# Patient Record
Sex: Female | Born: 1935
Health system: Southern US, Community
[De-identification: ages and names within clinical notes are randomized; demographics above are authoritative.]

## PROBLEM LIST (undated history)

## (undated) DIAGNOSIS — I10 Essential (primary) hypertension: Secondary | ICD-10-CM

## (undated) DIAGNOSIS — Z86010 Personal history of colonic polyps: Secondary | ICD-10-CM

## (undated) DIAGNOSIS — K559 Vascular disorder of intestine, unspecified: Secondary | ICD-10-CM

## (undated) DIAGNOSIS — T783XXA Angioneurotic edema, initial encounter: Secondary | ICD-10-CM

## (undated) DIAGNOSIS — K648 Other hemorrhoids: Secondary | ICD-10-CM

## (undated) DIAGNOSIS — K589 Irritable bowel syndrome without diarrhea: Secondary | ICD-10-CM

## (undated) DIAGNOSIS — J449 Chronic obstructive pulmonary disease, unspecified: Secondary | ICD-10-CM

## (undated) DIAGNOSIS — K219 Gastro-esophageal reflux disease without esophagitis: Secondary | ICD-10-CM

## (undated) DIAGNOSIS — Z86718 Personal history of other venous thrombosis and embolism: Secondary | ICD-10-CM

## (undated) DIAGNOSIS — J45909 Unspecified asthma, uncomplicated: Secondary | ICD-10-CM

## (undated) DIAGNOSIS — L309 Dermatitis, unspecified: Secondary | ICD-10-CM

## (undated) DIAGNOSIS — K5730 Diverticulosis of large intestine without perforation or abscess without bleeding: Secondary | ICD-10-CM

## (undated) DIAGNOSIS — M4800 Spinal stenosis, site unspecified: Secondary | ICD-10-CM

## (undated) DIAGNOSIS — L509 Urticaria, unspecified: Secondary | ICD-10-CM

## (undated) DIAGNOSIS — E119 Type 2 diabetes mellitus without complications: Secondary | ICD-10-CM

## (undated) DIAGNOSIS — E785 Hyperlipidemia, unspecified: Secondary | ICD-10-CM

## (undated) DIAGNOSIS — K5790 Diverticulosis of intestine, part unspecified, without perforation or abscess without bleeding: Secondary | ICD-10-CM

## (undated) HISTORY — PX: TONSILLECTOMY: SUR1361

## (undated) HISTORY — DX: Irritable bowel syndrome, unspecified: K58.9

## (undated) HISTORY — PX: SEPTOPLASTY: SUR1290

## (undated) HISTORY — DX: Chronic obstructive pulmonary disease, unspecified: J44.9

## (undated) HISTORY — DX: Essential (primary) hypertension: I10

## (undated) HISTORY — DX: Angioneurotic edema, initial encounter: T78.3XXA

## (undated) HISTORY — DX: Personal history of colonic polyps: Z86.010

## (undated) HISTORY — DX: Other hemorrhoids: K64.8

## (undated) HISTORY — DX: Hyperlipidemia, unspecified: E78.5

## (undated) HISTORY — DX: Vascular disorder of intestine, unspecified: K55.9

## (undated) HISTORY — DX: Diverticulosis of large intestine without perforation or abscess without bleeding: K57.30

## (undated) HISTORY — PX: ADENOIDECTOMY: SUR15

## (undated) HISTORY — PX: BREAST BIOPSY: SHX20

## (undated) HISTORY — DX: Spinal stenosis, site unspecified: M48.00

## (undated) HISTORY — DX: Type 2 diabetes mellitus without complications: E11.9

## (undated) HISTORY — DX: Dermatitis, unspecified: L30.9

## (undated) HISTORY — DX: Urticaria, unspecified: L50.9

## (undated) HISTORY — DX: Personal history of other venous thrombosis and embolism: Z86.718

## (undated) HISTORY — DX: Gastro-esophageal reflux disease without esophagitis: K21.9

## (undated) HISTORY — DX: Diverticulosis of intestine, part unspecified, without perforation or abscess without bleeding: K57.90

---

## 1997-12-14 ENCOUNTER — Other Ambulatory Visit: Admission: RE | Admit: 1997-12-14 | Discharge: 1997-12-14 | Payer: Self-pay | Admitting: Obstetrics and Gynecology

## 1998-07-29 HISTORY — PX: COLONOSCOPY W/ POLYPECTOMY: SHX1380

## 1998-11-27 ENCOUNTER — Other Ambulatory Visit: Admission: RE | Admit: 1998-11-27 | Discharge: 1998-11-27 | Payer: Self-pay | Admitting: Obstetrics and Gynecology

## 1999-06-25 ENCOUNTER — Emergency Department (HOSPITAL_COMMUNITY): Admission: EM | Admit: 1999-06-25 | Discharge: 1999-06-25 | Payer: Self-pay | Admitting: Emergency Medicine

## 2000-02-18 ENCOUNTER — Emergency Department (HOSPITAL_COMMUNITY): Admission: EM | Admit: 2000-02-18 | Discharge: 2000-02-18 | Payer: Self-pay | Admitting: Emergency Medicine

## 2000-02-20 ENCOUNTER — Other Ambulatory Visit: Admission: RE | Admit: 2000-02-20 | Discharge: 2000-02-20 | Payer: Self-pay | Admitting: Obstetrics and Gynecology

## 2000-04-16 ENCOUNTER — Emergency Department (HOSPITAL_COMMUNITY): Admission: EM | Admit: 2000-04-16 | Discharge: 2000-04-16 | Payer: Self-pay | Admitting: Emergency Medicine

## 2000-04-16 ENCOUNTER — Encounter: Payer: Self-pay | Admitting: Emergency Medicine

## 2000-12-09 ENCOUNTER — Other Ambulatory Visit: Admission: RE | Admit: 2000-12-09 | Discharge: 2000-12-09 | Payer: Self-pay | Admitting: Obstetrics and Gynecology

## 2001-08-24 ENCOUNTER — Encounter: Payer: Self-pay | Admitting: Cardiovascular Disease

## 2001-08-24 ENCOUNTER — Encounter: Payer: Self-pay | Admitting: Emergency Medicine

## 2001-08-24 ENCOUNTER — Inpatient Hospital Stay (HOSPITAL_COMMUNITY): Admission: EM | Admit: 2001-08-24 | Discharge: 2001-08-25 | Payer: Self-pay | Admitting: Emergency Medicine

## 2002-01-15 ENCOUNTER — Encounter: Payer: Self-pay | Admitting: Emergency Medicine

## 2002-01-15 ENCOUNTER — Emergency Department (HOSPITAL_COMMUNITY): Admission: EM | Admit: 2002-01-15 | Discharge: 2002-01-15 | Payer: Self-pay | Admitting: Emergency Medicine

## 2002-02-16 ENCOUNTER — Other Ambulatory Visit: Admission: RE | Admit: 2002-02-16 | Discharge: 2002-02-16 | Payer: Self-pay | Admitting: *Deleted

## 2002-04-01 ENCOUNTER — Encounter: Admission: RE | Admit: 2002-04-01 | Discharge: 2002-04-01 | Payer: Self-pay | Admitting: Internal Medicine

## 2002-04-01 ENCOUNTER — Encounter: Payer: Self-pay | Admitting: Internal Medicine

## 2003-01-08 ENCOUNTER — Emergency Department (HOSPITAL_COMMUNITY): Admission: AD | Admit: 2003-01-08 | Discharge: 2003-01-08 | Payer: Self-pay

## 2003-03-08 ENCOUNTER — Other Ambulatory Visit: Admission: RE | Admit: 2003-03-08 | Discharge: 2003-03-08 | Payer: Self-pay | Admitting: Gynecology

## 2004-03-08 ENCOUNTER — Other Ambulatory Visit: Admission: RE | Admit: 2004-03-08 | Discharge: 2004-03-08 | Payer: Self-pay | Admitting: Gynecology

## 2004-07-29 HISTORY — PX: COLONOSCOPY W/ POLYPECTOMY: SHX1380

## 2005-01-21 ENCOUNTER — Emergency Department (HOSPITAL_COMMUNITY): Admission: EM | Admit: 2005-01-21 | Discharge: 2005-01-21 | Payer: Self-pay | Admitting: Emergency Medicine

## 2005-03-11 ENCOUNTER — Other Ambulatory Visit: Admission: RE | Admit: 2005-03-11 | Discharge: 2005-03-11 | Payer: Self-pay | Admitting: Gynecology

## 2005-03-29 HISTORY — PX: DILATION AND CURETTAGE OF UTERUS: SHX78

## 2005-04-15 ENCOUNTER — Ambulatory Visit: Payer: Self-pay | Admitting: Internal Medicine

## 2005-04-15 ENCOUNTER — Encounter: Admission: RE | Admit: 2005-04-15 | Discharge: 2005-04-15 | Payer: Self-pay | Admitting: Gynecology

## 2005-04-16 ENCOUNTER — Ambulatory Visit (HOSPITAL_COMMUNITY): Admission: RE | Admit: 2005-04-16 | Discharge: 2005-04-16 | Payer: Self-pay | Admitting: Gynecology

## 2005-04-16 ENCOUNTER — Encounter (INDEPENDENT_AMBULATORY_CARE_PROVIDER_SITE_OTHER): Payer: Self-pay | Admitting: *Deleted

## 2005-04-16 ENCOUNTER — Ambulatory Visit (HOSPITAL_BASED_OUTPATIENT_CLINIC_OR_DEPARTMENT_OTHER): Admission: RE | Admit: 2005-04-16 | Discharge: 2005-04-16 | Payer: Self-pay | Admitting: Gynecology

## 2005-05-02 ENCOUNTER — Ambulatory Visit: Payer: Self-pay | Admitting: Gastroenterology

## 2005-06-05 ENCOUNTER — Encounter (INDEPENDENT_AMBULATORY_CARE_PROVIDER_SITE_OTHER): Payer: Self-pay | Admitting: *Deleted

## 2005-06-05 ENCOUNTER — Emergency Department (HOSPITAL_COMMUNITY): Admission: EM | Admit: 2005-06-05 | Discharge: 2005-06-06 | Payer: Self-pay | Admitting: Emergency Medicine

## 2005-06-05 ENCOUNTER — Ambulatory Visit: Payer: Self-pay | Admitting: Gastroenterology

## 2005-06-05 DIAGNOSIS — Z860100 Personal history of colon polyps, unspecified: Secondary | ICD-10-CM

## 2005-06-05 HISTORY — DX: Personal history of colon polyps, unspecified: Z86.0100

## 2005-06-05 HISTORY — DX: Personal history of colonic polyps: Z86.010

## 2005-06-07 ENCOUNTER — Ambulatory Visit: Payer: Self-pay | Admitting: Gastroenterology

## 2005-06-07 ENCOUNTER — Emergency Department (HOSPITAL_COMMUNITY): Admission: EM | Admit: 2005-06-07 | Discharge: 2005-06-07 | Payer: Self-pay | Admitting: Emergency Medicine

## 2005-07-12 ENCOUNTER — Ambulatory Visit: Payer: Self-pay | Admitting: Internal Medicine

## 2005-07-24 ENCOUNTER — Encounter: Admission: RE | Admit: 2005-07-24 | Discharge: 2005-10-22 | Payer: Self-pay | Admitting: Internal Medicine

## 2005-07-29 HISTORY — PX: NEPHRECTOMY: SHX65

## 2005-11-18 ENCOUNTER — Ambulatory Visit: Payer: Self-pay | Admitting: Internal Medicine

## 2005-11-26 ENCOUNTER — Ambulatory Visit: Payer: Self-pay | Admitting: Internal Medicine

## 2006-03-31 ENCOUNTER — Emergency Department (HOSPITAL_COMMUNITY): Admission: EM | Admit: 2006-03-31 | Discharge: 2006-03-31 | Payer: Self-pay | Admitting: Emergency Medicine

## 2006-04-04 ENCOUNTER — Ambulatory Visit: Payer: Self-pay | Admitting: Internal Medicine

## 2006-04-07 ENCOUNTER — Ambulatory Visit: Payer: Self-pay | Admitting: Internal Medicine

## 2006-04-10 ENCOUNTER — Encounter: Admission: RE | Admit: 2006-04-10 | Discharge: 2006-04-10 | Payer: Self-pay | Admitting: Internal Medicine

## 2006-04-15 ENCOUNTER — Other Ambulatory Visit: Admission: RE | Admit: 2006-04-15 | Discharge: 2006-04-15 | Payer: Self-pay | Admitting: Gynecology

## 2006-04-17 ENCOUNTER — Ambulatory Visit: Payer: Self-pay | Admitting: Internal Medicine

## 2006-04-18 ENCOUNTER — Encounter: Admission: RE | Admit: 2006-04-18 | Discharge: 2006-04-18 | Payer: Self-pay | Admitting: Internal Medicine

## 2006-04-27 ENCOUNTER — Emergency Department (HOSPITAL_COMMUNITY): Admission: EM | Admit: 2006-04-27 | Discharge: 2006-04-27 | Payer: Self-pay | Admitting: *Deleted

## 2006-05-02 ENCOUNTER — Ambulatory Visit (HOSPITAL_COMMUNITY): Admission: RE | Admit: 2006-05-02 | Discharge: 2006-05-02 | Payer: Self-pay | Admitting: Urology

## 2006-06-09 ENCOUNTER — Ambulatory Visit: Payer: Self-pay | Admitting: Pulmonary Disease

## 2006-06-09 ENCOUNTER — Inpatient Hospital Stay (HOSPITAL_COMMUNITY): Admission: RE | Admit: 2006-06-09 | Discharge: 2006-06-14 | Payer: Self-pay | Admitting: Urology

## 2006-06-09 ENCOUNTER — Encounter (INDEPENDENT_AMBULATORY_CARE_PROVIDER_SITE_OTHER): Payer: Self-pay | Admitting: Specialist

## 2006-06-15 ENCOUNTER — Inpatient Hospital Stay (HOSPITAL_COMMUNITY): Admission: EM | Admit: 2006-06-15 | Discharge: 2006-06-21 | Payer: Self-pay | Admitting: Emergency Medicine

## 2006-06-16 ENCOUNTER — Ambulatory Visit: Payer: Self-pay | Admitting: Internal Medicine

## 2006-06-23 ENCOUNTER — Inpatient Hospital Stay (HOSPITAL_COMMUNITY): Admission: EM | Admit: 2006-06-23 | Discharge: 2006-06-28 | Payer: Self-pay | Admitting: Emergency Medicine

## 2006-06-24 ENCOUNTER — Encounter (INDEPENDENT_AMBULATORY_CARE_PROVIDER_SITE_OTHER): Payer: Self-pay | Admitting: Specialist

## 2006-07-01 ENCOUNTER — Ambulatory Visit: Payer: Self-pay | Admitting: Internal Medicine

## 2006-07-11 ENCOUNTER — Ambulatory Visit: Payer: Self-pay | Admitting: Internal Medicine

## 2006-07-15 ENCOUNTER — Ambulatory Visit: Payer: Self-pay | Admitting: Internal Medicine

## 2006-08-05 ENCOUNTER — Ambulatory Visit: Payer: Self-pay | Admitting: Internal Medicine

## 2006-08-05 LAB — CONVERTED CEMR LAB
Basophils Relative: 1 % (ref 0.0–1.0)
Eosinophil percent: 7.1 % — ABNORMAL HIGH (ref 0.0–5.0)
HCT: 44.1 % (ref 36.0–46.0)
Neutrophils Relative %: 54.9 % (ref 43.0–77.0)
Platelets: 392 10*3/uL (ref 150–400)
Potassium: 4.4 meq/L (ref 3.5–5.1)
RBC: 5.05 M/uL (ref 3.87–5.11)
RDW: 14.9 % — ABNORMAL HIGH (ref 11.5–14.6)
WBC: 6.6 10*3/uL (ref 4.5–10.5)

## 2006-08-26 ENCOUNTER — Ambulatory Visit: Payer: Self-pay | Admitting: Internal Medicine

## 2006-09-02 ENCOUNTER — Emergency Department (HOSPITAL_COMMUNITY): Admission: EM | Admit: 2006-09-02 | Discharge: 2006-09-02 | Payer: Self-pay | Admitting: Emergency Medicine

## 2006-09-03 DIAGNOSIS — K589 Irritable bowel syndrome without diarrhea: Secondary | ICD-10-CM

## 2006-09-03 DIAGNOSIS — I10 Essential (primary) hypertension: Secondary | ICD-10-CM | POA: Insufficient documentation

## 2006-09-03 DIAGNOSIS — E785 Hyperlipidemia, unspecified: Secondary | ICD-10-CM | POA: Insufficient documentation

## 2006-09-03 DIAGNOSIS — K5730 Diverticulosis of large intestine without perforation or abscess without bleeding: Secondary | ICD-10-CM | POA: Insufficient documentation

## 2006-09-16 ENCOUNTER — Ambulatory Visit: Payer: Self-pay | Admitting: Internal Medicine

## 2006-10-01 ENCOUNTER — Ambulatory Visit: Payer: Self-pay | Admitting: Internal Medicine

## 2006-10-15 ENCOUNTER — Ambulatory Visit: Payer: Self-pay | Admitting: Internal Medicine

## 2006-11-06 IMAGING — CT CT HEAD W/O CM
1 series · 16 of 30 positions shown, 20 images · IV contrast (agent unspecified)
Comparison: 01/21/2005

CLINICAL DATA: Left-sided numbness

HEAD CT WITHOUT CONTRAST:
TECHNIQUE: 5mm collimated images were obtained from the base of the skull
through the vertex according to standard protocol without contrast.

[Series 2: head_seq 4.5 h45s st · axial · 0.43mm/px · z∈[+1224,+1350]mm · 16 of 32 slices shown, 20 images]
[im 2/32  brain]
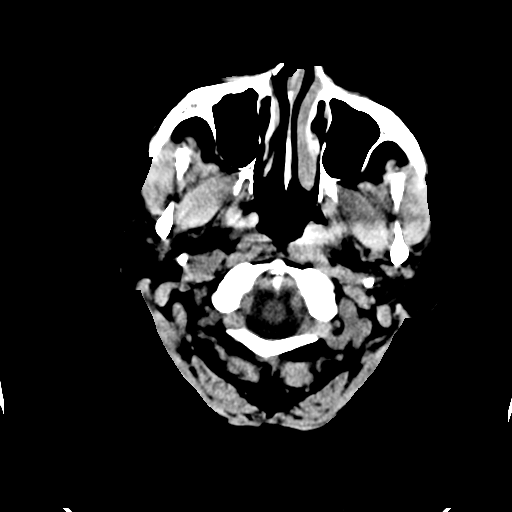
[im 2/32  bone]
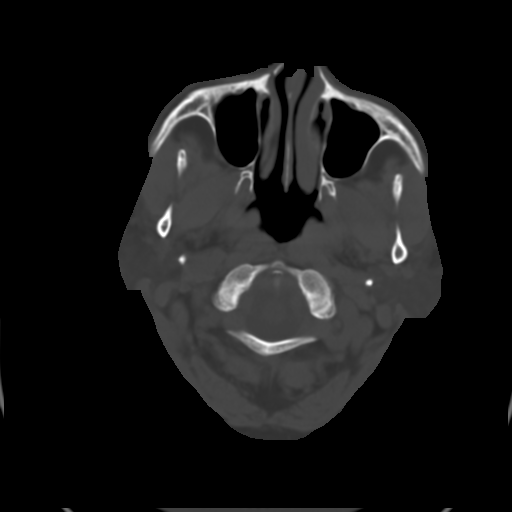
[im 4/32  brain]
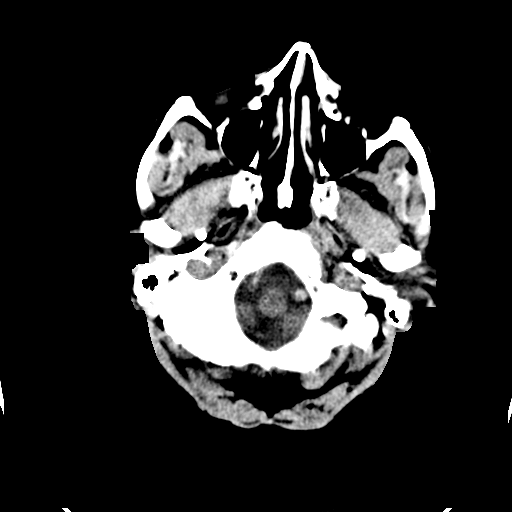
[im 6/32  brain]
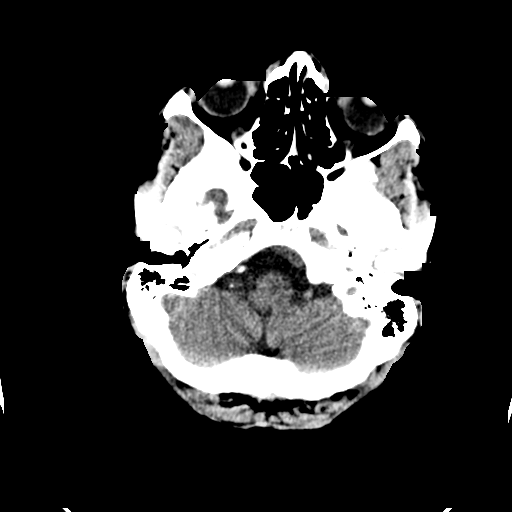
[im 8/32  brain]
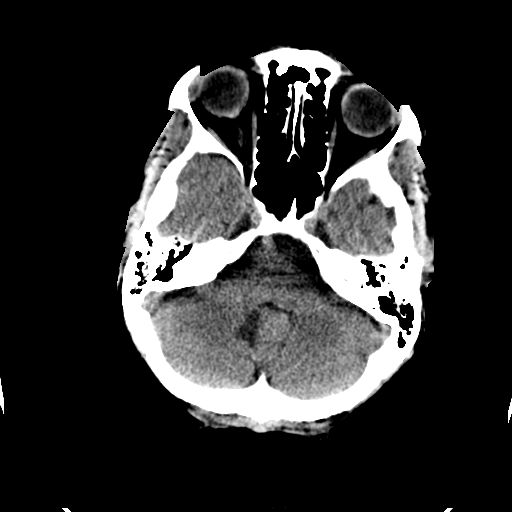
[im 9/32  brain]
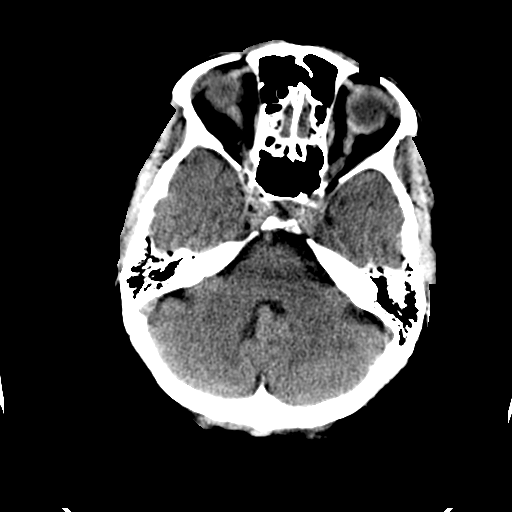
[im 9/32  bone]
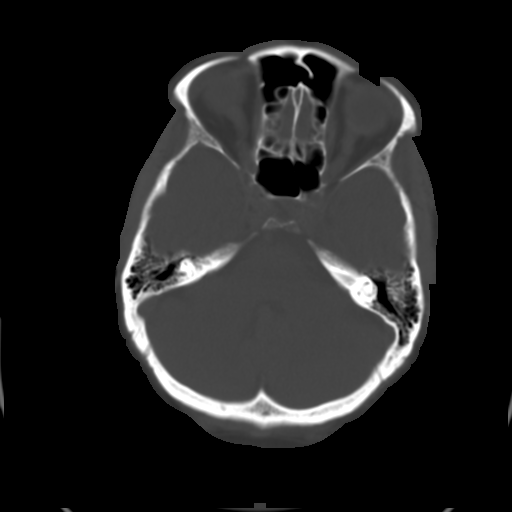
[im 11/32  brain]
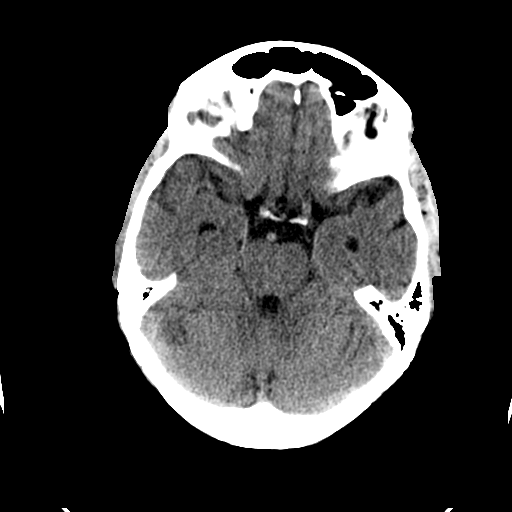
[im 13/32  brain]
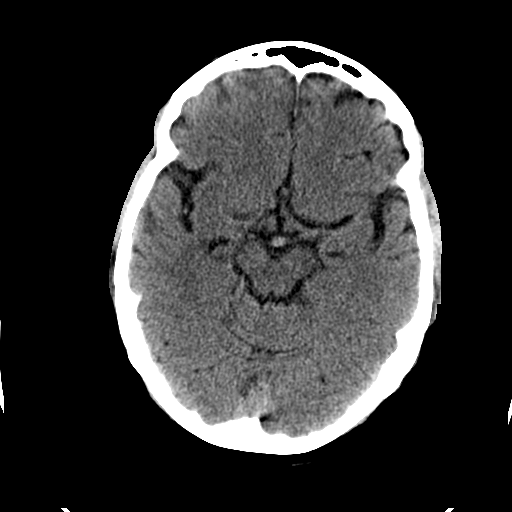
[im 15/32  brain]
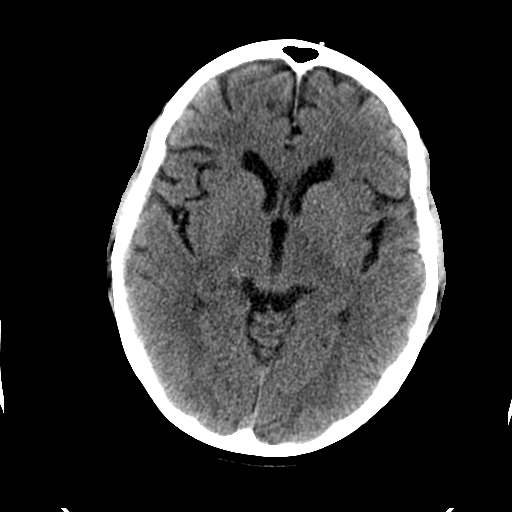
[im 17/32  brain]
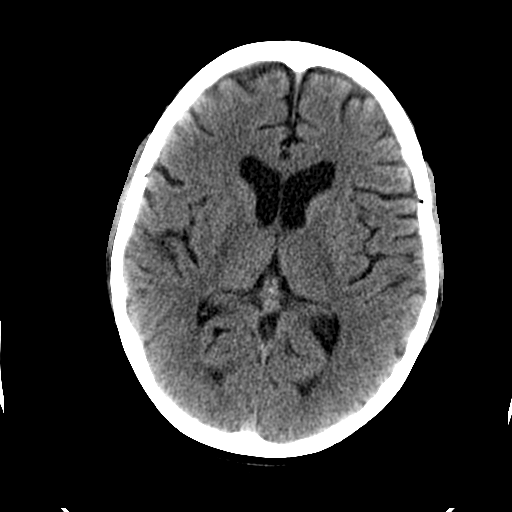
[im 17/32  bone]
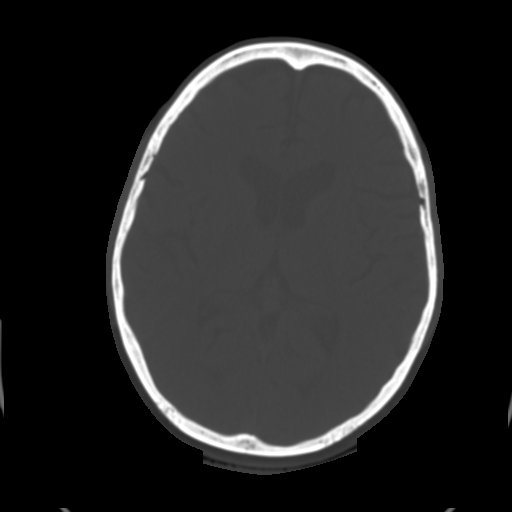
[im 19/32  brain]
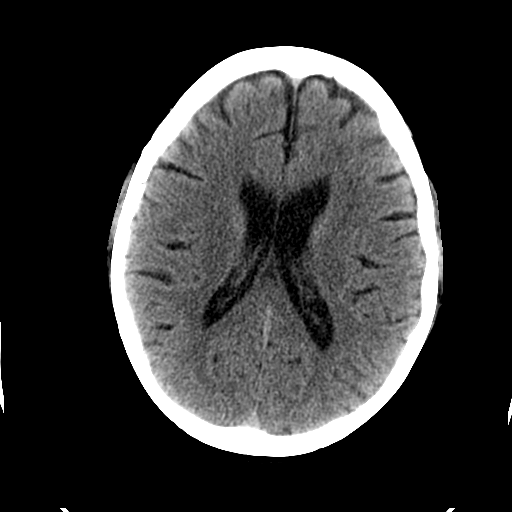
[im 21/32  brain]
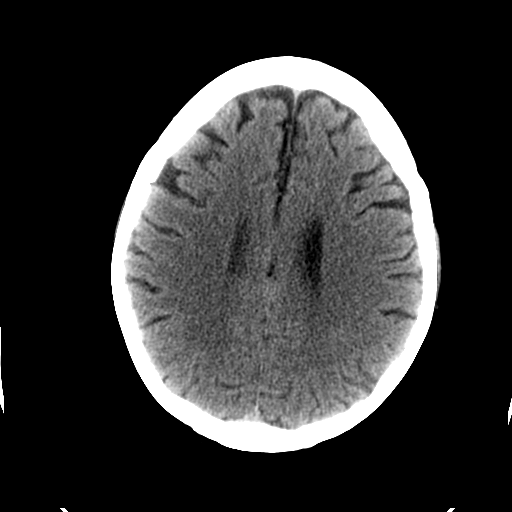
[im 23/32  brain]
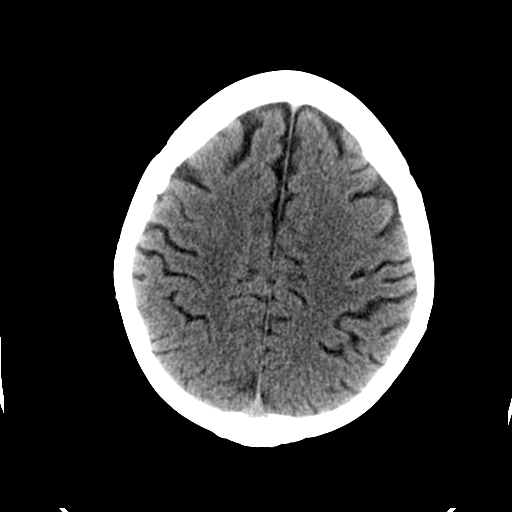
[im 24/32  brain]
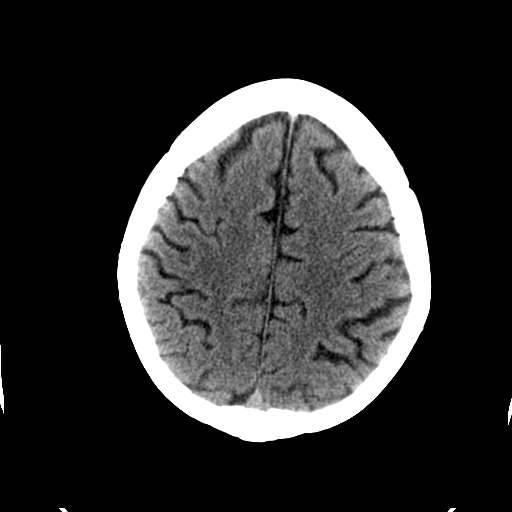
[im 24/32  bone]
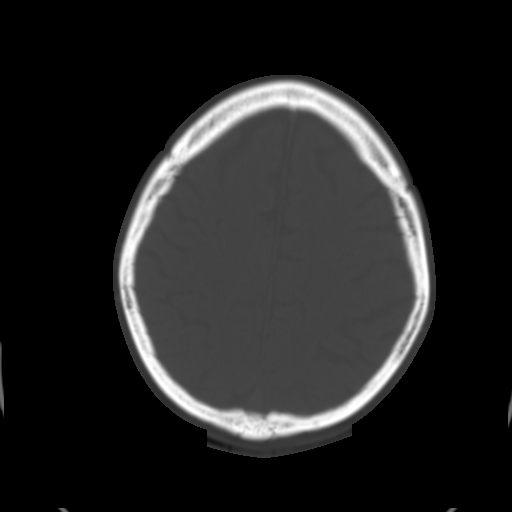
[im 26/32  brain]
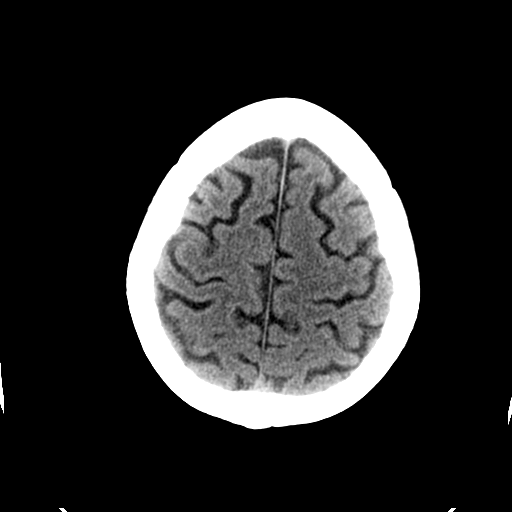
[im 28/32  brain]
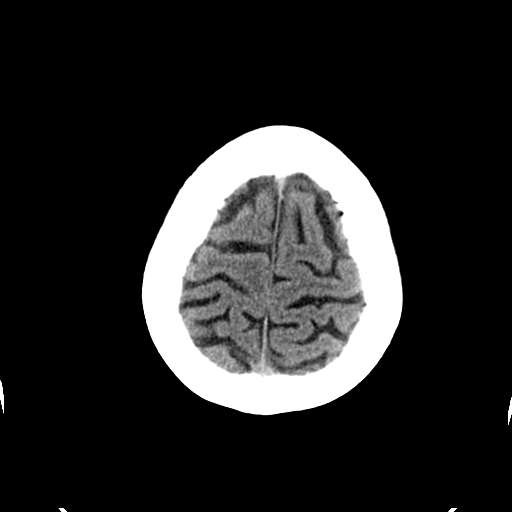
[im 30/32  brain]
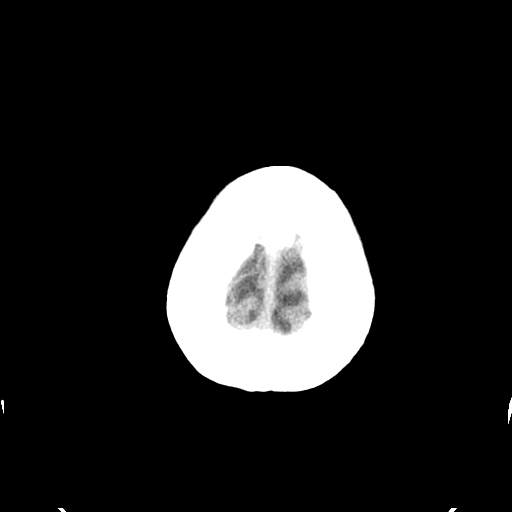

[16 of 30 positions shown; findings below may reference images not displayed]

FINDINGS: There is no evidence of intracranial hemorrhage, hydrocephalus, mass
lesion, or acute infarction.  No abnormal extra-axial fluid collections
identified.  No skull abnormalities are noted.
IMPRESSION: No acute intracranial abnormality.

## 2006-11-09 ENCOUNTER — Ambulatory Visit: Payer: Self-pay | Admitting: Internal Medicine

## 2006-11-09 ENCOUNTER — Inpatient Hospital Stay (HOSPITAL_COMMUNITY): Admission: EM | Admit: 2006-11-09 | Discharge: 2006-11-11 | Payer: Self-pay | Admitting: Emergency Medicine

## 2006-11-18 ENCOUNTER — Ambulatory Visit: Payer: Self-pay | Admitting: Gastroenterology

## 2006-11-18 ENCOUNTER — Ambulatory Visit: Payer: Self-pay | Admitting: Internal Medicine

## 2006-12-23 ENCOUNTER — Ambulatory Visit: Payer: Self-pay | Admitting: Internal Medicine

## 2006-12-25 ENCOUNTER — Encounter: Payer: Self-pay | Admitting: Internal Medicine

## 2007-01-23 ENCOUNTER — Encounter: Payer: Self-pay | Admitting: Internal Medicine

## 2007-03-02 ENCOUNTER — Ambulatory Visit: Payer: Self-pay | Admitting: Internal Medicine

## 2007-03-02 DIAGNOSIS — M2550 Pain in unspecified joint: Secondary | ICD-10-CM | POA: Insufficient documentation

## 2007-03-17 ENCOUNTER — Ambulatory Visit (HOSPITAL_COMMUNITY): Admission: RE | Admit: 2007-03-17 | Discharge: 2007-03-17 | Payer: Self-pay | Admitting: Urology

## 2007-03-24 ENCOUNTER — Encounter: Payer: Self-pay | Admitting: Internal Medicine

## 2007-04-02 ENCOUNTER — Ambulatory Visit: Payer: Self-pay | Admitting: Internal Medicine

## 2007-04-02 ENCOUNTER — Observation Stay (HOSPITAL_COMMUNITY): Admission: EM | Admit: 2007-04-02 | Discharge: 2007-04-04 | Payer: Self-pay | Admitting: Emergency Medicine

## 2007-04-07 ENCOUNTER — Ambulatory Visit: Payer: Self-pay | Admitting: Internal Medicine

## 2007-04-08 LAB — CONVERTED CEMR LAB
Sed Rate: 11 mm/hr (ref 0–25)
Uric Acid, Serum: 6.7 mg/dL (ref 2.4–7.0)

## 2007-04-09 ENCOUNTER — Encounter (INDEPENDENT_AMBULATORY_CARE_PROVIDER_SITE_OTHER): Payer: Self-pay | Admitting: *Deleted

## 2007-04-17 ENCOUNTER — Telehealth (INDEPENDENT_AMBULATORY_CARE_PROVIDER_SITE_OTHER): Payer: Self-pay | Admitting: *Deleted

## 2007-04-22 ENCOUNTER — Other Ambulatory Visit: Admission: RE | Admit: 2007-04-22 | Discharge: 2007-04-22 | Payer: Self-pay | Admitting: Gynecology

## 2007-04-22 ENCOUNTER — Encounter: Payer: Self-pay | Admitting: Internal Medicine

## 2007-06-02 ENCOUNTER — Encounter: Payer: Self-pay | Admitting: Internal Medicine

## 2007-06-19 ENCOUNTER — Telehealth (INDEPENDENT_AMBULATORY_CARE_PROVIDER_SITE_OTHER): Payer: Self-pay | Admitting: *Deleted

## 2007-06-29 ENCOUNTER — Telehealth (INDEPENDENT_AMBULATORY_CARE_PROVIDER_SITE_OTHER): Payer: Self-pay | Admitting: *Deleted

## 2007-07-02 ENCOUNTER — Ambulatory Visit: Payer: Self-pay | Admitting: Internal Medicine

## 2007-07-10 ENCOUNTER — Telehealth: Payer: Self-pay | Admitting: *Deleted

## 2007-07-14 ENCOUNTER — Telehealth: Payer: Self-pay | Admitting: Internal Medicine

## 2007-07-14 ENCOUNTER — Encounter: Payer: Self-pay | Admitting: Internal Medicine

## 2007-07-15 ENCOUNTER — Encounter: Payer: Self-pay | Admitting: Internal Medicine

## 2007-07-16 ENCOUNTER — Telehealth: Payer: Self-pay | Admitting: Internal Medicine

## 2007-07-17 ENCOUNTER — Telehealth: Payer: Self-pay | Admitting: Internal Medicine

## 2007-07-17 ENCOUNTER — Telehealth (INDEPENDENT_AMBULATORY_CARE_PROVIDER_SITE_OTHER): Payer: Self-pay | Admitting: *Deleted

## 2007-07-21 ENCOUNTER — Telehealth (INDEPENDENT_AMBULATORY_CARE_PROVIDER_SITE_OTHER): Payer: Self-pay | Admitting: *Deleted

## 2007-07-24 ENCOUNTER — Ambulatory Visit: Payer: Self-pay | Admitting: Internal Medicine

## 2007-07-27 ENCOUNTER — Encounter (INDEPENDENT_AMBULATORY_CARE_PROVIDER_SITE_OTHER): Payer: Self-pay | Admitting: *Deleted

## 2007-07-27 LAB — CONVERTED CEMR LAB: Hgb A1c MFr Bld: 6.1 % — ABNORMAL HIGH (ref 4.6–6.0)

## 2007-07-28 ENCOUNTER — Telehealth (INDEPENDENT_AMBULATORY_CARE_PROVIDER_SITE_OTHER): Payer: Self-pay | Admitting: *Deleted

## 2007-07-28 ENCOUNTER — Encounter: Payer: Self-pay | Admitting: Internal Medicine

## 2007-07-29 ENCOUNTER — Telehealth (INDEPENDENT_AMBULATORY_CARE_PROVIDER_SITE_OTHER): Payer: Self-pay | Admitting: *Deleted

## 2007-08-06 ENCOUNTER — Ambulatory Visit: Payer: Self-pay | Admitting: Internal Medicine

## 2007-08-06 DIAGNOSIS — C649 Malignant neoplasm of unspecified kidney, except renal pelvis: Secondary | ICD-10-CM

## 2007-08-06 DIAGNOSIS — Z86718 Personal history of other venous thrombosis and embolism: Secondary | ICD-10-CM

## 2007-08-06 DIAGNOSIS — E119 Type 2 diabetes mellitus without complications: Secondary | ICD-10-CM | POA: Insufficient documentation

## 2007-08-06 DIAGNOSIS — K219 Gastro-esophageal reflux disease without esophagitis: Secondary | ICD-10-CM | POA: Insufficient documentation

## 2007-09-29 ENCOUNTER — Ambulatory Visit (HOSPITAL_COMMUNITY): Admission: RE | Admit: 2007-09-29 | Discharge: 2007-09-29 | Payer: Self-pay | Admitting: Urology

## 2007-09-29 ENCOUNTER — Encounter: Payer: Self-pay | Admitting: Internal Medicine

## 2007-10-06 ENCOUNTER — Encounter: Payer: Self-pay | Admitting: Internal Medicine

## 2007-10-14 ENCOUNTER — Telehealth (INDEPENDENT_AMBULATORY_CARE_PROVIDER_SITE_OTHER): Payer: Self-pay | Admitting: *Deleted

## 2007-10-29 ENCOUNTER — Telehealth (INDEPENDENT_AMBULATORY_CARE_PROVIDER_SITE_OTHER): Payer: Self-pay | Admitting: *Deleted

## 2007-11-08 IMAGING — CT CT CHEST W/O CM
1 series · 16 of 33 positions shown, 20 images · IV contrast (agent unspecified)
Comparison: Plain film 04/02/07.

CLINICAL DATA: Chest pain.  Back pain.  Question dissection?
 CHEST CT WITHOUT CONTRAST:
TECHNIQUE: Multidetector CT imaging of the chest was performed following the standard protocol without IV contrast.

[Series 2: chest_routine 5.0 b40f st · axial · 0.61mm/px · z∈[+300,+594]mm · 16 of 65 slices shown, 20 images]
[im 3/65  mediastinal]
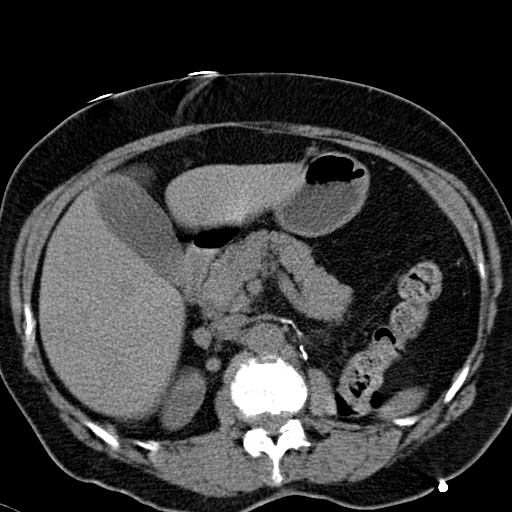
[im 3/65  lung]
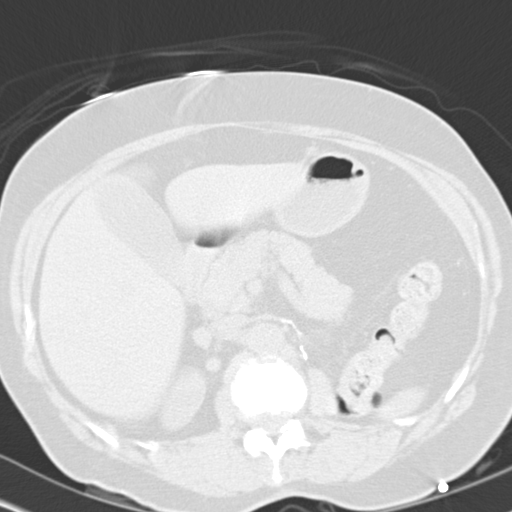
[im 8/65  lung]
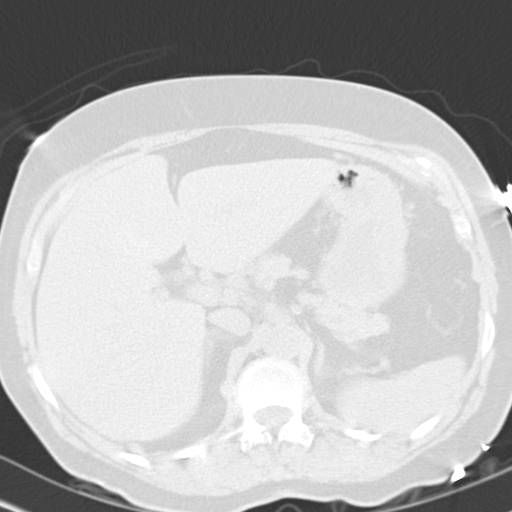
[im 12/65  lung]
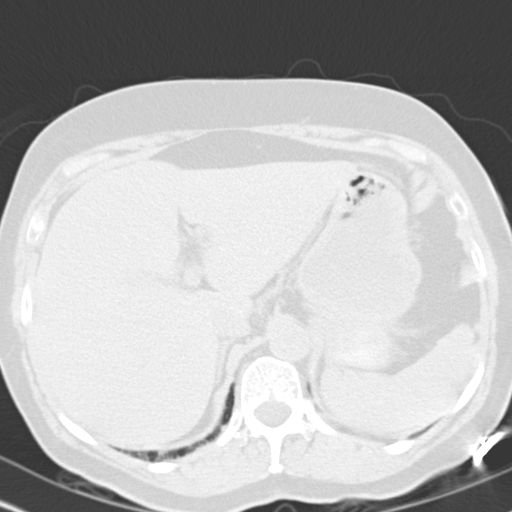
[im 15/65  lung]
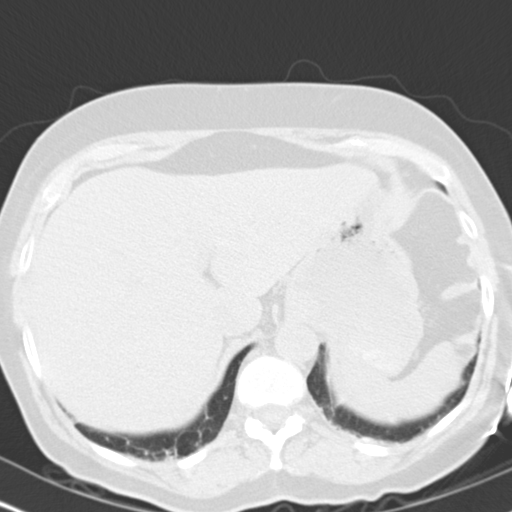
[im 19/65  mediastinal]
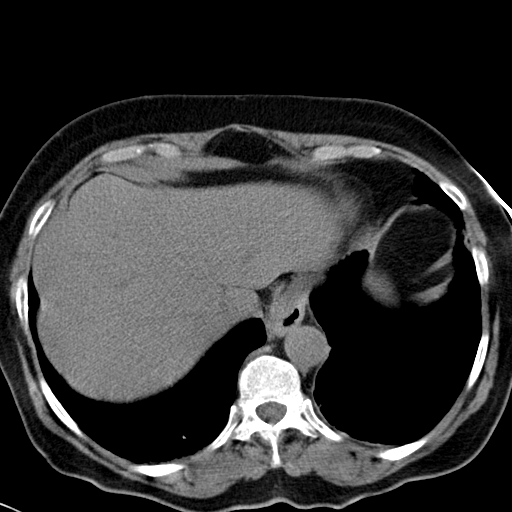
[im 19/65  lung]
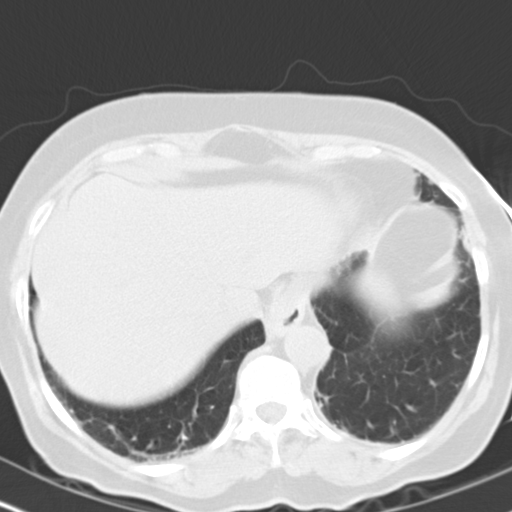
[im 24/65  lung]
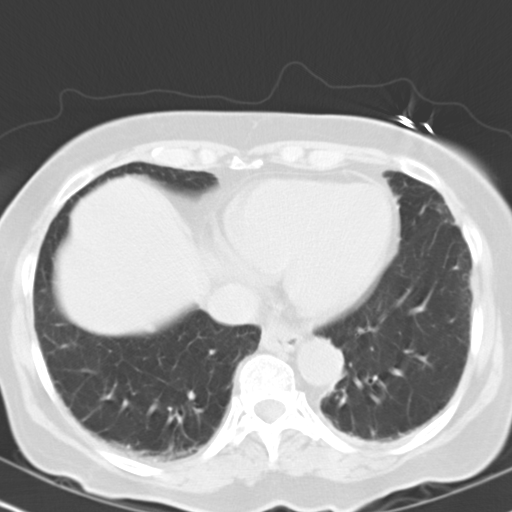
[im 27/65  lung]
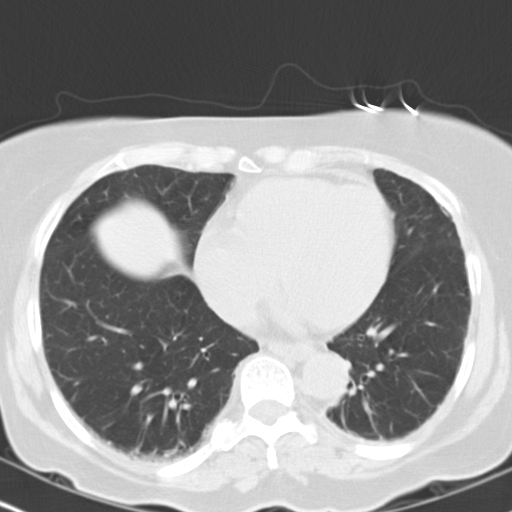
[im 31/65  lung]
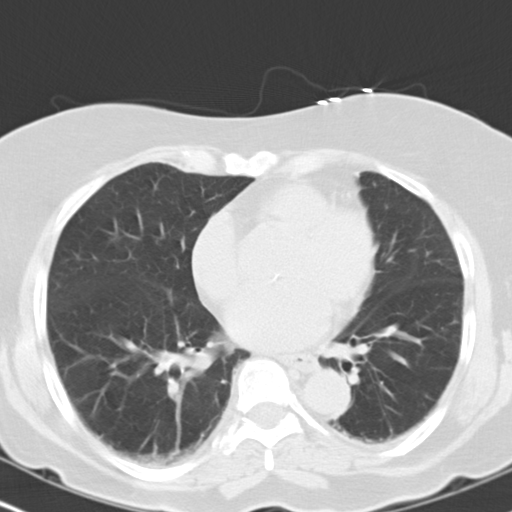
[im 35/65  mediastinal]
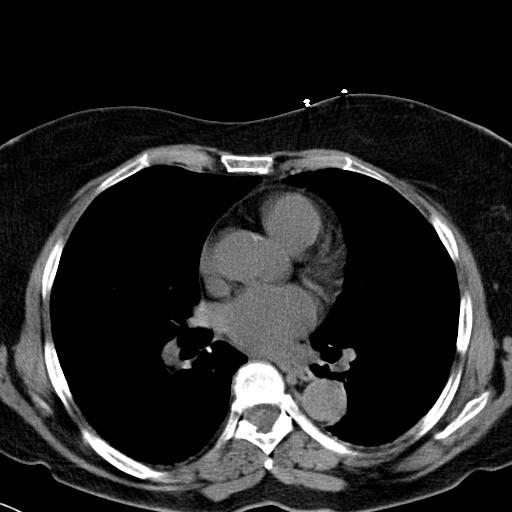
[im 35/65  lung]
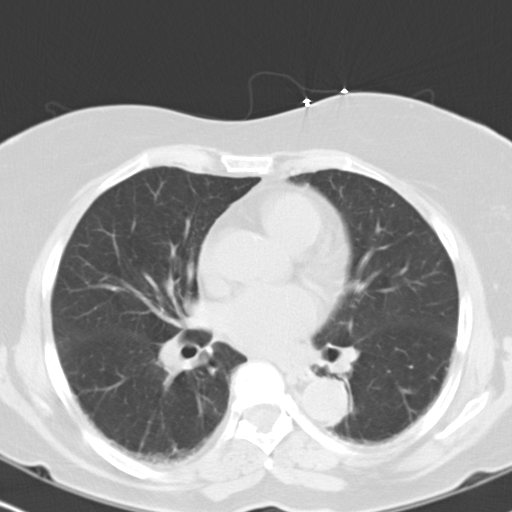
[im 38/65  lung]
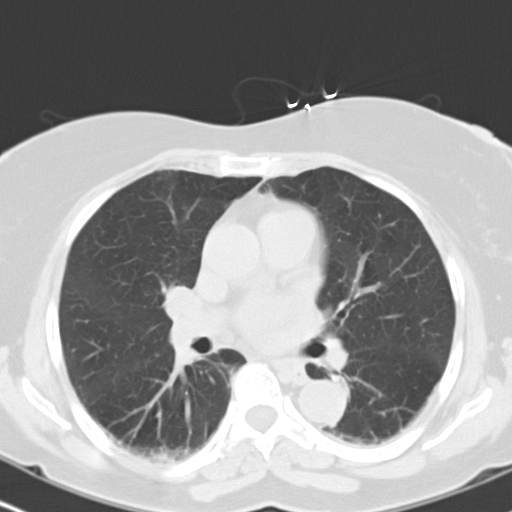
[im 41/65  lung]
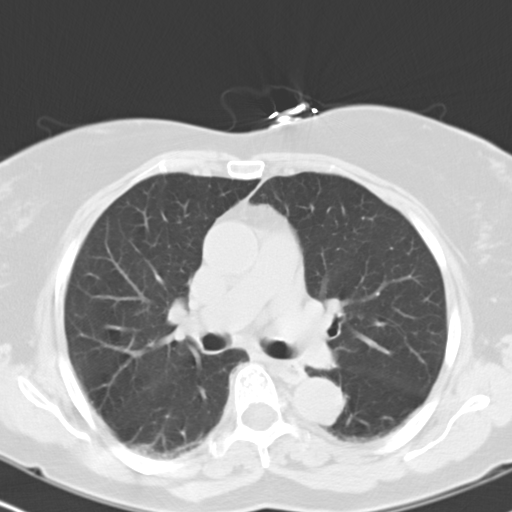
[im 46/65  lung]
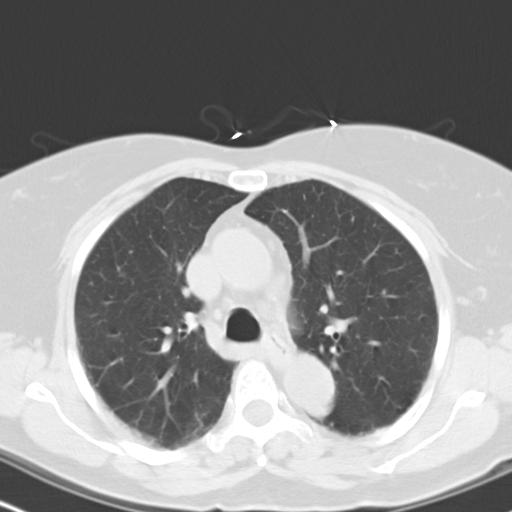
[im 50/65  mediastinal]
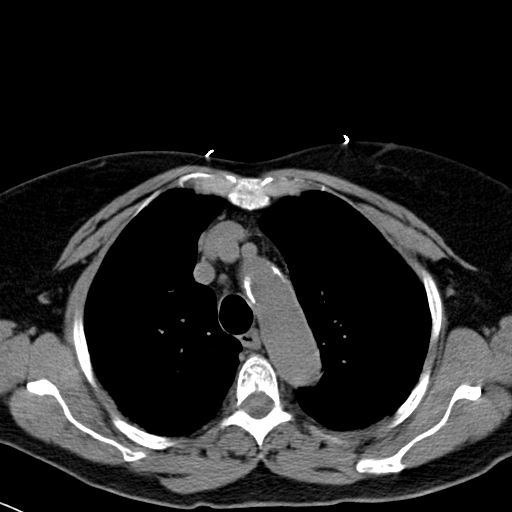
[im 50/65  lung]
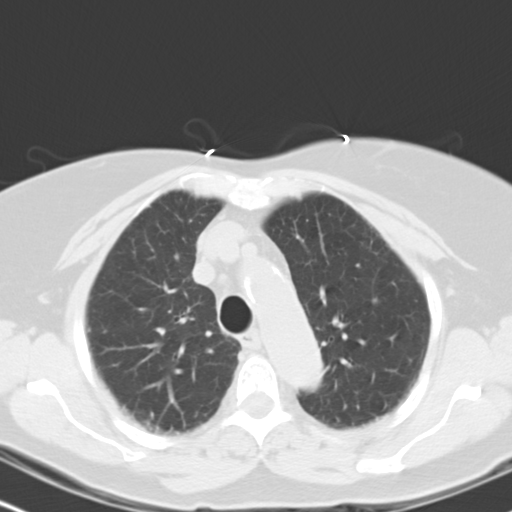
[im 53/65  lung]
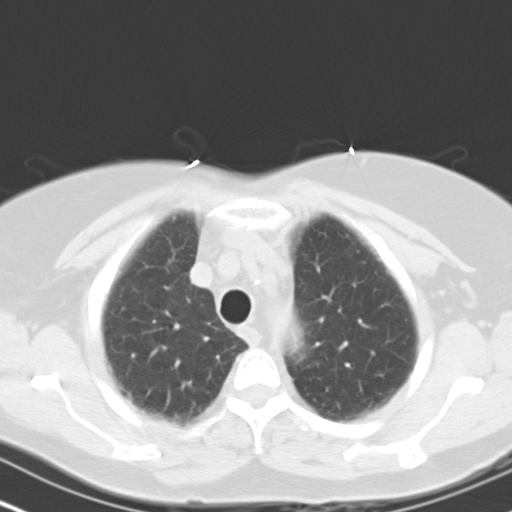
[im 57/65  lung]
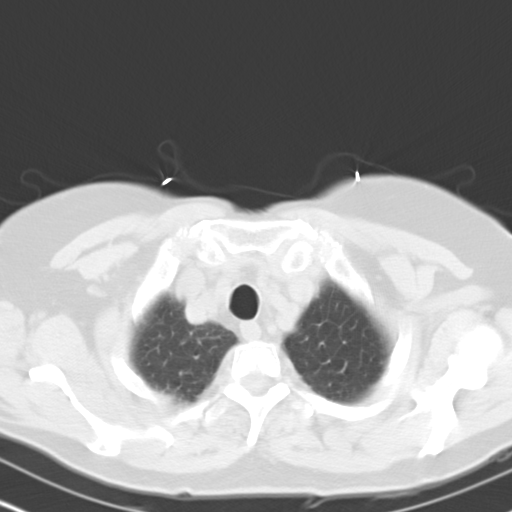
[im 62/65  lung]
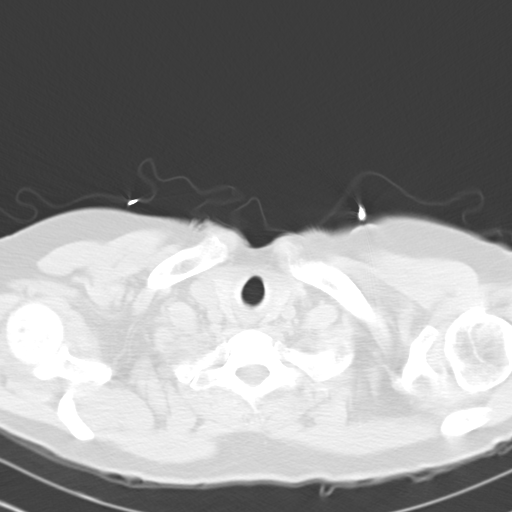

[16 of 33 positions shown; findings below may reference images not displayed]

FINDINGS: Lack of contrast material somewhat limits the study.  Contrast cannot be given due to the patient?s elevated creatinine.  The aorta demonstrates normal caliber throughout with some mild scattered atherosclerotic calcification.  No evidence of dissection is identified.  No pleural or pericardial effusion is present.  The patient has a small hiatal hernia.  A few small mediastinal lymph nodes are noted but no pathologic lymphadenopathy by CT size criteria is seen in the mediastinum, hila or axilla.  Lungs demonstrate dependent atelectatic but are otherwise unremarkable.  Airway is unremarkable.  Incidentally imaged upper abdomen is unremarkable.  No focal bony abnormality.
IMPRESSION: Negative for evidence of aortic dissection on uninfused scan.  No acute finding is identified.

## 2007-12-07 ENCOUNTER — Ambulatory Visit: Payer: Self-pay | Admitting: Internal Medicine

## 2007-12-07 DIAGNOSIS — E1142 Type 2 diabetes mellitus with diabetic polyneuropathy: Secondary | ICD-10-CM | POA: Insufficient documentation

## 2007-12-07 DIAGNOSIS — R930 Abnormal findings on diagnostic imaging of skull and head, not elsewhere classified: Secondary | ICD-10-CM

## 2007-12-07 LAB — CONVERTED CEMR LAB

## 2007-12-14 LAB — CONVERTED CEMR LAB
TSH: 1.2 microintl units/mL (ref 0.35–5.50)
Vitamin B-12: 791 pg/mL (ref 211–911)

## 2007-12-17 ENCOUNTER — Encounter: Payer: Self-pay | Admitting: Internal Medicine

## 2007-12-18 ENCOUNTER — Encounter (INDEPENDENT_AMBULATORY_CARE_PROVIDER_SITE_OTHER): Payer: Self-pay | Admitting: *Deleted

## 2007-12-18 ENCOUNTER — Encounter: Payer: Self-pay | Admitting: Internal Medicine

## 2008-01-18 ENCOUNTER — Telehealth (INDEPENDENT_AMBULATORY_CARE_PROVIDER_SITE_OTHER): Payer: Self-pay | Admitting: *Deleted

## 2008-01-18 ENCOUNTER — Ambulatory Visit: Payer: Self-pay | Admitting: Internal Medicine

## 2008-01-20 ENCOUNTER — Encounter: Payer: Self-pay | Admitting: Internal Medicine

## 2008-01-20 ENCOUNTER — Encounter (INDEPENDENT_AMBULATORY_CARE_PROVIDER_SITE_OTHER): Payer: Self-pay | Admitting: *Deleted

## 2008-02-15 ENCOUNTER — Ambulatory Visit: Payer: Self-pay | Admitting: Internal Medicine

## 2008-02-22 ENCOUNTER — Telehealth (INDEPENDENT_AMBULATORY_CARE_PROVIDER_SITE_OTHER): Payer: Self-pay | Admitting: *Deleted

## 2008-02-22 LAB — CONVERTED CEMR LAB: Hgb A1c MFr Bld: 6.4 % — ABNORMAL HIGH (ref 4.6–6.0)

## 2008-02-23 ENCOUNTER — Encounter: Payer: Self-pay | Admitting: Internal Medicine

## 2008-03-01 ENCOUNTER — Encounter: Payer: Self-pay | Admitting: Internal Medicine

## 2008-04-13 ENCOUNTER — Encounter: Payer: Self-pay | Admitting: Internal Medicine

## 2008-04-15 ENCOUNTER — Telehealth (INDEPENDENT_AMBULATORY_CARE_PROVIDER_SITE_OTHER): Payer: Self-pay | Admitting: *Deleted

## 2008-04-26 ENCOUNTER — Encounter: Payer: Self-pay | Admitting: Internal Medicine

## 2008-05-03 ENCOUNTER — Ambulatory Visit: Payer: Self-pay | Admitting: Internal Medicine

## 2008-05-03 DIAGNOSIS — F329 Major depressive disorder, single episode, unspecified: Secondary | ICD-10-CM

## 2008-05-03 DIAGNOSIS — F32A Depression, unspecified: Secondary | ICD-10-CM | POA: Insufficient documentation

## 2008-05-03 DIAGNOSIS — S8410XA Injury of peroneal nerve at lower leg level, unspecified leg, initial encounter: Secondary | ICD-10-CM | POA: Insufficient documentation

## 2008-05-10 ENCOUNTER — Telehealth (INDEPENDENT_AMBULATORY_CARE_PROVIDER_SITE_OTHER): Payer: Self-pay | Admitting: *Deleted

## 2008-06-03 ENCOUNTER — Telehealth (INDEPENDENT_AMBULATORY_CARE_PROVIDER_SITE_OTHER): Payer: Self-pay | Admitting: *Deleted

## 2008-06-03 ENCOUNTER — Ambulatory Visit: Payer: Self-pay | Admitting: Internal Medicine

## 2008-06-03 DIAGNOSIS — T887XXA Unspecified adverse effect of drug or medicament, initial encounter: Secondary | ICD-10-CM

## 2008-06-08 ENCOUNTER — Encounter (INDEPENDENT_AMBULATORY_CARE_PROVIDER_SITE_OTHER): Payer: Self-pay | Admitting: *Deleted

## 2008-06-08 ENCOUNTER — Telehealth (INDEPENDENT_AMBULATORY_CARE_PROVIDER_SITE_OTHER): Payer: Self-pay | Admitting: *Deleted

## 2008-06-08 LAB — CONVERTED CEMR LAB: Creatinine, Ser: 1.3 mg/dL — ABNORMAL HIGH (ref 0.4–1.2)

## 2008-06-17 ENCOUNTER — Encounter (INDEPENDENT_AMBULATORY_CARE_PROVIDER_SITE_OTHER): Payer: Self-pay | Admitting: *Deleted

## 2008-06-21 ENCOUNTER — Ambulatory Visit: Payer: Self-pay | Admitting: Internal Medicine

## 2008-06-24 ENCOUNTER — Encounter (INDEPENDENT_AMBULATORY_CARE_PROVIDER_SITE_OTHER): Payer: Self-pay | Admitting: *Deleted

## 2008-06-27 ENCOUNTER — Encounter (INDEPENDENT_AMBULATORY_CARE_PROVIDER_SITE_OTHER): Payer: Self-pay | Admitting: *Deleted

## 2008-06-27 LAB — CONVERTED CEMR LAB: Hgb A1c MFr Bld: 6 % (ref 4.6–6.0)

## 2008-06-28 ENCOUNTER — Encounter (INDEPENDENT_AMBULATORY_CARE_PROVIDER_SITE_OTHER): Payer: Self-pay | Admitting: *Deleted

## 2008-06-28 ENCOUNTER — Ambulatory Visit: Payer: Self-pay | Admitting: Internal Medicine

## 2008-07-01 ENCOUNTER — Encounter (INDEPENDENT_AMBULATORY_CARE_PROVIDER_SITE_OTHER): Payer: Self-pay | Admitting: *Deleted

## 2008-08-17 ENCOUNTER — Telehealth (INDEPENDENT_AMBULATORY_CARE_PROVIDER_SITE_OTHER): Payer: Self-pay | Admitting: *Deleted

## 2008-11-16 ENCOUNTER — Telehealth: Payer: Self-pay | Admitting: Internal Medicine

## 2008-12-07 ENCOUNTER — Telehealth (INDEPENDENT_AMBULATORY_CARE_PROVIDER_SITE_OTHER): Payer: Self-pay | Admitting: *Deleted

## 2009-03-08 ENCOUNTER — Ambulatory Visit: Payer: Self-pay | Admitting: Family Medicine

## 2009-03-08 ENCOUNTER — Telehealth (INDEPENDENT_AMBULATORY_CARE_PROVIDER_SITE_OTHER): Payer: Self-pay | Admitting: *Deleted

## 2009-03-08 DIAGNOSIS — M79609 Pain in unspecified limb: Secondary | ICD-10-CM

## 2009-03-23 ENCOUNTER — Ambulatory Visit: Payer: Self-pay | Admitting: Internal Medicine

## 2009-03-23 DIAGNOSIS — R5381 Other malaise: Secondary | ICD-10-CM

## 2009-03-23 DIAGNOSIS — F458 Other somatoform disorders: Secondary | ICD-10-CM

## 2009-03-23 DIAGNOSIS — I499 Cardiac arrhythmia, unspecified: Secondary | ICD-10-CM | POA: Insufficient documentation

## 2009-03-23 DIAGNOSIS — R5383 Other fatigue: Secondary | ICD-10-CM | POA: Insufficient documentation

## 2009-03-23 DIAGNOSIS — I498 Other specified cardiac arrhythmias: Secondary | ICD-10-CM | POA: Insufficient documentation

## 2009-03-24 ENCOUNTER — Ambulatory Visit: Payer: Self-pay | Admitting: Internal Medicine

## 2009-04-04 ENCOUNTER — Encounter (INDEPENDENT_AMBULATORY_CARE_PROVIDER_SITE_OTHER): Payer: Self-pay | Admitting: *Deleted

## 2009-04-04 LAB — CONVERTED CEMR LAB
ALT: 22 units/L (ref 0–35)
AST: 24 units/L (ref 0–37)
Albumin: 4 g/dL (ref 3.5–5.2)
BUN: 23 mg/dL (ref 6–23)
Basophils Relative: 0.1 % (ref 0.0–3.0)
CO2: 30 meq/L (ref 19–32)
Calcium: 8.7 mg/dL (ref 8.4–10.5)
Chloride: 104 meq/L (ref 96–112)
Creatinine, Ser: 1.4 mg/dL — ABNORMAL HIGH (ref 0.4–1.2)
Eosinophils Relative: 6.9 % — ABNORMAL HIGH (ref 0.0–5.0)
Folate: 15.8 ng/mL
Free T4: 0.8 ng/dL (ref 0.6–1.6)
HCT: 42.9 % (ref 36.0–46.0)
HDL: 34.8 mg/dL — ABNORMAL LOW (ref >39.00)
Hemoglobin: 14.6 g/dL (ref 12.0–15.0)
Lymphocytes Relative: 27.4 % (ref 12.0–46.0)
Lymphs Abs: 2 10*3/uL (ref 0.7–4.0)
Monocytes Relative: 10 % (ref 3.0–12.0)
Neutro Abs: 4 10*3/uL (ref 1.4–7.7)
RBC: 4.79 M/uL (ref 3.87–5.11)
TSH: 3.24 microintl units/mL (ref 0.35–5.50)
Total Bilirubin: 1 mg/dL (ref 0.3–1.2)
Total CHOL/HDL Ratio: 5
Vitamin B-12: 1259 pg/mL — ABNORMAL HIGH (ref 211–911)

## 2009-04-05 ENCOUNTER — Ambulatory Visit: Payer: Self-pay

## 2009-04-05 ENCOUNTER — Encounter: Payer: Self-pay | Admitting: Internal Medicine

## 2009-05-08 ENCOUNTER — Telehealth (INDEPENDENT_AMBULATORY_CARE_PROVIDER_SITE_OTHER): Payer: Self-pay | Admitting: *Deleted

## 2009-07-14 ENCOUNTER — Encounter: Payer: Self-pay | Admitting: Internal Medicine

## 2009-08-02 ENCOUNTER — Ambulatory Visit: Payer: Self-pay | Admitting: Internal Medicine

## 2009-08-02 LAB — CONVERTED CEMR LAB
Blood in Urine, dipstick: NEGATIVE
Ketones, urine, test strip: NEGATIVE
Nitrite: NEGATIVE
Protein, U semiquant: NEGATIVE
Specific Gravity, Urine: 1.015
Urobilinogen, UA: 0.2
WBC Urine, dipstick: NEGATIVE

## 2009-08-04 ENCOUNTER — Ambulatory Visit: Payer: Self-pay | Admitting: Internal Medicine

## 2009-08-07 ENCOUNTER — Encounter (INDEPENDENT_AMBULATORY_CARE_PROVIDER_SITE_OTHER): Payer: Self-pay | Admitting: *Deleted

## 2009-08-07 LAB — CONVERTED CEMR LAB
BUN: 27 mg/dL — ABNORMAL HIGH (ref 6–23)
Potassium: 4.5 meq/L (ref 3.5–5.1)
Uric Acid, Serum: 6.7 mg/dL (ref 2.4–7.0)

## 2009-08-11 ENCOUNTER — Telehealth: Payer: Self-pay | Admitting: Internal Medicine

## 2009-08-14 ENCOUNTER — Telehealth (INDEPENDENT_AMBULATORY_CARE_PROVIDER_SITE_OTHER): Payer: Self-pay | Admitting: *Deleted

## 2009-08-18 ENCOUNTER — Encounter: Payer: Self-pay | Admitting: Internal Medicine

## 2009-09-04 ENCOUNTER — Ambulatory Visit (HOSPITAL_COMMUNITY): Admission: RE | Admit: 2009-09-04 | Discharge: 2009-09-04 | Payer: Self-pay | Admitting: Urology

## 2009-09-04 ENCOUNTER — Encounter: Payer: Self-pay | Admitting: Internal Medicine

## 2009-10-09 ENCOUNTER — Encounter: Payer: Self-pay | Admitting: Internal Medicine

## 2009-12-11 ENCOUNTER — Telehealth (INDEPENDENT_AMBULATORY_CARE_PROVIDER_SITE_OTHER): Payer: Self-pay | Admitting: *Deleted

## 2010-02-21 ENCOUNTER — Inpatient Hospital Stay (HOSPITAL_COMMUNITY)
Admission: EM | Admit: 2010-02-21 | Discharge: 2010-02-22 | Payer: Self-pay | Source: Home / Self Care | Admitting: Emergency Medicine

## 2010-02-21 ENCOUNTER — Ambulatory Visit: Payer: Self-pay | Admitting: Cardiology

## 2010-02-21 ENCOUNTER — Ambulatory Visit: Payer: Self-pay | Admitting: Family Medicine

## 2010-02-22 ENCOUNTER — Encounter (INDEPENDENT_AMBULATORY_CARE_PROVIDER_SITE_OTHER): Payer: Self-pay | Admitting: Internal Medicine

## 2010-03-07 ENCOUNTER — Ambulatory Visit: Payer: Self-pay | Admitting: Internal Medicine

## 2010-03-16 ENCOUNTER — Telehealth: Payer: Self-pay | Admitting: Internal Medicine

## 2010-04-09 ENCOUNTER — Encounter: Payer: Self-pay | Admitting: Internal Medicine

## 2010-04-10 ENCOUNTER — Encounter (INDEPENDENT_AMBULATORY_CARE_PROVIDER_SITE_OTHER): Payer: Self-pay | Admitting: *Deleted

## 2010-06-04 ENCOUNTER — Encounter: Payer: Self-pay | Admitting: Internal Medicine

## 2010-07-02 ENCOUNTER — Encounter: Payer: Self-pay | Admitting: Internal Medicine

## 2010-07-11 ENCOUNTER — Telehealth: Payer: Self-pay | Admitting: Internal Medicine

## 2010-07-16 ENCOUNTER — Encounter: Payer: Self-pay | Admitting: Internal Medicine

## 2010-07-18 ENCOUNTER — Encounter: Payer: Self-pay | Admitting: Internal Medicine

## 2010-07-27 ENCOUNTER — Encounter: Payer: Self-pay | Admitting: Internal Medicine

## 2010-07-27 ENCOUNTER — Telehealth (INDEPENDENT_AMBULATORY_CARE_PROVIDER_SITE_OTHER): Payer: Self-pay | Admitting: *Deleted

## 2010-07-27 ENCOUNTER — Ambulatory Visit: Payer: Self-pay | Admitting: Internal Medicine

## 2010-07-27 LAB — CONVERTED CEMR LAB: Cholesterol, target level: 200 mg/dL

## 2010-08-06 LAB — CONVERTED CEMR LAB
AST: 11 units/L (ref 0–37)
Albumin: 3.7 g/dL (ref 3.5–5.2)
Basophils Relative: 0.6 % (ref 0.0–3.0)
Calcium: 9.1 mg/dL (ref 8.4–10.5)
Chloride: 105 meq/L (ref 96–112)
Creatinine, Ser: 1.1 mg/dL (ref 0.4–1.2)
Eosinophils Relative: 7.6 % — ABNORMAL HIGH (ref 0.0–5.0)
Hemoglobin: 14.7 g/dL (ref 12.0–15.0)
Hgb A1c MFr Bld: 6.3 % (ref 4.6–6.5)
LDL Cholesterol: 131 mg/dL — ABNORMAL HIGH (ref 0–99)
Lymphocytes Relative: 23.5 % (ref 12.0–46.0)
MCV: 88.9 fL (ref 78.0–100.0)
Neutrophils Relative %: 60.5 % (ref 43.0–77.0)
RBC: 4.87 M/uL (ref 3.87–5.11)
Sodium: 140 meq/L (ref 135–145)
TSH: 1.96 microintl units/mL (ref 0.35–5.50)
Total Bilirubin: 0.7 mg/dL (ref 0.3–1.2)
Total CHOL/HDL Ratio: 5
Triglycerides: 117 mg/dL (ref 0.0–149.0)
WBC: 7.3 10*3/uL (ref 4.5–10.5)

## 2010-08-08 ENCOUNTER — Telehealth: Payer: Self-pay | Admitting: Internal Medicine

## 2010-08-19 ENCOUNTER — Encounter: Payer: Self-pay | Admitting: Internal Medicine

## 2010-08-19 ENCOUNTER — Encounter: Payer: Self-pay | Admitting: Podiatry

## 2010-08-28 NOTE — Letter (Signed)
Summary: Health Risk Assessment/[facility]  Health Risk Assessment/[employer]   Imported By: Lanelle Bal 12/01/2009 11:49:39  _____________________________________________________________________  External Attachment:    Type:   Image     Comment:   External Document

## 2010-08-28 NOTE — Letter (Signed)
Summary: [facility]  [facility]   Imported By: Lanelle Bal 08/29/2009 08:38:47  _____________________________________________________________________  External Attachment:    Type:   Image     Comment:   External Document
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
## 2010-08-28 NOTE — Assessment & Plan Note (Signed)
Summary: FOR VOMITING AND SWEATING--PH   Vital Signs:  Patient profile:   77 year old female Height:      61.75 inches Weight:      145 pounds BMI:     26.83 O2 Sat:      93 % on Room air Temp:     98.0 degrees F oral Pulse rate:   83 / minute BP sitting:   124 / 72  (left arm)  Vitals Entered By: Doristine Devoid CMA (February 21, 2010 2:14 PM)  O2 Flow:  Room air CC: vomiting and sweating started this morning- concerned about heart   History of Present Illness: 77 yo woman here today for vomiting and diaphoresis.  pt was at [facility] earlier today b/c 'we thought it was my heart' but left b/c 'there were too many people'.  sxs started this AM at 9am.  no fever.  'i'm so weak i can't stand up'.  pt entered office in a wheel chair.  denies SOB, CP- 'but my whole family has heart problems, it must be my heart'.  when pt laid down for EKG complained of R shoulder pain.  Problems Prior to Update: 1)  Anhedonia  (ICD-300.89) 2)  Fatigue  (ICD-780.79) 3)  Unspecified Cardiac Dysrhythmia  (ICD-427.9) 4)  Foot Pain, Left  (ICD-729.5) 5)  Uns Advrs Eff Uns Rx Medicinal&biological Sbstnc  (ICD-995.20) 6)  Depressive Disorder Not Elsewhere Classified  (ICD-311) 7)  Peroneal Neuropathy  (ICD-956.3) 8)  Pain in Joint, Site Unspecified  (ICD-719.40) 9)  Chest Xray, Abnormal  (ICD-793.1) 10)  Polyneuropathy in Diabetes  (ICD-357.2) 11)  Unspecified Essential Hypertension  (ICD-401.9) 12)  Diabetes Mellitus, Type II, Controlled  (ICD-250.00) 13)  Chronic Obstructive Pulmonary Disease  (ICD-496) 14)  Carcinoma, Renal Cell  (ICD-189.0) 15)  Pulmonary Embolism, Hx of  (ICD-V12.51) 16)  Diabetes Mellitus, Type II  (ICD-250.00) 17)  Gerd  (ICD-530.81) 18)  Pain in Joint, Unspecified Site  (ICD-719.40) 19)  Ibs  (ICD-564.1) 20)  Hypertension  (ICD-401.9) 21)  Hyperlipidemia  (ICD-272.4) 22)  Diverticulosis, Colon  (ICD-562.10)  Current Medications (verified): 1)  Lisinopril-Hydrochlorothiazide  20-12.5 Mg Tabs (Lisinopril-Hydrochlorothiazide) .Marland Kitchen.. 1 By Mouth Qd 2)  Verapamil Hcl Cr 180 Mg Tbcr (Verapamil Hcl) .Marland Kitchen.. 1 By Mouth Qd 3)  Serevent Diskus 50 Mcg/dose Aepb (Salmeterol Xinafoate) .... Bid 4)  Ranitidine Hcl 150 Mg  Tabs (Ranitidine Hcl) .Marland Kitchen.. 1 Q 12 Hrs As Needed Reflux 5)  Tramadol Hcl 50 Mg  Tabs (Tramadol Hcl) .... 1/2 To 1 Tablet Q 6 Hours As Needed Pain To Replace Darvocet 6)  Freestyle Test   Strp (Glucose Blood) .... Use Twice Daily As Directed 7)  Flovent Hfa 44 Mcg/act  Aero (Fluticasone Propionate  Hfa) .... Inhale 1 To 2 Puffs Two Times A Day As Needed 8)  Proair Hfa 108 (90 Base) Mcg/act  Aers (Albuterol Sulfate) .... Use As Needed 9)  Cymbalta 60 Mg Cpep (Duloxetine Hcl) .Marland Kitchen.. 1 By Mouth Once Daily 10)  Abilify 5 Mg Tabs (Aripiprazole) .... 1/2 Once Daily 11)  Trimethoprim 100 Mg Tabs (Trimethoprim) .Marland Kitchen.. 1 By Mouth At Bedtime  Allergies (verified): 1)  ! Wellbutrin 2)  ! Codeine  Past History:  Past Medical History: Last updated: 03/23/2009 UNSPECIFIED ESSENTIAL HYPERTENSION (ICD-401.9) DIABETES MELLITUS, TYPE II, CONTROLLED (ICD-250.00) CHRONIC OBSTRUCTIVE PULMONARY DISEASE (ICD-496) CARCINOMA, RENAL CELL (ICD-189.0) PULMONARY EMBOLISM, HX OF (ICD-V12.51) DIABETES MELLITUS, TYPE II (ICD-250.00) GERD (ICD-530.81) PAIN IN JOINT, UNSPECIFIED SITE (ICD-719.40) IBS (ICD-564.1) HYPERTENSION (ICD-401.9) HYPERLIPIDEMIA (ICD-272.4)  DIVERTICULOSIS, COLON (ICD-562.10) Spinal Stenosis  Review of Systems      See HPI  Physical Exam  General:  obviously uncomfortable, carrying vomit bag Lungs:  Normal respiratory effort, chest expands symmetrically. Lungs are clear to auscultation, no crackles or wheezes. Heart:  reg S1/S2 w/out M/R/G Abdomen:  soft, NT/ND, hypoactive bowel sounds   Impression & Recommendations:  Problem # 1:  VOMITING (ICD-787.03) Assessment New  may be cardiac related given pt's EKG changes  Orders: EKG w/ Interpretation  (93000)  Problem # 2:  ABNORMAL ELECTROCARDIOGRAM (ICD-794.31) Assessment: New pt w/ T wave inversions in I, V1, V2- concerned for posterior process.  EMS called and pt to be transported emergently to hospital.  Complete Medication List: 1)  Lisinopril-hydrochlorothiazide 20-12.5 Mg Tabs (Lisinopril-hydrochlorothiazide) .Marland Kitchen.. 1 by mouth qd 2)  Verapamil Hcl Cr 180 Mg Tbcr (Verapamil hcl) .Marland Kitchen.. 1 by mouth qd 3)  Serevent Diskus 50 Mcg/dose Aepb (Salmeterol xinafoate) .... Bid 4)  Ranitidine Hcl 150 Mg Tabs (Ranitidine hcl) .Marland Kitchen.. 1 q 12 hrs as needed reflux 5)  Tramadol Hcl 50 Mg Tabs (Tramadol hcl) .... 1/2 to 1 tablet q 6 hours as needed pain to replace darvocet 6)  Freestyle Test Strp (Glucose blood) .... Use twice daily as directed 7)  Flovent Hfa 44 Mcg/act Aero (Fluticasone propionate  hfa) .... Inhale 1 to 2 puffs two times a day as needed 8)  Proair Hfa 108 (90 Base) Mcg/act Aers (Albuterol sulfate) .... Use as needed 9)  Cymbalta 60 Mg Cpep (Duloxetine hcl) .Marland Kitchen.. 1 by mouth once daily 10)  Abilify 5 Mg Tabs (Aripiprazole) .... 1/2 once daily 11)  Trimethoprim 100 Mg Tabs (Trimethoprim) .Marland Kitchen.. 1 by mouth at bedtime  Appended Document: FOR VOMITING AND SWEATING--PH EMS arrived and pt's HR now irregular and bounding.  pt weak w/ standing and being transported emergently via EMS

## 2010-08-28 NOTE — Progress Notes (Signed)
Summary: CALL A NURSE  Phone Note From Other Clinic   Caller: CALL A NURSE Summary of Call: [facility] Triage Call Report Triage Record Num: [id number] Operator: Dayton Martes Patient Name: Alexa [name] Call Date & Time: 03/15/2010 8:00:06PM Patient Phone: [phone] PCP: Patient Gender: Female PCP Fax : Patient DOB: 1929-12-31 Practice Name: [facility] Reason for Call: Cheri/Daughter sts that pt is very nauseated and "about to vomit." Onset  ~ 1 hr ago. Daughter sts, "She is not going to the hospital. She just needs that sick medicine called in. If you can't do that she will just sit here and die." Call disconnected. Caller called back and call was sent to another RN. Protocol(s) Used: Office Note Recommended Outcome per Protocol: Information Noted and Sent to Office Reason for Outcome: Caller information to office Care Advice:  ~  Follow-up for Phone Call        please update status, especially as to fever, abd pain, continued N&V Follow-up by: Marga Melnick MD,  March 16, 2010 12:55 PM  Additional Follow-up for Phone Call Additional follow up Details #1::        Spoke with patient and she denies still having any of the symptoms above. Patient states that she feels better today and denies needing anything. Additional Follow-up by: Lucious Groves CMA,  March 16, 2010 2:12 PM    Additional Follow-up for Phone Call Additional follow up Details #2::    noted Follow-up by: Marga Melnick MD,  March 16, 2010 2:28 PM

## 2010-08-28 NOTE — Letter (Signed)
Summary: Consuela Mimes MD  Consuela Mimes MD   Imported By: Lanelle Bal 06/12/2010 09:02:42  _____________________________________________________________________  External Attachment:    Type:   Image     Comment:   External Document

## 2010-08-28 NOTE — Letter (Signed)
Summary: Colonoscopy Letter  [facility]  [address] [place], [place] [address]   Phone: [phone]  Fax: [phone]      April 10, 2010 MRN: [id number]   EVALISE ABRUZZESE [address] [place], [place]  [address]   Dear Ms. CHRISTINE,   According to your medical record, it is time for you to schedule a Colonoscopy. The American Cancer Society recommends this procedure as a method to detect early colon cancer. Patients with a family history of colon cancer, or a personal history of colon polyps or inflammatory bowel disease are at increased risk.  This letter has beeen generated based on the recommendations made at the time of your procedure. If you feel that in your particular situation this may no longer apply, please contact our office.  Please call our office at [phone] to schedule this appointment or to update your records at your earliest convenience.  Thank you for cooperating with [place] to provide you with the very best care possible.   Sincerely,   Judie Petit T. Russella Dar, M.D.  [facility] [phone]

## 2010-08-28 NOTE — Letter (Signed)
Summary: [facility]  [facility]   Imported By: Lanelle Bal 09/11/2009 13:23:32  _____________________________________________________________________  External Attachment:    Type:   Image     Comment:   External Document

## 2010-08-28 NOTE — Letter (Signed)
Summary: Results Follow up Letter  [facility] [place]  [address] [place], [place] [address]   Phone: [phone]  Fax: [phone]    08/07/2009 MRN: [id number]  [facility] [address] [place], [place]  [address]  Dear Ms. Maryagnes Amos,  The following are the results of your recent test(s):  Test         Result    Pap Smear:        Normal _____  Not Normal _____ Comments: ______________________________________________________ Cholesterol: LDL(Bad cholesterol):         Your goal is less than:         HDL (Good cholesterol):       Your goal is more than: Comments:  ______________________________________________________ Mammogram:        Normal _____  Not Normal _____ Comments:  ___________________________________________________________________ Hemoccult:        Normal _____  Not normal _______ Comments:    _____________________________________________________________________ Other Tests: PLEASE SEE COPY OF LABS FROM 08/02/09 AND COMMENTS     We routinely do not discuss normal results over the telephone.  If you desire a copy of the results, or you have any questions about this information we can discuss them at your next office visit.   Sincerely,

## 2010-08-28 NOTE — Assessment & Plan Note (Signed)
Summary: bs elevated//fd   Vital Signs:  Patient profile:   77 year old female Weight:      152.4 pounds Temp:     98.2 degrees F oral Pulse rate:   94 / minute Resp:     17 per minute BP sitting:   114 / 72  (left arm) Cuff size:   large  Vitals Entered By: Shonna Chock (August 02, 2009 3:16 PM) CC: 1.) Elevated BS  2.)? Kidney Infection: Burning  3.) Foot Pain (x 5 years) Comments REVIEWED MED LIST, PATIENT AGREED DOSE AND INSTRUCTION CORRECT    CC:  1.) Elevated BS  2.)? Kidney Infection: Burning  3.) Foot Pain (x 5 years).  History of Present Illness: FBS 133-157 in am; 2 hr post meal < 228. No hypoglycemia; weight stable. Dr Nicholas Lose has Rxed Trimethoprim 100 mg chronically for UTI.  Allergies: 1)  ! Wellbutrin 2)  ! Codeine  Review of Systems General:  Complains of sweats; denies chills and fever. GU:  Complains of dysuria; denies discharge and hematuria; dysuria as of today. MS:  Complains of joint redness and low back pain; denies joint swelling; Diffuse L foot pain last night, narcotic pain helped .  Physical Exam  General:  Appears younger than age,in no acute distress; alert,appropriate and cooperative throughout examination Abdomen:  Bowel sounds positive,abdomen soft but slightly tender  supra pubic areawithout masses, organomegaly or hernias noted. Msk:  No flank tenderness; sat up w/o help Extremities:  Neg SLR; OA of toes Neurologic:  alert & oriented X3, strength normal in lower extremities, gait normal, and DTRs symmetrical and normal.   Skin:  Intact without suspicious lesions or rashes Psych:  memory intact for recent and remote, normally interactive, and good eye contact.     Impression & Recommendations:  Problem # 1:  DYSURIA (ICD-788.1)  Her updated medication list for this problem includes:    Trimethoprim 100 Mg Tabs (Trimethoprim) .Marland Kitchen... 1 by mouth at bedtime  Orders: UA Dipstick w/o Micro (manual) (91478) T-Culture, Urine  (29562-13086)  Problem # 2:  DIABETES MELLITUS, TYPE II, CONTROLLED (ICD-250.00)  Her updated medication list for this problem includes:    Lisinopril-hydrochlorothiazide 20-12.5 Mg Tabs (Lisinopril-hydrochlorothiazide) .Marland Kitchen... 1 by mouth qd  Orders: Venipuncture (57846) TLB-Creatinine, Blood (82565-CREA) TLB-Potassium (K+) (84132-K) TLB-BUN (Urea Nitrogen) (84520-BUN) TLB-A1C / Hgb A1C (Glycohemoglobin) (83036-A1C)  Problem # 3:  FOOT PAIN, LEFT (ICD-729.5)  Orders: Venipuncture (96295) TLB-Uric Acid, Blood (84550-URIC)  Complete Medication List: 1)  Lisinopril-hydrochlorothiazide 20-12.5 Mg Tabs (Lisinopril-hydrochlorothiazide) .Marland Kitchen.. 1 by mouth qd 2)  Verapamil Hcl Cr 180 Mg Tbcr (Verapamil hcl) .Marland Kitchen.. 1 by mouth qd 3)  Serevent Diskus 50 Mcg/dose Aepb (Salmeterol xinafoate) .... Bid 4)  Ranitidine Hcl 150 Mg Tabs (Ranitidine hcl) .Marland Kitchen.. 1 q 12 hrs as needed reflux 5)  Tramadol Hcl 50 Mg Tabs (Tramadol hcl) .... 1/2 to 1 tablet q 6 hours as needed pain to replace darvocet 6)  Freestyle Test Strp (Glucose blood) .... Use twice daily as directed 7)  Flovent Hfa 44 Mcg/act Aero (Fluticasone propionate  hfa) .... Inhale 1 to 2 puffs two times a day as needed 8)  Proair Hfa 108 (90 Base) Mcg/act Aers (Albuterol sulfate) .... Use as needed 9)  Cymbalta 60 Mg Cpep (Duloxetine hcl) .Marland Kitchen.. 1 by mouth once daily 10)  Abilify 5 Mg Tabs (Aripiprazole) .... 1/2 once daily 11)  Trimethoprim 100 Mg Tabs (Trimethoprim) .Marland Kitchen.. 1 by mouth at bedtime 12)  Hydrocodone-acetaminophen 5-325 Mg Tabs (Hydrocodone-acetaminophen) .Marland KitchenMarland KitchenMarland Kitchen  1 by mouth every 4-6 as needed  Patient Instructions: 1)  Drink as much fluid as you can tolerate for the next few days.  Laboratory Results   Urine Tests    Routine Urinalysis   Color: yellow Appearance: Clear Glucose: negative   (Normal Range: Negative) Bilirubin: negative   (Normal Range: Negative) Ketone: negative   (Normal Range: Negative) Spec. Gravity: 1.015    (Normal Range: 1.003-1.035) Blood: negative   (Normal Range: Negative) pH: 6.0   (Normal Range: 5.0-8.0) Protein: negative   (Normal Range: Negative) Urobilinogen: 0.2   (Normal Range: 0-1) Nitrite: negative   (Normal Range: Negative) Leukocyte Esterace: negative   (Normal Range: Negative)

## 2010-08-28 NOTE — Progress Notes (Signed)
Summary: Referral request  Phone Note Call from Patient Call back at Home Phone [phone]   Caller: Patient Summary of Call: Referral to Dr.Dahlstedt, please. No futher information was left.    Marland KitchenShonna Chock  August 14, 2009 1:57 PM   Follow-up for Phone Call        Spoke with patient said her insurance requires a referral. Patient said she may have a UTI and would like to see here urologist that she seen in the past for this BUT insurance said we need to be the referring  Follow-up by: Shonna Chock,  August 14, 2009 1:58 PM

## 2010-08-28 NOTE — Progress Notes (Signed)
Summary: med concern  Phone Note Outgoing Call Call back at Home Phone [phone]   Call placed by: [facility] CMA,  August 11, 2009 4:36 PM Summary of Call: refill request for BENZONATATE 100 MG CAPSULE. called pt inform pt that this is not a maintenance med that it is only use for cough.. pt states that she just developed a cough with yellowish mucous, SOB and a fever x4day.  Patient instructed that she needs an appointment but preferred that I have Dr.Kou Gucciardo review this message first to see if he will  refill this med. pt last OV 08-02-09, last filled 05-10-09 #15..............Marland KitchenFelecia Deloach CMA  August 11, 2009 4:35 PM   Follow-up for Phone Call        per dr Kaly Mcquary ok #15 three times a day as needed, pt aware rx sent to pharmacy rx resent to pharmacy fax to pharmacy not working rx sent electronically......................Marland KitchenFelecia Deloach CMA  August 11, 2009 5:03 PM      Prescriptions: BENZONATATE 100 MG CAPS (BENZONATATE) take 1 capsule by mouth every 8 hours if needed for cough  #15 x 0   Entered by:   Jeremy Johann CMA   Authorized by:   Marga Melnick MD   Signed by:   Jeremy Johann CMA on 08/11/2009   Method used:   Electronically to        [id number]* (retail)       [address]       [place], [place]  [address]       Ph: [phone] or [phone]       Fax: [phone]   RxID:   [id number]

## 2010-08-28 NOTE — Progress Notes (Signed)
Summary: pt wants samples of cymbalta  Phone Note Call from Patient Call back at Home Phone [phone]   Caller: Patient Summary of Call: patient wants samples of cymbalta-  Initial call taken by: Okey Regal Spring,  Dec 11, 2009 12:03 PM  Follow-up for Phone Call        Spoke with patient and informed her samples at the front for pick-up.  **Note placed in sample bag informing patient afdditional samples require an appointment** Follow-up by: Shonna Chock,  Dec 12, 2009 8:40 AM

## 2010-08-28 NOTE — Miscellaneous (Signed)
Summary: [facility]  [facility]   Imported By: Lanelle Bal 06/14/2010 13:40:52  _____________________________________________________________________  External Attachment:    Type:   Image     Comment:   External Document

## 2010-08-30 NOTE — Progress Notes (Signed)
Summary: Refill Request (not recieved yesterday)  Phone Note Refill Request Call back at [phone] Message from:  Pharmacy on July 27, 2010 8:14 AM  Refills Requested: Medication #1:  TRAMADOL HCL 50 MG  TABS 1/2 to 1 tablet q 6 hours as needed pain to replace darvocet   Dosage confirmed as above?Dosage Confirmed   Supply Requested: 3 months   Notes: Note: Please resend authorization we did not get it earlier. [facility] [address]   Initial call taken by: Harold Barban,  July 27, 2010 8:16 AM    Prescriptions: TRAMADOL HCL 50 MG  TABS (TRAMADOL HCL) 1/2 to 1 tablet q 6 hours as needed pain to replace darvocet  #90 Tablet x 0   Entered by:   Shonna Chock CMA   Authorized by:   Marga Melnick MD   Signed by:   Shonna Chock CMA on 07/27/2010   Method used:   Electronically to        [id number]* (retail)       [address]       [place], [place]  [address]       Ph: [phone] or [phone]       Fax: [phone]   RxID:   [id number]

## 2010-08-30 NOTE — Progress Notes (Signed)
Summary: Cymbalta samples  Phone Note Call from Patient Call back at [facility] Phone [phone]   Summary of Call: Patient called for Cymbalta samples. Initial call taken by: Lucious Groves CMA,  August 08, 2010 11:54 AM    Prescriptions: CYMBALTA 60 MG CPEP (DULOXETINE HCL) 1 by mouth once daily  #35 x 0   Entered and Authorized by:   Lucious Groves CMA   Signed by:   Lucious Groves CMA on 08/08/2010   Method used:   Samples Given   RxID:   [id number]

## 2010-08-30 NOTE — Letter (Signed)
Summary: [facility]  [facility]   Imported By: Lanelle Bal 08/01/2010 11:49:58  _____________________________________________________________________  External Attachment:    Type:   Image     Comment:   External Document

## 2010-08-30 NOTE — Letter (Signed)
Summary: [facility]  [facility]   Imported By: Lanelle Bal 07/25/2010 10:26:54  _____________________________________________________________________  External Attachment:    Type:   Image     Comment:   External Document

## 2010-08-30 NOTE — Assessment & Plan Note (Signed)
Summary: cpx/fasting/kn   Vital Signs:  Patient profile:   77 year old female Height:      61.75 inches Weight:      147.2 pounds BMI:     27.24 Temp:     97.7 degrees F oral Pulse rate:   72 / minute Resp:     16 per minute BP sitting:   138 / 80  (left arm) Cuff size:   large  Vitals Entered By: Shonna Chock CMA (July 27, 2010 11:24 AM) CC: CPX with fasting labs and refill meds , COPD follow-up, Lipid Management  Vision Screening:Left eye with correction: 20 / 50 Right eye with correction: 20 / 50 Both eyes with correction: 20 / 70       Vision Comments: Patient stated she is overdue for an eye exam to update her glasses. Patient will schedule  Vision Entered By: Shonna Chock CMA (July 27, 2010 11:25 AM)   CC:  CPX with fasting labs and refill meds , COPD follow-up, and Lipid Management.  History of Present Illness: Here for Medicare AWV: 1.Risk factors based on Past M, S, F history:see Diagnoses; chart updated 2.Physical Activities: no exercise 3.Depression/mood:on meds with ? benefit  4.Hearing: whisper heard @ 6 ft 5.ADL's: no limitations 6.Fall Risk: some imbalance ; options discussed (Tai Chi, water aerobics) 7.Home Safety:  throw rugs discussed 8.Height, weight, & visual acuity:see VS 9.Counseling: POA & Living Will not in place, discussed 10.Labs ordered based on risk factors: see Orders 11. Referral Coordination: established  12.Care Plan: see Instructions 13.Cognitive Assessment: Oriented X 3; memory & recall  intact   ; "WORLD " spelled backwards; affect & mood subdued, flat. Hypertension Follow-Up:  The patient reports urinary frequency( she saw Urologist in early Dec) and persistent fatigue, but denies lightheadedness, headaches, and edema.  Associated symptoms include dyspnea  on exertion and occasional  palpitations.  The patient denies the following associated symptoms: chest pain, chest pressure, and syncope.  Compliance with medications (by  patient report) has been near 100%.  The patient reports that dietary compliance has been good.  Adjunctive measures currently used by the patient include salt restriction.  BP well controlled @ home. COPD Follow-Up      The patient also presents for COPD follow-up.  The patient reports cough, increased sputum, and exercise induced symptoms, but denies nocturnal awakening.  Symptoms occur only with exertion.  The patient reports limitation of moderate activities.  Medication use includes quick relief med rarely  ( ProAir) and controller meds (Flovent & Serevent) two times a day with control.    Lipid Management History:      Positive NCEP/ATP III risk factors include female age 76 years old or older, diabetes, HDL cholesterol less than 40, and hypertension.  Negative NCEP/ATP III risk factors include no family history for ischemic heart disease, non-tobacco-user status, no ASHD (atherosclerotic heart disease), no prior stroke/TIA, no peripheral vascular disease, and no history of aortic aneurysm.     Preventive Screening-Counseling & Management  Alcohol-Tobacco     Alcohol drinks/day: 0     Smoking Status: never  Caffeine-Diet-Exercise     Caffeine use/day: occasional     Does Patient Exercise: no  Hep-HIV-STD-Contraception     Dental Visit-last 6 months no     Dental Care Counseling: due to finances     Sun Exposure-Excessive: no  Safety-Violence-Falls     Seat Belt Use: yes      Blood Transfusions:  no.  Travel History:  [place] 2003.    Current Medications (verified): 1)  Lisinopril-Hydrochlorothiazide 20-12.5 Mg Tabs (Lisinopril-Hydrochlorothiazide) .Marland Kitchen.. 1 By Mouth Qd 2)  Verapamil Hcl Cr 180 Mg Tbcr (Verapamil Hcl) .Marland Kitchen.. 1 By Mouth Qd 3)  Serevent Diskus 50 Mcg/dose Aepb (Salmeterol Xinafoate) .... Bid 4)  Ranitidine Hcl 150 Mg  Tabs (Ranitidine Hcl) .Marland Kitchen.. 1 Q 12 Hrs As Needed Reflux 5)  Tramadol Hcl 50 Mg  Tabs (Tramadol Hcl) .... 1/2 To 1 Tablet Q 6 Hours As Needed  Pain To Replace Darvocet 6)  Freestyle Test   Strp (Glucose Blood) .... Use Twice Daily As Directed 7)  Flovent Hfa 44 Mcg/act  Aero (Fluticasone Propionate  Hfa) .... Inhale 1 To 2 Puffs Two Times A Day As Needed 8)  Proair Hfa 108 (90 Base) Mcg/act  Aers (Albuterol Sulfate) .... Use As Needed 9)  Cymbalta 60 Mg Cpep (Duloxetine Hcl) .Marland Kitchen.. 1 By Mouth Once Daily  Allergies: 1)  ! Wellbutrin 2)  ! Codeine  Past History:  Past Medical History: DIABETES MELLITUS, TYPE II, CONTROLLED (ICD-250.00) CHRONIC OBSTRUCTIVE PULMONARY DISEASE / Asthmatic Bronchitis CARCINOMA, RENAL CELL (ICD-189.0) PULMONARY EMBOLISM, HX OF (ICD-V12.51) GERD (ICD-530.81) PAIN IN JOINT, UNSPECIFIED SITE (ICD-719.40) IBS (ICD-564.1) HYPERTENSION (ICD-401.9) HYPERLIPIDEMIA (ICD-272.4) DIVERTICULOSIS, COLON (ICD-562.10) Spinal Stenosis  Past Surgical History: Septoplasty Tonsillectomy gravid 2 abor 2 inner ear times 2 chest pain with esssentially neg cath: single non critical vessel breast biopy times one colonoscopy 2 polyps 2000 colonoscopy 2 polyps  07/2004:hyperplastic polyps D and C, uterine polyps 03/2005 nephro ureterctomy 05/2006 for cancer  PTE, PNA(infarction LLL) post op 05/2006   Family History: mother :cva, uterine cancer, diabetes ,Thoracic  AA brother: died  @ age 32 pacer, CVA, CHF maternal family history MI ; MGF : CVA father: lung cancer sister: CHF,Alsheimer's; sister:  DM(resolved with diet), Alsheimer's, CHF;sister: COPD  Social History: Never Smoked Alcohol use-no Married Retired Caffeine use/day:  occasional Does Patient Exercise:  no Dental Care w/in 6 mos.:  no Sun Exposure-Excessive:  no Risk analyst Use:  yes Blood Transfusions:  no  Review of Systems  The patient denies anorexia, fever, weight loss, weight gain, hoarseness, abdominal pain, melena, hematochezia, severe indigestion/heartburn, suspicious skin lesions, unusual weight change, abnormal bleeding, enlarged  lymph nodes, and angioedema.    Physical Exam  General:  well-nourished,in no acute distress;  flat affect Head:  Normocephalic and atraumatic without obvious abnormalities.  Hair coarse Eyes:  No corneal or conjunctival inflammation noted.Perrla. Funduscopic exam benign, without hemorrhages, exudates or papilledema.  Ears:  External ear exam shows no significant lesions or deformities.  Otoscopic examination reveals clear canals, tympanic membranes are intact bilaterally without bulging, retraction, inflammation or discharge. Hearing is grossly normal bilaterally. Nose:  External nasal examination shows no deformity or inflammation. Nasal mucosa are pink and moist without lesions or exudates. Mouth:  Oral mucosa and oropharynx without lesions or exudates.  Teeth in good repair; upper partial. Neck:  No deformities, masses, or tenderness noted. Thyroid small Lungs:  Normal respiratory effort, chest expands symmetrically. Lungs are clear to auscultation, no crackles or wheezes. Heart:  normal rate, regular rhythm, no gallop, no rub, no JVD, no HJR, and grade 1 /6 systolic murmur.   Abdomen:  Bowel sounds positive,abdomen soft and non-tender without masses, organomegaly or hernias noted. Genitalia:  Dr Nicholas Lose Msk:  No deformity or scoliosis noted of thoracic or lumbar spine.   Pulses:  R and L carotid,radial,dorsalis pedis and posterior tibial pulses are full and equal  bilaterally Extremities:  No clubbing, cyanosis, edema. Severe mixed arthritic hand  deformities noted . No significant knee changes Neurologic:  alert & oriented X3 and DTRs symmetrical and normal.   Skin:  Epidermoid inclusion cyst upper back Cervical Nodes:  No lymphadenopathy noted Axillary Nodes:  No palpable lymphadenopathy Psych:  memory intact for recent and remote, normally interactive, flat affect, and subdued.     Impression & Recommendations:  Problem # 1:  PREVENTIVE HEALTH CARE (ICD-V70.0)  Orders: Medicare  -1st Annual Wellness Visit [phone])  Problem # 2:  CHRONIC OBSTRUCTIVE PULMONARY DISEASE (ICD-496)  Her updated medication list for this problem includes:    Serevent Diskus 50 Mcg/dose Aepb (Salmeterol xinafoate) ..... Bid    Flovent Hfa 44 Mcg/act Aero (Fluticasone propionate  hfa) ..... Inhale 1 to 2 puffs two times a day as needed    Proair Hfa 108 (90 Base) Mcg/act Aers (Albuterol sulfate) ..... Use as needed  Problem # 3:  FATIGUE, CHRONIC (ICD-780.79)  Orders: Venipuncture (60454) TLB-CBC Platelet - w/Differential (85025-CBCD)  Problem # 4:  HYPERTENSION (ICD-401.9)  Her updated medication list for this problem includes:    Lisinopril-hydrochlorothiazide 20-12.5 Mg Tabs (Lisinopril-hydrochlorothiazide) .Marland Kitchen... 1 by mouth qd    Verapamil Hcl Cr 180 Mg Tbcr (Verapamil hcl) .Marland Kitchen... 1 by mouth qd  Complete Medication List: 1)  Lisinopril-hydrochlorothiazide 20-12.5 Mg Tabs (Lisinopril-hydrochlorothiazide) .Marland Kitchen.. 1 by mouth qd 2)  Verapamil Hcl Cr 180 Mg Tbcr (Verapamil hcl) .Marland Kitchen.. 1 by mouth qd 3)  Serevent Diskus 50 Mcg/dose Aepb (Salmeterol xinafoate) .... Bid 4)  Ranitidine Hcl 150 Mg Tabs (Ranitidine hcl) .Marland Kitchen.. 1 q 12 hrs as needed reflux 5)  Tramadol Hcl 50 Mg Tabs (Tramadol hcl) .... 1/2 to 1 tablet q 6 hours as needed pain to replace darvocet 6)  Freestyle Test Strp (Glucose blood) .... Use twice daily as directed 7)  Flovent Hfa 44 Mcg/act Aero (Fluticasone propionate  hfa) .... Inhale 1 to 2 puffs two times a day as needed 8)  Proair Hfa 108 (90 Base) Mcg/act Aers (Albuterol sulfate) .... Use as needed 9)  Cymbalta 60 Mg Cpep (Duloxetine hcl) .Marland Kitchen.. 1 by mouth once daily  Other Orders: EKG w/ Interpretation (93000) TLB-Lipid Panel (80061-LIPID) TLB-BMP (Basic Metabolic Panel-BMET) (80048-METABOL) TLB-Hepatic/Liver Function Pnl (80076-HEPATIC) TLB-TSH (Thyroid Stimulating Hormone) (84443-TSH) Pneumococcal Vaccine (09811) Admin 1st Vaccine (91478)  Lipid Assessment/Plan:       Based on NCEP/ATP III, the patient's risk factor category is "history of diabetes".  The patient's lipid goals are as follows: Total cholesterol goal is 200; LDL cholesterol goal is 100; HDL cholesterol goal is 40; Triglyceride goal is 150.    Patient Instructions: 1)  Consider  completing POA & Living Will as discussed. Continue preventive  Gyn care through Dr Johnn Hai office. Consider Tai Chi or [facility] aerob[facility] Physical Therapy referral if balance issues persist. 2)  Check your Blood Pressure regularly. If it is above:135/85 ON AVERAGE  you should make an appointment. Meds will be sent to [facility] [address] after review of of labs.   Orders Added: 1)  Est. Patient Level III [29562] 2)  Medicare -1st Annual Wellness Visit [G0438] 3)  EKG w/ Interpretation [93000] 4)  Venipuncture [36415] 5)  TLB-Lipid Panel [80061-LIPID] 6)  TLB-BMP (Basic Metabolic Panel-BMET) [80048-METABOL] 7)  TLB-CBC Platelet - w/Differential [85025-CBCD] 8)  TLB-Hepatic/Liver Function Pnl [80076-HEPATIC] 9)  TLB-TSH (Thyroid Stimulating Hormone) [84443-TSH] 10)  Pneumococcal Vaccine [90732] 11)  Admin 1st Vaccine [13086]  Immunizations Administered:  Pneumonia Vaccine:    Vaccine Type: Pneumovax (Medicare)    Site: right deltoid    Mfr: Merck    Dose: 0.5 ml    Route: IM    Given by: Shonna Chock CMA    Exp. Date: 11/28/2011    Lot #: [id number]   Immunizations Administered:  Pneumonia Vaccine:    Vaccine Type: Pneumovax (Medicare)    Site: right deltoid    Mfr: Merck    Dose: 0.5 ml    Route: IM    Given by: Shonna Chock CMA    Exp. Date: 11/28/2011    Lot #: [id number]

## 2010-08-30 NOTE — Letter (Signed)
Summary: Beather Arbour MD  Beather Arbour MD   Imported By: Lanelle Bal 08/03/2010 11:01:29  _____________________________________________________________________  External Attachment:    Type:   Image     Comment:   External Document

## 2010-08-30 NOTE — Progress Notes (Signed)
Summary: Refill, Not on med list  Phone Note Refill Request Message from:  [employer] on July 11, 2010 8:42 AM  Refills Requested: Medication #1:  ONDANSETRON HCL 8 MG TAB TAKE 1/2 TO 1 TABLET EVERY 8 HOURS AS NEEDED [facility]  [address] [place] [id number] --- prescriber on fax is spencer copland   Initial call taken by: Okey Regal Spring,  July 11, 2010 9:05 AM  Follow-up for Phone Call        I spoke with patient and she indicated that she is sick on her stomach, unable to eat, food just makes her sick. This was originally rx'ed by an on call doctor. Patient indicated if this continues she will call for an appointment ( refused appointment today). Patient states she has a pending appointment with Dr.Hopper for the 30th for a CPX.  Dr.Hopper please advise, med not on current med list Follow-up by: Shonna Chock CMA,  July 11, 2010 10:09 AM  Additional Follow-up for Phone Call Additional follow up Details #1::        OK X 1 Additional Follow-up by: Marga Melnick MD,  July 11, 2010 1:56 PM    Additional Follow-up for Phone Call Additional follow up Details #2::    Quantity? Lucious Groves CMA  July 11, 2010 4:10 PM   New/Updated Medications: ONDANSETRON HCL 8 MG TABS (ONDANSETRON HCL) TAKE 1/2 TO 1 TABLET EVERY 8 HOURS AS NEEDED Prescriptions: ONDANSETRON HCL 8 MG TABS (ONDANSETRON HCL) TAKE 1/2 TO 1 TABLET EVERY 8 HOURS AS NEEDED  #5 x 0   Entered by:   Shonna Chock CMA   Authorized by:   Marga Melnick MD   Signed by:   Shonna Chock CMA on 07/12/2010   Method used:   Electronically to        [id number] (retail)       [address]       [place], [place]  [address]       Ph: [phone] or [phone]       Fax: [phone]   RxID:   [id number]

## 2010-09-17 ENCOUNTER — Ambulatory Visit (HOSPITAL_COMMUNITY)
Admission: RE | Admit: 2010-09-17 | Discharge: 2010-09-17 | Disposition: A | Discharge status: Home / Self Care | Payer: Medicare Other | Source: Ambulatory Visit | Attending: Urology | Admitting: Urology

## 2010-09-17 ENCOUNTER — Other Ambulatory Visit: Payer: Self-pay | Admitting: Urology

## 2010-09-17 ENCOUNTER — Encounter: Payer: Self-pay | Admitting: Internal Medicine

## 2010-09-17 DIAGNOSIS — C649 Malignant neoplasm of unspecified kidney, except renal pelvis: Secondary | ICD-10-CM | POA: Insufficient documentation

## 2010-09-17 DIAGNOSIS — I771 Stricture of artery: Secondary | ICD-10-CM | POA: Insufficient documentation

## 2010-09-25 NOTE — Letter (Signed)
Summary: [facility]  [facility]   Imported By: Maryln Gottron 09/21/2010 10:32:05  _____________________________________________________________________  External Attachment:    Type:   Image     Comment:   External Document

## 2010-10-13 LAB — COMPREHENSIVE METABOLIC PANEL
AST: 23 U/L (ref 0–37)
Albumin: 3.9 g/dL (ref 3.5–5.2)
BUN: 29 mg/dL — ABNORMAL HIGH (ref 6–23)
Calcium: 9.1 mg/dL (ref 8.4–10.5)
Chloride: 107 mEq/L (ref 96–112)
Creatinine, Ser: 1.24 mg/dL — ABNORMAL HIGH (ref 0.4–1.2)
GFR calc Af Amer: 51 mL/min — ABNORMAL LOW (ref >60)
Total Bilirubin: 1 mg/dL (ref 0.3–1.2)
Total Protein: 7.3 g/dL (ref 6.0–8.3)

## 2010-10-13 LAB — URINE MICROSCOPIC-ADD ON

## 2010-10-13 LAB — URINALYSIS, ROUTINE W REFLEX MICROSCOPIC
Bilirubin Urine: NEGATIVE
Glucose, UA: NEGATIVE mg/dL
Hgb urine dipstick: NEGATIVE
Hgb urine dipstick: NEGATIVE
Ketones, ur: NEGATIVE mg/dL
Specific Gravity, Urine: 1.022 (ref 1.005–1.030)
Specific Gravity, Urine: 1.024 (ref 1.005–1.030)
Urobilinogen, UA: 0.2 mg/dL (ref 0.0–1.0)

## 2010-10-13 LAB — CK TOTAL AND CKMB (NOT AT ARMC)
CK, MB: 2.6 ng/mL (ref 0.3–4.0)
Relative Index: 2.3 (ref 0.0–2.5)
Total CK: 114 U/L (ref 7–177)
Total CK: 138 U/L (ref 7–177)

## 2010-10-13 LAB — CBC
MCV: 89.9 fL (ref 78.0–100.0)
Platelets: 336 10*3/uL (ref 150–400)
RBC: 5.09 MIL/uL (ref 3.87–5.11)
RDW: 13.9 % (ref 11.5–15.5)
WBC: 11.7 10*3/uL — ABNORMAL HIGH (ref 4.0–10.5)

## 2010-10-13 LAB — CARDIAC PANEL(CRET KIN+CKTOT+MB+TROPI)
CK, MB: 3.8 ng/mL (ref 0.3–4.0)
CK, MB: 4.1 ng/mL — ABNORMAL HIGH (ref 0.3–4.0)
Relative Index: 1.8 (ref 0.0–2.5)
Relative Index: 2.1 (ref 0.0–2.5)
Relative Index: 2.1 (ref 0.0–2.5)
Total CK: 184 U/L — ABNORMAL HIGH (ref 7–177)
Total CK: 200 U/L — ABNORMAL HIGH (ref 7–177)
Troponin I: 0.01 ng/mL (ref 0.00–0.06)

## 2010-10-13 LAB — GLUCOSE, CAPILLARY
Glucose-Capillary: 101 mg/dL — ABNORMAL HIGH (ref 70–99)
Glucose-Capillary: 124 mg/dL — ABNORMAL HIGH (ref 70–99)
Glucose-Capillary: 81 mg/dL (ref 70–99)

## 2010-10-13 LAB — DIFFERENTIAL
Basophils Absolute: 0 10*3/uL (ref 0.0–0.1)
Eosinophils Relative: 2 % (ref 0–5)
Lymphocytes Relative: 11 % — ABNORMAL LOW (ref 12–46)
Lymphs Abs: 1.2 10*3/uL (ref 0.7–4.0)
Monocytes Absolute: 0.4 10*3/uL (ref 0.1–1.0)
Neutro Abs: 9.8 10*3/uL — ABNORMAL HIGH (ref 1.7–7.7)

## 2010-10-13 LAB — URINE CULTURE: Culture  Setup Time: 201107280048

## 2010-10-13 LAB — PROTIME-INR: Prothrombin Time: 13 seconds (ref 11.6–15.2)

## 2010-10-13 LAB — APTT: aPTT: 28 seconds (ref 24–37)

## 2010-10-13 LAB — LIPASE, BLOOD: Lipase: 51 U/L (ref 11–59)

## 2010-12-05 ENCOUNTER — Other Ambulatory Visit: Payer: Self-pay | Admitting: Internal Medicine

## 2010-12-11 NOTE — H&P (Signed)
Alexa [name], Alexa [name]                 ACCOUNT NO.:  [id number]   MEDICAL RECORD NO.:  [id number]          PATIENT TYPE:  EMS   LOCATION:  ED                           FACILITY:  [facility]   PHYSICIAN:  Hollice Espy, M.D.DATE OF BIRTH:  Jan 11, 1930   DATE OF ADMISSION:  04/02/2007  DATE OF DISCHARGE:                              HISTORY & PHYSICAL   PRIORITY HISTORY AND PHYSICAL   PRIMARY CARE PHYSICIAN:  Titus Dubin. Alwyn Ren, MD.   CHIEF COMPLAINT:  Chest pain and shortness of breath.   HISTORY OF PRESENT ILLNESS:  The patient is a 77 year old, white female  with a past medical history of segmental ischemic colitis, hypertension,  diabetes mellitus, chronic peripheral neuropathy, and asthma, who had  been in relatively good health other than worsening arthritis until  today.  First, she had some initial what she thought was reflux but then  worsened into chest pain radiating to her back and left side and breast.  She described it as both an aching and sharp, and she also felt quite  short of breath.  She denied any cough.  She became quite concerned and  came into the emergency room for further evaluation.  In the emergency  room, the patient was initially noted to have a blood pressure of 159/88  with a heart rate of 96, although she was noted to have a saturation of  96% on room air.  A chest x-ray was done, which showed no evidence of  any acute infiltrates or otherwise.  Labs were done, and she was found  to have a slightly elevated BUN of 27 and a creatinine of 1.2.  The rest  of her labs were noted for a normal white count, no shift, normal  cardiac markers, an EKG showing normal sinus rhythm, and a D-dimer at  0.3.  The ER attending was concerned about the possibility of a  dissection and ordered a CT of the chest.  However, this was cancelled  because of her elevated creatinine and replaced with a V-Q due to  confusion by whether or not the patient needed to rule out a  pulmonary  embolus.  She was also started on a nitroglycerin drip for her chest  pain.  Currently, she says her breathing and chest pain are completely  back to normal and she is feeling better.  She denies any headaches,  vision changes, dysphagia, chest pain, palpitations, shortness of  breath, wheeze, cough, abdominal pain, hematuria, dysuria, constipation,  diarrhea, focal extremity numbness, weakness, or pain other than  described above as well as complaints of worsening pain in her hands and  knees secondary to what she thinks to be worsening arthritis.   PAST MEDICAL HISTORY:  1. Segmental ischemic colitis.  2. Hypertension.  3. Anemia.  4. Urinary incontinence.  5. Chronic peripheral neuropathy.  6. Diabetes.  7. Anxiety.  8. Asthma.  9. History of left lower lobe PE in November of 2007.  10.Renal cell carcinoma status post left nephrectomy.   MEDICATIONS:  1. Detrol 4.  2. Lisinopril/HCTZ 20/12.5  3. Omeprazole 20.  4. Verapamil 180.  5. Xanax 0.5 p.r.n.  6. Serevent inhaler p.r.n.  7. Metformin 500 p.o. daily.   She has ALLERGIES TO:  1. WELLBUTRIN.  2. CODEINE.   SOCIAL HISTORY:  She denies any tobacco, alcohol, or drug use, although  she says she was exposed to a lot of secondhand smoke when she was  younger.   FAMILY HISTORY:  Noncontributory.   PHYSICAL EXAMINATION:  VITAL SIGNS ON ADMISSION:  Temp 97, heart rate  96, blood pressure 159/88, respirations 20, O2 SAT 97% on 2 liters.  GENERAL:  She is alert and oriented x3 in no apparent distress.  HEENT:  Normocephalic and atraumatic.  Her mucous membranes are moist.  NECK:  She has no carotid bruits.  HEART:  Soft, but a regular rate and rhythm, S1 and S2.  LUNGS:  Clear to auscultation bilaterally.  She is moving air well.  ABDOMEN:  Soft, nontender, and nondistended, positive bowel sounds.  EXTREMITIES:  No clubbing, cyanosis, or edema.  She is noted on her  hands, however, to have signs in the PIP and  DIP joints, more so on the  second through fourth fingers on the right hand, show signs of  enlargement and deformation.   LABORATORY DATA:  X-rays and EKG are as per HPI.  Sodium 138, potassium  3.7, chloride 106, bicarb 22, BUN 27, creatinine 1.2, glucose 97.  LFTs  are unremarkable.  White count 9.2, H&H 13.8 and 40, MCV of 86, platelet  count 395.  UA unremarkable.  D-dimer 0.3, CPK 173, MB 2.7, troponin-I  less than 0.05.   ASSESSMENT AND PLAN:  1. Chest pain with associated shortness of breath:  Given the      resolution of her symptoms and her normal D-dimer, I am less likely      to think pulmonary embolus but will certainly need to rule out      cardiac given her history of diabetes and hypertension.      Electrocardiogram was normal.  I will check two more sets of      cardiac enzymes.  In addition, we will need to complete scan to      rule out dissection to be thorough, and we will try to reorder a      computerized tomography scan.  In the meantime, we will give gentle      oxygen.  2. Diabetes mellitus:  Continue Metformin plus sliding scale.  3. Acute renal failure, mild, secondary to mild dehydration possibly      from medication:  We will hold her      Lisinopril/HCTZ, gently hydrate, and continue to follow.  4. Arthritis with possible rheumatologic changes:  I am giving the      patient names for rheumatologists so that when she follows with her      primary care physician she may get a referral.      Hollice Espy, M.D.  Electronically Signed     SKK/MEDQ  D:  04/02/2007  T:  04/02/2007  Job:  [id number]   cc:   Titus Dubin. Alwyn Ren, MD,FACP,FCCP  [phone] [place]  [place] [address]

## 2010-12-11 NOTE — Discharge Summary (Signed)
[name], Alexa                 ACCOUNT NO.:  [id number]   MEDICAL RECORD NO.:  [id number]          PATIENT TYPE:  OBV   LOCATION:  1445                         FACILITY:  [facility]   PHYSICIAN:  Barbette Hair. Artist Pais, DO      DATE OF BIRTH:  [birth year]   DATE OF ADMISSION:  04/02/2007  DATE OF DISCHARGE:  04/04/2007                               DISCHARGE SUMMARY   DISCHARGE DIAGNOSES:  1. Atypical chest pain.  2. Gastroesophageal reflux disease.  3. Chronic osteoarthritis with nonsteroidal anti-inflammatory drugs      use.  4. Type 2 diabetes.  5. Chronic chronic obstructive pulmonary disease.  6. History of pulmonary embolism.  7. History of renal cell carcinoma, status post left nephrectomy.  8. History of segmental ischemic colitis.   DISCHARGE MEDICATIONS:  1. Verapamil SR 180 mg once daily.  2. Omeprazole 20 mg twice daily before meals.  3. Metformin 500 mg once daily.  4. Alprazolam 0.5 mg 1/2 tablet twice daily as needed.  5. Detrol LA 4 mg once daily.  6. Lisinopril/HCTZ 25/12.51 daily.  7. The patient is to resume previous inhalers.  8. The patient is to stop Meloxicam suspension and advised not to use      any NSAIDs including ibuprofen, Advil, Aleve, etc.  9. Tylenol arthritis as needed for pain every follow 12 hours.   The patient was instructed to follow-up with Dr. Alwyn Ren in the office.  She is to call for an appointment within 1-2 weeks.   HOSPITAL COURSE:  The patient is a 77 year old white female admitted for  atypical chest pain.  On the day of admission, she experienced reflux-  type symptoms but became concerned when the pain radiated to her back  and upper neck.  She was noted to have started Meloxicam exam suspension  x10 days prior to presentation.  Serial cardiac enzymes were obtained  which were all negative.  A D-dimer was also within normal limits,  excluding possibility of pulmonary embolism.  Due to radiation of her  symptoms toward her back, admitting  physician ordered a CT of the chest.  However, with her history of leftm nephrectomy and mild renal  insufficiency, CT of the chest was performed without contrast.  Although  limited study, radiologist felt that there was no evidence of aortic  dissection.   The patient was placed on Protonix 40 mg b.i.d. and on day of discharge  the patient was chest pain free.  She still had musculoskeletal pain in  her left scapular area and left side of her neck.   We will defer any further cardiac testing to Dr. Alwyn Ren.   CONDITION ON DISCHARGE:  Improved.  The patient was felt stable for  discharge.      Barbette Hair. Artist Pais, DO  Electronically Signed     RDY/MEDQ  D:  04/04/2007  T:  04/04/2007  Job:  [id number]

## 2010-12-11 NOTE — Assessment & Plan Note (Signed)
[facility]                                 ON-CALL NOTE   Alexa [name], Alexa [name]                          MRN:          [id number]  DATE:07/31/2007                            DOB:          [birth year]    PHONE NUMBER:  [phone]   PRIMARY CARE PHYSICIAN:  Dr. Alwyn Ren.   SUBJECTIVE:  Alexa [name] had a post prandial blood sugar of 188.  She is  unfamiliar with what the ranges of blood sugar should be and wanted to  review this to make sure that she was not doing herself harm with this  elevated blood sugar.   ASSESSMENT/PLAN:  Discussed diabetes and normal ranges of blood sugars,  fasting and post prandial.  She was reassured that 188 was somewhat  elevated but not dangerously high.     Kerby Nora, MD  Electronically Signed    AB/MedQ  DD: 07/31/2007  DT: 07/31/2007  Job #: [id number]

## 2010-12-14 NOTE — Discharge Summary (Signed)
[facility]  Patient:    Alexa [name], Alexa [name] Visit Number: [id number] MRN: [id number]          Service Type: EMS Location: [facility] Attending Physician:  Hanley Seamen Dictated by:   Justine Null, M.D. [facility] Admit Date:  01/15/2002 Discharge Date: 01/15/2002                             Discharge Summary  REASON FOR ADMISSION:  Chest pain.  HISTORY OF PRESENT ILLNESS:  The patient is a 77 year old woman admitted by me on August 24, 2001 with chest pain.  Please refer to my dictated history and physical for details.  HOSPITAL COURSE:  The patient was admitted, and CPKs were negative.  Chest CT scan was negative.  She was seen in consultation by cardiology, and a heart catheterization showed several non-flow-limiting stenoses.  She did not have chest pain during her hospitalization.  She was thus discharged to home in good condition.  IMPRESSION: 1. Chest pain of uncertain etiology. 2. Noncritical coronary stenoses on cardiac catheterization. 3. Other chronic medical problems as noted in the history and physical.  DISCHARGE MEDICATIONS: 1. Serevent two puffs b.i.d. 2. Albuterol 1-2 puffs q.4h. as needed for breathing. 3. Lopressor 25 mg b.i.d. 4. Hydrochlorothiazide 12.5 mg daily.  DISCHARGE ACTIVITY:  No limitation.  DISCHARGE DIET:  Low cholesterol.  FOLLOWUP APPOINTMENT:  As needed. Dictated by:   Justine Null, M.D. [facility] Attending Physician:  Hanley Seamen DD:  02/15/02 TD:  02/21/02 Job: [id number]

## 2010-12-14 NOTE — Assessment & Plan Note (Signed)
[facility]                                   ON-CALL NOTE   KEMYA, SHED                          MRN:          [id number]  DATE:04/27/2006                            DOB:          1929-12-30    TIME:  10:27 a.m.   PHONE NUMBER:  [phone]   REGULAR DOCTOR:  Dr. Alwyn Ren.  Dr. Milinda Antis on-call.   CHIEF COMPLAINT:  Went to the hospital last night with bleeding, wanted to  update me.  She states that she had bleeding from the rectum and abdominal  pain.  She was diagnosed with probable diverticulitis but was not put on any  antibiotics.  She was told she needed a referral to Dr. Randa Evens on Monday  morning.  She wanted to let me know that she will need a referral on Monday  morning.  I told her she would need to call Dr. Frederik Pear office when it  opened at 8:30 a.m. to work on getting that referral, and I would dictate  this and send it to Dr. Alwyn Ren so he would get an update.  She said she did,  in fact, feel much better and the bleeding had stopped as of right now.  I  told her to call back or go back to the emergency room if her pain or  bleeding return and she agreed to do this.  Otherwise, she will call Dr.  Frederik Pear office first thing Monday for a referral to Dr. Randa Evens for a  possible colonoscopy.            ______________________________  Audrie Gallus. Tower, MD      MAT/MedQ  DD:  04/27/2006  DT:  04/28/2006  Job #:  [id number]   cc:   Titus Dubin. Alwyn Ren, MD,FACP,FCCP

## 2010-12-14 NOTE — Discharge Summary (Signed)
Alexa [name], Alexa [name]                 ACCOUNT NO.:  [id number]   MEDICAL RECORD NO.:  [id number]          PATIENT TYPE:  INP   LOCATION:  1304                         FACILITY:  [facility]   PHYSICIAN:  Valerie A. Felicity Coyer, MDDATE OF BIRTH:  [birth year]   DATE OF ADMISSION:  11/09/2006  DATE OF DISCHARGE:  11/11/2006                               DISCHARGE SUMMARY   DISCHARGE DIAGNOSES:  1. Segmental ischemic colitis with rectal bleeding, resolved status      post GI evaluation and stable for discharge home.  2. Hypertension, presumably from over treatment.  Hold      antihypertensives until further outpatient follow-up.  Discharge      blood pressure 123/76  3. Mild anemia, question acute blood loss.  Discharge hemoglobin 11.3      and stable.  4. History of urinary urgency with incontinence.  Continue home      Detrol.  5. Chronic peripheral neuropathy.  Continue Neurontin as prior to      admission.  6. Type 2 diabetes.  Continue metformin as prior to admission.  7. Anxiety.  Continue p.r.n. Xanax.  8. History of asthma/COPD.  Continue home inhalers.  9. History of left lower lobe PE 05/2006, following surgery for renal      cell cancer, now off Coumadin times one week.  Outpatient follow-up      with primary MD.  10.History of renal cell carcinoma status post left nephrourectomy      05/30/2006.  Outpatient follow-up Dr. Retta Diones as indicated.   DISCHARGE MEDICATIONS:  1. Discontinuation of lisinopril HCTZ.  2. Discontinuation of Verapamil.  3. Other medications are as prior to admission without change and      include:      a.     Neurontin 100 mg p.o. q.h.s. plus q.8 h p.r.n.      b.     Metformin 500 mg once daily.      c.     Detrol LA 4 mg once daily.      d.     That result 20 mg once daily.      e.     Xanax 0.51 p.o. q.6 h p.r.n. anxiety.      f.     Darvocet one tablet p.o. q.4 h p.r.n. leg pain, related to       neuropathy.      g.     Serevent MDI b.i.d.      h.      Flovent 144 mcg b.i.d.      i.     Albuterol MDI q.2 h p.r.n. (no use in the last few weeks).      j.     Eye drops, left eye as prior to admission, including       prednisolone, Vigamox and. __________.   HOSPITAL FOLLOW-UP:  Hospital follow up will be with primary care  physician Dr. Marga Melnick.  Patient to call for appointment in the  next 2-3 weeks for recheck of blood pressure and possible resumption of  antihypertensives as indicated.  Also, follow up  with Dr. Russella Dar, [facility] as needed, consults this hospitalization include [facility], Dr.  Christella Hartigan and Dr. Leone Payor.   CONDITION ON DISCHARGE:  Medically stable and improved.   HOSPITAL COURSE:  #1 - SEGMENTAL ISCHEMIC COLITIS WITH RECTAL BLEEDING.  The patient is a  pleasant 77 year old woman with multiple histories of other past medical  events.  Please see details below.  She came to the emergency room day  of admission due to abdominal pain associated with maroon and bloody  stools on three events.  She generally treats herself at home for  constipation with over-the-counter Glycolax, Citrucel and suppositories  for constipation and says that on this occasion she felt cramping and  loose diarrhea stool which appeared to be bloody.  After the third  episode, she came to the emergency room as it had not resolved on its  own.  She had previously been taking Coumadin for the last 4 months for  history of PE in November 2007, but recently discontinued this last  week.  She was admitted for further evaluation of what appeared to be  lower GI bleed, question diverticular versus other, and a GI consult was  obtained.  She had mild leukocytosis with a white count of 14.3,  initially normal hemoglobin, and a CT abdomen and pelvis showed sigmoid  inflammation, clinically consistent with mild ischemic colitis.  She was  treated with IV fluids and monitored with serial H&H which overall  remained stable after a small precipitous drop  occurred into the low 11  range.  Hemodynamically, she appeared stable.  Although, after her usual  antihypertensive medications on the 14th, she did become hypotensive  with a systolic pressure in the 80s.  However, was asymptomatic at that  time.  She had no further bleeding or bowel movements during this  hospitalization and on this day of discharge is anxious for discharge  home.  We have held her antihypertensives for the last 24 hours with a  normal blood pressure on the day of admission at 123/76.  The patient  has been instructed to continue holding her usual antihypertensives due  to hypotension in the setting of symptomatic ischemic colitis until  further evaluation by her primary MD.  She has been instructed to  continue her usual bowel regimen for constipation at home and to contact  her primary MD or GI physician if there are recurrent problems.  It is  not felt that there is any infectious issue and antibiotics which were  initially begun have been discontinued prior to discharge.  These  instructions have been reviewed with the patient as well as her husband  who agreed to the plan and understand need for continued observation and  follow-up.  #2 - OTHER MEDICAL ISSUES.  The patient's other medical issues are as  listed above.  Her medications are as prior to admission without change  except for the holding of her antihypertensives      Valerie A. Felicity Coyer, MD  Electronically Signed     VAL/MEDQ  D:  11/11/2006  T:  11/11/2006  Job:  [id number]

## 2010-12-14 NOTE — Letter (Signed)
July 03, 2006    Bertram Millard. Dahlstedt, M.D.  [address]  [place]  [place] [address]   RE:  Alexa, [name]  MRN:  [id number]  /  DOB:  10-Feb-1930   Dear Dr. Retta Diones:   Alexa [name] was seen July 01, 2006 following hospitalization for  pulmonary emboli.  On November 16, she was hospitalized with pulmonary  thrombo-emboli with infarctionin the context of  nephrectomy on May 30, 2006. Those extensive records were reviewed.   She was readmitted to the emergency room on November 26th for persistent  left lower lobe airspace disease with effusion.  Thoracentesis revealed  750 cc of bloody fluid felt to be related to the infarct.   Her PT/INR was 2.5 on November 23rd.   Since discharge she had not been using her incentive spirometer.  She  indicated she had several incentive spirometers but could not find them.  She stated that she able to breathe deeply and was using a nebulizer.   She described intermittent hematuria for 5 years.  She questioned  whether this represented the first signs of renal cancer, rather than  urinary tract infections which were treated.  I reviewed the most recent  urine culture results, which revealed over 100,000 E. coli.  I reminded  her that a renal tumor was found incidentally during the evaluation of  rectal bleeding and low back pain with paresthesias in her feet.  I  expressed the opinion that she was extremely lucky that the tumor had  been found incidentally.  She did not have flank pain  and had not had  significant constitutional symptoms.  Please see the dictation  concerning her April 07, 2006 visit, for further clarification.   She also questioned whether her pulmonary thrombo-emboli could be  related to her prior estrogen therapy.  I stated that this was more of  an issue in the 1950s and 60s when high-dose estrogens were taken,  particularly in smoking females.   The paresthesias which were so incapacitating are  no longer an issue.  She is not taking Gabapentin.  She made the comment that prayer cured  this.  She has had significant weight loss.  On September 20, her  weight was 153, presently is 142.  Pulse was 56 and regular, respiratory  rate 16 and unlabored.  Blood pressure was 100/60.  Cheeks were somewhat  sunken.  Affect was markedly flat.  Breath sounds were decreased, but  there are no rubs and rales, and there was no increased work of  breathing.  Flow murmur was noted.  Homan's sign was negative.  She had  no pedal edema.  No lymphadenopathy was noted.  She seemed somewhat  vague about how she is taking the Coumadin.   Her labs were reviewed with her, and Coumadin changed to 5 mg daily  except 1-1/2 (7.5 mg) each Wednesday, as her INR was 2.3.  She was also  counseled by my nurse about food and change in medicines which may  possibly affect her  prothrombin times.  She was advised to get a medic  alert bracelet.   A multivitamin with iron was recommended for her anemia.  Her hematocrit  on November 30 was 31.3.   Paresthesias are no longer a problem.  These are attributed to her known  spinal stenosis.   She was evaluated by Dr. Boyd Kerbs, M.D. on November 11 for rectal  bleedingwhich he thought  was diverticular.  She  has had a colonoscopy  in 2006 by Dr. Russella Dar which revealed colon polyps.  Stools were guaiac  negative in his office.  A repeat colonoscopy  was recommended 2011.  She is to be seen, should she have recurrent rectal bleeding.  Copies of  this note will be sent to Dr. Nicholas Lose and Dr. Tasia Catchings to facilitate  continuity of care.    Sincerely,      Titus Dubin. Alwyn Ren, MD,FACP,FCCP  Electronically Signed    WFH/MedQ  DD: 07/03/2006  DT: 07/03/2006  Job #: [id number]   CC:   Gretta Cool, M.D.  Tasia Catchings, M.D.

## 2010-12-14 NOTE — Letter (Signed)
August 08, 2006    Alexa [name], M.D.  [address]  [place], [place] [address]   RE:  Alexa [name], Alexa [name]  MRN:  [id number]  /  DOB:  [birth year]   Dear Alexa [name]:   I saw [name] in followup August 05, 2006.  She was describing pain in  the lower back and LLQ which was persistent.  She also was describing  malaise and weakness.  The low back pain was worse standing; it  was  responding to her pain medications. She describes left lower quadrant  abdominal pain ever since her recent surgery.   Her prothrombin time INR was  subtherapeutic @1 .3;she has been taking  multivitamins and eating salads intermittently.  The Coumadin dose was  adjusted with followup PT in 1 week.   Her weight was essentially stable at 141.  Blood pressure was 150/92,  respiratory rate 17, and pulse 68.   Her chest was clear with no rubs and no splinting.  She had a grade 0.5  to 1 systolic heart murmur.  Homans's sign was negative.  She had no  edema.  Abdomen was soft and nontender.  Bowel sounds were active.   I have asked her to see you if the pain persists in the abdomen.   I have asked her to monitor her blood pressure and increase her  lisinopril/hydrochlorothiazide if her blood pressure averages greater  than 130/85.   Her CBC and differential was normal.  Specifically, hematocrit was 44.1.  The eosinophil count was mildly elevated at 7.1.  Potassium was 4.4, BUN  16, and creatinine 1.0.   She does have spinal stenosis and if the  LS pain persists, I would  recommend referral to a chronic pain center as she is not a  neurosurgical candidate. Pending your evaluation of the abdominal pain,  I did renew her narcotic pain medication.  I explained that it would  have an increased risk of causing constipation.  I did inform her that I  would recommend a pain center if ongoing pain medications are necessary  for the spinal stenosis related symptoms.    Sincerely,      Titus Dubin.  Alwyn Ren, MD,FACP,FCCP  Electronically Signed    WFH/MedQ  DD: 08/08/2006  DT: 08/08/2006  Job #: [id number]

## 2010-12-14 NOTE — Discharge Summary (Signed)
Alexa [name], Alexa [name]                 ACCOUNT NO.:  [id number]   MEDICAL RECORD NO.:  [id number]          PATIENT TYPE:  INP   LOCATION:  1419                         FACILITY:  [facility]   PHYSICIAN:  Bertram Millard. 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   OTHER DIAGNOSES:  1. Diabetes mellitus type 2.  2. Hypertension.  3. Chronic obstructive pulmonary disease.   PRINCIPAL PROCEDURE 06/09/2006:  1. Left nephroureterectomy, laparoscopically done.  2. Cystoscopy.  3. Left ureteroscopy.   BRIEF HISTORY:  A 77 year old female who presented to [name] in  September 2007.  Back pain was being evaluated; and MRI showed a mass  within the left renal pelvis.  Imaging by a CT scan confirmed the left  renal pelvic mass.  Cystoscopy and cytology revealed atypical cytology  with a filling defect.  She presents at this time for removal of the  left kidney and ureter, as well as confirmatory ureteroscopy.  Risks and  complications of the procedure have been discussed with the patient at  length.   PAST MEDICAL HISTORY:  Significant for diabetes, emphysema,  hypertension.  She has undergone uterine polypectomy and colonoscopy.   MEDICATIONS INCLUDE:  1. HCTZ 25 mg a day.  2. Lisinopril/hydrochlorothiazide.  3. Estradiol transdermal.  4. Verapamil.  5. Norethindrone acetate.  6. Serevent Diskus.  7. Metformin HCl.   ALLERGIES:  She is allergic CODEINE.   PHYSICAL EXAMINATION:  For the rest of the physical exam, please see the  dictated H&P.   LABS:  Preoperative evaluation revealed a CBC revealing a hematocrit of  42%, white count of 6900, platelet count of 394,000.  BMET was normal  except for a glucose of 108.  Chest x-ray revealed no active infiltrates  or metastases.   HOSPITAL COURSE:  The patient was admitted directly to the operating  room.  She underwent cystoscopy, left ureteroscopy, and then left-hand  assisted left nephroureterectomy.  She tolerated the procedure well.  Postoperative check on the night of surgery revealed the patient be  comfortable and hemodynamically stable.  Her hemoglobin remained stable  postoperatively.   She was seen by Dr. Shelle Iron on postoperative day #1 for hypotension  despite vigorous hydration.  He recommended increasing volume expansion.  She was not felt to be in significant shock.  She did have adequate  urinary output.  Renal function remained adequate with postoperative day  #2 creatinine being 1.5.  By postoperative day #3 she was felt stable  enough to be transferred to the floor.  Aggressive pulmonary toilet was  administered, the patient was ambulated; and eventually advanced to a  regular diet.  By postoperative day #5 she was doing quite well.  She  was on a regular diet, staying comfortable on p.o. pain medicines; ans  was felt adequate for discharge.  Her incisions were all healing well  without evidence of infection.   Pathology revealed  stage TIS with the patient having a 4.5 cm tumor in  the left renal pelvis.  Margins were negative.   She will follow up in 1 week for abdominal check, as well as to check  her wound.  She is discharged on her regular medications, plus  hydrocodone/ APAP 1 or 2 q.4 h. p.r.n. pain, Cephalexin 250 mg p.o. q.12  h., and a stool softener.      Bertram Millard. Dahlstedt, M.D.  Electronically Signed     SMD/MEDQ  D:  08/13/2006  T:  08/14/2006  Job:  [id number]   cc:   Titus Dubin. Alwyn Ren, MD,FACP,FCCP  [phone] [place]  [place] [address]

## 2010-12-14 NOTE — H&P (Signed)
Alexa [name], Alexa [name]                 ACCOUNT NO.:  [id number]   MEDICAL RECORD NO.:  [id number]          PATIENT TYPE:  INP   LOCATION:  0104                         FACILITY:  [facility]   PHYSICIAN:  Malcolm T. Russella Dar, M.D. [facility] OF BIRTH:  1930-05-27   DATE OF ADMISSION:  06/07/2005  DATE OF DISCHARGE:  06/07/2005                                HISTORY & PHYSICAL   CHIEF COMPLAINTS:  Lower abdominal pain since colonoscopy June 05, 2005  and pneumonia.   HISTORY:  Alexa [name] is a pleasant 77 year old white female, primary patient of  Dr. Alwyn Ren, who had undergone colonoscopy with Dr. Russella Dar on June 05, 2005  at [facility]. This was done for follow-up of adenomatous polyps. She was found to  have six polyps, all of which were removed via hot biopsy, also noted to  have sigmoid colon diverticulosis. The patient apparently developed fever  and right-sided chest discomfort on June 06, 2005 and was seen in the  emergency room, found to have a right basilar pneumonia. She was discharged  home on Avelox 400 mg daily. She called our office on the afternoon of  admission complaining of bilateral lower abdominal discomfort, rating it a  4/5 and stating that this was present over the past two days since her  colonoscopy. She says she is now uncomfortable with walking or bending to  sit. She says she has not had a bowel movement since the procedure and has  not been passing much gas. She has not had any further documented fever or  chills but says that she has had some sweats. She has been able to eat,  though her appetite is off, and she has not had any nausea or vomiting. She  complains of feeling bloated and distended. As far as her respiratory status  is concerned, again she says she has been coughing up some sputum which has  been clear, has had some sweats off and on, but no further high fever, no  rigors, has mild shortness of breath with ambulation. She seen in the  emergency room at this time,  noted to be hemodynamically stable; temperature  of 98.6, blood pressure 122/75, O2 sat is 97 on room air, pulse in the 80s.  Laboratory studies are pending at the time of this dictation. On exam, she  is mildly diffusely tender in her abdomen but has more focal right lower  quadrant tenderness which is significant and rebound in the right lower  quadrant as well.  There is concern at this time for a post polypectomy syndrome with post  cautery burn. She is admitted to the hospital for medical management.   CURRENT MEDICATIONS:  Lisinopril, verapamil, meclizine and Wellbutrin. The  patient is unable to give doses of any other medications. She is on Avelox  400 mg daily for the pneumonia.   ALLERGIES:  CODEINE.   PAST MEDICAL HISTORY:  Pertinent for hypertension, asthma, recent D&C and  history of colon polyps.   FAMILY HISTORY:  Negative for GI disease.   SOCIAL HISTORY:  The patient is married, lives with her husband and  her  granddaughter. She is a nonsmoker, nondrinker.   REVIEW OF SYSTEMS:  As outlined above. She denies any chest pain or anginal  symptoms currently. GENITOURINARY:  Negative. MUSCULOSKELETAL:  Negative.   LABORATORY DATA:  Pending.   IMPRESSION:  59.  A 77 year old female 48 hours status post colonoscopy and polypectomy x6      with lower abdominal pain, right greater than left, rule out post      polypectomy burn. Suspect associated ileus, rule out micro perforation.  2.  Right lower lobe pneumonia, community-acquired.  3.  Hypertension.  4.  History of asthma.  5.  Depression.   PLAN:  The patient is admitted to the service of Dr. Claudette Head for IV  fluid hydration, bowel rest, pain control. Will check abdominal films and  chest x-ray. Depending on results, will decide upon the indications for CT  of the abdomen and pelvis. She will be covered with IV Zosyn for the time-  being to cover both her pneumonia and possible intra-abdominal infection.  For  details, please see the orders.      Amy Esterwood, P.A.-C. [facility]      Malcolm T. Russella Dar, M.D. [facility]  Electronically Signed    AE/MEDQ  D:  06/08/2005  T:  06/08/2005  Job:  [id number]   cc:   Titus Dubin. Alwyn Ren, M.D. [facility]  [phone] [place]  [place] [address]

## 2010-12-14 NOTE — Assessment & Plan Note (Signed)
[facility]                          [facility] OFFICE NOTE   PRICILLA, [name]                        MRN:          [id number]  DATE:04/07/2006                            DOB:          1930-06-18    Alexa [name] has been seen emergently on April 04, 2006 and April 07, 2006 with multiple symptoms. On April 04, 2006, she was describing pain  in the upper extremity from the shoulder to the fingers for which she took  aspirin with some benefit. This involved the right upper extremity but  subsequently she had numbness in the left foot prompting her to go to the  emergency room. She states that the evaluation was negative and TIA was  questioned.   She states she has had such symptoms for 5-7 years intermittently. She was  evaluated with an MRI for similar symptoms two years ago.   On exam, weight was stable at 154, pulse was 16 regular, respiratory rate  was 18, blood pressure was 114/76. There were no definite neurologic or  cardiovascular findings. Her affect was somewhat flat; she described her  symptoms in almost a monotone with no significant emotion.   It was recommended she continue the enteric-coated aspirin each day.  Hemoglobin A1c was performed to assess diabetic control. This revealed a  value of 6%, which is an average sugar of 135 and a 20% increase risk. Her  TSH was therapeutic, RPR was non-reactive and B12 levels were over 2000.   Carbohydrate restriction and avoiding high fructose corn syrup in her diet  will be recommended with monitoring of A1c in 3 months. The book Sugar  Busters was recommended as a basic diet.   She returned April 07, 2006, as an emergency work-in. She was describing  left side chest pain and pain in her leg. She described the sensation of  pins and needles and pressure in her leg. The symptoms were manifested as  tingling and pain from the knees down, greater on the left than the  right.  She states that she needs support to ambulate.  She was having greater  symptoms on the left and having no significant symptoms on the left upper  extremity as previously noted.  Her last episode of chest pain had been  March 31, 2006, one week prior to her office visit. Additionally she was  describing swelling of her feet and low back pain.   Blood pressure was 130/90, pulse 84, respiratory rate 18, and weight of 155.  She exhibits S4 with slurring.  I could not appreciate edema.  She was able  to lie down and sit up without help on the examining table.  Straight leg  raising was negative.  When she attempted heel-and-toe walking, she required  help from her husband, as her gait was jerky.  Degenerative joint changes  were found on her hands.   EKG revealed no ischemia.   Gabapentin 100 mg every 8 hours was recommended for the paresthesias.   Although she has had a negative cardiac evaluation in 2001 and 2003, if  there  is a recurrence of chest pain, a reevaluation would be indicated.   Because of the gait issues and the paresthesias, an MRI was performed.  This  revealed a multilevel lumbar spondylosis with scoliosis.  Central stenosis  was present at L4-L5, where it was mild to moderate in degree.  An  incidental finding was an abnormality of the left renal pelvis, suggesting  transitional cell carcinoma.  A dedicated renal CT with contrast was  recommended.  This was attempted to be arranged, but the patient initially  declined.  I have left a message on her home phone asking her to see me to  discuss the MRI findings.  A BUN and creatinine will be done at that time.  The triage note indicates that the patient wanted to talk to her  gynecologist, Dr. Nicholas Lose, prior to pursuing the CT.                                   Titus Dubin. Alwyn Ren, MD,FACP,FCCP   WFH/MedQ  DD:  04/16/2006  DT:  04/17/2006  Job #:  [id number]   cc:   Gretta Cool, M.D.

## 2010-12-14 NOTE — Discharge Summary (Signed)
Alexa [name], Alexa [name]                 ACCOUNT NO.:  [id number]   MEDICAL RECORD NO.:  [id number]          PATIENT TYPE:  INP   LOCATION:  1410                         FACILITY:  [facility]   PHYSICIAN:  Valerie A. Felicity Coyer, MDDATE OF BIRTH:  1929-12-01   DATE OF ADMISSION:  06/23/2006  DATE OF DISCHARGE:  06/27/2006                               DISCHARGE SUMMARY   DISCHARGE DIAGNOSES:  1. Left lower lobe pulmonary embolus diagnosed November 16 with left      lower lobe infarction and effusion status post thoracentesis      November 27, continued pain control and monitoring. No signs of      empyema or infection.  2. Status post left nephrourectomy November 2 secondary to cancer;      outpatient followup urology, Dr. Retta Diones.  3. Urinary urgency, continue Detrol.  4. Peripheral neuropathy; continue Neurontin as prior to admission,      outpatient follow up.  5. Anxiety. Continue p.r.n. Xanax. Outpatient followup.  6. History of asthma. Continue home medications plus nebulized Xopenex      p.r.n.  7. Type 2 diabetes. Continue metformin as prior to admission.      Outpatient followup.  8. Hypertension, well controlled. Continue outpatient followup.   DISCHARGE MEDICATIONS:  1. OxyContin 20 mg 1 p.o. b.i.d. until lung pain resolves.  2. Dilaudid 2 mg 1 p.o. q.4h. p.r.n. breakthrough pain.  3. Detrol LA 4 mg 1 p.o. daily for bladder spasm.  4. Xanax 0.5 mg 1/2 to 1 tablet p.o. q.4-6h. p.r.n. anxiety.  5. Magic mouthwash 1 teaspoon q.4h. p.r.n. mouth irritation.   Other medications are as prior to admission and include:  1. Coumadin 5 mg p.o. daily or as directed by primary care physician,      see below.  2. Metformin 500 mg 1/2 tablet p.o. daily.  3. Xopenex 0.62 mg nebulizer q.4h. p.r.n.  4. AeroBid 2 puffs daily.  5. Serevent 2 puffs b.i.d.  6. Verapamil 180 mg p.o. daily.  7. Lisinopril 20 mg p.o. daily.   DISPOSITION:  The patient is discharged home in medically stable  condition. Her saturations are 92% on room air at rest and post  exertion. Her pain is reasonably well controlled, though she still has  reluctance to breath deeply. She is encouraged to increase activities  slowly and return to the emergency room or call M.D. office if worsening  breathing or pain or other problems prior to visit.   HOSPITAL FOLLOW UP:  To schedule with primary care physician, Dr.  Marga Melnick, for next week December 4 at 9:30 a.m.   She is felt medically stable for discharge at this time.   HOSPITAL COURSE BY PROBLEM:  1. Left lower lobe infarction with effusion. The patient is a 76-year-      old woman who has had a complicated month of November beginning      with the left nephrourectomy November 2 by urology with readmission      to [facility] upon discharge home due to shortness of breath.      Hospitalization there November  19th through the 24th for possible      hospital-acquired pneumonia status post seven days of antibiotics      as well as an indeterminate probability VQ for which heparin and      Coumadin were initiated. The patient was continued on Coumadin      after discharge and found stable for discharge home but again      returned to the emergency room, this time to [facility], on      November 26 where she had persistent left lower lobe air-space      disease with effusion. She had slight leukocytosis at 14.7 and was      admitted for evaluation of the effusion to rule out empyema. She      underwent a thoracentesis by interventional radiology on November      27 revealing 750 mL of bloody fluid. She was seen in pulmonary      consult who felt that the effusion was likely exudative related to      the infarction incurred by the left lower lobe PE as previously      diagnosed. The patient has been maintained on therapeutic levels of      Coumadin, and although was treated empirically with IV vancomycin      and Zosyn, this was discontinued after  48 hours after pulmonary      evaluation of her pleural fluid showing no evidence of empyema or      other infection. Her white count continued to trend down with a      normalized level at 5.1 on the day of discharge. Her platelets were      elevated the day of discharge at 742, likely related to acute phase      reactant of the infarction. Chest x-ray done on the day of      discharge November 30 had no significant change from that of post      thoracentesis on the 27th with mild persisting effusion and      infarction changes, which according to Dr. Delton Coombes of pulmonology are      likely to persist for some time. Should the fluid recur which may      happen, may require a recurrent thoracentesis if patient were to      become symptomatic, but at this time, her pain has been controlled      with the addition of long-acting OxyContin in low dose and p.r.n.      Dilaudid for breakthrough pain. Her O2 saturations have been low      but stable at 92% on room air at rest and post exertion and does      not qualify for home O2. At this time, the patient appears stable      for discharge home. It has been explained to patient and daughter      this remains a tenuous status, and should she have problems prior      to her appointment scheduled for early next week with the primary      care physician on Tuesday, family should return her to the      emergency room for further evaluation. The patient also has      expressed a great deal of anxiety related to recurrent      hospitalizations and illness following surgery and cancer and has      been given a short-term prescription for low-dose Xanax to help  with these symptoms of anxiety. She was seen in consultation      briefly by Dr. Retta Diones who removed the staples from her surgery      and initiated Detrol for symptoms of urgency and spasm likely      related to operative changes. She has been given a prescription for     Detrol. Urine  culture was negative for UTI.  2. Other medical issues. Other chronic medical issues are as listed      above. The patient is to resume her home medications including      Coumadin as prior to this admission. Hospital followup is scheduled      for early next week on Tuesday for review of hospitalizations with      primary care physician as well as monitoring of Coumadin for      continued anticoagulation in the next 12 months and review of other      chronic medical illness such as diabetes, peripheral neuropathy,      hypertension, and other concerns.      Valerie A. Felicity Coyer, MD  Electronically Signed     VAL/MEDQ  D:  06/27/2006  T:  06/28/2006  Job:  [id number]

## 2010-12-14 NOTE — H&P (Signed)
Alexa [name], Alexa [name]                 ACCOUNT NO.:  [id number]   MEDICAL RECORD NO.:  [id number]          PATIENT TYPE:  INP   LOCATION:  0005                         FACILITY:  [facility]   PHYSICIAN:  Bertram Millard. Dahlstedt, M.D.DATE OF BIRTH:  May 23, 1930   DATE OF ADMISSION:  06/09/2006  DATE OF DISCHARGE:                                HISTORY & PHYSICAL   CHIEF COMPLAINT:  Renal mass.   HISTORY OF PRESENT ILLNESS:  Ms. Komar is a 77 year old female who was  originally referred to Dr. Aldean Ast by Dr. Alwyn Ren for a left renal mass  found in September.  She had an MRI performed for back pain for several  years.  The MRI revealed a mass within the left renal pelvis.  She has  subsequently been imaged by a CT scan which confirmed the left renal pelvis  mass.  She has undergone cystoscopy and cytology by Dr. Aldean Ast.  This was  atypical.  Cysto was negative.  Her metastatic evaluation has otherwise been  negative.  She otherwise has been doing well since her last clinic visit.  She denies any recent fevers, chills or other changes in her medical  history.   MEDICATIONS:  See medicine reconciliation sheet.   ALLERGIES:  CODEINE.   SURGICAL HISTORY:  1. Uterine polypectomy.  2. Colonoscopy.   PAST MEDICAL HISTORY:  1. Diabetes.  2. Emphysema.  3. Hypertension.   FAMILY MEDICAL HISTORY:  significant for bladder cancer, diabetes,  hypertension, lung cancer and aneurysm.   SOCIAL HISTORY:  The patient denies any smoking history.  She otherwise  denies any alcohol or drug use.   REVIEW OF SYSTEMS:  Positive for weight loss, headaches and occasional  weakness and dizziness and fatigue.  Otherwise as above are reviewed and  negative.   PHYSICAL EXAMINATION:  VITAL SIGNS:  The patient is afebrile with stable  vital signs.  GENERAL:  She is a well-developed, well-nourished female in no acute  distress.  HEENT:  Normocephalic, atraumatic.  CARDIOVASCULAR:  Regular rate and rhythm.  PULMONARY:  Clear to auscultation bilaterally.  ABDOMEN:  Soft, obese, nontender.  EXTREMITIES:  There is no cyanosis, clubbing or edema.  PSYCHIATRIC:  Alert and oriented x3.   ASSESSMENT/PLAN:  A 77 year old female with left renal mass which appears to  be a transitional cell carcinoma.  The patient will undergo cystoscopy and  ureteroscopy and then likely left hand assisted laparoscopic  nephroureterectomy for surgical treatment of her renal mass.  She will be  admitted postoperatively for routine postoperative care.     ______________________________  Terie Purser, MD      Bertram Millard. Dahlstedt, M.D.  Electronically Signed    JH/MEDQ  D:  06/09/2006  T:  06/09/2006  Job:  [id number]

## 2010-12-14 NOTE — Assessment & Plan Note (Signed)
[facility]                                   ON-CALL NOTE   CANDIACE, WEST                          MRN:          [id number]  DATE:03/31/2006                            DOB:          05-Dec-1929    Time:  2:46 p.m.  Phone Number:  [phone]   OBJECTIVE:  The patient has numbness in her left foot with increased heart  rate.  Yesterday she had right arm numbness.  She does not think she slept  on it wrong.  She drank coffee and took 2 aspirin and her arm got better  sometime during the day.  She is aware of last night started having numbness  of the left leg and foot and felt poorly all day yesterday and again today.  The numbness of the leg and foot is still there today.  It is different from  her arm from yesterday and yesterday her arm was enough that it really was  close to being painful.  Her foot and leg are merely a distraction.  Her  pulse yesterday was 124, blood pressure was OK.  Today her pulse is in the  104 range.  She has also had a funny feeling in her chest.  She has taken 2  aspirin again today.   ASSESSMENT:  Numbness in the left side today after numbness on the right  side yesterday.   PLAN:  I really think she ought to be seen in the emergency room to rule out  transient ischemic attack, stroke.  She was no real enthused about going but  said she would go after I talked with her for a while.  The patient's home  office primary care Stewart Sasaki is Dr. Alwyn Ren.                                   Arta Silence, MD   RNS/MedQ  DD:  03/31/2006  DT:  03/31/2006  Job #:  [id number]   cc:   Titus Dubin. Alwyn Ren, MD,FACP,FCCP

## 2010-12-14 NOTE — Assessment & Plan Note (Signed)
[facility]ALTH[facility]ARE                                   ON-CALL NOTE   Alexa [name], Alexa [name]                          MRN:          [id number]  DATE:04/26/2006                            DOB:          1930-07-09    April 26, 2006 at 10:16 p.m.  Phone number [phone].  Patient of Dr.  Frederik Pear.   CHIEF COMPLAINT:  Passing blood.   Patient states that she has been passing moderate amounts of dark blood from  the rectum for several hours now.  It has been in the form of clots.  She  has been using something called miracle wax, I think she might have meant  MiraLax, over the day.  She has had no bowel movements since Thursday night  when she ate at [facility].  She has had abdominal pain for about 48  hours, pretty severely last night and she is now just passing blood clots  without a bowel movement.  Her abdomen is hurting pretty badly.  She wants  to know what she can do at home to delay care until Monday.  She wants to  know if she can use a suppository.  I told her that her symptoms were  somewhat worrisome, that I thought she needed to go to the emergency room  for evaluation. She wanted to know what they could do for her.  I said she  needed to be evaluated by the emergency room doctor to see if she needed  imaging or labs, I did not know what could be going on but that I could not  diagnose her over the phone and that I thought she needed to be evaluated  now.  She did ask that I give her Dr. Frederik Pear home phone number.  I told  her I could not do that.  She did not want to go to the emergency room but I  told her this was what I thought she should do.            ______________________________  Audrie Gallus. Tower, MD      MAT/MedQ  DD:  04/26/2006  DT:  04/28/2006  Job #:  [id number]   cc:   Titus Dubin. Alwyn Ren, MD,FACP,FCCP

## 2010-12-14 NOTE — Op Note (Signed)
[name], Alexa [name]                 ACCOUNT NO.:  [id number]   MEDICAL RECORD NO.:  [id number]          PATIENT TYPE:  AMB   LOCATION:  NESC                         FACILITY:  [facility]   PHYSICIAN:  Gretta Cool, M.D. DATE OF BIRTH:  09/04/1929   DATE OF PROCEDURE:  04/16/2005  DATE OF DISCHARGE:                                 OPERATIVE REPORT   PREOPERATIVE DIAGNOSIS:  Abnormal uterine bleeding with endometrial polyps  by ultrasound and by endometrial sampling.   POSTOPERATIVE DIAGNOSIS:  Endometrial polyps and fibroids.   PROCEDURE:  Hysteroscopy resection of multiple fibroids and polyps, total  endometrial resection for ablation.   SURGEON:  Gretta Cool, M.D.   ANESTHESIA:  MAC and paracervical block.   DESCRIPTION OF PROCEDURE:  Under excellent anesthesia as above with the  patient prepped and draped in the Allen stirrups in lithotomy position, the  cervix was progressively dilated with a series of Pratt dilators to  accommodate the 7-mm resectoscope. The cavity was then photographed and the  multiple polyps documented; fibroids were also documented. At this point the  cavity was totally resected. First, the polyps were removed, and then the  endometrium down extending 5 mm or so into the myometrium. Once the entire  endometrial cavity been resected, the cavity was treated again with  VaporTrode at 200 watts pure cut so as to eliminate any viable islands of  endometrial tissue out in the endometrial cavity. At this point, the  procedure was terminated without complication and the patient returned to  the recovery room in excellent condition. Estimated fluid deficit  approximately 450 mL.   COMPLICATIONS:  None.           ______________________________  Gretta Cool, M.D.     CWL/MEDQ  D:  04/16/2005  T:  04/16/2005  Job:  [id number]   cc:   Titus Dubin. Alwyn Ren, M.D. [facility]  [phone] [place]  [place] [address]

## 2010-12-14 NOTE — Assessment & Plan Note (Signed)
[facility]                                   ON-CALL NOTE   BEDIE, DOMINEY                          MRN:          [id number]  DATE:04/06/2006                            DOB:          1930-02-13    TELEPHONE TRIAGE NOTE:  April 06, 2006.  Time received is 11:59 a.m.  Patient Alexa [name], the caller is the same.  She sees Dr. Alwyn Ren.  Telephone [phone].   The patient is calling about a blood sugar of 192 and she is concerned about  what to do.  She has a history of diet controlled diabetes mellitus.  She  has never been on medications before.  She just saw Dr. Alwyn Ren a couple of  days ago.  Her blood sugar in the office then was 172.  She had some labs  taken but the results are still pending.  She has been complaining to him of  numbness and hurting in her legs and feet over the past few weeks.  She  denies any headache, light headedness, shortness of breath, nausea or any  other symptoms.  My response is to reassure her that this was not an  emergent problem tonight.  She should avoid carbohydrates throughout the day  and drink plenty of water.  I did urge her to contact Dr. Alwyn Ren tomorrow  for further instructions.                                   Tera Mater. Clent Ridges, MD   SAF/MedQ  DD:  04/06/2006  DT:  04/07/2006  Job #:  [id number]   cc:   Titus Dubin. Alwyn Ren, MD,FACP,FCCP

## 2010-12-14 NOTE — Op Note (Signed)
Alexa [name], Alexa [name]                 ACCOUNT NO.:  [id number]   MEDICAL RECORD NO.:  [id number]          PATIENT TYPE:  INP   LOCATION:  0156                         [facility]ACILITY:  [facility]   PHYSICIAN:  Bertram Millard. Dahlstedt, M.D.DATE OF BIRTH:  [birth year]   DATE OF PROCEDURE:  06/09/2006  DATE OF DISCHARGE:                               OPERATIVE REPORT   PREOPERATIVE DIAGNOSIS:  Left renal pelvis mass.   POSTOPERATIVE DIAGNOSIS:  Left renal pelvis mass.   PROCEDURE:  1. Cystoscopy.  2. Left ureteroscopy.  3. Left retrograde pyelogram with interpretation.  4. Left hand-assisted laparoscopic nephroureterectomy.   SURGEON:  Bertram Millard. Retta Diones, M.D.   ASSISTANT:  Terie Purser, M.D.   ANESTHESIA:  General endotracheal.   COMPLICATIONS:  None.   DRAINS:  1. A JP drain to bulb suction.  2. A Foley catheter to straight drain.   ESTIMATED BLOOD LOSS:  The estimated blood loss was approximately 500 mL   COMPLICATIONS:  None.   DISPOSITION:  Stable, to the post-anesthesia care unit.   INDICATIONS FOR PROCEDURE:  [name] is a 77 year old female who  recently underwent a CT examination which revealed the presence of a  tumor involving the left renal pelvis.  This was concerning for a  transitional cell carcinoma.  She was counseled regarding the risks and  benefits of the procedure.  She has agreed to proceed with the surgical  removal.  The full benefits and risks were explained and her consent was  obtained.   DESCRIPTION OF PROCEDURE:  The patient was brought to the operating room  and properly identified.  Time out was performed to confirm correct  patient, procedure and side.  She was administered general anesthesia  and given preoperative antibiotics and placed in the dorsal lithotomy  position and prepped and draped in a sterile fashion.  Cystoscopy was  initially performed.  There were no evidence of tumor in the bladder.  Both ureteral orifices were in their normal  anatomic position.   A left retrograde pyelogram was then performed.  This revealed evidence  of a filling defect in the left renal pelvis.  We then placed a wire  under fluoroscopic guidance to the level of the pelvis.  The flexible  ureteroscope was then placed over the wire to examine the filling  defect.  Visualization was initially somewhat limited,  secondary to the  presence of blood in the renal pelvis; however, we were able to identify  the papillary-appearing tumor in the renal pelvis, confirming the  presence of tumor.   We then elected to proceed with the nephroureterectomy portion of the  procedure.  The patient was taken out of the dorsal lithotomy position  and placed in the supine position on the operating table, with a bump on  her left side.  She was then re-prepped and re-draped in a sterile  manner.  We then began by making an approximately 7 cm incision in the  midline below the umbilicus.  Dissection was carried down through the  subcutaneous tissues.  The peritoneum was entered sharply.  There were  no adhesions noted.  A Gel Port was placed and the abdomen was  insufflated.  We then proceeded with port placement.  Two 12 mm ports  were placed.  One was placed in the midline approximately 3 cm above the  umbilicus.  The other was placed lateral to the umbilicus, approximately  one hand breadth.  We then proceeded to take down the kidney.  The white  line of Toldt was incised using the harmonic scalpel to mobilize the  colon medially.  The lateral and superior attachments to the kidney were  taken down using the harmonic scalpel.  We then continued medial  mobilization of the colon by incising a plane between the colon and  Gerota's fascia.  The colon was lifted off nicely.  We then worked  inferiorly and identified the lower pole of the kidney.  The ureter was  identified just medial to this.  This was dissected free and clipped  with a Hemolock clip.  We then  proceeded superiorly towards the renal  hilum.  The artery was first encountered.  This was in the location  posterior to the vein. The colon was well-reflected.  The artery was  initially controlled with two Hemolock clips.  We then freed up the  renal vein and isolated this nicely.  The vein was flat.  We then  proceeded to ligate and divide the vein with a vascular stapling device.  The artery was then completed by another Hemolock clip and then divided  sharply.  The remaining superior attachments were taken down using the  Harmonic device.  The adrenal gland was spared.  The kidney was freed of  its attachments and then placed in the inferior portion of the abdomen.  We then inspected the surgical bed.  In the renal hilum hemostasis was  excellent.  The was a small amount of ooze coming from the superior  resection bed.  A sheet of Surgicel was placed.  Hemostasis was then  excellent.  We then proceeded to continue to free the ureter inferiorly  to the extent that we could laparoscopically.  This was well into the  pelvis.  We then made a decision to proceed with the open portion of  procedure.   The laparoscopic instruments were removed.  The Gel Port was opened and  removed.  The infra-umbilical incision was then extended towards the  pubis.  A Bookwalter retractor was placed.  The bowel was packed  superiorly to identify the left pelvis.  We continued  dissection of the  ureter inferiorly until we encountered the bladder.  We then placed stay  sutures in the bladder, after filling with approximately 200 mL.  The  bladder was opened in the midline.  We identified the left ureteral  orifice.  A 5-French end-hole catheter was placed easily into the  ureter.  This was secured to the ureteral orifice with a Vicryl suture.  We then scored around the circumference of the ureteral orifice and this  was then dissected using a combination of blunt dissection and Bovie cautery.  At one  point during the dissection, the catheter came out of  the ureter.  A second stay stitch of #0 Vicryl was placed in the distal  aspect of the ureter.   We then returned attention to the extra-vesical portion of the ureter.  Dissection was carried out and the ureter was able to be freed up and  delivered from the bladder with the stay suture intact, confirming full  and complete removal of the ureter.  The specimen was then removed and  sent to pathology for analysis.  We proceeded to close the opening in  the bladder at the ureteral orifice with a #3-0 Vicryl suture.  At the  time of this we identified the right ureteral orifice and noted that it  was properly effluxing urine.  This was also true at the completion of  our closure.  We then proceeded to close the bladder with a #2-0 Vicryl  suture.  A Blake drain was placed through the lateral laparoscopic port.  We then proceeded to close the midline incision.  This was closed using  a #1 PDS in a running fashion.  The wound was irrigated and the skin was  closed with staples.  The remaining port sites were also closed with  staples.  There were no complications throughout the case.  All sponge  and needle counts were correct at the  end of the case.  Please note that Dr. Retta Diones was present and  participated in all aspects of this procedure, as he is the primary  surgeon.   The patient was then awoken from anesthesia and transported to the  recovery room in stable condition.     ______________________________  Terie Purser, MD      Bertram Millard. Dahlstedt, M.D.  Electronically Signed    JH/MEDQ  D:  06/10/2006  T:  06/10/2006  Job:  [id number]

## 2010-12-14 NOTE — H&P (Signed)
[place]  Patient:    Alexa [name], Alexa [name] Visit Number: [id number] MRN: [id number]          Service Type: MED Location: [phone] Attending Physician:  Justine Null Dictated by:   Justine Null, M.D. [facility] Admit Date:  08/24/2001 Discharge Date: 08/25/2001                           History and Physical  REASON FOR ADMISSION:  Chest pain.  HISTORY OF PRESENT ILLNESS:  The patient is a 77 year old woman with one week of moderate severity tightness-type pain in the chest in the substernal area. There is a associated shortness of breath and nausea.  It spontaneously resolved before arrival in the emergency department.  She saw her primary care doctor, Dr. Alwyn Ren, three days ago and he prescribed her a diuretic.  PAST MEDICAL HISTORY: 1. Asthma. 2. Hypertension. 3. Allergic to CODEINE.  MEDICATIONS:  Albuterol, Serevent, blood pressure pill, diuretic, and hormone replacement - and the patient is uncertain of the last type of these last three medications.  SOCIAL HISTORY:  The patient is married, she is a housewife, nonsmoker, her husband is here.  She does not drink alcohol.  FAMILY HISTORY:  Patients brother had sudden death at age 44.  Several other family members had heart disease at a young age.  REVIEW OF SYSTEMS:  Denies the following:  Excessive diaphoresis, vomiting, headache, abdominal pain, loss of consciousness, dysuria, bright red blood per rectum, hematuria, skin rash, and dysuria.  PHYSICAL EXAMINATION:  VITAL SIGNS:  Blood pressure 146/70, heart 82, respiratory rate 16, patient is afebrile.  GENERAL:  No distress.  SKIN:  Not diaphoretic.  HEENT:  Head is atraumatic.  Sclerae not icteric.  Pharynx clear.  NECK:  Supple.  CHEST:  Clear to auscultation.  CARDIOVASCULAR:  No JVD, no edema.  Regular rate and rhythm, no murmur.  ABDOMEN:  Soft, nontender.  No hepatosplenomegaly, no mass.  BREAST,  GYNECOLOGIC, RECTAL:  Not done at this time due to patient condition.  EXTREMITIES:  No obvious deformity of the joints.  Pedal pulses are intact.  NEUROLOGIC:  Alert, well-oriented.  Moves all fours.  Cranial nerves are grossly intact.  The patient is examined in the supine position only.  LABORATORY DATA:  Electrocardiogram shows nonspecific T wave abnormality.  Chest x-ray shows no acute disease.  IMPRESSION:  Chest pain of uncertain etiology.  PLAN: 1. Check CPKs. 2. Check troponin. 3. Cardiology consultation at the patients request. 4. Symptomatic therapy. Dictated by:   Justine Null, M.D. [facility] Attending Physician:  Justine Null DD:  08/24/01 TD:  08/24/01 Job: [id number]

## 2010-12-14 NOTE — Assessment & Plan Note (Signed)
[facility]                                 ON-CALL NOTE   [name], [name]                          MRN:          [id number]  DATE:09/02/2006                            DOB:          [birth year]    PRIMARY CARE PHYSICIAN:  Titus Dubin. Alwyn Ren, MD,FACP,FCCP.   [name] is a patient of Dr. Alwyn Ren who calls in today stating that she  is concerned that her blood pressure, i.e., 107/64, is too low.  She  reports that this morning it was actually 94/70.  After further  discussion, she revealed that she had chest pain yesterday and that she  has been having fatigue over the last couple days.   PLAN:  I advised the patient that although her blood pressure is not  significantly low, her symptoms are more concerning to me.  I advised  her that she needs to be seen immediately in the emergency department,  giving into account the chest pain and the worsening fatigue.  The  patient did express understanding and will seek medical care  immediately.     Leanne Chang, M.D.  Electronically Signed    LA/MedQ  DD: 09/02/2006  DT: 09/02/2006  Job #: [id number]

## 2010-12-14 NOTE — Consult Note (Signed)
Alexa [name], Alexa [name]                 ACCOUNT NO.:  [id number]   MEDICAL RECORD NO.:  [id number]          PATIENT TYPE:  INP   LOCATION:  1422                         FACILITY:  [facility]   PHYSICIAN:  Leslye Peer, MD    DATE OF BIRTH:  10-Jan-1930   DATE OF CONSULTATION:  06/24/2006  DATE OF DISCHARGE:                                 CONSULTATION   REASON FOR CONSULTATION:  We were asked by Dr. Rene Paci to  evaluate Alexa [name] for left-sided chest pain and an effusion.   HISTORY:  Alexa [name] is a 77 year old woman with a history of asthma  versus COPD, diabetes mellitus, hypertension, and is now status post a  left nephrectomy/ureterectomy, earlier this month for urothelial cell  cancer.  That hospitalization was complicated by a left lower lobe  pulmonary embolism identified on VQ scan and an associated questionable  pneumonia.  Alexa [name] was discharged to home on June 21, 2006 on  anticoagulation after completing 7 days of antibiotics.  Alexa [name] returned on  June 23, 2006 with left-sided pleuritic chest pain and dyspnea.  Alexa [name]  denied any cough or sputum production.  Alexa [name] had not seen any hemoptysis.  A chest x-ray was performed that showed an increasing left-sided  effusion and some left-sided atelectasis versus infiltrate.  Alexa [name] was  admitted for further care   PAST MEDICAL HISTORY:  1. Asthma.  2. Diabetes mellitus.  3. Hypertension.  4. Left renal mass diagnosed as urothelial cell cancer status post      left nephrectomy in November of 2007.  5. Uterine polypectomy.   ALLERGIES:  CODEINE.   CURRENT MEDICATIONS:  1. Flovent 1 inhalation b.i.d.  2. Sliding scale insulin.  3. Xopenex 1.25 mg t.i.d.  4. Lisinopril 20 mg daily.  5. Zosyn 3.375 grains q.6h.  6. Serevent 2 puffs b.i.d.  7. Vancomycin 1 gram q.24h.  8. Coumadin.  9. Calan 180 mg daily  10.Dilaudid p.r.n.  11.Tylenol p.r.n.   SOCIAL HISTORY:  The patient lives with Alexa [name] daughter and husband.  Alexa [name]  is a  never smoker.   FAMILY HISTORY:  Significant for bladder cancer, diabetes, hypertension,  lung cancer and an aneurysm.   PHYSICAL EXAMINATION:  GENERAL:  This is a comfortable elderly woman who  is lying in the bed.  VITAL SIGNS:  Temperature 97.8, blood pressure 137/60, heart rate 74,  SPO2 of 96% on 4 liters per minute by nasal cannula.  Respiratory rate  is 20.  HEENT:  There are no oral lesions noted.  Alexa [name] pupils are equally round  and reactive to light and accommodation.  LUNGS:  Decreased left-sided breath sounds.  Alexa [name] right side is clear.  There is no wheezing.  HEART:  Alexa [name] heart has a regular rate and rhythm without murmur.  ABDOMEN:  Soft, nontender, nondistended.  Positive bowel sounds.  EXTREMITIES:  No cyanosis, clubbing or edema.  NEUROLOGIC:  Alexa [name] is alert and oriented x3 and has a grossly nonfocal  exam.   LABORATORY DATA:  White blood cell count 6.2, hematocrit 29.6, platelets  163.  Sodium 144, potassium  3.9, chloride 106, CO2 of 29, BUN 34,  creatinine 1.0, glucose 138.  Chest x-ray on June 23, 2006 showed a  left-sided effusion with left lower lobe atelectasis versus infiltrate.  Alexa [name] chest x-ray on June 24, 2006 showed a decrease in size in the  left-sided effusion.  VQ scan from June 16, 2006 showed a left lower  lobe mismatched perfusion defect.  The scan was read as intermediate  probability for pulmonary embolism.  Pleural fluid has been obtained by  thoracentesis, and the LDH is 363 with a glucose of 80.  Blood culture  done June 23, 2006 is  pending.   IMPRESSION AND PLANS:  1. Left PE in the setting of surgery and a diagnosis of cancer, now      associated with a left-sided infarct and an exudative effusion.  We      will follow Alexa [name] labs and culture data on Alexa [name] pleural fluid.  I      significantly doubt this is an empyema or recurrent pneumonia,      given Alexa [name] normal white count, absence of cough, fever, etc.  I will      keep Alexa [name]  anticoagulated on heparin until Coumadin is deemed to be      therapeutic.  Alexa [name] will need pain control for Alexa [name] left-sided      infarction.  I would discontinue Alexa [name] antibiotics and follow Alexa [name]      chest x-ray, as Alexa [name] pleural fluid may reaccumulated  2. History of asthma.  Alexa [name] home bronchodilators include Serevent and      AeroBid, and Alexa [name] is on a good regimen at this time.  I will change      Alexa [name] standing Xopenex to p.r.n.  3. Urothelial cell versus transitional cell cancer status post left      nephrectomy.      Leslye Peer, MD  Electronically Signed     RSB/MEDQ  D:  06/24/2006  T:  06/24/2006  Job:  [id number]   cc:   Titus Dubin. Alwyn Ren, MD,FACP,FCCP  [phone] [place]  [place] [address]   Vikki Ports A. Felicity Coyer, MD  [address] [place], [place] [address]

## 2010-12-14 NOTE — Consult Note (Signed)
[place]  Patient:    Alexa [name], Alexa [name] Visit Number: [id number] MRN: [id number]          Service Type: MED Location: [phone] Attending Physician:  Justine Null Dictated by:   Veneda Melter, M.D. Proc. Date: 08/24/01 Admit Date:  08/24/2001   CC:         Titus Dubin. Alwyn Ren, M.D. [facility] Darrelyn Hillock, M.D. [facility]   Consultation Report  CHIEF COMPLAINT:  Chest pain.  HISTORY:  The patient is a 77 year old white female without prior cardiac history who presents to the emergency room with severe substernal chest discomfort. The patient reports a 58-month history of intermittent upper epigastric and lower chest pain. This occasionally radiates to her right arm or to her back. She does note some sensation of being short of breath, not being able to catch her breath, of unclear duration. She did see Dr. Alwyn Ren in the office this past Friday, and arrangements were made for her to undergo stress imaging study in the near future. Unfortunately today she noted recurrence of severe pain and increase in blood pressure prompting her to present to the emergency room. Currently she denies any discomfort. The patient started approximately 1:00 a.m. this morning. No association with food. She did take sodium bicarbonate with vinegar and sugar without relief at home. She denied any point tenderness. No syncope, presyncope. No cough, fevers or chills. She presented to the emergency room after receiving pain medication and started to feel somewhat better.  REVIEW OF SYSTEMS:  The patient does note increasing fatigability and dyspnea on exertion over the past several months that appears to have been progressing. This was particularly noticeable during holiday shopping. She has also noted some bright red blood per rectum, although she denies any melena; no dysuria or hematuria. No dizziness. She has had some headaches with her blood pressure being  elevated; no blurred vision; no syncope. She has noted some mild palpitations. All of the symptoms are negative.  PAST MEDICAL HISTORY:  Notable for asthma, hypertension, status post right lumpectomy 1970s which was considered benign.  ALLERGIES:  CODEINE.  MEDICATIONS:  Serevent and Albuterol inhalers, Lopressor 25 mg b.i.d., hydrochlorothiazide 12.5 mg q.d.  SOCIAL HISTORY:  The patient is married and lives in [place]. She is a housewife. She denies alcohol or tobacco use, however, she has an extensive 2nd-hand history due to both of her parents smoking.  FAMILY HISTORY:  She had a brother who died of sudden cardiac death at the age of 33, a nephew with 2 myocardial infarctions at the age of 47. No heart disease in her father. Mother apparently had a thoracic aortic aneurysm.  PHYSICAL EXAMINATION:  GENERAL:  She is a well-developed, well-nourished white female in no acute distress.  VITAL SIGNS:  Blood pressure 147/83, pulse 79, respirations 20.  HEENT:  Pupils are equal, round, and reactive to light. Extraocular movements are intact. Oropharynx shows no lesion.  NECK:  Supple without bruits.  HEART:  Regular rate and rhythm without murmurs.  LUNGS:  A few right basilar crackles. Otherwise no wheezing. Good air movement.  ABDOMEN:  Soft, nontender. No guarding or rebound. Good bowel sounds.  EXTREMITIES:  No peripheral edema. Peripheral pulses are 3+ and equal bilaterally. Motor strength is 5/5. Sensory is intact to light touch.  EKG:  Normal sinus rhythm, 79. No acute ischemic changes.  CHEST X-RAY:  Showed normal cardiac silhouette with no infiltrates or effusions.  LABORATORY AND ACCESSORY  DATA:  White count 80.0, hemoglobin 15.7, hematocrit 45.9, platelets 319. Sodium 141, potassium 3.7, chloride 102, BUN 15, creatinine the evening 1.1, glucose 173. Initial CK is 132, MB 2.4, troponin-I is 0.01.  ASSESSMENT/PLAN:  The patient is a 77 year old female who  presents with progressive dyspnea on exertion and substernal chest discomfort. The patients pain does seem to originate in the mid epigastrium, however, the radiation of the back as well as the arm is worrisome, coupled with her respiratory difficulties. Given her family history for cardiovascular events, I am worried that she may have underlying coronary artery disease and believe that further assessment is prudent. We discussed proceeding with stress imaging study at this point versus cardiac catheterization. The patient would like to know what her coronary status is and wishes to proceed with cardiac catheterization. This will be arranged for her. The risks, benefits, and alternatives of left heart catheterization and possible percutaneous intervention were discussed with the patient, husband and daughter, who understand and agree to proceed. We will continue anticoagulation with Lovenox and advance her anti-anginals as well as her antihypertensives as tolerated. Further risk stratification with a look at profile will be obtained, and we will proceed with cardiac catheterization per schedule. We will also obtain a chest CT to rule out pulmonary aortic pathology or pulmonary embolus and start her on aspirin as well as Protonix for possible gastritis, esophagitis, or peptic ulcer disease. ictated by:   Veneda Melter, M.D. Attending Physician:  Justine Null DD:  08/24/01 TD:  08/24/01 Job: [id number]

## 2010-12-14 NOTE — Assessment & Plan Note (Signed)
[facility]                                 ON-CALL NOTE   Alexa, [name]                          MRN:          [id number]  DATE:11/09/2006                            DOB:          [birth year]    [phone], patient of Dr. Alwyn Ren.   Patient was calling because of blood in her stools.  Advised to bring to  emergency room for evaluation.     Jeffrey A. Tawanna Cooler, MD  Electronically Signed    JAT/MedQ  DD: 11/09/2006  DT: 11/09/2006  Job #: [id number]

## 2010-12-14 NOTE — Cardiovascular Report (Signed)
. [facility]  Patient:    Alexa [name], Alexa [name] Visit Number: [id number] MRN: [id number]          Service Type: MED Location: 6500 [phone] Attending Physician:  Justine Null Dictated by:   Daisey Must, M.D. [facility] Proc. Date: 08/25/01 Admit Date:  08/24/2001 Discharge Date: 08/25/2001   CC:         Titus Dubin. Alwyn Ren, M.D. [facility]  Veneda Melter, M.D. [facility]  Cardiac Catheterization Lab   Cardiac Catheterization  PROCEDURES PERFORMED: 1. Left heart catheterization. 2. Coronary angiography. 3. Left ventriculography.  INDICATION:  Ms. [place] is a 77 year old woman who is admitted with several days of recurrent substernal chest pain.  She had recurrent chest pain while in the hospital.  She was referred for cardiac catheterization to rule out coronary artery disease.  PROCEDURAL NOTE:  A 6-French sheath was placed in the right femoral artery. Standard Judkins 6-French catheters were utilized.  CONTRAST:  Omnipaque.  COMPLICATION:  None.  RESULTS:  HEMODYNAMICS: 1. Left ventricular pressure:  174/20. 2. Aortic pressure:  164/90. There was no aortic valve gradient.  LEFT VENTRICULOGRAM:  Wall motion is normal.  Ejection fraction is calculated at 54%.  There is trace mitral regurgitation.  CORONARY ARTERIOGRAPHY: (Right dominant). 1. LEFT MAIN:  Has minor luminal irregularities in the proximal vessel, with    less than 20% stenosis in the proximal vessel. 2. LEFT ANTERIOR DESCENDING ARTERY:  The distal LAD has a 30% stenosis.  The    LAD gives rise to a normal-sized first diagonal branch and a small second    diagonal branch. 3. LEFT CIRCUMFLEX:  Ends as a single, large obtuse marginal.  The left    circumflex is free of angiographic disease. 4. RIGHT CORONARY ARTERY:  Has minor luminal irregularities in the proximal    vessel.  The right coronary artery is otherwise normal.  The distal right    coronary artery gives rise to a normal-sized  posterior descending artery, a    small first posterolateral branch and a normal-sized second posterolateral    branch.  IMPRESSIONS: 1. Left ventricular systolic function in the lower range of normal. 2. No significant coronary artery disease identified. Dictated by:   Daisey Must, M.D. [facility] Attending Physician:  Justine Null DD:  08/25/01 TD:  08/26/01 Job: [id number]

## 2010-12-14 NOTE — Discharge Summary (Signed)
NAMEAUSTINE, Alexa [name]                 ACCOUNT NO.:  [id number]   MEDICAL RECORD NO.:  [id number]          PATIENT TYPE:  INP   LOCATION:  3741                         FACILITY:  [facility]   PHYSICIAN:  Valetta Mole. Swords, MD    DATE OF BIRTH:  05/14/1930   DATE OF ADMISSION:  06/15/2006  DATE OF DISCHARGE:                               DISCHARGE SUMMARY   DISCHARGE DIAGNOSIS:  1. Pulmonary Embolus.  2. Presumed hospital acquired pneumonia status post seven days      antibiotics.  3. Type 2 Diabetes.  4. History of COPD.  5. Status post left nephro-ureterectomy on June 09, 2006.  6. Mild hematuria secondary to above (now on anticoagulation).  7. Hypertension.   DISCHARGE MEDICATIONS:  Lisinopril 20 milligrams p.o. q day, Verapamil  SR 180 milligrams p.o. q day. Serevent two puffs twice a day.  AeroBid  two puffs twice a day.  Xopenex two puffs three times a day as needed.  Warfarin 5 milligrams to be taken one tablet today, a half tablet the  next two days, then one tablet.  She is to follow up with Dr. Alwyn Ren.   CONDITION ON DISCHARGE:  Improved.  Hematuria resolved.  Shortness of  breath resolved.   FOLLOWUP:  1. Plan is to see Dr. Retta Diones for staple removal next week.  2. Dr. Alwyn Ren to follow up Monday, or Tuesday for checking of pro-time      and follow up hospitalization.   HOSPITAL PROCEDURES:  Cystogram was performed 06/17/06.  There was no  extravasation of contrast.  There was bladder spasm with only 300 cc of  contrast.  VQ Scan on June 16, 2006 demonstrated a single medium  sized profusion defect at the left base which is unmatched compared with  the ventilation images.  This is an intermediate possibility for PE.  Chest x-ray 06/15/06 demonstrated air space opacity at the left lung  base, obscuring the left hemidiaphragm.  Indistinct interstitial  prominence of the right lung base which is maybe atelectasis, or  pneumonia.   HOSPITAL LABORATORIES:  BMT on  admission was 586.  D-dimer on admission  was 3.32.  Discharge BMT normal, except for a Calcium that is 8.3.  Pro-  time at the time of discharge was with an INR of 1.9.  CBC at discharge  with a white count of 10.7, hemoglobin 10.6.   HOSPITAL COURSE:  The patient admitted to the hospital service on  06/15/06.  See admission note for details.  Patient presented with  shortness of breath and elevated D-dimer.  Was not a candidate for Cat  Scan.  Therefore a VQ Scan was done.  It demonstrated intermediate  probability.  Clinical diagnosis of pulmonary embolus was made and she  was started on anticoagulation.  She tolerated Warfarin well (see  hematuria below).   Papillary urothelial carcinoma status post left nephro-uterectomy.  This  was performed June 09, 2006.  She is to follow up with Dr.  Retta Diones.   Hematuria.  The patient with hematuria.  Hematuria was followed by  urology results at the  time of discharge.  Will need outpatient follow  up.   Fluid electrolyte nutrition.  Patient hypokinemic in the hospital, was  replaced. Normal at the time of discharge.   Discharge date, management greater than 30 minutes.  The patient will be  treated with one dose of Lovenox in the hospital prior to discharge.  She will take Warfarin 5 milligrams today.  I think this will be  adequate.  She will need early follow up with Dr. Alwyn Ren next week.   No elevated white count.  Patient has no signs, or symptoms of  infection.  It is worth noting on her temperature max was 100.6 the day  prior to discharge.  She clinically feels well.  Her exam is benign.  I  think she can go home.  She is asked to call me if she has any fever, or  shortness of breath.      Bruce Rexene Edison Swords, MD  Electronically Signed     BHS/MEDQ  D:  06/21/2006  T:  06/21/2006  Job:  [id number]   cc:   Titus Dubin. Alwyn Ren, MD,FACP,FCCP

## 2011-01-01 ENCOUNTER — Other Ambulatory Visit: Payer: Self-pay | Admitting: Internal Medicine

## 2011-01-18 ENCOUNTER — Other Ambulatory Visit: Payer: Self-pay | Admitting: Internal Medicine

## 2011-01-18 MED ORDER — DULOXETINE HCL 60 MG PO CPEP: 60.0000 mg | ORAL_CAPSULE | Freq: Every day | ORAL | Status: DC

## 2011-01-26 ENCOUNTER — Other Ambulatory Visit: Payer: Self-pay | Admitting: Internal Medicine

## 2011-01-28 ENCOUNTER — Telehealth: Payer: Self-pay | Admitting: *Deleted

## 2011-01-28 MED ORDER — DULOXETINE HCL 60 MG PO CPEP: 60.0000 mg | ORAL_CAPSULE | Freq: Every day | ORAL | Status: DC

## 2011-01-28 NOTE — Telephone Encounter (Signed)
Pt called requesting samples of Cymbalta. Gave pt 2 bottles.

## 2011-01-28 NOTE — Telephone Encounter (Signed)
Called in RX , refill error prompted me that rx did not go through and needed to be called in.

## 2011-02-08 ENCOUNTER — Emergency Department (HOSPITAL_COMMUNITY): Payer: Medicare Other

## 2011-02-08 ENCOUNTER — Emergency Department (HOSPITAL_COMMUNITY)
Admission: EM | Admit: 2011-02-08 | Discharge: 2011-02-08 | Disposition: A | Discharge status: Home / Self Care | Payer: Medicare Other | Attending: Emergency Medicine | Admitting: Emergency Medicine

## 2011-02-08 ENCOUNTER — Telehealth: Payer: Self-pay | Admitting: Internal Medicine

## 2011-02-08 DIAGNOSIS — K219 Gastro-esophageal reflux disease without esophagitis: Secondary | ICD-10-CM | POA: Insufficient documentation

## 2011-02-08 DIAGNOSIS — F341 Dysthymic disorder: Secondary | ICD-10-CM | POA: Insufficient documentation

## 2011-02-08 DIAGNOSIS — Z79899 Other long term (current) drug therapy: Secondary | ICD-10-CM | POA: Insufficient documentation

## 2011-02-08 DIAGNOSIS — R079 Chest pain, unspecified: Secondary | ICD-10-CM | POA: Insufficient documentation

## 2011-02-08 DIAGNOSIS — R5381 Other malaise: Secondary | ICD-10-CM | POA: Insufficient documentation

## 2011-02-08 DIAGNOSIS — Z86718 Personal history of other venous thrombosis and embolism: Secondary | ICD-10-CM | POA: Insufficient documentation

## 2011-02-08 DIAGNOSIS — E119 Type 2 diabetes mellitus without complications: Secondary | ICD-10-CM | POA: Insufficient documentation

## 2011-02-08 DIAGNOSIS — Z85528 Personal history of other malignant neoplasm of kidney: Secondary | ICD-10-CM | POA: Insufficient documentation

## 2011-02-08 DIAGNOSIS — S0003XA Contusion of scalp, initial encounter: Secondary | ICD-10-CM | POA: Insufficient documentation

## 2011-02-08 DIAGNOSIS — K589 Irritable bowel syndrome without diarrhea: Secondary | ICD-10-CM | POA: Insufficient documentation

## 2011-02-08 DIAGNOSIS — I10 Essential (primary) hypertension: Secondary | ICD-10-CM | POA: Insufficient documentation

## 2011-02-08 DIAGNOSIS — E785 Hyperlipidemia, unspecified: Secondary | ICD-10-CM | POA: Insufficient documentation

## 2011-02-08 DIAGNOSIS — M129 Arthropathy, unspecified: Secondary | ICD-10-CM | POA: Insufficient documentation

## 2011-02-08 DIAGNOSIS — J438 Other emphysema: Secondary | ICD-10-CM | POA: Insufficient documentation

## 2011-02-08 DIAGNOSIS — R42 Dizziness and giddiness: Secondary | ICD-10-CM | POA: Insufficient documentation

## 2011-02-08 DIAGNOSIS — W1811XA Fall from or off toilet without subsequent striking against object, initial encounter: Secondary | ICD-10-CM | POA: Insufficient documentation

## 2011-02-08 LAB — POCT I-STAT, CHEM 8
BUN: 22 mg/dL (ref 6–23)
Hemoglobin: 15.3 g/dL — ABNORMAL HIGH (ref 12.0–15.0)
Potassium: 4.2 mEq/L (ref 3.5–5.1)
Sodium: 139 mEq/L (ref 135–145)
TCO2: 25 mmol/L (ref 0–100)

## 2011-02-08 LAB — URINALYSIS, ROUTINE W REFLEX MICROSCOPIC
Bilirubin Urine: NEGATIVE
Glucose, UA: NEGATIVE mg/dL
Hgb urine dipstick: NEGATIVE
Ketones, ur: NEGATIVE mg/dL
Leukocytes, UA: NEGATIVE
Nitrite: NEGATIVE
Protein, ur: NEGATIVE mg/dL
Specific Gravity, Urine: 1.012 (ref 1.005–1.030)
Urobilinogen, UA: 0.2 mg/dL (ref 0.0–1.0)
pH: 6.5 (ref 5.0–8.0)

## 2011-02-08 LAB — TROPONIN I
Troponin I: <0.3 ng/mL
Troponin I: <0.3 ng/mL (ref <0.30)

## 2011-02-08 LAB — CK TOTAL AND CKMB (NOT AT ARMC)
CK, MB: 2.4 ng/mL (ref 0.3–4.0)
CK, MB: 2.4 ng/mL (ref 0.3–4.0)
Relative Index: INVALID (ref 0.0–2.5)
Total CK: 84 U/L (ref 7–177)
Total CK: 79 U/L (ref 7–177)

## 2011-02-08 LAB — DIFFERENTIAL
Basophils Absolute: 0.1 10*3/uL (ref 0.0–0.1)
Basophils Relative: 1 % (ref 0–1)
Lymphocytes Relative: 26 % (ref 12–46)
Neutro Abs: 4.3 10*3/uL (ref 1.7–7.7)
Neutrophils Relative %: 57 % (ref 43–77)

## 2011-02-08 LAB — CBC
Hemoglobin: 14.5 g/dL (ref 12.0–15.0)
RBC: 4.87 MIL/uL (ref 3.87–5.11)

## 2011-02-08 NOTE — Telephone Encounter (Signed)
Pt advised ER. Daughter agreed.

## 2011-02-08 NOTE — Telephone Encounter (Signed)
Patient daughter called stating patient fell this morning & hit her head - she is sweating profusely -bp is 105/56 pulse 71- she also said patient was having chest pains last night - discussed with c jones -cma - patient was told to call 911 or go to [facility] - daughter agreed she would call 911 said patient is stubborn

## 2011-02-12 ENCOUNTER — Other Ambulatory Visit: Payer: Self-pay | Admitting: Dermatology

## 2011-02-20 ENCOUNTER — Encounter: Payer: Self-pay | Admitting: Internal Medicine

## 2011-02-21 ENCOUNTER — Encounter: Payer: Self-pay | Admitting: Internal Medicine

## 2011-02-21 ENCOUNTER — Ambulatory Visit (INDEPENDENT_AMBULATORY_CARE_PROVIDER_SITE_OTHER): Payer: Medicare Other | Admitting: Internal Medicine

## 2011-02-21 DIAGNOSIS — R079 Chest pain, unspecified: Secondary | ICD-10-CM

## 2011-02-21 NOTE — Patient Instructions (Signed)
Please review the emergency room records and this note. Add any historical items  you remember about the event

## 2011-02-21 NOTE — Progress Notes (Signed)
  Subjective:    Patient ID: Alexa [name], female    DOB: 11/01/1929, 77 y.o.   MRN: [id number]  HPI Alexa [name]  is seen after being evaluated in the emergency room on July 13. She had  been having some chest pain for 2-3 weeks.While urinating she fell to her R from the commode ,striking her head. She was not straining to urinate; she denies seizure activity. She is unsure as to whether she had loss of consciousness. The cardiac enzymes and d-dimer were normal. CAT scan of the head without contrast revealed an old right cerebellar infarction. There were no acute changes.  The ER notes were reviewed. The cardiologist had recommended a stress Myoview but she wanted to be seen here first.  She was evaluated for similar chest pain a year ago. She's had no chest pain or syncope since she left the hospital.  She has worn an event monitor in the past      Review of Systems  She has had insomnia which is responding to over-the-counter "PM"  medication  She has been weak; the rest with over-the-counter medicine has helped as had coffee ingestion.  She stopped her Cymbalta because of concern about her renal function.     Objective:   Physical Exam  Gen.: Healthy and well-nourished in appearance. Alert, appropriate and cooperative throughout exam. Appears younger than age Head: Normocephalic without obvious abnormalities Lungs: Normal respiratory effort; chest expands symmetrically. Lungs are clear to auscultation without rales, wheezes, or increased work of breathing.BS decreased Heart: Normal rate and rhythm. Normal S1 and S2. No gallop, click, or rub. S4 with slurring; no  murmur.                                                                                    Musculoskeletal/extremities: No clubbing, cyanosis, edema. Marked DJD of handsTone & strength  normal. Nail health  good. Vascular: Carotid  pulses are full and equal. No bruits present. Neurologic: Alert and oriented x3. Deep tendon  reflexes symmetrical and normal.          Skin: Intact without suspicious lesions or rashes. Psych: Mood and affect are normal. Normally interactive                                                                                        Assessment & Plan:  #1 atypical chest pain, recurrent  #2 fall without definite syncope or seizure activity. Old cerebellar infarct on CAT scan  #3 mild renal insufficiency (creatinine 1.4)  Plan: Cardiology evaluation of atypical chest pain as outpatient. The Myoview stress was recommended by Dr. Daleen Squibb in emergency room. If cardiology evaluation is negative; neurology evaluation of the infarct  &  questionable loss of consciousness would be pursued.

## 2011-03-26 ENCOUNTER — Ambulatory Visit (INDEPENDENT_AMBULATORY_CARE_PROVIDER_SITE_OTHER): Payer: Medicare Other | Admitting: Cardiology

## 2011-03-26 ENCOUNTER — Encounter: Payer: Self-pay | Admitting: Cardiology

## 2011-03-26 DIAGNOSIS — R0789 Other chest pain: Secondary | ICD-10-CM | POA: Insufficient documentation

## 2011-03-26 DIAGNOSIS — R5381 Other malaise: Secondary | ICD-10-CM

## 2011-03-26 DIAGNOSIS — I10 Essential (primary) hypertension: Secondary | ICD-10-CM

## 2011-03-26 DIAGNOSIS — R5383 Other fatigue: Secondary | ICD-10-CM

## 2011-03-26 DIAGNOSIS — R079 Chest pain, unspecified: Secondary | ICD-10-CM | POA: Insufficient documentation

## 2011-03-26 DIAGNOSIS — W19XXXA Unspecified fall, initial encounter: Secondary | ICD-10-CM

## 2011-03-26 NOTE — Assessment & Plan Note (Signed)
We discussed sleep techniques and hygiene.

## 2011-03-26 NOTE — Progress Notes (Signed)
HPI The patient presents for followup after an ER visit. There was a questionable fall. This was in July. It was never clear that the patient had syncope. There is a mention in Dr. Frederik Pear note of the patient being seen in consultation by Dr. Daleen Squibb and a stress perfusion study ordered. However, this consultation was from 2011.  I cannot find any evidence that she was seen by [place] at the time of her most recent ER visit. In 2011 she had some chest pain and it was suggested that she should have a stress perfusion study. However, she did not have this. She actually does not describe chest pain currently. She doesn't describe any chest pressure, neck or arm discomfort. She describes some occasional vague orthostatic symptoms but she is not describing any presyncope or syncope. She's not been having any palpitations. She denies any new shortness of breath, PND or orthopnea. She does feel fatigue but she admits to not sleeping enough.  Allergies  Allergen Reactions  . Bupropion Hcl     rash  . Codeine     nausea    Current Outpatient Prescriptions  Medication Sig Dispense Refill  . DULoxetine (CYMBALTA) 60 MG capsule Take 1 capsule (60 mg total) by mouth daily.  14 capsule  0  . FLOVENT HFA 44 MCG/ACT inhaler INHALE 1 TO 2 PUFFS BY MOUTH TWICE A DAY IF NEEDED  10.6 g  5  . glucose blood (FREESTYLE TEST STRIPS) test strip 1 each by Other route as needed. Use as instructed       . lisinopril-hydrochlorothiazide (PRINZIDE,ZESTORETIC) 20-12.5 MG per tablet TAKE 1 TABLET BY MOUTH ONCE DAILY  90 tablet  1  . PROAIR HFA 108 (90 BASE) MCG/ACT inhaler USE AS NEEDED  8.5 g  5  . ranitidine (ZANTAC) 150 MG tablet Take 150 mg by mouth 2 (two) times daily.        . SEREVENT DISKUS 50 MCG/DOSE diskus inhaler inhale 1 puff by mouth twice a day  60 each  3  . traMADol (ULTRAM) 50 MG tablet Take 50 mg by mouth. 1/2 -1 by mouth every 6 hours as needed for pain       . verapamil (CALAN-SR) 180 MG CR tablet Take 180 mg by  mouth at bedtime.          Past Medical History  Diagnosis Date  . Type II or unspecified type diabetes mellitus without mention of complication, not stated as uncontrolled   . Chronic obstructive pulmonary disease   . Malignant neoplasm of kidney and other and unspecified urinary organs   . Personal history of venous thrombosis and embolism   . Esophageal reflux   . Pain in joint, site unspecified   . Irritable bowel syndrome   . Unspecified essential hypertension   . Other and unspecified hyperlipidemia   . Diverticulosis of colon (without mention of hemorrhage)   . Spinal stenosis     Past Surgical History  Procedure Date  . Septoplasty   . Tonsillectomy   . Breast biopsy   . Colonoscopy w/ polypectomy 2000  . Colonoscopy w/ polypectomy 07/2004    Hyperplastic polyps  . Dilation and curettage of uterus 03/2005    Uterine Polyps    ROS:  As stated in the HPI and negative for all other systems.  PHYSICAL EXAM BP 124/74  Pulse 80  Resp 16  Ht 5\' 2"  (1.575 m)  Wt 146 lb (66.225 kg)  BMI 26.70 kg/m2 GENERAL:  Well appearing  HEENT:  Pupils equal round and reactive, fundi not visualized, oral mucosa unremarkable NECK:  No jugular venous distention, waveform within normal limits, carotid upstroke brisk and symmetric, no bruits, no thyromegaly LYMPHATICS:  No cervical, inguinal adenopathy LUNGS:  Clear to auscultation bilaterally BACK:  No CVA tenderness CHEST:  Unremarkable HEART:  PMI not displaced or sustained,S1 and S2 within normal limits, no S3, no S4, no clicks, no rubs, no murmurs ABD:  Flat, positive bowel sounds normal in frequency in pitch, no bruits, no rebound, no guarding, no midline pulsatile mass, no hepatomegaly, no splenomegaly EXT:  2 plus pulses throughout, no edema, no cyanosis no clubbing, arthritic changes SKIN:  No rashes no nodules NEURO:  Cranial nerves II through XII grossly intact, motor grossly intact throughout PSYCH:  Cognitively intact,  oriented to person place and time   ASSESSMENT AND PLAN

## 2011-03-26 NOTE — Patient Instructions (Signed)
Follow up as needed.  Continue medications as listed

## 2011-03-26 NOTE — Assessment & Plan Note (Signed)
The blood pressure is at target. No change in medications is indicated. We will continue with therapeutic lifestyle changes (TLC).  

## 2011-03-26 NOTE — Assessment & Plan Note (Signed)
At this point I see no evidence of a syncopal episode.  I would not suggest further cardiac work up for this issue unless she has recurrent events.

## 2011-03-26 NOTE — Assessment & Plan Note (Signed)
I reviewed all available records.  I do not see a recent cardiology consult and the patient has had no recent chest pain.  At this point I do not think that stress testing is indicated.  She will continue with risk reduction.

## 2011-03-27 ENCOUNTER — Other Ambulatory Visit: Payer: Self-pay | Admitting: Internal Medicine

## 2011-04-04 ENCOUNTER — Telehealth: Payer: Self-pay | Admitting: *Deleted

## 2011-04-04 NOTE — Telephone Encounter (Signed)
Called to give patient status update on phone call, message still on hold for MD. Patient informed as soon as orders and DX codes given we will call to schedule lab appointment or OV if recommended by MD

## 2011-04-04 NOTE — Telephone Encounter (Signed)
Patient would like to have Thyroid Panel lab order. Her cardiologist suggested she call her PCP to check on labs, due to her consistent weakness [and fall x2 weeks ago]. Please advise.

## 2011-04-05 NOTE — Telephone Encounter (Signed)
Thyroid test had been normal in 12/11. Repeat TSH with free T4& free T#( 780.79)

## 2011-04-08 MED ORDER — ZOSTER VACCINE LIVE 19400 UNT/0.65ML ~~LOC~~ SOLR: 0.6500 mL | Freq: Once | SUBCUTANEOUS | Status: DC

## 2011-04-08 NOTE — Telephone Encounter (Signed)
Scheduled lab appointment, patient also requested rx for shingles vaccine

## 2011-04-08 NOTE — Telephone Encounter (Signed)
OK for shingles shot

## 2011-04-09 ENCOUNTER — Other Ambulatory Visit (INDEPENDENT_AMBULATORY_CARE_PROVIDER_SITE_OTHER): Payer: Medicare Other

## 2011-04-09 DIAGNOSIS — R5381 Other malaise: Secondary | ICD-10-CM

## 2011-04-09 NOTE — Progress Notes (Signed)
Labs only

## 2011-04-10 LAB — TSH: TSH: 1.75 u[IU]/mL (ref 0.35–5.50)

## 2011-05-10 LAB — BASIC METABOLIC PANEL
CO2: 22
Calcium: 9.3
Chloride: 106
GFR calc Af Amer: 51 — ABNORMAL LOW
Glucose, Bld: 97
Potassium: 3.7
Sodium: 138

## 2011-05-10 LAB — URINALYSIS, ROUTINE W REFLEX MICROSCOPIC
Bilirubin Urine: NEGATIVE
Glucose, UA: NEGATIVE
Hgb urine dipstick: NEGATIVE
Ketones, ur: NEGATIVE
Protein, ur: NEGATIVE
pH: 6.5

## 2011-05-10 LAB — CK TOTAL AND CKMB (NOT AT ARMC)
CK, MB: 2.7
Relative Index: INVALID
Relative Index: INVALID
Total CK: 78
Total CK: 82
Total CK: 82

## 2011-05-10 LAB — CBC
HCT: 39.8
Hemoglobin: 13.8
MCHC: 34.6
RBC: 4.62

## 2011-05-10 LAB — POCT CARDIAC MARKERS
Operator id: 4661
Troponin i, poc: <0.05

## 2011-05-10 LAB — TROPONIN I: Troponin I: 0.03

## 2011-05-10 LAB — D-DIMER, QUANTITATIVE: D-Dimer, Quant: 0.34

## 2011-05-10 LAB — HEMOGLOBIN A1C: Hgb A1c MFr Bld: 5.6

## 2011-05-23 ENCOUNTER — Other Ambulatory Visit: Payer: Self-pay | Admitting: Internal Medicine

## 2011-05-23 MED ORDER — DULOXETINE HCL 60 MG PO CPEP: 60.0000 mg | ORAL_CAPSULE | Freq: Every day | ORAL | Status: DC

## 2011-05-23 NOTE — Telephone Encounter (Signed)
RX sent, Dr.Hopper please advise on samples

## 2011-05-23 NOTE — Telephone Encounter (Signed)
Patient wants sample of cymbalta 30mg  --please call in refill [facility] [address]

## 2011-05-24 NOTE — Telephone Encounter (Signed)
Left message on voicemail informing patient samples placed at the front

## 2011-05-24 NOTE — Telephone Encounter (Signed)
Can give several weeks (up to 4) of samples if available; 90 day prescription to the pharmacy.

## 2011-06-04 ENCOUNTER — Ambulatory Visit (INDEPENDENT_AMBULATORY_CARE_PROVIDER_SITE_OTHER): Payer: Medicare Other | Admitting: Internal Medicine

## 2011-06-04 ENCOUNTER — Encounter: Payer: Self-pay | Admitting: Internal Medicine

## 2011-06-04 VITALS — BP 118/68 | HR 74 | Temp 97.5°F | Resp 16 | Wt 148.0 lb

## 2011-06-04 DIAGNOSIS — J441 Chronic obstructive pulmonary disease with (acute) exacerbation: Secondary | ICD-10-CM

## 2011-06-04 MED ORDER — AZITHROMYCIN 250 MG PO TABS: ORAL_TABLET | ORAL | Status: AC

## 2011-06-04 NOTE — Progress Notes (Signed)
  Subjective:    Patient ID: Alexa [name], female    DOB: April 22, 1930, 77 y.o.   MRN: [id number]  HPI Symptoms  started 2 weeks ago with postnasal dripping,  they are progressing and currently she has cough productive of yellow sputum and more wheezing than usual.  Past Medical History  Diagnosis Date  . Type II or unspecified type diabetes mellitus without mention of complication, not stated as uncontrolled   . Chronic obstructive pulmonary disease   . Malignant neoplasm of kidney and other and unspecified urinary organs   . Personal history of venous thrombosis and embolism   . Esophageal reflux   . Pain in joint, site unspecified   . Irritable bowel syndrome   . Unspecified essential hypertension   . Other and unspecified hyperlipidemia   . Diverticulosis of colon (without mention of hemorrhage)   . Spinal stenosis     Past Surgical History  Procedure Date  . Septoplasty   . Tonsillectomy   . Breast biopsy   . Colonoscopy w/ polypectomy 2000  . Colonoscopy w/ polypectomy 07/2004    Hyperplastic polyps  . Dilation and curettage of uterus 03/2005    Uterine Polyps  . Nephrectomy     right   .Review of Systems Had sore throat but that resolved.  mild low-grade fever. No nausea, vomiting, diarrhea. No myalgias No chest pain, mild shortness of breath with cough.     Objective:   Physical Exam  Constitutional: She is oriented to person, place, and time. She appears well-developed and well-nourished.  HENT:  Head: Normocephalic and atraumatic.       Right ear with wax, left ears normal. Nose is slightly congested, face symmetric and nontender to palpation.  Cardiovascular: Normal rate, regular rhythm and normal heart sounds.   No murmur heard. Pulmonary/Chest:       Few end expiratory wheezes, no crackles rales or difficulty breathing.  Musculoskeletal: She exhibits no edema.  Neurological: She is alert and oriented to person, place, and time.       Assessment &  Plan:  Mild COPD exacerbation: Symptoms consistent with mild COPD exacerbation, encouraged to use her inhalers as prescribed, will add a Z-Pak. Will call if she's not improving. See instructions

## 2011-06-04 NOTE — Patient Instructions (Signed)
Rest, fluids , tylenol For cough, take Mucinex DM twice a day as needed  Use serevent and flovent daily as prescribed and proair as needed for wheezing and congestion Take the antibiotic as prescribed ----> zithromax Call if no better in few days Call anytime if the symptoms are severe, you have high fever, short of breath, persistent chest pain

## 2011-06-29 ENCOUNTER — Other Ambulatory Visit: Payer: Self-pay | Admitting: Internal Medicine

## 2011-07-03 ENCOUNTER — Ambulatory Visit (INDEPENDENT_AMBULATORY_CARE_PROVIDER_SITE_OTHER): Payer: Medicare Other

## 2011-07-03 DIAGNOSIS — Z23 Encounter for immunization: Secondary | ICD-10-CM

## 2011-07-11 ENCOUNTER — Telehealth: Payer: Self-pay | Admitting: Internal Medicine

## 2011-07-11 NOTE — Telephone Encounter (Signed)
One month of samples if available

## 2011-07-11 NOTE — Telephone Encounter (Signed)
Dr.Hopper please advise 

## 2011-07-12 NOTE — Telephone Encounter (Signed)
Spoke with patient's husband, samples at the front ready for pick-up

## 2011-08-02 ENCOUNTER — Other Ambulatory Visit: Payer: Self-pay | Admitting: Internal Medicine

## 2011-08-02 NOTE — Telephone Encounter (Signed)
Spoke with patient, patient states she uses this for a sore throat, patient was informed she needs ov to assess sore throat. Patient states she has no other symptoms and in the past she will use magic mouth wash and her sore throat resolves.  Dr.Hopper please advise

## 2011-08-03 ENCOUNTER — Other Ambulatory Visit: Payer: Self-pay | Admitting: Internal Medicine

## 2011-08-05 ENCOUNTER — Other Ambulatory Visit: Payer: Self-pay | Admitting: Internal Medicine

## 2011-08-05 MED ORDER — MAGIC MOUTHWASH: ORAL | Status: DC

## 2011-08-05 NOTE — Telephone Encounter (Signed)
Per Dr.Hopper ok to send rx in

## 2011-08-05 NOTE — Telephone Encounter (Signed)
See previous phone note, message was sent to Dr.Hopper to advise

## 2011-08-09 ENCOUNTER — Ambulatory Visit (INDEPENDENT_AMBULATORY_CARE_PROVIDER_SITE_OTHER): Payer: Medicare Other | Admitting: Internal Medicine

## 2011-08-09 VITALS — BP 100/62 | HR 92 | Temp 98.3°F | Ht 61.5 in | Wt 146.0 lb

## 2011-08-09 DIAGNOSIS — J449 Chronic obstructive pulmonary disease, unspecified: Secondary | ICD-10-CM

## 2011-08-09 MED ORDER — AZITHROMYCIN 250 MG PO TABS: ORAL_TABLET | ORAL | Status: AC

## 2011-08-09 MED ORDER — DULOXETINE HCL 30 MG PO CPEP: 30.0000 mg | ORAL_CAPSULE | Freq: Every day | ORAL | Status: DC

## 2011-08-09 NOTE — Progress Notes (Signed)
  Subjective:    Patient ID: Alexa [name], female    DOB: 04-15-1930, 78 y.o.   MRN: [id number]  HPI Acute visit Symptoms the started 5 days ago :  persistent cough, yellow sputum, wheezing more than baseline. Good compliance with routine medicines.  Past Medical History  Diagnosis Date  . Type II or unspecified type diabetes mellitus without mention of complication, not stated as uncontrolled   . Chronic obstructive pulmonary disease   . Malignant neoplasm of kidney and other and unspecified urinary organs   . Personal history of venous thrombosis and embolism   . Esophageal reflux   . Pain in joint, site unspecified   . Irritable bowel syndrome   . Unspecified essential hypertension   . Other and unspecified hyperlipidemia   . Diverticulosis of colon (without mention of hemorrhage)   . Spinal stenosis    Past Surgical History  Procedure Date  . Septoplasty   . Tonsillectomy   . Breast biopsy   . Colonoscopy w/ polypectomy 2000  . Colonoscopy w/ polypectomy 07/2004    Hyperplastic polyps  . Dilation and curettage of uterus 03/2005    Uterine Polyps  . Nephrectomy     right     Review of Systems No fevers, mild chills. No hemoptysis No chest pain Shortness of breath slightly above baseline.     Objective:   Physical Exam  Constitutional: She appears well-developed and well-nourished.  HENT:  Head: Normocephalic and atraumatic.  Right Ear: External ear normal.  Left Ear: External ear normal.       Face symmetric, nontender to palpation. Throat without redness or discharge  Cardiovascular: Normal rate, regular rhythm, normal heart sounds and intact distal pulses.   Pulmonary/Chest:       Few rhonchi, few end expiratory wheezing      Assessment & Plan:

## 2011-08-09 NOTE — Assessment & Plan Note (Signed)
Presents again with a mild COPD exacerbation, see instructions. Also recommend to see her primary doctor for a routine visit

## 2011-08-09 NOTE — Patient Instructions (Signed)
Rest, fluids , tylenol For cough, take Mucinex DM twice a day as needed  For wheezing, use albuterol every 4 to 6 hours as needed, continue w/ Flovent-Serevent Take the antibiotic as prescribed ----> Zithromax Call if no better in few days Call anytime if the symptoms are severe YOU ARE DUE FOR A ROUTINE VISIT WITH [facility], PLEASE SCHEDULE IT

## 2011-08-10 ENCOUNTER — Encounter: Payer: Self-pay | Admitting: Internal Medicine

## 2011-08-29 ENCOUNTER — Other Ambulatory Visit: Payer: Self-pay | Admitting: Gynecology

## 2011-09-10 ENCOUNTER — Telehealth: Payer: Self-pay | Admitting: Internal Medicine

## 2011-09-10 NOTE — Telephone Encounter (Signed)
Patient is requesting samples of cymbalta 30mg 

## 2011-09-10 NOTE — Telephone Encounter (Signed)
Left message on voicemail informing patient samples ready for pick-up

## 2011-09-10 NOTE — Telephone Encounter (Signed)
#  28as samples

## 2011-10-08 ENCOUNTER — Ambulatory Visit (INDEPENDENT_AMBULATORY_CARE_PROVIDER_SITE_OTHER): Payer: Medicare Other | Admitting: Internal Medicine

## 2011-10-08 ENCOUNTER — Encounter: Payer: Self-pay | Admitting: Internal Medicine

## 2011-10-08 DIAGNOSIS — K219 Gastro-esophageal reflux disease without esophagitis: Secondary | ICD-10-CM

## 2011-10-08 DIAGNOSIS — I10 Essential (primary) hypertension: Secondary | ICD-10-CM

## 2011-10-08 DIAGNOSIS — J449 Chronic obstructive pulmonary disease, unspecified: Secondary | ICD-10-CM

## 2011-10-08 DIAGNOSIS — J4489 Other specified chronic obstructive pulmonary disease: Secondary | ICD-10-CM

## 2011-10-08 DIAGNOSIS — E785 Hyperlipidemia, unspecified: Secondary | ICD-10-CM

## 2011-10-08 DIAGNOSIS — S8410XA Injury of peroneal nerve at lower leg level, unspecified leg, initial encounter: Secondary | ICD-10-CM

## 2011-10-08 DIAGNOSIS — Z0000 Encounter for general adult medical examination without abnormal findings: Secondary | ICD-10-CM

## 2011-10-08 MED ORDER — ALBUTEROL SULFATE HFA 108 (90 BASE) MCG/ACT IN AERS: INHALATION_SPRAY | RESPIRATORY_TRACT | Status: DC

## 2011-10-08 MED ORDER — SALMETEROL XINAFOATE 50 MCG/DOSE IN AEPB: INHALATION_SPRAY | RESPIRATORY_TRACT | Status: DC

## 2011-10-08 MED ORDER — LISINOPRIL-HYDROCHLOROTHIAZIDE 20-12.5 MG PO TABS: ORAL_TABLET | ORAL | Status: DC

## 2011-10-08 MED ORDER — GABAPENTIN 100 MG PO CAPS: 100.0000 mg | ORAL_CAPSULE | Freq: Three times a day (TID) | ORAL | Status: DC

## 2011-10-08 NOTE — Progress Notes (Signed)
Subjective:    Patient ID: Alexa [name], female    DOB: 11/28/1929, 78 y.o.   MRN: [id number]  HPI Medicare Wellness Visit:  The following psychosocial & medical history were reviewed as required by Medicare.   Social history: caffeine: occasional cola , alcohol:  none ,  tobacco use : never  & exercise : no.   Home & personal  safety / fall risk: unstable, fell last in July with ? syncope, activities of daily living: no limitations , seatbelt use : yes , and smoke alarm employment : yes .  Power of Attorney/Living Will status : no  Vision ( as recorded per Nurse) & Hearing  evaluation :  See exam. Orientation :oriented x 3  , memory & recall  : good, math testing: good,and mood & affect : flat . Depression / anxiety: both despite Cymbalta ("making me worse; blah") Travel history : never , immunization status :up to date , transfusion history: no, and preventive health surveillance ( colonoscopies, BMD , etc as per protocol/ [facility]): colonoscopy up to date, Dental care:  Seen rarely when absolutely necessary . Chart reviewed &  Updated. Active issues reviewed & addressed.       Review of Systems  HYPERTENSION: Disease Monitoring  Blood pressure range: 134/80 on average  Chest pain: no   Dyspnea: yes due to COPD ; responsive to Serevent & Flovent  Claudication: no Medication compliance: yes Medication Side Effects  Lightheadedness: minimal              Urinary frequency: yes   Edema: no  Preventitive Healthcare:    Diet Pattern: no plan  Salt Restriction:yes   FASTING HYPERGLYCEMIA: Her fasting blood sugars average approximately 134, as high as 180 two hrs post meal. The Cymbalta has helped her peripheral neuropathy         Objective:   Physical Exam Gen.: Healthy and well-nourished in appearance. Alert, appropriate and cooperative throughout exam.Appears younger than stated age  Head: Normocephalic without obvious abnormalities  Eyes: No corneal or conjunctival inflammation  noted. Extraocular motion intact. Vision grossly normal with lenses. Ears: External  ear exam reveals no significant lesions or deformities. Canals clear .TMs normal. Hearing is grossly normal bilaterally. Nose: External nasal exam reveals no deformity or inflammation. Nasal mucosa are pink and moist. No lesions or exudates noted.  Mouth: Oral mucosa and oropharynx reveal no lesions or exudates. Teeth in good repair; upper partial. Neck: No deformities, masses, or tenderness noted. Range of motion & Thyroid  normal Lungs: Normal respiratory effort; chest expands symmetrically. Lungs are clear to auscultation without rales, wheezes, or increased work of breathing. Heart: Normal rate and rhythm. Normal S1 and S2. No gallop, click, or rub. No murmur. Abdomen: Bowel sounds normal; abdomen soft and nontender. No masses, organomegaly or hernias noted. Genitalia: Dr Alexa [name]                                     Musculoskeletal/extremities: Asymmetry ; R paraspinus muscles  > L suggesting scoliosis . No clubbing, cyanosis, edema noted. Range of motion  normal .Tone & strength  Normal.Joints: marked OA hand changes. Nail health  good. Vascular: Carotid, radial artery, dorsalis pedis and  posterior tibial pulses are full and equal. No bruits present. Neurologic: Alert and oriented x3. Deep tendon reflexes symmetrical and 1& 1/2 +          Skin: Intact without  suspicious lesions or rashes. Lymph: No cervical, axillary  lymphadenopathy present. Psych: Mood and affect are normal. Normally interactive                                                                                         Assessment & Plan:  #1 Medicare Wellness Exam; criteria met ; data entered #2 Problem List reviewed ; Assessment/ Recommendations made Plan: see Orders

## 2011-10-08 NOTE — Patient Instructions (Addendum)
Assess response to the gabapentin one every 8 hours as needed. If it is partially beneficial, it can be increased up to a total of 3 pills every 8 hours as needed. This increase of 1 pill each dose  should take place over 72 hours at least. Stop Cymbalta. Please  schedule fasting Labs : BMET,Lipids, hepatic panel, CBC & dif, TSH,A1c. PLEASE BRING THESE INSTRUCTIONS TO FOLLOW UP  LAB APPOINTMENT.This will guarantee correct labs are drawn, eliminating need for repeat blood sampling ( needle sticks ! ). Diagnoses /Codes: 790.29,401.9, 272.4, 147.82,956.21

## 2011-10-14 ENCOUNTER — Telehealth: Payer: Self-pay | Admitting: *Deleted

## 2011-10-14 ENCOUNTER — Emergency Department (HOSPITAL_COMMUNITY)
Admission: EM | Admit: 2011-10-14 | Discharge: 2011-10-14 | Disposition: A | Discharge status: Home / Self Care | Payer: Medicare Other | Attending: Emergency Medicine | Admitting: Emergency Medicine

## 2011-10-14 ENCOUNTER — Encounter (HOSPITAL_COMMUNITY): Payer: Self-pay | Admitting: Emergency Medicine

## 2011-10-14 ENCOUNTER — Emergency Department (HOSPITAL_COMMUNITY): Payer: Medicare Other

## 2011-10-14 DIAGNOSIS — R0989 Other specified symptoms and signs involving the circulatory and respiratory systems: Secondary | ICD-10-CM | POA: Insufficient documentation

## 2011-10-14 DIAGNOSIS — R05 Cough: Secondary | ICD-10-CM | POA: Insufficient documentation

## 2011-10-14 DIAGNOSIS — M792 Neuralgia and neuritis, unspecified: Secondary | ICD-10-CM

## 2011-10-14 DIAGNOSIS — R0609 Other forms of dyspnea: Secondary | ICD-10-CM | POA: Insufficient documentation

## 2011-10-14 DIAGNOSIS — K219 Gastro-esophageal reflux disease without esophagitis: Secondary | ICD-10-CM | POA: Insufficient documentation

## 2011-10-14 DIAGNOSIS — R229 Localized swelling, mass and lump, unspecified: Secondary | ICD-10-CM | POA: Insufficient documentation

## 2011-10-14 DIAGNOSIS — J45901 Unspecified asthma with (acute) exacerbation: Secondary | ICD-10-CM

## 2011-10-14 DIAGNOSIS — R059 Cough, unspecified: Secondary | ICD-10-CM | POA: Insufficient documentation

## 2011-10-14 DIAGNOSIS — I10 Essential (primary) hypertension: Secondary | ICD-10-CM | POA: Insufficient documentation

## 2011-10-14 DIAGNOSIS — T50905A Adverse effect of unspecified drugs, medicaments and biological substances, initial encounter: Secondary | ICD-10-CM

## 2011-10-14 DIAGNOSIS — J441 Chronic obstructive pulmonary disease with (acute) exacerbation: Secondary | ICD-10-CM | POA: Insufficient documentation

## 2011-10-14 DIAGNOSIS — T4275XA Adverse effect of unspecified antiepileptic and sedative-hypnotic drugs, initial encounter: Secondary | ICD-10-CM | POA: Insufficient documentation

## 2011-10-14 DIAGNOSIS — E119 Type 2 diabetes mellitus without complications: Secondary | ICD-10-CM | POA: Insufficient documentation

## 2011-10-14 DIAGNOSIS — K589 Irritable bowel syndrome without diarrhea: Secondary | ICD-10-CM | POA: Insufficient documentation

## 2011-10-14 DIAGNOSIS — IMO0002 Reserved for concepts with insufficient information to code with codable children: Secondary | ICD-10-CM | POA: Insufficient documentation

## 2011-10-14 LAB — DIFFERENTIAL
Basophils Absolute: 0.1 10*3/uL (ref 0.0–0.1)
Basophils Relative: 1 % (ref 0–1)
Eosinophils Relative: 10 % — ABNORMAL HIGH (ref 0–5)
Lymphocytes Relative: 22 % (ref 12–46)

## 2011-10-14 LAB — BASIC METABOLIC PANEL
CO2: 26 mEq/L (ref 19–32)
Calcium: 8.9 mg/dL (ref 8.4–10.5)
Creatinine, Ser: 1.08 mg/dL (ref 0.50–1.10)
GFR calc Af Amer: 55 mL/min — ABNORMAL LOW (ref >90)
GFR calc non Af Amer: 48 mL/min — ABNORMAL LOW (ref >90)
Sodium: 139 mEq/L (ref 135–145)

## 2011-10-14 LAB — CBC
MCHC: 33 g/dL (ref 30.0–36.0)
MCV: 89 fL (ref 78.0–100.0)
Platelets: 288 10*3/uL (ref 150–400)
RDW: 13.8 % (ref 11.5–15.5)
WBC: 6.5 10*3/uL (ref 4.0–10.5)

## 2011-10-14 MED ORDER — IPRATROPIUM BROMIDE 0.02 % IN SOLN
0.5000 mg | Freq: Once | RESPIRATORY_TRACT | Status: AC
  Administered 2011-10-14: 0.5 mg via RESPIRATORY_TRACT
  Filled 2011-10-14: qty 2.5

## 2011-10-14 MED ORDER — METHYLPREDNISOLONE SODIUM SUCC 125 MG IJ SOLR
125.0000 mg | Freq: Once | INTRAMUSCULAR | Status: AC
  Administered 2011-10-14: 125 mg via INTRAVENOUS
  Filled 2011-10-14: qty 2

## 2011-10-14 MED ORDER — BENZONATATE 100 MG PO CAPS
200.0000 mg | ORAL_CAPSULE | Freq: Once | ORAL | Status: AC
  Administered 2011-10-14: 200 mg via ORAL
  Filled 2011-10-14: qty 2

## 2011-10-14 MED ORDER — DIPHENHYDRAMINE HCL 25 MG PO CAPS
50.0000 mg | ORAL_CAPSULE | Freq: Once | ORAL | Status: AC
  Administered 2011-10-14: 50 mg via ORAL
  Filled 2011-10-14: qty 2

## 2011-10-14 MED ORDER — PHENYLEPHRINE-CHLORPHEN-DM 12.5-4-15 MG/5ML PO SYRP: 5.0000 mL | ORAL_SOLUTION | ORAL | Status: DC | PRN

## 2011-10-14 MED ORDER — TRAMADOL HCL 50 MG PO TABS: 50.0000 mg | ORAL_TABLET | Freq: Four times a day (QID) | ORAL | Status: AC | PRN

## 2011-10-14 MED ORDER — TRAMADOL HCL 50 MG PO TABS
50.0000 mg | ORAL_TABLET | Freq: Once | ORAL | Status: AC
  Administered 2011-10-14: 50 mg via ORAL
  Filled 2011-10-14: qty 1

## 2011-10-14 MED ORDER — FAMOTIDINE IN NACL 20-0.9 MG/50ML-% IV SOLN
20.0000 mg | Freq: Once | INTRAVENOUS | Status: AC
  Administered 2011-10-14: 20 mg via INTRAVENOUS
  Filled 2011-10-14: qty 50

## 2011-10-14 MED ORDER — ALBUTEROL SULFATE (5 MG/ML) 0.5% IN NEBU
5.0000 mg | INHALATION_SOLUTION | Freq: Once | RESPIRATORY_TRACT | Status: AC
  Administered 2011-10-14: 5 mg via RESPIRATORY_TRACT
  Filled 2011-10-14: qty 1

## 2011-10-14 MED ORDER — PREDNISONE 20 MG PO TABS: 40.0000 mg | ORAL_TABLET | Freq: Every day | ORAL | Status: DC

## 2011-10-14 NOTE — Discharge Instructions (Signed)
Asthma, Adult Asthma is caused by narrowing of the air passages in the lungs. It may be triggered by pollen, dust, animal dander, molds, some foods, respiratory infections, exposure to smoke, exercise, emotional stress or other allergens (things that cause allergic reactions or allergies). Repeat attacks are common. HOME CARE INSTRUCTIONS   Use prescription medications as ordered by your caregiver.   Avoid pollen, dust, animal dander, molds, smoke and other things that cause attacks at home and at work.   You may have fewer attacks if you decrease dust in your home. Electrostatic air cleaners may help.   It may help to replace your pillows or mattress with materials less likely to cause allergies.   Talk to your caregiver about an action plan for managing asthma attacks at home, including, the use of a peak flow meter which measures the severity of your asthma attack. An action plan can help minimize or stop the attack without having to seek medical care.   If you are not on a fluid restriction, drink 8 to 10 glasses of water each day.   Always have a plan prepared for seeking medical attention, including, calling your physician, accessing local emergency care, and calling 911 (in the [place] for a severe attack.   Discuss possible exercise routines with your caregiver.   If animal dander is the cause of asthma, you may need to get rid of pets.  SEEK MEDICAL CARE IF:   You have wheezing and shortness of breath even if taking medicine to prevent attacks.   You have muscle aches, chest pain or thickening of sputum.   Your sputum changes from clear or white to yellow, green, gray, or bloody.   You have any problems that may be related to the medicine you are taking (such as a rash, itching, swelling or trouble breathing).  SEEK IMMEDIATE MEDICAL CARE IF:   Your usual medicines do not stop your wheezing or there is increased coughing and/or shortness of breath.   You have increased  difficulty breathing.   You have a fever.  MAKE SURE YOU:   Understand these instructions.   Will watch your condition.   Will get help right away if you are not doing well or get worse.  Document Released: 07/15/2005 Document Revised: 07/04/2011 Document Reviewed: 03/02/2008 [facility] Patient Information 2012 [place], [place].Drug Allergy Allergic reactions to medicines are common. Some allergic reactions are mild. A delayed type of drug allergy that occurs 1 week or more after exposure to a medicine or vaccine is called serum sickness. A life-threatening, sudden (acute) allergic reaction that involves the whole body is called anaphylaxis. CAUSES  "True" drug allergies occur when there is an allergic reaction to a medicine. This is caused by overactivity of the immune system. First, the body becomes sensitized. The immune system is triggered by your first exposure to the medicine. Following this first exposure, future exposure to the same medicine may be life-threatening. Almost any medicine can cause an allergic reaction. Common ones are:  Penicillin.   Sulfonamides (sulfa drugs).   Local anesthetics.   X-ray dyes that contain iodine.  SYMPTOMS  Common symptoms of a minor allergic reaction are:  Swelling around the mouth.   An itchy red rash or hives.   Vomiting or diarrhea.  Anaphylaxis can cause swelling of the mouth and throat. This makes it difficult to breathe and swallow. Severe reactions can be fatal within seconds, even after exposure to only a trace amount of the drug that causes the  reaction. HOME CARE INSTRUCTIONS   If you are unsure of what caused your reaction, keep a diary of foods and medicines used. Include the symptoms that followed. Avoid anything that causes reactions.   You may want to follow up with an allergy specialist after the reaction has cleared in order to be tested to confirm the allergy. It is important to confirm that your reaction is an allergy,  not just a side effect to the medicine. If you have a true allergy to a medicine, this may prevent that medicine and related medicines from being given to you when you are very ill.   If you have hives or a rash:   Take medicines as directed by your caregiver.   You may use an over-the-counter antihistamine (diphenhydramine) as needed.   Apply cold compresses to the skin or take baths in cool water. Avoid hot baths or showers.   If you are severely allergic:   Continuous observation after a severe reaction may be needed. Hospitalization is often required.   Wear a medical alert bracelet or necklace stating your allergy.   You and your family must learn how to use an anaphylaxis kit or give an epinephrine injection to temporarily treat an emergency allergic reaction. If you have had a severe reaction, always carry your epinephrine injection or anaphylaxis kit with you. This can be lifesaving if you have a severe reaction.   Do not drive or perform tasks after treatment until the medicines used to treat your reaction have worn off, or until your caregiver says it is okay.  SEEK MEDICAL CARE IF:   You think you had an allergic reaction. Symptoms usually start within 30 minutes after exposure.   Symptoms are getting worse rather than better.   You develop new symptoms.   The symptoms that brought you to your caregiver return.  SEEK IMMEDIATE MEDICAL CARE IF:   You have swelling of the mouth, difficulty breathing, or wheezing.   You have a tight feeling in your chest or throat.   You develop hives, swelling, or itching all over your body.   You develop severe vomiting or diarrhea.   You feel faint or pass out.  This is an emergency. Use your epinephrine injection or anaphylaxis kit as you have been instructed. Call for emergency medical help. Even if you improve after the injection, you need to be examined at a hospital emergency department. MAKE SURE YOU:   Understand these  instructions.   Will watch your condition.   Will get help right away if you are not doing well or get worse.  Document Released: 07/15/2005 Document Revised: 07/04/2011 Document Reviewed: 12/19/2010 [facility] Patient Information 2012 [place], [place].Neuropathic Pain We often think that pain has a physical cause. If we get rid of the cause, the pain should go away. Nerves themselves can also cause pain. It is called neuropathic pain, which means nerve abnormality. It may be difficult for the patients who have it and for the treating caregivers. Pain is usually described as acute (short-lived) or chronic (long-lasting). Acute pain is related to the physical sensations caused by an injury. It can last from a few seconds to many weeks, but it usually goes away when normal healing occurs. Chronic pain lasts beyond the typical healing time. With neuropathic pain, the nerve fibers themselves may be damaged or injured. They then send incorrect signals to other pain centers. The pain you feel is real, but the cause is not easy to find.  CAUSES  Chronic pain can result from diseases, such as diabetes and shingles (an infection related to chickenpox), or from trauma, surgery, or amputation. It can also happen without any known injury or disease. The nerves are sending pain messages, even though there is no identifiable cause for such messages.   Other common causes of neuropathy include diabetes, phantom limb pain, or Regional Pain Syndrome (RPS).   As with all forms of chronic back pain, if neuropathy is not correctly treated, there can be a number of associated problems that lead to a downward cycle for the patient. These include depression, sleeplessness, feelings of fear and anxiety, limited social interaction and inability to do normal daily activities or work.   The most dramatic and mysterious example of neuropathic pain is called "phantom limb syndrome." This occurs when an arm or a leg has been removed  because of illness or injury. The brain still gets pain messages from the nerves that originally carried impulses from the missing limb. These nerves now seem to misfire and cause troubling pain.   Neuropathic pain often seems to have no cause. It responds poorly to standard pain treatment.  Neuropathic pain can occur after:  Shingles (herpes zoster virus infection).   A lasting burning sensation of the skin, caused usually by injury to a peripheral nerve.   Peripheral neuropathy which is widespread nerve damage, often caused by diabetes or alcoholism.   Phantom limb pain following an amputation.   Facial nerve problems (trigeminal neuralgia).   Multiple sclerosis.   Reflex sympathetic dystrophy.   Pain which comes with cancer and cancer chemotherapy.   Entrapment neuropathy such as when pressure is put on a nerve such as in carpal tunnel syndrome.   Back, leg, and hip problems (sciatica).   Spine or back surgery.   HIV Infection or AIDS where nerves are infected by viruses.  Your caregiver can explain items in the above list which may apply to you. SYMPTOMS  Characteristics of neuropathic pain are:  Severe, sharp, electric shock-like, shooting, lightening-like, knife-like.   Pins and needles sensation.   Deep burning, deep cold, or deep ache.   Persistent numbness, tingling, or weakness.   Pain resulting from light touch or other stimulus that would not usually cause pain.   Increased sensitivity to something that would normally cause pain, such as a pinprick.  Pain may persist for months or years following the healing of damaged tissues. When this happens, pain signals no longer sound an alarm about current injuries or injuries about to happen. Instead, the alarm system itself is not working correctly.  Neuropathic pain may get worse instead of better over time. For some people, it can lead to serious disability. It is important to be aware that severe injury in a limb  can occur without a proper, protective pain response.Burns, cuts, and other injuries may go unnoticed. Without proper treatment, these injuries can become infected or lead to further disability. Take any injury seriously, and consult your caregiver for treatment. DIAGNOSIS  When you have a pain with no known cause, your caregiver will probably ask some specific questions:   Do you have any other conditions, such as diabetes, shingles, multiple sclerosis, or HIV infection?   How would you describe your pain? (Neuropathic pain is often described as shooting, stabbing, burning, or searing.)   Is your pain worse at any time of the day? (Neuropathic pain is usually worse at night.)   Does the pain seem to follow a certain physical pathway?  Does the pain come from an area that has missing or injured nerves? (An example would be phantom limb pain.)   Is the pain triggered by minor things such as rubbing against the sheets at night?  These questions often help define the type of pain involved. Once your caregiver knows what is happening, treatment can begin. Anticonvulsant, antidepressant drugs, and various pain relievers seem to work in some cases. If another condition, such as diabetes is involved, better management of that disorder may relieve the neuropathic pain.  TREATMENT  Neuropathic pain is frequently long-lasting and tends not to respond to treatment with narcotic type pain medication. It may respond well to other drugs such as antiseizure and antidepressant medications. Usually, neuropathic problems do not completely go away, but partial improvement is often possible with proper treatment. Your caregivers have large numbers of medications available to treat you. Do not be discouraged if you do not get immediate relief. Sometimes different medications or a combination of medications will be tried before you receive the results you are hoping for. See your caregiver if you have pain that seems  to be coming from nowhere and does not go away. Help is available.  SEEK IMMEDIATE MEDICAL CARE IF:   There is a sudden change in the quality of your pain, especially if the change is on only one side of the body.   You notice changes of the skin, such as redness, black or purple discoloration, swelling, or an ulcer.   You cannot move the affected limbs.  Document Released: 04/11/2004 Document Revised: 07/04/2011 Document Reviewed: 04/11/2004 [facility] Patient Information 2012 [place], [place].

## 2011-10-14 NOTE — Telephone Encounter (Signed)
Patient was seen in ER, will send to Dr.Hopper [place] Childes)

## 2011-10-14 NOTE — Telephone Encounter (Signed)
[facility] Triage Call Report Triage Record Num: [id number] Operator: Edgar Frisk Patient Name: Alexa [name] Call Date & Time: 10/14/2011 3:40:19AM Patient Phone: [phone] PCP: Marga Melnick Patient Gender: Female PCP Fax : [phone] Patient DOB: 02/17/1930 Practice Name: [facility] Reason for Call: Caller: [name]; PCP: Marga Melnick; CB#: [phone]; Call regarding Reaction to medication; coughing; Pt reports having swelling and SOB with wheezing tonight after taking Neurotin. No relief with Inhalers. States has swelling of eyes , mouth and tongue To ED for evaluation now Protocol(s) Used: Allergic Reaction, Severe Recommended Outcome per Protocol: See ED Immediately Reason for Outcome: Breathing problems Care Advice: ~ Another adult should drive. ~ IMMEDIATE ACTION Write down provider's name. List or place the following in a bag for transport with the patient: current prescription and/or nonprescription medications; alternative treatments, therapies and medications; and street drugs. ~ If immediately available, consider taking an appropriate dose of a nonprescription antihistamine (e.g., Benadryl) orally as directed by the label or a pharmacist. These medications may cause drowsiness and should be taken with caution by adults 75 years or older. ~ 10/14/2011 3:48:44AM

## 2011-10-14 NOTE — ED Provider Notes (Signed)
History     CSN: [id number]  Arrival date & time 10/14/11  0435   First MD Initiated Contact with Patient 10/14/11 0500      Chief Complaint  Patient presents with  . Allergic Reaction    (Consider location/radiation/quality/duration/timing/severity/associated sxs/prior treatment) HPI Patient is a 78 yo F who presents this evening complaining of an "allergic reaction" to gabapentin which she just began taking yesterday after her PCP changed her to this from the cymbalta she had been taking for neuropathy.  This change was prompted by the patient's decision to no longer take cymbalta as she felt it made her depressed.  Patient endorses sensation of chest tightness, difficulty breathing, as well as coughing akin to prior asthma exacerbations.  Patient has been using albuterol at home with little improvement.  She endorses lip swelling though this is not visible objectively.  She denies difficulty swallowing or significant breathing difficulty.  Her symptoms have been present since yesterday. She denies sick contacts or fevers.   There are no other associated or modifying factors.  Past Medical History  Diagnosis Date  . Type II or unspecified type diabetes mellitus without mention of complication, not stated as uncontrolled   . Chronic obstructive pulmonary disease   . Malignant neoplasm of kidney and other and unspecified urinary organs   . Personal history of venous thrombosis and embolism     PTE after nephrectomy  . Esophageal reflux   . Irritable bowel syndrome   . Unspecified essential hypertension   . Other and unspecified hyperlipidemia   . Diverticulosis of colon (without mention of hemorrhage)   . Spinal stenosis   . History of colonic polyps     Past Surgical History  Procedure Date  . Septoplasty   . Tonsillectomy   . Breast biopsy   . Colonoscopy w/ polypectomy 2000  . Colonoscopy w/ polypectomy 07/2004    Hyperplastic polyps  . Dilation and curettage of uterus  03/2005    Uterine Polyps  . Nephrectomy     right,Dr Dahlstedt    Family History  Problem Relation Age of Onset  . Stroke Mother     onset in 20s  . Diabetes Mother   . Stroke Brother   . Heart failure Brother   . Heart attack Other     maternal family history  . Heart failure Maternal Grandfather   . Lung cancer Father   . Heart failure Sister   . Alzheimer's disease Sister   . COPD Sister   . Cancer Mother     bladder; nephrectomy for calculi  . Cancer Mother     also uterine , TAH & BSO  . Aneurysm Mother     thoracic  . Aneurysm Brother     cns    History  Substance Use Topics  . Smoking status: Never Smoker   . Smokeless tobacco: Not on file  . Alcohol Use: No    OB History    Grav Para Term Preterm Abortions TAB SAB Ect Mult Living                  Review of Systems  Constitutional: Negative.   HENT: Negative.   Eyes: Negative.   Respiratory: Positive for cough and chest tightness.   Cardiovascular: Negative.   Gastrointestinal: Negative.   Genitourinary: Negative.   Musculoskeletal: Negative.   Skin: Negative.   Neurological: Negative.   Hematological: Negative.   Psychiatric/Behavioral: Negative.   All other systems reviewed and are negative.  Allergies  Bupropion hcl; Gabapentin; and Codeine  Home Medications   Current Outpatient Rx  Name Route Sig Dispense Refill  . ACETAMINOPHEN ER 650 MG PO TBCR Oral Take 650 mg by mouth every 8 (eight) hours as needed. For pain    . ALBUTEROL SULFATE HFA 108 (90 BASE) MCG/ACT IN AERS Inhalation Inhale 2 puffs into the lungs every 6 (six) hours as needed. For shortness of breath    . MAGIC MOUTHWASH Oral Take 5 mLs by mouth 3 (three) times daily.    Marland Kitchen ESTROGENS, CONJUGATED 0.625 MG/GM VA CREA Vaginal Place 0.5 g vaginally every other day.    Marland Kitchen FLUTICASONE PROPIONATE  HFA 44 MCG/ACT IN AERO Inhalation Inhale 1 puff into the lungs 2 (two) times daily.     Marland Kitchen LISINOPRIL-HYDROCHLOROTHIAZIDE 20-12.5 MG  PO TABS Oral Take 1 tablet by mouth daily.    Marland Kitchen POLYVINYL ALCOHOL 1.4 % OP SOLN Both Eyes Place 1 drop into both eyes 4 (four) times daily as needed. For dry eyes    . RANITIDINE HCL 150 MG PO TABS Oral Take 75 mg by mouth every 12 (twelve) hours as needed.     Marland Kitchen SALMETEROL XINAFOATE 50 MCG/DOSE IN AEPB Inhalation Inhale 1 puff into the lungs 2 (two) times daily.    . TRAMADOL HCL 50 MG PO TABS Oral Take 50 mg by mouth. 1/2 -1 by mouth every 6 hours as needed for pain     . VERAPAMIL HCL ER 180 MG PO TBCR Oral Take 180 mg by mouth daily.     Marland Kitchen GLUCOSE BLOOD VI STRP Other 1 each by Other route as needed. Use as instructed       BP 150/75  Pulse 81  Temp(Src) 98.7 F (37.1 C) (Oral)  Resp 20  Wt 140 lb (63.504 kg)  SpO2 99%  Physical Exam  Nursing note and vitals reviewed. GEN: Well-developed, well-nourished female in no distress HEENT: Atraumatic, normocephalic. Oropharynx clear without erythema EYES: PERRLA BL, no scleral icterus. NECK: Trachea midline, no meningismus CV: regular rate and rhythm. No murmurs, rubs, or gallops PULM: No respiratory distress.  No crackles, wheezes, or rales. Slightly diminished throughout GI: soft, non-tender. No guarding, rebound, or tenderness. + bowel sounds  GU: deferred Neuro: cranial nerves 2-12 intact, no abnormalities of strength or sensation, A and O x 3 MSK: Patient moves all 4 extremities symmetrically, no deformity, edema, or injury noted Skin: No rashes petechiae, purpura, or jaundice Psych: no abnormality of mood   ED Course  Procedures (including critical care time)  Labs Reviewed  DIFFERENTIAL - Abnormal; Notable for the following:    Monocytes Relative 13 (*)    Eosinophils Relative 10 (*)    All other components within normal limits  BASIC METABOLIC PANEL - Abnormal; Notable for the following:    Glucose, Bld 153 (*)    GFR calc non Af Amer 48 (*)    GFR calc Af Amer 55 (*)    All other components within normal limits  CBC     Dg Chest 2 View  10/14/2011  *RADIOLOGY REPORT*  Clinical Data: Cough, congestion and shortness of breath.  CHEST - 2 VIEW  Comparison: Chest radiograph performed 02/08/2011  Findings: The lungs are well-aerated.  Peribronchial thickening is noted.  There is no evidence of focal opacification, pleural effusion or pneumothorax.  The heart is normal in size; the mediastinal contour is within normal limits.  No acute osseous abnormalities are seen.  IMPRESSION: Peribronchial thickening noted;  no definite evidence of focal airspace consolidation.  Original Report Authenticated By: Tonia Ghent, M.D.     1. Drug reaction   2. Asthma exacerbation   3. Neuropathic pain       MDM  Patient was evaluated by myself.  Though she felt strongly that she was having a drug reaction symptoms were consistent with an asthma exacerbation possibly in association with her medication change rather than anaphylaxis.  She was given solumedrol, pepcid and benadryl as well as albuterol. CBC showed no anemia and BMP was unremarkable.  CXR was also negative.  Patient was told to d/c her gabapentin.  She has albuterol at home and was discharged with prescription for prednisone as well as tramadol to try for her neuropathic pain.  She is to f/u with her PCP for further medication recommendations for this.         Cyndra Numbers, MD 10/16/11 2036

## 2011-10-14 NOTE — ED Notes (Signed)
Pt c/o allergic reaction to neurotin. Pt c/o difficulty breathing, coughing, insomnia because of coughing, dark urine, and sore throat and swelling of mouth.

## 2011-10-22 ENCOUNTER — Other Ambulatory Visit (INDEPENDENT_AMBULATORY_CARE_PROVIDER_SITE_OTHER): Payer: Medicare Other

## 2011-10-22 DIAGNOSIS — T887XXA Unspecified adverse effect of drug or medicament, initial encounter: Secondary | ICD-10-CM

## 2011-10-22 DIAGNOSIS — I10 Essential (primary) hypertension: Secondary | ICD-10-CM

## 2011-10-22 DIAGNOSIS — E785 Hyperlipidemia, unspecified: Secondary | ICD-10-CM

## 2011-10-22 DIAGNOSIS — R7309 Other abnormal glucose: Secondary | ICD-10-CM

## 2011-10-22 DIAGNOSIS — Z79899 Other long term (current) drug therapy: Secondary | ICD-10-CM

## 2011-10-22 DIAGNOSIS — K219 Gastro-esophageal reflux disease without esophagitis: Secondary | ICD-10-CM

## 2011-10-22 LAB — HEPATIC FUNCTION PANEL
Alkaline Phosphatase: 51 U/L (ref 39–117)
Bilirubin, Direct: 0.1 mg/dL (ref 0.0–0.3)
Total Protein: 7.3 g/dL (ref 6.0–8.3)

## 2011-10-22 LAB — CBC WITH DIFFERENTIAL/PLATELET
Basophils Absolute: 0.1 10*3/uL (ref 0.0–0.1)
Eosinophils Absolute: 0.7 10*3/uL (ref 0.0–0.7)
HCT: 48.7 % — ABNORMAL HIGH (ref 36.0–46.0)
Hemoglobin: 16.2 g/dL — ABNORMAL HIGH (ref 12.0–15.0)
Lymphocytes Relative: 22.9 % (ref 12.0–46.0)
Lymphs Abs: 2.5 10*3/uL (ref 0.7–4.0)
MCHC: 33.3 g/dL (ref 30.0–36.0)
MCV: 89.8 fl (ref 78.0–100.0)
Monocytes Absolute: 0.8 10*3/uL (ref 0.1–1.0)
Neutro Abs: 6.9 10*3/uL (ref 1.4–7.7)
RDW: 14.4 % (ref 11.5–14.6)

## 2011-10-22 LAB — BASIC METABOLIC PANEL
BUN: 27 mg/dL — ABNORMAL HIGH (ref 6–23)
Chloride: 103 mEq/L (ref 96–112)
Creatinine, Ser: 1.6 mg/dL — ABNORMAL HIGH (ref 0.4–1.2)

## 2011-10-22 LAB — LIPID PANEL
Cholesterol: 199 mg/dL (ref 0–200)
LDL Cholesterol: 130 mg/dL — ABNORMAL HIGH (ref 0–99)

## 2011-11-01 ENCOUNTER — Ambulatory Visit: Payer: Medicare Other | Admitting: Internal Medicine

## 2011-11-04 ENCOUNTER — Other Ambulatory Visit: Payer: Self-pay | Admitting: Internal Medicine

## 2011-11-07 ENCOUNTER — Encounter: Payer: Self-pay | Admitting: Internal Medicine

## 2011-11-07 ENCOUNTER — Ambulatory Visit (INDEPENDENT_AMBULATORY_CARE_PROVIDER_SITE_OTHER): Payer: Medicare Other | Admitting: Internal Medicine

## 2011-11-07 VITALS — BP 128/76 | HR 86 | Wt 147.6 lb

## 2011-11-07 DIAGNOSIS — E119 Type 2 diabetes mellitus without complications: Secondary | ICD-10-CM

## 2011-11-07 DIAGNOSIS — I10 Essential (primary) hypertension: Secondary | ICD-10-CM

## 2011-11-07 DIAGNOSIS — G609 Hereditary and idiopathic neuropathy, unspecified: Secondary | ICD-10-CM

## 2011-11-07 DIAGNOSIS — T887XXA Unspecified adverse effect of drug or medicament, initial encounter: Secondary | ICD-10-CM

## 2011-11-07 DIAGNOSIS — G629 Polyneuropathy, unspecified: Secondary | ICD-10-CM

## 2011-11-07 MED ORDER — LISINOPRIL 40 MG PO TABS: ORAL_TABLET | ORAL | Status: DC

## 2011-11-07 MED ORDER — TRAMADOL HCL 50 MG PO TABS: ORAL_TABLET | ORAL | Status: DC

## 2011-11-07 NOTE — Patient Instructions (Signed)
BUN, creatinine, and GFR  all assess kidney function. To protect the kidneys it  is important to control your blood pressure and sugar. You should also stay well hydrated. Drink to thirst, up to 32 ounces of fluids a day.  Blood Pressure Goal  Ideally is an AVERAGE < 135/85. This AVERAGE should be calculated from @ least 5-7 BP readings taken @ different times of day on different days of week. You should not respond to isolated BP readings , but rather the AVERAGE for that week  Please  schedule Labs in 6 weeks : BMET,A1c. PLEASE BRING THESE INSTRUCTIONS TO FOLLOW UP  LAB APPOINTMENT.This will guarantee correct labs are drawn, eliminating need for repeat blood sampling ( needle sticks ! ). Diagnoses /Codes: 401.9,250.00. Share results with Dr Retta Diones.

## 2011-11-07 NOTE — Progress Notes (Signed)
Subjective:    Patient ID: Alexa [name], female    DOB: 01/08/1930, 78 y.o.   MRN: [id number]  HPI   Painful feet bilaterally X 4 years. Pt states pain has become worse in the past 6 months but tramadol seems to help. Pt needs a refill on this medication.  Pt does not check her blood sugar regularly at home however some random glucose checks have ranged from 174-207. Pt is concerned about having elevated blood sugars and is wondering if she is a candidate for oral therapy.  Pt does not currently take any medications for diabetes. Family history: Mom and sister both had diabetes. Pt states mom only needed oral therapy but sister needed insulin to control diabetes.  Pt has stopped cymbalta 2-3 weeks ago due to weakness, Pt started taking gabapentin X 3 days- had asthma attack and went to ED and was taken off the neurotin. In the past, pt has taken zoloft but she states that it caused some adverse effects. Pt feels like she can "think better" now that she is off the cymbalta.  Last 6 months, Pt has not had energy, does not want to garden which she normally enjoys, for two days pt hasn't wanted to get out of bed. Pt also has poor appetite and has been experiencing palpitations.Sometimes pt sleeps at night and other nights she does not which also decreases her energy level.  Pt with cold in nov - received antibiotics, cold in dec - received antibiotics, has had two kidney infections recently - received antibiotics from kidney specialist - just finished medications this past week. Pt has one kidney which is why she has a kidney specialist.  Pt had  Asthma flare with severe reaction to gabapentin Review of Systems     Objective:   Physical Exam Gen.: Healthy and well-nourished in appearance. Alert, appropriate and cooperative throughout exam. Appears younger than stated age   Eyes: No corneal or conjunctival inflammation noted. Mouth: Oral mucosa and oropharynx reveal no lesions or exudates. Teeth  in good repair. Neck: No deformities, masses, or tenderness noted. Range of motion & Thyroid normal Lungs: Normal respiratory effort; chest expands symmetrically. Lungs are clear to auscultation without rales, wheezes, or increased work of breathing. Heart: Normal rate and rhythm. Normal S1 and S2. No gallop, click, or rub. S4 w/o murmur. Abdomen: Bowel sounds normal; abdomen soft and nontender. No masses, organomegaly or hernias noted.            Musculoskeletal/extremities: No deformity or scoliosis noted of  the thoracic or lumbar spine. No clubbing, cyanosis, edema noted. OA hand changes. Nail health  Good. Corn R great toe Vascular: Carotid, radial artery, dorsalis pedis and  posterior tibial pulses are full and equal. No bruits present. Neurologic: Alert and oriented x3. Deep tendon reflexes symmetrical and normal.         Skin: Intact without suspicious lesions or rashes. Lymph: No cervical, axillary lymphadenopathy present. Psych: very anxious; some difficulty with focus  Assessment & Plan:

## 2011-11-07 NOTE — Assessment & Plan Note (Signed)
A1 C. was 6.6% on 10/22/11. No change warranted. A1c should be monitored every 4 months

## 2011-11-07 NOTE — Assessment & Plan Note (Signed)
Blood pressure is well controlled, but her creatinine has risen to 1.6 from a value of 1.08 last month. The ACE inhibitor/HCTZ will be changed to ACE inhibitor alone. Hydration will be encouraged. She could she should discontinue all nonessential medications and supplements.

## 2011-11-11 ENCOUNTER — Telehealth: Payer: Self-pay | Admitting: Internal Medicine

## 2011-11-11 NOTE — Telephone Encounter (Signed)
Patient called & would like to know if she still needs to be taking her Asprin 350MG  Please call at [phone]

## 2011-11-11 NOTE — Telephone Encounter (Signed)
Dr.Hopper please advise 

## 2011-11-11 NOTE — Telephone Encounter (Signed)
100 mg of coated aspirin a day would give her adequate protection. If she cannot find his dose, then take 81 mg aspirin daily.

## 2011-11-12 NOTE — Telephone Encounter (Signed)
Spoke with patient (husband was on the other phone as well), informed them of Dr.Hopper's response

## 2011-12-12 ENCOUNTER — Other Ambulatory Visit: Payer: Self-pay | Admitting: Internal Medicine

## 2011-12-16 ENCOUNTER — Telehealth: Payer: Self-pay | Admitting: *Deleted

## 2011-12-16 ENCOUNTER — Emergency Department (INDEPENDENT_AMBULATORY_CARE_PROVIDER_SITE_OTHER)
Admission: EM | Admit: 2011-12-16 | Discharge: 2011-12-16 | Disposition: A | Discharge status: Home / Self Care | Payer: Medicare Other | Source: Home / Self Care

## 2011-12-16 ENCOUNTER — Encounter (HOSPITAL_COMMUNITY): Payer: Self-pay

## 2011-12-16 ENCOUNTER — Telehealth: Payer: Self-pay | Admitting: Internal Medicine

## 2011-12-16 DIAGNOSIS — N390 Urinary tract infection, site not specified: Secondary | ICD-10-CM

## 2011-12-16 LAB — POCT URINALYSIS DIP (DEVICE)
Nitrite: NEGATIVE
Urobilinogen, UA: 0.2 mg/dL (ref 0.0–1.0)
pH: 7 (ref 5.0–8.0)

## 2011-12-16 MED ORDER — CEFTRIAXONE SODIUM 250 MG IJ SOLR
INTRAMUSCULAR | Status: AC
  Filled 2011-12-16: qty 250

## 2011-12-16 MED ORDER — AMPICILLIN 500 MG PO CAPS: 500.0000 mg | ORAL_CAPSULE | Freq: Four times a day (QID) | ORAL | Status: AC

## 2011-12-16 MED ORDER — LIDOCAINE HCL (PF) 1 % IJ SOLN
INTRAMUSCULAR | Status: AC
  Filled 2011-12-16: qty 5

## 2011-12-16 MED ORDER — CEFTRIAXONE SODIUM 250 MG IJ SOLR
250.0000 mg | Freq: Once | INTRAMUSCULAR | Status: AC
  Administered 2011-12-16: 250 mg via INTRAMUSCULAR

## 2011-12-16 NOTE — Discharge Instructions (Signed)
Thank you for coming in today. We will do a shot today with some pills to help the infection.  Please follow up with your Doctor Thursday.  Go to the emergency room if you have worsening fevers chills or abdominal pain. Call or go to the emergency room if you get worse, have trouble breathing, have chest pains, or palpitations.

## 2011-12-16 NOTE — ED Provider Notes (Signed)
Alexa [name] is a 78 y.o. female who presents to Urgent Care today for dysuria. Pt has had multiple UTIs recently. She previously was managed by [facility] with CIPRO for UTIs.  11 days ago she finished a course of Cipro and 3 days ago she became symptomatic again. She is experiencing burning pain urinary frequency and urgency. She denies any fevers or chills but does note back pain. She has an appointment on Thursday with urology.  She is eating and drinking well   PMH reviewed. Significant for frequent urinary tract infections ROS as above Medications reviewed. No current facility-administered medications for this encounter.   Current Outpatient Prescriptions  Medication Sig Dispense Refill  . FLOVENT HFA 44 MCG/ACT inhaler INHALE 1 TO 2 PUFFS BY MOUTH TWICE A DAY IF NEEDED  10.6 g  5  . lisinopril (PRINIVIL,ZESTRIL) 40 MG tablet This replaces lisinopril/HCT is a  30 tablet  5  . salmeterol (SEREVENT) 50 MCG/DOSE diskus inhaler Inhale 1 puff into the lungs 2 (two) times daily.      . traMADol (ULTRAM) 50 MG tablet 1/2-1 every 8 hrs prn  60 tablet  0  . acetaminophen (TYLENOL) 650 MG CR tablet Take 650 mg by mouth every 8 (eight) hours as needed. For pain      . albuterol (PROVENTIL HFA;VENTOLIN HFA) 108 (90 BASE) MCG/ACT inhaler Inhale 2 puffs into the lungs every 6 (six) hours as needed. For shortness of breath      . Alum & Mag Hydroxide-Simeth (MAGIC MOUTHWASH) SOLN Take 5 mLs by mouth 3 (three) times daily.      . chlorpheniramine-phenylephrine-dextromethorphan (RONDEC DM) 12.11-29-13 MG/5ML SYRP Take 5 mLs by mouth every 4 (four) hours as needed.  100 mL  0  . conjugated estrogens (PREMARIN) vaginal cream Place 0.5 g vaginally every other day.      . fluticasone (FLOVENT HFA) 44 MCG/ACT inhaler Inhale 1 puff into the lungs 2 (two) times daily.       Marland Kitchen glucose blood (FREESTYLE TEST STRIPS) test strip 1 each by Other route as needed. Use as instructed       . polyvinyl alcohol  (LIQUIFILM TEARS) 1.4 % ophthalmic solution Place 1 drop into both eyes 4 (four) times daily as needed. For dry eyes      . predniSONE (DELTASONE) 20 MG tablet Take 2 tablets (40 mg total) by mouth daily.  10 tablet  0  . ranitidine (ZANTAC) 150 MG tablet Take 75 mg by mouth every 12 (twelve) hours as needed.       . verapamil (CALAN-SR) 180 MG CR tablet Take 180 mg by mouth daily.       Marland Kitchen DISCONTD: DULoxetine (CYMBALTA) 30 MG capsule Take 1 capsule (30 mg total) by mouth daily.  30 capsule  0    Exam:  BP 154/73  Pulse 88  Temp(Src) 97.8 F (36.6 C) (Oral)  Resp 16  SpO2 96% Gen: Well NAD HEENT: EOMI,  MMM Lungs: CTABL Nl WOB Heart: RRR no MRG Abd: NABS, NT, ND, no costovertebral angle tenderness Exts: Non edematous BL  LE, warm and well perfused.   Results for orders placed during the hospital encounter of 12/16/11 (from the past 24 hour(s))  POCT URINALYSIS DIP (DEVICE)     Status: Abnormal   Collection Time   12/16/11  5:33 PM      Component Value Range   Glucose, UA NEGATIVE  NEGATIVE (mg/dL)   Bilirubin Urine NEGATIVE  NEGATIVE  Ketones, ur NEGATIVE  NEGATIVE (mg/dL)   Specific Gravity, Urine 1.010  1.005 - 1.030    Hgb urine dipstick TRACE (*) NEGATIVE    pH 7.0  5.0 - 8.0    Protein, ur NEGATIVE  NEGATIVE (mg/dL)   Urobilinogen, UA 0.2  0.0 - 1.0 (mg/dL)   Nitrite NEGATIVE  NEGATIVE    Leukocytes, UA NEGATIVE  NEGATIVE    Discussion with urologist show previous cultures with Escherichia coli, Klebsiella, and enterococcus. All were sensitive to ampicillin. Most recent was Escherichia coli.  No results found.  Assessment and Plan: 78 y.o. female with possible urinary tract infection.  Plan to obtain urine culture. After discussion with urology as planned for IM injection of ceftriaxone plus ampicillin.  I feel that ampicillin is warranted given recent history of enterococcus. Ceftriaxone is also reasonable given  Escherichia coli is 50% likelihood of being resistant  to ampicillin.  Plan to followup with primary care doctor as soon as possible and urology.  Discussed warning signs or symptoms that would prompt return to health care. Please see discharge instructions. Patient expresses understanding    Rodolph Bong, MD 12/16/11 [phone]

## 2011-12-16 NOTE — ED Notes (Signed)
C/o burning from umbilical area to urethral area and back pain for 3 days.  States she only has one kidney, has had 4 antibiotics since November 2012 for UTIs.  Called Dr Desmond Dike today but they didn't return her call, Dr Alwyn Ren instructed her to come here.

## 2011-12-16 NOTE — Telephone Encounter (Signed)
Caller: [place]/Patient; PCP: Marga Melnick; CB#: [phone]; ; ; Call regarding Kidney Infection, Single Kidney Present;    Pt calling   with burning with urination along with frequency since 12/14/2011.   Also with low back pain along with lower abd pain. Pt just finished Ciprofloxacin  ~ 10 days ago for bladder infection. Pt  is sched for labwork 12/12/2011 to check kidneys per Dr Alwyn Ren . Pt only has one kidney.   Pt has called her Urologist /  Dr Retta Diones this afternoon  about symptoms and  waiting to hear back from the office. RN reached See in 4hrs for UTI and low back pain per Urinary Symptoms - Female protocol . NO EPIC appts seen at [facility] nor [facility] - Lead CSR adv send to UC  if no appts. RN  called office and  Chrae/ CMA  did agree that pt needs to be seen this afternoon and  to go to  [facility] -  Contact/ address information given to pt and she agreed

## 2011-12-16 NOTE — Telephone Encounter (Signed)
Received call from Vicky with [facility], pt notes urology office per notes UTI ten days ago, post cipro, however pt has not heard back from office per current sxs, noted started sat, denies fever no vomitting, notes burning and frequency and has one kidney, no apt available in our office nor [facility], [facility] rep was advised new protocol to call office prior to sending to UC, transferred call to [facility]

## 2011-12-17 LAB — URINE CULTURE: Culture: NO GROWTH

## 2011-12-19 ENCOUNTER — Other Ambulatory Visit (INDEPENDENT_AMBULATORY_CARE_PROVIDER_SITE_OTHER): Payer: Medicare Other

## 2011-12-19 DIAGNOSIS — N009 Acute nephritic syndrome with unspecified morphologic changes: Secondary | ICD-10-CM

## 2011-12-19 DIAGNOSIS — I10 Essential (primary) hypertension: Secondary | ICD-10-CM

## 2011-12-19 DIAGNOSIS — E119 Type 2 diabetes mellitus without complications: Secondary | ICD-10-CM

## 2011-12-19 DIAGNOSIS — R7309 Other abnormal glucose: Secondary | ICD-10-CM

## 2011-12-19 DIAGNOSIS — E785 Hyperlipidemia, unspecified: Secondary | ICD-10-CM

## 2011-12-19 LAB — BASIC METABOLIC PANEL
CO2: 24 mEq/L (ref 19–32)
Calcium: 9.3 mg/dL (ref 8.4–10.5)
Chloride: 105 mEq/L (ref 96–112)
Sodium: 138 mEq/L (ref 135–145)

## 2011-12-19 LAB — CBC WITH DIFFERENTIAL/PLATELET
Basophils Absolute: 0.1 10*3/uL (ref 0.0–0.1)
Eosinophils Absolute: 0.5 10*3/uL (ref 0.0–0.7)
Hemoglobin: 14.9 g/dL (ref 12.0–15.0)
Lymphocytes Relative: 32.5 % (ref 12.0–46.0)
Monocytes Relative: 7.7 % (ref 3.0–12.0)
Neutro Abs: 3.3 10*3/uL (ref 1.4–7.7)
Neutrophils Relative %: 51.3 % (ref 43.0–77.0)
RDW: 14.1 % (ref 11.5–14.6)

## 2011-12-19 LAB — TSH: TSH: 1.02 u[IU]/mL (ref 0.35–5.50)

## 2011-12-19 LAB — HEPATIC FUNCTION PANEL
ALT: 23 U/L (ref 0–35)
Albumin: 4 g/dL (ref 3.5–5.2)
Alkaline Phosphatase: 43 U/L (ref 39–117)
Total Protein: 6.9 g/dL (ref 6.0–8.3)

## 2011-12-19 LAB — LIPID PANEL
Cholesterol: 177 mg/dL (ref 0–200)
LDL Cholesterol: 109 mg/dL — ABNORMAL HIGH (ref 0–99)
Triglycerides: 154 mg/dL — ABNORMAL HIGH (ref 0.0–149.0)
VLDL: 30.8 mg/dL (ref 0.0–40.0)

## 2011-12-19 LAB — HEMOGLOBIN A1C: Hgb A1c MFr Bld: 6.1 % (ref 4.6–6.5)

## 2011-12-19 NOTE — Progress Notes (Signed)
Labs only

## 2011-12-26 ENCOUNTER — Telehealth: Payer: Self-pay | Admitting: Internal Medicine

## 2011-12-26 NOTE — Telephone Encounter (Signed)
Spoke with patient, discussed labs. Copy to be mailed, appointment scheduled to f/u

## 2011-12-26 NOTE — Telephone Encounter (Signed)
Patient called and would like lab results from 5.23.13, patient call back # [phone], please call when you can Thanks

## 2012-01-06 ENCOUNTER — Ambulatory Visit (INDEPENDENT_AMBULATORY_CARE_PROVIDER_SITE_OTHER): Payer: Medicare Other | Admitting: Internal Medicine

## 2012-01-06 ENCOUNTER — Encounter: Payer: Self-pay | Admitting: Internal Medicine

## 2012-01-06 VITALS — BP 120/80 | HR 75 | Temp 97.8°F | Wt 145.0 lb

## 2012-01-06 DIAGNOSIS — R269 Unspecified abnormalities of gait and mobility: Secondary | ICD-10-CM

## 2012-01-06 DIAGNOSIS — E119 Type 2 diabetes mellitus without complications: Secondary | ICD-10-CM

## 2012-01-06 DIAGNOSIS — I10 Essential (primary) hypertension: Secondary | ICD-10-CM

## 2012-01-06 DIAGNOSIS — R5383 Other fatigue: Secondary | ICD-10-CM

## 2012-01-06 DIAGNOSIS — E785 Hyperlipidemia, unspecified: Secondary | ICD-10-CM

## 2012-01-06 DIAGNOSIS — R5381 Other malaise: Secondary | ICD-10-CM

## 2012-01-06 MED ORDER — ONETOUCH ULTRASOFT LANCETS MISC: Status: DC

## 2012-01-06 MED ORDER — GLUCOSE BLOOD VI STRP: ORAL_STRIP | Status: DC

## 2012-01-06 MED ORDER — TRAMADOL HCL 50 MG PO TABS: ORAL_TABLET | ORAL | Status: DC

## 2012-01-06 NOTE — Patient Instructions (Addendum)
The most common cause of elevated triglycerides is the ingestion of sugar from high fructose corn syrup sources added to processed foods & drinks.  Eat a low-fat diet with lots of fruits and vegetables, up to 7-9 servings per day. Consume less than 30 (preferably ZERO) grams of sugar per day from foods & drinks with High Fructose Corn Syrup (HFCS) sugar as #1,2,3 or # 4 on label.Whole [employer], [employer] & [employer] do not carry products with HFCS. Follow a  low carb nutrition program such as [place] or [employer]  to prevent Diabetes progression . White carbohydrates (potatoes, rice, bread, and pasta) have a high spike of sugar and a high load of sugar. For example a  baked potato has a cup of sugar and a  french fry  2 teaspoons of sugar. Yams, wild  rice, whole grained bread &  wheat pasta have been much lower spike and load of  sugar. Portions should be the size of a deck of cards or your palm.   BUN, creatinine, and GFR  all assess kidney function. To protect the kidneys it  is important to control your blood pressure and sugar. You should also stay well hydrated. Drink to thirst, up to 32 ounces of fluids a day. Please  schedule fasting Labs : Lipids,A1c. PLEASE BRING THESE INSTRUCTIONS TO FOLLOW UP  LAB APPOINTMENT in 6 months.This will guarantee correct labs are drawn, eliminating need for repeat blood sampling ( needle sticks ! ). Diagnoses /Codes: 272.4, 790.29.

## 2012-01-06 NOTE — Progress Notes (Signed)
Addended by: Candie Echevaria L on: 01/06/2012 12:10 PM   Modules accepted: Orders

## 2012-01-06 NOTE — Progress Notes (Signed)
  Subjective:    Patient ID: Alexa [name], female    DOB: May 05, 1930, 78 y.o.   MRN: [id number]  HPI  DIABETES: Disease Monitoring: Blood Sugar - FBS  130 average  Polyuria/phagia/dipsia- no      Visual problems- no Medications: Compliance- diet only Hypoglycemic symptoms- no  HYPERLIPIDEMIA: Disease Monitoring: See symptoms for Hypertension Medications: Compliance- no meds  Abd pain, bowel changes- some constipation  Muscle aches- no   HYPERTENSION: Disease Monitoring:  Blood pressure range: 135-140/ 85  Chest pain: occasional "tingling " in chest   Dyspnea:no  Claudication: no  Medication compliance: yes Medication Side Effects  Lightheadedness: minor   Urinary frequency:improving  Edema: no  Preventitive Healthcare:  Exercise: yardwork & walking  Diet Pattern: low sugar  Salt Restriction: yes      Review of Systems  she describes decreased energy levels. The CBC and differential revealed no anemia. Her TSH was ideal at 1.02.    Objective:   Physical Exam  Gen.: Healthy and well-nourished in appearance. Alert, appropriate and cooperative throughout exam. Appears younger than stated age  Eyes: No corneal or conjunctival inflammation noted.  Extraocular motion intact. Vision grossly normal with lenses. Neck: No deformities, masses, or tenderness noted. Range of motion & Thyroid normal Lungs: Normal respiratory effort; chest expands symmetrically. Lungs are clear to auscultation without rales, wheezes, or increased work of breathing. Heart: Normal rate and rhythm. Normal S1 and S2. No gallop, click, or rub. S 4 w/o murmur. Abdomen: Bowel sounds normal; abdomen soft and nontender. No masses, organomegaly or hernias noted.                                                               Musculoskeletal/extremities:  No clubbing, cyanosis, edema noted. Range of motion  normal .Tone & strength  normal.Joints ; marked OA hand changes. Nail health  good. Vascular: Carotid,  radial artery, dorsalis pedis and  posterior tibial pulses are full and equal. No bruits present. Neurologic: Alert and oriented x3. Deep tendon reflexes symmetrical and normal. Gait is slightly broad and flat footed. Arm swing is slightly decreased.         Skin: Intact without suspicious lesions or rashes. Lymph: No cervical, axillary lymphadenopathy present. Psych: Mood and affect are normal. Normally interactive                                                                                         Assessment & Plan:

## 2012-01-06 NOTE — Assessment & Plan Note (Signed)
The labs do not reveal a cause for her fatigue. She does admit to some unsteadiness with her gait ,describing herself as "top heavy". Physical therapy evaluation will be recommended.

## 2012-01-06 NOTE — Assessment & Plan Note (Signed)
Blood pressure is well controlled. BUN is 20 and creatinine 1.0. GFR is minimally reduced at 54.79. No change indicated.

## 2012-01-06 NOTE — Assessment & Plan Note (Signed)
Her lipids are  good on diet alone; she is not on a statin. Triglycerides are mildly elevated at 154 and HDL is mildly reduced at 37.4. LDL is 109, down from 130. Nutritional intervention will be outlined.

## 2012-01-06 NOTE — Assessment & Plan Note (Addendum)
Her A1c is 6.1% down from 6.6% on diet alone. No change indicated. The A1c should be checked at least every 6 months. I question the reliability of her glucometer; it is several years old and will be replaced

## 2012-01-09 ENCOUNTER — Telehealth: Payer: Self-pay | Admitting: Internal Medicine

## 2012-01-09 MED ORDER — MAGIC MOUTHWASH: ORAL | Status: DC

## 2012-01-09 NOTE — ED Provider Notes (Signed)
Medical screening examination/treatment/procedure(s) were performed by resident physician or non-physician practitioner and as supervising physician I was immediately available for consultation/collaboration.   Demiah Gullickson DOUGLAS MD.    Marv Alfrey D Darrius Montano, MD 01/09/12 0927 

## 2012-01-09 NOTE — Telephone Encounter (Signed)
Refill sent to pharmacy.   

## 2012-01-09 NOTE — Telephone Encounter (Signed)
90 cc , 5 cc gargled well tid prn

## 2012-01-09 NOTE — Telephone Encounter (Signed)
There is no anemia and the MCV which assess the size of red cells is normal. With B12 deficiency one or both of these blood studies would be abnormal. I apologize for the confusion but I cannot order a B12 shot as there is no clinical indication for it

## 2012-01-09 NOTE — Telephone Encounter (Signed)
Refill: MMW/Maalox 1:1. Gargle and swallow 1 teaspoonful by mouth three times a day. Qty 90. Last fill 10-04-11

## 2012-01-09 NOTE — Telephone Encounter (Signed)
Faxed refill request received from pharmacy for MAGIC MOUTHWASH Last filled by MD on 08/05/11, pt uses for occasional sore throat Last seen on 01/06/12 Follow up IN 6 months for lab only. Please advise refills.

## 2012-01-09 NOTE — Telephone Encounter (Signed)
Patient called an stated that when she was in on 6.10.13 about getting a b12 injection and you advised her that she could get one if she wanted to pay for it. Patient is interested in getting one and she stated she would pay for it. Please advise if ok to schedule and I will call her back to schedule  Thanks Patient ph# [phone]

## 2012-01-10 NOTE — Telephone Encounter (Signed)
Can you call patient please as some of this appears to be clinical vs clerical Thanks

## 2012-01-13 NOTE — Telephone Encounter (Signed)
Discuss with patient  

## 2012-01-17 ENCOUNTER — Telehealth: Payer: Self-pay

## 2012-01-17 NOTE — Telephone Encounter (Signed)
I spoke with patient, appointment schedule to see Dr.Hopper on Monday for Arthritis

## 2012-01-17 NOTE — Telephone Encounter (Signed)
Dr.Hopper please advise on patient's request to be referred to rheumatology

## 2012-01-17 NOTE — Telephone Encounter (Signed)
Message copied by Maurice Small on Fri Jan 17, 2012  9:18 AM ------      Message from: Almeta Monas P      Created: Thu Jan 16, 2012 10:26 AM                   ----- Message -----         From: Thomasena Edis         Sent: 01/16/2012  10:16 AM           To: [facility]            Caller: Kiya/Patient; Phone Number: [phone]; Message from caller: Patient needs Dr Alwyn Ren to refer her to a rheumatology dr

## 2012-01-17 NOTE — Telephone Encounter (Signed)
When our referral coordinator attempts to make appointments with Specialists such as a Rheumatologist; she is informed  they require an updated, current  assessment and written note from the Primary Care physician  to review before they  schedule an appointment to assess symptoms or problems. If we do not have such  a current  assessment of your health issue or complaint in the chart (electronic medical record);you will need to  make an appointment to create this document THEY REQUIRE. It will be necessary to know prior evaluations and treatments of this symptom and response to these interventions. Please bring that medical history & all medications & supplements to that appointment so I can complete the required document.  I am sorry about this policy which is not ours. Feel free to contact directly any of the rheumatologist in the triad to see if they'll see you  without such documentation.

## 2012-01-20 ENCOUNTER — Ambulatory Visit: Payer: Medicare Other | Admitting: Internal Medicine

## 2012-02-05 ENCOUNTER — Inpatient Hospital Stay (HOSPITAL_COMMUNITY)
Admission: AD | Admit: 2012-02-05 | Discharge: 2012-02-07 | DRG: 395 | Disposition: A | Discharge status: Home / Self Care | Payer: Medicare Other | Source: Ambulatory Visit | Attending: Internal Medicine | Admitting: Internal Medicine

## 2012-02-05 ENCOUNTER — Encounter: Payer: Self-pay | Admitting: Physician Assistant

## 2012-02-05 ENCOUNTER — Ambulatory Visit (INDEPENDENT_AMBULATORY_CARE_PROVIDER_SITE_OTHER): Payer: Medicare Other | Admitting: Physician Assistant

## 2012-02-05 ENCOUNTER — Encounter (HOSPITAL_COMMUNITY): Payer: Self-pay | Admitting: *Deleted

## 2012-02-05 ENCOUNTER — Observation Stay (HOSPITAL_COMMUNITY): Payer: Medicare Other

## 2012-02-05 ENCOUNTER — Telehealth: Payer: Self-pay

## 2012-02-05 VITALS — BP 92/60 | HR 73 | Ht 61.0 in | Wt 143.0 lb

## 2012-02-05 DIAGNOSIS — J4489 Other specified chronic obstructive pulmonary disease: Secondary | ICD-10-CM | POA: Diagnosis present

## 2012-02-05 DIAGNOSIS — K625 Hemorrhage of anus and rectum: Secondary | ICD-10-CM

## 2012-02-05 DIAGNOSIS — R1031 Right lower quadrant pain: Secondary | ICD-10-CM

## 2012-02-05 DIAGNOSIS — F3289 Other specified depressive episodes: Secondary | ICD-10-CM | POA: Diagnosis present

## 2012-02-05 DIAGNOSIS — K559 Vascular disorder of intestine, unspecified: Secondary | ICD-10-CM

## 2012-02-05 DIAGNOSIS — E860 Dehydration: Secondary | ICD-10-CM | POA: Diagnosis present

## 2012-02-05 DIAGNOSIS — I959 Hypotension, unspecified: Secondary | ICD-10-CM | POA: Diagnosis present

## 2012-02-05 DIAGNOSIS — E119 Type 2 diabetes mellitus without complications: Secondary | ICD-10-CM | POA: Diagnosis present

## 2012-02-05 DIAGNOSIS — D72829 Elevated white blood cell count, unspecified: Secondary | ICD-10-CM | POA: Diagnosis present

## 2012-02-05 DIAGNOSIS — F329 Major depressive disorder, single episode, unspecified: Secondary | ICD-10-CM | POA: Diagnosis present

## 2012-02-05 DIAGNOSIS — R1032 Left lower quadrant pain: Secondary | ICD-10-CM

## 2012-02-05 DIAGNOSIS — K219 Gastro-esophageal reflux disease without esophagitis: Secondary | ICD-10-CM | POA: Diagnosis present

## 2012-02-05 DIAGNOSIS — E785 Hyperlipidemia, unspecified: Secondary | ICD-10-CM | POA: Diagnosis present

## 2012-02-05 DIAGNOSIS — I10 Essential (primary) hypertension: Secondary | ICD-10-CM | POA: Diagnosis present

## 2012-02-05 DIAGNOSIS — Z85528 Personal history of other malignant neoplasm of kidney: Secondary | ICD-10-CM

## 2012-02-05 DIAGNOSIS — K921 Melena: Secondary | ICD-10-CM | POA: Diagnosis present

## 2012-02-05 DIAGNOSIS — J449 Chronic obstructive pulmonary disease, unspecified: Secondary | ICD-10-CM | POA: Diagnosis present

## 2012-02-05 HISTORY — DX: Type 2 diabetes mellitus without complications: E11.9

## 2012-02-05 LAB — COMPREHENSIVE METABOLIC PANEL
AST: 16 U/L (ref 0–37)
Albumin: 3.4 g/dL — ABNORMAL LOW (ref 3.5–5.2)
Alkaline Phosphatase: 51 U/L (ref 39–117)
BUN: 22 mg/dL (ref 6–23)
CO2: 22 mEq/L (ref 19–32)
Chloride: 100 mEq/L (ref 96–112)
Potassium: 4 mEq/L (ref 3.5–5.1)
Total Bilirubin: 2.1 mg/dL — ABNORMAL HIGH (ref 0.3–1.2)

## 2012-02-05 LAB — CBC
HCT: 44.2 % (ref 36.0–46.0)
RBC: 5.17 MIL/uL — ABNORMAL HIGH (ref 3.87–5.11)
RDW: 13.5 % (ref 11.5–15.5)
WBC: 24.7 10*3/uL — ABNORMAL HIGH (ref 4.0–10.5)

## 2012-02-05 LAB — PROTIME-INR: Prothrombin Time: 14.2 seconds (ref 11.6–15.2)

## 2012-02-05 MED ORDER — ONDANSETRON HCL 4 MG PO TABS: 4.0000 mg | ORAL_TABLET | Freq: Four times a day (QID) | ORAL | Status: DC | PRN

## 2012-02-05 MED ORDER — IOHEXOL 300 MG/ML  SOLN
80.0000 mL | Freq: Once | INTRAMUSCULAR | Status: AC | PRN
  Administered 2012-02-05: 80 mL via INTRAVENOUS

## 2012-02-05 MED ORDER — ACETAMINOPHEN 650 MG RE SUPP: 650.0000 mg | Freq: Four times a day (QID) | RECTAL | Status: DC | PRN

## 2012-02-05 MED ORDER — ACETAMINOPHEN 325 MG PO TABS
650.0000 mg | ORAL_TABLET | Freq: Four times a day (QID) | ORAL | Status: DC | PRN
  Administered 2012-02-06: 650 mg via ORAL
  Filled 2012-02-05: qty 2

## 2012-02-05 MED ORDER — KCL IN DEXTROSE-NACL 10-5-0.45 MEQ/L-%-% IV SOLN
INTRAVENOUS | Status: DC
  Administered 2012-02-05 – 2012-02-07 (×4): via INTRAVENOUS
  Filled 2012-02-05 (×5): qty 1000

## 2012-02-05 MED ORDER — SALMETEROL XINAFOATE 50 MCG/DOSE IN AEPB
1.0000 | INHALATION_SPRAY | Freq: Two times a day (BID) | RESPIRATORY_TRACT | Status: DC
  Administered 2012-02-05 – 2012-02-07 (×4): 1 via RESPIRATORY_TRACT
  Filled 2012-02-05: qty 0

## 2012-02-05 MED ORDER — TRAMADOL HCL 50 MG PO TABS
50.0000 mg | ORAL_TABLET | Freq: Four times a day (QID) | ORAL | Status: DC | PRN
  Administered 2012-02-06 – 2012-02-07 (×4): 50 mg via ORAL
  Filled 2012-02-05 (×4): qty 1

## 2012-02-05 MED ORDER — ONDANSETRON HCL 4 MG/2ML IJ SOLN: 4.0000 mg | Freq: Four times a day (QID) | INTRAMUSCULAR | Status: DC | PRN

## 2012-02-05 MED ORDER — ALBUTEROL SULFATE (5 MG/ML) 0.5% IN NEBU: 2.5000 mg | INHALATION_SOLUTION | Freq: Four times a day (QID) | RESPIRATORY_TRACT | Status: DC | PRN

## 2012-02-05 MED ORDER — FLUTICASONE PROPIONATE HFA 44 MCG/ACT IN AERO
2.0000 | INHALATION_SPRAY | Freq: Two times a day (BID) | RESPIRATORY_TRACT | Status: DC | PRN
  Administered 2012-02-07: 2 via RESPIRATORY_TRACT
  Filled 2012-02-05 (×2): qty 10.6

## 2012-02-05 MED ORDER — HYDROMORPHONE HCL PF 1 MG/ML IJ SOLN: 0.5000 mg | INTRAMUSCULAR | Status: DC | PRN

## 2012-02-05 NOTE — Progress Notes (Signed)
Agree with Ms. Esterwood's assessment and plan. Taven Strite E. Jibri Schriefer, MD, FACG   

## 2012-02-05 NOTE — Telephone Encounter (Signed)
Dr. Nicholas Lose called

## 2012-02-05 NOTE — Telephone Encounter (Signed)
Dr. Nicholas Lose called for an urgent appt for this patient. She has history with Dr. Russella Dar from 06.  She is having lower abdominal cramping and rectal bleeding.  She is hemodynamically stable.  He will send a handwritten note and labs over.

## 2012-02-05 NOTE — Progress Notes (Signed)
Patient ID: Alexa [name], female   DOB: September 20, 1929, 78 y.o.   MRN: [id number]  Alexa [name] is a pleasant 78 year old female known remotely to Dr. Russella Dar from colonoscopy done in 2006 for followup of adenomatous polyps. She was found to have multiple small colon polyps and sigmoid diverticulosis. Path on the polyps all consistent with hyperplastic polyps. Patient was referred as an urgent workup in today per Dr. Nicholas Lose for acute onset of abdominal pain and rectal bleeding Exam and history are most consistent with a segmental ischemic colitis. At patient is mildly hypotensive and feels very weak. Fingerstick hemoglobin in Dr. Nicholas Lose is office today was 14.9. Patient is admitted on observation to [facility], on the GI service for supportive management and CT of the abdomen and pelvis. For details please see the admission H&P

## 2012-02-05 NOTE — H&P (Signed)
[facility]   Please also see the physician assistant note. This patient was admitted from the orifice. I interviewed, reviewed and participated in the history as well as examined this patient.  She has a clinical scenario compatible with ischemic colitis she, she also appears to be dehydrated. She is admitted for observation. Anticipate diagnostic evaluation with CT scanning depending upon laboratory assessment, and what that allows.   I appreciate the opportunity to care for this patient.   Iva Boop, MD, [facility] [phone] (pager) 02/05/2012 5:09 PM

## 2012-02-05 NOTE — H&P (Signed)
Primary Care Physician:  Marga Melnick, MD Primary Gastroenterologist:  Dr. Russella Dar  CHIEF COMPLAINT:  Acute lower abdominal pain and rectal bleeding with weakness  HPI: Alexa [name] is a 78 y.o. female known remotely to Dr. Russella Dar from colonoscopy done in 2006. That was done for followup of adenomatous polyps. She was found to have multiple small polyps and sigmoid colon diverticulosis as well as internal hemorrhoids. Path  was consistent with hyperplastic polyps. Patient comes in today as an urgent add-on or valuation of acute onset of lower abdominal pain and rectal bleeding. She saw Dr. Nicholas Lose earlier today and was referred here. Patient says she had been outside for a couple of hours yesterday in the sheet well someone was at her house fixing something and she said she began to feel poorly and when she finally came inside she had broken out in a sweat and then felt as lightheaded as if she may pass out. This was followed by nausea and bad crampy abdominal pain and then she proceeded to have 6 bowel movements initially nonbloody and then becoming dark and bloody. She says she was uncomfortable all night last night and didn't sleep due to her abdominal pain has felt weak and nauseated and has not eaten. She really has not been drinking only sips of ginger ale This morning she had 3 more episodes of small-volume squirts of dark bloody stool and clots. She went to see Dr. Nicholas Lose who evaluated her, did a fingerstick hemoglobin which was 14.9. And on rectal exam she was noted to have dark maroonish stool Patient has not had any further bleeding since but says her abdomen is still hurting all across her lower abdomen she continues to feel nauseated and quite weak. She did take her blood pressure medicine yesterday but has not which took any of her medications today. Blood pressure in our office today is 92/60 pulse in the 70s  Patient also mentions that she really has not felt very well over the past couple of  months and has had some generalized weakness which is unlike her.      Past Medical History  Diagnosis Date  . Type II or unspecified type diabetes mellitus without mention of complication, not stated as uncontrolled   . Chronic obstructive pulmonary disease   . Malignant neoplasm of kidney and other and unspecified urinary organs   . Personal history of venous thrombosis and embolism     PTE after nephrectomy  . Esophageal reflux   . Irritable bowel syndrome   . Unspecified essential hypertension   . Other and unspecified hyperlipidemia   . Diverticulosis of colon (without mention of hemorrhage)   . Spinal stenosis   . History of colonic polyps 06/05/05    hyperplastic  . Diverticulosis   . Internal hemorrhoids     Past Surgical History  Procedure Date  . Septoplasty   . Tonsillectomy   . Breast biopsy   . Colonoscopy w/ polypectomy 2000  . Colonoscopy w/ polypectomy 07/2004    Hyperplastic polyps  . Dilation and curettage of uterus 03/2005    Uterine Polyps  . Nephrectomy     right,Dr Dahlstedt    Prior to Admission medications   Medication Sig Start Date End Date Taking? Authorizing Provider  acetaminophen (TYLENOL) 650 MG CR tablet Take 650 mg by mouth every 8 (eight) hours as needed. For pain   Yes Historical Provider, MD  albuterol (PROVENTIL HFA;VENTOLIN HFA) 108 (90 BASE) MCG/ACT inhaler Inhale 2 puffs into  the lungs every 6 (six) hours as needed. For shortness of breath   Yes Historical Provider, MD  Alum & Mag Hydroxide-Simeth (MAGIC MOUTHWASH) SOLN Gargle and swallow 1 teaspoon by mouth three times a day as needed. 01/09/12  Yes Pecola Lawless, MD  FLOVENT HFA 44 MCG/ACT inhaler INHALE 1 TO 2 PUFFS BY MOUTH TWICE A DAY IF NEEDED 12/12/11  Yes Pecola Lawless, MD  glucose blood test strip Use as instructed. one touch blue test strips Dx:250.00 01/06/12 01/05/13 Yes Pecola Lawless, MD  Lancets [facility] ULTRASOFT) lancets As directed  Dx:250.00 01/06/12 01/05/13  Yes Pecola Lawless, MD  lisinopril (PRINIVIL,ZESTRIL) 40 MG tablet This replaces lisinopril/HCT is a 11/07/11  Yes Pecola Lawless, MD  ranitidine (ZANTAC) 150 MG tablet Take 75 mg by mouth every 12 (twelve) hours as needed.  06/29/11  Yes Pecola Lawless, MD  salmeterol (SEREVENT) 50 MCG/DOSE diskus inhaler Inhale 1 puff into the lungs 2 (two) times daily. 10/08/11  Yes Pecola Lawless, MD  traMADol Janean Sark) 50 MG tablet 1/2-1 every 8 hrs prn 01/06/12  Yes Pecola Lawless, MD    Current Outpatient Prescriptions  Medication Sig Dispense Refill  . DISCONTD: DULoxetine (CYMBALTA) 30 MG capsule Take 1 capsule (30 mg total) by mouth daily.  30 capsule  0    Allergies as of 02/05/2012 - Review Complete 02/05/2012  Allergen Reaction Noted  . Bupropion hcl  03/02/2007  . Gabapentin Anaphylaxis 10/14/2011  . Codeine  03/02/2007  . Cymbalta (duloxetine hcl)  11/07/2011  . Verapamil  01/06/2012    Family History  Problem Relation Age of Onset  . Stroke Mother     onset in 53s  . Diabetes Mother   . Stroke Brother   . Heart failure Brother   . Heart attack Other     maternal family history  . Heart failure Maternal Grandfather   . Lung cancer Father   . Heart failure Sister   . Alzheimer's disease Sister   . COPD Sister   . Cancer Mother     bladder; nephrectomy for calculi  . Cancer Mother     also uterine , TAH & BSO  . Aneurysm Mother     thoracic  . Aneurysm Brother     cns    History   Social History  . Marital Status: Married    Spouse Name: N/A    Number of Children: N/A  . Years of Education: N/A   Occupational History  . Not on file.   Social History Main Topics  . Smoking status: Never Smoker   . Smokeless tobacco: Never Used  . Alcohol Use: No  . Drug Use: No  . Sexually Active: Not on file   Other Topics Concern  . Not on file   Social History Narrative  . No narrative on file    Review of Systems: Pertinent positive and negative review of  systems were noted in the above HPI section.  All other review of systems was otherwise negative. Marland Kitchen  Physical Exam: Vital signs in last 24 hours: @VSRANGES @   General:   Alert,  Well-developed, well-nourished, pleasant and cooperative in NAD, weak and ambulating very slowly blood pressure 92/60 pulse in the 70s height 5 foot 1 weight 143 Head:  Normocephalic and atraumatic. Eyes:  Sclera clear, no icterus.   Conjunctiva pink. Ears:  Normal auditory acuity. Nose:  No deformity, discharge,  or lesions. Mouth:  No deformity or lesions.  Oropharynx pink & moist. Neck:  Supple; no masses or thyromegaly. Lungs:  Clear throughout to auscultation.   No wheezes, crackles, or rhonchi. No acute distress. Heart:  Regular rate and rhythm; no murmurs, clicks, rubs,  or gallops. Abdomen:  Soft, bowel sounds are present, she is tender bilaterally in the lower quadrants there is no guarding and no rebound no palpable mass or hepatosplenomegaly ,Rectal: Done earlier per Dr. Nicholas Lose today with dark maroonish stool noted    Msk:  Symmetrical without gross deformities. Normal posture. Pulses:  Normal pulses noted. Extremities:  Without clubbing or edema. Neurologic:  Alert and  oriented x4;  grossly normal neurologically.  Psych:  Alert and cooperative. Normal mood and affect.  Intake/Output from previous day:   Intake/Output this shift: @IOTHISSHIFT @      Impression / Plan:  #50 78 year old female with acute onset about 24 hours ago with diaphoresis nausea intense lower abdominal crampy pain followed by bloody stools. Her bleeding seems to be subsiding today she continues to have lower abdominal crampy pain and significant weakness . Her exam and history are most consistent with a segmental ischemic colitis possibly precipitated by relative dehydration in the setting of heat and use of blood pressure medications. #2 mild hypotension secondary to above  #3 left renal cell  cancer status post left  nephrectomy approximately 6 years ago #4 diverticulosis history of colon polyps   #5 history of hypertension   #6 diet-controlled diabetes #7 hyperlipidemia #8 COPD  Plan; patient is admitted to the GI service, for baseline labs, supportive management with IV fluid hydration, bowel rest, pain control.  Will hold her blood pressure medication/Lisinopril We'll check CT scan of the abdomen and pelvis for diagnostic purposes For further details please see the orders      @RRHLOS @  Josefa Syracuse  02/05/2012, 4:20 PM

## 2012-02-06 DIAGNOSIS — E860 Dehydration: Secondary | ICD-10-CM | POA: Diagnosis present

## 2012-02-06 DIAGNOSIS — K921 Melena: Secondary | ICD-10-CM | POA: Diagnosis present

## 2012-02-06 DIAGNOSIS — I959 Hypotension, unspecified: Secondary | ICD-10-CM

## 2012-02-06 DIAGNOSIS — R1031 Right lower quadrant pain: Secondary | ICD-10-CM

## 2012-02-06 DIAGNOSIS — K559 Vascular disorder of intestine, unspecified: Principal | ICD-10-CM

## 2012-02-06 DIAGNOSIS — R1032 Left lower quadrant pain: Secondary | ICD-10-CM | POA: Diagnosis present

## 2012-02-06 LAB — CBC
Hemoglobin: 14.8 g/dL (ref 12.0–15.0)
MCH: 29.7 pg (ref 26.0–34.0)
MCV: 87.1 fL (ref 78.0–100.0)
RBC: 4.98 MIL/uL (ref 3.87–5.11)

## 2012-02-06 MED ORDER — ENSURE COMPLETE PO LIQD: 237.0000 mL | Freq: Two times a day (BID) | ORAL | Status: DC

## 2012-02-06 NOTE — Progress Notes (Signed)
I have taken an interval history, reviewed the chart and examined the patient. I agree with the extender's note, impression and recommendations. Her acute ischemic colitis is improving. Mild hypotension has resolved.  Alexa [name]. Russella Dar MD Clementeen Graham

## 2012-02-06 NOTE — Progress Notes (Signed)
Patient ID: Alexa [name], female   DOB: 1930/02/05, 78 y.o.   MRN: [id number] [facility] Progress Note  Subjective: Feels better but still quite weak- no further bleeding since last pm.   Ct abdomen/pelvis-marked left colon wall thickening, and pericolonic edema consistent with ischemic colitis.  Objective:  Vital signs in last 24 hours: Temp:  [97.5 F (36.4 C)-98.8 F (37.1 C)] 97.5 F (36.4 C) (07/11 0536) Pulse Rate:  [73-100] 95  (07/11 0536) Resp:  [18-19] 18  (07/11 0536) BP: (92-129)/(60-77) 127/77 mmHg (07/11 0536) SpO2:  [92 %-98 %] 92 % (07/11 0800) Weight:  [143 lb (64.864 kg)] 143 lb (64.864 kg) (07/10 1438) Last BM Date: 02/06/12 General:   Alert,  Well-developed,    in NAD Heart:  Regular rate and rhythm; no murmurs Pulm;clear Abdomen:  Soft, tender bilateral  Lower quadrants and nondistended. Normal bowel sounds, without guarding, and without rebound.   Extremities:  Without edema. Neurologic:  Alert and  oriented x4;  grossly normal neurologically. Psych:  Alert and cooperative. Normal mood and affect.  Intake/Output from previous day: 07/10 0701 - 07/11 0700 In: 1600.7 [P.O.:360; I.V.:1240.7] Out: -  Intake/Output this shift:    Lab Results:  Basename 02/06/12 0348 02/05/12 1700  WBC 24.5* 24.7*  HGB 14.8 15.3*  HCT 43.4 44.2  PLT 255 259   BMET  Basename 02/05/12 1700  NA 133*  K 4.0  CL 100  CO2 22  GLUCOSE 143*  BUN 22  CREATININE 1.16*  CALCIUM 8.8   LFT  Basename 02/05/12 1700  PROT 6.5  ALBUMIN 3.4*  AST 16  ALT 21  ALKPHOS 51  BILITOT 2.1*  BILIDIR --  IBILI --   PT/INR  Basename 02/05/12 1700  LABPROT 14.2  INR 1.08      Assessment / Plan: #1  78 yo female with acute ischemic colitis-left colon -marked leukocytosis-she is stable and clinically feeling better #2 hypotension secondary to above- resolved #3 hx renal cell ca  #4 copd  Continue hydration Hold antihypertensive Advance to full liquid  diet Cbc in am Possible home tomorrow Active Problems:  Hematochezia  LLQ pain  Dehydration     LOS: 1 day   Keeleigh Terris  02/06/2012, 9:16 AM

## 2012-02-06 NOTE — Progress Notes (Signed)
INITIAL ADULT NUTRITION ASSESSMENT Date: 02/06/2012   Time: 4:19 PM Reason for Assessment: Nutrition risk   INTERVENTION: Diet advancement per MD. Briefly discussed low fiber diet for colitis and diarrhea. Ensure Complete BID. Will monitor intake.   ASSESSMENT: Female 78 y.o.  Dx: Acute lower abdominal pain and rectal bleeding with weakness  Food/Nutrition Related Hx: Pt reports eating 2-3 meals/day PTA. Pt denies any problems chewing/swallowing, however states very rarely she will get choked on food when she gets nervous. Pt reports she is still having some abdominal pain and nausea. Pt reports increased weakness for the past year and half. During conversation pt was grimacing in pain r/t stomach cramping. Pt stated she had recently gotten pain medication for this. Pt with only one bowel movement today, with some blood per pt. Pt interested in getting Ensure and wants to start drinking it at home to build up her strength.   Hx:  Past Medical History  Diagnosis Date  . Chronic obstructive pulmonary disease   . Malignant neoplasm of kidney and other and unspecified urinary organs   . Personal history of venous thrombosis and embolism     PTE after nephrectomy  . Esophageal reflux   . Irritable bowel syndrome   . Unspecified essential hypertension   . Other and unspecified hyperlipidemia   . Diverticulosis of colon (without mention of hemorrhage)   . Spinal stenosis   . History of colonic polyps 06/05/05    hyperplastic  . Diverticulosis   . Internal hemorrhoids   . Type II or unspecified type diabetes mellitus without mention of complication, not stated as uncontrolled 02/05/2012    pt states it was in 2007   Related Meds:  Scheduled Meds:   . salmeterol  1 puff Inhalation BID   Continuous Infusions:   . dextrose 5 % and 0.45 % NaCl with KCl 10 mEq/L 100 mL/hr at 02/06/12 0629   PRN Meds:.acetaminophen, acetaminophen, albuterol, fluticasone, HYDROmorphone (DILAUDID) injection,  iohexol, ondansetron (ZOFRAN) IV, ondansetron, traMADol  Ht: 5\' 1"  (154.9 cm)  Wt: 143 lb (64.864 kg)  Ideal Wt: 105 lb % Ideal Wt: 136  Usual Wt: 145 lb % Usual Wt: 98  Body mass index is 27.02 kg/(m^2).   Labs:  CMP     Component Value Date/Time   NA 133* 02/05/2012 1700   K 4.0 02/05/2012 1700   CL 100 02/05/2012 1700   CO2 22 02/05/2012 1700   GLUCOSE 143* 02/05/2012 1700   BUN 22 02/05/2012 1700   CREATININE 1.16* 02/05/2012 1700   CALCIUM 8.8 02/05/2012 1700   PROT 6.5 02/05/2012 1700   ALBUMIN 3.4* 02/05/2012 1700   AST 16 02/05/2012 1700   ALT 21 02/05/2012 1700   ALKPHOS 51 02/05/2012 1700   BILITOT 2.1* 02/05/2012 1700   GFRNONAA 43* 02/05/2012 1700   GFRAA 50* 02/05/2012 1700    Intake/Output Summary (Last 24 hours) at 02/06/12 1634 Last data filed at 02/06/12 1408  Gross per 24 hour  Intake   2454 ml  Output      0 ml  Net   2454 ml    Last BM - 02/06/12 with blood in stool per pt report   Diet Order: Full Liquid  IVF:    dextrose 5 % and 0.45 % NaCl with KCl 10 mEq/L Last Rate: 100 mL/hr at 02/06/12 0629    Estimated Nutritional Needs:   Kcal:1400-1650 Protein:65-80g Fluid:1.4-1.6L  NUTRITION DIAGNOSIS: -Inadequate oral intake (NI-2.1).  Status: Ongoing  RELATED TO: abdominal pain,  nausea  AS EVIDENCE BY: 25% meal intake  MONITORING/EVALUATION(Goals): Advance diet as tolerated to low fiber diet.   EDUCATION NEEDS: -Education needs addressed - discussed low fiber diet for colitis and diarrhea  Dietitian #: [phone]  DOCUMENTATION CODES Per approved criteria  -Not Applicable    Marshall Cork 02/06/2012, 4:19 PM

## 2012-02-06 NOTE — Progress Notes (Signed)
UR complete 

## 2012-02-07 LAB — CBC WITH DIFFERENTIAL/PLATELET
Basophils Absolute: 0.1 10*3/uL (ref 0.0–0.1)
Eosinophils Absolute: 0.5 10*3/uL (ref 0.0–0.7)
Eosinophils Relative: 2 % (ref 0–5)
Lymphs Abs: 2.6 10*3/uL (ref 0.7–4.0)
MCH: 29 pg (ref 26.0–34.0)
MCV: 86.5 fL (ref 78.0–100.0)
Platelets: 219 10*3/uL (ref 150–400)
RDW: 13.6 % (ref 11.5–15.5)

## 2012-02-07 LAB — BASIC METABOLIC PANEL
CO2: 24 mEq/L (ref 19–32)
Calcium: 8.2 mg/dL — ABNORMAL LOW (ref 8.4–10.5)
Creatinine, Ser: 1.08 mg/dL (ref 0.50–1.10)
Glucose, Bld: 164 mg/dL — ABNORMAL HIGH (ref 70–99)

## 2012-02-07 MED ORDER — LISINOPRIL 40 MG PO TABS: 20.0000 mg | ORAL_TABLET | Freq: Every morning | ORAL | Status: DC

## 2012-02-07 NOTE — Discharge Summary (Signed)
I have taken an interval history, reviewed the chart and examined the patient. I agree with the extender's note, impression and recommendations.   Kinsly Hild T. Johneisha Broaden MD FACG 

## 2012-02-07 NOTE — Progress Notes (Signed)
I have taken an interval history, reviewed the chart and examined the patient. I agree with the extender's note, impression and recommendations.   Genever Hentges T. Laiden Milles MD FACG 

## 2012-02-07 NOTE — Progress Notes (Signed)
Patient ID: Alexa [name], female   DOB: 1930/02/27, 78 y.o.   MRN: [id number] [facility] Progress Note  Subjective: Doing well, has been tolerating full liquids, still some pain but not taking pain meds. No bleeding. She feels she can manage at home  Objective:  Vital signs in last 24 hours: Temp:  [98.6 F (37 C)-100.9 F (38.3 C)] 98.6 F (37 C) (07/12 0636) Pulse Rate:  [77-96] 77  (07/12 0636) Resp:  [16-18] 16  (07/12 0636) BP: (105-132)/(57-65) 119/65 mmHg (07/12 0636) SpO2:  [92 %-94 %] 92 % (07/12 0749) Last BM Date: 02/06/12 General:   Alert,  Well-developed,    in NAD Heart:  Regular rate and rhythm; no murmurs Pulm;clear Abdomen:  Soft, tenderLLQ  and nondistended. Normal bowel sounds, without guarding, and without rebound.   Extremities:  Without edema. Neurologic:  Alert and  oriented x4;  grossly normal neurologically. Psych:  Alert and cooperative. Normal mood and affect.  Intake/Output from previous day: 07/11 0701 - 07/12 0700 In: 2590 [P.O.:240; I.V.:2350] Out: 800 [Urine:800] Intake/Output this shift:    Lab Results:  Basename 02/07/12 0410 02/06/12 0348 02/05/12 1700  WBC 21.7* 24.5* 24.7*  HGB 13.1 14.8 15.3*  HCT 39.0 43.4 44.2  PLT 219 255 259   BMET  Basename 02/07/12 0410 02/05/12 1700  NA 136 133*  K 3.9 4.0  CL 104 100  CO2 24 22  GLUCOSE 164* 143*  BUN 12 22  CREATININE 1.08 1.16*  CALCIUM 8.2* 8.8   LFT  Basename 02/05/12 1700  PROT 6.5  ALBUMIN 3.4*  AST 16  ALT 21  ALKPHOS 51  BILITOT 2.1*  BILIDIR --  IBILI --   PT/INR  Basename 02/05/12 1700  LABPROT 14.2  INR 1.08    Assessment / Plan: #1  78 yo female with acute segmental ischemic colitis-improving, and stable for discharge today. Will restart her antihypertensive at 1/2 dose. We discussed follow up next week with Dr. Alwyn Ren to repeat cbc and recheck BP etc.  Soft diet Active Problems:  Hematochezia  LLQ pain  Dehydration     LOS: 2 days    Hiya Point  02/07/2012, 9:12 AM

## 2012-02-07 NOTE — Progress Notes (Signed)
Pleasant 78 y.o. Female states ahe is ready to go home. Denies any pain or discomfort. Appetite good. OOB on her own using the [facility]. IV dc'd Discharge instructions explained. Pt has a good understanding of how to take her medicines. She was discharged to her husband

## 2012-02-07 NOTE — Discharge Summary (Signed)
[facility] Discharge Summary  Name: Alexa [name] MRN: [id number] DOB: 29-Jul-1930 78 y.o. PCP:  Marga Melnick, MD  Date of Admission: 02/05/2012  4:04 PM Date of Discharge: 02/07/2012 Attending Physician: Dr Russella Dar Discharge Diagnosis: #77 78 year old female with acute left-sided segmental ischemic colitis, stable and improving possibly precipitated by dehydration in the setting of antihypertensive use #2 leukocytosis secondary to above improving #3 other diagnoses as listed in the H&P Active Problems:  Hematochezia  LLQ pain  Dehydration   Consultations: none Procedures Performed:  Ct Abdomen Pelvis W Contrast  02/05/2012  *RADIOLOGY REPORT*  Clinical Data: Abdominal pain  CT ABDOMEN AND PELVIS WITH CONTRAST  Technique:  Multidetector CT imaging of the abdomen and pelvis was performed following the standard protocol during bolus administration of intravenous contrast.  Contrast: 80mL OMNIPAQUE IOHEXOL 300 MG/ML  SOLN  Comparison: 11/09/2006  Findings: Mild irregular basilar peripheral lung markings.  Stable.  Diffuse hepatic steatosis.  Gallbladder, spleen, pancreas, adrenal glands, right kidney are within normal limits.  Left nephrectomy.  There is severe wall thickening of the colon involving the distal transverse colon, splenic flexure, and entire length of the descending colon.  There is no pneumatosis or extraluminal bowel gas.  There is fluid density within the fat adjacent to the descending colon as well as inflammatory changes within the adjacent fat.  No loculated abscess.  The aorta is nonaneurysmal and patent.  The SMA and IMA are grossly widely patent.  Trace free fluid in the pelvis.  Bladder, uterus, and adnexa are within normal limits.  Degenerative changes of the lumbar spine.  IMPRESSION: There is marked wall thickening of the colon as described associated with inflammatory changes in the adjacent fat.  This involves the splenic flexure and descending colon.  The  patient had similar findings and 2008.  Differential diagnosis includes inflammatory disease such as infection or inflammatory bowel disease.  Also consider ischemic colitis as this is a watershed zone between the SMA and IMA.  Original Report Authenticated By: Donavan Burnet, M.D.    GI Procedures: none  History/Physical Exam:  See Admission H&P  Admission HPI: Alexa [name] is a very nice 78 year old white female who was admitted from the office on 02/05/2012 with a suspected episode of segmental ischemic colitis which was associated with significant weakness and mild hypotension. She had had onset the day before with severe abdominal pain nausea diaphoresis followed by multiple bowel movements, diarrhea and rectal bleeding. Earlier in the day she had been outside in the heat for a couple of hours on iron when she came inside this episode began. She had a presyncopal feeling but did not pass out. Arlyce Dice seen in the office her blood pressure was 90/62 and she had significant abdominal pain and was admitted for supportive management and CT of the abdomen and pelvis.  Hospital Course by problem list: Patient was admitted on observation status with suspected segmental ischemic colitis. She was placed on a clear liquid diet rehydration, her blood pressure medication was held. Her initial WBC was 24,000. She underwent CT scan of the abdomen and pelvis on the evening of admission and this did show a marked left sided colitis which was consistent with ischemia. She had no further active bleeding after admission, because of her marked leukocytosis she was kept in the hospital other 24 hours and observed. At this point she feels much improved has been tolerating by mouth's and feels that she can manage at home. Her WBC is down to 21,000 and  her electrolytes are normal. She wishes to followup with Dr. Alwyn Ren her primary care doctor next week, she should have a repeat CBC. We will resume her lisinopril but at half her  previous dose. She was on 40 mg and this will be decreased to 20 mg by mouth daily. She will follow up with GI as needed .    Active Problems:  Hematochezia  LLQ pain  Dehydration    Discharge Vitals:  BP 119/65  Pulse 77  Temp 98.6 F (37 C) (Oral)  Resp 16  Ht 5\' 1"  (1.549 m)  Wt 143 lb (64.864 kg)  BMI 27.02 kg/m2  SpO2 92%  Discharge Labs:  Results for orders placed during the hospital encounter of 02/05/12 (from the past 24 hour(s))  BASIC METABOLIC PANEL     Status: Abnormal   Collection Time   02/07/12  4:10 AM      Component Value Range   Sodium 136  135 - 145 mEq/L   Potassium 3.9  3.5 - 5.1 mEq/L   Chloride 104  96 - 112 mEq/L   CO2 24  19 - 32 mEq/L   Glucose, Bld 164 (*) 70 - 99 mg/dL   BUN 12  6 - 23 mg/dL   Creatinine, Ser 2.72  0.50 - 1.10 mg/dL   Calcium 8.2 (*) 8.4 - 10.5 mg/dL   GFR calc non Af Amer 47 (*) >90 mL/min   GFR calc Af Amer 55 (*) >90 mL/min  CBC WITH DIFFERENTIAL     Status: Abnormal   Collection Time   02/07/12  4:10 AM      Component Value Range   WBC 21.7 (*) 4.0 - 10.5 K/uL   RBC 4.51  3.87 - 5.11 MIL/uL   Hemoglobin 13.1  12.0 - 15.0 g/dL   HCT 53.6  64.4 - 03.4 %   MCV 86.5  78.0 - 100.0 fL   MCH 29.0  26.0 - 34.0 pg   MCHC 33.6  30.0 - 36.0 g/dL   RDW 74.2  59.5 - 63.8 %   Platelets 219  150 - 400 K/uL   Neutrophils Relative 78 (*) 43 - 77 %   Neutro Abs 16.9 (*) 1.7 - 7.7 K/uL   Lymphocytes Relative 12  12 - 46 %   Lymphs Abs 2.6  0.7 - 4.0 K/uL   Monocytes Relative 8  3 - 12 %   Monocytes Absolute 1.6 (*) 0.1 - 1.0 K/uL   Eosinophils Relative 2  0 - 5 %   Eosinophils Absolute 0.5  0.0 - 0.7 K/uL   Basophils Relative 0  0 - 1 %   Basophils Absolute 0.1  0.0 - 0.1 K/uL    Disposition and follow-up:   Alexa [name] was discharged from [facility] in stable condition.    Follow-up Appointments: Patient will make an appointment to see Dr. Alwyn Ren next week   Discharge Medications: Medication List  As of  02/07/2012  9:54 AM   TAKE these medications         acetaminophen 650 MG CR tablet   Commonly known as: TYLENOL   Take 650 mg by mouth every 8 (eight) hours as needed. For pain      albuterol 108 (90 BASE) MCG/ACT inhaler   Commonly known as: PROVENTIL HFA;VENTOLIN HFA   Inhale 2 puffs into the lungs every 6 (six) hours as needed. For shortness of breath      FLOVENT HFA 44 MCG/ACT inhaler  Generic drug: fluticasone      lisinopril 40 MG tablet   Commonly known as: PRINIVIL,ZESTRIL   Take 0.5 tablets (20 mg total) by mouth every morning. This replaces lisinopril/HCT is a      magic mouthwash Soln   Take 5 mLs by mouth 2 (two) times daily as needed. Sore throat.Marland KitchenMarland KitchenMarland KitchenGargle and swallow 1 teaspoon by mouth three times a day as needed.      onetouch ultrasoft lancets   As directed    Dx:250.00      ranitidine 150 MG tablet   Commonly known as: ZANTAC   Take 75 mg by mouth every 12 (twelve) hours as needed. Heartburn...ulcer      salmeterol 50 MCG/DOSE diskus inhaler   Commonly known as: SEREVENT   Inhale 1 puff into the lungs 2 (two) times daily.      traMADol 50 MG tablet   Commonly known as: ULTRAM   Take 50 mg by mouth every 8 (eight) hours as needed. Pain.1/2-1 every 8 hrs prn            Signed: Mike Gip 02/07/2012, 9:54 AM

## 2012-02-08 ENCOUNTER — Telehealth: Payer: Self-pay | Admitting: Gastroenterology

## 2012-02-08 NOTE — Telephone Encounter (Signed)
On call note @1315 . Pt DC'd from [facility] yesterday for ischemic colitis. She used a suppository today and note a small amount of old, dried blood. She generally takes a suppository every day and is concerned about getting constipated. Advised that she might see small amounts of old blood and that she likely does not have much stool in her colon at this time. Advised no laxatives or suppositories until Tues or Wed. She can try MOM or Miralax PO instead of supp.

## 2012-02-10 ENCOUNTER — Telehealth: Payer: Self-pay | Admitting: Internal Medicine

## 2012-02-10 DIAGNOSIS — D72829 Elevated white blood cell count, unspecified: Secondary | ICD-10-CM

## 2012-02-10 NOTE — Telephone Encounter (Signed)
Patient was instructed to get a CBC this week.  I have placed the order she will come on Friday

## 2012-02-14 ENCOUNTER — Other Ambulatory Visit (INDEPENDENT_AMBULATORY_CARE_PROVIDER_SITE_OTHER): Payer: Medicare Other

## 2012-02-14 DIAGNOSIS — D72829 Elevated white blood cell count, unspecified: Secondary | ICD-10-CM

## 2012-02-14 LAB — CBC WITH DIFFERENTIAL/PLATELET
Basophils Relative: 0.5 % (ref 0.0–3.0)
Eosinophils Absolute: 0.6 10*3/uL (ref 0.0–0.7)
Lymphocytes Relative: 23.4 % (ref 12.0–46.0)
MCHC: 33.2 g/dL (ref 30.0–36.0)
Monocytes Relative: 8.5 % (ref 3.0–12.0)
Neutrophils Relative %: 60.7 % (ref 43.0–77.0)
RBC: 4.89 Mil/uL (ref 3.87–5.11)
WBC: 8.2 10*3/uL (ref 4.5–10.5)

## 2012-02-27 NOTE — Addendum Note (Signed)
Addended by: Edwena Felty T on: 02/27/2012 04:08 PM   Modules accepted: Orders

## 2012-02-28 ENCOUNTER — Ambulatory Visit: Payer: Medicare Other | Admitting: Physical Therapy

## 2012-03-06 ENCOUNTER — Ambulatory Visit: Payer: Medicare Other | Attending: Internal Medicine | Admitting: Physical Therapy

## 2012-03-06 DIAGNOSIS — IMO0001 Reserved for inherently not codable concepts without codable children: Secondary | ICD-10-CM | POA: Insufficient documentation

## 2012-03-06 DIAGNOSIS — R269 Unspecified abnormalities of gait and mobility: Secondary | ICD-10-CM | POA: Insufficient documentation

## 2012-03-13 ENCOUNTER — Other Ambulatory Visit: Payer: Self-pay | Admitting: Internal Medicine

## 2012-03-16 ENCOUNTER — Encounter: Payer: Self-pay | Admitting: Family Medicine

## 2012-03-16 ENCOUNTER — Ambulatory Visit (INDEPENDENT_AMBULATORY_CARE_PROVIDER_SITE_OTHER): Payer: Medicare Other | Admitting: Family Medicine

## 2012-03-16 VITALS — BP 136/74 | HR 83 | Temp 98.1°F | Wt 142.0 lb

## 2012-03-16 DIAGNOSIS — N390 Urinary tract infection, site not specified: Secondary | ICD-10-CM

## 2012-03-16 DIAGNOSIS — C649 Malignant neoplasm of unspecified kidney, except renal pelvis: Secondary | ICD-10-CM

## 2012-03-16 NOTE — Assessment & Plan Note (Signed)
Pt very adamant today that she see nephrology due to solitary kidney and desire to avoid HD.  Pt's last Cr normal.  I am not PCP but pt clearly wants referral.  Will refer and Dr Alwyn Ren will f/u prn.

## 2012-03-16 NOTE — Patient Instructions (Addendum)
We'll call you with your kidney appt Try and hang in there! Call with any questions or concerns

## 2012-03-16 NOTE — Progress Notes (Signed)
  Subjective:    Patient ID: Alexa [name], female    DOB: 1930-01-21, 78 y.o.   MRN: [id number]  HPI Frequent UTIs- has Urologist [facility]) but wants to see [facility] due to bladder infections.  Currently has UTI and has been taking abx x1 week.  'i don't want to go on dialysi since I only have 1 kidney'.  Cr of 1.08 7/13.  Was initially started on Cipro and now on Macrobid, 'it's not working real well'.  Has called urology this AM w/out response.  Wants to see Dr Eliott Nine.   Review of Systems For ROS see HPI     Objective:   Physical Exam  Vitals reviewed. Constitutional: She appears well-developed and well-nourished. No distress.  Psychiatric: She has a normal mood and affect. Her behavior is normal. Thought content normal.          Assessment & Plan:

## 2012-03-16 NOTE — Assessment & Plan Note (Signed)
New to provider.  Pt reports this is ongoing issue and is currently on abx for this.  Has Urologist but is unhappy and would like to see Renal for surveillance and tx of solitary kidney.  Discussed that renal typically doesn't address recurrent lower tract infxns but pt is adamant about referral.  Referral made.

## 2012-04-02 ENCOUNTER — Ambulatory Visit: Payer: Medicare Other | Admitting: Physical Therapy

## 2012-04-06 ENCOUNTER — Ambulatory Visit (INDEPENDENT_AMBULATORY_CARE_PROVIDER_SITE_OTHER): Payer: Medicare Other | Admitting: Internal Medicine

## 2012-04-06 ENCOUNTER — Encounter: Payer: Self-pay | Admitting: Internal Medicine

## 2012-04-06 VITALS — BP 138/80 | HR 96 | Temp 97.6°F | Wt 136.8 lb

## 2012-04-06 DIAGNOSIS — Z87440 Personal history of urinary (tract) infections: Secondary | ICD-10-CM

## 2012-04-06 DIAGNOSIS — R799 Abnormal finding of blood chemistry, unspecified: Secondary | ICD-10-CM

## 2012-04-06 DIAGNOSIS — C649 Malignant neoplasm of unspecified kidney, except renal pelvis: Secondary | ICD-10-CM

## 2012-04-06 DIAGNOSIS — R7989 Other specified abnormal findings of blood chemistry: Secondary | ICD-10-CM

## 2012-04-06 NOTE — Patient Instructions (Addendum)
BUN, creatinine, and GFR  all assess kidney function. To protect the kidneys it  is important to control your blood pressure and sugar. You should also stay well hydrated. Drink to thirst, up to 32 ounces of fluids per day.   Review and correct the record as indicated. Please share record with all medical staff seen.

## 2012-04-06 NOTE — Progress Notes (Signed)
  Subjective:    Patient ID: Alexa [name], female    DOB: 05-06-1930, 78 y.o.   MRN: [id number]  HPI She is requesting referral to Nephrology with specific questions about diet to protect her remaining kidney. 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  She is followed by Dr. Retta Diones, Urologist. She states that she's had recurrent kidney infections, "6-7 times". His last note last month was reviewed. She's had both Escherichia coli and enterococcal infections recently. He placed her on Cipro at that time for symptoms of possible urinary tract infection. I do not have the results of that culture.  In the last 4 months her creatinine has ranged from 1.0-1.16; BUN 12-20; and GFR 54.79-43. She is on lisinopril 40 mg daily   Review of Systems She was evaluated by physical therapy for gait dysfunction; she was unable to complete the course of therapy due to financial reasons but apparently a "cuff" will be prescribed for the right lower extremity to prevent extroversion.     Objective:   Physical Exam General appearance is one of good health and nourishment w/o distress.Appears younger than stated age   Eyes: No conjunctival inflammation or scleral icterus is present.     Heart:  Normal rate and regular rhythm. S1 and S2 normal without gallop, murmur, click, rub . Occasional premature  Lungs:Chest clear to auscultation; no wheezes, rhonchi,rales ,or rubs present.No increased work of breathing.   Abdomen: bowel sounds normal, soft and non-tender without masses, organomegaly or hernias noted.  No guarding or rebound   Skin:Warm & dry.  Intact without suspicious lesions or rashes ; no jaundice . Slight tenting  Lymphatic: No lymphadenopathy is noted about the head, neck, axilla areas.              Assessment & Plan:  #1 recurrent urinary tract infections; this should be assessed by Dr. Nicholas Lose, her Gynecologist and Dr. Retta Diones, Urologist to see if there is any  mechanical or anatomical predisposition.  She does not have significant renal impairment; she should stay adequately hydrated.

## 2012-04-06 NOTE — Addendum Note (Signed)
Addended byMarga Melnick F on: 04/06/2012 01:08 PM   Modules accepted: Orders

## 2012-04-14 ENCOUNTER — Other Ambulatory Visit: Payer: Self-pay | Admitting: Internal Medicine

## 2012-04-16 ENCOUNTER — Encounter: Payer: Self-pay | Admitting: Gastroenterology

## 2012-04-20 ENCOUNTER — Encounter: Payer: Medicare Other | Attending: Internal Medicine | Admitting: *Deleted

## 2012-04-20 ENCOUNTER — Encounter: Payer: Self-pay | Admitting: *Deleted

## 2012-04-20 VITALS — Ht 62.5 in | Wt 137.5 lb

## 2012-04-20 DIAGNOSIS — R799 Abnormal finding of blood chemistry, unspecified: Secondary | ICD-10-CM

## 2012-04-20 DIAGNOSIS — N189 Chronic kidney disease, unspecified: Secondary | ICD-10-CM | POA: Insufficient documentation

## 2012-04-20 DIAGNOSIS — Z713 Dietary counseling and surveillance: Secondary | ICD-10-CM | POA: Insufficient documentation

## 2012-04-20 NOTE — Patient Instructions (Signed)
Goals: 1. Limit sodium to <2000 mg daily by eating at home more and reading food labels.  2. Consume 50-60 g of high-quality protein (meat, eggs) daily.  3. Drink up to 32 ounces of fluid daily per MD recommendations.  4. Monitor intake of high potassium and phosphorus foods. Restriction is not necessary at this time, however, due to lab values and current kidney function.

## 2012-04-20 NOTE — Progress Notes (Signed)
Medical Nutrition Therapy:  Appt start time: 0830 end time:  0930.  Assessment:  Primary concerns today: Elevated creatinine. Patient had a nephrectomy in 2007 for renal malignancy. She has had frequent kidney infections and periodic elevated creatinine. She is concerned about her diet to protect her remaining kidney. She c/o having little energy to cook and decreased appetite. Creatinine 01/2012 1.08. She has type 2 diabetes controlled by diet alone. HgbA1c 11/2011 6.1%. Hypertension controlled on medications, usual blood pressure 135/75.   WEIGHT: 137.5 pounds BMI: 24.8   MEDICATIONS: Lisinopril   DIETARY INTAKE:   Usual eating pattern includes 3 meals and 1-2 snacks per day.  24-hr recall:  B (10 AM): Gravy with biscuit, scrambled eggs/boiled, or pancakes, coffee or apple/cranberry juice Snk ( AM): None  L ( PM): Leftover, Steak and Shake, hamburger, fries, Chick fila Snk ( PM): None D ( PM): Fish, crepes, broccoli, beans, sweet potatoes/corn Snk ( PM): Cookie, graham crackers Beverages: Juice, water, occasional soda  Usual physical activity: Walking with grandchildren.   Estimated energy needs: 1400 calories 193 g carbohydrates 53 g protein 37 g fat  Progress Towards Goal(s):  In progress.   Nutritional Diagnosis:  NB-1.1 Food and nutrition-related knowledge deficit As related to elevated creatinine.  As evidenced by no prior education.    Intervention:  Nutrition counseling. Patient educated on the importance of controlling blood glucose and hypertension to preserve function of her remaining kidney. She was advised to eat modest amounts of high-quality protein, reduce sodium intake, and maintain adequate hydration.   Goals: 1. Limit sodium to <2000 mg daily by eating at home more and reading food labels.  2. Consume 50-60 g of high-quality protein (meat, eggs) daily.  3. Drink up to 32 ounces of fluid daily per MD recommendations.  4. Monitor intake of high potassium and  phosphorus foods. Restriction is not necessary at this time, however, due to lab values and current kidney function.   Handouts given during visit include:  Renal Nutrition Therapy  Low-sodium diet handout  Monitoring/Evaluation:  Dietary intake, exercise, renal labs, and body weight prn.

## 2012-04-23 ENCOUNTER — Ambulatory Visit: Payer: Medicare Other | Admitting: Dietician

## 2012-06-08 ENCOUNTER — Other Ambulatory Visit: Payer: Self-pay | Admitting: Internal Medicine

## 2012-06-08 MED ORDER — ONETOUCH ULTRASOFT LANCETS MISC: Status: AC

## 2012-06-08 MED ORDER — GLUCOSE BLOOD VI STRP: ORAL_STRIP | Status: DC

## 2012-06-08 NOTE — Telephone Encounter (Signed)
LM ON TRIAGE 1032AM NEEDS TEST STRIPS/NEEDLES FOR ONE TOUCH MONITER sent to [facility] on [address]  cb# [phone]

## 2012-06-08 NOTE — Telephone Encounter (Signed)
RXs sent.

## 2012-06-12 ENCOUNTER — Other Ambulatory Visit: Payer: Self-pay | Admitting: Internal Medicine

## 2012-06-15 ENCOUNTER — Other Ambulatory Visit: Payer: Self-pay | Admitting: Internal Medicine

## 2012-07-13 ENCOUNTER — Other Ambulatory Visit: Payer: Self-pay | Admitting: Internal Medicine

## 2012-07-17 ENCOUNTER — Other Ambulatory Visit: Payer: Self-pay | Admitting: Internal Medicine

## 2012-07-17 NOTE — Telephone Encounter (Signed)
Last Ordered in June, patient uses as needed. Hopp please advise if ok to refill

## 2012-07-17 NOTE — Telephone Encounter (Signed)
Left message to call office

## 2012-07-17 NOTE — Telephone Encounter (Signed)
Magic mouth wash 4oz  Ty:90 gargle and swallow 1 teaspoonful 3 times a day as needed

## 2012-07-17 NOTE — Telephone Encounter (Signed)
This medication should not be used repeatedly because of increased risk of sensitization to one of the 3 ingredients. It should only be used for thrush which occurs after antibiotics. A safer alternative would be using Zicam melts or zinc lozenges which have antiviral/antibacterial effect.

## 2012-07-23 ENCOUNTER — Other Ambulatory Visit (HOSPITAL_COMMUNITY): Payer: Self-pay | Admitting: Physician Assistant

## 2012-07-28 ENCOUNTER — Other Ambulatory Visit (HOSPITAL_COMMUNITY): Payer: Self-pay | Admitting: Internal Medicine

## 2012-08-04 ENCOUNTER — Other Ambulatory Visit: Payer: Self-pay | Admitting: *Deleted

## 2012-08-04 DIAGNOSIS — J029 Acute pharyngitis, unspecified: Secondary | ICD-10-CM

## 2012-08-04 MED ORDER — MAGIC MOUTHWASH: 5.0000 mL | Freq: Two times a day (BID) | ORAL | Status: DC | PRN

## 2012-08-04 NOTE — Telephone Encounter (Signed)
Refill for magic mouthwash sent to pharmacy.

## 2012-08-05 NOTE — Telephone Encounter (Signed)
Left message to call office  Called pharmacy and cancel Rx that was sent in on 08-05-11 for mouthwash due to [facility] previous comment.

## 2012-08-10 ENCOUNTER — Other Ambulatory Visit: Payer: Self-pay | Admitting: Internal Medicine

## 2012-08-11 NOTE — Telephone Encounter (Signed)
Left message on voicemail for patient to return call when available. Reason for call: inform patient to sign controlled substance contract and pick-up rx

## 2012-08-12 NOTE — Telephone Encounter (Signed)
Patient aware Controlled Substance Contract to be sign and rx to be picked up   

## 2012-08-31 ENCOUNTER — Other Ambulatory Visit: Payer: Self-pay | Admitting: Internal Medicine

## 2012-09-09 ENCOUNTER — Encounter: Payer: Self-pay | Admitting: Internal Medicine

## 2012-09-15 ENCOUNTER — Other Ambulatory Visit: Payer: Self-pay | Admitting: Internal Medicine

## 2012-09-15 NOTE — Telephone Encounter (Signed)
OK X1 

## 2012-09-15 NOTE — Telephone Encounter (Signed)
Last Filled 08-31-12 # 30, Pt states that she usually take 2 tab a day and with #30 she is running out before the month ends. .Please advise

## 2012-09-16 NOTE — Telephone Encounter (Signed)
#   60 OK

## 2012-09-16 NOTE — Telephone Encounter (Signed)
Hopp it appears that patient would like a higher dispense number-? #60 vs #30

## 2012-09-16 NOTE — Telephone Encounter (Signed)
OK X1, R X1 

## 2012-10-20 ENCOUNTER — Encounter: Payer: Self-pay | Admitting: Internal Medicine

## 2012-10-20 ENCOUNTER — Ambulatory Visit (INDEPENDENT_AMBULATORY_CARE_PROVIDER_SITE_OTHER): Payer: Medicare Other | Admitting: Internal Medicine

## 2012-10-20 ENCOUNTER — Other Ambulatory Visit: Payer: Self-pay | Admitting: Internal Medicine

## 2012-10-20 VITALS — BP 118/80 | HR 85 | Temp 98.0°F | Wt 136.0 lb

## 2012-10-20 DIAGNOSIS — G609 Hereditary and idiopathic neuropathy, unspecified: Secondary | ICD-10-CM

## 2012-10-20 DIAGNOSIS — E119 Type 2 diabetes mellitus without complications: Secondary | ICD-10-CM

## 2012-10-20 NOTE — Patient Instructions (Addendum)
Assess response to Cymbalta 30 mg daily for the feet symptoms. Monitor for any adverse reactions such as fever, rash, or swelling of the lips or tongue.

## 2012-10-20 NOTE — Progress Notes (Signed)
  Subjective:    Patient ID: Alexa [name], female    DOB: 14-Oct-1929, 79 y.o.   MRN: [id number]  HPI The pain began years ago in both feet as "pins & needles & hurting",  up to level 10. The pain does not radiate.  The discomfort lasts  constantly It is not definitely exacerbated by any factors.  Associated signs/symptoms include redness w/o swelling, stiffness, or  change temperature change The pain was treated with Tramadol& Tylenol ; response was partial.  Her recurrent urinary tract infections have resolved on a cranberry pill supplement  Her fasting blood sugars are averaging in the mid-160s fasting. The two-hour postprandial sugars have been up to 300  at times.  Her brother was recently hospitalized for sugars as high as 500.                                                                            Review of Systems Constitutional: no fever, chills, sweats. 35 # weight loss off HRT.  Musculoskeletal:no  muscle cramps or pain Skin:no rash Neuro: no weakness; incontinence (stool/urine). Some numbness in feet Heme:no lymphadenopathy; abnormal bruising or bleeding     Objective:   Physical Exam Gen.: Well-nourished in appearance. Alert, appropriate and cooperative throughout exam. Appears younger than stated age Neck: No deformities, masses, or tenderness noted. Range of motion & Thyroid normal. Cardiac: S4 without significant murmur.  Chest: No abnormal breath sounds or increased work of breathing.  Abdomen: No organomegaly, masses, or tenderness. No AAA.                            Musculoskeletal/extremities: Reverse curve noted of the lower  thoraco lumbar spine. No clubbing, cyanosis,or  edema noted. Tone & strength  normal.Joints  reveal marked mixed changes. Nail health good. Able to lie down & sit up w/o help. Negative SLR bilaterally Vascular: Carotid, radial artery, dorsalis pedis and  posterior tibial pulses are full and equal. No bruits present. Neurologic:  Alert and oriented x3. Deep tendon reflexes symmetrical and normal.  Light touch normal over feet. Skin: Intact without suspicious lesions or rashes. Lymph: No cervical, axillary lymphadenopathy present. Psych: Mood and affect are normal. Normally interactive                      Assessment & Plan:  #1 symptomatic peripheral neuropathy; possibly related to #2. Gabapentin had caused anaphylaxis by history. She has had some "weakness" on Cymbalta but no definite allergic reaction. The Cymbalta had improved her symptoms dramatically.  #2 dramatically elevated glucoses Plan: See orders and recommendations

## 2012-11-02 ENCOUNTER — Other Ambulatory Visit: Payer: Self-pay | Admitting: Internal Medicine

## 2012-11-27 ENCOUNTER — Other Ambulatory Visit: Payer: Self-pay | Admitting: Internal Medicine

## 2012-12-02 ENCOUNTER — Other Ambulatory Visit: Payer: Self-pay | Admitting: Internal Medicine

## 2012-12-26 ENCOUNTER — Encounter: Payer: Self-pay | Admitting: Internal Medicine

## 2013-01-01 ENCOUNTER — Encounter (INDEPENDENT_AMBULATORY_CARE_PROVIDER_SITE_OTHER): Payer: Medicare Other | Admitting: Ophthalmology

## 2013-01-01 DIAGNOSIS — H3530 Unspecified macular degeneration: Secondary | ICD-10-CM

## 2013-01-01 DIAGNOSIS — H35039 Hypertensive retinopathy, unspecified eye: Secondary | ICD-10-CM

## 2013-01-01 DIAGNOSIS — H34239 Retinal artery branch occlusion, unspecified eye: Secondary | ICD-10-CM

## 2013-01-01 DIAGNOSIS — I10 Essential (primary) hypertension: Secondary | ICD-10-CM

## 2013-01-01 DIAGNOSIS — H43819 Vitreous degeneration, unspecified eye: Secondary | ICD-10-CM

## 2013-01-01 DIAGNOSIS — H2700 Aphakia, unspecified eye: Secondary | ICD-10-CM

## 2013-01-20 ENCOUNTER — Other Ambulatory Visit: Payer: Self-pay | Admitting: Internal Medicine

## 2013-01-20 NOTE — Telephone Encounter (Signed)
Ok to refill? Last OV 3.25.14 Last filled 5.2.14

## 2013-01-20 NOTE — Telephone Encounter (Signed)
OK X1 

## 2013-02-08 ENCOUNTER — Telehealth: Payer: Self-pay | Admitting: *Deleted

## 2013-02-08 DIAGNOSIS — I10 Essential (primary) hypertension: Secondary | ICD-10-CM

## 2013-02-08 MED ORDER — LISINOPRIL 40 MG PO TABS: ORAL_TABLET | ORAL | Status: DC

## 2013-02-08 NOTE — Telephone Encounter (Signed)
Pt left VM that she would like to get some samples of wellbutrin,. .Left message to call office to advise Pt we do not sample this med.

## 2013-02-08 NOTE — Telephone Encounter (Signed)
Refilled Lisinopril 40 mg e-scribed to [facility] # [id number] in [place]

## 2013-02-09 NOTE — Telephone Encounter (Signed)
Pt advise no sample but request that we send in Rx for Wellbutrin. Pt notes that in the past she thought that she was allergic to med but found out that if was her BP med that was causing all her problems. Pt indicated that she took this med in the past and it worked good for her.Please advise

## 2013-02-12 ENCOUNTER — Ambulatory Visit (INDEPENDENT_AMBULATORY_CARE_PROVIDER_SITE_OTHER): Payer: Medicare Other | Admitting: Internal Medicine

## 2013-02-12 ENCOUNTER — Encounter: Payer: Self-pay | Admitting: Internal Medicine

## 2013-02-12 VITALS — BP 132/70 | HR 74 | Wt 136.0 lb

## 2013-02-12 DIAGNOSIS — F4323 Adjustment disorder with mixed anxiety and depressed mood: Secondary | ICD-10-CM

## 2013-02-12 MED ORDER — BUPROPION HCL 75 MG PO TABS: ORAL_TABLET | ORAL | Status: DC

## 2013-02-12 NOTE — Patient Instructions (Addendum)
After 10-14 days the generic Wellbutrin 75 mg can be increased to one twice a day. If this is the effective dose; refill will be for generic Wellbutrin 150 XL . Report any fever, rash, or swelling of the lips or tongue immediately.

## 2013-02-12 NOTE — Progress Notes (Signed)
  Subjective:    Patient ID: Alexa [name], female    DOB: 1929-11-26, 79 y.o.   MRN: [id number]  HPI  Her adopted daughter has had significant medical problems include alcohol related cirrhosis and dramatic mood swings. She is living in apartment attached to their home. 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 These factors are causing tremendous stress.  She's requesting generic Wellbutrin. The chart states that this caused a rash in 2008. She states that this is in error. She states that the rash was due to a blood pressure pill she was taken simultaneously.  She feels that the Wellbutrin was of benefit in controlling her anxiety. She is fearful of continuing Cymbalta because of the "many side effects" in the package insert.    Review of Systems  Constitutional: No significant change in weight,  sleep disruption, fatigue, weakness Cardiovascular: No palpitations, racing, irregularity, sweating Gastrointestinal: No nausea or vomiting,constipation, diarrhea Neuro: No tremor,headache  Psych: No  panic attacks,alcohol or drug abuse Endocrine: No change in skin/hair/nails       Objective:   Physical Exam Appears healthy and well-nourished & in no acute distress.Appears younger than stated age No proptosis or lid lag.   Thyroid normal   No carotid bruits are present.No neck pain distention present at 10 - 15 degrees. Thyroid normal to palpation Heart rhythm and rate are normal with no significant murmurs or gallops.  Chest is clear with no increased work of breathing  No clubbing, cyanosis or edema present.  Pedal pulses are intact   No ischemic skin changes are present . Nails healthy   Severe mixed arthritic hand changes  Alert and oriented. Strength, tone, DTRs reflexes normal. No tremor          Assessment & Plan:  #1 situational anxiety/depression  Plan gradual titration of generic Wellbutrin up to 150 mg daily. She'll be asked to monitor for any   adverse response or side effect

## 2013-02-26 ENCOUNTER — Other Ambulatory Visit: Payer: Self-pay | Admitting: Internal Medicine

## 2013-03-01 NOTE — Telephone Encounter (Signed)
DUE for UDS, patient informed and indicated she will stop by to pick-up rx and level specimen for UDS

## 2013-03-17 ENCOUNTER — Encounter: Payer: Self-pay | Admitting: Internal Medicine

## 2013-04-07 ENCOUNTER — Other Ambulatory Visit: Payer: Self-pay | Admitting: Internal Medicine

## 2013-04-07 NOTE — Telephone Encounter (Signed)
Last visit: 02/12/2013  Last filled: 02/26/2013 #60 0 refills  UDS: 08/13/2012-low risk  Contract on file  Please advise. SW, CMA

## 2013-04-07 NOTE — Telephone Encounter (Signed)
OK X1 

## 2013-04-09 ENCOUNTER — Other Ambulatory Visit: Payer: Self-pay

## 2013-04-09 MED ORDER — TRAMADOL HCL 50 MG PO TABS: ORAL_TABLET | ORAL | Status: DC

## 2013-04-09 NOTE — Telephone Encounter (Signed)
Rx faxed.    KP 

## 2013-04-30 ENCOUNTER — Other Ambulatory Visit: Payer: Self-pay | Admitting: Internal Medicine

## 2013-04-30 NOTE — Telephone Encounter (Signed)
Servent diskus refill sent to pharmacy

## 2013-04-30 NOTE — Telephone Encounter (Signed)
Med refilled.

## 2013-05-03 ENCOUNTER — Telehealth: Payer: Self-pay | Admitting: *Deleted

## 2013-05-03 DIAGNOSIS — F4323 Adjustment disorder with mixed anxiety and depressed mood: Secondary | ICD-10-CM

## 2013-05-03 MED ORDER — BUPROPION HCL 75 MG PO TABS: 75.0000 mg | ORAL_TABLET | Freq: Two times a day (BID) | ORAL | Status: DC

## 2013-05-03 NOTE — Telephone Encounter (Signed)
Rx filled and pt notified  

## 2013-05-03 NOTE — Telephone Encounter (Signed)
Pt called and stated  that she was to increase her Wellbutrin dosage to 1 tab po bid after 2 weeks.  Is it okay to send new Rx. SW, CMA

## 2013-05-03 NOTE — Addendum Note (Signed)
Addended by: Arnette Norris on: 05/03/2013 05:00 PM   Modules accepted: Orders

## 2013-05-03 NOTE — Telephone Encounter (Signed)
Bupropion 75 mg bid #60, R X 2

## 2013-05-07 ENCOUNTER — Ambulatory Visit (INDEPENDENT_AMBULATORY_CARE_PROVIDER_SITE_OTHER): Payer: Medicare Other | Admitting: Internal Medicine

## 2013-05-07 ENCOUNTER — Encounter: Payer: Self-pay | Admitting: Internal Medicine

## 2013-05-07 VITALS — BP 156/71 | HR 74 | Temp 98.5°F | Wt 134.0 lb

## 2013-05-07 DIAGNOSIS — F4323 Adjustment disorder with mixed anxiety and depressed mood: Secondary | ICD-10-CM

## 2013-05-07 DIAGNOSIS — R5381 Other malaise: Secondary | ICD-10-CM

## 2013-05-07 DIAGNOSIS — J0110 Acute frontal sinusitis, unspecified: Secondary | ICD-10-CM

## 2013-05-07 DIAGNOSIS — I951 Orthostatic hypotension: Secondary | ICD-10-CM

## 2013-05-07 LAB — CBC WITH DIFFERENTIAL/PLATELET
Basophils Absolute: 0.1 10*3/uL (ref 0.0–0.1)
Basophils Relative: 1 % (ref 0–1)
Eosinophils Absolute: 0.4 10*3/uL (ref 0.0–0.7)
HCT: 41.7 % (ref 36.0–46.0)
MCV: 83.1 fL (ref 78.0–100.0)
Monocytes Relative: 8 % (ref 3–12)
Neutro Abs: 4.8 10*3/uL (ref 1.7–7.7)
RBC: 5.02 MIL/uL (ref 3.87–5.11)
RDW: 13.7 % (ref 11.5–15.5)
WBC: 8.5 10*3/uL (ref 4.0–10.5)

## 2013-05-07 LAB — BASIC METABOLIC PANEL
Glucose, Bld: 94 mg/dL (ref 70–99)
Potassium: 4.2 mEq/L (ref 3.5–5.3)
Sodium: 139 mEq/L (ref 135–145)

## 2013-05-07 MED ORDER — AMOXICILLIN 500 MG PO CAPS: 500.0000 mg | ORAL_CAPSULE | Freq: Three times a day (TID) | ORAL | Status: DC

## 2013-05-07 MED ORDER — BUPROPION HCL 75 MG PO TABS: 75.0000 mg | ORAL_TABLET | Freq: Two times a day (BID) | ORAL | Status: DC

## 2013-05-07 NOTE — Patient Instructions (Signed)
Repeat the isometric exercises discussed 4- 5 times prior to standing if you've been seated for a period of time.Plain Mucinex (NOT D) for thick secretions ;force NON dairy fluids .   Nasal cleansing in the shower as discussed with lather of mild shampoo.After 10 seconds wash off lather while  exhaling through nostrils. Make sure that all residual soap is removed to prevent irritation.  Nasacort AQ OTC 1 spray in each nostril twice a day as needed. Use the "crossover" technique into opposite nostril spraying toward opposite ear @ 45 degree angle, not straight up into nostril.  Use a Neti pot daily only  as needed for significant sinus congestion; going from open side to congested side . Plain Allegra (NOT D )  160 daily , Loratidine 10 mg , OR Zyrtec 10 mg @ bedtime  as needed for itchy eyes & sneezing.

## 2013-05-07 NOTE — Progress Notes (Signed)
Subjective:    Patient ID: Alexa [name], female    DOB: 10-May-1930, 79 y.o.   MRN: [id number]  HPI   She's had the dizziness over the last week intermittently. It occurs when she stands or if she moves her head laterally quickly.  She has had some itchy, watery eyes and sneezing.  Recently she's had some fever and chills. She's actually had a cough for over a month. Initially this was associated with some yellow sputum which subsequently became dark gray. This was in context of exposure to RTIs in her great grand children. This is improving with vitamin C. She has also had frontal headaches as well as some pain in the cervical spine superiorly. She has had some purulent nasal secretions. The volume of secretions from the chest are greater than the from the sinuses.  She has chronic vision loss which is unrelated; she is seeing an ophthalmologist for this  She also has some tinnitus which is unrelated  She had numbness in the left foot after sitting for 3 hours but has no other limb symptoms.  She took some of her husbands Antivert with some response.  She describes anxiety and is requesting an increase in her dose of bupropion and also Xanax.   Review of Systems  She is not having persistent fever, chills, or sweats.  She is not having facial pain  There is no loss of vision or double vision.  There is no prodrome prior to symptoms of heart rhythm or rate change.  She also had no neurologic prodrome of headache, limb weakness, or limb numbness/tingling other than the left foot.       Objective:   Physical Exam Gen.: Healthy and well-nourished in appearance. Alert, appropriate and cooperative throughout exam.Appears younger than stated age  Head: Normocephalic without obvious abnormalities  Eyes: No corneal or conjunctival inflammation noted. Pupils equal round . FOV normal . No nystagmus. Extraocular motion intact. Vision grossly normal with lenses Ears: External  ear exam  reveals no significant lesions or deformities. Canals clear .TMs normal. Hearing is grossly normal bilaterally. Nose: External nasal exam reveals no deformity or inflammation. Nasal mucosa are pink and moist. No lesions or exudates noted.  Mouth: Oral mucosa and oropharynx reveal no lesions or exudates. Teeth in good repair. Neck: No deformities, masses, or tenderness noted. Range of motion slightly decreased Lungs: Normal respiratory effort; chest expands symmetrically. Lungs are clear to auscultation without rales, wheezes, or increased work of breathing. Heart: Normal rate and rhythm. Normal S1 and S2. No gallop, click, or rub.S4 w/o murmur. Abdomen: Bowel sounds normal; abdomen soft and nontender. No masses, organomegaly or hernias noted.                                   Musculoskeletal/extremities: No clubbing, cyanosis, or edema noted. Range of motion normal .Tone & strength  Normal. Joints  reveal mixed PIP/ DIP changes. Nail health good. Able to lie down & sit up w/o help. Negative SLR bilaterally Vascular: Carotid, radial artery, dorsalis pedis and  posterior tibial pulses are full and equal. No bruits present. Neurologic: Alert and oriented x3. Deep tendon reflexes symmetrical and normal.  Gait normal but + Rhomberg .        Skin: Intact without suspicious lesions or rashes. Lymph: No cervical, axillary lymphadenopathy present. Psych: Mood and affect are normal. Normally interactive  Assessment & Plan:  #1 postural hypotension #2 sinusitis #3 fatigue #4 anxiety; Xanax contraindicated with her present symptoms. Increase in dose of bupropion also in appropriate. This was discussed. See Orders

## 2013-05-07 NOTE — Addendum Note (Signed)
Addended by: Verdie Shire on: 05/07/2013 05:05 PM   Modules accepted: Orders

## 2013-05-08 LAB — TSH: TSH: 1.89 u[IU]/mL (ref 0.350–4.500)

## 2013-05-10 ENCOUNTER — Encounter: Payer: Self-pay | Admitting: *Deleted

## 2013-05-10 NOTE — Progress Notes (Signed)
Letter mailed to patient.

## 2013-05-28 ENCOUNTER — Other Ambulatory Visit: Payer: Self-pay | Admitting: Internal Medicine

## 2013-05-31 NOTE — Telephone Encounter (Signed)
OK X1 

## 2013-05-31 NOTE — Telephone Encounter (Signed)
Requesting Tramadol 50mg   Last refill:04-09-13 Last OV: 05-07-13 Please advise.//AB/CMA

## 2013-06-01 ENCOUNTER — Ambulatory Visit (INDEPENDENT_AMBULATORY_CARE_PROVIDER_SITE_OTHER): Payer: Medicare Other

## 2013-06-01 DIAGNOSIS — Z23 Encounter for immunization: Secondary | ICD-10-CM

## 2013-06-15 ENCOUNTER — Ambulatory Visit: Payer: Medicare Other | Admitting: Gastroenterology

## 2013-06-26 ENCOUNTER — Encounter (HOSPITAL_COMMUNITY): Payer: Self-pay | Admitting: Emergency Medicine

## 2013-06-26 ENCOUNTER — Emergency Department (HOSPITAL_COMMUNITY)
Admission: EM | Admit: 2013-06-26 | Discharge: 2013-06-26 | Disposition: A | Discharge status: Home / Self Care | Payer: Medicare Other | Attending: Emergency Medicine | Admitting: Emergency Medicine

## 2013-06-26 DIAGNOSIS — Z79899 Other long term (current) drug therapy: Secondary | ICD-10-CM | POA: Insufficient documentation

## 2013-06-26 DIAGNOSIS — M4800 Spinal stenosis, site unspecified: Secondary | ICD-10-CM | POA: Insufficient documentation

## 2013-06-26 DIAGNOSIS — I10 Essential (primary) hypertension: Secondary | ICD-10-CM | POA: Insufficient documentation

## 2013-06-26 DIAGNOSIS — Z86718 Personal history of other venous thrombosis and embolism: Secondary | ICD-10-CM | POA: Insufficient documentation

## 2013-06-26 DIAGNOSIS — K219 Gastro-esophageal reflux disease without esophagitis: Secondary | ICD-10-CM | POA: Insufficient documentation

## 2013-06-26 DIAGNOSIS — Z885 Allergy status to narcotic agent status: Secondary | ICD-10-CM | POA: Insufficient documentation

## 2013-06-26 DIAGNOSIS — J4489 Other specified chronic obstructive pulmonary disease: Secondary | ICD-10-CM | POA: Insufficient documentation

## 2013-06-26 DIAGNOSIS — Z8719 Personal history of other diseases of the digestive system: Secondary | ICD-10-CM | POA: Insufficient documentation

## 2013-06-26 DIAGNOSIS — Z860100 Personal history of colon polyps, unspecified: Secondary | ICD-10-CM | POA: Insufficient documentation

## 2013-06-26 DIAGNOSIS — N390 Urinary tract infection, site not specified: Secondary | ICD-10-CM | POA: Insufficient documentation

## 2013-06-26 DIAGNOSIS — R110 Nausea: Secondary | ICD-10-CM | POA: Insufficient documentation

## 2013-06-26 DIAGNOSIS — E1169 Type 2 diabetes mellitus with other specified complication: Secondary | ICD-10-CM | POA: Insufficient documentation

## 2013-06-26 DIAGNOSIS — K589 Irritable bowel syndrome without diarrhea: Secondary | ICD-10-CM | POA: Insufficient documentation

## 2013-06-26 DIAGNOSIS — Z85528 Personal history of other malignant neoplasm of kidney: Secondary | ICD-10-CM | POA: Insufficient documentation

## 2013-06-26 DIAGNOSIS — Z888 Allergy status to other drugs, medicaments and biological substances status: Secondary | ICD-10-CM | POA: Insufficient documentation

## 2013-06-26 DIAGNOSIS — E785 Hyperlipidemia, unspecified: Secondary | ICD-10-CM | POA: Insufficient documentation

## 2013-06-26 DIAGNOSIS — J449 Chronic obstructive pulmonary disease, unspecified: Secondary | ICD-10-CM | POA: Insufficient documentation

## 2013-06-26 LAB — COMPREHENSIVE METABOLIC PANEL
ALT: 17 U/L (ref 0–35)
AST: 36 U/L (ref 0–37)
CO2: 26 mEq/L (ref 19–32)
Chloride: 98 mEq/L (ref 96–112)
Creatinine, Ser: 1.14 mg/dL — ABNORMAL HIGH (ref 0.50–1.10)
GFR calc Af Amer: 51 mL/min — ABNORMAL LOW (ref >90)
GFR calc non Af Amer: 44 mL/min — ABNORMAL LOW (ref >90)
Glucose, Bld: 91 mg/dL (ref 70–99)
Sodium: 135 mEq/L (ref 135–145)
Total Bilirubin: 0.6 mg/dL (ref 0.3–1.2)

## 2013-06-26 LAB — CBC
Hemoglobin: 15.3 g/dL — ABNORMAL HIGH (ref 12.0–15.0)
MCV: 86.7 fL (ref 78.0–100.0)
Platelets: 291 10*3/uL (ref 150–400)
RBC: 5.2 MIL/uL — ABNORMAL HIGH (ref 3.87–5.11)
RDW: 14 % (ref 11.5–15.5)
WBC: 11.2 10*3/uL — ABNORMAL HIGH (ref 4.0–10.5)

## 2013-06-26 LAB — URINALYSIS, ROUTINE W REFLEX MICROSCOPIC
Bilirubin Urine: NEGATIVE
Hgb urine dipstick: NEGATIVE
Protein, ur: NEGATIVE mg/dL
Urobilinogen, UA: 0.2 mg/dL (ref 0.0–1.0)

## 2013-06-26 LAB — URINE MICROSCOPIC-ADD ON

## 2013-06-26 MED ORDER — CEFPODOXIME PROXETIL 100 MG PO TABS: 100.0000 mg | ORAL_TABLET | Freq: Two times a day (BID) | ORAL | Status: DC

## 2013-06-26 MED ORDER — LIDOCAINE HCL (PF) 1 % IJ SOLN
INTRAMUSCULAR | Status: AC
  Filled 2013-06-26: qty 5

## 2013-06-26 MED ORDER — CEFTRIAXONE SODIUM 1 G IJ SOLR
1.0000 g | Freq: Once | INTRAMUSCULAR | Status: AC
  Administered 2013-06-26: 1 g via INTRAMUSCULAR
  Filled 2013-06-26: qty 10

## 2013-06-26 NOTE — ED Provider Notes (Signed)
CSN: [id number]     Arrival date & time 06/26/13  1614 History   First MD Initiated Contact with Patient 06/26/13 1626     Chief Complaint  Patient presents with  . Hyperglycemia   (Consider location/radiation/quality/duration/timing/severity/associated sxs/prior Treatment) Patient is a 79 y.o. female presenting with hyperglycemia. The history is provided by the patient.  Hyperglycemia Blood sugar level PTA:  400 - 500 Severity:  Moderate Onset quality:  Sudden Timing:  Sporadic Progression:  Worsening Chronicity:  New Diabetes status:  Non-diabetic Current diabetic therapy:  None Context: recent illness (states she wasn't feeling well last night with some mild nausea)   Context: not change in medication, not insulin pump use, not new diabetes diagnosis, not noncompliance and not recent change in diet   Relieved by:  Nothing Ineffective treatments:  None tried Associated symptoms: nausea   Associated symptoms: no abdominal pain, no fever, no shortness of breath and no vomiting     Past Medical History  Diagnosis Date  . Chronic obstructive pulmonary disease   . Malignant neoplasm of kidney and other and unspecified urinary organs   . Personal history of venous thrombosis and embolism     PTE after nephrectomy  . Esophageal reflux   . Irritable bowel syndrome   . Unspecified essential hypertension   . Other and unspecified hyperlipidemia   . Diverticulosis of colon (without mention of hemorrhage)   . Spinal stenosis   . History of colonic polyps 06/05/05    hyperplastic  . Diverticulosis   . Internal hemorrhoids   . Type II or unspecified type diabetes mellitus without mention of complication, not stated as uncontrolled 02/05/2012    pt states it was in 2007  . Ischemic colitis    Past Surgical History  Procedure Laterality Date  . Septoplasty    . Tonsillectomy    . Breast biopsy    . Colonoscopy w/ polypectomy  2000  . Colonoscopy w/ polypectomy  07/2004     Hyperplastic polyps  . Dilation and curettage of uterus  03/2005    Uterine Polyps  . Nephrectomy      right,Dr Dahlstedt   Family History  Problem Relation Age of Onset  . Stroke Mother     onset in 47s  . Diabetes Mother   . Cancer Mother     bladder; nephrectomy for calculi/also uterine , TAH & BSO  . Aneurysm Mother     thoracic  . Depression Mother   . Stroke Brother   . Heart failure Brother   . Heart attack Other     maternal family history  . Heart failure Maternal Grandfather   . Lung cancer Father   . Heart failure Sister   . Depression Sister   . Alzheimer's disease Sister   . COPD Sister   . Aneurysm Brother     cns  . Depression Maternal Aunt     X2  . Bipolar disorder Sister   . Alcoholism Neg Hx    History  Substance Use Topics  . Smoking status: Never Smoker   . Smokeless tobacco: Never Used  . Alcohol Use: No   OB History   Grav Para Term Preterm Abortions TAB SAB Ect Mult Living                 Review of Systems  Constitutional: Negative for fever.  Respiratory: Negative for cough and shortness of breath.   Gastrointestinal: Positive for nausea. Negative for vomiting and abdominal pain.  All other systems reviewed and are negative.    Allergies  Gabapentin; Codeine; Cymbalta; and Verapamil  Home Medications   Current Outpatient Rx  Name  Route  Sig  Dispense  Refill  . acetaminophen (TYLENOL) 650 MG CR tablet   Oral   Take 650 mg by mouth every 8 (eight) hours as needed. For pain         . amoxicillin (AMOXIL) 500 MG capsule   Oral   Take 1 capsule (500 mg total) by mouth 3 (three) times daily.   30 capsule   0   . buPROPion (WELLBUTRIN) 75 MG tablet   Oral   Take 1 tablet (75 mg total) by mouth 2 (two) times daily.   60 tablet   2   . FLOVENT HFA 44 MCG/ACT inhaler      INHALE 1 TO 2 PUFFS BY MOUTH TWICE A DAY IF NEEDED   10.6 g   5   . glucose blood (ONE TOUCH ULTRA TEST) test strip      Check blood sugar once  daily as directed DX 250.00   100 each   1   . lisinopril (PRINIVIL,ZESTRIL) 40 MG tablet      take 1/2 tablet by mouth every morning   45 tablet   1   . PROAIR HFA 108 (90 BASE) MCG/ACT inhaler      use as directed   8.5 g   5   . ranitidine (ZANTAC) 150 MG tablet      take 1 tablet by mouth every 12 hours if needed for REFLUX.   180 tablet   1   . SEREVENT DISKUS 50 MCG/DOSE diskus inhaler      inhale 1 puff by mouth twice a day   1 Inhaler   5   . traMADol (ULTRAM) 50 MG tablet      take 1/2 to 1 tablet by mouth every 8 hours if needed   60 tablet   0    BP 142/73  Pulse 93  Temp(Src) 98.2 F (36.8 C) (Oral)  Resp 18  SpO2 97% Physical Exam  Nursing note and vitals reviewed. Constitutional: She is oriented to person, place, and time. She appears well-developed and well-nourished. No distress.  HENT:  Head: Normocephalic and atraumatic.  Mouth/Throat: Oropharynx is clear and moist.  Eyes: EOM are normal. Pupils are equal, round, and reactive to light.  Neck: Normal range of motion. Neck supple.  Cardiovascular: Normal rate and regular rhythm.  Exam reveals no friction rub.   No murmur heard. Pulmonary/Chest: Effort normal and breath sounds normal. No respiratory distress. She has no wheezes. She has no rales.  Abdominal: Soft. She exhibits no distension. There is no tenderness. There is no rebound.  Musculoskeletal: Normal range of motion. She exhibits no edema.  Neurological: She is alert and oriented to person, place, and time. No cranial nerve deficit. She exhibits normal muscle tone. Coordination normal.  Skin: No rash noted. She is not diaphoretic.    ED Course  Procedures (including critical care time) Labs Review Labs Reviewed  GLUCOSE, CAPILLARY  CBC  COMPREHENSIVE METABOLIC PANEL  URINALYSIS, ROUTINE W REFLEX MICROSCOPIC   Imaging Review No results found.  EKG Interpretation    Date/Time:  Saturday June 26 2013 17:18:00  EST Ventricular Rate:  99 PR Interval:  186 QRS Duration: 79 QT Interval:  368 QTC Calculation: 472 R Axis:   -29 Text Interpretation:  Age not entered, assumed to be  79  years old for purpose of ECG interpretation Sinus rhythm Atrial premature complexes Left ventricular hypertrophy Inferior infarct, old PACs new, otherwise similar to prior Confirmed by [facility]  MD, Lyrah Bradt ([id number]) on 06/26/2013 5:38:14 PM            MDM   1. UTI (urinary tract infection)    54F presents with concerns for high blood sugar. Patient's high blood sugar was last night. She checks her sugar daily, however does not have diabetes. She is not currently on any insulin or oral hypoglycemic medications. She denies any chest pain, shortness of breath, fever, vomiting. She does have some abdominal burning, however that has been going on for about a month due to a chronic UTI. She's currently on Cipro for that. She is not on any current steroids. Here vitals are stable. Patient well-appearing. Her exam is benign. We will check basic labs here. Her initial sugar is 95. She states she wants to go home. I believe this is reasonable for her to go home as long as her labs are okay and no further hyperglycemia is noted. All labs normal except for UA, which shows UTI. I offered a shot of Rocephin and to give her a new antibiotic as her current therapy is not working. Patient agreed. Placed on Vantin.     Dagmar Hait, MD 06/27/13 [phone]

## 2013-06-26 NOTE — ED Notes (Signed)
Pt is here with elevated blood sugar machine as high as 500 and then down to 400s.  Pt reports that she does not take anything for diabetes.  Pt is on an antibiotic for a bladder infection over the last 2 months

## 2013-06-26 NOTE — ED Notes (Signed)
Pt requesting to be placed on 2L for comfort measures. 96 % on RA. MD Gwendolyn Grant at bedside.

## 2013-06-28 LAB — URINE CULTURE: Colony Count: >=100000

## 2013-06-29 ENCOUNTER — Other Ambulatory Visit: Payer: Self-pay | Admitting: Internal Medicine

## 2013-06-29 ENCOUNTER — Telehealth (HOSPITAL_COMMUNITY): Payer: Self-pay | Admitting: Emergency Medicine

## 2013-06-29 NOTE — Telephone Encounter (Signed)
Flovent refilled per protocol

## 2013-06-29 NOTE — ED Notes (Signed)
Post ED Visit - Positive Culture Follow-up  Culture report reviewed by antimicrobial stewardship pharmacist: []  Wes Dulaney, Pharm.D., BCPS [x]  Celedonio Miyamoto, Pharm.D., BCPS []  Georgina Pillion, Pharm.D., BCPS []  [place], [address].D., BCPS, AAHIVP []  Estella Husk, Pharm.D., BCPS, AAHIVP  Positive urine culture Treated with Cefpodoxime, organism sensitive to the same and no further patient follow-up is required at this time.  Kylie A Holland 06/29/2013, 1:43 PM

## 2013-07-02 ENCOUNTER — Telehealth: Payer: Self-pay | Admitting: Internal Medicine

## 2013-07-02 NOTE — Telephone Encounter (Signed)
Patient called and stated that the meter we gave her is not properly working. Patient would like another one.

## 2013-07-06 NOTE — Telephone Encounter (Signed)
Called and left message for patient informing her that a new glucometer will be at our front desk for her to pick up.

## 2013-07-24 ENCOUNTER — Other Ambulatory Visit: Payer: Self-pay | Admitting: Internal Medicine

## 2013-07-26 NOTE — Telephone Encounter (Signed)
Tramadol prescription faxed to patient's pharmacy. JG//CMA

## 2013-07-26 NOTE — Telephone Encounter (Signed)
traMADol (ULTRAM) 50 MG tablet Last refill: historical medication  Last OV: 05/07/2013 UDS up-to-date

## 2013-07-26 NOTE — Telephone Encounter (Signed)
OK X1 

## 2013-08-16 ENCOUNTER — Telehealth: Payer: Self-pay | Admitting: Internal Medicine

## 2013-08-16 NOTE — Telephone Encounter (Signed)
Patient is calling to see if we have anymore samples available for the salmeterol (SEREVENT) 50 MCG/DOSE diskus inhaler that she was given. She accidentally threw away the one she was given. Please advise.

## 2013-08-16 NOTE — Telephone Encounter (Signed)
Called and left message for patient informing her that we are currently are out of Serevent samples and that we will call her when we have some available. JG//CMA

## 2013-08-20 ENCOUNTER — Other Ambulatory Visit: Payer: Self-pay | Admitting: Internal Medicine

## 2013-08-23 NOTE — Telephone Encounter (Signed)
Lisinopril refilled per protocol. JG//CMA 

## 2013-09-10 ENCOUNTER — Other Ambulatory Visit: Payer: Self-pay | Admitting: Internal Medicine

## 2013-09-15 NOTE — Telephone Encounter (Signed)
Requesting Tramadol 50mg  Take 1/2 -1 tablet by mouth every 8 hours if needed. Last refill:07-24-13:#60 Last OV:05-07-13 UDS:03-01-13 Please advise.//AB/CMA

## 2013-09-15 NOTE — Telephone Encounter (Signed)
Okay x1

## 2013-09-16 ENCOUNTER — Telehealth: Payer: Self-pay | Admitting: General Practice

## 2013-09-16 NOTE — Telephone Encounter (Signed)
Rx printed and faxed to the pharmacy.//AB/CMA 

## 2013-09-16 NOTE — Telephone Encounter (Signed)
Tramadol refill request Last OV 05/07/13 Med filled 07/24/13 #60 with 0

## 2013-09-22 NOTE — Telephone Encounter (Signed)
Was this ever filled?

## 2013-10-14 ENCOUNTER — Telehealth: Payer: Self-pay | Admitting: Internal Medicine

## 2013-10-14 ENCOUNTER — Other Ambulatory Visit: Payer: Self-pay | Admitting: Internal Medicine

## 2013-10-14 MED ORDER — LISINOPRIL 40 MG PO TABS: 40.0000 mg | ORAL_TABLET | Freq: Every day | ORAL | Status: DC

## 2013-10-14 NOTE — Telephone Encounter (Signed)
Please clarify sig.

## 2013-10-14 NOTE — Telephone Encounter (Signed)
Pt made aware, she states she will call back to schedule an appt.

## 2013-10-14 NOTE — Telephone Encounter (Signed)
Patient called and stated that she needs a refill for lisinopril. She states that the directions say to take 1/2 tab daily, but she has been taking a whole tablet daily. Please advise. JG//CMA

## 2013-10-14 NOTE — Telephone Encounter (Signed)
Rx sent to pharmacy for 1 qd. Last seen 10/14 . Needs BP F/U by 5/15 to assess this dose

## 2013-10-18 ENCOUNTER — Telehealth: Payer: Self-pay | Admitting: *Deleted

## 2013-10-18 NOTE — Telephone Encounter (Signed)
Requesting Tramadol 50mg  Take 1/2 -1 tablet by mouth every 8 hours if needed for pain. Last refill:09-16-13;#90,0 Last OV:10-10--14 UDS:03-01-13 Please advise.//AB/CMA

## 2013-10-18 NOTE — Telephone Encounter (Signed)
OK X1 

## 2013-10-19 ENCOUNTER — Other Ambulatory Visit: Payer: Self-pay | Admitting: Internal Medicine

## 2013-10-19 MED ORDER — TRAMADOL HCL 50 MG PO TABS: ORAL_TABLET | ORAL | Status: DC

## 2013-10-19 NOTE — Telephone Encounter (Signed)
Rx printed and faxed to the pharmacy.//AB/CMA 

## 2013-10-19 NOTE — Addendum Note (Signed)
Addended by: Harl Bowie on: 10/19/2013 05:20 PM   Modules accepted: Orders

## 2013-10-26 ENCOUNTER — Encounter: Payer: Self-pay | Admitting: Internal Medicine

## 2013-10-26 ENCOUNTER — Other Ambulatory Visit (INDEPENDENT_AMBULATORY_CARE_PROVIDER_SITE_OTHER): Payer: Commercial Managed Care - HMO

## 2013-10-26 ENCOUNTER — Ambulatory Visit (INDEPENDENT_AMBULATORY_CARE_PROVIDER_SITE_OTHER): Payer: Commercial Managed Care - HMO | Admitting: Internal Medicine

## 2013-10-26 VITALS — BP 140/80 | HR 76 | Temp 97.7°F | Wt 137.2 lb

## 2013-10-26 DIAGNOSIS — F411 Generalized anxiety disorder: Secondary | ICD-10-CM

## 2013-10-26 DIAGNOSIS — T887XXA Unspecified adverse effect of drug or medicament, initial encounter: Secondary | ICD-10-CM

## 2013-10-26 DIAGNOSIS — I10 Essential (primary) hypertension: Secondary | ICD-10-CM

## 2013-10-26 LAB — BASIC METABOLIC PANEL
BUN: 22 mg/dL (ref 6–23)
CHLORIDE: 102 meq/L (ref 96–112)
CO2: 25 mEq/L (ref 19–32)
Calcium: 9.4 mg/dL (ref 8.4–10.5)
Creatinine, Ser: 1.2 mg/dL (ref 0.4–1.2)
GFR: 45.72 mL/min — AB (ref >60.00)
Glucose, Bld: 96 mg/dL (ref 70–99)
POTASSIUM: 4.3 meq/L (ref 3.5–5.1)
SODIUM: 138 meq/L (ref 135–145)

## 2013-10-26 LAB — ALT: ALT: 24 U/L (ref 0–35)

## 2013-10-26 LAB — AST: AST: 20 U/L (ref 0–37)

## 2013-10-26 MED ORDER — LISINOPRIL 40 MG PO TABS: 40.0000 mg | ORAL_TABLET | Freq: Every day | ORAL | Status: DC

## 2013-10-26 NOTE — Patient Instructions (Signed)

## 2013-10-26 NOTE — Progress Notes (Signed)
   Subjective:    Patient ID: Alexa [name], female    DOB: 11/10/1929, 80 y.o.   MRN: [id number]  HPI  Issue with pharmacy regarding lisinopril refill this month; she was given 12 pills 10 days ago and pharmacy states that she can refill on Monday, 4/6.  She has been taking her husband's lisinopril which he had left over.   Is interested in estrogen cream sample; Dr. Zannie Cove recommended estrogen cream and cranberry pills to reduce recurrent kidney infections. She did not refill the estrogen cream Rx due to cost.   She would also like to discuss her anxiety; she has experienced increased anxiety due to recent family stressor.   Blood pressure range / average: 130/75 Compliant with anti hypertemsive medication.  No lightheadedness or other adverse medication effect described.  A modified heart healthy /low salt diet is followed.  Exercise is limited due to winter weather.  Family history is + for HTN /CVA   Review of Systems Significant headaches, epistaxis, chest pain, palpitations, exertional dyspnea, claudication, paroxysmal nocturnal dyspnea, or edema absent.      Objective:   Physical Exam Appears healthy and well-nourished & in no acute distress. No carotid bruits are present.No neck pain distention present at 10 - 15 degrees. Thyroid normal to palpation.  Heart rhythm and rate are normal with no gallop or murmur.  Chest is clear with no increased work of breathing. There is no evidence of aortic aneurysm or renal artery bruits.  Abdomen soft with no organomegaly or masses. No HJR  No clubbing, cyanosis or edema present.  Pedal pulses are intact.  No ischemic skin changes are present . Fingernails/ toenails healthy  Alert and oriented. Strength, tone, DTRs reflexes normal       Assessment & Plan:  #1 hypertension - refill lisinopril

## 2013-10-26 NOTE — Progress Notes (Signed)
Pre visit review using our clinic review tool, if applicable. No additional management support is needed unless otherwise documented below in the visit note. 

## 2013-10-26 NOTE — Progress Notes (Signed)
   Subjective:    Patient ID: Alexa [name], female    DOB: 1929/09/08, 80 y.o.   MRN: [id number]  HPI  Issue with pharmacy regarding lisinopril refill this month; she was given 12 pills 10 days ago and pharmacy states that she can refill on Monday, 4/6.  She has been taking her husband's lisinopril which he had left over.  Is interested in estrogen cream sample; Dr. Zannie Cove recommended estrogen cream and cranberry pills to reduce recurrent kidney infections. She did not refill the estrogen cream Rx due to cost.  She would also like to discuss her anxiety; she has experienced increased anxiety due to recent family stressor.  Blood pressure range / average: 130/75  Compliant with anti hypertemsive medication.  No lightheadedness or other adverse medication effect described.  A modified heart healthy /low salt diet is followed.  Exercise is limited due to winter weather.  Family history is + for HTN /CVA     Review of Systems Significant headaches, epistaxis, chest pain, palpitations, exertional dyspnea, claudication, paroxysmal nocturnal dyspnea, or edema absent.      Objective:   Physical Exam   Appears healthy and well-nourished & in no acute distress. Appears younger than stated age No carotid bruits are present.No neck pain distention present at 10 - 15 degrees. Thyroid normal to palpation.  Heart rhythm and rate are normal with no gallop or murmur.  Chest is clear with no increased work of breathing.  There is no evidence of aortic aneurysm or renal artery bruits.  Abdomen soft with no organomegaly or masses. No HJR  No clubbing, cyanosis or edema present.  Pedal pulses are intact.  No ischemic skin changes are present . Fingernails healthy. Severe mixed arthritic changes Alert and oriented. Strength, tone, DTRs reflexes normal      Assessment & Plan:  #1 hypertension - refill lisinopril or confirm dosage with pharmacy.  #2 vaginal dryness - estrogen cream as per   Urology or Gyn #3 anxiety - Wellbutrin bid;check  BMET , AST,ALT

## 2014-02-15 ENCOUNTER — Other Ambulatory Visit: Payer: Self-pay | Admitting: *Deleted

## 2014-02-15 MED ORDER — TRAMADOL HCL 50 MG PO TABS: ORAL_TABLET | ORAL | Status: DC

## 2014-02-15 MED ORDER — LISINOPRIL 40 MG PO TABS: 40.0000 mg | ORAL_TABLET | Freq: Every day | ORAL | Status: DC

## 2014-02-15 NOTE — Telephone Encounter (Signed)
Left msg on triage stating pharmacy has been trying to get refills on her medications. Still no heard back from office. Called pt back verified which medicine she is needing. Lisinopril & tramadol. Inform pt will send to [facility]...Alexa [name]

## 2014-02-15 NOTE — Telephone Encounter (Signed)
Faxed tramadol to [facility]...Alexa [name]

## 2014-02-15 NOTE — Telephone Encounter (Signed)
OK #30 

## 2014-02-25 ENCOUNTER — Encounter: Payer: Self-pay | Admitting: Internal Medicine

## 2014-02-25 ENCOUNTER — Other Ambulatory Visit (INDEPENDENT_AMBULATORY_CARE_PROVIDER_SITE_OTHER): Payer: Commercial Managed Care - HMO

## 2014-02-25 ENCOUNTER — Ambulatory Visit (INDEPENDENT_AMBULATORY_CARE_PROVIDER_SITE_OTHER): Payer: Commercial Managed Care - HMO | Admitting: Internal Medicine

## 2014-02-25 VITALS — BP 150/86 | HR 80 | Temp 98.1°F | Wt 140.0 lb

## 2014-02-25 DIAGNOSIS — R5383 Other fatigue: Principal | ICD-10-CM

## 2014-02-25 DIAGNOSIS — R7309 Other abnormal glucose: Secondary | ICD-10-CM

## 2014-02-25 DIAGNOSIS — R5381 Other malaise: Secondary | ICD-10-CM

## 2014-02-25 DIAGNOSIS — R739 Hyperglycemia, unspecified: Secondary | ICD-10-CM

## 2014-02-25 LAB — CBC WITH DIFFERENTIAL/PLATELET
BASOS PCT: 0.3 % (ref 0.0–3.0)
Basophils Absolute: 0 10*3/uL (ref 0.0–0.1)
EOS PCT: 6.8 % — AB (ref 0.0–5.0)
Eosinophils Absolute: 0.6 10*3/uL (ref 0.0–0.7)
HCT: 45.7 % (ref 36.0–46.0)
Hemoglobin: 15.4 g/dL — ABNORMAL HIGH (ref 12.0–15.0)
Lymphocytes Relative: 26.8 % (ref 12.0–46.0)
Lymphs Abs: 2.4 10*3/uL (ref 0.7–4.0)
MCHC: 33.8 g/dL (ref 30.0–36.0)
MCV: 89.1 fl (ref 78.0–100.0)
MONO ABS: 0.6 10*3/uL (ref 0.1–1.0)
MONOS PCT: 6.5 % (ref 3.0–12.0)
Neutro Abs: 5.4 10*3/uL (ref 1.4–7.7)
Neutrophils Relative %: 59.6 % (ref 43.0–77.0)
PLATELETS: 337 10*3/uL (ref 150.0–400.0)
RBC: 5.13 Mil/uL — AB (ref 3.87–5.11)
RDW: 14.2 % (ref 11.5–15.5)
WBC: 9.1 10*3/uL (ref 4.0–10.5)

## 2014-02-25 LAB — MICROALBUMIN / CREATININE URINE RATIO
Creatinine,U: 74.5 mg/dL
MICROALB UR: 0.9 mg/dL (ref 0.0–1.9)
MICROALB/CREAT RATIO: 1.2 mg/g (ref 0.0–30.0)

## 2014-02-25 LAB — BASIC METABOLIC PANEL
BUN: 21 mg/dL (ref 6–23)
CHLORIDE: 103 meq/L (ref 96–112)
CO2: 26 mEq/L (ref 19–32)
CREATININE: 1.2 mg/dL (ref 0.4–1.2)
Calcium: 9.6 mg/dL (ref 8.4–10.5)
GFR: 43.99 mL/min — AB (ref >60.00)
Glucose, Bld: 118 mg/dL — ABNORMAL HIGH (ref 70–99)
Potassium: 4.5 mEq/L (ref 3.5–5.1)
Sodium: 140 mEq/L (ref 135–145)

## 2014-02-25 LAB — TSH: TSH: 1.35 u[IU]/mL (ref 0.35–4.50)

## 2014-02-25 LAB — HEMOGLOBIN A1C: HEMOGLOBIN A1C: 6 % (ref 4.6–6.5)

## 2014-02-25 NOTE — Progress Notes (Signed)
Pre visit review using our clinic review tool, if applicable. No additional management support is needed unless otherwise documented below in the visit note. 

## 2014-02-25 NOTE — Progress Notes (Signed)
   Subjective:    Patient ID: Alexa [name], female    DOB: Apr 06, 1930, 80 y.o.   MRN: [id number]  HPI   She's having dramatic elevations of her glucose up to 300 or higher. The range is 142-334 with an average of 211. She does not check glucose  2 hours after meal. She has no hypoglycemia  She has no regular exercise program or specific dietary program. She is on no medications for hyperglycemia at this time  She had an eye exam in February this year which resulted in a slight change in her prescription. No retinopathy was noted  She has no current foot care on record  Her last A1c was 6% on 10/20/12.  She states that her glucometer is a new machine.    Review of Systems  She does describe fatigue.  She also has some anxiety is questioning need for a "nerve pill". By history she was intolerant to Cymbalta. She is on bupropion 75 mg twice a day.  Diabetes review of systems is negative.Polyuria, polyphagia, polydipsia absent. There is no blurred vision, double vision, or loss of vision.  Also denied are numbness, tingling, or burning of the extremities. No nonhealing skin lesions present. Weight is stable.        Objective:   Physical Exam Significant or distinguishing  findings on physical exam include: She has an S4; she is occasional premature beat She appears much younger than stated age; she appears well-nourished and in no distress.  General appearance :adequately nourished; in no distress. Eyes: No conjunctival inflammation or scleral icterus is present. Heart:  Normal rate and regular rhythm. S1 and S2 normal without gallop, murmur, click, or rub. Lungs:Chest clear to auscultation; no wheezes, rhonchi,rales ,or rubs present.No increased work of breathing.  Abdomen: bowel sounds normal, soft and non-tender without masses, organomegaly or hernias noted.  No guarding or rebound.  Skin:Warm & dry.  Intact without suspicious lesions or rashes ; no jaundice or  tenting Lymphatic: No lymphadenopathy is noted about the head, neck, axilla       Assessment & Plan:  #1 markedly variable glucoses  #2 fatigue  See orders &  recommendations

## 2014-02-25 NOTE — Progress Notes (Signed)
Subjective:    Patient ID: Alexa [name], female    DOB: [birth year], 80 y.o.   MRN: [id number]  Diabetes   Fasting Hyperglycemia status assessment: Fasting or morning glucose is monitored:  Range: 142-334 Avg: 211 Highest glucose 2 hours after any meal is not monitored.  No hypoglycemia reported                                                                                                                 No regular exercise. No specific nutrition/diet followed Not on medications for hyperglycemia Eye exam current as of 2/15 with a slight change in prescription.  Foot care not current.  A1C was 6.0 on 10/20/2012  Review of Systems No excess thirst ;  excess hunger ; or excess urination reported                              No lightheadedness with standing reported No chest pain ; palpitations ; claudication described .             No non healing skin  ulcers or sores of extremities noted. No numbness or tingling or burning in feet described               No significant change in weight of pound gain pound loss. No blurred,double, or loss of vision reported  .                                                                                                 Objective:   Physical Exam  Constitutional: She is oriented to person, place, and time. She appears well-nourished. No distress.  HENT:  Head: Normocephalic and atraumatic.  Right Ear: External ear normal.  Left Ear: External ear normal.  Nose: Nose normal.  Mouth/Throat: Oropharynx is clear and moist. No oropharyngeal exudate.  Eyes: Conjunctivae and EOM are normal. Pupils are equal, round, and reactive to light. Right eye exhibits no discharge. Left eye exhibits no discharge. No scleral icterus.  Neck: Normal range of motion. Neck supple. No JVD present. No tracheal deviation present. No thyromegaly present.  Cardiovascular: Normal rate, regular rhythm, normal heart sounds and intact distal pulses.  Exam reveals no gallop and  no friction rub.   No murmur heard. Pulmonary/Chest: Effort normal and breath sounds normal. No respiratory distress. She has no wheezes. She has no rales. She exhibits no tenderness.  Abdominal: Soft. Bowel sounds are normal. She exhibits no distension and no mass. There is no tenderness. There is no rebound and no guarding.  Musculoskeletal: Normal range  of motion. She exhibits no edema and no tenderness.  Lymphadenopathy:    She has no cervical adenopathy.  Neurological: She is alert and oriented to person, place, and time. She has normal reflexes. She displays normal reflexes. No cranial nerve deficit. She exhibits normal muscle tone. Coordination normal.  Skin: Skin is warm and dry. No rash noted. She is not diaphoretic. No erythema. No pallor.  Psychiatric: She has a normal mood and affect. Her behavior is normal. Judgment and thought content normal.          Assessment & Plan:

## 2014-02-25 NOTE — Patient Instructions (Signed)
Your next office appointment will be determined based upon review of your pending labs . Those instructions will be transmitted to you  by mail. 

## 2014-03-01 ENCOUNTER — Telehealth: Payer: Self-pay | Admitting: *Deleted

## 2014-03-01 MED ORDER — GLUCOSE BLOOD VI STRP: ORAL_STRIP | Status: DC

## 2014-03-01 MED ORDER — ONETOUCH DELICA LANCETS 33G MISC: Status: DC

## 2014-03-01 NOTE — Telephone Encounter (Signed)
Pt left msg need to get another blood sugar monitor. Called pt back inform her will leave one touch verio monitor for her to pick & will send strips & lancets to the pharmacy...Johny Chess

## 2014-03-08 ENCOUNTER — Telehealth: Payer: Self-pay | Admitting: *Deleted

## 2014-03-08 MED ORDER — GLUCOSE BLOOD VI STRP: ORAL_STRIP | Status: DC

## 2014-03-08 NOTE — Telephone Encounter (Signed)
Pt call and requesting refill on her one touch strips & tramadol. She stated md use to give her # 60 on her tramadol, but the last rx was filled for # 30 she take 1-2 pills a day. Pls advice on tramadol, already sent refill on glucose strips...Johny Chess

## 2014-03-09 MED ORDER — TRAMADOL HCL 50 MG PO TABS: ORAL_TABLET | ORAL | Status: DC

## 2014-03-09 NOTE — Telephone Encounter (Signed)
Notified pt with md response. Faxed script to [facility]...Alexa [name]

## 2014-03-09 NOTE — Telephone Encounter (Signed)
#  43 OK but prn only, not on regular schedule to minimize kidney risks

## 2014-03-14 ENCOUNTER — Encounter: Payer: Self-pay | Admitting: Internal Medicine

## 2014-04-18 ENCOUNTER — Other Ambulatory Visit: Payer: Self-pay

## 2014-04-18 MED ORDER — TRAMADOL HCL 50 MG PO TABS: ORAL_TABLET | ORAL | Status: DC

## 2014-04-18 NOTE — Telephone Encounter (Signed)
OK X1 

## 2014-04-18 NOTE — Telephone Encounter (Signed)
Last office visit on 7.31.15

## 2014-04-18 NOTE — Telephone Encounter (Signed)
Tramadol has been called to pharmacy voicemail

## 2014-04-20 ENCOUNTER — Telehealth: Payer: Self-pay | Admitting: Internal Medicine

## 2014-04-20 NOTE — Telephone Encounter (Signed)
Patient Information:  Caller Name: Alexa [name]  Phone: [phone]  Patient: Alexa [name], Alexa [name]  Gender: Female  DOB: 10/20/1929  Age: 80 Years  PCP: Unice Cobble  Office Follow Up:  Does the office need to follow up with this patient?: No  Instructions For The Office: N/A  RN Note:  suggested an appt to get an abx; thought the Zpak was for allergies; informed her that it may have been given to her for sinusitis; will try Zyrtec and then call back about an appt  Symptoms  Reason For Call & Symptoms: says she is fighting allergy sxs; having a stopped up nose; draining from her head; c/o sinus HA; requesting Minor History In EMR: Yes  Reviewed Medications In EMR: Yes  Reviewed Allergies In EMR: Yes  Reviewed Surgeries / Procedures: Yes  Date of Onset of Symptoms: Unknown  Treatments Tried: Benadryl, nose spray, Tramadol  Treatments Tried Worked: No  Guideline(s) Used:  Colds  Disposition Per Guideline:   See Today or Tomorrow in Office  Reason For Disposition Reached:   Using nasal washes and pain medicine > 24 hours and sinus pain (lower forehead, cheekbone, or eye) persists  Advice Given:  N/A  RN Overrode Recommendation:  Follow Up With Office Later  will try Zyrtec prior to an appt

## 2014-04-20 NOTE — Telephone Encounter (Signed)
The standard of care is that antibiotics are not prescribed without an office visit.This policy is to help prevent serious health threatening infections with antibiotic resistant organisms or complications such as antibiotic-induced colitis. 

## 2014-04-21 ENCOUNTER — Other Ambulatory Visit: Payer: Self-pay | Admitting: Internal Medicine

## 2014-05-26 ENCOUNTER — Telehealth: Payer: Self-pay | Admitting: Internal Medicine

## 2014-05-26 MED ORDER — TRAMADOL HCL 50 MG PO TABS: ORAL_TABLET | ORAL | Status: DC

## 2014-05-26 NOTE — Telephone Encounter (Signed)
Pt called in requesting a refill on traMADol (ULTRAM) 50 MG tablet [[id number]]

## 2014-05-26 NOTE — Telephone Encounter (Signed)
Notified pt refill called into [facility]. Spoke with Jane/pharmacist.../lmb

## 2014-05-26 NOTE — Telephone Encounter (Signed)
OK #30 

## 2014-06-04 ENCOUNTER — Other Ambulatory Visit: Payer: Self-pay | Admitting: Internal Medicine

## 2014-06-16 ENCOUNTER — Other Ambulatory Visit: Payer: Self-pay | Admitting: Internal Medicine

## 2014-06-16 MED ORDER — TRAMADOL HCL 50 MG PO TABS: ORAL_TABLET | ORAL | Status: DC

## 2014-06-16 NOTE — Telephone Encounter (Signed)
OK #30 

## 2014-06-16 NOTE — Telephone Encounter (Signed)
10.29.15 #30 last phoned to pharmacy  7.31.15 last office visit

## 2014-06-16 NOTE — Telephone Encounter (Signed)
Tramadol has been called to [facility] on [address] 

## 2014-06-16 NOTE — Telephone Encounter (Signed)
Pt called in requesting a refill on traMADol (ULTRAM) 50 MG tablet. [facility]/[address]

## 2014-07-13 ENCOUNTER — Telehealth: Payer: Self-pay | Admitting: *Deleted

## 2014-07-13 NOTE — Telephone Encounter (Signed)
OK X1 mo 

## 2014-07-13 NOTE — Telephone Encounter (Signed)
Left msg on triage requesting refill on her tramadol.../lmb 

## 2014-07-14 ENCOUNTER — Other Ambulatory Visit: Payer: Self-pay

## 2014-07-14 MED ORDER — TRUERESULT BLOOD GLUCOSE W/DEVICE KIT
PACK | Status: DC
Start: 2014-07-14 — End: 2020-11-09

## 2014-07-14 MED ORDER — TRAMADOL HCL 50 MG PO TABS: ORAL_TABLET | ORAL | Status: DC

## 2014-07-14 MED ORDER — TRUEPLUS LANCETS 28G MISC: Status: DC

## 2014-07-14 MED ORDER — GLUCOSE BLOOD VI STRP: ORAL_STRIP | Status: DC

## 2014-07-14 NOTE — Telephone Encounter (Signed)
Called refill into [facility] pt has been notified...Alexa [name]

## 2014-07-14 NOTE — Telephone Encounter (Signed)
Patient called back in regards.  °

## 2014-07-21 ENCOUNTER — Telehealth: Payer: Self-pay

## 2014-07-21 NOTE — Telephone Encounter (Signed)
Received a fax from [facility] pharmacy requesting clarification on using the test strips and lancets. Per Dr Linna Darner, patient's last A1C was non diabetic. This info was faxed back to [facility] and scanned in patients chart.

## 2014-08-11 ENCOUNTER — Ambulatory Visit (INDEPENDENT_AMBULATORY_CARE_PROVIDER_SITE_OTHER): Payer: Commercial Managed Care - HMO | Admitting: Internal Medicine

## 2014-08-11 ENCOUNTER — Other Ambulatory Visit (INDEPENDENT_AMBULATORY_CARE_PROVIDER_SITE_OTHER): Payer: Commercial Managed Care - HMO

## 2014-08-11 ENCOUNTER — Encounter: Payer: Commercial Managed Care - HMO | Admitting: Internal Medicine

## 2014-08-11 ENCOUNTER — Encounter: Payer: Self-pay | Admitting: Internal Medicine

## 2014-08-11 VITALS — BP 108/80 | HR 94 | Temp 97.7°F | Resp 16 | Ht 61.0 in | Wt 139.0 lb

## 2014-08-11 DIAGNOSIS — E785 Hyperlipidemia, unspecified: Secondary | ICD-10-CM | POA: Diagnosis not present

## 2014-08-11 DIAGNOSIS — K219 Gastro-esophageal reflux disease without esophagitis: Secondary | ICD-10-CM

## 2014-08-11 DIAGNOSIS — R739 Hyperglycemia, unspecified: Secondary | ICD-10-CM | POA: Diagnosis not present

## 2014-08-11 DIAGNOSIS — I10 Essential (primary) hypertension: Secondary | ICD-10-CM

## 2014-08-11 LAB — CBC WITH DIFFERENTIAL/PLATELET
BASOS ABS: 0.1 10*3/uL (ref 0.0–0.1)
BASOS PCT: 0.8 % (ref 0.0–3.0)
Eosinophils Absolute: 0.6 10*3/uL (ref 0.0–0.7)
Eosinophils Relative: 6.1 % — ABNORMAL HIGH (ref 0.0–5.0)
HCT: 44.2 % (ref 36.0–46.0)
Hemoglobin: 14.8 g/dL (ref 12.0–15.0)
LYMPHS PCT: 27.8 % (ref 12.0–46.0)
Lymphs Abs: 2.6 10*3/uL (ref 0.7–4.0)
MCHC: 33.6 g/dL (ref 30.0–36.0)
MCV: 88.6 fl (ref 78.0–100.0)
Monocytes Absolute: 0.7 10*3/uL (ref 0.1–1.0)
Monocytes Relative: 7.7 % (ref 3.0–12.0)
NEUTROS ABS: 5.4 10*3/uL (ref 1.4–7.7)
Neutrophils Relative %: 57.6 % (ref 43.0–77.0)
PLATELETS: 367 10*3/uL (ref 150.0–400.0)
RBC: 4.99 Mil/uL (ref 3.87–5.11)
RDW: 14.6 % (ref 11.5–15.5)
WBC: 9.4 10*3/uL (ref 4.0–10.5)

## 2014-08-11 LAB — BASIC METABOLIC PANEL
BUN: 24 mg/dL — AB (ref 6–23)
CO2: 27 meq/L (ref 19–32)
CREATININE: 1.11 mg/dL (ref 0.40–1.20)
Calcium: 9.4 mg/dL (ref 8.4–10.5)
Chloride: 105 mEq/L (ref 96–112)
GFR: 49.93 mL/min — AB (ref >60.00)
GLUCOSE: 108 mg/dL — AB (ref 70–99)
Potassium: 4.1 mEq/L (ref 3.5–5.1)
Sodium: 138 mEq/L (ref 135–145)

## 2014-08-11 LAB — HEPATIC FUNCTION PANEL
ALBUMIN: 4 g/dL (ref 3.5–5.2)
ALK PHOS: 52 U/L (ref 39–117)
ALT: 18 U/L (ref 0–35)
AST: 19 U/L (ref 0–37)
BILIRUBIN DIRECT: 0.1 mg/dL (ref 0.0–0.3)
TOTAL PROTEIN: 7.4 g/dL (ref 6.0–8.3)
Total Bilirubin: 0.5 mg/dL (ref 0.2–1.2)

## 2014-08-11 LAB — HEMOGLOBIN A1C: HEMOGLOBIN A1C: 6.6 % — AB (ref 4.6–6.5)

## 2014-08-11 LAB — TSH: TSH: 0.84 u[IU]/mL (ref 0.35–4.50)

## 2014-08-11 MED ORDER — FLUTICASONE PROPIONATE HFA 44 MCG/ACT IN AERO: INHALATION_SPRAY | RESPIRATORY_TRACT | Status: DC

## 2014-08-11 MED ORDER — SALMETEROL XINAFOATE 50 MCG/DOSE IN AEPB
INHALATION_SPRAY | RESPIRATORY_TRACT | Status: DC
Start: 2014-08-11 — End: 2014-12-28

## 2014-08-11 MED ORDER — LISINOPRIL 40 MG PO TABS: 40.0000 mg | ORAL_TABLET | Freq: Every day | ORAL | Status: DC

## 2014-08-11 MED ORDER — RANITIDINE HCL 150 MG PO TABS: ORAL_TABLET | ORAL | Status: DC

## 2014-08-11 NOTE — Patient Instructions (Addendum)
Your next office appointment will be determined based upon review of your pending labs . Those instructions will be transmitted to you by mail Minimal Blood Pressure Goal= AVERAGE < 140/90;  Ideal is an AVERAGE < 135/85. This AVERAGE should be calculated from @ least 5-7 BP readings taken @ different times of day on different days of week. You should not respond to isolated BP readings , but rather the AVERAGE for that week .Please bring your  blood pressure cuff to office visits to verify that it is reliable.It  can also be checked against the blood pressure device at the pharmacy. Finger or wrist cuffs are not dependable; an arm cuff is.   Plain Mucinex (NOT D) for thick secretions ;force NON dairy fluids .   Nasal cleansing in the shower as discussed with lather of mild shampoo.After 10 seconds wash off lather while  exhaling through nostrils. Make sure that all residual soap is removed to prevent irritation.  Plain Allegra (NOT D )  160 daily , Loratidine 10 mg , OR Zyrtec 10 mg @ bedtime  as needed for itchy eyes & sneezing.

## 2014-08-11 NOTE — Assessment & Plan Note (Signed)
A1c

## 2014-08-11 NOTE — Progress Notes (Signed)
Subjective:    Patient ID: Alexa [name], female    DOB: [birth year], 81 y.o.   MRN: [id number]  HPI She is here to assess status of active health conditions.  PMH, FH, & Social History reviewed & updated.  She has monitored blood pressure glucoses irregularly. She does not have recorded data  She is on a decreased salt & decreased sugar dietary program When she ingests excess sugar she describes pain in her feet  She is not exercising.  Ophthalmologic exam is due.  Other than some exertional dyspnea she has no cardiopulmonary symptoms   She takes Zantac as needed; she has no active GI symptoms  The urinary frequency with increased fluids ; nocturia every 2 hours and daytime frequency were evaluated by her urologist in November. Medication was prescribed but this causes dry mouth.      Review of Systems    Chest pain, palpitations, tachycardia, exertional dyspnea, paroxysmal nocturnal dyspnea, claudication or edema are absent.  Unexplained weight loss, abdominal pain, significant dyspepsia, dysphagia, melena, rectal bleeding, or persistently small caliber stools are denied.   She does not have polydipsia or polyuria.  She has no numbness or tingling in extremities . She has no nonhealing skin lesions.  Recent rhinitis symptoms w/o purulence or fever.    Objective:   Physical Exam  Gen.: Healthy and well-nourished in appearance. Alert, appropriate and cooperative throughout exam. Appears younger than stated age  Head: Normocephalic without obvious abnormalities  Eyes: No corneal or conjunctival inflammation noted. Pupils equal round reactive to light and accommodation. Extraocular motion intact.  Vision grossly normal with /w/o lenses Ears: External  ear exam reveals no significant lesions or deformities. Canals clear .TMs normal. Hearing is grossly normal bilaterally. Nose: External nasal exam reveals no deformity or inflammation. Nasal mucosa are pink and moist. No  lesions or exudates noted.   Mouth: Oral mucosa and oropharynx reveal no lesions or exudates. Teeth in good repair. Upper partial. Neck: No deformities, masses, or tenderness noted. Range of motion decreased. Thyroid normal Lungs: Normal respiratory effort; chest expands symmetrically. Lungs are clear to auscultation without rales, wheezes, or increased work of breathing. Heart: Normal rate and rhythm. Normal S1 and S2. No gallop, click, or rub. No murmur. Abdomen: Bowel sounds normal; abdomen soft and nontender. No masses, organomegaly or hernias noted. Genitalia:  as per Gyn                                  Musculoskeletal/extremities:Slightly accentuated curvature of upper thoracic spine. No clubbing, cyanosis, or edema noted.  Tone & strength normal. 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  Fingernail  health good. Crepitus of knees  Able to lie down & sit up w/o help.  Negative SLR bilaterally Vascular: Carotid, radial artery, dorsalis pedis and  posterior tibial pulses are full and equal. No bruits present. Neurologic: Alert and oriented x3. Deep tendon reflexes symmetrical and normal.  Gait normal      Skin: Intact without suspicious lesions or rashes.Large epidermoid inclusion cyst mid back. Lymph: No cervical, axillary lymphadenopathy present. Psych: Mood and affect are normal. Normally interactive  Assessment & Plan:  See Current Assessment & Plan in Problem List under specific Diagnosis

## 2014-08-11 NOTE — Assessment & Plan Note (Signed)
CBC & dif  Anti reflux measures Zantac prn

## 2014-08-11 NOTE — Assessment & Plan Note (Signed)
TSH 

## 2014-08-11 NOTE — Progress Notes (Signed)
Pre visit review using our clinic review tool, if applicable. No additional management support is needed unless otherwise documented below in the visit note. 

## 2014-08-11 NOTE — Assessment & Plan Note (Signed)
Blood pressure goals reviewed. BMET 

## 2014-08-12 ENCOUNTER — Other Ambulatory Visit: Payer: Self-pay | Admitting: Internal Medicine

## 2014-08-17 ENCOUNTER — Telehealth: Payer: Self-pay | Admitting: Internal Medicine

## 2014-08-17 MED ORDER — TRAMADOL HCL 50 MG PO TABS: ORAL_TABLET | ORAL | Status: DC

## 2014-08-17 NOTE — Telephone Encounter (Signed)
Pt called in need refill on her traMADol (ULTRAM) 50 MG tablet [[id number]] and Wellbutrin

## 2014-08-17 NOTE — Telephone Encounter (Signed)
wellbutrin was already sent electronically to the [facility] on 08/12/14  Tramadol has been called to [facility] today

## 2014-08-17 NOTE — Telephone Encounter (Signed)
OK X1 

## 2014-09-20 ENCOUNTER — Telehealth: Payer: Self-pay | Admitting: Internal Medicine

## 2014-09-20 NOTE — Telephone Encounter (Signed)
Please call and schedule appt for pt

## 2014-09-20 NOTE — Telephone Encounter (Signed)
Not maintenance; for acute infectious symptoms only Would require OV for refill

## 2014-09-20 NOTE — Telephone Encounter (Signed)
cefUROXime (CEFTIN) 500 MG tablet [[id number]]  Patient requesting refill sent to [facility] on [address]

## 2014-09-22 ENCOUNTER — Telehealth: Payer: Self-pay | Admitting: *Deleted

## 2014-09-22 MED ORDER — TRAMADOL HCL 50 MG PO TABS: ORAL_TABLET | ORAL | Status: DC

## 2014-09-22 NOTE — Telephone Encounter (Signed)
Left msg on triage requesting refill on her tramadol.../lmb 

## 2014-09-22 NOTE — Telephone Encounter (Signed)
OK X1 

## 2014-09-22 NOTE — Telephone Encounter (Signed)
Notified pt rx fax to [facility]...Johny Chess

## 2014-10-03 ENCOUNTER — Other Ambulatory Visit (INDEPENDENT_AMBULATORY_CARE_PROVIDER_SITE_OTHER): Payer: Commercial Managed Care - HMO

## 2014-10-03 ENCOUNTER — Ambulatory Visit (INDEPENDENT_AMBULATORY_CARE_PROVIDER_SITE_OTHER): Payer: Commercial Managed Care - HMO | Admitting: Internal Medicine

## 2014-10-03 ENCOUNTER — Ambulatory Visit (INDEPENDENT_AMBULATORY_CARE_PROVIDER_SITE_OTHER)
Admission: RE | Admit: 2014-10-03 | Discharge: 2014-10-03 | Disposition: A | Discharge status: Home / Self Care | Payer: Commercial Managed Care - HMO | Source: Ambulatory Visit | Attending: Internal Medicine | Admitting: Internal Medicine

## 2014-10-03 ENCOUNTER — Encounter: Payer: Self-pay | Admitting: Internal Medicine

## 2014-10-03 VITALS — BP 158/90 | HR 88 | Temp 97.8°F | Ht 61.25 in | Wt 140.2 lb

## 2014-10-03 DIAGNOSIS — R05 Cough: Secondary | ICD-10-CM

## 2014-10-03 DIAGNOSIS — J3089 Other allergic rhinitis: Secondary | ICD-10-CM

## 2014-10-03 DIAGNOSIS — J449 Chronic obstructive pulmonary disease, unspecified: Secondary | ICD-10-CM | POA: Diagnosis not present

## 2014-10-03 DIAGNOSIS — J3489 Other specified disorders of nose and nasal sinuses: Secondary | ICD-10-CM

## 2014-10-03 DIAGNOSIS — R059 Cough, unspecified: Secondary | ICD-10-CM

## 2014-10-03 DIAGNOSIS — R51 Headache: Secondary | ICD-10-CM | POA: Diagnosis not present

## 2014-10-03 LAB — CBC WITH DIFFERENTIAL/PLATELET
BASOS ABS: 0.1 10*3/uL (ref 0.0–0.1)
Basophils Relative: 0.8 % (ref 0.0–3.0)
Eosinophils Absolute: 0.8 10*3/uL — ABNORMAL HIGH (ref 0.0–0.7)
Eosinophils Relative: 8.2 % — ABNORMAL HIGH (ref 0.0–5.0)
HCT: 44.2 % (ref 36.0–46.0)
Hemoglobin: 15.1 g/dL — ABNORMAL HIGH (ref 12.0–15.0)
Lymphocytes Relative: 29.7 % (ref 12.0–46.0)
Lymphs Abs: 2.9 10*3/uL (ref 0.7–4.0)
MCHC: 34.2 g/dL (ref 30.0–36.0)
MCV: 86.3 fl (ref 78.0–100.0)
Monocytes Absolute: 0.7 10*3/uL (ref 0.1–1.0)
Monocytes Relative: 7.4 % (ref 3.0–12.0)
NEUTROS PCT: 53.9 % (ref 43.0–77.0)
Neutro Abs: 5.2 10*3/uL (ref 1.4–7.7)
Platelets: 350 10*3/uL (ref 150.0–400.0)
RBC: 5.11 Mil/uL (ref 3.87–5.11)
RDW: 14 % (ref 11.5–15.5)
WBC: 9.6 10*3/uL (ref 4.0–10.5)

## 2014-10-03 MED ORDER — MONTELUKAST SODIUM 10 MG PO TABS: 10.0000 mg | ORAL_TABLET | Freq: Every day | ORAL | Status: DC

## 2014-10-03 NOTE — Progress Notes (Signed)
Pre visit review using our clinic review tool, if applicable. No additional management support is needed unless otherwise documented below in the visit note. 

## 2014-10-03 NOTE — Progress Notes (Signed)
   Subjective:    Patient ID: Alexa [name], female    DOB: 05/28/1930, 81 y.o.   MRN: [id number]  HPI  She describes cough for last 1.5 years. This occurs daily & is productive of yellow-dark gray sputum. At this time the sputum is now white. The cough does increase through the day. The cough can be exacerbated by laughing. She has associated sweats, sneezing, and watery eyes. She describes frontal and facial pain. She attributes shortness of breath to deconditioning. She stopped her Serevent thinking it might be causing her cough. She is on ACE inhibitor as well.  She plans a trip to [place] next week.  Review of Systems She denies fever, chills, or wheezing. She has no nasal purulence ; all nasal secretions are white.    Objective:   Physical Exam  Pertinent or positive findings include: She has an upper partial.  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  Sh has a large epidermoid inclusion cyst over the mid upper back.  General appearance:Adequately nourished; no acute distress or increased work of breathing is present.  No  lymphadenopathy about the head, neck, or axilla noted.  Eyes: No conjunctival inflammation or lid edema is present. There is no scleral icterus. Ears:  External ear exam shows no significant lesions or deformities.  Otoscopic examination reveals clear canals, tympanic membranes are intact bilaterally without bulging, retraction, inflammation or discharge. Nose:  External nasal examination shows no deformity or inflammation. Nasal mucosa are pink and moist without lesions or exudates. No septal dislocation or deviation.No obstruction to airflow.  Oral exam: Dental hygiene is good; lips and gums are healthy appearing.There is no oropharyngeal erythema or exudate noted.  Neck:  No deformities, thyromegaly, masses, or tenderness noted.    Heart:  Normal rate and regular rhythm. S1 and S2 normal without gallop, murmur, click, rub or other extra  sounds.  Lungs:Chest clear to auscultation; no wheezes, rhonchi,rales ,or rubs present. Extremities:  No cyanosis, edema, or clubbing  noted  Skin: Warm & dry w/o jaundice or tenting.      Assessment & Plan:  #1 cough , perennial  #2 allergic rhinitis  Plan: See orders and recommendations

## 2014-10-03 NOTE — Patient Instructions (Signed)

## 2014-11-22 ENCOUNTER — Emergency Department (HOSPITAL_COMMUNITY): Payer: Commercial Managed Care - HMO

## 2014-11-22 ENCOUNTER — Encounter (HOSPITAL_COMMUNITY): Payer: Self-pay | Admitting: Emergency Medicine

## 2014-11-22 ENCOUNTER — Inpatient Hospital Stay (HOSPITAL_COMMUNITY)
Admission: EM | Admit: 2014-11-22 | Discharge: 2014-11-23 | DRG: 313 | Disposition: A | Discharge status: Home / Self Care | Payer: Commercial Managed Care - HMO | Attending: Internal Medicine | Admitting: Internal Medicine

## 2014-11-22 DIAGNOSIS — J440 Chronic obstructive pulmonary disease with acute lower respiratory infection: Secondary | ICD-10-CM | POA: Diagnosis not present

## 2014-11-22 DIAGNOSIS — R072 Precordial pain: Secondary | ICD-10-CM | POA: Diagnosis not present

## 2014-11-22 DIAGNOSIS — Z86718 Personal history of other venous thrombosis and embolism: Secondary | ICD-10-CM

## 2014-11-22 DIAGNOSIS — I129 Hypertensive chronic kidney disease with stage 1 through stage 4 chronic kidney disease, or unspecified chronic kidney disease: Secondary | ICD-10-CM | POA: Diagnosis present

## 2014-11-22 DIAGNOSIS — N183 Chronic kidney disease, stage 3 (moderate): Secondary | ICD-10-CM | POA: Diagnosis not present

## 2014-11-22 DIAGNOSIS — Z7982 Long term (current) use of aspirin: Secondary | ICD-10-CM

## 2014-11-22 DIAGNOSIS — E119 Type 2 diabetes mellitus without complications: Secondary | ICD-10-CM | POA: Diagnosis not present

## 2014-11-22 DIAGNOSIS — K219 Gastro-esophageal reflux disease without esophagitis: Secondary | ICD-10-CM | POA: Diagnosis present

## 2014-11-22 DIAGNOSIS — J209 Acute bronchitis, unspecified: Secondary | ICD-10-CM | POA: Diagnosis not present

## 2014-11-22 DIAGNOSIS — R0789 Other chest pain: Principal | ICD-10-CM | POA: Diagnosis present

## 2014-11-22 DIAGNOSIS — Z85528 Personal history of other malignant neoplasm of kidney: Secondary | ICD-10-CM

## 2014-11-22 DIAGNOSIS — R079 Chest pain, unspecified: Secondary | ICD-10-CM | POA: Diagnosis not present

## 2014-11-22 DIAGNOSIS — J45909 Unspecified asthma, uncomplicated: Secondary | ICD-10-CM | POA: Diagnosis not present

## 2014-11-22 DIAGNOSIS — J208 Acute bronchitis due to other specified organisms: Secondary | ICD-10-CM

## 2014-11-22 DIAGNOSIS — R300 Dysuria: Secondary | ICD-10-CM | POA: Diagnosis present

## 2014-11-22 DIAGNOSIS — J40 Bronchitis, not specified as acute or chronic: Secondary | ICD-10-CM | POA: Diagnosis not present

## 2014-11-22 HISTORY — DX: Unspecified asthma, uncomplicated: J45.909

## 2014-11-22 LAB — CBC WITH DIFFERENTIAL/PLATELET
Basophils Absolute: 0.1 10*3/uL (ref 0.0–0.1)
Basophils Relative: 2 % — ABNORMAL HIGH (ref 0–1)
Eosinophils Absolute: 0.9 10*3/uL — ABNORMAL HIGH (ref 0.0–0.7)
Eosinophils Relative: 11 % — ABNORMAL HIGH (ref 0–5)
HCT: 44.3 % (ref 36.0–46.0)
Hemoglobin: 15 g/dL (ref 12.0–15.0)
LYMPHS ABS: 2 10*3/uL (ref 0.7–4.0)
LYMPHS PCT: 25 % (ref 12–46)
MCH: 29.4 pg (ref 26.0–34.0)
MCHC: 33.9 g/dL (ref 30.0–36.0)
MCV: 86.9 fL (ref 78.0–100.0)
MONO ABS: 0.6 10*3/uL (ref 0.1–1.0)
Monocytes Relative: 8 % (ref 3–12)
Neutro Abs: 4.2 10*3/uL (ref 1.7–7.7)
Neutrophils Relative %: 54 % (ref 43–77)
PLATELETS: 302 10*3/uL (ref 150–400)
RBC: 5.1 MIL/uL (ref 3.87–5.11)
RDW: 14 % (ref 11.5–15.5)
WBC: 7.8 10*3/uL (ref 4.0–10.5)

## 2014-11-22 LAB — URINALYSIS, ROUTINE W REFLEX MICROSCOPIC
BILIRUBIN URINE: NEGATIVE
Glucose, UA: NEGATIVE mg/dL
HGB URINE DIPSTICK: NEGATIVE
KETONES UR: NEGATIVE mg/dL
Nitrite: NEGATIVE
Protein, ur: NEGATIVE mg/dL
Specific Gravity, Urine: 1.029 (ref 1.005–1.030)
UROBILINOGEN UA: 0.2 mg/dL (ref 0.0–1.0)
pH: 5 (ref 5.0–8.0)

## 2014-11-22 LAB — TSH: TSH: 1.033 u[IU]/mL (ref 0.350–4.500)

## 2014-11-22 LAB — COMPREHENSIVE METABOLIC PANEL
ALK PHOS: 55 U/L (ref 39–117)
ALT: 24 U/L (ref 0–35)
AST: 26 U/L (ref 0–37)
Albumin: 3.8 g/dL (ref 3.5–5.2)
Anion gap: 11 (ref 5–15)
BILIRUBIN TOTAL: 0.9 mg/dL (ref 0.3–1.2)
BUN: 18 mg/dL (ref 6–23)
CO2: 22 mmol/L (ref 19–32)
CREATININE: 1.18 mg/dL — AB (ref 0.50–1.10)
Calcium: 9.3 mg/dL (ref 8.4–10.5)
Chloride: 107 mmol/L (ref 96–112)
GFR calc non Af Amer: 41 mL/min — ABNORMAL LOW (ref >90)
GFR, EST AFRICAN AMERICAN: 48 mL/min — AB (ref >90)
Glucose, Bld: 112 mg/dL — ABNORMAL HIGH (ref 70–99)
POTASSIUM: 4.2 mmol/L (ref 3.5–5.1)
SODIUM: 140 mmol/L (ref 135–145)
Total Protein: 7 g/dL (ref 6.0–8.3)

## 2014-11-22 LAB — URINE MICROSCOPIC-ADD ON

## 2014-11-22 LAB — TROPONIN I: Troponin I: <0.03 ng/mL (ref <0.031)

## 2014-11-22 LAB — D-DIMER, QUANTITATIVE: D-Dimer, Quant: 0.97 ug/mL-FEU — ABNORMAL HIGH (ref 0.00–0.48)

## 2014-11-22 LAB — I-STAT TROPONIN, ED
TROPONIN I, POC: 0 ng/mL (ref 0.00–0.08)
TROPONIN I, POC: 0.01 ng/mL (ref 0.00–0.08)

## 2014-11-22 MED ORDER — MORPHINE SULFATE 2 MG/ML IJ SOLN
1.0000 mg | Freq: Once | INTRAMUSCULAR | Status: AC
  Administered 2014-11-22: 1 mg via INTRAVENOUS
  Filled 2014-11-22: qty 1

## 2014-11-22 MED ORDER — GUAIFENESIN ER 600 MG PO TB12
1200.0000 mg | ORAL_TABLET | Freq: Two times a day (BID) | ORAL | Status: DC
Start: 2014-11-22 — End: 2014-11-23
  Administered 2014-11-22 – 2014-11-23 (×3): 1200 mg via ORAL
  Filled 2014-11-22 (×3): qty 2

## 2014-11-22 MED ORDER — ENOXAPARIN SODIUM 40 MG/0.4ML ~~LOC~~ SOLN
40.0000 mg | SUBCUTANEOUS | Status: DC
Start: 2014-11-22 — End: 2014-11-23
  Administered 2014-11-22: 40 mg via SUBCUTANEOUS
  Filled 2014-11-22: qty 0.4

## 2014-11-22 MED ORDER — ACETAMINOPHEN 650 MG RE SUPP: 650.0000 mg | Freq: Four times a day (QID) | RECTAL | Status: DC | PRN

## 2014-11-22 MED ORDER — OXYCODONE HCL 5 MG PO TABS: 5.0000 mg | ORAL_TABLET | ORAL | Status: DC | PRN

## 2014-11-22 MED ORDER — LISINOPRIL 40 MG PO TABS
40.0000 mg | ORAL_TABLET | Freq: Every day | ORAL | Status: DC
  Administered 2014-11-23: 40 mg via ORAL
  Filled 2014-11-22: qty 1

## 2014-11-22 MED ORDER — ONDANSETRON HCL 4 MG/2ML IJ SOLN: 4.0000 mg | Freq: Four times a day (QID) | INTRAMUSCULAR | Status: DC | PRN

## 2014-11-22 MED ORDER — ACETAMINOPHEN ER 650 MG PO TBCR: 650.0000 mg | EXTENDED_RELEASE_TABLET | Freq: Three times a day (TID) | ORAL | Status: DC | PRN

## 2014-11-22 MED ORDER — FLUTICASONE PROPIONATE HFA 44 MCG/ACT IN AERO
1.0000 | INHALATION_SPRAY | Freq: Two times a day (BID) | RESPIRATORY_TRACT | Status: DC
  Filled 2014-11-22 (×2): qty 10.6

## 2014-11-22 MED ORDER — FAMOTIDINE 20 MG PO TABS
20.0000 mg | ORAL_TABLET | Freq: Every day | ORAL | Status: DC
  Administered 2014-11-23: 20 mg via ORAL
  Filled 2014-11-22: qty 1

## 2014-11-22 MED ORDER — TROLAMINE SALICYLATE 10 % EX CREA: TOPICAL_CREAM | Freq: Two times a day (BID) | CUTANEOUS | Status: DC | PRN

## 2014-11-22 MED ORDER — ONDANSETRON HCL 4 MG PO TABS: 4.0000 mg | ORAL_TABLET | Freq: Four times a day (QID) | ORAL | Status: DC | PRN

## 2014-11-22 MED ORDER — DEXTROMETHORPHAN POLISTIREX ER 30 MG/5ML PO SUER
30.0000 mg | Freq: Two times a day (BID) | ORAL | Status: DC | PRN
  Filled 2014-11-22: qty 5

## 2014-11-22 MED ORDER — TRAMADOL HCL 50 MG PO TABS: 50.0000 mg | ORAL_TABLET | Freq: Four times a day (QID) | ORAL | Status: DC | PRN

## 2014-11-22 MED ORDER — SODIUM CHLORIDE 0.9 % IJ SOLN
3.0000 mL | Freq: Two times a day (BID) | INTRAMUSCULAR | Status: DC
  Administered 2014-11-22: 3 mL via INTRAVENOUS

## 2014-11-22 MED ORDER — ACETAMINOPHEN 325 MG PO TABS: 650.0000 mg | ORAL_TABLET | Freq: Four times a day (QID) | ORAL | Status: DC | PRN

## 2014-11-22 MED ORDER — SODIUM CHLORIDE 0.9 % IV SOLN: INTRAVENOUS | Status: DC

## 2014-11-22 MED ORDER — ASPIRIN EC 81 MG PO TBEC
81.0000 mg | DELAYED_RELEASE_TABLET | Freq: Every day | ORAL | Status: DC
  Administered 2014-11-22 – 2014-11-23 (×2): 81 mg via ORAL
  Filled 2014-11-22 (×2): qty 1

## 2014-11-22 MED ORDER — SODIUM CHLORIDE 0.9 % IV SOLN
INTRAVENOUS | Status: DC
  Administered 2014-11-22: 10:00:00 via INTRAVENOUS

## 2014-11-22 MED ORDER — LORATADINE 10 MG PO TABS
10.0000 mg | ORAL_TABLET | Freq: Every day | ORAL | Status: DC
  Administered 2014-11-22 – 2014-11-23 (×2): 10 mg via ORAL
  Filled 2014-11-22 (×2): qty 1

## 2014-11-22 MED ORDER — IPRATROPIUM-ALBUTEROL 0.5-2.5 (3) MG/3ML IN SOLN
3.0000 mL | Freq: Four times a day (QID) | RESPIRATORY_TRACT | Status: DC
  Administered 2014-11-23: 3 mL via RESPIRATORY_TRACT
  Filled 2014-11-22 (×2): qty 3

## 2014-11-22 MED ORDER — DEXTROSE 5 % IV SOLN
500.0000 mg | INTRAVENOUS | Status: DC
  Administered 2014-11-22: 500 mg via INTRAVENOUS
  Filled 2014-11-22 (×2): qty 500

## 2014-11-22 MED ORDER — PNEUMOCOCCAL VAC POLYVALENT 25 MCG/0.5ML IJ INJ
0.5000 mL | INJECTION | INTRAMUSCULAR | Status: AC
  Administered 2014-11-23: 0.5 mL via INTRAMUSCULAR
  Filled 2014-11-22: qty 0.5

## 2014-11-22 MED ORDER — SODIUM CHLORIDE 0.9 % IV SOLN
INTRAVENOUS | Status: AC
  Administered 2014-11-22: 16:00:00 via INTRAVENOUS

## 2014-11-22 MED ORDER — MORPHINE SULFATE 2 MG/ML IJ SOLN: 1.0000 mg | INTRAMUSCULAR | Status: DC | PRN

## 2014-11-22 MED ORDER — ALUM & MAG HYDROXIDE-SIMETH 200-200-20 MG/5ML PO SUSP: 30.0000 mL | Freq: Four times a day (QID) | ORAL | Status: DC | PRN

## 2014-11-22 MED ORDER — ONDANSETRON HCL 4 MG/2ML IJ SOLN
4.0000 mg | Freq: Once | INTRAMUSCULAR | Status: AC
  Administered 2014-11-22: 4 mg via INTRAVENOUS
  Filled 2014-11-22: qty 2

## 2014-11-22 MED ORDER — MUSCLE RUB 10-15 % EX CREA
TOPICAL_CREAM | Freq: Two times a day (BID) | CUTANEOUS | Status: DC | PRN
  Administered 2014-11-22: 21:00:00 via TOPICAL
  Filled 2014-11-22: qty 85

## 2014-11-22 MED ORDER — IOHEXOL 350 MG/ML SOLN
80.0000 mL | Freq: Once | INTRAVENOUS | Status: AC | PRN
  Administered 2014-11-22: 80 mL via INTRAVENOUS

## 2014-11-22 NOTE — ED Provider Notes (Signed)
CSN: [id number]     Arrival date & time 11/22/14  0865 History   First MD Initiated Contact with Patient 11/22/14 [phone]     Chief Complaint  Patient presents with  . Chest Pain  . Arm Pain     (Consider location/radiation/quality/duration/timing/severity/associated sxs/prior Treatment) Patient is a 81 y.o. female presenting with chest pain and arm pain. The history is provided by the patient.  Chest Pain Associated symptoms: cough and diaphoresis   Associated symptoms: no abdominal pain, no back pain, no fever, no headache, no nausea, no shortness of breath and not vomiting   Arm Pain Associated symptoms include chest pain. Pertinent negatives include no abdominal pain, no headaches and no shortness of breath.   patient with onset of right sided chest pain moving to right shoulder and right arm yesterday. Patient was working in her garden but the pain came on later. Pain is not made worse by movement of the right arm. Patient is concerned it could be related to her heart. Pain has persisted waxed and waned but never gone completely away currently the pain is 5 out of 10 at its worse it was 7 out of 10. Patient tried some of her husband's nitroglycerin without any relief did have a full aspirin this morning. Was associated with some diaphoresis last evening no nausea no shortness of breath no fever. Patient does have COPD and frequently has a little bit of a productive cough no real change in baseline from that. Patient also had a history of deep vein thrombosis in the past currently not on blood thinners because she had problem with a GI bleed.  Past Medical History  Diagnosis Date  . Chronic obstructive pulmonary disease   . Personal history of venous thrombosis and embolism     PTE after nephrectomy  . Esophageal reflux   . Irritable bowel syndrome   . Unspecified essential hypertension   . Other and unspecified hyperlipidemia   . Diverticulosis of colon (without mention of hemorrhage)    . Spinal stenosis   . History of colonic polyps 06/05/05    hyperplastic  . Diverticulosis   . Internal hemorrhoids   . Type II or unspecified type diabetes mellitus without mention of complication, not stated as uncontrolled 02/05/2012    pt states it was in 2007  . Ischemic colitis   . Asthma   . Malignant neoplasm of kidney and other and unspecified urinary organs     renal carcinoma, left nephrectomy in 2008-per patient.   Past Surgical History  Procedure Laterality Date  . Septoplasty    . Tonsillectomy    . Breast biopsy    . Colonoscopy w/ polypectomy  2000  . Colonoscopy w/ polypectomy  07/2004    Hyperplastic polyps  . Dilation and curettage of uterus  03/2005    Uterine Polyps  . Nephrectomy  2007    right,Dr Dahlstedt   Family History  Problem Relation Age of Onset  . Stroke Mother     onset in 54s  . Diabetes Mother   . Cancer Mother     bladder; nephrectomy for calculi/also uterine , TAH & BSO  . Aneurysm Mother     thoracic  . Depression Mother   . Stroke Brother   . Heart failure Brother   . Heart attack Other     maternal family history  . Heart failure Maternal Grandfather   . Lung cancer Father   . Heart failure Sister   . Depression Sister   .  Alzheimer's disease Sister   . COPD Sister   . Aneurysm Brother     cns  . Depression Maternal Aunt     X2  . Bipolar disorder Sister   . Alcoholism Neg Hx    History  Substance Use Topics  . Smoking status: Never Smoker   . Smokeless tobacco: Never Used  . Alcohol Use: No   OB History    No data available     Review of Systems  Constitutional: Positive for diaphoresis. Negative for fever.  HENT: Negative for congestion.   Eyes: Negative for visual disturbance.  Respiratory: Positive for cough. Negative for shortness of breath.   Cardiovascular: Positive for chest pain.  Gastrointestinal: Negative for nausea, vomiting and abdominal pain.  Genitourinary: Negative for dysuria.   Musculoskeletal: Negative for back pain.  Skin: Negative for rash.  Neurological: Negative for headaches.  Hematological: Does not bruise/bleed easily.  Psychiatric/Behavioral: Negative for confusion.      Allergies  Codeine and Verapamil  Home Medications   Prior to Admission medications   Medication Sig Start Date End Date Taking? Authorizing Provider  acetaminophen (TYLENOL) 650 MG CR tablet Take 650 mg by mouth every 8 (eight) hours as needed. For pain   Yes Historical Provider, MD  aspirin EC 81 MG tablet Take 81-162 mg by mouth daily.    Yes Historical Provider, MD  Benzalkonium Chloride (DIABETIC BASICS HEALTHY FOOT EX) Apply 1 application topically daily as needed (foot pain). OTC diabetic foot cream   Yes Historical Provider, MD  Blood Glucose Monitoring Suppl (TRUERESULT BLOOD GLUCOSE) W/DEVICE KIT Use to test blood sugar ICD 10 E11 9 07/14/14  Yes Hendricks Limes, MD  Cholecalciferol (VITAMIN D PO) Take 1 tablet by mouth daily.   Yes Historical Provider, MD  CRANBERRY PO Take 2 tablets by mouth daily.   Yes Historical Provider, MD  fluticasone (FLOVENT HFA) 44 MCG/ACT inhaler inhale 1 to 2 puffs by mouth twice a day if needed 08/11/14  Yes Hendricks Limes, MD  glucose blood (TRUETEST TEST) test strip Use to test blood sugar ICD 10 E11 9 07/14/14  Yes Hendricks Limes, MD  lisinopril (PRINIVIL,ZESTRIL) 40 MG tablet Take 1 tablet (40 mg total) by mouth daily. 08/11/14  Yes Hendricks Limes, MD  loratadine (CLARITIN) 10 MG tablet Take 10 mg by mouth daily.   Yes Historical Provider, MD  NASAL SPRAY SALINE NA Place 1-2 sprays into both nostrils daily.   Yes Historical Provider, MD  ranitidine (ZANTAC) 150 MG tablet take 1 tablet by mouth every 12 hours if needed for REFLUX 08/11/14  Yes Hendricks Limes, MD  salmeterol (SEREVENT DISKUS) 50 MCG/DOSE diskus inhaler inhale 1 puff orally twice a day 08/11/14  Yes Hendricks Limes, MD  traMADol (ULTRAM) 50 MG tablet TAKE 1/2 TO 1  TABLET BY MOUTH EVERY 8 HOURS IF NEEDED. 09/22/14  Yes Hendricks Limes, MD  TRUEPLUS LANCETS 28G MISC Use to test blood sugar ICD 10 E11 9 07/14/14  Yes Hendricks Limes, MD  vitamin C (ASCORBIC ACID) 500 MG tablet Take 500 mg by mouth daily.   Yes Historical Provider, MD  buPROPion (WELLBUTRIN) 75 MG tablet take 1 tablet by mouth twice a day Patient not taking: Reported on 11/22/2014 08/12/14   Hendricks Limes, MD  montelukast (SINGULAIR) 10 MG tablet Take 1 tablet (10 mg total) by mouth daily. Patient not taking: Reported on 11/22/2014 10/03/14   Hendricks Limes, MD  [facility]  108 (90 BASE) MCG/ACT inhaler use as directed Patient not taking: Reported on 11/22/2014 06/06/14   Hendricks Limes, MD   BP 109/65 mmHg  Pulse 77  Temp(Src) 97.5 F (36.4 C) (Oral)  Resp 16  SpO2 96% Physical Exam  Constitutional: She is oriented to person, place, and time. She appears well-developed and well-nourished. No distress.  HENT:  Head: Normocephalic and atraumatic.  Mouth/Throat: Oropharynx is clear and moist.  Eyes: Conjunctivae and EOM are normal. Pupils are equal, round, and reactive to light.  Neck: Normal range of motion. Neck supple.  Cardiovascular: Normal rate, regular rhythm and normal heart sounds.   No murmur heard. Pulmonary/Chest: Effort normal and breath sounds normal. No respiratory distress.  Abdominal: Soft. Bowel sounds are normal. She exhibits no distension.  Musculoskeletal: Normal range of motion. She exhibits no edema.  Neurological: She is alert and oriented to person, place, and time. No cranial nerve deficit. She exhibits normal muscle tone. Coordination normal.  Skin: Skin is warm. No rash noted.  Nursing note and vitals reviewed.   ED Course  Procedures (including critical care time) Labs Review Labs Reviewed  CBC WITH DIFFERENTIAL/PLATELET - Abnormal; Notable for the following:    Eosinophils Relative 11 (*)    Eosinophils Absolute 0.9 (*)    Basophils Relative 2  (*)    All other components within normal limits  COMPREHENSIVE METABOLIC PANEL - Abnormal; Notable for the following:    Glucose, Bld 112 (*)    Creatinine, Ser 1.18 (*)    GFR calc non Af Amer 41 (*)    GFR calc Af Amer 48 (*)    All other components within normal limits  D-DIMER, QUANTITATIVE - Abnormal; Notable for the following:    D-Dimer, Quant 0.97 (*)    All other components within normal limits  I-STAT TROPOININ, ED  I-STAT TROPOININ, ED    Imaging Review Ct Angio Chest Pe W/cm &/or Wo Cm  11/22/2014   CLINICAL DATA:  Right arm pain into the chest for 2 days. History of renal cell carcinoma and DVT/PE  EXAM: CT ANGIOGRAPHY CHEST WITH CONTRAST  TECHNIQUE: Multidetector CT imaging of the chest was performed using the standard protocol during bolus administration of intravenous contrast. Multiplanar CT image reconstructions and MIPs were obtained to evaluate the vascular anatomy.  CONTRAST:  79m OMNIPAQUE IOHEXOL 350 MG/ML SOLN  COMPARISON:  02/21/2010  FINDINGS: THORACIC INLET/BODY WALL:  Dermal inclusion cyst in the midline upper back measuring 26 mm, increased from 2011.  No acute finding.  MEDIASTINUM:  Normal heart size. No pericardial effusion. Limited opacification of the aorta and great vessels, but no evidence of acute aortic disease. Thickening of the descending thoracic wall is stable from 2011 and atherosclerotic. No evidence of pulmonary embolism. No lymphadenopathy. Negative esophagus  LUNG WINDOWS:  Bronchial wall thickening, mainly in the lower lobes, with intermittent mucoid impaction in the right lower lobe. No consolidation, edema, effusion, or pneumothorax. No suspicious pulmonary nodules.  UPPER ABDOMEN:  No acute findings.  OSSEOUS:  No acute fracture.  No suspicious lytic or blastic lesions.  Review of the MIP images confirms the above findings.  IMPRESSION: 1. No evidence of pulmonary embolism. 2. Mild bronchitis.   Electronically Signed   By: JMonte FantasiaM.D.    On: 11/22/2014 12:53   Dg Chest Port 1 View  11/22/2014   CLINICAL DATA:  Right chest pain starting yesterday morning  EXAM: PORTABLE CHEST - 1 VIEW  COMPARISON:  10/03/2014  FINDINGS: Normal heart size for technique. There is stable aortic tortuosity. Chronic mild reticular markings in the lower lungs with slight increase in density at the left base compared to prior. There is no edema, effusion, or pneumothorax.  IMPRESSION: Probable mild atelectasis at the left base. No edema or suspected pneumonia.   Electronically Signed   By: Monte Fantasia M.D.   On: 11/22/2014 09:49     EKG Interpretation   Date/Time:  Tuesday November 22 2014 08:59:46 EDT Ventricular Rate:  78 PR Interval:  194 QRS Duration: 70 QT Interval:  384 QTC Calculation: 437 R Axis:   -47 Text Interpretation:  Normal sinus rhythm with sinus arrhythmia Left axis  deviation Anterior infarct , age undetermined Abnormal ECG No significant  change since last tracing Confirmed by Delta Pichon  MD, Kayron Kalmar [phone]) on  11/22/2014 9:07:25 AM      MDM   Final diagnoses:  Chest pain    Patient with onset of right-sided chest shoulder arm pain yesterday. Currently 5 out of 10 at worse it was 7 out of 10. No nausea and no shortness of breath did have diaphoresis though no fevers does have a history of COPD and normally has a baseline productive cough. Pain is not made worse by moving her right arm. The pain as 10 more or less constant but does wax and wane. Patient did try some of her husband's nitroglycerin without any help. Also did take a full aspirin this morning.  Workup negative for any acute cardiac findings on EKG or based on the initial troponin. Chest x-ray was negative CT angios was done because she's had a past history of DVTs and that was negative. No evidence of pulmonary embolus. Patient will require admission for rule out since she had chest pain still upon arrival here it did go away with the IV morphine she has had one  brief recurrence of the chest pain since the IV morphine but is currently pain-free.    Fredia Sorrow, MD 11/22/14 1320

## 2014-11-22 NOTE — H&P (Signed)
[facility] History and Physical  Alexa [name] QQP:[id number] DOB: 1929/12/10 DOA: 11/22/2014  Referring physician: Fredia Sorrow, MD PCP: Unice Cobble, MD   Chief Complaint: chest pain   HPI: Alexa [name] is a 81 y.o. female with PMH of COPD, venous thrombosis, asthma, diverticulosis, cancer(kidney removed) ,and ischemic colitis.  Patient reports that yesterday(11/21/14) she was working in her garden, and later that day pain developed on the right side of her chest.  It radiates into her right arm and shoulder.  The patient is able to be reproduced by moving in bed, and by applying resistance to her shoulder.  Patient was concerned that it was a cardiac event and took some of her husbands nitroglycerin with no relief.  She also took aspirin before coming to the ER with no relief from pain.  She reported to ER staff that pain has waxed and waned since yesterday at approximately a 5 out of 10.  Morphine was given in the ER, and her pain resolved.  She is concerned about her family history of numerous aneurysms and MIs.  Her brother passed at 67 with an MI.  She does complain of having a cold for the past two years with allergy symptoms.  She has a productive cough at times.    In the ER(11/22/14) there was no acute cardiac findings on EKG.  Her troponin was not elevated.  A CTA was done due to her history of DVT and no evidence of a PE was found, however, she does have mucoid impaction of the right lower lobe.     Review of Systems:  Constitutional:  No weight loss, night sweats, Fevers, chills, fatigue.  HEENT:  No headaches, Difficulty swallowing, Sore throat,  No sneezing, itching, ear ache. Positive for nasal congestion, post nasal drip Cardio-vascular:  Positive for chest pain radiating into right arm, diaphoresis.  No Orthopnea, PND, swelling in lower extremities, anasarca, dizziness, palpitations  GI:  No heartburn, indigestion, abdominal pain, nausea, vomiting, diarrhea,  change in bowel habits, loss of appetite  Resp:  Positive for a productive cough.  No shortness of breath with exertion or at rest.  Skin:  no rash or lesions.  GU:  Positive dysuria, change in color of urine, no urgency or frequency.   Musculoskeletal:  Positive for right shoulder pain. No decreased range of motion. No back pain.  Psych:  No change in mood or affect. No depression or anxiety.    Past Medical History  Diagnosis Date  . Chronic obstructive pulmonary disease   . Personal history of venous thrombosis and embolism     PTE after nephrectomy  . Esophageal reflux   . Irritable bowel syndrome   . Unspecified essential hypertension   . Other and unspecified hyperlipidemia   . Diverticulosis of colon (without mention of hemorrhage)   . Spinal stenosis   . History of colonic polyps 06/05/05    hyperplastic  . Diverticulosis   . Internal hemorrhoids   . Type II or unspecified type diabetes mellitus without mention of complication, not stated as uncontrolled 02/05/2012    pt states it was in 2007  . Ischemic colitis   . Asthma   . Malignant neoplasm of kidney and other and unspecified urinary organs     renal carcinoma, left nephrectomy in 2008-per patient.   Past Surgical History  Procedure Laterality Date  . Septoplasty    . Tonsillectomy    . Breast biopsy    . Colonoscopy w/ polypectomy  2000  . Colonoscopy w/ polypectomy  07/2004    Hyperplastic polyps  . Dilation and curettage of uterus  03/2005    Uterine Polyps  . Nephrectomy  2007    right,Dr Dahlstedt   Social History:  reports that she has never smoked. She has never used smokeless tobacco. She reports that she does not drink alcohol or use illicit drugs.  She lives at home with her husband.  Independent with ADLs  Allergies  Allergen Reactions  . Codeine     nausea  . Verapamil     Pain in feet    Family History  Problem Relation Age of Onset  . Stroke Mother     onset in 27s  . Diabetes  Mother   . Cancer Mother     bladder; nephrectomy for calculi/also uterine , TAH & BSO  . Aneurysm Mother     thoracic  . Depression Mother   . Stroke Brother   . Heart failure Brother   . Heart attack Other     maternal family history  . Heart failure Maternal Grandfather   . Lung cancer Father   . Heart failure Sister   . Depression Sister   . Alzheimer's disease Sister   . COPD Sister   . Aneurysm Brother     cns  . Depression Maternal Aunt     X2  . Bipolar disorder Sister   . Alcoholism Neg Hx      Prior to Admission medications   Medication Sig Start Date End Date Taking? Authorizing Provider  acetaminophen (TYLENOL) 650 MG CR tablet Take 650 mg by mouth every 8 (eight) hours as needed. For pain   Yes Historical Provider, MD  aspirin EC 81 MG tablet Take 81-162 mg by mouth daily.    Yes Historical Provider, MD  Benzalkonium Chloride (DIABETIC BASICS HEALTHY FOOT EX) Apply 1 application topically daily as needed (foot pain). OTC diabetic foot cream   Yes Historical Provider, MD  Blood Glucose Monitoring Suppl (TRUERESULT BLOOD GLUCOSE) W/DEVICE KIT Use to test blood sugar ICD 10 E11 9 07/14/14  Yes Hendricks Limes, MD  Cholecalciferol (VITAMIN D PO) Take 1 tablet by mouth daily.   Yes Historical Provider, MD  CRANBERRY PO Take 2 tablets by mouth daily.   Yes Historical Provider, MD  fluticasone (FLOVENT HFA) 44 MCG/ACT inhaler inhale 1 to 2 puffs by mouth twice a day if needed 08/11/14  Yes Hendricks Limes, MD  glucose blood (TRUETEST TEST) test strip Use to test blood sugar ICD 10 E11 9 07/14/14  Yes Hendricks Limes, MD  lisinopril (PRINIVIL,ZESTRIL) 40 MG tablet Take 1 tablet (40 mg total) by mouth daily. 08/11/14  Yes Hendricks Limes, MD  loratadine (CLARITIN) 10 MG tablet Take 10 mg by mouth daily.   Yes Historical Provider, MD  NASAL SPRAY SALINE NA Place 1-2 sprays into both nostrils daily.   Yes Historical Provider, MD  ranitidine (ZANTAC) 150 MG tablet take 1  tablet by mouth every 12 hours if needed for REFLUX 08/11/14  Yes Hendricks Limes, MD  salmeterol (SEREVENT DISKUS) 50 MCG/DOSE diskus inhaler inhale 1 puff orally twice a day 08/11/14  Yes Hendricks Limes, MD  traMADol (ULTRAM) 50 MG tablet TAKE 1/2 TO 1 TABLET BY MOUTH EVERY 8 HOURS IF NEEDED. 09/22/14  Yes Hendricks Limes, MD  TRUEPLUS LANCETS 28G MISC Use to test blood sugar ICD 10 E11 9 07/14/14  Yes Hendricks Limes, MD  vitamin C (ASCORBIC ACID) 500 MG tablet Take 500 mg by mouth daily.   Yes Historical Provider, MD  buPROPion (WELLBUTRIN) 75 MG tablet take 1 tablet by mouth twice a day Patient not taking: Reported on 11/22/2014 08/12/14   Hendricks Limes, MD  montelukast (SINGULAIR) 10 MG tablet Take 1 tablet (10 mg total) by mouth daily. Patient not taking: Reported on 11/22/2014 10/03/14   Hendricks Limes, MD  [facility] HFA 108 [phone] BASE) MCG/ACT inhaler use as directed Patient not taking: Reported on 11/22/2014 06/06/14   Hendricks Limes, MD   Physical Exam: Filed Vitals:   11/22/14 0915 11/22/14 0945 11/22/14 1030 11/22/14 1311  BP: 110/58 139/70 128/71 109/65  Pulse: 77 72 74 77  Temp:      TempSrc:      Resp: _0 SpO2: 98% 96% 95% 96%    Wt Readings from Last 3 Encounters:  10/03/14 63.617 kg (140 lb 4 oz)  08/11/14 63.05 kg (139 lb)  02/25/14 63.504 kg (140 lb)    General: Patient is alert and oriented and does not appear to be in any discomfort.  Appears younger than stated age. Eyes: PERRL, normal lids, irises & conjunctiva ENT: grossly normal hearing,  Dry lips & tongue Neck: +Bilateral Cervical Lymphadenopathy, masses or thyromegaly Cardiovascular: RRR, no m/r/g. No LE edema.  Respiratory: CTA bilaterally, no w/r/r. Normal respiratory effort. Abdomen: soft, ntnd.  Normoactive bowel sounds in all four quadrants.   Skin: no rash or induration, no bruising. Musculoskeletal: Painful movement of right shoulder. 5/5 strength in each. Psychiatric: grossly normal  mood and affect, speech fluent and appropriate Neurologic: CN 2-12 grossly in tact.  non-focal.  Follows commands.          Labs on Admission:  Basic Metabolic Panel:  Recent Labs Lab 11/22/14 0935  NA 140  K 4.2  CL 107  CO2 22  GLUCOSE 112*  BUN 18  CREATININE 1.18*  CALCIUM 9.3   Liver Function Tests:  Recent Labs Lab 11/22/14 0935  AST 26  ALT 24  ALKPHOS 55  BILITOT 0.9  PROT 7.0  ALBUMIN 3.8   CBC:  Recent Labs Lab 11/22/14 0935  WBC 7.8  NEUTROABS 4.2  HGB 15.0  HCT 44.3  MCV 86.9  PLT 302   Radiological Exams on Admission: Ct Angio Chest Pe W/cm &/or Wo Cm  11/22/2014   CLINICAL DATA:  Right arm pain into the chest for 2 days. History of renal cell carcinoma and DVT/PE  EXAM: CT ANGIOGRAPHY CHEST WITH CONTRAST  TECHNIQUE: Multidetector CT imaging of the chest was performed using the standard protocol during bolus administration of intravenous contrast. Multiplanar CT image reconstructions and MIPs were obtained to evaluate the vascular anatomy.  CONTRAST:  32m OMNIPAQUE IOHEXOL 350 MG/ML SOLN  COMPARISON:  02/21/2010  FINDINGS: THORACIC INLET/BODY WALL:  Dermal inclusion cyst in the midline upper back measuring 26 mm, increased from 2011.  No acute finding.  MEDIASTINUM:  Normal heart size. No pericardial effusion. Limited opacification of the aorta and great vessels, but no evidence of acute aortic disease. Thickening of the descending thoracic wall is stable from 2011 and atherosclerotic. No evidence of pulmonary embolism. No lymphadenopathy. Negative esophagus  LUNG WINDOWS:  Bronchial wall thickening, mainly in the lower lobes, with intermittent mucoid impaction in the right lower lobe. No consolidation, edema, effusion, or pneumothorax. No suspicious pulmonary nodules.  UPPER ABDOMEN:  No acute findings.  OSSEOUS:  No acute fracture.  No suspicious lytic or blastic lesions.  Review of the MIP images confirms the above findings.  IMPRESSION: 1. No evidence  of pulmonary embolism. 2. Mild bronchitis.   Electronically Signed   By: Monte Fantasia M.D.   On: 11/22/2014 12:53   Dg Chest Port 1 View  11/22/2014   CLINICAL DATA:  Right chest pain starting yesterday morning  EXAM: PORTABLE CHEST - 1 VIEW  COMPARISON:  10/03/2014  FINDINGS: Normal heart size for technique. There is stable aortic tortuosity. Chronic mild reticular markings in the lower lungs with slight increase in density at the left base compared to prior. There is no edema, effusion, or pneumothorax.  IMPRESSION: Probable mild atelectasis at the left base. No edema or suspected pneumonia.   Electronically Signed   By: Monte Fantasia M.D.   On: 11/22/2014 09:49    EKG: Independently reviewed. Normal sinus rhythm with sinus arrhythmia.  Left axis deviation.   Assessment/Plan Active Problems:   Chest pain -right sided chest pain radiating into right arm and shoulder rated as 5 out of 10 by the patient -Pain is reproducible by applying resistance to right shoulder -EKG, Troponin, and CT with no acute coronary findings -Likely musculoskeletal in etiology due to it being reproducible and negative findings on cardiac workup -Perform serial troponin, tsh, 2d echo, and continue aspirin    Acute bronchitis in the setting of COPD -With productive cough and mucous impaction on CT -Azithromycin, Nebulizer,  Mucinex, and flutter valve.   -monitor oxygen saturation  Muscle Pain -due to her working in garden. -it is reproducible  -Aspercreme, Heating pad, tramadol as needed -should resolve in a few days  Dysuria  -pain with urination and history of uti's  -urinalysis and culture pending. -treat accordingly.  No antibiotics started yet.   DM Diet controlled.   GERD Famotidine   CKD stage 3 Continue Lisinopril.  AM bmet.    Code Status: Full code  DVT Prophylaxis:  SCD's, Lovenox Family Communication: No family present.  Plan discussed with patient.   Disposition Plan: Observe  inpatient.  Presumptive discharge 11/23/14.  Time spent: 1 hour.  Marlou Starks [place] PA-S Imogene Burn, [place] [facility] Pager [phone]

## 2014-11-22 NOTE — ED Notes (Signed)
Pt c/o right arm pain into right chest area x 2 days; pt sts worked in garden yesterday

## 2014-11-22 NOTE — ED Notes (Signed)
Pt stated that her husband has heart problems and has a prescription for nitroglycerin. She took 3 Nitro with no relief from pain.

## 2014-11-23 DIAGNOSIS — R0789 Other chest pain: Principal | ICD-10-CM

## 2014-11-23 DIAGNOSIS — J209 Acute bronchitis, unspecified: Secondary | ICD-10-CM | POA: Diagnosis not present

## 2014-11-23 DIAGNOSIS — Z7982 Long term (current) use of aspirin: Secondary | ICD-10-CM | POA: Diagnosis not present

## 2014-11-23 DIAGNOSIS — Z86718 Personal history of other venous thrombosis and embolism: Secondary | ICD-10-CM | POA: Diagnosis not present

## 2014-11-23 DIAGNOSIS — R072 Precordial pain: Secondary | ICD-10-CM

## 2014-11-23 DIAGNOSIS — J440 Chronic obstructive pulmonary disease with acute lower respiratory infection: Secondary | ICD-10-CM | POA: Diagnosis not present

## 2014-11-23 DIAGNOSIS — J45909 Unspecified asthma, uncomplicated: Secondary | ICD-10-CM | POA: Diagnosis not present

## 2014-11-23 DIAGNOSIS — E119 Type 2 diabetes mellitus without complications: Secondary | ICD-10-CM | POA: Diagnosis not present

## 2014-11-23 DIAGNOSIS — Z85528 Personal history of other malignant neoplasm of kidney: Secondary | ICD-10-CM | POA: Diagnosis not present

## 2014-11-23 DIAGNOSIS — N183 Chronic kidney disease, stage 3 (moderate): Secondary | ICD-10-CM | POA: Diagnosis not present

## 2014-11-23 LAB — BASIC METABOLIC PANEL
Anion gap: 8 (ref 5–15)
BUN: 16 mg/dL (ref 6–23)
CHLORIDE: 109 mmol/L (ref 96–112)
CO2: 24 mmol/L (ref 19–32)
CREATININE: 1.21 mg/dL — AB (ref 0.50–1.10)
Calcium: 8.6 mg/dL (ref 8.4–10.5)
GFR calc Af Amer: 47 mL/min — ABNORMAL LOW (ref >90)
GFR calc non Af Amer: 40 mL/min — ABNORMAL LOW (ref >90)
Glucose, Bld: 117 mg/dL — ABNORMAL HIGH (ref 70–99)
Potassium: 4.3 mmol/L (ref 3.5–5.1)
SODIUM: 141 mmol/L (ref 135–145)

## 2014-11-23 LAB — TROPONIN I: Troponin I: <0.03 ng/mL (ref <0.031)

## 2014-11-23 MED ORDER — VALSARTAN 40 MG PO TABS: 40.0000 mg | ORAL_TABLET | Freq: Every day | ORAL | Status: DC

## 2014-11-23 MED ORDER — DEXTROMETHORPHAN-GUAIFENESIN 10-100 MG/5ML PO LIQD: 10.0000 mL | ORAL | Status: DC | PRN

## 2014-11-23 MED ORDER — PANTOPRAZOLE SODIUM 40 MG PO TBEC: 40.0000 mg | DELAYED_RELEASE_TABLET | Freq: Every day | ORAL | Status: DC

## 2014-11-23 MED ORDER — AZITHROMYCIN 250 MG PO TABS: 250.0000 mg | ORAL_TABLET | Freq: Every day | ORAL | Status: DC

## 2014-11-23 NOTE — Discharge Summary (Signed)
Alexa [name], is a 81 y.o. female  DOB March 23, 1930  MRN [id number].  Admission date:  11/22/2014  Admitting Physician  Erline Hau, MD  Discharge Date:  11/23/2014   Primary MD  Unice Cobble, MD  Recommendations for primary care physician for things to follow:   Outpatient pulmonary follow-up for chronic cough.  Repeat 2 view chest x-ray, CBC BMP next visit  Monitor BMP closely swished from ACE to ARB for chronic cough.    Admission Diagnosis  Other chest pain [R07.89] Chest pain [R07.9]   Discharge Diagnosis  Other chest pain [R07.89] Chest pain [R07.9]    Active Problems:   Chest pain   Acute bronchitis      Past Medical History  Diagnosis Date  . Chronic obstructive pulmonary disease   . Personal history of venous thrombosis and embolism     PTE after nephrectomy  . Esophageal reflux   . Irritable bowel syndrome   . Unspecified essential hypertension   . Other and unspecified hyperlipidemia   . Diverticulosis of colon (without mention of hemorrhage)   . Spinal stenosis   . History of colonic polyps 06/05/05    hyperplastic  . Diverticulosis   . Internal hemorrhoids   . Type II or unspecified type diabetes mellitus without mention of complication, not stated as uncontrolled 02/05/2012    pt states it was in 2007  . Ischemic colitis   . Asthma   . Malignant neoplasm of kidney and other and unspecified urinary organs     renal carcinoma, left nephrectomy in 2008-per patient.    Past Surgical History  Procedure Laterality Date  . Septoplasty    . Tonsillectomy    . Breast biopsy    . Colonoscopy w/ polypectomy  2000  . Colonoscopy w/ polypectomy  07/2004    Hyperplastic polyps  . Dilation and curettage of uterus  03/2005    Uterine Polyps  . Nephrectomy  2007   right,Dr Dahlstedt       History of present illness and  Hospital Course:     Kindly see H&P for history of present illness and admission details, please review complete Labs, Consult reports and Test reports for all details in brief  HPI  from the history and physical done on the day of admission  Alexa [name] is a 81 y.o. female with PMH of COPD, venous thrombosis, asthma, diverticulosis, cancer(kidney removed) ,and ischemic colitis. Patient reports that yesterday(11/21/14) she was working in her garden, and later that day pain developed on the right side of her chest. It radiates into her right arm and shoulder. The patient is able to be reproduced by moving in bed, and by applying resistance to her shoulder. Patient was concerned that it was a cardiac event and took some of her husbands nitroglycerin with no relief. She also took aspirin before coming to the ER with no relief from pain. She reported to ER staff that pain has waxed and waned since yesterday at approximately a 5 out  of 10. Morphine was given in the ER, and her pain resolved. She is concerned about her family history of numerous aneurysms and MIs. Her brother passed at 68 with an MI. She does complain of having a cold for the past two years with allergy symptoms. She has a productive cough at times.   In the ER(11/22/14) there was no acute cardiac findings on EKG. Her troponin was not elevated. A CTA was done due to her history of DVT and no evidence of a PE was found, however, she does have mucoid impaction of the right lower lobe.   Hospital Course     1. Right-sided resolved musculoskeletal chest pain. Brought on by excessive gardening done a day before along with a productive cough which has been present for the last few days, EKG unremarkable, CT angiogram chest unremarkable. Completely symptom free. Will be discharged home with PCP follow up.   2. Acute bronchitis. CT and chest x-ray do not show any  infiltrate. No signs of pneumonia, continue allergy medications was started on azithromycin which I will complete for 5 more days. Her UA was clean.   3. Chronic cough. Present on intermittent basis for least 2 years per patient. Continue allergy medications, stop Zantac switched to PPI, stop ACE inhibitor switch to ARB. Follow with pulmonary one-time outpatient.   4. CK D stage III. Baseline creatinine 1.2. I have switched her from ACE to low-dose ARB. Request PCP to monitor BMP very closely. Check BMP once a week 2 to document stability.   5.GERD. Switch from Zantac to PPI for cough.       Discharge Condition: Stable   Follow UP  Follow-up Information    Follow up with Unice Cobble, MD. Schedule an appointment as soon as possible for a visit in 1 week.   Specialty:  Internal Medicine   Contact information:   [address] [address] [phone]       Follow up with [facility], MD. Schedule an appointment as soon as possible for a visit in 1 week.   Specialty:  Pulmonary Disease   Why:  chronic cough   Contact information:   [place] [address] [phone]         Discharge Instructions  and  Discharge Medications      Discharge Instructions    Diet - low sodium heart healthy    Complete by:  As directed      Discharge instructions    Complete by:  As directed   Follow with Primary MD Unice Cobble, MD in 7 days   Get CBC, CMP, 2 view Chest X ray checked  by Primary MD next visit.    Activity: As tolerated with Full fall precautions use walker/cane & assistance as needed   Disposition Home    Diet: Heart Healthy   For Heart failure patients - Check your Weight same time everyday, if you gain over 2 pounds, or you develop in leg swelling, experience more shortness of breath or chest pain, call your Primary MD immediately. Follow Cardiac Low Salt Diet and 1.5 lit/day fluid restriction.   On your next visit with your  primary care physician please Get Medicines reviewed and adjusted.   Please request your Prim.MD to go over all Hospital Tests and Procedure/Radiological results at the follow up, please get all Hospital records sent to your Prim MD by signing hospital release before you go home.   If you experience worsening of your  admission symptoms, develop shortness of breath, life threatening emergency, suicidal or homicidal thoughts you must seek medical attention immediately by calling 911 or calling your MD immediately  if symptoms less severe.  You Must read complete instructions/literature along with all the possible adverse reactions/side effects for all the Medicines you take and that have been prescribed to you. Take any new Medicines after you have completely understood and accpet all the possible adverse reactions/side effects.   Do not drive, operating heavy machinery, perform activities at heights, swimming or participation in water activities or provide baby sitting services if your were admitted for syncope or siezures until you have seen by Primary MD or a Neurologist and advised to do so again.  Do not drive when taking Pain medications.    Do not take more than prescribed Pain, Sleep and Anxiety Medications  Special Instructions: If you have smoked or chewed Tobacco  in the last 2 yrs please stop smoking, stop any regular Alcohol  and or any Recreational drug use.  Wear Seat belts while driving.   Please note  You were cared for by a hospitalist during your hospital stay. If you have any questions about your discharge medications or the care you received while you were in the hospital after you are discharged, you can call the unit and asked to speak with the hospitalist on call if the hospitalist that took care of you is not available. Once you are discharged, your primary care physician will handle any further medical issues. Please note that NO REFILLS for any discharge medications  will be authorized once you are discharged, as it is imperative that you return to your primary care physician (or establish a relationship with a primary care physician if you do not have one) for your aftercare needs so that they can reassess your need for medications and monitor your lab values.     Increase activity slowly    Complete by:  As directed             Medication List    STOP taking these medications        lisinopril 40 MG tablet  Commonly known as:  PRINIVIL,ZESTRIL     ranitidine 150 MG tablet  Commonly known as:  ZANTAC      TAKE these medications        acetaminophen 650 MG CR tablet  Commonly known as:  TYLENOL  Take 650 mg by mouth every 8 (eight) hours as needed. For pain     aspirin EC 81 MG tablet  Take 81-162 mg by mouth daily.     azithromycin 250 MG tablet  Commonly known as:  ZITHROMAX  Take 1 tablet (250 mg total) by mouth daily.     buPROPion 75 MG tablet  Commonly known as:  WELLBUTRIN  take 1 tablet by mouth twice a day     CRANBERRY PO  Take 2 tablets by mouth daily.     dextromethorphan-guaiFENesin 10-100 MG/5ML liquid  Commonly known as:  TUSSIN DM  Take 10 mLs by mouth every 4 (four) hours as needed for cough.     DIABETIC BASICS HEALTHY FOOT EX  Apply 1 application topically daily as needed (foot pain). OTC diabetic foot cream     fluticasone 44 MCG/ACT inhaler  Commonly known as:  FLOVENT HFA  inhale 1 to 2 puffs by mouth twice a day if needed     glucose blood test strip  Commonly known as:  TRUETEST TEST  Use to test blood sugar ICD 10 E11 9     loratadine 10 MG tablet  Commonly known as:  CLARITIN  Take 10 mg by mouth daily.     montelukast 10 MG tablet  Commonly known as:  SINGULAIR  Take 1 tablet (10 mg total) by mouth daily.     NASAL SPRAY SALINE NA  Place 1-2 sprays into both nostrils daily.     pantoprazole 40 MG tablet  Commonly known as:  PROTONIX  Take 1 tablet (40 mg total) by mouth daily.      PROAIR HFA 108 (90 BASE) MCG/ACT inhaler  Generic drug:  albuterol  use as directed     salmeterol 50 MCG/DOSE diskus inhaler  Commonly known as:  SEREVENT DISKUS  inhale 1 puff orally twice a day     traMADol 50 MG tablet  Commonly known as:  ULTRAM  TAKE 1/2 TO 1 TABLET BY MOUTH EVERY 8 HOURS IF NEEDED.     TRUEPLUS LANCETS 28G Misc  Use to test blood sugar ICD 10 E11 9     TRUERESULT BLOOD GLUCOSE W/DEVICE Kit  Use to test blood sugar ICD 10 E11 9     valsartan 40 MG tablet  Commonly known as:  DIOVAN  Take 1 tablet (40 mg total) by mouth daily.     vitamin C 500 MG tablet  Commonly known as:  ASCORBIC ACID  Take 500 mg by mouth daily.     VITAMIN D PO  Take 1 tablet by mouth daily.          Diet and Activity recommendation: See Discharge Instructions above   Consults obtained - None   Major procedures and Radiology Reports - PLEASE review detailed and final reports for all details, in brief -       Ct Angio Chest Pe W/cm &/or Wo Cm  11/22/2014   CLINICAL DATA:  Right arm pain into the chest for 2 days. History of renal cell carcinoma and DVT/PE  EXAM: CT ANGIOGRAPHY CHEST WITH CONTRAST  TECHNIQUE: Multidetector CT imaging of the chest was performed using the standard protocol during bolus administration of intravenous contrast. Multiplanar CT image reconstructions and MIPs were obtained to evaluate the vascular anatomy.  CONTRAST:  22m OMNIPAQUE IOHEXOL 350 MG/ML SOLN  COMPARISON:  02/21/2010  FINDINGS: THORACIC INLET/BODY WALL:  Dermal inclusion cyst in the midline upper back measuring 26 mm, increased from 2011.  No acute finding.  MEDIASTINUM:  Normal heart size. No pericardial effusion. Limited opacification of the aorta and great vessels, but no evidence of acute aortic disease. Thickening of the descending thoracic wall is stable from 2011 and atherosclerotic. No evidence of pulmonary embolism. No lymphadenopathy. Negative esophagus  LUNG WINDOWS:  Bronchial  wall thickening, mainly in the lower lobes, with intermittent mucoid impaction in the right lower lobe. No consolidation, edema, effusion, or pneumothorax. No suspicious pulmonary nodules.  UPPER ABDOMEN:  No acute findings.  OSSEOUS:  No acute fracture.  No suspicious lytic or blastic lesions.  Review of the MIP images confirms the above findings.  IMPRESSION: 1. No evidence of pulmonary embolism. 2. Mild bronchitis.   Electronically Signed   By: JMonte FantasiaM.D.   On: 11/22/2014 12:53   Dg Chest Port 1 View  11/22/2014   CLINICAL DATA:  Right chest pain starting yesterday morning  EXAM: PORTABLE CHEST - 1 VIEW  COMPARISON:  10/03/2014  FINDINGS: Normal heart size for technique. There is stable aortic tortuosity. Chronic mild  reticular markings in the lower lungs with slight increase in density at the left base compared to prior. There is no edema, effusion, or pneumothorax.  IMPRESSION: Probable mild atelectasis at the left base. No edema or suspected pneumonia.   Electronically Signed   By: Monte Fantasia M.D.   On: 11/22/2014 09:49    Micro Results      No results found for this or any previous visit (from the past 240 hour(s)).     Today   Subjective:   Loren Vicens today has no headache,no chest abdominal pain,no new weakness tingling or numbness, feels much better wants to go home today.    Objective:   Blood pressure 139/57, pulse 73, temperature 97.6 F (36.4 C), temperature source Oral, resp. rate 18, height _0  (1.575 m), weight 62.279 kg (137 lb 4.8 oz), SpO2 96 %.   Intake/Output Summary (Last 24 hours) at 11/23/14 1014 Last data filed at 11/23/14 0700  Gross per 24 hour  Intake    500 ml  Output    800 ml  Net   -300 ml    Exam Awake Alert, Oriented x 3, No new F.N deficits, Normal affect [place].AT,PERRAL Supple Neck,No JVD, No cervical lymphadenopathy appriciated.  Symmetrical Chest wall movement, Good air movement bilaterally, CTAB RRR,No Gallops,Rubs or new  Murmurs, No Parasternal Heave +ve B.Sounds, Abd Soft, Non tender, No organomegaly appriciated, No rebound -guarding or rigidity. No Cyanosis, Clubbing or edema, No new Rash or bruise  Data Review   CBC w Diff: Lab Results  Component Value Date   WBC 7.8 11/22/2014   HGB 15.0 11/22/2014   HCT 44.3 11/22/2014   PLT 302 11/22/2014   LYMPHOPCT 25 11/22/2014   MONOPCT 8 11/22/2014   EOSPCT 11* 11/22/2014   BASOPCT 2* 11/22/2014    CMP: Lab Results  Component Value Date   NA 141 11/23/2014   K 4.3 11/23/2014   CL 109 11/23/2014   CO2 24 11/23/2014   BUN 16 11/23/2014   CREATININE 1.21* 11/23/2014   CREATININE 1.08 05/07/2013   PROT 7.0 11/22/2014   ALBUMIN 3.8 11/22/2014   BILITOT 0.9 11/22/2014   ALKPHOS 55 11/22/2014   AST 26 11/22/2014   ALT 24 11/22/2014  .   Total Time in preparing paper work, data evaluation and todays exam - 35 minutes  Thurnell Lose M.D on 11/23/2014 at 10:14 AM  [facility]   Office  [phone]

## 2014-11-23 NOTE — Discharge Instructions (Signed)
Follow with Primary MD Alexa Cobble, MD in 7 days   Get CBC, CMP, 2 view Chest X ray checked  by Primary MD next visit.    Activity: As tolerated with Full fall precautions use walker/cane & assistance as needed   Disposition Home    Diet: Heart Healthy   For Heart failure patients - Check your Weight same time everyday, if you gain over 2 pounds, or you develop in leg swelling, experience more shortness of breath or chest pain, call your Primary MD immediately. Follow Cardiac Low Salt Diet and 1.5 lit/day fluid restriction.   On your next visit with your primary care physician please Get Medicines reviewed and adjusted.   Please request your Prim.MD to go over all Hospital Tests and Procedure/Radiological results at the follow up, please get all Hospital records sent to your Prim MD by signing hospital release before you go home.   If you experience worsening of your admission symptoms, develop shortness of breath, life threatening emergency, suicidal or homicidal thoughts you must seek medical attention immediately by calling 911 or calling your MD immediately  if symptoms less severe.  You Must read complete instructions/literature along with all the possible adverse reactions/side effects for all the Medicines you take and that have been prescribed to you. Take any new Medicines after you have completely understood and accpet all the possible adverse reactions/side effects.   Do not drive, operating heavy machinery, perform activities at heights, swimming or participation in water activities or provide baby sitting services if your were admitted for syncope or siezures until you have seen by Primary MD or a Neurologist and advised to do so again.  Do not drive when taking Pain medications.    Do not take more than prescribed Pain, Sleep and Anxiety Medications  Special Instructions: If you have smoked or chewed Tobacco  in the last 2 yrs please stop smoking, stop any regular  Alcohol  and or any Recreational drug use.  Wear Seat belts while driving.   Please note  You were cared for by a hospitalist during your hospital stay. If you have any questions about your discharge medications or the care you received while you were in the hospital after you are discharged, you can call the unit and asked to speak with the hospitalist on call if the hospitalist that took care of you is not available. Once you are discharged, your primary care physician will handle any further medical issues. Please note that NO REFILLS for any discharge medications will be authorized once you are discharged, as it is imperative that you return to your primary care physician (or establish a relationship with a primary care physician if you do not have one) for your aftercare needs so that they can reassess your need for medications and monitor your lab values.

## 2014-11-23 NOTE — Consult Note (Signed)
CARDIOLOGY CONSULT NOTE   Patient ID: Alexa [name] MRN: [id number] DOB/AGE: [birth year] 79 y.o.  Admit Date: 11/22/2014  Primary Physician: Unice Cobble, MD   Clinical Summary Alexa [name] is a 81 y.o.female.  The patient is currently on the chest pain unit. Therefore I I am reviewing her case from the cardiology viewpoint. She presented with a cough. She also had some chest discomfort. She was kept to rule out MI. Her symptoms seem most compatible with her pulmonary disease. She is stable. Troponins are normal 3. EKG is abnormal. However it is unchanged from the past. There is a Q wave in lead 3. There is decreased anterior R-wave progression.   Allergies  Allergen Reactions  . Codeine     nausea  . Verapamil     Pain in feet    Medications Scheduled Medications: . aspirin EC  81 mg Oral Daily  . azithromycin  500 mg Intravenous Q24H  . enoxaparin (LOVENOX) injection  40 mg Subcutaneous Q24H  . famotidine  20 mg Oral Daily  . fluticasone  1 puff Inhalation BID  . guaiFENesin  1,200 mg Oral BID  . ipratropium-albuterol  3 mL Nebulization Q6H  . lisinopril  40 mg Oral Daily  . loratadine  10 mg Oral Daily  . pneumococcal 23 valent vaccine  0.5 mL Intramuscular Tomorrow-1000  . sodium chloride  3 mL Intravenous Q12H     Infusions: . sodium chloride 10 mL/hr at 11/22/14 0942  . sodium chloride 50 mL/hr at 11/22/14 1600     PRN Medications:  acetaminophen **OR** acetaminophen, alum & mag hydroxide-simeth, dextromethorphan, morphine injection, MUSCLE RUB, ondansetron **OR** ondansetron (ZOFRAN) IV, oxyCODONE, traMADol   Past Medical History  Diagnosis Date  . Chronic obstructive pulmonary disease   . Personal history of venous thrombosis and embolism     PTE after nephrectomy  . Esophageal reflux   . Irritable bowel syndrome   . Unspecified essential hypertension   . Other and unspecified hyperlipidemia   . Diverticulosis of colon (without mention of  hemorrhage)   . Spinal stenosis   . History of colonic polyps 06/05/05    hyperplastic  . Diverticulosis   . Internal hemorrhoids   . Type II or unspecified type diabetes mellitus without mention of complication, not stated as uncontrolled 02/05/2012    pt states it was in 2007  . Ischemic colitis   . Asthma   . Malignant neoplasm of kidney and other and unspecified urinary organs     renal carcinoma, left nephrectomy in 2008-per patient.    Past Surgical History  Procedure Laterality Date  . Septoplasty    . Tonsillectomy    . Breast biopsy    . Colonoscopy w/ polypectomy  2000  . Colonoscopy w/ polypectomy  07/2004    Hyperplastic polyps  . Dilation and curettage of uterus  03/2005    Uterine Polyps  . Nephrectomy  2007    right,Dr Dahlstedt    Family History  Problem Relation Age of Onset  . Stroke Mother     onset in 85s  . Diabetes Mother   . Cancer Mother     bladder; nephrectomy for calculi/also uterine , TAH & BSO  . Aneurysm Mother     thoracic  . Depression Mother   . Stroke Brother   . Heart failure Brother   . Heart attack Other     maternal family history  . Heart failure Maternal Grandfather   . Lung cancer  Father   . Heart failure Sister   . Depression Sister   . Alzheimer's disease Sister   . COPD Sister   . Aneurysm Brother     cns  . Depression Maternal Aunt     X2  . Bipolar disorder Sister   . Alcoholism Neg Hx     Social History Alexa [name] reports that she has never smoked. She has never used smokeless tobacco. Alexa [name] reports that she does not drink alcohol.  Review of Systems Complete review of systems are found to be negative unless outlined in H&P above. Patient denies fever, chills, headache, sweats, rash, change in vision, change in hearing, nausea or vomiting, urinary symptoms. All other systems are reviewed and are negative.  Physical Examination Blood pressure 130/66, pulse 73, temperature 97.6 F (36.4 C), temperature  source Oral, resp. rate 18, height 5\' 2"  (1.575 m), weight 137 lb 4.8 oz (62.279 kg), SpO2 97 %.  Intake/Output Summary (Last 24 hours) at 11/23/14 0807 Last data filed at 11/23/14 0700  Gross per 24 hour  Intake    500 ml  Output    800 ml  Net   -300 ml   Patient is oriented to person time and place. Affect is normal. Head is atraumatic. Sclerae and conjunctivae are normal. There is no jugular venous distention. Lungs reveal scattered rhonchi. Cardiac exam reveals S1 and S2. The abdomen is soft. There is no peripheral edema. There are no musculoskeletal deformities. There are no skin rashes. Neurologic is grossly intact.  Prior Cardiac Testing/Procedures  Lab Results  Basic Metabolic Panel:  Recent Labs Lab 11/22/14 0935 11/23/14 0315  NA 140 141  K 4.2 4.3  CL 107 109  CO2 22 24  GLUCOSE 112* 117*  BUN 18 16  CREATININE 1.18* 1.21*  CALCIUM 9.3 8.6    Liver Function Tests:  Recent Labs Lab 11/22/14 0935  AST 26  ALT 24  ALKPHOS 55  BILITOT 0.9  PROT 7.0  ALBUMIN 3.8    CBC:  Recent Labs Lab 11/22/14 0935  WBC 7.8  NEUTROABS 4.2  HGB 15.0  HCT 44.3  MCV 86.9  PLT 302    Cardiac Enzymes:  Recent Labs Lab 11/22/14 1705 11/22/14 2045 11/23/14 0315  TROPONINI <0.03 <0.03 <0.03    BNP: Invalid input(s): POCBNP   Radiology: Ct Angio Chest Pe W/cm &/or Wo Cm  11/22/2014   CLINICAL DATA:  Right arm pain into the chest for 2 days. History of renal cell carcinoma and DVT/PE  EXAM: CT ANGIOGRAPHY CHEST WITH CONTRAST  TECHNIQUE: Multidetector CT imaging of the chest was performed using the standard protocol during bolus administration of intravenous contrast. Multiplanar CT image reconstructions and MIPs were obtained to evaluate the vascular anatomy.  CONTRAST:  27mL OMNIPAQUE IOHEXOL 350 MG/ML SOLN  COMPARISON:  02/21/2010  FINDINGS: THORACIC INLET/BODY WALL:  Dermal inclusion cyst in the midline upper back measuring 26 mm, increased from 2011.  No  acute finding.  MEDIASTINUM:  Normal heart size. No pericardial effusion. Limited opacification of the aorta and great vessels, but no evidence of acute aortic disease. Thickening of the descending thoracic wall is stable from 2011 and atherosclerotic. No evidence of pulmonary embolism. No lymphadenopathy. Negative esophagus  LUNG WINDOWS:  Bronchial wall thickening, mainly in the lower lobes, with intermittent mucoid impaction in the right lower lobe. No consolidation, edema, effusion, or pneumothorax. No suspicious pulmonary nodules.  UPPER ABDOMEN:  No acute findings.  OSSEOUS:  No acute fracture.  No suspicious lytic or blastic lesions.  Review of the MIP images confirms the above findings.  IMPRESSION: 1. No evidence of pulmonary embolism. 2. Mild bronchitis.   Electronically Signed   By: Monte Fantasia M.D.   On: 11/22/2014 12:53   Dg Chest Port 1 View  11/22/2014   CLINICAL DATA:  Right chest pain starting yesterday morning  EXAM: PORTABLE CHEST - 1 VIEW  COMPARISON:  10/03/2014  FINDINGS: Normal heart size for technique. There is stable aortic tortuosity. Chronic mild reticular markings in the lower lungs with slight increase in density at the left base compared to prior. There is no edema, effusion, or pneumothorax.  IMPRESSION: Probable mild atelectasis at the left base. No edema or suspected pneumonia.   Electronically Signed   By: Monte Fantasia M.D.   On: 11/22/2014 09:49    ECG:   I have reviewed current and old EKGs. She has an old Q in lead 3. There is old decrease anterior R-wave progression. There is no significant change from the past.  Telemetry:   I have reviewed telemetry today November 23, 2014. There is normal sinus rhythm.   Impression and Recommendations    Chest pain     At this time it appears that her chest pain is not cardiac in origin. Her EKG is abnormal. If she has further symptoms she can review these with her primary physician. No further workup is recommended in the  hospital at this time.    Acute bronchitis      It appears that this was the main symptom for her presenting to the hospital. She is improving already.  Daryel November, MD 11/23/2014, 8:07 AM

## 2014-11-23 NOTE — Care Management (Signed)
UR Completed Ottie Tillery Graves-Bigelow, RN,BSN [phone]  

## 2014-11-24 ENCOUNTER — Telehealth: Payer: Self-pay | Admitting: *Deleted

## 2014-11-24 LAB — URINE CULTURE: Colony Count: 10000

## 2014-11-24 NOTE — Telephone Encounter (Signed)
Called pt concerning TCM appt no answer LMOM RTC.../lmb 

## 2014-11-25 NOTE — Telephone Encounter (Signed)
Called pt again still no answer LMOM RTC.../lmb 

## 2014-11-28 NOTE — Telephone Encounter (Signed)
Pt return call back completed tcm call below.../lmb  Transition Care Management Follow-up Telephone Call D/C 11/23/14  How have you been since you were released from the hospital? Pt states she is doing fine   Do you understand why you were in the hospital? YES   Do you understand the discharge instrcutions? YES  Items Reviewed:  Medications reviewed: YES  Allergies reviewed: YES  Dietary changes reviewed: NO  Referrals reviewed: No referrals recommended   Functional Questionnaire:   Activities of Daily Living (ADLs):   She states she are independent in the following: ambulation, bathing and hygiene, feeding, continence, grooming, toileting and dressing States she doesn't require assistance    Any transportation issues/concerns?: NO   Any patient concerns? NO   Confirmed importance and date/time of follow-up visits scheduled: YES, pt made appt w/Dr. Linna Darner 12/13/14   Confirmed with patient if condition begins to worsen call PCP or go to the ER.

## 2014-12-02 ENCOUNTER — Other Ambulatory Visit: Payer: Self-pay

## 2014-12-02 MED ORDER — TRAMADOL HCL 50 MG PO TABS: ORAL_TABLET | ORAL | Status: DC

## 2014-12-02 NOTE — Telephone Encounter (Signed)
Tramadol has been called to [facility] aid

## 2014-12-02 NOTE — Telephone Encounter (Signed)
OK X1 

## 2014-12-12 ENCOUNTER — Emergency Department (HOSPITAL_COMMUNITY)
Admission: EM | Admit: 2014-12-12 | Discharge: 2014-12-12 | Disposition: A | Discharge status: Home / Self Care | Payer: Commercial Managed Care - HMO | Attending: Emergency Medicine | Admitting: Emergency Medicine

## 2014-12-12 ENCOUNTER — Emergency Department (HOSPITAL_COMMUNITY): Payer: Commercial Managed Care - HMO

## 2014-12-12 ENCOUNTER — Encounter (HOSPITAL_COMMUNITY): Payer: Self-pay | Admitting: Nurse Practitioner

## 2014-12-12 DIAGNOSIS — M79604 Pain in right leg: Secondary | ICD-10-CM | POA: Insufficient documentation

## 2014-12-12 DIAGNOSIS — M47812 Spondylosis without myelopathy or radiculopathy, cervical region: Secondary | ICD-10-CM | POA: Diagnosis not present

## 2014-12-12 DIAGNOSIS — Z79899 Other long term (current) drug therapy: Secondary | ICD-10-CM | POA: Diagnosis not present

## 2014-12-12 DIAGNOSIS — Z7982 Long term (current) use of aspirin: Secondary | ICD-10-CM | POA: Insufficient documentation

## 2014-12-12 DIAGNOSIS — E119 Type 2 diabetes mellitus without complications: Secondary | ICD-10-CM | POA: Insufficient documentation

## 2014-12-12 DIAGNOSIS — Z86010 Personal history of colonic polyps: Secondary | ICD-10-CM | POA: Insufficient documentation

## 2014-12-12 DIAGNOSIS — Z86718 Personal history of other venous thrombosis and embolism: Secondary | ICD-10-CM | POA: Diagnosis not present

## 2014-12-12 DIAGNOSIS — R202 Paresthesia of skin: Secondary | ICD-10-CM | POA: Diagnosis not present

## 2014-12-12 DIAGNOSIS — K219 Gastro-esophageal reflux disease without esophagitis: Secondary | ICD-10-CM | POA: Insufficient documentation

## 2014-12-12 DIAGNOSIS — J449 Chronic obstructive pulmonary disease, unspecified: Secondary | ICD-10-CM | POA: Diagnosis not present

## 2014-12-12 DIAGNOSIS — M4802 Spinal stenosis, cervical region: Secondary | ICD-10-CM | POA: Insufficient documentation

## 2014-12-12 DIAGNOSIS — M2578 Osteophyte, vertebrae: Secondary | ICD-10-CM | POA: Diagnosis not present

## 2014-12-12 DIAGNOSIS — I10 Essential (primary) hypertension: Secondary | ICD-10-CM | POA: Insufficient documentation

## 2014-12-12 DIAGNOSIS — M79605 Pain in left leg: Secondary | ICD-10-CM | POA: Diagnosis present

## 2014-12-12 DIAGNOSIS — M9971 Connective tissue and disc stenosis of intervertebral foramina of cervical region: Secondary | ICD-10-CM | POA: Diagnosis not present

## 2014-12-12 LAB — URINALYSIS, ROUTINE W REFLEX MICROSCOPIC
BILIRUBIN URINE: NEGATIVE
GLUCOSE, UA: NEGATIVE mg/dL
HGB URINE DIPSTICK: NEGATIVE
KETONES UR: NEGATIVE mg/dL
Nitrite: NEGATIVE
PROTEIN: NEGATIVE mg/dL
Specific Gravity, Urine: 1.01 (ref 1.005–1.030)
Urobilinogen, UA: 0.2 mg/dL (ref 0.0–1.0)
pH: 5 (ref 5.0–8.0)

## 2014-12-12 LAB — CBC
HEMATOCRIT: 44.2 % (ref 36.0–46.0)
Hemoglobin: 15.2 g/dL — ABNORMAL HIGH (ref 12.0–15.0)
MCH: 29.9 pg (ref 26.0–34.0)
MCHC: 34.4 g/dL (ref 30.0–36.0)
MCV: 86.8 fL (ref 78.0–100.0)
Platelets: 316 10*3/uL (ref 150–400)
RBC: 5.09 MIL/uL (ref 3.87–5.11)
RDW: 13.7 % (ref 11.5–15.5)
WBC: 9.3 10*3/uL (ref 4.0–10.5)

## 2014-12-12 LAB — URINE MICROSCOPIC-ADD ON

## 2014-12-12 LAB — BASIC METABOLIC PANEL
Anion gap: 10 (ref 5–15)
BUN: 21 mg/dL — AB (ref 6–20)
CO2: 22 mmol/L (ref 22–32)
CREATININE: 1.26 mg/dL — AB (ref 0.44–1.00)
Calcium: 9.4 mg/dL (ref 8.9–10.3)
Chloride: 104 mmol/L (ref 101–111)
GFR calc non Af Amer: 38 mL/min — ABNORMAL LOW (ref >60)
GFR, EST AFRICAN AMERICAN: 44 mL/min — AB (ref >60)
Glucose, Bld: 119 mg/dL — ABNORMAL HIGH (ref 65–99)
Potassium: 4.3 mmol/L (ref 3.5–5.1)
SODIUM: 136 mmol/L (ref 135–145)

## 2014-12-12 MED ORDER — ONDANSETRON 4 MG PO TBDP
ORAL_TABLET | ORAL | Status: AC
  Administered 2014-12-12: 4 mg
  Filled 2014-12-12: qty 1

## 2014-12-12 MED ORDER — OXYCODONE-ACETAMINOPHEN 5-325 MG PO TABS
1.0000 | ORAL_TABLET | Freq: Once | ORAL | Status: DC
  Filled 2014-12-12: qty 1

## 2014-12-12 MED ORDER — GABAPENTIN 100 MG PO CAPS: 100.0000 mg | ORAL_CAPSULE | Freq: Three times a day (TID) | ORAL | Status: DC

## 2014-12-12 MED ORDER — OXYCODONE-ACETAMINOPHEN 5-325 MG PO TABS
ORAL_TABLET | ORAL | Status: AC
  Administered 2014-12-12: 1
  Filled 2014-12-12: qty 1

## 2014-12-12 MED ORDER — LORAZEPAM 1 MG PO TABS
0.5000 mg | ORAL_TABLET | Freq: Once | ORAL | Status: AC
  Administered 2014-12-12: 0.5 mg via ORAL
  Filled 2014-12-12: qty 1

## 2014-12-12 MED ORDER — ONDANSETRON 4 MG PO TBDP
4.0000 mg | ORAL_TABLET | Freq: Once | ORAL | Status: DC
  Filled 2014-12-12: qty 1

## 2014-12-12 MED ORDER — GABAPENTIN 100 MG PO CAPS
100.0000 mg | ORAL_CAPSULE | Freq: Once | ORAL | Status: AC
  Administered 2014-12-12: 100 mg via ORAL
  Filled 2014-12-12: qty 1

## 2014-12-12 NOTE — ED Provider Notes (Signed)
CSN: [id number]     Arrival date & time 12/12/14  1319 History   First MD Initiated Contact with Patient 12/12/14 1532     Chief Complaint  Patient presents with  . Leg Pain     Patient is a 81 y.o. female presenting with leg pain. The history is provided by the patient. No language interpreter was used.  Leg Pain  Alexa [name] presents for evaluation of lower extremity pain and numbness.  She reports three days of progressive numbness and deep achy pain in BLE from the knees to the feet.  She also has increasing neck pain.  She is having increased difficulty with ambulation due to numbness and generalized weakness.  She denies any fevers.  She has DOE.  She reports two months of posterior and right lateral neck pain, which is much worse for the last three days.  No chest pain.  No vomiting, no diarrhea.  Sxs are moderate, constant, worsening.    Past Medical History  Diagnosis Date  . Chronic obstructive pulmonary disease   . Personal history of venous thrombosis and embolism     PTE after nephrectomy  . Esophageal reflux   . Irritable bowel syndrome   . Unspecified essential hypertension   . Other and unspecified hyperlipidemia   . Diverticulosis of colon (without mention of hemorrhage)   . Spinal stenosis   . History of colonic polyps 06/05/05    hyperplastic  . Diverticulosis   . Internal hemorrhoids   . Type II or unspecified type diabetes mellitus without mention of complication, not stated as uncontrolled 02/05/2012    pt states it was in 2007  . Ischemic colitis   . Asthma   . Malignant neoplasm of kidney and other and unspecified urinary organs     renal carcinoma, left nephrectomy in 2008-per patient.   Past Surgical History  Procedure Laterality Date  . Septoplasty    . Tonsillectomy    . Breast biopsy    . Colonoscopy w/ polypectomy  2000  . Colonoscopy w/ polypectomy  07/2004    Hyperplastic polyps  . Dilation and curettage of uterus  03/2005    Uterine Polyps  .  Nephrectomy  2007    right,Dr Dahlstedt   Family History  Problem Relation Age of Onset  . Stroke Mother     onset in 91s  . Diabetes Mother   . Cancer Mother     bladder; nephrectomy for calculi/also uterine , TAH & BSO  . Aneurysm Mother     thoracic  . Depression Mother   . Stroke Brother   . Heart failure Brother   . Heart attack Other     maternal family history  . Heart failure Maternal Grandfather   . Lung cancer Father   . Heart failure Sister   . Depression Sister   . Alzheimer's disease Sister   . COPD Sister   . Aneurysm Brother     cns  . Depression Maternal Aunt     X2  . Bipolar disorder Sister   . Alcoholism Neg Hx    History  Substance Use Topics  . Smoking status: Never Smoker   . Smokeless tobacco: Never Used  . Alcohol Use: No   OB History    No data available     Review of Systems  All other systems reviewed and are negative.     Allergies  Codeine and Verapamil  Home Medications   Prior to Admission medications  Medication Sig Start Date End Date Taking? Authorizing Provider  acetaminophen (TYLENOL) 650 MG CR tablet Take 650 mg by mouth every 8 (eight) hours as needed. For pain    Historical Provider, MD  aspirin EC 81 MG tablet Take 81-162 mg by mouth daily.     Historical Provider, MD  azithromycin (ZITHROMAX) 250 MG tablet Take 1 tablet (250 mg total) by mouth daily. 11/23/14   Thurnell Lose, MD  Benzalkonium Chloride (DIABETIC BASICS HEALTHY FOOT EX) Apply 1 application topically daily as needed (foot pain). OTC diabetic foot cream    Historical Provider, MD  Blood Glucose Monitoring Suppl (TRUERESULT BLOOD GLUCOSE) W/DEVICE KIT Use to test blood sugar ICD 10 E11 9 07/14/14   Hendricks Limes, MD  buPROPion [facility]) 75 MG tablet take 1 tablet by mouth twice a day Patient not taking: Reported on 11/22/2014 08/12/14   Hendricks Limes, MD  Cholecalciferol (VITAMIN D PO) Take 1 tablet by mouth daily.    Historical Provider, MD   CRANBERRY PO Take 2 tablets by mouth daily.    Historical Provider, MD  dextromethorphan-guaiFENesin (TUSSIN DM) 10-100 MG/5ML liquid Take 10 mLs by mouth every 4 (four) hours as needed for cough. 11/23/14   Thurnell Lose, MD  fluticasone (FLOVENT HFA) 44 MCG/ACT inhaler inhale 1 to 2 puffs by mouth twice a day if needed 08/11/14   Hendricks Limes, MD  glucose blood (TRUETEST TEST) test strip Use to test blood sugar ICD 10 E11 9 07/14/14   Hendricks Limes, MD  loratadine (CLARITIN) 10 MG tablet Take 10 mg by mouth daily.    Historical Provider, MD  montelukast (SINGULAIR) 10 MG tablet Take 1 tablet (10 mg total) by mouth daily. Patient not taking: Reported on 11/22/2014 10/03/14   Hendricks Limes, MD  NASAL SPRAY SALINE NA Place 1-2 sprays into both nostrils daily.    Historical Provider, MD  pantoprazole (PROTONIX) 40 MG tablet Take 1 tablet (40 mg total) by mouth daily. 11/23/14   Thurnell Lose, MD  PROAIR HFA 108 [phone] BASE) MCG/ACT inhaler use as directed Patient not taking: Reported on 11/22/2014 06/06/14   Hendricks Limes, MD  salmeterol (SEREVENT DISKUS) 50 MCG/DOSE diskus inhaler inhale 1 puff orally twice a day 08/11/14   Hendricks Limes, MD  traMADol (ULTRAM) 50 MG tablet TAKE 1/2 TO 1 TABLET BY MOUTH EVERY 8 HOURS IF NEEDED. 12/02/14   Hendricks Limes, MD  TRUEPLUS LANCETS 28G MISC Use to test blood sugar ICD 10 E11 9 07/14/14   Hendricks Limes, MD  valsartan (DIOVAN) 40 MG tablet Take 1 tablet (40 mg total) by mouth daily. 11/23/14   Thurnell Lose, MD  vitamin C (ASCORBIC ACID) 500 MG tablet Take 500 mg by mouth daily.    Historical Provider, MD   BP 156/83 mmHg  Pulse 85  Temp(Src) 97.5 F (36.4 C) (Oral)  Resp 14  SpO2 95% Physical Exam  Constitutional: She is oriented to person, place, and time. She appears well-developed and well-nourished.  HENT:  Head: Normocephalic and atraumatic.  Cardiovascular: Normal rate and regular rhythm.   No murmur heard. 2+ radial pulses  in BUE.   Pulmonary/Chest: Effort normal and breath sounds normal. No respiratory distress.  Abdominal: Soft. There is no tenderness. There is no rebound and no guarding.  Musculoskeletal: She exhibits no edema or tenderness.  Diffuse posterior cervical tenderness, right lateral neck tenderness, pain with ROM of the neck.  No thoracic  or lumbar tenderness.  Lipoma over mid thoracic region.    Neurological: She is alert and oriented to person, place, and time.  5/5 strength in all four extremities.  Decreased sensation to light touch from knees down bilaterally.  2+ patellar reflexes bilaterally.    Skin: Skin is warm and dry.  Psychiatric: She has a normal mood and affect. Her behavior is normal.  Nursing note and vitals reviewed.   ED Course  Procedures (including critical care time) Labs Review Labs Reviewed  CBC - Abnormal; Notable for the following:    Hemoglobin 15.2 (*)    All other components within normal limits  BASIC METABOLIC PANEL - Abnormal; Notable for the following:    Glucose, Bld 119 (*)    BUN 21 (*)    Creatinine, Ser 1.26 (*)    GFR calc non Af Amer 38 (*)    GFR calc Af Amer 44 (*)    All other components within normal limits  URINALYSIS, ROUTINE W REFLEX MICROSCOPIC - Abnormal; Notable for the following:    APPearance CLOUDY (*)    Leukocytes, UA MODERATE (*)    All other components within normal limits  URINE MICROSCOPIC-ADD ON - Abnormal; Notable for the following:    Squamous Epithelial / LPF FEW (*)    All other components within normal limits  URINE CULTURE    Imaging Review Mr Cervical Spine Wo Contrast  12/12/2014   CLINICAL DATA:  81 year old female with head cervical spine in bilateral lower extremity pain since yesterday, increasing. Initial encounter.  EXAM: MRI CERVICAL SPINE WITHOUT CONTRAST  TECHNIQUE: Multiplanar, multisequence MR imaging of the cervical spine was performed. No intravenous contrast was administered.  COMPARISON:  Head CTs  02/08/2011 and earlier.  Chest CTA 11/22/2014.  FINDINGS: Multilevel spondylolisthesis in the cervical spine with diffuse advanced disc and endplate degeneration. Underlying straightening of cervical lordosis. 2-3 mm of anterolisthesis of C2 on C3. Mild retrolisthesis at C4-C5. 2-3 mm of anterolisthesis at C7-T1. Mild anterolisthesis also at T2-T3 and T3-T4 in the visualized upper thoracic spine. Abnormal appearance of the odontoid favored to represent bulky degenerative subchondral cysts (series 4, image 7). There appears to be trace anterior C1-C2 degenerative joint fluid. No definite marrow edema or acute osseous abnormality.  Cervicomedullary junction is within normal limits. Intracranial artery dolichoectasia partially visible. Chronic right medial cerebellar infarct with encephalomalacia (series 2, image 4). Other visible posterior fossa structures are within normal limits. No spinal cord signal abnormality identified.  Negative paraspinal soft tissues. Chronic posterior upper thoracic sebaceous cyst partially visible.  C2-C3: Anterolisthesis with moderate to severe facet hypertrophy. Pseudo disc. No spinal stenosis. Moderate to severe bilateral C3 foraminal stenosis.  C3-C4: Disc space loss and circumferential disc osteophyte complex. Broad-based posterior component. Borderline to mild spinal stenosis. Uncovertebral hypertrophy. Severe left and mild to moderate right C4 foraminal stenosis.  C4-C5: Disc space loss with circumferential disc osteophyte complex. Ligament flavum hypertrophy. Spinal stenosis with no definite spinal cord mass effect. Uncovertebral hypertrophy. Severe left and moderate to severe right C5 foraminal stenosis.  C5-C6: Disc space loss with circumferential disc osteophyte complex eccentric to the right. Ligament flavum hypertrophy. Spinal stenosis with no definite spinal cord mass effect. Uncovertebral hypertrophy. Moderate to severe bilateral C6 foraminal stenosis.  C6-C7: Disc space  loss with circumferential disc osteophyte complex eccentric to the right. Up to moderate ligament flavum hypertrophy. Spinal stenosis with mild spinal cord mass effect but no cord signal abnormality. Uncovertebral hypertrophy. Moderate left and severe right  C7 foraminal stenosis.  C7-T1: Anterolisthesis. Moderate to severe facet hypertrophy. Circumferential disc/pseudo disc. No significant spinal stenosis. Mild to moderate bilateral C8 foraminal stenosis.  No upper thoracic spinal stenosis or spinal cord signal abnormality.  IMPRESSION: 1. Widespread moderate to severe cervical spine degeneration. Multifactorial spinal stenosis from C3-C4 to C6-C7, with mild cord mass effect at the latter but no cord signal abnormality. 2. Multifactorial moderate or severe cervical neural foraminal stenosis at every cervical level. 3. Chronic medial right cerebellar hemisphere infarct with encephalomalacia. Intracranial artery dolichoectasia.   Electronically Signed   By: Genevie Ann M.D.   On: 12/12/2014 18:36     EKG Interpretation   Date/Time:  Monday Dec 12 2014 13:31:10 EDT Ventricular Rate:  80 PR Interval:  196 QRS Duration: 80 QT Interval:  386 QTC Calculation: 445 R Axis:   -33 Text Interpretation:  Sinus rhythm with Premature supraventricular  complexes Left axis deviation Anterior infarct , age undetermined Abnormal  ECG Confirmed by Hazle Coca [phone]) on 12/12/2014 3:41:52 PM      MDM   Final diagnoses:  Paresthesia of both lower extremities  Cervical stenosis of spine    Pt here for evaluation of paresthesias of lower extremities.  Paresthesias are resolved on recheck after neurontin.  Presentation not c/w dissection, CVA.  Pt does have neck pain for last two months - MRI demonstrates DJD and spinal stenosis, no evidence of acute cord compression.  Discussed with patient PCP as well as Neurosurgery follow up.  Return precautions were discussed.     Quintella Reichert, MD 12/13/14 [phone]

## 2014-12-12 NOTE — ED Notes (Signed)
Yesterday am she began to have had head, neck and leg pain and numbness in both legs. Symptoms have gotten worse since. No numbness in upper extremities. Grips = bilaterally. She is A&Ox4, resp e/u.

## 2014-12-12 NOTE — ED Notes (Signed)
MRI transport here patient requested medication for her to have MRI completed.

## 2014-12-12 NOTE — Discharge Instructions (Signed)
Paresthesia Paresthesia is an abnormal burning or prickling sensation. This sensation is generally felt in the hands, arms, legs, or feet. However, it may occur in any part of the body. It is usually not painful. The feeling may be described as:  Tingling or numbness.  "Pins and needles."  Skin crawling.  Buzzing.  Limbs "falling asleep."  Itching. Most people experience temporary (transient) paresthesia at some time in their lives. CAUSES  Paresthesia may occur when you breathe too quickly (hyperventilation). It can also occur without any apparent cause. Commonly, paresthesia occurs when pressure is placed on a nerve. The feeling quickly goes away once the pressure is removed. For some people, however, paresthesia is a long-lasting (chronic) condition caused by an underlying disorder. The underlying disorder may be:  A traumatic, direct injury to nerves. Examples include a:  Broken (fractured) neck.  Fractured skull.  A disorder affecting the brain and spinal cord (central nervous system). Examples include:  Transverse myelitis.  Encephalitis.  Transient ischemic attack.  Multiple sclerosis.  Stroke.  Tumor or blood vessel problems, such as an arteriovenous malformation pressing against the brain or spinal cord.  A condition that damages the peripheral nerves (peripheral neuropathy). Peripheral nerves are not part of the brain and spinal cord. These conditions include:  Diabetes.  Peripheral vascular disease.  Nerve entrapment syndromes, such as carpal tunnel syndrome.  Shingles.  Hypothyroidism.  Vitamin B12 deficiencies.  Alcoholism.  Heavy metal poisoning (lead, arsenic).  Rheumatoid arthritis.  Systemic lupus erythematosus. DIAGNOSIS  Your caregiver will attempt to find the underlying cause of your paresthesia. Your caregiver may:  Take your medical history.  Perform a physical exam.  Order various lab tests.  Order imaging tests. TREATMENT    Treatment for paresthesia depends on the underlying cause. HOME CARE INSTRUCTIONS  Avoid drinking alcohol.  You may consider massage or acupuncture to help relieve your symptoms.  Keep all follow-up appointments as directed by your caregiver. SEEK IMMEDIATE MEDICAL CARE IF:   You feel weak.  You have trouble walking or moving.  You have problems with speech or vision.  You feel confused.  You cannot control your bladder or bowel movements.  You feel numbness after an injury.  You faint.  Your burning or prickling feeling gets worse when walking.  You have pain, cramps, or dizziness.  You develop a rash. MAKE SURE YOU:  Understand these instructions.  Will watch your condition.  Will get help right away if you are not doing well or get worse. Document Released: 07/05/2002 Document Revised: 10/07/2011 Document Reviewed: 04/05/2011 [facility] Patient Information 2015 [place], [place]. This information is not intended to replace advice given to you by your health care provider. Make sure you discuss any questions you have with your health care provider.  Spinal Stenosis Spinal stenosis is an abnormal narrowing of the canals of your spine (vertebrae). CAUSES  Spinal stenosis is caused by areas of bone pushing into the central canals of your vertebrae. This condition can be present at birth (congenital). It also may be caused by arthritic deterioration of your vertebrae (spinal degeneration).  SYMPTOMS   Pain that is generally worse with activities, particularly standing and walking.  Numbness, tingling, hot or cold sensations, weakness, or weariness in your legs.  Frequent episodes of falling.  A foot-slapping gait that leads to muscle weakness. DIAGNOSIS  Spinal stenosis is diagnosed with the use of magnetic resonance imaging (MRI) or computed tomography (CT). TREATMENT  Initial therapy for spinal stenosis focuses on the  management of the pain and other symptoms  associated with the condition. These therapies include:  Practicing postural changes to lessen pressure on your nerves.  Exercises to strengthen the core of your body.  Loss of excess body weight.  The use of nonsteroidal anti-inflammatory medicines to reduce swelling and inflammation in your nerves. When therapies to manage pain are not successful, surgery to treat spinal stenosis may be recommended. This surgery involves removing excess bone, which puts pressure on your nerve roots. During this surgery (laminectomy), the posterior boney arch (lamina) and excess bone around the facet joints are removed. Document Released: 10/05/2003 Document Revised: 11/29/2013 Document Reviewed: 10/23/2012 [facility] Patient Information 2015 [place], [place]. This information is not intended to replace advice given to you by your health care provider. Make sure you discuss any questions you have with your health care provider.

## 2014-12-13 ENCOUNTER — Institutional Professional Consult (permissible substitution): Payer: Commercial Managed Care - HMO | Admitting: Internal Medicine

## 2014-12-13 ENCOUNTER — Encounter: Payer: Self-pay | Admitting: Internal Medicine

## 2014-12-13 ENCOUNTER — Ambulatory Visit (INDEPENDENT_AMBULATORY_CARE_PROVIDER_SITE_OTHER): Payer: Commercial Managed Care - HMO | Admitting: Internal Medicine

## 2014-12-13 VITALS — BP 130/78 | HR 88 | Temp 97.9°F | Wt 138.5 lb

## 2014-12-13 DIAGNOSIS — M5416 Radiculopathy, lumbar region: Secondary | ICD-10-CM

## 2014-12-13 DIAGNOSIS — M4802 Spinal stenosis, cervical region: Secondary | ICD-10-CM | POA: Diagnosis not present

## 2014-12-13 LAB — URINE CULTURE
COLONY COUNT: NO GROWTH
Culture: NO GROWTH

## 2014-12-13 MED ORDER — GABAPENTIN 100 MG PO CAPS: 100.0000 mg | ORAL_CAPSULE | Freq: Three times a day (TID) | ORAL | Status: DC

## 2014-12-13 NOTE — Patient Instructions (Signed)
Assess response to the gabapentin one every 8 hours as needed. If it is partially beneficial, it can be increased up to a total of 3 pills every 8 hours as needed. This increase of 1 pill each dose  should take place over 72 hours at least.If 300 mg is effective dose ; there is a 300 mg pill. 

## 2014-12-13 NOTE — Progress Notes (Signed)
Pre visit review using our clinic review tool, if applicable. No additional management support is needed unless otherwise documented below in the visit note. 

## 2014-12-13 NOTE — Progress Notes (Signed)
   Subjective:    Patient ID: Alexa [name], female    DOB: Jul 14, 1930, 81 y.o.   MRN: [id number]  HPI She was in the emergency room yesterday and placed on gabapentin for the pain and numbness in her legs present from the knees to the feet.  She also has pain in the right lateral neck with decreased range of motion and pain with range of cervical spine. An MRI of cervical spine showed degenerative cervical disc disease with spinal stenosis involving C3-C7.  There has been significant response to gabapentin. Today her pain is a 4-5 and described as "feeling heavy".   Review of Systems  She denies constitutional symptoms of fever or weight loss. She has had some chills and sweats.  She has exertional dyspnea which is chronic in the context of her COPD. She is not having incontinence of urine or stool.     Objective:   Physical Exam Pertinent or positive findings include: She appears dramatically younger than her stated age.  As she walks to the exam table her gait is somewhat broad and slapping.  She has decreased range of motion of the cervical spine.  She has an epidermoid inclusion cyst over the mid upper back. She has diffuse low-grade rhonchi and rales without increased work of breathing.  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 There is reverse curvature of the lumbosacral spine.  General appearance :adequately nourished; in no distress. Eyes: No conjunctival inflammation or scleral icterus is present. Heart:  Normal rate and regular rhythm. S1 and S2 normal without gallop, murmur, click, rub or other extra sounds   Abdomen: bowel sounds normal, soft and non-tender without masses, organomegaly or hernias noted.  No guarding or rebound. No flank tenderness to percussion. Vascular : all pulses equal ; no bruits present. Skin:Warm & dry.  Intact without suspicious lesions or rashes ; no tenting or jaundice  Lymphatic: No lymphadenopathy is noted about the head,  neck, axilla Neuro: Strength, tone & DTRs normal.        Assessment & Plan:  #1 cervical and probable lumbar stenosis  Plan: She states she has name of neurosurgeon to call. Pending that of a relation gabapentin will be titrated.

## 2014-12-27 ENCOUNTER — Telehealth: Payer: Self-pay | Admitting: Internal Medicine

## 2014-12-27 NOTE — Telephone Encounter (Signed)
Dr. Candiss Norse had changed patients lisinopril to Valsartan (I found this prescription of her old med list but she claims she is taking it instead of lisinopril). She is requesting a refill for the Valsartan. Pharmacy is [facility].

## 2014-12-27 NOTE — Telephone Encounter (Signed)
Change Lisinopril to Valsartan dose Rxed by Dr Candiss Norse please

## 2014-12-27 NOTE — Telephone Encounter (Signed)
Please advise, thanks.

## 2014-12-28 ENCOUNTER — Ambulatory Visit (INDEPENDENT_AMBULATORY_CARE_PROVIDER_SITE_OTHER): Payer: Commercial Managed Care - HMO | Admitting: Internal Medicine

## 2014-12-28 ENCOUNTER — Encounter: Payer: Self-pay | Admitting: Internal Medicine

## 2014-12-28 VITALS — BP 120/70 | HR 90 | Ht 62.5 in | Wt 139.4 lb

## 2014-12-28 DIAGNOSIS — J45991 Cough variant asthma: Secondary | ICD-10-CM | POA: Diagnosis not present

## 2014-12-28 DIAGNOSIS — R05 Cough: Secondary | ICD-10-CM

## 2014-12-28 DIAGNOSIS — I10 Essential (primary) hypertension: Secondary | ICD-10-CM

## 2014-12-28 DIAGNOSIS — R058 Other specified cough: Secondary | ICD-10-CM

## 2014-12-28 MED ORDER — MOMETASONE FURO-FORMOTEROL FUM 100-5 MCG/ACT IN AERO: INHALATION_SPRAY | RESPIRATORY_TRACT | Status: DC

## 2014-12-28 NOTE — Patient Instructions (Signed)
GERD (REFLUX)  is an extremely common cause of respiratory symptoms just like yours , many times with no obvious heartburn at all.    It can be treated with medication, but also with lifestyle changes including avoidance of late meals, elevation of the head of your bed (ideally with 6 inch  bed blocks) excessive alcohol, smoking cessation, and avoid fatty foods, chocolate, peppermint, colas, red wine, and acidic juices such as orange juice.  NO MINT OR MENTHOL PRODUCTS SO NO COUGH DROPS  USE SUGARLESS CANDY INSTEAD (Jolley ranchers or Stover's or Life Savers) or even ice chips will also do - the key is to swallow to prevent all throat clearing. NO OIL BASED VITAMINS - use powdered substitutes.   Stop all inhalers except: Plan A = automatic = dulera 100 Take 2 puffs first thing in am and then another 2 puffs about 12 hours later.   Plan B= backup Only use your albuterol (ventolin/proair) as a rescue medication to be used if you can't catch your breath by resting or doing a relaxed purse lip breathing pattern.  - The less you use it, the better it will work when you need it. - Ok to use up to 2 puffs  every 4 hours if you must but call for immediate appointment if use goes up over your usual need - Don't leave home without it !!  (think of it like the spare tire for your car)   See Tammy NP w/in 2 weeks with all your medications, even over the counter meds, separated in two separate bags, the ones you take no matter what vs the ones you stop once you feel better and take only as needed when you feel you need them.   Tammy  will generate for you a new user friendly medication calendar that will put [place] all on the same page re: your medication use.     Without this process, it simply isn't possible to assure that we are providing  your outpatient care  with  the attention to detail we feel you deserve.   If we cannot assure that you're getting that kind of care,  then we cannot manage your problem  effectively from this clinic.  Once you have seen Tammy and we are sure that we're all on the same page with your medication use she will arrange follow up with me.

## 2014-12-28 NOTE — Progress Notes (Signed)
Subjective:    Patient ID: Alexa [name], female    DOB: April 21, 1930,    MRN: [id number]  HPI  71 yowf never smoker onset allergies mostly upper airway symptoms in her 28s on shots then serevent added as maint rx in 1990s as maint rx referred p second admit with ? [facility]scp from coughing vs gerd better on neurontin and off acei and referrred 12/28/2014 to pulmonary clinic by [facility] for pulmonary eval ? Needs for asthma rx     12/28/2014 1st [name] office visit/ Alexa [name]   Chief Complaint  Patient presents with  . Pulmonary Consult    Referred by hospitalist at [facility]. Pt c/o cough for the past 2 yrs. Cough is currently prod with white sputum and is worse first thing in the am. Cough can be triggered by talking and laughing. She is SOB with exertion such as making her bed.   cough waxes and wanes x 2 years, day more night, with sensation of pnds and more mucus than usual but all white/ sev tsp q am  stopped acei 11/23/14  still on serevent and multiple sabas Very confused with maint vs prns   No obvious patterns in day to day or daytime variabilty or assoc  Ongoing  cp or chest tightness, subjective wheeze overt sinus or hb symptoms. No unusual exp hx or h/o childhood pna/ asthma or knowledge of premature birth.  Sleeping ok without nocturnal  or early am exacerbation  of respiratory  c/o's or need for noct saba. Also denies any obvious fluctuation of symptoms with weather or environmental changes or other aggravating or alleviating factors except as outlined above   Current Medications, Allergies, Complete Past Medical History, Past Surgical History, Family History, and Social History were reviewed in [employer] record.   .         Review of Systems  Constitutional: Negative for fever, chills and unexpected weight change.  HENT: Positive for postnasal drip, sinus pressure and sore throat. Negative for congestion, dental problem, ear pain, nosebleeds,  rhinorrhea, sneezing, trouble swallowing and voice change.   Eyes: Negative for visual disturbance.  Respiratory: Positive for cough and shortness of breath. Negative for choking.   Cardiovascular: Negative for chest pain and leg swelling.  Gastrointestinal: Negative for vomiting, abdominal pain and diarrhea.  Genitourinary: Negative for difficulty urinating.  Musculoskeletal: Negative for arthralgias.  Skin: Negative for rash.  Neurological: Positive for headaches. Negative for tremors and syncope.  Hematological: Does not bruise/bleed easily.       Objective:   Physical Exam  amb wf nad  Wt Readings from Last 3 Encounters:  12/28/14 139 lb 6.4 oz (63.231 kg)  12/13/14 138 lb 8 oz (62.823 kg)  11/23/14 137 lb 4.8 oz (62.279 kg)    Vital signs reviewed  HEENT: nl dentition, turbinates, and orophanx. Nl external ear canals without cough reflex   NECK :  without JVD/Nodes/TM/ nl carotid upstrokes bilaterally   LUNGS: no acc muscle use, clear to A and P bilaterally without cough on insp or exp maneuvers   CV:  RRR  no s3 or murmur or increase in P2, no edema   ABD:  soft and nontender with nl excursion in the supine position. No bruits or organomegaly, bowel sounds nl  MS:  warm without deformities, calf tenderness, cyanosis or clubbing  SKIN: warm and dry without lesions    NEURO:  alert, approp, no deficits     I personally reviewed  images and agree with radiology impression as follows:  CXR:  11/22/14 1. No evidence of pulmonary embolism. 2. Mild bronchitis. (mucus impaction RLL)         Assessment & Plan:

## 2014-12-29 ENCOUNTER — Encounter: Payer: Self-pay | Admitting: Internal Medicine

## 2014-12-29 ENCOUNTER — Other Ambulatory Visit: Payer: Self-pay

## 2014-12-29 DIAGNOSIS — R058 Other specified cough: Secondary | ICD-10-CM | POA: Insufficient documentation

## 2014-12-29 DIAGNOSIS — R05 Cough: Secondary | ICD-10-CM | POA: Insufficient documentation

## 2014-12-29 DIAGNOSIS — J45991 Cough variant asthma: Secondary | ICD-10-CM | POA: Insufficient documentation

## 2014-12-29 MED ORDER — VALSARTAN 40 MG PO TABS: 40.0000 mg | ORAL_TABLET | Freq: Every day | ORAL | Status: DC

## 2014-12-29 NOTE — Assessment & Plan Note (Signed)
D/c'd acei  10/2714 due to cough/ ? Pseudoasthma  Adequate control on present rx, reviewed > no change in rx needed .  ACE inhibitors are problematic in  pts with airway complaints because  even experienced pulmonologists can't always distinguish ace effects from copd/asthma.  By themselves they don't actually cause a problem, much like oxygen can't by itself start a fire, but they certainly serve as a powerful catalyst or enhancer for any "fire"  or inflammatory process in the upper airway, be it caused by an ET  tube or more commonly reflux (especially in the obese or pts with known GERD or who are on biphoshonates).    In the era of ARB near equivalency  I would avoid rechallenging with acei unless some compelling reason to do so but only after her chronic resp complaints have been fully addressed and resolved to the extent possible.

## 2014-12-29 NOTE — Assessment & Plan Note (Addendum)
Most of her symptoms that are difficult to control are upper airway  (see sep a/p) but based on her hx and dependency on saba she likely does have element of cough variant asthma which has been difficult to control for sev years   DDX of  difficult airways management all start with A and  include Adherence, Ace Inhibitors, Acid Reflux, Active Sinus Disease, Alpha 1 Antitripsin deficiency, Anxiety masquerading as Airways dz,  ABPA,  allergy(esp in young), Aspiration (esp in elderly), Adverse effects of DPI,  Active smokers, plus two Bs  = Bronchiectasis and Beta blocker use..and one C= CHF  Adherence is always the initial "prime suspect" and is a multilayered concern that requires a "trust but verify" approach in every patient - starting with knowing how to use medications, especially inhalers, correctly, keeping up with refills and understanding the fundamental difference between maintenance and prns vs those medications only taken for a very short course and then stopped and not refilled.  -The proper method of use, as well as anticipated side effects, of a metered-dose inhaler are discussed and demonstrated to the patient. Improved effectiveness after extensive coaching during this visit to a level of approximately  75% though note baseline < 25%  - desperately needs med reconciliation:  To keep things simple, I have asked the patient to first separate medicines that are perceived as maintenance, that is to be taken daily "no matter what", from those medicines that are taken on only on an as-needed basis and I have given the patient examples of both, and then return to see our NP to generate a  detailed  medication calendar which should be followed until the next physician sees the patient and updates it.     ? Acid (or non-acid) GERD > always difficult to exclude as up to 75% of pts in some series report no assoc GI/ Heartburn symptoms> rec max (24h)  acid suppression and diet restrictions/ reviewed  and instructions given in writing.  ? Adverse effects of dpi, esp serevent > try dulera 100 2bid   ? acei effects still lingering > remain off acei    Each maintenance medication was reviewed in detail including most importantly the difference between maintenance and as needed and under what circumstances the prns are to be used.  Please see instructions for details which were reviewed in writing and the patient given a copy.    ? Allergies> consider eval after we get her off acei x 6 full weeks and p dulera 100 trial x 2 weeks

## 2014-12-29 NOTE — Assessment & Plan Note (Signed)
The most common causes of chronic cough in immunocompetent adults include the following: upper airway cough syndrome (UACS), previously referred to as postnasal drip syndrome (PNDS), which is caused by variety of rhinosinus conditions; (2) asthma; (3) GERD; (4) chronic bronchitis from cigarette smoking or other inhaled environmental irritants; (5) nonasthmatic eosinophilic bronchitis; and (6) bronchiectasis.   These conditions, singly or in combination, have accounted for up to 94% of the causes of chronic cough in prospective studies.   Other conditions have constituted no >6% of the causes in prospective studies These have included bronchogenic carcinoma, chronic interstitial pneumonia, sarcoidosis, left ventricular failure, ACEI-induced cough, and aspiration from a condition associated with pharyngeal dysfunction.    Chronic cough is often simultaneously caused by more than one condition. A single cause has been found from 38 to 82% of the time, multiple causes from 18 to 62%. Multiply caused cough has been the result of three diseases up to 42% of the time.       Based on hx and exam, this is most likely:  Classic Upper airway cough syndrome, so named because it's frequently impossible to sort out how much is  CR/sinusitis with freq throat clearing (which can be related to primary GERD)   vs  causing  secondary (" extra esophageal")  GERD from wide swings in gastric pressure that occur with throat clearing, often  promoting self use of mint and menthol lozenges that reduce the lower esophageal sphincter tone and exacerbate the problem further in a cyclical fashion.   These are the same pts (now being labeled as having "irritable larynx syndrome" by some cough centers) who not infrequently have a history of having failed to tolerate ace inhibitors,  dry powder inhalers (both may apply here) or biphosphonates or report having atypical reflux symptoms that don't respond to standard doses of PPI , and  are easily confused as having aecopd or asthma flares by even experienced allergists/ pulmonologists.   The first step is to maximize acid suppression/ gerd diet  and eliminate  acei indefintely    See instructions for specific recommendations which were reviewed directly with the patient who was given a copy with highlighter outlining the key components.

## 2015-01-11 ENCOUNTER — Telehealth: Payer: Self-pay | Admitting: Emergency Medicine

## 2015-01-11 NOTE — Telephone Encounter (Signed)
OK X1 

## 2015-01-11 NOTE — Telephone Encounter (Signed)
Rx refill request from pharm for Tramadol. Please advise. Last OV 5/16

## 2015-01-12 ENCOUNTER — Other Ambulatory Visit: Payer: Self-pay | Admitting: Emergency Medicine

## 2015-01-12 MED ORDER — TRAMADOL HCL 50 MG PO TABS: ORAL_TABLET | ORAL | Status: DC

## 2015-01-18 DIAGNOSIS — H04123 Dry eye syndrome of bilateral lacrimal glands: Secondary | ICD-10-CM | POA: Diagnosis not present

## 2015-01-18 DIAGNOSIS — H3531 Nonexudative age-related macular degeneration: Secondary | ICD-10-CM | POA: Diagnosis not present

## 2015-01-18 DIAGNOSIS — H524 Presbyopia: Secondary | ICD-10-CM | POA: Diagnosis not present

## 2015-01-18 DIAGNOSIS — Z961 Presence of intraocular lens: Secondary | ICD-10-CM | POA: Diagnosis not present

## 2015-01-24 DIAGNOSIS — Z0100 Encounter for examination of eyes and vision without abnormal findings: Secondary | ICD-10-CM | POA: Diagnosis not present

## 2015-01-24 DIAGNOSIS — H52209 Unspecified astigmatism, unspecified eye: Secondary | ICD-10-CM | POA: Diagnosis not present

## 2015-01-24 DIAGNOSIS — H5213 Myopia, bilateral: Secondary | ICD-10-CM | POA: Diagnosis not present

## 2015-01-24 DIAGNOSIS — H524 Presbyopia: Secondary | ICD-10-CM | POA: Diagnosis not present

## 2015-01-27 ENCOUNTER — Telehealth: Payer: Self-pay | Admitting: Emergency Medicine

## 2015-01-27 NOTE — Telephone Encounter (Signed)
LVM for pt to call back. Pt needs OV if still having a cough.

## 2015-01-27 NOTE — Telephone Encounter (Signed)
If cough persisting , OV needed

## 2015-01-27 NOTE — Telephone Encounter (Signed)
Refill request from pharm for Tussin cough liquid, last OV 5/16, last refill 11/23/14 with 1 refill. Please advise

## 2015-02-14 ENCOUNTER — Telehealth: Payer: Self-pay | Admitting: Emergency Medicine

## 2015-02-14 NOTE — Telephone Encounter (Signed)
This was discontinued by Dr.Wert because of her chronic cough

## 2015-02-14 NOTE — Telephone Encounter (Signed)
Pharm refill request for Lisinopril, this medication is no longer on pts med list. Please advise.

## 2015-02-20 ENCOUNTER — Telehealth: Payer: Self-pay | Admitting: Internal Medicine

## 2015-02-20 NOTE — Telephone Encounter (Signed)
Pt left voicemail about est care with our office, called and left msg for pt to call us back to schedule appt and not to leave msg.

## 2015-02-21 ENCOUNTER — Ambulatory Visit (INDEPENDENT_AMBULATORY_CARE_PROVIDER_SITE_OTHER): Payer: Commercial Managed Care - HMO | Admitting: Internal Medicine

## 2015-02-21 ENCOUNTER — Encounter: Payer: Self-pay | Admitting: Internal Medicine

## 2015-02-21 VITALS — BP 148/88 | HR 76 | Temp 98.1°F | Resp 16 | Wt 142.0 lb

## 2015-02-21 DIAGNOSIS — M4802 Spinal stenosis, cervical region: Secondary | ICD-10-CM

## 2015-02-21 MED ORDER — AMITRIPTYLINE HCL 10 MG PO TABS: 10.0000 mg | ORAL_TABLET | Freq: Every day | ORAL | Status: DC

## 2015-02-21 NOTE — Progress Notes (Signed)
Pre visit review using our clinic review tool, if applicable. No additional management support is needed unless otherwise documented below in the visit note. 

## 2015-02-21 NOTE — Patient Instructions (Signed)
Stop bupropion and start amitriptyline 10 mg at bedtime.  If symptoms persist; neurosurgical consultation is indicated.

## 2015-02-21 NOTE — Progress Notes (Signed)
   Subjective:    Patient ID: Alexa [name], female    DOB: 02-Aug-1929, 81 y.o.   MRN: [id number]  HPI She describes neck and lower extremity pain for which she was seen in the emergency room 12/12/14. The pain in the legs was from the knees to the feet. The pain in her neck radiates superiorly over the crown.  MRI revealed degenerative joint disease and spinal stenosis from C3-4 to C6-7.  She was placed on gabapentin but she's been intolerant to this. She feels it is causing headaches & major depression. Tylenol has not been of benefit and caused constipation.  She takes BuSpiron 1-2 times daily.  She did not take her blood pressure pill today.  Review of Systems She is not having fever, chills, sweats, weight loss.  The numbness is mainly in the left lower extremity.  She denies any incontinence of urine or stool     Objective:   Physical Exam Pertinent or positive findings include: Upper partial is present.She has decreased range of motion of her cervical spine. Dry rales are suggested at the bases She has severe mixed arthritic changes in the hands with marked deformities.   General appearance :adequately nourished; in no distress.  Eyes: No conjunctival inflammation or scleral icterus is present.  Oral exam:  Lips and gums are healthy appearing.There is no oropharyngeal erythema or exudate noted. Dental hygiene is good.  Heart:  Normal rate and regular rhythm. S1 and S2 normal without gallop, murmur, click, rub or other extra sounds    Lungs:Chest clear to auscultation; no wheezes, rhonchi,rales ,or rubs present.No increased work of breathing.   Abdomen: bowel sounds normal, soft and non-tender without masses, organomegaly or hernias noted.  No guarding or rebound.   Vascular : all pulses equal ; no bruits present.  Skin:Warm & dry.  Intact without suspicious lesions or rashes ; no tenting or jaundice   Lymphatic: No lymphadenopathy is noted about the head, neck,  axilla  Neuro: Strength, tone decreased       Assessment & Plan:  #1 severe pain in the cervical pain and legs in the context of severe spinal stenosis.  Plan: Bupropion will be discontinued and low-dose amitriptyline initiated. NS referral if no better

## 2015-03-02 ENCOUNTER — Telehealth: Payer: Self-pay | Admitting: Emergency Medicine

## 2015-03-02 ENCOUNTER — Other Ambulatory Visit: Payer: Self-pay | Admitting: Emergency Medicine

## 2015-03-02 MED ORDER — TRAMADOL HCL 50 MG PO TABS: ORAL_TABLET | ORAL | Status: DC

## 2015-03-02 NOTE — Telephone Encounter (Signed)
Refill Request for Tramadol, Last OV 7/16. Please advise

## 2015-03-02 NOTE — Telephone Encounter (Signed)
OK X1  My retirement date is 07/29/2015; but I will be in office on a limited schedule Oct-Dec. To guarantee continuity of care you should transition your care to another PCP by Oct 1,2016.     

## 2015-03-14 ENCOUNTER — Ambulatory Visit: Payer: Self-pay | Admitting: Internal Medicine

## 2015-03-15 ENCOUNTER — Ambulatory Visit: Payer: Self-pay | Admitting: Internal Medicine

## 2015-03-16 ENCOUNTER — Ambulatory Visit: Payer: Self-pay | Admitting: Internal Medicine

## 2015-03-16 DIAGNOSIS — Z0289 Encounter for other administrative examinations: Secondary | ICD-10-CM

## 2015-03-18 ENCOUNTER — Emergency Department (HOSPITAL_BASED_OUTPATIENT_CLINIC_OR_DEPARTMENT_OTHER)
Admission: EM | Admit: 2015-03-18 | Discharge: 2015-03-18 | Discharge status: Against Medical Advice | Payer: Commercial Managed Care - HMO | Attending: Emergency Medicine | Admitting: Emergency Medicine

## 2015-03-18 ENCOUNTER — Encounter (HOSPITAL_BASED_OUTPATIENT_CLINIC_OR_DEPARTMENT_OTHER): Payer: Self-pay | Admitting: *Deleted

## 2015-03-18 DIAGNOSIS — J449 Chronic obstructive pulmonary disease, unspecified: Secondary | ICD-10-CM | POA: Insufficient documentation

## 2015-03-18 DIAGNOSIS — I10 Essential (primary) hypertension: Secondary | ICD-10-CM | POA: Insufficient documentation

## 2015-03-18 DIAGNOSIS — E119 Type 2 diabetes mellitus without complications: Secondary | ICD-10-CM | POA: Diagnosis not present

## 2015-03-18 DIAGNOSIS — G8929 Other chronic pain: Secondary | ICD-10-CM | POA: Insufficient documentation

## 2015-03-18 NOTE — ED Notes (Signed)
Pt reports chronic pain from spinal stenosis.  States that the pain became unbearable last night.  Pt ambulatory without distress.

## 2015-03-18 NOTE — ED Notes (Signed)
Pt called multiple times. Still not present in lobby

## 2015-03-18 NOTE — ED Notes (Signed)
Pt called by registration x 1 for signature. Not present in lobby at this time

## 2015-03-21 ENCOUNTER — Telehealth: Payer: Self-pay | Admitting: *Deleted

## 2015-03-21 NOTE — Telephone Encounter (Signed)
[place] [facility] - Client [facility] Patient Name: Alexa [name] Gender: Female DOB: [birth year] Age: 81 Y 3 M 28 D Return Phone Number: [phone] (Primary), [phone] (Secondary) Address: [address] City/State/Zip: [place] [address] Client [facility] - Client Client Site [facility] Day Physician [place], [place] Type Call Call Type Triage / Clinical Caller Name Tobias Alexander Relationship To Patient Daughter Appointment Disposition EMR Appointment Not Necessary Info pasted into Epic No Return Phone Number [phone] (Primary) Chief Complaint Nausea Initial Comment Caller states mother has nausea ,on other meds, needs something for nausea callled in PreDisposition Did not know what to do Nurse Assessment Nurse: Dimas Chyle, RN, Dellis Filbert Date/Time Eilene Ghazi Time): 03/18/2015 5:20:05 PM Confirm and document reason for call. If symptomatic, describe symptoms. ---Caller states mother has nausea, on other meds, needs something for nausea called in. Neck and head pain. Has had pain for 2 years. Has the patient traveled out of the country within the last 30 days? ---No Does the patient require triage? ---Yes Related visit to physician within the last 2 weeks? ---No Does the PT have any chronic conditions? (i.e. diabetes, asthma, etc.) ---No Guidelines Guideline Title Affirmed Question Affirmed Notes Nurse Date/Time Eilene Ghazi Time) Neck Pain or Stiffness [1] SEVERE neck pain (e.g., excruciating, unable to do any normal activities) AND [2] not improved after 2 hours of pain medicine Terrence Dupont 03/18/2015 5:22:46 PM Disp. Time Eilene Ghazi Time) Disposition Final User 03/18/2015 5:34:06 PM Pharmacy Call Dimas Chyle, RN, Dellis Filbert Reason: Per [facility] for nausea meds called in RX for Zofran to [facility] at [phone] 03/18/2015 5:27:01 PM See Physician within 4 Hours (or PCP  triage) Yes Dimas Chyle, RN, Dellis Filbert PLEASE NOTE: All timestamps contained within this report are represented as [place] Standard Time. CONFIDENTIALTY NOTICE: This fax transmission is intended only for the addressee. It contains information that is legally privileged, confidential or otherwise protected from use or disclosure. If you are not the intended recipient, you are strictly prohibited from reviewing, disclosing, copying using or disseminating any of this information or taking any action in reliance on or regarding this information. If you have received this fax in error, please notify us immediately by telephone so that we can arrange for its return to [place]. Phone: [phone], Toll-Free: [phone], Fax: [phone] Page: 2 of 3 Call Id: [id number] [place] Understands: Yes Disagree/Comply: Comply Care Advice Given Per Guideline SEE PHYSICIAN WITHIN 4 HOURS (or PCP triage): * IF OFFICE WILL BE OPEN: You need to be seen within the next 3 or 4 hours. Call your doctor's office now or as soon as it opens. PAIN MEDICINES: * For pain relief, take acetaminophen, ibuprofen, or naproxen. * Use the lowest amount that makes your pain feel better. ACETAMINOPHEN (E.G., TYLENOL): * Take 650 mg (two 325 mg pills) by mouth every 4-6 hours as needed. Each Regular Strength Tylenol pill has 325 mg of acetaminophen. The most you should take each day is 3,250 mg (10 Regular Strength pills a day). * Another choice is to take 1,000 mg (two 500 mg pills) every 8 hours as needed. Each Extra Strength Tylenol pill has 500 mg of acetaminophen. The most you should take each day is 3,000 mg (6 Extra Strength pills a day). IBUPROFEN (E.G., MOTRIN, ADVIL): * Take 400 mg (two 200 mg pills) by mouth every 6 hours as needed. * Another choice is to take 600 mg (three 200 mg pills) by mouth every  8 hours as needed. * The most you should take each day is 1,200 mg (six 200 mg pills a day), unless your doctor has told you to take  more. NAPROXEN (E.G., ALEVE): * Take 220 mg (one 220 mg pill) by mouth every 8 hours as needed. You may take 440 mg (two 220 mg pills) for your first dose. * The most you should take each day is 660 mg (three 220 mg pills a day), unless your doctor has told you to take more. CALL BACK IF: * You become worse. CARE ADVICE given per Neck Pain (Adult) guideline. After Care Instructions Given Call Event Type User Date / Time Description Standing Orders Preparation Additional Instructions Route Frequency Duration Nurse Comments User Name Zofran (regular tablet or ODT) 4mg  1 tablet # 6, ONLY if not able to make appt within recommended time frame Oral Every 8 Hours No refills Dimas Chyle, RN, Dellis Filbert Referrals GO TO FACILITY UNDECIDED

## 2015-03-22 ENCOUNTER — Telehealth: Payer: Self-pay | Admitting: Internal Medicine

## 2015-03-22 NOTE — Telephone Encounter (Signed)
Patient is requesting [facility] referral to Dr. Berenice Primas at [place].  Patient states they will not make appointment until they have referral.

## 2015-03-23 NOTE — Telephone Encounter (Signed)
Spoke w/pt. She is seeing Dr. Nelva Bush instead for her neck pain. Mcarthur Rossetti Josem Kaufmann #[id number] valid 03/23/2015 - 09/19/2015 for 6 visits

## 2015-03-29 DIAGNOSIS — M5032 Other cervical disc degeneration, mid-cervical region: Secondary | ICD-10-CM | POA: Diagnosis not present

## 2015-03-29 DIAGNOSIS — M542 Cervicalgia: Secondary | ICD-10-CM | POA: Diagnosis not present

## 2015-03-29 DIAGNOSIS — M5031 Other cervical disc degeneration,  high cervical region: Secondary | ICD-10-CM | POA: Diagnosis not present

## 2015-04-04 ENCOUNTER — Encounter: Payer: Self-pay | Admitting: Internal Medicine

## 2015-04-04 ENCOUNTER — Ambulatory Visit (INDEPENDENT_AMBULATORY_CARE_PROVIDER_SITE_OTHER): Payer: Commercial Managed Care - HMO | Admitting: Internal Medicine

## 2015-04-04 ENCOUNTER — Other Ambulatory Visit (INDEPENDENT_AMBULATORY_CARE_PROVIDER_SITE_OTHER): Payer: Commercial Managed Care - HMO

## 2015-04-04 VITALS — BP 148/82 | HR 106 | Temp 98.1°F | Resp 12 | Ht 62.5 in | Wt 140.4 lb

## 2015-04-04 DIAGNOSIS — Z86718 Personal history of other venous thrombosis and embolism: Secondary | ICD-10-CM

## 2015-04-04 DIAGNOSIS — E785 Hyperlipidemia, unspecified: Secondary | ICD-10-CM

## 2015-04-04 DIAGNOSIS — R739 Hyperglycemia, unspecified: Secondary | ICD-10-CM

## 2015-04-04 DIAGNOSIS — I10 Essential (primary) hypertension: Secondary | ICD-10-CM | POA: Diagnosis not present

## 2015-04-04 LAB — COMPREHENSIVE METABOLIC PANEL
ALT: 21 U/L (ref 0–35)
AST: 19 U/L (ref 0–37)
Albumin: 4.1 g/dL (ref 3.5–5.2)
Alkaline Phosphatase: 47 U/L (ref 39–117)
BUN: 20 mg/dL (ref 6–23)
CHLORIDE: 102 meq/L (ref 96–112)
CO2: 29 mEq/L (ref 19–32)
Calcium: 9.6 mg/dL (ref 8.4–10.5)
Creatinine, Ser: 1.08 mg/dL (ref 0.40–1.20)
GFR: 51.45 mL/min — ABNORMAL LOW (ref >60.00)
GLUCOSE: 111 mg/dL — AB (ref 70–99)
POTASSIUM: 4 meq/L (ref 3.5–5.1)
Sodium: 140 mEq/L (ref 135–145)
TOTAL PROTEIN: 7.2 g/dL (ref 6.0–8.3)
Total Bilirubin: 0.8 mg/dL (ref 0.2–1.2)

## 2015-04-04 LAB — CBC
HCT: 43.2 % (ref 36.0–46.0)
HEMOGLOBIN: 14.3 g/dL (ref 12.0–15.0)
MCHC: 33.1 g/dL (ref 30.0–36.0)
MCV: 88.1 fl (ref 78.0–100.0)
PLATELETS: 377 10*3/uL (ref 150.0–400.0)
RBC: 4.91 Mil/uL (ref 3.87–5.11)
RDW: 14 % (ref 11.5–15.5)
WBC: 9.2 10*3/uL (ref 4.0–10.5)

## 2015-04-04 NOTE — Patient Instructions (Signed)
We will check your labs and call you back with the results.   Come back next year for a physical and please feel free to call us back with problems or questions before then.

## 2015-04-04 NOTE — Progress Notes (Signed)
Pre visit review using our clinic review tool, if applicable. No additional management support is needed unless otherwise documented below in the visit note. 

## 2015-04-06 ENCOUNTER — Telehealth: Payer: Self-pay | Admitting: Emergency Medicine

## 2015-04-06 NOTE — Telephone Encounter (Signed)
Patient is also calling to request this

## 2015-04-06 NOTE — Telephone Encounter (Signed)
Refill request for Serevent Diskus. Do not see on current med list. Please advise.

## 2015-04-07 MED ORDER — SALMETEROL XINAFOATE 50 MCG/DOSE IN AEPB: INHALATION_SPRAY | RESPIRATORY_TRACT | Status: DC

## 2015-04-07 NOTE — Assessment & Plan Note (Signed)
Will need to review records to see if I can find year or circumstance. Not recurrent as far as the patient knows. Not anticoagulated.

## 2015-04-07 NOTE — Assessment & Plan Note (Signed)
Previous sugars borderline and not in the diabetic range. Will check today and if still elevated check HgA1c next blood draw.

## 2015-04-07 NOTE — Assessment & Plan Note (Signed)
Not due for recheck lipid panel. Taking omega-3 FA OTC. No anginal symptoms.

## 2015-04-07 NOTE — Assessment & Plan Note (Signed)
Taking valsartan, checking CMP today. BP at goal. Adjust as needed.

## 2015-04-07 NOTE — Telephone Encounter (Signed)
Servant has been sent to pharmacy,,,./lmb

## 2015-04-07 NOTE — Progress Notes (Signed)
   Subjective:    Patient ID: Alexa [name], female    DOB: 1929-12-31, 81 y.o.   MRN: [id number]  HPI The patient is an 81 YO female coming in for several medication questions as well as follow up of her blood pressure. She wonders if all her medicines are okay to take together and is not sure she needs all of them. She does take many supplements and we go over all of them together. She does take blood pressure medicine and has for many years. Denies any side effects from it. No chest pains, SOB, nausea. Denies headaches. No change to her dosage recently.   Review of Systems  Constitutional: Positive for activity change. Negative for fever, chills, appetite change, fatigue and unexpected weight change.  HENT: Negative.   Eyes: Negative.   Respiratory: Negative for cough, chest tightness, shortness of breath and wheezing.   Cardiovascular: Negative for chest pain, palpitations and leg swelling.  Gastrointestinal: Positive for diarrhea and constipation. Negative for nausea, abdominal pain and abdominal distention.       Alternating IBS  Musculoskeletal: Positive for back pain and arthralgias. Negative for myalgias and gait problem.  Skin: Negative.   Neurological: Positive for weakness and numbness. Negative for dizziness and light-headedness.  Psychiatric/Behavioral: Negative.       Objective:   Physical Exam  Constitutional: She is oriented to person, place, and time. She appears well-developed and well-nourished.  HENT:  Head: Normocephalic and atraumatic.  Eyes: EOM are normal.  Neck: Normal range of motion.  Cardiovascular: Normal rate and regular rhythm.   Pulmonary/Chest: Effort normal and breath sounds normal. No respiratory distress. She has no wheezes. She has no rales.  Abdominal: Soft. Bowel sounds are normal. She exhibits no distension. There is no tenderness. There is no rebound.  Musculoskeletal: She exhibits no edema.  Neurological: She is alert and oriented to person,  place, and time. Coordination abnormal.  Slow to rise and hesitating gait.  Skin: Skin is warm and dry.  Psychiatric: She has a normal mood and affect.   Filed Vitals:   04/04/15 1349  BP: 148/82  Pulse: 106  Temp: 98.1 F (36.7 C)  TempSrc: Oral  Resp: 12  Height: 5' 2.5" (1.588 m)  Weight: 140 lb 6.4 oz (63.685 kg)  SpO2: 96%      Assessment & Plan:  Visit time 25 minutes: greater than 50% of that was spent in coordination of care and directly with the patient counseling her on her medications and her various nutritional supplements (looking several up for interactions and safety).

## 2015-04-10 ENCOUNTER — Telehealth: Payer: Self-pay | Admitting: Emergency Medicine

## 2015-04-10 MED ORDER — RANITIDINE HCL 150 MG PO CAPS: 150.0000 mg | ORAL_CAPSULE | Freq: Two times a day (BID) | ORAL | Status: DC

## 2015-04-10 NOTE — Telephone Encounter (Signed)
Refill request for Ranitidine, not on current med list. Please advise.

## 2015-04-10 NOTE — Telephone Encounter (Signed)
Rx sent in

## 2015-04-18 ENCOUNTER — Telehealth: Payer: Self-pay

## 2015-04-18 ENCOUNTER — Encounter: Payer: Self-pay | Admitting: Internal Medicine

## 2015-04-18 NOTE — Telephone Encounter (Signed)
Call to Alexa [name] who cancelled her apt with Dr. Doug Sou and can reschedule AWV if she so desires; Left contact information

## 2015-04-22 ENCOUNTER — Other Ambulatory Visit: Payer: Self-pay | Admitting: Internal Medicine

## 2015-04-24 ENCOUNTER — Telehealth: Payer: Self-pay | Admitting: Internal Medicine

## 2015-04-24 MED ORDER — BENZONATATE 100 MG PO CAPS: 100.0000 mg | ORAL_CAPSULE | Freq: Two times a day (BID) | ORAL | Status: DC | PRN

## 2015-04-24 NOTE — Telephone Encounter (Signed)
Have sent in Alexa [name]. With her high blood pressure would not recommend taking over the counter cold medicine.

## 2015-04-24 NOTE — Telephone Encounter (Signed)
Patient is having a really bad cough and is coughing up flem.  I have her scheduled for tomorrow afternoon. She is hoping you can send in a cough syrup for her because her cough is really bad Pharmacy is [facility] on [address]

## 2015-04-24 NOTE — Telephone Encounter (Signed)
OK X 1  My retirement date is 07/29/2015; but I will be in office on a limited schedule Oct-Dec. To guarantee continuity of care you should transition your care to another PCP ASAP    

## 2015-04-24 NOTE — Telephone Encounter (Signed)
Faxed script back to [facility]...lmb 

## 2015-04-24 NOTE — Telephone Encounter (Signed)
Please advise, thanks.

## 2015-04-25 ENCOUNTER — Ambulatory Visit: Payer: Self-pay | Admitting: Internal Medicine

## 2015-04-25 NOTE — Telephone Encounter (Signed)
Spoke with patient and cancelled her appt today, per Dr. Doug Sou. She will pick up the medication and call us in a few days if she is not feeling better.

## 2015-04-26 DIAGNOSIS — M5032 Other cervical disc degeneration, mid-cervical region: Secondary | ICD-10-CM | POA: Diagnosis not present

## 2015-05-18 ENCOUNTER — Other Ambulatory Visit: Payer: Self-pay | Admitting: Internal Medicine

## 2015-05-30 ENCOUNTER — Other Ambulatory Visit: Payer: Self-pay | Admitting: Internal Medicine

## 2015-05-30 NOTE — Telephone Encounter (Signed)
Please advise 

## 2015-05-31 NOTE — Telephone Encounter (Signed)
Printed and signed, please fax.  

## 2015-06-01 ENCOUNTER — Encounter: Payer: Self-pay | Admitting: Internal Medicine

## 2015-06-01 ENCOUNTER — Other Ambulatory Visit: Payer: Self-pay | Admitting: Geriatric Medicine

## 2015-06-01 MED ORDER — GABAPENTIN 100 MG PO CAPS: 100.0000 mg | ORAL_CAPSULE | Freq: Three times a day (TID) | ORAL | Status: DC

## 2015-06-05 ENCOUNTER — Ambulatory Visit (INDEPENDENT_AMBULATORY_CARE_PROVIDER_SITE_OTHER): Payer: Commercial Managed Care - HMO | Admitting: Internal Medicine

## 2015-06-05 ENCOUNTER — Encounter: Payer: Self-pay | Admitting: Internal Medicine

## 2015-06-05 VITALS — BP 130/86 | HR 87 | Temp 98.6°F | Resp 18 | Ht 61.0 in | Wt 142.0 lb

## 2015-06-05 DIAGNOSIS — R450 Nervousness: Secondary | ICD-10-CM | POA: Diagnosis not present

## 2015-06-05 DIAGNOSIS — K582 Mixed irritable bowel syndrome: Secondary | ICD-10-CM | POA: Diagnosis not present

## 2015-06-05 MED ORDER — SENNOSIDES-DOCUSATE SODIUM 8.6-50 MG PO TABS: 1.0000 | ORAL_TABLET | Freq: Every day | ORAL | Status: DC

## 2015-06-05 MED ORDER — BUSPIRONE HCL 5 MG PO TABS: 2.5000 mg | ORAL_TABLET | Freq: Every day | ORAL | Status: DC | PRN

## 2015-06-05 NOTE — Assessment & Plan Note (Signed)
Unclear that this represents true anxiety. Advised her to find a counselor to talk to about her concerns and fears. Rx for buspar and will try to avoid benzos with her since she is also on opioid therapy for pain. Given strategies for stress prevention and management.

## 2015-06-05 NOTE — Assessment & Plan Note (Signed)
Possibly her opioids are contributing to her constipation. Talked to her about daily senokot-d for keeping her bowels moving and not getting constipated.

## 2015-06-05 NOTE — Patient Instructions (Addendum)
We have sent in the senokot-d to your pharmacy. Take 1 pill daily. If no improvement in 5 days start taking the medicine twice a day to help.  We have sent in buspirone for the anxiety which you can take daily as needed for anxiety. It may be worthwhile to talk to a counselor as they can usually help with coping skills to decrease your anxiety overall without medicines and make you feel better all the time.   Call your insurance company to see if any of these inhalers would be cheaper than the serevent: breo, anoro, advair.  Stress and Stress Management Stress is a normal reaction to life events. It is what you feel when life demands more than you are used to or more than you can handle. Some stress can be useful. For example, the stress reaction can help you catch the last bus of the day, study for a test, or meet a deadline at work. But stress that occurs too often or for too long can cause problems. It can affect your emotional health and interfere with relationships and normal daily activities. Too much stress can weaken your immune system and increase your risk for physical illness. If you already have a medical problem, stress can make it worse. CAUSES  All sorts of life events may cause stress. An event that causes stress for one person may not be stressful for another person. Major life events commonly cause stress. These may be positive or negative. Examples include losing your job, moving into a new home, getting married, having a baby, or losing a loved one. Less obvious life events may also cause stress, especially if they occur day after day or in combination. Examples include working long hours, driving in traffic, caring for children, being in debt, or being in a difficult relationship. SIGNS AND SYMPTOMS Stress may cause emotional symptoms including, the following:  Anxiety. This is feeling worried, afraid, on edge, overwhelmed, or out of control.  Anger. This is feeling irritated or  impatient.  Depression. This is feeling sad, down, helpless, or guilty.  Difficulty focusing, remembering, or making decisions. Stress may cause physical symptoms, including the following:   Aches and pains. These may affect your head, neck, back, stomach, or other areas of your body.  Tight muscles or clenched jaw.  Low energy or trouble sleeping. Stress may cause unhealthy behaviors, including the following:   Eating to feel better (overeating) or skipping meals.  Sleeping too little, too much, or both.  Working too much or putting off tasks (procrastination).  Smoking, drinking alcohol, or using drugs to feel better. DIAGNOSIS  Stress is diagnosed through an assessment by your health care provider. Your health care provider will ask questions about your symptoms and any stressful life events.Your health care provider will also ask about your medical history and may order blood tests or other tests. Certain medical conditions and medicine can cause physical symptoms similar to stress. Mental illness can cause emotional symptoms and unhealthy behaviors similar to stress. Your health care provider may refer you to a mental health professional for further evaluation.  TREATMENT  Stress management is the recommended treatment for stress.The goals of stress management are reducing stressful life events and coping with stress in healthy ways.  Techniques for reducing stressful life events include the following:  Stress identification. Self-monitor for stress and identify what causes stress for you. These skills may help you to avoid some stressful events.  Time management. Set your priorities, keep a  calendar of events, and learn to say "no." These tools can help you avoid making too many commitments. Techniques for coping with stress include the following:  Rethinking the problem. Try to think realistically about stressful events rather than ignoring them or overreacting. Try to find  the positives in a stressful situation rather than focusing on the negatives.  Exercise. Physical exercise can release both physical and emotional tension. The key is to find a form of exercise you enjoy and do it regularly.  Relaxation techniques. These relax the body and mind. Examples include yoga, meditation, tai chi, biofeedback, deep breathing, progressive muscle relaxation, listening to music, being out in nature, journaling, and other hobbies. Again, the key is to find one or more that you enjoy and can do regularly.  Healthy lifestyle. Eat a balanced diet, get plenty of sleep, and do not smoke. Avoid using alcohol or drugs to relax.  Strong support network. Spend time with family, friends, or other people you enjoy being around.Express your feelings and talk things over with someone you trust. Counseling or talktherapy with a mental health professional may be helpful if you are having difficulty managing stress on your own. Medicine is typically not recommended for the treatment of stress.Talk to your health care provider if you think you need medicine for symptoms of stress. HOME CARE INSTRUCTIONS  Keep all follow-up visits as directed by your health care provider.  Take all medicines as directed by your health care provider. SEEK MEDICAL CARE IF:  Your symptoms get worse or you start having new symptoms.  You feel overwhelmed by your problems and can no longer manage them on your own. SEEK IMMEDIATE MEDICAL CARE IF:  You feel like hurting yourself or someone else.   This information is not intended to replace advice given to you by your health care provider. Make sure you discuss any questions you have with your health care provider.   Document Released: 01/08/2001 Document Revised: 08/05/2014 Document Reviewed: 03/09/2013 Elsevier Interactive Patient Education [employer].

## 2015-06-05 NOTE — Progress Notes (Signed)
Pre visit review using our clinic review tool, if applicable. No additional management support is needed unless otherwise documented below in the visit note. 

## 2015-06-05 NOTE — Progress Notes (Signed)
   Subjective:    Patient ID: Alexa [name], female    DOB: 09-08-1929, 81 y.o.   MRN: [id number]  HPI The patient is an 81 YO female coming in with several new concerns today. She is feeling nervous more often. Especially when she needs to leave the house quickly. She denies panic attacks. She feels that there is more going on right now and she is thinking about a lot of stuff. Has taken clonazepam in the past and was taken off for concern about side effects by her previous PCP. She denies any problems with taking 1 mg of it. Also having sleeping problems. We did not get to discuss as she was on to other problems and we did not return. Next new concern is her constipation. We have talked about this before and I did advise miralax daily which she is not doing. She takes a dulcolax when she has not gone for several days and it usually takes several days to work. Denies abdominal pain, still passing gas. She does have several other concerns but were not able to address as we were focusing our time on her most important concerns.   Review of Systems  Constitutional: Positive for activity change. Negative for fever, chills, appetite change, fatigue and unexpected weight change.  HENT: Negative.   Eyes: Negative.   Respiratory: Negative for cough, chest tightness, shortness of breath and wheezing.   Cardiovascular: Negative for chest pain, palpitations and leg swelling.  Gastrointestinal: Positive for diarrhea and constipation. Negative for nausea, abdominal pain and abdominal distention.       Alternating IBS  Musculoskeletal: Positive for back pain and arthralgias. Negative for myalgias and gait problem.  Skin: Negative.   Neurological: Positive for weakness and numbness. Negative for dizziness and light-headedness.  Psychiatric/Behavioral: Positive for dysphoric mood. The patient is nervous/anxious.       Objective:   Physical Exam  Constitutional: She is oriented to person, place, and time. She  appears well-developed and well-nourished.  HENT:  Head: Normocephalic and atraumatic.  Eyes: EOM are normal.  Neck: Normal range of motion.  Cardiovascular: Normal rate and regular rhythm.   Pulmonary/Chest: Effort normal and breath sounds normal. No respiratory distress. She has no wheezes. She has no rales.  Abdominal: Soft. Bowel sounds are normal. She exhibits no distension. There is no tenderness. There is no rebound.  Musculoskeletal: She exhibits no edema.  Neurological: She is alert and oriented to person, place, and time. Coordination abnormal.  Hesitating gait.  Skin: Skin is warm and dry.  Psychiatric: She has a normal mood and affect.   Filed Vitals:   06/05/15 1112  BP: 130/86  Pulse: 87  Temp: 98.6 F (37 C)  TempSrc: Oral  Resp: 18  Height: 5\' 1"  (1.549 m)  Weight: 142 lb (64.411 kg)  SpO2: 95%      Assessment & Plan:

## 2015-07-02 ENCOUNTER — Other Ambulatory Visit: Payer: Self-pay | Admitting: Internal Medicine

## 2015-07-03 DIAGNOSIS — B9620 Unspecified Escherichia coli [E. coli] as the cause of diseases classified elsewhere: Secondary | ICD-10-CM | POA: Diagnosis not present

## 2015-07-03 DIAGNOSIS — N390 Urinary tract infection, site not specified: Secondary | ICD-10-CM | POA: Diagnosis not present

## 2015-07-03 DIAGNOSIS — Z905 Acquired absence of kidney: Secondary | ICD-10-CM | POA: Diagnosis not present

## 2015-07-03 DIAGNOSIS — N952 Postmenopausal atrophic vaginitis: Secondary | ICD-10-CM | POA: Diagnosis not present

## 2015-07-03 DIAGNOSIS — N3946 Mixed incontinence: Secondary | ICD-10-CM | POA: Diagnosis not present

## 2015-07-05 ENCOUNTER — Telehealth: Payer: Self-pay | Admitting: Internal Medicine

## 2015-07-05 NOTE — Telephone Encounter (Signed)
Patient is requesting a refill of traMADol (ULTRAM) 50 MG tablet [id number]

## 2015-07-06 ENCOUNTER — Emergency Department (HOSPITAL_COMMUNITY)
Admission: EM | Admit: 2015-07-06 | Discharge: 2015-07-06 | Disposition: A | Discharge status: Home / Self Care | Payer: Commercial Managed Care - HMO | Attending: Emergency Medicine | Admitting: Emergency Medicine

## 2015-07-06 ENCOUNTER — Telehealth: Payer: Self-pay | Admitting: Internal Medicine

## 2015-07-06 ENCOUNTER — Emergency Department (HOSPITAL_COMMUNITY): Payer: Commercial Managed Care - HMO

## 2015-07-06 ENCOUNTER — Encounter (HOSPITAL_COMMUNITY): Payer: Self-pay | Admitting: Emergency Medicine

## 2015-07-06 DIAGNOSIS — K219 Gastro-esophageal reflux disease without esophagitis: Secondary | ICD-10-CM | POA: Diagnosis not present

## 2015-07-06 DIAGNOSIS — Z8739 Personal history of other diseases of the musculoskeletal system and connective tissue: Secondary | ICD-10-CM | POA: Insufficient documentation

## 2015-07-06 DIAGNOSIS — Z86718 Personal history of other venous thrombosis and embolism: Secondary | ICD-10-CM | POA: Diagnosis not present

## 2015-07-06 DIAGNOSIS — Z7951 Long term (current) use of inhaled steroids: Secondary | ICD-10-CM | POA: Insufficient documentation

## 2015-07-06 DIAGNOSIS — Z7982 Long term (current) use of aspirin: Secondary | ICD-10-CM | POA: Diagnosis not present

## 2015-07-06 DIAGNOSIS — R05 Cough: Secondary | ICD-10-CM | POA: Insufficient documentation

## 2015-07-06 DIAGNOSIS — Z86711 Personal history of pulmonary embolism: Secondary | ICD-10-CM | POA: Insufficient documentation

## 2015-07-06 DIAGNOSIS — Z86010 Personal history of colonic polyps: Secondary | ICD-10-CM | POA: Insufficient documentation

## 2015-07-06 DIAGNOSIS — Z85528 Personal history of other malignant neoplasm of kidney: Secondary | ICD-10-CM | POA: Insufficient documentation

## 2015-07-06 DIAGNOSIS — R531 Weakness: Secondary | ICD-10-CM | POA: Diagnosis not present

## 2015-07-06 DIAGNOSIS — I10 Essential (primary) hypertension: Secondary | ICD-10-CM | POA: Insufficient documentation

## 2015-07-06 DIAGNOSIS — N390 Urinary tract infection, site not specified: Secondary | ICD-10-CM | POA: Diagnosis not present

## 2015-07-06 DIAGNOSIS — E119 Type 2 diabetes mellitus without complications: Secondary | ICD-10-CM | POA: Insufficient documentation

## 2015-07-06 DIAGNOSIS — J449 Chronic obstructive pulmonary disease, unspecified: Secondary | ICD-10-CM | POA: Insufficient documentation

## 2015-07-06 DIAGNOSIS — Z79899 Other long term (current) drug therapy: Secondary | ICD-10-CM | POA: Diagnosis not present

## 2015-07-06 LAB — URINALYSIS, ROUTINE W REFLEX MICROSCOPIC
Bilirubin Urine: NEGATIVE
GLUCOSE, UA: NEGATIVE mg/dL
Hgb urine dipstick: NEGATIVE
Ketones, ur: NEGATIVE mg/dL
Nitrite: POSITIVE — AB
PH: 6 (ref 5.0–8.0)
Protein, ur: NEGATIVE mg/dL
SPECIFIC GRAVITY, URINE: 1.008 (ref 1.005–1.030)

## 2015-07-06 LAB — URINE MICROSCOPIC-ADD ON

## 2015-07-06 LAB — CBC WITH DIFFERENTIAL/PLATELET
BASOS ABS: 0.1 10*3/uL (ref 0.0–0.1)
Basophils Relative: 1 %
Eosinophils Absolute: 0.8 10*3/uL — ABNORMAL HIGH (ref 0.0–0.7)
Eosinophils Relative: 9 %
HEMATOCRIT: 46.9 % — AB (ref 36.0–46.0)
HEMOGLOBIN: 15.5 g/dL — AB (ref 12.0–15.0)
LYMPHS PCT: 28 %
Lymphs Abs: 2.4 10*3/uL (ref 0.7–4.0)
MCH: 29.8 pg (ref 26.0–34.0)
MCHC: 33 g/dL (ref 30.0–36.0)
MCV: 90 fL (ref 78.0–100.0)
Monocytes Absolute: 0.7 10*3/uL (ref 0.1–1.0)
Monocytes Relative: 8 %
NEUTROS ABS: 4.7 10*3/uL (ref 1.7–7.7)
NEUTROS PCT: 54 %
PLATELETS: 321 10*3/uL (ref 150–400)
RBC: 5.21 MIL/uL — AB (ref 3.87–5.11)
RDW: 13.7 % (ref 11.5–15.5)
WBC: 8.6 10*3/uL (ref 4.0–10.5)

## 2015-07-06 LAB — BASIC METABOLIC PANEL
ANION GAP: 10 (ref 5–15)
BUN: 22 mg/dL — ABNORMAL HIGH (ref 6–20)
CHLORIDE: 105 mmol/L (ref 101–111)
CO2: 25 mmol/L (ref 22–32)
Calcium: 9.4 mg/dL (ref 8.9–10.3)
Creatinine, Ser: 1.09 mg/dL — ABNORMAL HIGH (ref 0.44–1.00)
GFR calc Af Amer: 53 mL/min — ABNORMAL LOW (ref >60)
GFR, EST NON AFRICAN AMERICAN: 46 mL/min — AB (ref >60)
Glucose, Bld: 144 mg/dL — ABNORMAL HIGH (ref 65–99)
POTASSIUM: 4.2 mmol/L (ref 3.5–5.1)
SODIUM: 140 mmol/L (ref 135–145)

## 2015-07-06 MED ORDER — CEFPODOXIME PROXETIL 100 MG PO TABS: 100.0000 mg | ORAL_TABLET | Freq: Two times a day (BID) | ORAL | Status: DC

## 2015-07-06 MED ORDER — TRAMADOL HCL 50 MG PO TABS: 50.0000 mg | ORAL_TABLET | Freq: Every day | ORAL | Status: DC | PRN

## 2015-07-06 NOTE — Telephone Encounter (Signed)
Patient Name: Alexa [name] DOB: 02/28/1930 Initial Comment Caller states has chest cold, getting worse; feels weak; coughing; Nurse Assessment Nurse: Marcelline Deist, RN, Lynda Date/Time (Eastern Time): 07/06/2015 11:54:09 AM Confirm and document reason for call. If symptomatic, describe symptoms. ---Caller states she has chest cold, getting worse. She feels weak. Has coughing. Has had symptoms on & off for a month. Has the patient traveled out of the country within the last 30 days? ---Not Applicable Does the patient have any new or worsening symptoms? ---Yes Will a triage be completed? ---Yes Related visit to physician within the last 2 weeks? ---No Does the PT have any chronic conditions? (i.e. diabetes, asthma, etc.) ---Yes List chronic conditions. ---asthma, COPD Is this a behavioral health or substance abuse call? ---No Guidelines Guideline Title Affirmed Question Affirmed Notes Breathing Difficulty [1] MODERATE difficulty breathing (e.g., speaks in phrases, SOB even at rest, pulse 100-120) AND [2] NEW-onset or WORSE than normal Final Disposition User Go to ED Now Marcelline Deist, RN, Lynda Comments Caller states she is having SOB, as well as profound weakness. Was able to get up & shower today, but feels like she could just "lay down & die". Referrals [facility] - ED Disagree/Comply: Comply

## 2015-07-06 NOTE — ED Notes (Signed)
Per pt, states she has had cold symptoms since getting flu and pnuemonia shot about a month ago-states symptoms on and off-states productive cough-states generalized weakness-

## 2015-07-06 NOTE — Telephone Encounter (Signed)
Faxed to pharmacy

## 2015-07-06 NOTE — Discharge Instructions (Signed)
Follow with your urologist tomorrow.Return to the ED if you get a fever are unable to eat or drinkare unable to take care of herself at home. Urinary Tract Infection Urinary tract infections (UTIs) can develop anywhere along your urinary tract. Your urinary tract is your body's drainage system for removing wastes and extra water. Your urinary tract includes two kidneys, two ureters, a bladder, and a urethra. Your kidneys are a pair of bean-shaped organs. Each kidney is about the size of your fist. They are located below your ribs, one on each side of your spine. CAUSES Infections are caused by microbes, which are microscopic organisms, including fungi, viruses, and bacteria. These organisms are so small that they can only be seen through a microscope. Bacteria are the microbes that most commonly cause UTIs. SYMPTOMS  Symptoms of UTIs may vary by age and gender of the patient and by the location of the infection. Symptoms in young women typically include a frequent and intense urge to urinate and a painful, burning feeling in the bladder or urethra during urination. Older women and men are more likely to be tired, shaky, and weak and have muscle aches and abdominal pain. A fever may mean the infection is in your kidneys. Other symptoms of a kidney infection include pain in your back or sides below the ribs, nausea, and vomiting. DIAGNOSIS To diagnose a UTI, your caregiver will ask you about your symptoms. Your caregiver will also ask you to provide a urine sample. The urine sample will be tested for bacteria and white blood cells. White blood cells are made by your body to help fight infection. TREATMENT  Typically, UTIs can be treated with medication. Because most UTIs are caused by a bacterial infection, they usually can be treated with the use of antibiotics. The choice of antibiotic and length of treatment depend on your symptoms and the type of bacteria causing your infection. HOME CARE  INSTRUCTIONS  If you were prescribed antibiotics, take them exactly as your caregiver instructs you. Finish the medication even if you feel better after you have only taken some of the medication.  Drink enough water and fluids to keep your urine clear or pale yellow.  Avoid caffeine, tea, and carbonated beverages. They tend to irritate your bladder.  Empty your bladder often. Avoid holding urine for long periods of time.  Empty your bladder before and after sexual intercourse.  After a bowel movement, women should cleanse from front to back. Use each tissue only once. SEEK MEDICAL CARE IF:   You have back pain.  You develop a fever.  Your symptoms do not begin to resolve within 3 days. SEEK IMMEDIATE MEDICAL CARE IF:   You have severe back pain or lower abdominal pain.  You develop chills.  You have nausea or vomiting.  You have continued burning or discomfort with urination. MAKE SURE YOU:   Understand these instructions.  Will watch your condition.  Will get help right away if you are not doing well or get worse.   This information is not intended to replace advice given to you by your health care provider. Make sure you discuss any questions you have with your health care provider.   Document Released: 04/24/2005 Document Revised: 04/05/2015 Document Reviewed: 08/23/2011 Elsevier Interactive Patient Education [employer].

## 2015-07-06 NOTE — ED Provider Notes (Signed)
CSN: [id number]     Arrival date & time 07/06/15  1229 History   First MD Initiated Contact with Patient 07/06/15 1503     Chief Complaint  Patient presents with  . Cough     (Consider location/radiation/quality/duration/timing/severity/associated sxs/prior Treatment) Patient is a 81 y.o. female presenting with cough. The history is provided by the patient and a relative.  Cough Cough characteristics:  Non-productive Severity:  Moderate Onset quality:  Gradual Duration:  4 weeks Timing:  Constant Progression:  Worsening Chronicity:  New Context: sick contacts   Relieved by:  Nothing Worsened by:  Nothing tried Ineffective treatments:  None tried Associated symptoms: no chest pain, no chills, no fever, no headaches, no myalgias, no rhinorrhea, no shortness of breath, no sinus congestion and no wheezing    81 yo f with a cc of weakness.  Going on for past month.  Worsening past week.  Had cough after being exposed to grandkids with similar illness.  Able to get around but having some more difficulty at home.  Denies fevers, chills, chest pain, sob.  Denies abdominal pain, vomiting.  Normal UA yesterday at nephrologist office.  Has appointment with them tomorrow.   Past Medical History  Diagnosis Date  . Chronic obstructive pulmonary disease ([place])   . Personal history of venous thrombosis and embolism     PTE after nephrectomy  . Esophageal reflux   . Irritable bowel syndrome   . Unspecified essential hypertension   . Other and unspecified hyperlipidemia   . Diverticulosis of colon (without mention of hemorrhage)   . Spinal stenosis   . History of colonic polyps 06/05/05    hyperplastic  . Diverticulosis   . Internal hemorrhoids   . Type II or unspecified type diabetes mellitus without mention of complication, not stated as uncontrolled 02/05/2012    pt states it was in 2007  . Ischemic colitis ([place])   . Asthma   . Malignant neoplasm of kidney and other and unspecified  urinary organs     renal carcinoma, left nephrectomy in 2008-per patient.   Past Surgical History  Procedure Laterality Date  . Septoplasty    . Tonsillectomy    . Breast biopsy    . Colonoscopy w/ polypectomy  2000  . Colonoscopy w/ polypectomy  07/2004    Hyperplastic polyps  . Dilation and curettage of uterus  03/2005    Uterine Polyps  . Nephrectomy  2007    right,Dr Dahlstedt   Family History  Problem Relation Age of Onset  . Stroke Mother     onset in 83s  . Diabetes Mother   . Cancer Mother     bladder; nephrectomy for calculi/also uterine , TAH & BSO  . Aneurysm Mother     thoracic  . Depression Mother   . Stroke Brother   . Heart failure Brother   . Heart attack Other     maternal family history  . Heart failure Maternal Grandfather   . Lung cancer Father     smoked  . Heart failure Sister   . Depression Sister   . Alzheimer's disease Sister   . COPD Sister   . Aneurysm Brother     cns  . Depression Maternal Aunt     X2  . Bipolar disorder Sister   . Alcoholism Neg Hx    Social History  Substance Use Topics  . Smoking status: Never Smoker   . Smokeless tobacco: Never Used  . Alcohol Use: No  OB History    No data available     Review of Systems  Constitutional: Negative for fever and chills.  HENT: Negative for congestion and rhinorrhea.   Eyes: Negative for redness and visual disturbance.  Respiratory: Positive for cough. Negative for shortness of breath and wheezing.   Cardiovascular: Negative for chest pain and palpitations.  Gastrointestinal: Negative for nausea and vomiting.  Genitourinary: Negative for dysuria and urgency.  Musculoskeletal: Negative for myalgias and arthralgias.  Skin: Negative for pallor and wound.  Neurological: Positive for weakness (generalized). Negative for dizziness and headaches.      Allergies  Lisinopril; Codeine; and Verapamil  Home Medications   Prior to Admission medications   Medication Sig Start  Date End Date Taking? Authorizing Provider  Ascorbic Acid (VITAMIN C) POWD Take 1 packet by mouth 2 (two) times daily as needed (cold symptoms).   Yes Historical Provider, MD  aspirin EC 81 MG tablet Take 162 mg by mouth daily.   Yes Historical Provider, MD  Benzalkonium Chloride (DIABETIC BASICS HEALTHY FOOT EX) Apply 1 application topically daily as needed (foot pain). OTC diabetic foot cream   Yes Historical Provider, MD  benzonatate (TESSALON) 100 MG capsule take 1 capsule by mouth twice a day if needed for cough 05/18/15  Yes Hoyt Koch, MD  busPIRone (BUSPAR) 5 MG tablet Take 0.5-1 tablets (2.5-5 mg total) by mouth daily as needed. 06/05/15  Yes Hoyt Koch, MD  CALCIUM PO Take 1 tablet by mouth daily.   Yes Historical Provider, MD  Cholecalciferol (VITAMIN D PO) Take 1 tablet by mouth daily.   Yes Historical Provider, MD  CRANBERRY PO Take 2 capsules by mouth 2 (two) times daily.    Yes Historical Provider, MD  Fluticasone Propionate (FLONASE NA) Place 1-2 sprays into the nose 2 (two) times daily as needed. 05/30/15  Yes Historical Provider, MD  gabapentin (NEURONTIN) 100 MG capsule Take 1 capsule (100 mg total) by mouth 3 (three) times daily. 06/01/15  Yes Hoyt Koch, MD  Multiple Vitamins-Minerals (EYE VITAMINS PO) Take by mouth.   Yes Historical Provider, MD  pantoprazole (PROTONIX) 40 MG tablet Take 1 tablet (40 mg total) by mouth daily. 11/23/14  Yes Thurnell Lose, MD  PROAIR HFA 108 [phone] BASE) MCG/ACT inhaler USE AS DIRECTED 07/03/15  Yes Hoyt Koch, MD  ranitidine (ZANTAC) 150 MG capsule Take 1 capsule (150 mg total) by mouth 2 (two) times daily. 04/10/15  Yes Hoyt Koch, MD  salmeterol (SEREVENT DISKUS) 50 MCG/DOSE diskus inhaler inhale 1 puff orally twice a day 04/07/15  Yes Hoyt Koch, MD  senna-docusate (SENOKOT-S) 8.6-50 MG tablet Take 1 tablet by mouth daily. 06/05/15  Yes Hoyt Koch, MD  traMADol (ULTRAM) 50 MG tablet  Take 1 tablet (50 mg total) by mouth daily as needed. 07/06/15  Yes Hoyt Koch, MD  valsartan (DIOVAN) 40 MG tablet Take 1 tablet (40 mg total) by mouth daily. 12/29/14  Yes Hendricks Limes, MD  Blood Glucose Monitoring Suppl (TRUERESULT BLOOD GLUCOSE) W/DEVICE KIT Use to test blood sugar ICD 10 E11 9 07/14/14   Hendricks Limes, MD  cefpodoxime (VANTIN) 100 MG tablet Take 1 tablet (100 mg total) by mouth 2 (two) times daily. 07/06/15   Deno Etienne, DO  cefpodoxime (VANTIN) 100 MG tablet Take 1 tablet (100 mg total) by mouth 2 (two) times daily. 07/06/15   Deno Etienne, DO  glucose blood (TRUETEST TEST) test strip Use to test  blood sugar ICD 10 E11 9 07/14/14   Hendricks Limes, MD  TRUEPLUS LANCETS 28G MISC Use to test blood sugar ICD 10 E11 9 07/14/14   Hendricks Limes, MD   BP 145/102 mmHg  Pulse 75  Temp(Src) 98.3 F (36.8 C)  Resp 18  SpO2 99% Physical Exam  Constitutional: She is oriented to person, place, and time. She appears well-developed and well-nourished. No distress.  HENT:  Head: Normocephalic and atraumatic.  Eyes: EOM are normal. Pupils are equal, round, and reactive to light.  Neck: Normal range of motion. Neck supple.  Cardiovascular: Normal rate and regular rhythm.  Exam reveals no gallop and no friction rub.   No murmur heard. Pulmonary/Chest: Effort normal. She has no wheezes. She has no rales. She exhibits no tenderness.  Abdominal: Soft. She exhibits no distension. There is no tenderness. There is no rebound and no guarding.  Musculoskeletal: She exhibits no edema or tenderness.  Neurological: She is alert and oriented to person, place, and time.  Skin: Skin is warm and dry. She is not diaphoretic.  Psychiatric: She has a normal mood and affect. Her behavior is normal.  Nursing note and vitals reviewed.   ED Course  Procedures (including critical care time) Labs Review Labs Reviewed  BASIC METABOLIC PANEL - Abnormal; Notable for the following:    Glucose,  Bld 144 (*)    BUN 22 (*)    Creatinine, Ser 1.09 (*)    GFR calc non Af Amer 46 (*)    GFR calc Af Amer 53 (*)    All other components within normal limits  CBC WITH DIFFERENTIAL/PLATELET - Abnormal; Notable for the following:    RBC 5.21 (*)    Hemoglobin 15.5 (*)    HCT 46.9 (*)    Eosinophils Absolute 0.8 (*)    All other components within normal limits  URINALYSIS, ROUTINE W REFLEX MICROSCOPIC (NOT AT [facility]) - Abnormal; Notable for the following:    APPearance CLOUDY (*)    Nitrite POSITIVE (*)    Leukocytes, UA TRACE (*)    All other components within normal limits  URINE MICROSCOPIC-ADD ON - Abnormal; Notable for the following:    Squamous Epithelial / LPF 0-5 (*)    Bacteria, UA MANY (*)    All other components within normal limits    Imaging Review Dg Chest 2 View  07/06/2015  CLINICAL DATA:  Short of breath and cough for several days. EXAM: CHEST  2 VIEW COMPARISON:  11/22/2014 FINDINGS: Normal cardiac silhouette with ectatic aorta. No effusion, infiltrate, pneumothorax. Chronic bronchitic markings noted. IMPRESSION: No acute cardiopulmonary process. Electronically Signed   By: Suzy Bouchard M.D.   On: 07/06/2015 15:55   I have personally reviewed and evaluated these images and lab results as part of my medical decision-making.   EKG Interpretation   Date/Time:  Thursday July 06 2015 12:52:38 EST Ventricular Rate:  83 PR Interval:  187 QRS Duration: 78 QT Interval:  369 QTC Calculation: 434 R Axis:   -32 Text Interpretation:  Sinus rhythm Baseline wander in lead(s) II No  significant change since last tracing Confirmed by Tawonda Legaspi MD, Quillian Quince  ([id number]) on 07/06/2015 3:44:11 PM      MDM   Final diagnoses:  UTI (lower urinary tract infection)    81 yo F with a cc of weakness.  Found to have UTI on UA.  No symptoms.  Will treat.  Awaiting cxr.   CXR unremarkable.  Feel patient safe  for discharge. Will follow up with urology tomorrow.    I have discussed  the diagnosis/risks/treatment options with the patient and family and believe the pt to be eligible for discharge home to follow-up with Urology. We also discussed returning to the ED immediately if new or worsening sx occur. We discussed the sx which are most concerning (e.g., sudden worsening pain, fever, inability to tolerate by mouth) that necessitate immediate return. Medications administered to the patient during their visit and any new prescriptions provided to the patient are listed below.  Medications given during this visit Medications - No data to display  Discharge Medication List as of 07/06/2015  4:11 PM    START taking these medications   Details  cefpodoxime (VANTIN) 100 MG tablet Take 1 tablet (100 mg total) by mouth 2 (two) times daily., Starting 07/06/2015, Until Discontinued, Print        The patient appears reasonably screen and/or stabilized for discharge and I doubt any other medical condition or other [facility] requiring further screening, evaluation, or treatment in the ED at this time prior to discharge.    Deno Etienne, DO 07/07/15 0007

## 2015-07-06 NOTE — Telephone Encounter (Signed)
Printed and signed.  

## 2015-07-07 DIAGNOSIS — N3000 Acute cystitis without hematuria: Secondary | ICD-10-CM | POA: Diagnosis not present

## 2015-07-07 DIAGNOSIS — N952 Postmenopausal atrophic vaginitis: Secondary | ICD-10-CM | POA: Diagnosis not present

## 2015-07-25 ENCOUNTER — Other Ambulatory Visit: Payer: Self-pay | Admitting: Geriatric Medicine

## 2015-07-25 ENCOUNTER — Telehealth: Payer: Self-pay | Admitting: Internal Medicine

## 2015-07-25 ENCOUNTER — Encounter: Payer: Self-pay | Admitting: Internal Medicine

## 2015-07-25 MED ORDER — GABAPENTIN 100 MG PO CAPS: 100.0000 mg | ORAL_CAPSULE | Freq: Three times a day (TID) | ORAL | Status: DC

## 2015-07-25 NOTE — Telephone Encounter (Signed)
Sent one month supply with 0 refills.

## 2015-07-25 NOTE — Telephone Encounter (Signed)
Have scheduled patient for med fu for next week.  Patient states she is almost out of neurontin..  She is requesting refill to be sent to [facility] on [address].

## 2015-07-25 NOTE — Telephone Encounter (Signed)
She would need a visit to adjust amount. Last filled on Dec 8th and not seen since November and has been filling 30 per month for some time. If taking 2 per day all the time should be seen.

## 2015-08-01 ENCOUNTER — Encounter: Payer: Self-pay | Admitting: Internal Medicine

## 2015-08-01 ENCOUNTER — Ambulatory Visit (INDEPENDENT_AMBULATORY_CARE_PROVIDER_SITE_OTHER): Payer: Commercial Managed Care - HMO | Admitting: Internal Medicine

## 2015-08-01 VITALS — BP 148/80 | HR 112 | Temp 98.4°F | Resp 18 | Ht 62.0 in | Wt 146.4 lb

## 2015-08-01 DIAGNOSIS — R0600 Dyspnea, unspecified: Secondary | ICD-10-CM

## 2015-08-01 DIAGNOSIS — M4802 Spinal stenosis, cervical region: Secondary | ICD-10-CM

## 2015-08-01 DIAGNOSIS — J45991 Cough variant asthma: Secondary | ICD-10-CM

## 2015-08-01 DIAGNOSIS — R450 Nervousness: Secondary | ICD-10-CM | POA: Diagnosis not present

## 2015-08-01 MED ORDER — FLUTICASONE FUROATE-VILANTEROL 100-25 MCG/INH IN AEPB: 1.0000 | INHALATION_SPRAY | Freq: Every day | RESPIRATORY_TRACT | Status: DC

## 2015-08-01 MED ORDER — BENZONATATE 100 MG PO CAPS: ORAL_CAPSULE | ORAL | Status: DC

## 2015-08-01 MED ORDER — GABAPENTIN 300 MG PO CAPS: 300.0000 mg | ORAL_CAPSULE | Freq: Three times a day (TID) | ORAL | Status: DC

## 2015-08-01 MED ORDER — METHYLPREDNISOLONE ACETATE 40 MG/ML IJ SUSP
40.0000 mg | Freq: Once | INTRAMUSCULAR | Status: AC
  Administered 2015-08-01: 40 mg via INTRAMUSCULAR

## 2015-08-01 NOTE — Patient Instructions (Addendum)
We have sent in breo for the breathing. Take 1 puff each day for the breathing to keep it good. We have given you a steroid injection today for the breathing.   We have increased the gabapentin so when you fill it you can take 300 mg three times per day.   We cannot increase the tramadol today since you are still filling the hydrocodone several times per month. It is dangerous to mix these medications.

## 2015-08-01 NOTE — Progress Notes (Signed)
Pre visit review using our clinic review tool, if applicable. No additional management support is needed unless otherwise documented below in the visit note. 

## 2015-08-02 ENCOUNTER — Encounter: Payer: Self-pay | Admitting: Internal Medicine

## 2015-08-02 NOTE — Assessment & Plan Note (Signed)
At this time she is asking for tramadol increase to TID. This was denied. When looking her up in the [facility] narcotic database she just filled 45 hydrocodone from her orthopedic on 07/19/15. She is consistently filling twice per month for the last several months. Okay with increasing the gabapentin to 300 mg TID.

## 2015-08-02 NOTE — Progress Notes (Signed)
   Subjective:    Patient ID: Alexa [name], female    DOB: 1930/02/04, 82 y.o.   MRN: [id number]  HPI The patient is an 82 YO female coming in with multiple complaints. Her main concern is her coughing and breathing. She does not think her serevent has been working lately. She has used it for many years. She is coughing more often for the last 2 months. Has had nasal drainage and dripping. She is not taking her flonase at this time. She used the [employer] which were helpful but she is out of those. She denies fevers or chills. Having more SOB with exertion. This is limiting what she is able to do at home.  Next concern is her neck pain and she wants to get more tramadol and increase in dosage of gabapentin. She has been doubling the gabapentin dose (without our advice) and wants to go up to 300 mg so that she will only have to take 1 pill. She would really like to take tramadol with the gabapentin TID. Currently it is written for 1 per day and she is taking some days 2 per day. She is getting hydrocodone from her orthopedic but claims she is not taking it any longer.   Review of Systems  Constitutional: Positive for activity change. Negative for fever, chills, appetite change, fatigue and unexpected weight change.  HENT: Positive for congestion, postnasal drip and rhinorrhea. Negative for sinus pressure, sore throat and trouble swallowing.   Eyes: Negative.   Respiratory: Positive for cough and shortness of breath. Negative for chest tightness and wheezing.   Cardiovascular: Negative for chest pain, palpitations and leg swelling.  Gastrointestinal: Positive for diarrhea and constipation. Negative for nausea, abdominal pain and abdominal distention.       Alternating IBS  Musculoskeletal: Positive for back pain, arthralgias and neck pain. Negative for myalgias and gait problem.  Skin: Negative.   Neurological: Positive for weakness and numbness. Negative for dizziness and light-headedness.    Psychiatric/Behavioral: Positive for dysphoric mood. The patient is nervous/anxious.       Objective:   Physical Exam  Constitutional: She is oriented to person, place, and time. She appears well-developed and well-nourished.  HENT:  Head: Normocephalic and atraumatic.  Oropharynx red with clear drainage  Eyes: EOM are normal.  Neck: Normal range of motion. No JVD present.  Cardiovascular: Normal rate and regular rhythm.   Pulmonary/Chest: Effort normal and breath sounds normal. No respiratory distress. She has no wheezes. She has no rales.  Diffuse mild wheezing, does not clear with cough  Abdominal: Soft. She exhibits no distension. There is no tenderness. There is no rebound.  Musculoskeletal: She exhibits no edema.  Neurological: She is alert and oriented to person, place, and time.  Skin: Skin is warm and dry.  Psychiatric: She has a normal mood and affect.   Filed Vitals:   08/01/15 1518  BP: 148/80  Pulse: 112  Temp: 98.4 F (36.9 C)  TempSrc: Oral  Resp: 18  Height: 5\' 2"  (1.575 m)  Weight: 146 lb 6.4 oz (66.407 kg)  SpO2: 91%      Assessment & Plan:  Depo-medrol 40 mg IM given at visit.

## 2015-08-02 NOTE — Assessment & Plan Note (Signed)
She was also concerned about this today and unable to address given her other more serious concerns.

## 2015-08-02 NOTE — Assessment & Plan Note (Signed)
With exacerbation today, given steroid injection at visit. Will change serevent and flovent to breo. She was also strongly encouraged to use flonase or zyrtec for her nasal symptoms which are leading to her cough and breathing. Also asked her to return to pulmonology since her last visit was some time ago.

## 2015-08-10 ENCOUNTER — Telehealth: Payer: Self-pay | Admitting: Internal Medicine

## 2015-08-10 NOTE — Telephone Encounter (Signed)
States mg on neurontin is not correct.  States she has 300mg  pills and can not break apart.  Please follow up with.

## 2015-08-11 NOTE — Telephone Encounter (Signed)
I tried to reach patient, no answer. LM for patient to call me back.

## 2015-08-15 MED ORDER — GABAPENTIN 100 MG PO CAPS: 200.0000 mg | ORAL_CAPSULE | Freq: Three times a day (TID) | ORAL | Status: DC

## 2015-08-15 NOTE — Telephone Encounter (Signed)
Sent in to pharmacy. They only make 100 mg so take 2 pills TID prn.

## 2015-08-15 NOTE — Telephone Encounter (Signed)
Received call pt states she called on Friday needing refill on her Neurontin but she want md to rx 200 mg instead of 300 mg. Pt states the 300 mg knock her out... Make her sleepy/drowsey. Pls advise...Johny Chess

## 2015-08-16 NOTE — Telephone Encounter (Signed)
Notified pt with md response.../lmb 

## 2015-09-12 ENCOUNTER — Telehealth: Payer: Self-pay | Admitting: *Deleted

## 2015-09-12 DIAGNOSIS — F4323 Adjustment disorder with mixed anxiety and depressed mood: Secondary | ICD-10-CM

## 2015-09-12 MED ORDER — BUPROPION HCL 75 MG PO TABS: 75.0000 mg | ORAL_TABLET | Freq: Two times a day (BID) | ORAL | Status: DC

## 2015-09-12 MED ORDER — VALSARTAN 40 MG PO TABS: 40.0000 mg | ORAL_TABLET | Freq: Every day | ORAL | Status: DC

## 2015-09-12 NOTE — Telephone Encounter (Signed)
Left msg on triage requesting refills on her Valsartan & Wellbutrin.Alexa [name] calling pt back no answer LMOM refills sent to [facility].Marland Kitchenlmb

## 2015-10-06 ENCOUNTER — Telehealth: Payer: Self-pay | Admitting: Internal Medicine

## 2015-10-06 NOTE — Telephone Encounter (Signed)
Pt called stated her and her spouse Chealsea Norton 10/13/27) received a bill for lab work that they did (her was back in Sep 2016 and his was recently this year). Pt stated the bill went to [place]. Please check, pt spoke with multiple people and they are unable to help.

## 2015-10-11 DIAGNOSIS — Z85528 Personal history of other malignant neoplasm of kidney: Secondary | ICD-10-CM | POA: Diagnosis not present

## 2015-10-11 DIAGNOSIS — Z0000 Encounter for general adult medical examination without abnormal findings: Secondary | ICD-10-CM | POA: Diagnosis not present

## 2015-10-11 DIAGNOSIS — N952 Postmenopausal atrophic vaginitis: Secondary | ICD-10-CM | POA: Diagnosis not present

## 2015-10-11 DIAGNOSIS — N3000 Acute cystitis without hematuria: Secondary | ICD-10-CM | POA: Diagnosis not present

## 2015-10-12 NOTE — Telephone Encounter (Signed)
Called patient. She does not have any bills ( I need to know where the bills are coming from, [place]?). I think she has received an EOB not an invoice. Advised her to call me if she gets another bill and we will investigate the charges. Confirmed [employer] is correctly in patients account.

## 2015-10-22 ENCOUNTER — Other Ambulatory Visit: Payer: Self-pay | Admitting: Internal Medicine

## 2015-10-23 NOTE — Telephone Encounter (Signed)
Faxed to pharmacy

## 2015-11-20 ENCOUNTER — Telehealth: Payer: Self-pay

## 2015-11-20 ENCOUNTER — Telehealth: Payer: Self-pay | Admitting: Internal Medicine

## 2015-11-20 MED ORDER — FLUTICASONE PROPIONATE HFA 110 MCG/ACT IN AERO: 1.0000 | INHALATION_SPRAY | Freq: Two times a day (BID) | RESPIRATORY_TRACT | Status: DC

## 2015-11-20 NOTE — Telephone Encounter (Signed)
Breoel 100mg  +/ 25mg  once a day is breaking her out in a rash. She wants to stop the medication and try something else. Please follow up.

## 2015-11-20 NOTE — Telephone Encounter (Signed)
Advise Cheri I am very sorry but I am unable to take new patient at this time, recommend to continue see the doctors at [facility]

## 2015-11-20 NOTE — Telephone Encounter (Signed)
Breo inhaler is breaking the patient out in a rash. She says she has been on it for two months and she stopped taking it and the rash went away. She started taking it again and now she is itching again. She used flovent and surovent in the past and they both worked. She is asking for a replacement for the [facility]. Please advise, thanks.

## 2015-11-20 NOTE — Telephone Encounter (Signed)
Daughter advised.

## 2015-11-20 NOTE — Telephone Encounter (Signed)
Sent in flovent and okay to stop taking breo.

## 2015-11-20 NOTE — Telephone Encounter (Signed)
Patient aware.

## 2015-11-20 NOTE — Telephone Encounter (Signed)
Caller name: Tobias Alexander  Relation to pt: daughter  Call back number:  [phone]   Reason for call:  [name] [id number] patient of yours would like to refer her father to establish care with you. Daughter states she values your opinion. Please advise

## 2015-12-06 ENCOUNTER — Telehealth: Payer: Self-pay | Admitting: Behavioral Health

## 2015-12-06 NOTE — Telephone Encounter (Signed)
Attempted to reach patient on both the home and mobile number listed for Pre-visit call. Per recording, the patient is unavailable at this time; not able to leave a message.

## 2015-12-07 ENCOUNTER — Other Ambulatory Visit: Payer: Self-pay | Admitting: Internal Medicine

## 2015-12-07 ENCOUNTER — Encounter: Payer: Self-pay | Admitting: Family Medicine

## 2015-12-07 ENCOUNTER — Ambulatory Visit (INDEPENDENT_AMBULATORY_CARE_PROVIDER_SITE_OTHER): Payer: Commercial Managed Care - HMO | Admitting: Family Medicine

## 2015-12-07 VITALS — BP 166/91 | HR 92 | Temp 98.8°F | Ht 61.5 in

## 2015-12-07 DIAGNOSIS — Z7189 Other specified counseling: Secondary | ICD-10-CM

## 2015-12-07 DIAGNOSIS — R5381 Other malaise: Secondary | ICD-10-CM | POA: Diagnosis not present

## 2015-12-07 DIAGNOSIS — J45991 Cough variant asthma: Secondary | ICD-10-CM | POA: Diagnosis not present

## 2015-12-07 DIAGNOSIS — Z7689 Persons encountering health services in other specified circumstances: Secondary | ICD-10-CM

## 2015-12-07 MED ORDER — FLUTICASONE PROPIONATE HFA 220 MCG/ACT IN AERO: 2.0000 | INHALATION_SPRAY | Freq: Two times a day (BID) | RESPIRATORY_TRACT | Status: DC

## 2015-12-07 NOTE — Patient Instructions (Signed)
We will try increasing your flovent to a stronger form- you will still take 2 puffs twice a day Please come and see me in 3-4 months for a check up

## 2015-12-07 NOTE — Progress Notes (Signed)
[place] at [facility] [address], [place], [place] [address] [phone]  Date:  12/07/2015   Name:  Alexa [name]   DOB:  12-Jul-1930   MRN:  [id number]  PCP:  Hoyt Koch, MD    Chief Complaint: Establish Care   History of Present Illness:  Alexa [name] is a 82 y.o. very pleasant female patient who presents with the following:  Here today to establish care as her past PCP has retired.  States that she has enjoyed good health really thoughout her life until the last few years "when age started to catch up with me." She has some chronic neck pain and is using gabapentin for this.   She is a native of [place], married for over 38 years.  I am also seeing her husband today  She is on flovent for COPD.  However as of late she feels like it is not working that well for her as she still will get SOB.  She is taking 2 puffs of the 220 BID. She does not really wheeze but she will feel SOB. She does have albuterol but has not needed to use it much recently She is able to do some yard work but does feel that her breathing holds her back some  Wt Readings from Last 3 Encounters:  08/01/15 146 lb 6.4 oz (66.407 kg)  06/05/15 142 lb (64.411 kg)  04/04/15 140 lb 6.4 oz (63.685 kg)    BP Readings from Last 3 Encounters:  08/01/15 148/80  07/06/15 145/102  06/05/15 130/86    Patient Active Problem List   Diagnosis Date Noted  . Nervous 06/05/2015  . Cough variant asthma 12/29/2014  . Upper airway cough syndrome 12/29/2014  . Spinal stenosis in cervical region 12/13/2014  . Radiculopathy, lumbar region 12/13/2014  . PERONEAL NEUROPATHY 05/03/2008  . Malignant neoplasm of kidney excluding renal pelvis ([place]) 08/06/2007  . Hyperglycemia 08/06/2007  . GERD 08/06/2007  . PULMONARY EMBOLISM, HX OF 08/06/2007  . Hyperlipidemia 09/03/2006  . Essential hypertension 09/03/2006  . IBS 09/03/2006    Past Medical History  Diagnosis  Date  . Chronic obstructive pulmonary disease ([place])   . Personal history of venous thrombosis and embolism     PTE after nephrectomy  . Esophageal reflux   . Irritable bowel syndrome   . Unspecified essential hypertension   . Other and unspecified hyperlipidemia   . Diverticulosis of colon (without mention of hemorrhage)   . Spinal stenosis   . History of colonic polyps 06/05/05    hyperplastic  . Diverticulosis   . Internal hemorrhoids   . Type II or unspecified type diabetes mellitus without mention of complication, not stated as uncontrolled 02/05/2012    pt states it was in 2007  . Ischemic colitis ([place])   . Asthma   . Malignant neoplasm of kidney and other and unspecified urinary organs     renal carcinoma, left nephrectomy in 2008-per patient.    Past Surgical History  Procedure Laterality Date  . Septoplasty    . Tonsillectomy    . Breast biopsy    . Colonoscopy w/ polypectomy  2000  . Colonoscopy w/ polypectomy  07/2004    Hyperplastic polyps  . Dilation and curettage of uterus  03/2005    Uterine Polyps  . Nephrectomy  2007    right,Dr Dahlstedt    Social History  Substance Use Topics  . Smoking status: Never  Smoker   . Smokeless tobacco: Never Used  . Alcohol Use: No    Family History  Problem Relation Age of Onset  . Stroke Mother     onset in 25s  . Diabetes Mother   . Cancer Mother     bladder; nephrectomy for calculi/also uterine , TAH & BSO  . Aneurysm Mother     thoracic  . Depression Mother   . Stroke Brother   . Heart failure Brother   . Heart attack Other     maternal family history  . Heart failure Maternal Grandfather   . Lung cancer Father     smoked  . Heart failure Sister   . Depression Sister   . Alzheimer's disease Sister   . COPD Sister   . Aneurysm Brother     cns  . Depression Maternal Aunt     X2  . Bipolar disorder Sister   . Alcoholism Neg Hx     Allergies  Allergen Reactions  . Lisinopril     Cough D/Ced by Dr  Melvyn Novas  . Codeine     nausea  . Verapamil     Pain in feet    Medication list has been reviewed and updated.  Current Outpatient Prescriptions on File Prior to Visit  Medication Sig Dispense Refill  . Ascorbic Acid (VITAMIN C) POWD Take 1 packet by mouth 2 (two) times daily as needed (cold symptoms).    Marland Kitchen aspirin EC 81 MG tablet Take 162 mg by mouth daily.    . Benzalkonium Chloride (DIABETIC BASICS HEALTHY FOOT EX) Apply 1 application topically daily as needed (foot pain). OTC diabetic foot cream    . benzonatate (TESSALON) 100 MG capsule take 1 capsule by mouth twice a day if needed for cough 60 capsule 1  . Blood Glucose Monitoring Suppl (TRUERESULT BLOOD GLUCOSE) W/DEVICE KIT Use to test blood sugar ICD 10 E11 9 1 each 0  . buPROPion (WELLBUTRIN) 75 MG tablet Take 1 tablet (75 mg total) by mouth 2 (two) times daily. 180 tablet 1  . busPIRone (BUSPAR) 5 MG tablet Take 0.5-1 tablets (2.5-5 mg total) by mouth daily as needed. 30 tablet 3  . CALCIUM PO Take 1 tablet by mouth daily.    . Cholecalciferol (VITAMIN D PO) Take 1 tablet by mouth daily.    Marland Kitchen CRANBERRY PO Take 2 capsules by mouth 2 (two) times daily.     . fluticasone (FLOVENT HFA) 110 MCG/ACT inhaler Inhale 1 puff into the lungs 2 (two) times daily. 1 Inhaler 12  . Fluticasone Furoate-Vilanterol (BREO ELLIPTA) 100-25 MCG/INH AEPB Inhale 1 puff into the lungs daily. 30 each 3  . Fluticasone Propionate (FLONASE NA) Place 1-2 sprays into the nose 2 (two) times daily as needed.    . gabapentin (NEURONTIN) 100 MG capsule Take 2 capsules (200 mg total) by mouth 3 (three) times daily. 180 capsule 3  . glucose blood (TRUETEST TEST) test strip Use to test blood sugar ICD 10 E11 9 100 each 3  . Multiple Vitamins-Minerals (EYE VITAMINS PO) Take by mouth.    . pantoprazole (PROTONIX) 40 MG tablet Take 1 tablet (40 mg total) by mouth daily. 30 tablet 0  . PROAIR HFA 108 (90 BASE) MCG/ACT inhaler USE AS DIRECTED 8.5 Inhaler 5  . ranitidine  (ZANTAC) 150 MG capsule Take 1 capsule (150 mg total) by mouth 2 (two) times daily. 60 capsule 6  . senna-docusate (SENOKOT-S) 8.6-50 MG tablet Take 1 tablet by mouth  daily. 30 tablet 3  . traMADol (ULTRAM) 50 MG tablet take 1 tablet by mouth once daily if needed 30 tablet 2  . TRUEPLUS LANCETS 28G MISC Use to test blood sugar ICD 10 E11 9 100 each 3  . valsartan (DIOVAN) 40 MG tablet Take 1 tablet (40 mg total) by mouth daily. 90 tablet 1  . [DISCONTINUED] DULoxetine (CYMBALTA) 30 MG capsule Take 1 capsule (30 mg total) by mouth daily. 30 capsule 0   No current facility-administered medications on file prior to visit.    Review of Systems:  As per HPI- otherwise negative.   Physical Examination: Ideal Body Weight Filed Vitals:   12/07/15 1402  BP: 166/91  Pulse: 92  Temp: 98.8 F (37.1 C)   Wt Readings from Last 3 Encounters:  08/01/15 146 lb 6.4 oz (66.407 kg)  06/05/15 142 lb (64.411 kg)  04/04/15 140 lb 6.4 oz (63.685 kg)    GEN: WDWN, NAD, Non-toxic, A & O x 3, overweight, looks well HEENT: Atraumatic, Normocephalic. Neck supple. No masses, No LAD.  Bilateral TM wnl, oropharynx normal.  PEERL,EOMI.   Ears and Nose: No external deformity. CV: RRR, No M/G/R. No JVD. No thrill. No extra heart sounds. PULM: CTA B, no wheezes, crackles, rhonchi. No retractions. No resp. distress. No accessory muscle use. ABD: S, NT, ND EXTR: No c/c/e NEURO Normal gait.  PSYCH: Normally interactive. Conversant. Not depressed or anxious appearing.  Calm demeanor.    Assessment and Plan: Cough variant asthma - Plan: fluticasone (FLOVENT HFA) 220 MCG/ACT inhaler  Physical debility - Plan: Walker rolling  Establishing care with new doctor, encounter for  rx for a rolling walker for her today She feels that her COPD/ cough variant asthma sx are incompletely controlled- will try going up on her flovent dose Asked her to come and see me for a recheck in 3-4 months  BP generally under good  control- will monitor, continue meds Signed Lamar Blinks, MD

## 2015-12-14 ENCOUNTER — Other Ambulatory Visit: Payer: Self-pay

## 2015-12-14 ENCOUNTER — Other Ambulatory Visit: Payer: Self-pay | Admitting: Internal Medicine

## 2015-12-14 MED ORDER — RANITIDINE HCL 150 MG PO CAPS: 150.0000 mg | ORAL_CAPSULE | Freq: Two times a day (BID) | ORAL | Status: DC

## 2015-12-18 ENCOUNTER — Telehealth: Payer: Self-pay | Admitting: Family Medicine

## 2015-12-18 NOTE — Telephone Encounter (Signed)
Called to check in with her regarding her BP- did not reach, LMOM.  Please check her BP at home or at drug store.  If higher than 145/90 please let me know

## 2016-01-24 ENCOUNTER — Telehealth: Payer: Self-pay | Admitting: Family Medicine

## 2016-01-24 NOTE — Telephone Encounter (Signed)
Caller name: Relationship to patient: Self  Can be reached: [phone]    Reason for call: Request another medication that is equal to Tramadol because her insurance will not pay for it.

## 2016-01-25 ENCOUNTER — Other Ambulatory Visit: Payer: Self-pay | Admitting: Internal Medicine

## 2016-01-25 ENCOUNTER — Telehealth: Payer: Self-pay | Admitting: Internal Medicine

## 2016-01-25 NOTE — Telephone Encounter (Signed)
That is fine 

## 2016-01-25 NOTE — Telephone Encounter (Signed)
Patient request to transfer from Dr Sharlet Salina to Dr Lorelei Pont.

## 2016-01-25 NOTE — Telephone Encounter (Signed)
Fine with me

## 2016-01-25 NOTE — Telephone Encounter (Signed)
Sent to pharmacy 

## 2016-02-01 ENCOUNTER — Ambulatory Visit (INDEPENDENT_AMBULATORY_CARE_PROVIDER_SITE_OTHER): Payer: Commercial Managed Care - HMO | Admitting: Family Medicine

## 2016-02-01 ENCOUNTER — Encounter: Payer: Self-pay | Admitting: Family Medicine

## 2016-02-01 VITALS — BP 159/88 | HR 90 | Temp 98.2°F | Ht 61.5 in | Wt 144.6 lb

## 2016-02-01 DIAGNOSIS — N189 Chronic kidney disease, unspecified: Secondary | ICD-10-CM

## 2016-02-01 DIAGNOSIS — M4802 Spinal stenosis, cervical region: Secondary | ICD-10-CM | POA: Diagnosis not present

## 2016-02-01 DIAGNOSIS — J449 Chronic obstructive pulmonary disease, unspecified: Secondary | ICD-10-CM | POA: Diagnosis not present

## 2016-02-01 DIAGNOSIS — E119 Type 2 diabetes mellitus without complications: Secondary | ICD-10-CM | POA: Diagnosis not present

## 2016-02-01 LAB — COMPREHENSIVE METABOLIC PANEL
ALBUMIN: 4 g/dL (ref 3.5–5.2)
ALK PHOS: 55 U/L (ref 39–117)
ALT: 33 U/L (ref 0–35)
AST: 29 U/L (ref 0–37)
BUN: 25 mg/dL — ABNORMAL HIGH (ref 6–23)
CALCIUM: 9.9 mg/dL (ref 8.4–10.5)
CO2: 26 mEq/L (ref 19–32)
CREATININE: 1.11 mg/dL (ref 0.40–1.20)
Chloride: 104 mEq/L (ref 96–112)
GFR: 49.75 mL/min — ABNORMAL LOW (ref >60.00)
Glucose, Bld: 125 mg/dL — ABNORMAL HIGH (ref 70–99)
POTASSIUM: 4.2 meq/L (ref 3.5–5.1)
SODIUM: 137 meq/L (ref 135–145)
TOTAL PROTEIN: 7.1 g/dL (ref 6.0–8.3)
Total Bilirubin: 0.9 mg/dL (ref 0.2–1.2)

## 2016-02-01 LAB — HEMOGLOBIN A1C: HEMOGLOBIN A1C: 6.7 % — AB (ref 4.6–6.5)

## 2016-02-01 MED ORDER — ALBUTEROL SULFATE HFA 108 (90 BASE) MCG/ACT IN AERS
1.0000 | INHALATION_SPRAY | Freq: Four times a day (QID) | RESPIRATORY_TRACT | Status: DC | PRN
Start: 2016-02-01 — End: 2018-12-16

## 2016-02-01 MED ORDER — TRAMADOL HCL 50 MG PO TABS: 50.0000 mg | ORAL_TABLET | Freq: Four times a day (QID) | ORAL | Status: DC | PRN

## 2016-02-01 NOTE — Patient Instructions (Addendum)
You may take a tramadol every 6-8 hours as needed as needed for your back pain.  Remember that increasing your dose will increase possible sedation.  There is also more risk of a rare condition called serotonin syndrome; symptoms here include: Agitation or restlessness. Confusion. Rapid heart rate and high blood pressure. Dilated pupils. Loss of muscle coordination or twitching muscles. Muscle rigidity. Heavy sweating. Diarrhea.  I will arrange a pulmonology consult for you with [facility]

## 2016-02-01 NOTE — Progress Notes (Signed)
Pre visit review using our clinic review tool, if applicable. No additional management support is needed unless otherwise documented below in the visit note. 

## 2016-02-01 NOTE — Progress Notes (Signed)
[place] at [facility] [address] [place], [place] [address] [phone]  Date:  02/01/2016   Name:  Alexa [name]   DOB:  September 23, 1929   MRN:  [id number]  PCP:  Lamar Blinks, MD    Chief Complaint: Follow-up   History of Present Illness:  Alexa [name] is a 82 y.o. very pleasant female patient who presents with the following:  At our last visit we increased her flovent dose for her COPD/ cough variant asthma. She did see pulmonology in the past, last visit about a year ago.  She is vague about her respiratory sx but feels that her breathing continues to hold her back from her desired activities. She would like to have a 2nd pulmonary opinion  Current tramdol rx for once daily. States that she needs to take it every 6-8 hours as needed.  She has used it for 3-4 years.  Her back pains have gotten worse and she was dx with spinal stenosis; over the last year her back pain has gotten much worse.  MRI from a year ago IMPRESSION: 1. Widespread moderate to severe cervical spine degeneration. Multifactorial spinal stenosis from C3-C4 to C6-C7, with mild cord mass effect at the latter but no cord signal abnormality. 2. Multifactorial moderate or severe cervical neural foraminal stenosis at every cervical level. 3. Chronic medial right cerebellar hemisphere infarct with encephalomalacia. Intracranial artery dolichoectasia.  This is managed by [facility] but they do not write her pain medication (tramadol)  She has also used robaxin in the past which did help her some.  She is still on her neurontin TID  She did have a nephrectomy due to renal cancer about 10 years ago. Her most recent creat was 1.09; creat clearance of 36.    Diet controlled DM- she checks her glucose on occasion but not that frequently.  Last A1c was several months ago  Patient Active Problem List   Diagnosis Date Noted  . Nervous 06/05/2015  . Cough variant asthma  12/29/2014  . Upper airway cough syndrome 12/29/2014  . Spinal stenosis in cervical region 12/13/2014  . Radiculopathy, lumbar region 12/13/2014  . PERONEAL NEUROPATHY 05/03/2008  . Malignant neoplasm of kidney excluding renal pelvis ([place]) 08/06/2007  . Hyperglycemia 08/06/2007  . GERD 08/06/2007  . PULMONARY EMBOLISM, HX OF 08/06/2007  . Hyperlipidemia 09/03/2006  . Essential hypertension 09/03/2006  . IBS 09/03/2006    Past Medical History  Diagnosis Date  . Chronic obstructive pulmonary disease ([place])   . Personal history of venous thrombosis and embolism     PTE after nephrectomy  . Esophageal reflux   . Irritable bowel syndrome   . Unspecified essential hypertension   . Other and unspecified hyperlipidemia   . Diverticulosis of colon (without mention of hemorrhage)   . Spinal stenosis   . History of colonic polyps 06/05/05    hyperplastic  . Diverticulosis   . Internal hemorrhoids   . Type II or unspecified type diabetes mellitus without mention of complication, not stated as uncontrolled 02/05/2012    pt states it was in 2007  . Ischemic colitis ([place])   . Asthma   . Malignant neoplasm of kidney and other and unspecified urinary organs     renal carcinoma, left nephrectomy in 2008-per patient.    Past Surgical History  Procedure Laterality Date  . Septoplasty    . Tonsillectomy    . Breast biopsy    .  Colonoscopy w/ polypectomy  2000  . Colonoscopy w/ polypectomy  07/2004    Hyperplastic polyps  . Dilation and curettage of uterus  03/2005    Uterine Polyps  . Nephrectomy  2007    right,Dr Dahlstedt    Social History  Substance Use Topics  . Smoking status: Never Smoker   . Smokeless tobacco: Never Used  . Alcohol Use: No    Family History  Problem Relation Age of Onset  . Stroke Mother     onset in 21s  . Diabetes Mother   . Cancer Mother     bladder; nephrectomy for calculi/also uterine , TAH & BSO  . Aneurysm Mother     thoracic  . Depression  Mother   . Stroke Brother   . Heart failure Brother   . Heart attack Other     maternal family history  . Heart failure Maternal Grandfather   . Lung cancer Father     smoked  . Heart failure Sister   . Depression Sister   . Alzheimer's disease Sister   . COPD Sister   . Aneurysm Brother     cns  . Depression Maternal Aunt     X2  . Bipolar disorder Sister   . Alcoholism Neg Hx     Allergies  Allergen Reactions  . Lisinopril     Cough D/Ced by Dr Melvyn Novas  . Codeine     nausea  . Verapamil     Pain in feet    Medication list has been reviewed and updated.  Current Outpatient Prescriptions on File Prior to Visit  Medication Sig Dispense Refill  . Ascorbic Acid (VITAMIN C) POWD Take 1 packet by mouth 2 (two) times daily as needed (cold symptoms).    Marland Kitchen aspirin EC 81 MG tablet Take 162 mg by mouth daily.    . Benzalkonium Chloride (DIABETIC BASICS HEALTHY FOOT EX) Apply 1 application topically daily as needed (foot pain). OTC diabetic foot cream    . Blood Glucose Monitoring Suppl (TRUERESULT BLOOD GLUCOSE) W/DEVICE KIT Use to test blood sugar ICD 10 E11 9 1 each 0  . buPROPion (WELLBUTRIN) 75 MG tablet Take 1 tablet (75 mg total) by mouth 2 (two) times daily. 180 tablet 1  . busPIRone (BUSPAR) 5 MG tablet Take 0.5-1 tablets (2.5-5 mg total) by mouth daily as needed. 30 tablet 3  . CALCIUM PO Take 1 tablet by mouth daily.    . Cholecalciferol (VITAMIN D PO) Take 1 tablet by mouth daily.    Marland Kitchen CRANBERRY PO Take 2 capsules by mouth 2 (two) times daily.     . fluticasone (FLOVENT HFA) 220 MCG/ACT inhaler Inhale 2 puffs into the lungs 2 (two) times daily. 1 Inhaler 12  . Fluticasone Propionate (FLONASE NA) Place 1-2 sprays into the nose 2 (two) times daily as needed.    . gabapentin (NEURONTIN) 100 MG capsule take 2 capsules by mouth three times a day 180 capsule 3  . glucose blood (TRUETEST TEST) test strip Use to test blood sugar ICD 10 E11 9 100 each 3  . methocarbamol (ROBAXIN)  500 MG tablet Take 1 tablet by mouth as needed.    . Multiple Vitamins-Minerals (EYE VITAMINS PO) Take by mouth.    . pantoprazole (PROTONIX) 40 MG tablet Take 1 tablet (40 mg total) by mouth daily. 30 tablet 0  . PROAIR HFA 108 (90 BASE) MCG/ACT inhaler USE AS DIRECTED 8.5 Inhaler 5  . RA P COL-RITE 8.6-50 MG  tablet take 1 tablet by mouth once daily 30 tablet 3  . ranitidine (ZANTAC) 150 MG capsule Take 1 capsule (150 mg total) by mouth 2 (two) times daily. 60 capsule 3  . traMADol (ULTRAM) 50 MG tablet take 1 tablet by mouth once daily if needed 30 tablet 0  . TRUEPLUS LANCETS 28G MISC Use to test blood sugar ICD 10 E11 9 100 each 3  . valsartan (DIOVAN) 40 MG tablet Take 1 tablet (40 mg total) by mouth daily. 90 tablet 1  . [DISCONTINUED] DULoxetine (CYMBALTA) 30 MG capsule Take 1 capsule (30 mg total) by mouth daily. 30 capsule 0   No current facility-administered medications on file prior to visit.    Review of Systems:  As per HPI- otherwise negative.   Physical Examination: Filed Vitals:   02/01/16 1146 02/01/16 1151  BP: 160/102 159/88  Pulse: 90   Temp: 98.2 F (36.8 C)    Filed Vitals:   02/01/16 1146  Height: 5' 1.5" (1.562 m)  Weight: 144 lb 9.6 oz (65.59 kg)   Body mass index is 26.88 kg/(m^2). Ideal Body Weight: Weight in (lb) to have BMI = 25: 134.2  GEN: WDWN, NAD, Non-toxic, A & O x 3, well appearing older lady who is mildly overweight HEENT: Atraumatic, Normocephalic. Neck supple. No masses, No LAD. Ears and Nose: No external deformity. CV: RRR, No M/G/R. No JVD. No thrill. No extra heart sounds. PULM: CTA B, no wheezes, crackles, rhonchi. No retractions. No resp. distress. No accessory muscle use. ABD: S, NT, ND EXTR: No c/c/e NEURO Normal gait.  PSYCH: Normally interactive. Conversant. Not depressed or anxious appearing.  Calm demeanor.    Lab Results  Component Value Date   HGBA1C 6.6* 08/11/2014    Assessment and Plan: Spinal stenosis in  cervical region - Plan: traMADol (ULTRAM) 50 MG tablet  Chronic obstructive pulmonary disease, unspecified COPD type ([place]) - Plan: albuterol (PROAIR HFA) 108 (90 Base) MCG/ACT inhaler, Ambulatory referral to Pulmonology  Chronic renal insufficiency, unspecified stage - Plan: Comprehensive metabolic panel  Controlled type 2 diabetes mellitus without complication, without long-term current use of insulin ([facility]) - Plan: Hemoglobin A1c  Will increase her tramadol to every 6-8 hours as needed for her neck pain due to cervical spinal stenosis. Urged her to use the tramadol as little as she can while still relieving her pain Due for labs to check on her A1c and CMP Referral to [facility] for a 2nd pulmonology opinion  You may take a tramadol every 6-8 hours as needed as needed for your back pain.  Remember that increasing your dose will increase possible sedation.  There is also more risk of a rare condition called serotonin syndrome; symptoms here include: Agitation or restlessness. Confusion. Rapid heart rate and high blood pressure. Dilated pupils. Loss of muscle coordination or twitching muscles. Muscle rigidity. Heavy sweating. Diarrhea.  I will arrange a pulmonology consult for you with [facility]   Signed Lamar Blinks, MD

## 2016-02-15 ENCOUNTER — Ambulatory Visit: Payer: Commercial Managed Care - HMO | Admitting: Internal Medicine

## 2016-03-03 ENCOUNTER — Other Ambulatory Visit: Payer: Self-pay | Admitting: Internal Medicine

## 2016-03-22 DIAGNOSIS — N3000 Acute cystitis without hematuria: Secondary | ICD-10-CM | POA: Diagnosis not present

## 2016-03-25 ENCOUNTER — Telehealth: Payer: Self-pay | Admitting: Emergency Medicine

## 2016-03-25 NOTE — Telephone Encounter (Signed)
Error- Pt  No longer Dr. Charlynne Cousins patient

## 2016-04-04 DIAGNOSIS — L723 Sebaceous cyst: Secondary | ICD-10-CM | POA: Diagnosis not present

## 2016-04-04 DIAGNOSIS — L821 Other seborrheic keratosis: Secondary | ICD-10-CM | POA: Diagnosis not present

## 2016-04-04 DIAGNOSIS — L814 Other melanin hyperpigmentation: Secondary | ICD-10-CM | POA: Diagnosis not present

## 2016-04-04 DIAGNOSIS — L820 Inflamed seborrheic keratosis: Secondary | ICD-10-CM | POA: Diagnosis not present

## 2016-04-30 DIAGNOSIS — N951 Menopausal and female climacteric states: Secondary | ICD-10-CM | POA: Diagnosis not present

## 2016-04-30 DIAGNOSIS — R8299 Other abnormal findings in urine: Secondary | ICD-10-CM | POA: Diagnosis not present

## 2016-04-30 DIAGNOSIS — Z01419 Encounter for gynecological examination (general) (routine) without abnormal findings: Secondary | ICD-10-CM | POA: Diagnosis not present

## 2016-05-12 ENCOUNTER — Other Ambulatory Visit: Payer: Self-pay | Admitting: Internal Medicine

## 2016-05-12 DIAGNOSIS — M4802 Spinal stenosis, cervical region: Secondary | ICD-10-CM

## 2016-05-21 ENCOUNTER — Encounter: Payer: Self-pay | Admitting: Family Medicine

## 2016-05-21 DIAGNOSIS — M4802 Spinal stenosis, cervical region: Secondary | ICD-10-CM

## 2016-05-22 MED ORDER — TRAMADOL HCL 50 MG PO TABS: 50.0000 mg | ORAL_TABLET | Freq: Two times a day (BID) | ORAL | 1 refills | Status: DC | PRN

## 2016-05-23 ENCOUNTER — Telehealth: Payer: Self-pay | Admitting: Family Medicine

## 2016-05-23 ENCOUNTER — Encounter: Payer: Self-pay | Admitting: Family Medicine

## 2016-05-23 NOTE — Telephone Encounter (Signed)
Received a note from Dr. Burt Knack, [facility] on [address] about planned dental surgery/ cleaning.  She does not have any condition that should indicate antibiotic prophylaxis per my knowledge and may continue her aspirin. Not on other blood thinners. Otherwise I defer to her Dentist about her dental care

## 2016-05-27 ENCOUNTER — Ambulatory Visit (INDEPENDENT_AMBULATORY_CARE_PROVIDER_SITE_OTHER): Payer: Commercial Managed Care - HMO | Admitting: Family Medicine

## 2016-05-27 VITALS — BP 151/66 | HR 92 | Temp 98.3°F | Wt 145.2 lb

## 2016-05-27 DIAGNOSIS — K219 Gastro-esophageal reflux disease without esophagitis: Secondary | ICD-10-CM

## 2016-05-27 DIAGNOSIS — F4323 Adjustment disorder with mixed anxiety and depressed mood: Secondary | ICD-10-CM | POA: Diagnosis not present

## 2016-05-27 DIAGNOSIS — M4802 Spinal stenosis, cervical region: Secondary | ICD-10-CM | POA: Diagnosis not present

## 2016-05-27 DIAGNOSIS — I10 Essential (primary) hypertension: Secondary | ICD-10-CM | POA: Diagnosis not present

## 2016-05-27 DIAGNOSIS — N189 Chronic kidney disease, unspecified: Secondary | ICD-10-CM

## 2016-05-27 DIAGNOSIS — T50Z95A Adverse effect of other vaccines and biological substances, initial encounter: Secondary | ICD-10-CM

## 2016-05-27 MED ORDER — GABAPENTIN 100 MG PO CAPS: 100.0000 mg | ORAL_CAPSULE | Freq: Four times a day (QID) | ORAL | 4 refills | Status: DC

## 2016-05-27 MED ORDER — TRAMADOL HCL 50 MG PO TABS: 50.0000 mg | ORAL_TABLET | Freq: Four times a day (QID) | ORAL | 2 refills | Status: DC | PRN

## 2016-05-27 MED ORDER — BUPROPION HCL 75 MG PO TABS: 75.0000 mg | ORAL_TABLET | Freq: Two times a day (BID) | ORAL | 3 refills | Status: DC

## 2016-05-27 MED ORDER — BUSPIRONE HCL 5 MG PO TABS: 2.5000 mg | ORAL_TABLET | Freq: Every day | ORAL | 3 refills | Status: DC | PRN

## 2016-05-27 MED ORDER — RANITIDINE HCL 150 MG PO CAPS: 150.0000 mg | ORAL_CAPSULE | Freq: Two times a day (BID) | ORAL | 3 refills | Status: DC

## 2016-05-27 MED ORDER — VALSARTAN 40 MG PO TABS: 40.0000 mg | ORAL_TABLET | Freq: Every day | ORAL | 3 refills | Status: DC

## 2016-05-27 NOTE — Progress Notes (Signed)
[place] at [facility] [address] [place], [place] [address] [phone]  Date:  05/27/2016   Name:  Alexa [name]   DOB:  [birth year]   MRN:  [id number]  PCP:  Lamar Blinks, MD    Chief Complaint: Cough (Recieved flu shot and pnuemococcal.  Now some fever aches and chills, ) and Rash (pt c/o of spot on her back, she thinks it may be shingles.)   History of Present Illness:  Alexa [name] is a 82 y.o. very pleasant female patient who presents with the following:  Here today to consult following immunizations - she had her flu shot and pneumonia shot.  They were given in the same arm at a drug store about 10 days ago.  The arm swelled up and was painful but this is now resolved She had chills, felt hot and cold, she was coughing up some dark mucus.   She does feel like she is getting better over the last couple of days but just wanted to make sure she was ok She did not notice any true fever- did not check her temperature.   She does feel that her cough is improving. She is still sneezing.  Her arm is back to normal No GI symptoms- she will use vinegar if she feels nauseated.  Also she has noted an itchy, irritated area on her back for about one week.  It is not painful- just itchy  She has had zostavax vaccine She is using tramadol for her neck and back pain- she also uses aleve. She will use tramadol 3-4x a day and it does control her pain She is on wellbutrin, and used neurontin for leg pain- she is taking 10o mg QID  She does use buspar on occasion and needs this refilled- she does not take it daily  Most recent BMP was 3 months ago- creat ok at 1.1  EKG 10 months ago showed normal QTc; she has been on tramadol and wellbutrin concurrently for several years.  Uses occasinal buspar  Patient Active Problem List   Diagnosis Date Noted  . Nervous 06/05/2015  . Cough variant asthma 12/29/2014  . Upper airway cough syndrome  12/29/2014  . Spinal stenosis in cervical region 12/13/2014  . Radiculopathy, lumbar region 12/13/2014  . PERONEAL NEUROPATHY 05/03/2008  . Malignant neoplasm of kidney excluding renal pelvis ([place]) 08/06/2007  . Hyperglycemia 08/06/2007  . GERD 08/06/2007  . PULMONARY EMBOLISM, HX OF 08/06/2007  . Hyperlipidemia 09/03/2006  . Essential hypertension 09/03/2006  . IBS 09/03/2006    Past Medical History:  Diagnosis Date  . Asthma   . Chronic obstructive pulmonary disease ([place])   . Diverticulosis   . Diverticulosis of colon (without mention of hemorrhage)   . Esophageal reflux   . History of colonic polyps 06/05/05   hyperplastic  . Internal hemorrhoids   . Irritable bowel syndrome   . Ischemic colitis ([place])   . Malignant neoplasm of kidney and other and unspecified urinary organs    renal carcinoma, left nephrectomy in 2008-per patient.  . Other and unspecified hyperlipidemia   . Personal history of venous thrombosis and embolism    PTE after nephrectomy  . Spinal stenosis   . Type II or unspecified type diabetes mellitus without mention of complication, not stated as uncontrolled 02/05/2012   pt states it was in 2007  . Unspecified essential hypertension     Past Surgical History:  Procedure Laterality  Date  . BREAST BIOPSY    . COLONOSCOPY W/ POLYPECTOMY  2000  . COLONOSCOPY W/ POLYPECTOMY  07/2004   Hyperplastic polyps  . DILATION AND CURETTAGE OF UTERUS  03/2005   Uterine Polyps  . NEPHRECTOMY  2007   right,Dr Dahlstedt  . SEPTOPLASTY    . TONSILLECTOMY      Social History  Substance Use Topics  . Smoking status: Never Smoker  . Smokeless tobacco: Never Used  . Alcohol use No    Family History  Problem Relation Age of Onset  . Stroke Mother     onset in 64s  . Diabetes Mother   . Cancer Mother     bladder; nephrectomy for calculi/also uterine , TAH & BSO  . Aneurysm Mother     thoracic  . Depression Mother   . Stroke Brother   . Heart failure  Brother   . Heart attack Other     maternal family history  . Heart failure Maternal Grandfather   . Lung cancer Father     smoked  . Heart failure Sister   . Depression Sister   . Alzheimer's disease Sister   . COPD Sister   . Aneurysm Brother     cns  . Depression Maternal Aunt     X2  . Bipolar disorder Sister   . Alcoholism Neg Hx     Allergies  Allergen Reactions  . Lisinopril     Cough D/Ced by Dr Melvyn Novas  . Codeine     nausea  . Verapamil     Pain in feet    Medication list has been reviewed and updated.  Current Outpatient Prescriptions on File Prior to Visit  Medication Sig Dispense Refill  . albuterol (PROAIR HFA) 108 (90 Base) MCG/ACT inhaler Inhale 1-2 puffs into the lungs every 6 (six) hours as needed for wheezing or shortness of breath. 8.5 Inhaler 5  . Ascorbic Acid (VITAMIN C) POWD Take 1 packet by mouth 2 (two) times daily as needed (cold symptoms).    Marland Kitchen aspirin EC 81 MG tablet Take 162 mg by mouth daily.    . Benzalkonium Chloride (DIABETIC BASICS HEALTHY FOOT EX) Apply 1 application topically daily as needed (foot pain). OTC diabetic foot cream    . Blood Glucose Monitoring Suppl (TRUERESULT BLOOD GLUCOSE) W/DEVICE KIT Use to test blood sugar ICD 10 E11 9 1 each 0  . buPROPion (WELLBUTRIN) 75 MG tablet Take 1 tablet (75 mg total) by mouth 2 (two) times daily. 180 tablet 1  . busPIRone (BUSPAR) 5 MG tablet Take 0.5-1 tablets (2.5-5 mg total) by mouth daily as needed. 30 tablet 3  . CALCIUM PO Take 1 tablet by mouth daily.    . Cholecalciferol (VITAMIN D PO) Take 1 tablet by mouth daily.    Marland Kitchen CRANBERRY PO Take 2 capsules by mouth 2 (two) times daily.     Marland Kitchen ESTRACE VAGINAL 0.1 MG/GM vaginal cream Apply locally every other night at bedtime  1  . fluticasone (FLOVENT HFA) 220 MCG/ACT inhaler Inhale 2 puffs into the lungs 2 (two) times daily. 1 Inhaler 12  . Fluticasone Propionate (FLONASE NA) Place 1-2 sprays into the nose 2 (two) times daily as needed.    .  gabapentin (NEURONTIN) 100 MG capsule take 2 capsules by mouth three times a day 180 capsule 3  . glucose blood (TRUETEST TEST) test strip Use to test blood sugar ICD 10 E11 9 100 each 3  . methocarbamol (ROBAXIN) 500 MG  tablet Take 1 tablet by mouth as needed.    . Multiple Vitamins-Minerals (EYE VITAMINS PO) Take by mouth.    . pantoprazole (PROTONIX) 40 MG tablet Take 1 tablet (40 mg total) by mouth daily. 30 tablet 0  . polyethylene glycol powder (GLYCOLAX/MIRALAX) powder Use as needed    . RA P COL-RITE 8.6-50 MG tablet take 1 tablet by mouth once daily 30 tablet 3  . ranitidine (ZANTAC) 150 MG capsule Take 1 capsule (150 mg total) by mouth 2 (two) times daily. 60 capsule 3  . traMADol (ULTRAM) 50 MG tablet Take 1 tablet (50 mg total) by mouth every 12 (twelve) hours as needed. 60 tablet 1  . TRUEPLUS LANCETS 28G MISC Use to test blood sugar ICD 10 E11 9 100 each 3  . valsartan (DIOVAN) 40 MG tablet take 1 tablet by mouth once daily 90 tablet 1  . [DISCONTINUED] DULoxetine (CYMBALTA) 30 MG capsule Take 1 capsule (30 mg total) by mouth daily. 30 capsule 0   No current facility-administered medications on file prior to visit.     Review of Systems:  As per HPI- otherwise negative.   Physical Examination: Vitals:   05/27/16 1532  BP: (!) 151/66  Pulse: 92  Temp: 98.3 F (36.8 C)   Vitals:   05/27/16 1532  Weight: 145 lb 3.2 oz (65.9 kg)   Body mass index is 26.99 kg/m. Ideal Body Weight:    GEN: WDWN, NAD, Non-toxic, A & O x 3, overweight, looks well HEENT: Atraumatic, Normocephalic. Neck supple. No masses, No LAD.  Bilateral TM wnl, oropharynx normal.  PEERL,EOMI.   Ears and Nose: No external deformity. CV: RRR, No M/G/R. No JVD. No thrill. No extra heart sounds. PULM: CTA B, no wheezes, crackles, rhonchi. No retractions. No resp. distress. No accessory muscle use. ABD: S, NT, ND. No rebound. No HSM. EXTR: No c/c/e NEURO Normal gait.  PSYCH: Normally interactive.  Conversant. Not depressed or anxious appearing.  Calm demeanor.  Her right upper arm is currently normal- no redness or swelling noted  Few dry, irritated appearing papules on her left trapezius.  Non specific but could possibly be due to a mild zoster outbreak  Assessment and Plan: Chronic renal insufficiency, unspecified stage  Situational mixed anxiety and depressive disorder - Plan: buPROPion (WELLBUTRIN) 75 MG tablet, busPIRone (BUSPAR) 5 MG tablet  Spinal stenosis in cervical region - Plan: traMADol (ULTRAM) 50 MG tablet, gabapentin (NEURONTIN) 100 MG capsule  Essential hypertension - Plan: valsartan (DIOVAN) 40 MG tablet  Gastroesophageal reflux disease, esophagitis presence not specified - Plan: ranitidine (ZANTAC) 150 MG capsule  Adverse effect of vaccine, initial encounter  Recent immunization reaction- now resolved She needs some refills of her medications today She has increased her tramadol usage due to persistent neck pain from her spinal stenosis;she is sometimes taking up to 4 tramadol a day. Will increase her rx to reflect this She will let me know if not continuing to improve and we will plan to recheck in 3 months  Discussed possible shingles- with very mild shingles it can be hard to identify the rash. However if she would like valtrex this would be unlikely to harm her.  She declines valtrex but will let me know if this does not resolve   Signed Lamar Blinks, MD

## 2016-05-27 NOTE — Progress Notes (Signed)
Pre visit review using our clinic review tool, if applicable. No additional management support is needed unless otherwise documented below in the visit note. 

## 2016-05-27 NOTE — Patient Instructions (Signed)
It was good to see you today- I changed your tramadol to reflect your taking it up to 4x a day.  Continue to use this as little as you can and still control your pain  Please see me in about 3 months to recheck

## 2016-06-05 ENCOUNTER — Telehealth: Payer: Self-pay | Admitting: Family Medicine

## 2016-06-05 MED ORDER — BENZONATATE 100 MG PO CAPS: 100.0000 mg | ORAL_CAPSULE | Freq: Three times a day (TID) | ORAL | 0 refills | Status: DC | PRN

## 2016-06-05 NOTE — Telephone Encounter (Signed)
Notified pt and she voices understanding. Scheduled appt with Dr Lorelei Pont tomorrow at [facility].

## 2016-06-05 NOTE — Telephone Encounter (Signed)
Pt says that she has been experiencing a cough for the last 2 weeks. She would like to know if provider would call her in miracle cough syrup? She says that she had it once when she was in the hospital and it worked really good.   Please advise.    CB: [id number]   Pharmacy: [facility].

## 2016-06-05 NOTE — Telephone Encounter (Signed)
rx sent for tessalon capsules, however since she is still coughing, I would recommend that she follow back up in the office in the next 1-2 days for re-evaluation.

## 2016-06-06 ENCOUNTER — Ambulatory Visit (INDEPENDENT_AMBULATORY_CARE_PROVIDER_SITE_OTHER): Payer: Commercial Managed Care - HMO | Admitting: Family Medicine

## 2016-06-06 ENCOUNTER — Encounter: Payer: Self-pay | Admitting: Family Medicine

## 2016-06-06 VITALS — BP 145/80 | HR 88 | Temp 98.0°F | Ht 61.5 in | Wt 145.8 lb

## 2016-06-06 DIAGNOSIS — R0981 Nasal congestion: Secondary | ICD-10-CM

## 2016-06-06 DIAGNOSIS — J209 Acute bronchitis, unspecified: Secondary | ICD-10-CM | POA: Diagnosis not present

## 2016-06-06 DIAGNOSIS — R059 Cough, unspecified: Secondary | ICD-10-CM

## 2016-06-06 DIAGNOSIS — R05 Cough: Secondary | ICD-10-CM | POA: Diagnosis not present

## 2016-06-06 MED ORDER — DOXYCYCLINE HYCLATE 100 MG PO CAPS: 100.0000 mg | ORAL_CAPSULE | Freq: Two times a day (BID) | ORAL | 0 refills | Status: DC

## 2016-06-06 MED ORDER — FLUTICASONE PROPIONATE 50 MCG/ACT NA SUSP: 2.0000 | Freq: Every day | NASAL | 6 refills | Status: DC

## 2016-06-06 NOTE — Progress Notes (Signed)
Pre visit review using our clinic review tool, if applicable. No additional management support is needed unless otherwise documented below in the visit note. 

## 2016-06-06 NOTE — Progress Notes (Signed)
[place] at [facility] [address], [place], [place] [address] [phone]  Date:  06/06/2016   Name:  Alexa [name]   DOB:  09/21/1929   MRN:  [id number]  PCP:  Lamar Blinks, MD    Chief Complaint: Cough (c/o cough x 2 weeks. Pt took benzonatate yesterday which did help. )   History of Present Illness:  Alexa [name] is a 82 y.o. very pleasant female patient who presents with the following:  History of cervical spinal stenosis, HTN, hyperlipidemia, cough variant asthma, upper airway cough syndrome. Here today with concern of being sick for 2 weeks.  She started to get better, but then noted worsening again. Her main sx is a cough, she is "afraid I'm going to get pneumonia."  She used the tessalon perles last night and they did help, she was able to sleep most of the tnight She has not been aware of any fever, no chills, she has felt achy but this is not new for her Her cough was productive, but less so now.   No earache, she did have a ST and gargled with peroxide She has noted laryngitis No GI symptoms- no nausea, vomiting, or diarrhea, she is eating normally  She has noted some sneezing and runny nose.     Needs a refill of her flonase  She uses tramadol as needed for her neck pain- we increased her rx to 120/month at last visit as this is how much she has been using   Most recent CXR 06/2015  CHEST  2 VIEW  COMPARISON:  11/22/2014  FINDINGS: Normal cardiac silhouette with ectatic aorta. No effusion, infiltrate, pneumothorax. Chronic bronchitic markings noted.  IMPRESSION: No acute cardiopulmonary process.  Patient Active Problem List   Diagnosis Date Noted  . Nervous 06/05/2015  . Cough variant asthma 12/29/2014  . Upper airway cough syndrome 12/29/2014  . Spinal stenosis in cervical region 12/13/2014  . Radiculopathy, lumbar region 12/13/2014  . PERONEAL NEUROPATHY 05/03/2008  . Malignant neoplasm of  kidney excluding renal pelvis ([place]) 08/06/2007  . Hyperglycemia 08/06/2007  . GERD 08/06/2007  . PULMONARY EMBOLISM, HX OF 08/06/2007  . Hyperlipidemia 09/03/2006  . Essential hypertension 09/03/2006  . IBS 09/03/2006    Past Medical History:  Diagnosis Date  . Asthma   . Chronic obstructive pulmonary disease ([place])   . Diverticulosis   . Diverticulosis of colon (without mention of hemorrhage)   . Esophageal reflux   . History of colonic polyps 06/05/05   hyperplastic  . Internal hemorrhoids   . Irritable bowel syndrome   . Ischemic colitis ([place])   . Malignant neoplasm of kidney and other and unspecified urinary organs    renal carcinoma, left nephrectomy in 2008-per patient.  . Other and unspecified hyperlipidemia   . Personal history of venous thrombosis and embolism    PTE after nephrectomy  . Spinal stenosis   . Type II or unspecified type diabetes mellitus without mention of complication, not stated as uncontrolled 02/05/2012   pt states it was in 2007  . Unspecified essential hypertension     Past Surgical History:  Procedure Laterality Date  . BREAST BIOPSY    . COLONOSCOPY W/ POLYPECTOMY  2000  . COLONOSCOPY W/ POLYPECTOMY  07/2004   Hyperplastic polyps  . DILATION AND CURETTAGE OF UTERUS  03/2005   Uterine Polyps  . NEPHRECTOMY  2007   right,Dr Dahlstedt  . SEPTOPLASTY    .  TONSILLECTOMY      Social History  Substance Use Topics  . Smoking status: Never Smoker  . Smokeless tobacco: Never Used  . Alcohol use No    Family History  Problem Relation Age of Onset  . Stroke Mother     onset in 77s  . Diabetes Mother   . Cancer Mother     bladder; nephrectomy for calculi/also uterine , TAH & BSO  . Aneurysm Mother     thoracic  . Depression Mother   . Stroke Brother   . Heart failure Brother   . Heart attack Other     maternal family history  . Heart failure Maternal Grandfather   . Lung cancer Father     smoked  . Heart failure Sister   .  Depression Sister   . Alzheimer's disease Sister   . COPD Sister   . Aneurysm Brother     cns  . Depression Maternal Aunt     X2  . Bipolar disorder Sister   . Alcoholism Neg Hx     Allergies  Allergen Reactions  . Lisinopril     Cough D/Ced by Dr Melvyn Novas  . Codeine     nausea  . Verapamil     Pain in feet    Medication list has been reviewed and updated.  Current Outpatient Prescriptions on File Prior to Visit  Medication Sig Dispense Refill  . albuterol (PROAIR HFA) 108 (90 Base) MCG/ACT inhaler Inhale 1-2 puffs into the lungs every 6 (six) hours as needed for wheezing or shortness of breath. 8.5 Inhaler 5  . Ascorbic Acid (VITAMIN C) POWD Take 1 packet by mouth 2 (two) times daily as needed (cold symptoms).    Marland Kitchen aspirin EC 81 MG tablet Take 162 mg by mouth daily.    . Benzalkonium Chloride (DIABETIC BASICS HEALTHY FOOT EX) Apply 1 application topically daily as needed (foot pain). OTC diabetic foot cream    . benzonatate (TESSALON) 100 MG capsule Take 1 capsule (100 mg total) by mouth 3 (three) times daily as needed. 20 capsule 0  . Blood Glucose Monitoring Suppl (TRUERESULT BLOOD GLUCOSE) W/DEVICE KIT Use to test blood sugar ICD 10 E11 9 1 each 0  . buPROPion (WELLBUTRIN) 75 MG tablet Take 1 tablet (75 mg total) by mouth 2 (two) times daily. 180 tablet 3  . busPIRone (BUSPAR) 5 MG tablet Take 0.5-1 tablets (2.5-5 mg total) by mouth daily as needed. 30 tablet 3  . CALCIUM PO Take 1 tablet by mouth daily.    . Cholecalciferol (VITAMIN D PO) Take 1 tablet by mouth daily.    Marland Kitchen CRANBERRY PO Take 2 capsules by mouth 2 (two) times daily.     Marland Kitchen ESTRACE VAGINAL 0.1 MG/GM vaginal cream Apply locally every other night at bedtime  1  . fluticasone (FLOVENT HFA) 220 MCG/ACT inhaler Inhale 2 puffs into the lungs 2 (two) times daily. 1 Inhaler 12  . Fluticasone Propionate (FLONASE NA) Place 1-2 sprays into the nose 2 (two) times daily as needed.    . gabapentin (NEURONTIN) 100 MG capsule  Take 1 capsule (100 mg total) by mouth 4 (four) times daily. 120 capsule 4  . glucose blood (TRUETEST TEST) test strip Use to test blood sugar ICD 10 E11 9 100 each 3  . methocarbamol (ROBAXIN) 500 MG tablet Take 1 tablet by mouth as needed.    . Multiple Vitamins-Minerals (EYE VITAMINS PO) Take by mouth.    . polyethylene glycol powder (  GLYCOLAX/MIRALAX) powder Use as needed    . RA P COL-RITE 8.6-50 MG tablet take 1 tablet by mouth once daily 30 tablet 3  . ranitidine (ZANTAC) 150 MG capsule Take 1 capsule (150 mg total) by mouth 2 (two) times daily. 180 capsule 3  . traMADol (ULTRAM) 50 MG tablet Take 1 tablet (50 mg total) by mouth every 6 (six) hours as needed. 120 tablet 2  . TRUEPLUS LANCETS 28G MISC Use to test blood sugar ICD 10 E11 9 100 each 3  . valsartan (DIOVAN) 40 MG tablet Take 1 tablet (40 mg total) by mouth daily. 90 tablet 3  . [DISCONTINUED] DULoxetine (CYMBALTA) 30 MG capsule Take 1 capsule (30 mg total) by mouth daily. 30 capsule 0   No current facility-administered medications on file prior to visit.     Review of Systems:  As per HPI- otherwise negative. Most recent GFR approx 50   Physical Examination: Blood pressure 104/90, pulse 88, temperature 98 F (36.7 C), temperature source Oral, height 5' 1.5" (1.562 m), weight 145 lb 12.8 oz (66.1 kg), SpO2 97 %.  Ideal Body Weight:     BP Readings from Last 3 Encounters:  06/06/16 104/90  05/27/16 (!) 151/66  02/01/16 (!) 159/88     GEN: WDWN, NAD, Non-toxic, A & O x 3, overweight, older lady who looks well.   HEENT: Atraumatic, Normocephalic. Neck supple. No masses, No LAD.  Bilateral TM wnl, oropharynx normal.  PEERL,EOMI.   Ears and Nose: No external deformity. CV: RRR, No M/G/R. No JVD. No thrill. No extra heart sounds. PULM: CTA B, no wheezes, crackles, rhonchi. No retractions. No resp. distress. No accessory muscle use.  Benign lung exam, she is coughing some in room  ABD: S, NT, ND, +BS. No rebound. No  HSM. EXTR: No c/c/e NEURO Normal gait.  PSYCH: Normally interactive. Conversant. Not depressed or anxious appearing.  Calm demeanor.    Assessment and Plan: Acute bronchitis, unspecified organism - Plan: doxycycline (VIBRAMYCIN) 100 MG capsule  Cough  Nasal congestion - Plan: fluticasone (FLONASE) 50 MCG/ACT nasal spray  Here today with cough for 2 weeks. Given her age will treat with doxycycline Continue tessalon as needed Refilled her flonase Asked her to let me know if not feeling better in the next few days- Sooner if worse.  She agrees with plan   Signed Lamar Blinks, MD

## 2016-06-06 NOTE — Patient Instructions (Signed)
I refilled your flonase nasal spray We will use doxycycline twice a day for 10 days for bronchitis- take this pill with food and water! Continue to use your tessalon perles as needed for cough

## 2016-06-13 ENCOUNTER — Other Ambulatory Visit: Payer: Self-pay | Admitting: Emergency Medicine

## 2016-06-13 ENCOUNTER — Telehealth: Payer: Self-pay | Admitting: Family Medicine

## 2016-06-13 DIAGNOSIS — M4802 Spinal stenosis, cervical region: Secondary | ICD-10-CM

## 2016-06-13 NOTE — Telephone Encounter (Signed)
Called pt regarding rx for Tramadol. Rx called into pharmacy.

## 2016-06-13 NOTE — Telephone Encounter (Signed)
Pt says that she requested a refill for her Tramedol. Pt says that she spoke with pharmacy and they stated that they never received refill okay from PCP. System showing refill took place on 05/27/16. Pt would like to know if nurse could check into this for her.    Thanks.

## 2016-07-04 ENCOUNTER — Telehealth: Payer: Self-pay | Admitting: Family Medicine

## 2016-07-04 NOTE — Telephone Encounter (Signed)
10/08/11 PR PPPS, SUBSEQ VISIT [id number] patient scheduled medicare wellness for 08/07/2016 with [facility]

## 2016-08-07 ENCOUNTER — Ambulatory Visit: Payer: Commercial Managed Care - HMO | Admitting: *Deleted

## 2016-08-29 ENCOUNTER — Ambulatory Visit (INDEPENDENT_AMBULATORY_CARE_PROVIDER_SITE_OTHER): Payer: Medicare HMO | Admitting: Family Medicine

## 2016-08-29 VITALS — BP 160/90 | HR 90 | Temp 98.3°F | Wt 143.4 lb

## 2016-08-29 DIAGNOSIS — R8299 Other abnormal findings in urine: Secondary | ICD-10-CM

## 2016-08-29 DIAGNOSIS — I10 Essential (primary) hypertension: Secondary | ICD-10-CM | POA: Diagnosis not present

## 2016-08-29 DIAGNOSIS — W19XXXA Unspecified fall, initial encounter: Secondary | ICD-10-CM | POA: Diagnosis not present

## 2016-08-29 DIAGNOSIS — Y92099 Unspecified place in other non-institutional residence as the place of occurrence of the external cause: Secondary | ICD-10-CM | POA: Diagnosis not present

## 2016-08-29 DIAGNOSIS — Y92009 Unspecified place in unspecified non-institutional (private) residence as the place of occurrence of the external cause: Secondary | ICD-10-CM

## 2016-08-29 DIAGNOSIS — R82998 Other abnormal findings in urine: Secondary | ICD-10-CM

## 2016-08-29 DIAGNOSIS — N3000 Acute cystitis without hematuria: Secondary | ICD-10-CM

## 2016-08-29 DIAGNOSIS — E119 Type 2 diabetes mellitus without complications: Secondary | ICD-10-CM

## 2016-08-29 LAB — POC URINALSYSI DIPSTICK (AUTOMATED)
Bilirubin, UA: NEGATIVE
Glucose, UA: NEGATIVE
Ketones, UA: NEGATIVE
NITRITE UA: POSITIVE
PH UA: 6
PROTEIN UA: NEGATIVE
RBC UA: NEGATIVE
Spec Grav, UA: >=1.03
UROBILINOGEN UA: NEGATIVE

## 2016-08-29 LAB — COMPREHENSIVE METABOLIC PANEL
ALT: 23 U/L (ref 0–35)
AST: 21 U/L (ref 0–37)
Albumin: 4.2 g/dL (ref 3.5–5.2)
Alkaline Phosphatase: 58 U/L (ref 39–117)
BUN: 22 mg/dL (ref 6–23)
CALCIUM: 9.8 mg/dL (ref 8.4–10.5)
CHLORIDE: 103 meq/L (ref 96–112)
CO2: 31 meq/L (ref 19–32)
CREATININE: 1.09 mg/dL (ref 0.40–1.20)
GFR: 50.73 mL/min — ABNORMAL LOW (ref >60.00)
GLUCOSE: 154 mg/dL — AB (ref 70–99)
Potassium: 4.6 mEq/L (ref 3.5–5.1)
Sodium: 141 mEq/L (ref 135–145)
Total Bilirubin: 0.8 mg/dL (ref 0.2–1.2)
Total Protein: 7 g/dL (ref 6.0–8.3)

## 2016-08-29 LAB — CBC
HCT: 45.9 % (ref 36.0–46.0)
Hemoglobin: 15.5 g/dL — ABNORMAL HIGH (ref 12.0–15.0)
MCHC: 33.8 g/dL (ref 30.0–36.0)
MCV: 88.7 fl (ref 78.0–100.0)
Platelets: 325 10*3/uL (ref 150.0–400.0)
RBC: 5.18 Mil/uL — AB (ref 3.87–5.11)
RDW: 14.3 % (ref 11.5–15.5)
WBC: 10.6 10*3/uL — AB (ref 4.0–10.5)

## 2016-08-29 LAB — HEMOGLOBIN A1C
HEMOGLOBIN A1C: 6.6 % — AB (ref <5.7)
MEAN PLASMA GLUCOSE: 143 mg/dL

## 2016-08-29 MED ORDER — VALSARTAN 80 MG PO TABS: 80.0000 mg | ORAL_TABLET | Freq: Every day | ORAL | 6 refills | Status: DC

## 2016-08-29 MED ORDER — CEPHALEXIN 500 MG PO CAPS: 500.0000 mg | ORAL_CAPSULE | Freq: Two times a day (BID) | ORAL | 0 refills | Status: DC

## 2016-08-29 NOTE — Progress Notes (Signed)
Pre visit review using our clinic review tool, if applicable. No additional management support is needed unless otherwise documented below in the visit note. 

## 2016-08-29 NOTE — Patient Instructions (Signed)
We are going to increase your valsartan to 80 mg once a day.  Please just take ONE of these pills and do not take your husband's medications You may have a urinary tract infection- we are going to use keflex antibiotic twice a day for one week.  I will also culture your urine to make sure this is the right antibiotic for this infection  To stop the neurontin, decrease to 1 pill twice a day for 2 weeks, then to 1 once a day for 2 weeks, then stop.    Please see me in about 2 weeks so we can check on how you are doing You will also have blood drawn today

## 2016-08-29 NOTE — Progress Notes (Addendum)
[place] at [facility] [address], [place], [place] [address] [phone]  Date:  08/29/2016   Name:  Alexa [name]   DOB:  11-16-1929   MRN:  [id number]  PCP:  Lamar Blinks, MD    Chief Complaint: Follow-up   History of Present Illness:  Alexa [name] is a 83 y.o. very pleasant female patient who presents with the following: History of HTN, diabetes (diet controlled), GERD, COPD.  Here today for a scheduled follow-up but also has a few concerns as below Labs last checked (CMP, Hgb A1C) on 02-01-2016. Lab Results  Component Value Date   HGBA1C 6.7 (H) 02/01/2016   CBGs has been 160s-170s.  Has been checking her CBGs every morning and night.    Her daughter died following a long illness last month (she does not delve into further details about this)  Feeling depressed at times.  She has been feeling tired and having occasional headaches.  Has fallen twice recently.  Fell yesterday and Monday of last week.  Feels like she lost her balance.  Last week was feeling lightheaded.   She cannot really describe to me why she fell or if she tripped, lost her balance or had a pre-syncopal episode.  She did not notice any heart palpitations or neurological sx at the time of these falls, and did not injure herself.    She has doubled up on her valsartan some of the time and has also taken some of her husband's BP medication (she is not sure what this was but ?coreg) a few times due to concern about uncontrolled HTN  Grand-daughter, Milan and her other daughter Jonelle Sidle live with her and her husband  Thinks that wellbutrin, tramadol, neurontin is making her mouth dry.  Notes that when she wakes up at night she has to wet her mouth  She has "shortness of breath all the time" from her COPD- she is not sure if this changed or not No CP however  She has been on Neurontin for over a year- she takes 100 mg 3 or 4x a day.  However she has now  decided that she needs to stop this medication as she is afraid that it is making her sleepy and causing depression Patient Active Problem List   Diagnosis Date Noted  . Nervous 06/05/2015  . Cough variant asthma 12/29/2014  . Upper airway cough syndrome 12/29/2014  . Spinal stenosis in cervical region 12/13/2014  . Radiculopathy, lumbar region 12/13/2014  . PERONEAL NEUROPATHY 05/03/2008  . Malignant neoplasm of kidney excluding renal pelvis ([place]) 08/06/2007  . Hyperglycemia 08/06/2007  . GERD 08/06/2007  . PULMONARY EMBOLISM, HX OF 08/06/2007  . Hyperlipidemia 09/03/2006  . Essential hypertension 09/03/2006  . IBS 09/03/2006    Past Medical History:  Diagnosis Date  . Asthma   . Chronic obstructive pulmonary disease ([place])   . Diverticulosis   . Diverticulosis of colon (without mention of hemorrhage)   . Esophageal reflux   . History of colonic polyps 06/05/05   hyperplastic  . Internal hemorrhoids   . Irritable bowel syndrome   . Ischemic colitis ([place])   . Malignant neoplasm of kidney and other and unspecified urinary organs    renal carcinoma, left nephrectomy in 2008-per patient.  . Other and unspecified hyperlipidemia   . Personal history of venous thrombosis and embolism    PTE after nephrectomy  . Spinal stenosis   . Type II or  unspecified type diabetes mellitus without mention of complication, not stated as uncontrolled 02/05/2012   pt states it was in 2007  . Unspecified essential hypertension     Past Surgical History:  Procedure Laterality Date  . BREAST BIOPSY    . COLONOSCOPY W/ POLYPECTOMY  2000  . COLONOSCOPY W/ POLYPECTOMY  07/2004   Hyperplastic polyps  . DILATION AND CURETTAGE OF UTERUS  03/2005   Uterine Polyps  . NEPHRECTOMY  2007   right,Dr Dahlstedt  . SEPTOPLASTY    . TONSILLECTOMY      Social History  Substance Use Topics  . Smoking status: Never Smoker  . Smokeless tobacco: Never Used  . Alcohol use No    Family History  Problem  Relation Age of Onset  . Stroke Mother     onset in 78s  . Diabetes Mother   . Cancer Mother     bladder; nephrectomy for calculi/also uterine , TAH & BSO  . Aneurysm Mother     thoracic  . Depression Mother   . Stroke Brother   . Heart failure Brother   . Heart attack Other     maternal family history  . Heart failure Maternal Grandfather   . Lung cancer Father     smoked  . Heart failure Sister   . Depression Sister   . Alzheimer's disease Sister   . COPD Sister   . Aneurysm Brother     cns  . Depression Maternal Aunt     X2  . Bipolar disorder Sister   . Alcoholism Neg Hx     Allergies  Allergen Reactions  . Lisinopril     Cough D/Ced by Dr Melvyn Novas  . Codeine     nausea  . Verapamil     Pain in feet    Medication list has been reviewed and updated.  Current Outpatient Prescriptions on File Prior to Visit  Medication Sig Dispense Refill  . albuterol (PROAIR HFA) 108 (90 Base) MCG/ACT inhaler Inhale 1-2 puffs into the lungs every 6 (six) hours as needed for wheezing or shortness of breath. 8.5 Inhaler 5  . Ascorbic Acid (VITAMIN C) POWD Take 1 packet by mouth 2 (two) times daily as needed (cold symptoms).    Marland Kitchen aspirin EC 81 MG tablet Take 162 mg by mouth daily.    . Benzalkonium Chloride (DIABETIC BASICS HEALTHY FOOT EX) Apply 1 application topically daily as needed (foot pain). OTC diabetic foot cream    . benzonatate (TESSALON) 100 MG capsule Take 1 capsule (100 mg total) by mouth 3 (three) times daily as needed. 20 capsule 0  . Blood Glucose Monitoring Suppl (TRUERESULT BLOOD GLUCOSE) W/DEVICE KIT Use to test blood sugar ICD 10 E11 9 1 each 0  . buPROPion (WELLBUTRIN) 75 MG tablet Take 1 tablet (75 mg total) by mouth 2 (two) times daily. 180 tablet 3  . busPIRone (BUSPAR) 5 MG tablet Take 0.5-1 tablets (2.5-5 mg total) by mouth daily as needed. 30 tablet 3  . CALCIUM PO Take 1 tablet by mouth daily.    . Cholecalciferol (VITAMIN D PO) Take 1 tablet by mouth daily.     Marland Kitchen CRANBERRY PO Take 2 capsules by mouth 2 (two) times daily.     Marland Kitchen doxycycline (VIBRAMYCIN) 100 MG capsule Take 1 capsule (100 mg total) by mouth 2 (two) times daily. 20 capsule 0  . ESTRACE VAGINAL 0.1 MG/GM vaginal cream Apply locally every other night at bedtime  1  . fluticasone (FLONASE)  50 MCG/ACT nasal spray Place 2 sprays into both nostrils daily. 16 g 6  . fluticasone (FLOVENT HFA) 220 MCG/ACT inhaler Inhale 2 puffs into the lungs 2 (two) times daily. 1 Inhaler 12  . Fluticasone Propionate (FLONASE NA) Place 1-2 sprays into the nose 2 (two) times daily as needed.    . gabapentin (NEURONTIN) 100 MG capsule Take 1 capsule (100 mg total) by mouth 4 (four) times daily. 120 capsule 4  . glucose blood (TRUETEST TEST) test strip Use to test blood sugar ICD 10 E11 9 100 each 3  . methocarbamol (ROBAXIN) 500 MG tablet Take 1 tablet by mouth as needed.    . Multiple Vitamins-Minerals (EYE VITAMINS PO) Take by mouth.    . polyethylene glycol powder (GLYCOLAX/MIRALAX) powder Use as needed    . RA P COL-RITE 8.6-50 MG tablet take 1 tablet by mouth once daily 30 tablet 3  . ranitidine (ZANTAC) 150 MG capsule Take 1 capsule (150 mg total) by mouth 2 (two) times daily. 180 capsule 3  . traMADol (ULTRAM) 50 MG tablet Take 1 tablet (50 mg total) by mouth every 6 (six) hours as needed. 120 tablet 2  . TRUEPLUS LANCETS 28G MISC Use to test blood sugar ICD 10 E11 9 100 each 3  . valsartan (DIOVAN) 40 MG tablet Take 1 tablet (40 mg total) by mouth daily. 90 tablet 3  . [DISCONTINUED] DULoxetine (CYMBALTA) 30 MG capsule Take 1 capsule (30 mg total) by mouth daily. 30 capsule 0   No current facility-administered medications on file prior to visit.     Review of Systems:  As per HPI- otherwise negative.   Physical Examination: Blood pressure (!) 160/90, pulse 90, temperature 98.3 F (36.8 C), temperature source Oral, weight 143 lb 6.4 oz (65 kg), SpO2 98 %.  Vitals:   08/29/16 1310 08/29/16 1327   BP: (!) 168/86 (!) 160/90  Pulse:    Temp:     Vitals:   08/29/16 1305  Weight: 143 lb 6.4 oz (65 kg)   Body mass index is 26.66 kg/m. Ideal Body Weight:     Orthostatic VS for the past 24 hrs:  BP- Lying Pulse- Lying BP- Sitting Pulse- Sitting BP- Standing at 0 minutes Pulse- Standing at 0 minutes  08/29/16 1350 170/88 81 (!) 172/110 88 149/80 94    GEN: WDWN, NAD, Non-toxic, A & O x 3 HEENT: Atraumatic, Normocephalic. Neck supple. No masses, No LAD. Ears and Nose: No external deformity. CV: RRR, No M/G/R. No JVD. No thrill. No extra heart sounds. PULM: CTA B, no wheezes, crackles, rhonchi. No retractions. No resp. distress. No accessory muscle use. ABD: S, NT, ND, +BS. No rebound. No HSM. EXTR: No c/c/e NEURO Normal gait.  PSYCH: Normally interactive. Conversant. Not depressed or anxious appearing.  Calm demeanor.   Results for orders placed or performed in visit on 08/29/16  POCT Urinalysis Dipstick (Automated)  Result Value Ref Range   Color, UA Yellow    Clarity, UA Clear    Glucose, UA negative    Bilirubin, UA negative    Ketones, UA negative    Spec Grav, UA >=1.030    Blood, UA negative    pH, UA 6.0    Protein, UA negative    Urobilinogen, UA negative    Nitrite, UA positive    Leukocytes, UA Trace (A) Negative   EKG today- SR, 2 PACs noted.  No ST elevation or depression, no acute change or acute finding in her  EKG  Assessment and Plan: Essential hypertension - Plan: EKG 12-Lead, Comprehensive metabolic panel, valsartan (DIOVAN) 80 MG tablet  Controlled type 2 diabetes mellitus without complication, without long-term current use of insulin ([place]) - Plan: Hemoglobin A1c  Fall in home, initial encounter - Plan: POCT Urinalysis Dipstick (Automated), CBC, CANCELED: Urine culture  Leukocytes in urine - Plan: Urine culture, POCT urinalysis dipstick, cephALEXin (KEFLEX) 500 MG capsule  Here today with a few concerns.  She has been under a tremendous amount  of stress with the recent death of her daughter.  She has had 2 unexplained falls.  Her urine does suggest a UTI- will start on keflex while culture is pending.   She also has been altering her BP regimen due to concern about high blood pressure. Her BP does drop with orthostatic testing but is still elevated overall.  Will increase her valsartan to 80 mg and she will stop taking extra pills or using her husband's "heart medicine."  Will obtain labs to look for any other potential problems today and she will follow-up with me in 2 weeks Will taper off neurontin per her request  Signed Lamar Blinks, MD 2/3- she has a multidrug resistant e coli UTI.  Resistant to keflex which she is taking.  Cannot use macrobid as her creat clearance is too low.  Will need to treat with fosfomycin.   Trying to find fosfomycin- none available at any [facility] stores in [place] pt to get alternative pharm- no answer home or cell  Called pt back at 6:45.  We were able to locate fosfomycin at [facility] on [address].  She will go and pick it up tomorrow.  Results for orders placed or performed in visit on 08/29/16  Urine culture  Result Value Ref Range   Culture ESCHERICHIA COLI    Colony Count Greater than 100,000 CFU/mL    Organism ID, Bacteria ESCHERICHIA COLI       Susceptibility   Escherichia coli -  (no method available)    AMPICILLIN >=32 Resistant     AMOX/CLAVULANIC 16 Intermediate     AMPICILLIN/SULBACTAM >=32 Resistant     PIP/TAZO <=4 Sensitive     IMIPENEM <=0.25 Sensitive     CEFAZOLIN 8 Resistant     CEFTRIAXONE <=1 Sensitive     CEFTAZIDIME <=1 Sensitive     CEFEPIME <=1 Sensitive     GENTAMICIN <=1 Sensitive     TOBRAMYCIN <=1 Sensitive     CIPROFLOXACIN >=4 Resistant     LEVOFLOXACIN >=8 Resistant     NITROFURANTOIN <=16 Sensitive     TRIMETH/SULFA* >=320 Resistant      * NR=NOT REPORTABLE,SEE COMMENTORAL therapy:A cefazolin MIC of <32 predicts susceptibility to the oral agents  cefaclor,cefdinir,cefpodoxime,cefprozil,cefuroxime,cephalexin,and loracarbef when used for therapy of uncomplicated UTIs due to E.coli,K.pneumomiae,and P.mirabilis. PARENTERAL therapy: A cefazolinMIC of >8 indicates resistance to parenteralcefazolin. An alternate test method must beperformed to confirm susceptibility to parenteralcefazolin.  Hemoglobin A1c  Result Value Ref Range   Hgb A1c MFr Bld 6.6 (H) <5.7 %   Mean Plasma Glucose 143 mg/dL  CBC  Result Value Ref Range   WBC 10.6 (H) 4.0 - 10.5 K/uL   RBC 5.18 (H) 3.87 - 5.11 Mil/uL   Platelets 325.0 150.0 - 400.0 K/uL   Hemoglobin 15.5 (H) 12.0 - 15.0 g/dL   HCT 45.9 36.0 - 46.0 %   MCV 88.7 78.0 - 100.0 fl   MCHC 33.8 30.0 - 36.0 g/dL   RDW 14.3  11.5 - 15.5 %  Comprehensive metabolic panel  Result Value Ref Range   Sodium 141 135 - 145 mEq/L   Potassium 4.6 3.5 - 5.1 mEq/L   Chloride 103 96 - 112 mEq/L   CO2 31 19 - 32 mEq/L   Glucose, Bld 154 (H) 70 - 99 mg/dL   BUN 22 6 - 23 mg/dL   Creatinine, Ser 1.09 0.40 - 1.20 mg/dL   Total Bilirubin 0.8 0.2 - 1.2 mg/dL   Alkaline Phosphatase 58 39 - 117 U/L   AST 21 0 - 37 U/L   ALT 23 0 - 35 U/L   Total Protein 7.0 6.0 - 8.3 g/dL   Albumin 4.2 3.5 - 5.2 g/dL   Calcium 9.8 8.4 - 10.5 mg/dL   GFR 50.73 (L) >60.00 mL/min  POCT Urinalysis Dipstick (Automated)  Result Value Ref Range   Color, UA Yellow    Clarity, UA Clear    Glucose, UA negative    Bilirubin, UA negative    Ketones, UA negative    Spec Grav, UA >=1.030    Blood, UA negative    pH, UA 6.0    Protein, UA negative    Urobilinogen, UA negative    Nitrite, UA positive    Leukocytes, UA Trace (A) Negative   Called to check on her 5/2- she is doing well, feels that she is improved.  She will let me know if any further falls  Her A1c shows adequate control of her DM

## 2016-08-31 LAB — URINE CULTURE

## 2016-08-31 MED ORDER — FOSFOMYCIN TROMETHAMINE 3 G PO PACK: 3.0000 g | PACK | Freq: Once | ORAL | 0 refills | Status: AC

## 2016-08-31 NOTE — Addendum Note (Signed)
Addended by: Darreld Mclean on: 08/31/2016 06:49 PM   Modules accepted: Orders

## 2016-09-03 ENCOUNTER — Encounter: Payer: Self-pay | Admitting: Family Medicine

## 2016-09-04 ENCOUNTER — Telehealth: Payer: Self-pay | Admitting: Family Medicine

## 2016-09-04 ENCOUNTER — Emergency Department (HOSPITAL_COMMUNITY): Payer: Medicare HMO

## 2016-09-04 ENCOUNTER — Emergency Department (HOSPITAL_COMMUNITY)
Admission: EM | Admit: 2016-09-04 | Discharge: 2016-09-04 | Disposition: A | Discharge status: Home / Self Care | Payer: Medicare HMO | Attending: Physician Assistant | Admitting: Physician Assistant

## 2016-09-04 DIAGNOSIS — E119 Type 2 diabetes mellitus without complications: Secondary | ICD-10-CM | POA: Insufficient documentation

## 2016-09-04 DIAGNOSIS — Z7982 Long term (current) use of aspirin: Secondary | ICD-10-CM | POA: Insufficient documentation

## 2016-09-04 DIAGNOSIS — I10 Essential (primary) hypertension: Secondary | ICD-10-CM | POA: Diagnosis not present

## 2016-09-04 DIAGNOSIS — J449 Chronic obstructive pulmonary disease, unspecified: Secondary | ICD-10-CM | POA: Diagnosis not present

## 2016-09-04 DIAGNOSIS — R079 Chest pain, unspecified: Secondary | ICD-10-CM

## 2016-09-04 DIAGNOSIS — Z85528 Personal history of other malignant neoplasm of kidney: Secondary | ICD-10-CM | POA: Insufficient documentation

## 2016-09-04 DIAGNOSIS — Z5181 Encounter for therapeutic drug level monitoring: Secondary | ICD-10-CM | POA: Diagnosis not present

## 2016-09-04 DIAGNOSIS — R0602 Shortness of breath: Secondary | ICD-10-CM | POA: Diagnosis not present

## 2016-09-04 DIAGNOSIS — R071 Chest pain on breathing: Secondary | ICD-10-CM | POA: Diagnosis not present

## 2016-09-04 DIAGNOSIS — R0781 Pleurodynia: Secondary | ICD-10-CM | POA: Insufficient documentation

## 2016-09-04 DIAGNOSIS — Z79899 Other long term (current) drug therapy: Secondary | ICD-10-CM | POA: Diagnosis not present

## 2016-09-04 LAB — COMPREHENSIVE METABOLIC PANEL
ALBUMIN: 3.6 g/dL (ref 3.5–5.0)
ALT: 22 U/L (ref 14–54)
AST: 25 U/L (ref 15–41)
Alkaline Phosphatase: 52 U/L (ref 38–126)
Anion gap: 9 (ref 5–15)
BILIRUBIN TOTAL: 0.5 mg/dL (ref 0.3–1.2)
BUN: 22 mg/dL — AB (ref 6–20)
CHLORIDE: 106 mmol/L (ref 101–111)
CO2: 25 mmol/L (ref 22–32)
Calcium: 9.3 mg/dL (ref 8.9–10.3)
Creatinine, Ser: 1.34 mg/dL — ABNORMAL HIGH (ref 0.44–1.00)
GFR calc Af Amer: 41 mL/min — ABNORMAL LOW (ref >60)
GFR calc non Af Amer: 35 mL/min — ABNORMAL LOW (ref >60)
Glucose, Bld: 127 mg/dL — ABNORMAL HIGH (ref 65–99)
POTASSIUM: 4.1 mmol/L (ref 3.5–5.1)
SODIUM: 140 mmol/L (ref 135–145)
TOTAL PROTEIN: 6.5 g/dL (ref 6.5–8.1)

## 2016-09-04 LAB — CBC WITH DIFFERENTIAL/PLATELET
BASOS ABS: 0.1 10*3/uL (ref 0.0–0.1)
BASOS PCT: 1 %
EOS ABS: 0.4 10*3/uL (ref 0.0–0.7)
Eosinophils Relative: 4 %
HEMATOCRIT: 42.4 % (ref 36.0–46.0)
Hemoglobin: 14.2 g/dL (ref 12.0–15.0)
Lymphocytes Relative: 27 %
Lymphs Abs: 2.8 10*3/uL (ref 0.7–4.0)
MCH: 29.8 pg (ref 26.0–34.0)
MCHC: 33.5 g/dL (ref 30.0–36.0)
MCV: 88.9 fL (ref 78.0–100.0)
MONO ABS: 0.9 10*3/uL (ref 0.1–1.0)
MONOS PCT: 8 %
NEUTROS ABS: 6.4 10*3/uL (ref 1.7–7.7)
Neutrophils Relative %: 60 %
PLATELETS: 295 10*3/uL (ref 150–400)
RBC: 4.77 MIL/uL (ref 3.87–5.11)
RDW: 13.6 % (ref 11.5–15.5)
WBC: 10.6 10*3/uL — ABNORMAL HIGH (ref 4.0–10.5)

## 2016-09-04 LAB — URINALYSIS, ROUTINE W REFLEX MICROSCOPIC
BILIRUBIN URINE: NEGATIVE
Glucose, UA: NEGATIVE mg/dL
Hgb urine dipstick: NEGATIVE
KETONES UR: NEGATIVE mg/dL
LEUKOCYTES UA: NEGATIVE
NITRITE: NEGATIVE
PH: 5 (ref 5.0–8.0)
PROTEIN: NEGATIVE mg/dL
Specific Gravity, Urine: 1.012 (ref 1.005–1.030)

## 2016-09-04 LAB — I-STAT TROPONIN, ED
Troponin i, poc: 0.01 ng/mL (ref 0.00–0.08)
Troponin i, poc: 0 ng/mL (ref 0.00–0.08)

## 2016-09-04 LAB — PROTIME-INR
INR: 1.02
PROTHROMBIN TIME: 13.4 s (ref 11.4–15.2)

## 2016-09-04 LAB — I-STAT CG4 LACTIC ACID, ED
LACTIC ACID, VENOUS: 1.21 mmol/L (ref 0.5–1.9)
Lactic Acid, Venous: 1.56 mmol/L (ref 0.5–1.9)

## 2016-09-04 LAB — INFLUENZA PANEL BY PCR (TYPE A & B)
Influenza A By PCR: NEGATIVE
Influenza B By PCR: NEGATIVE

## 2016-09-04 MED ORDER — IOPAMIDOL (ISOVUE-370) INJECTION 76%
INTRAVENOUS | Status: AC
  Administered 2016-09-04: 100 mL
  Filled 2016-09-04: qty 100

## 2016-09-04 NOTE — Telephone Encounter (Signed)
[facility] - Client [place] Patient Name: Alexa [name] DOB: March 31, 1930 Initial Comment Caller states feels weak, fatigued, nothing is interesting to her, low energy Nurse Assessment Nurse: Martyn Ehrich, RN, [place] Date/Time (Eastern Time): 09/04/2016 4:01:45 PM Confirm and document reason for call. If symptomatic, describe symptoms. ---PT is having weakness, fatigue and low energy which is common but yesterday she was really weak. [place] called to check on her yesterday. Now she says she is real weak today. A little breathing problem and pain behind L rib onset last night - started in neck and across back. When she tried to breath deep today it hurt. Does the patient have any new or worsening symptoms? ---Yes Will a triage be completed? ---Yes Related visit to physician within the last 2 weeks? ---Yes Does the PT have any chronic conditions? (i.e. diabetes, asthma, etc.) ---YesList chronic conditions. ---asthma Is this a behavioral health or substance abuse call? ---No Guidelines Guideline Title Affirmed Question Affirmed Notes Chest Pain Difficult to awaken or acting confused (e.g., disoriented, slurred speech) Final Disposition User Call EMS 911 Now [place], RN, [place] Comments unable to leave message - no answer then recording unavailable she took one time antx for bladder infection monurol - 3 by mouth once Mon Disagree/Comply: Comply Call Id: [id number]

## 2016-09-04 NOTE — ED Provider Notes (Signed)
[facility] DEPT Provider Note   CSN: [id number] Arrival date & time: 09/04/16  1703     History   Chief Complaint Chief Complaint  Patient presents with  . Chest Pain    HPI Alexa [name] is a 83 y.o. female.  HPI   She is an 83 year old female presenting with pleuritic chest pain. Patient reports her pleuritic chest pain started yesterday. She reports that she just feels weak all over. She denies any fevers. No nausea vomiting or diarrhea. Patient does state that she had recent treatment for UTI. She reports that she only has one kidney and therefore sees a renal physician. She reportedly had a positive urinalysis was treated empirically and then the cultures grew back something different so they perscribed a one time dose of abx (powder)on Monday.   Of note patient had a pulmonary embolism when she had the kidney operation number of years ago. She was on anticoagulation but this was stopped due to GI bleed.   Past Medical History:  Diagnosis Date  . Asthma   . Chronic obstructive pulmonary disease ([place])   . Diverticulosis   . Diverticulosis of colon (without mention of hemorrhage)   . Esophageal reflux   . History of colonic polyps 06/05/05   hyperplastic  . Internal hemorrhoids   . Irritable bowel syndrome   . Ischemic colitis ([place])   . Malignant neoplasm of kidney and other and unspecified urinary organs    renal carcinoma, left nephrectomy in 2008-per patient.  . Other and unspecified hyperlipidemia   . Personal history of venous thrombosis and embolism    PTE after nephrectomy  . Spinal stenosis   . Type II or unspecified type diabetes mellitus without mention of complication, not stated as uncontrolled 02/05/2012   pt states it was in 2007  . Unspecified essential hypertension     Patient Active Problem List   Diagnosis Date Noted  . Nervous 06/05/2015  . Cough variant asthma 12/29/2014  . Upper airway cough syndrome 12/29/2014  . Spinal stenosis in  cervical region 12/13/2014  . Radiculopathy, lumbar region 12/13/2014  . PERONEAL NEUROPATHY 05/03/2008  . Malignant neoplasm of kidney excluding renal pelvis ([place]) 08/06/2007  . Controlled type 2 diabetes mellitus without complication, without long-term current use of insulin ([place]) 08/06/2007  . GERD 08/06/2007  . PULMONARY EMBOLISM, HX OF 08/06/2007  . Hyperlipidemia 09/03/2006  . Essential hypertension 09/03/2006  . IBS 09/03/2006    Past Surgical History:  Procedure Laterality Date  . BREAST BIOPSY    . COLONOSCOPY W/ POLYPECTOMY  2000  . COLONOSCOPY W/ POLYPECTOMY  07/2004   Hyperplastic polyps  . DILATION AND CURETTAGE OF UTERUS  03/2005   Uterine Polyps  . NEPHRECTOMY  2007   right,Dr Dahlstedt  . SEPTOPLASTY    . TONSILLECTOMY      OB History    No data available       Home Medications    Prior to Admission medications   Medication Sig Start Date End Date Taking? Authorizing Provider  albuterol (PROAIR HFA) 108 (90 Base) MCG/ACT inhaler Inhale 1-2 puffs into the lungs every 6 (six) hours as needed for wheezing or shortness of breath. 02/01/16  Yes Gay Filler Copland, MD  Ascorbic Acid (VITAMIN C) POWD Take 1 packet by mouth 2 (two) times daily as needed (cold symptoms).   Yes Historical Provider, MD  aspirin EC 81 MG tablet Take 162 mg by mouth daily.   Yes Historical Provider, MD  Benzalkonium Chloride (  DIABETIC BASICS HEALTHY FOOT EX) Apply 1 application topically daily as needed (foot pain). OTC diabetic foot cream   Yes Historical Provider, MD  benzonatate (TESSALON) 100 MG capsule Take 1 capsule (100 mg total) by mouth 3 (three) times daily as needed. 06/05/16  Yes Debbrah Alar, NP  buPROPion (WELLBUTRIN) 75 MG tablet Take 1 tablet (75 mg total) by mouth 2 (two) times daily. 05/27/16  Yes Gay Filler Copland, MD  busPIRone (BUSPAR) 5 MG tablet Take 0.5-1 tablets (2.5-5 mg total) by mouth daily as needed. 05/27/16  Yes Gay Filler Copland, MD  CALCIUM PO Take 1  tablet by mouth daily.   Yes Historical Provider, MD  Cholecalciferol (VITAMIN D PO) Take 1 tablet by mouth daily.   Yes Historical Provider, MD  CRANBERRY PO Take 2 capsules by mouth 2 (two) times daily.    Yes Historical Provider, MD  ESTRACE VAGINAL 0.1 MG/GM vaginal cream Apply locally every other night at bedtime 01/16/16  Yes Historical Provider, MD  fluticasone (FLONASE) 50 MCG/ACT nasal spray Place 2 sprays into both nostrils daily. 06/06/16  Yes Gay Filler Copland, MD  fluticasone (FLOVENT HFA) 220 MCG/ACT inhaler Inhale 2 puffs into the lungs 2 (two) times daily. 12/07/15  Yes Jessica C Copland, MD  Fluticasone Propionate (FLONASE NA) Place 1-2 sprays into the nose 2 (two) times daily as needed. 05/30/15  Yes Historical Provider, MD  gabapentin (NEURONTIN) 100 MG capsule Take 1 capsule (100 mg total) by mouth 4 (four) times daily. 05/27/16  Yes Gay Filler Copland, MD  methocarbamol (ROBAXIN) 500 MG tablet Take 1 tablet by mouth daily as needed for muscle spasms.  10/18/15  Yes Historical Provider, MD  Multiple Vitamins-Minerals (EYE VITAMINS PO) Take by mouth.   Yes Historical Provider, MD  polyethylene glycol powder (GLYCOLAX/MIRALAX) powder Take 0.5 Containers by mouth daily as needed for mild constipation. Use as needed 11/15/15  Yes Historical Provider, MD  ranitidine (ZANTAC) 150 MG capsule Take 1 capsule (150 mg total) by mouth 2 (two) times daily. 05/27/16  Yes Gay Filler Copland, MD  traMADol (ULTRAM) 50 MG tablet Take 1 tablet (50 mg total) by mouth every 6 (six) hours as needed. 05/27/16  Yes Gay Filler Copland, MD  valsartan (DIOVAN) 80 MG tablet Take 1 tablet (80 mg total) by mouth daily. 08/29/16  Yes Gay Filler Copland, MD  Blood Glucose Monitoring Suppl (TRUERESULT BLOOD GLUCOSE) W/DEVICE KIT Use to test blood sugar ICD 10 E11 9 07/14/14   Hendricks Limes, MD  cephALEXin (KEFLEX) 500 MG capsule Take 1 capsule (500 mg total) by mouth 2 (two) times daily. Patient not taking: Reported on  09/04/2016 08/29/16   Gay Filler Copland, MD  glucose blood (TRUETEST TEST) test strip Use to test blood sugar ICD 10 E11 9 07/14/14   Hendricks Limes, MD  RA P COL-RITE 8.6-50 MG tablet take 1 tablet by mouth once daily Patient not taking: Reported on 09/04/2016 12/08/15   Hoyt Koch, MD  TRUEPLUS LANCETS 28G MISC Use to test blood sugar ICD 10 E11 9 07/14/14   Hendricks Limes, MD    Family History Family History  Problem Relation Age of Onset  . Stroke Mother     onset in 25s  . Diabetes Mother   . Cancer Mother     bladder; nephrectomy for calculi/also uterine , TAH & BSO  . Aneurysm Mother     thoracic  . Depression Mother   . Stroke Brother   . Heart  failure Brother   . Heart attack Other     maternal family history  . Heart failure Maternal Grandfather   . Lung cancer Father     smoked  . Heart failure Sister   . Depression Sister   . Alzheimer's disease Sister   . COPD Sister   . Aneurysm Brother     cns  . Depression Maternal Aunt     X2  . Bipolar disorder Sister   . Alcoholism Neg Hx     Social History Social History  Substance Use Topics  . Smoking status: Never Smoker  . Smokeless tobacco: Never Used  . Alcohol use No     Allergies   Lisinopril; Codeine; and Verapamil   Review of Systems Review of Systems  Constitutional: Positive for appetite change and fatigue. Negative for fever.  Respiratory: Negative for shortness of breath.   Cardiovascular: Positive for chest pain.  Gastrointestinal: Negative for abdominal pain, diarrhea, nausea and vomiting.  Genitourinary: Negative for dysuria.  Musculoskeletal: Negative for back pain.  Neurological: Positive for weakness.  All other systems reviewed and are negative.    Physical Exam Updated Vital Signs BP 158/90 (BP Location: Right Arm)   Pulse 91   Temp 98.1 F (36.7 C) (Oral)   Resp 23   Ht 5' 3"  (1.6 m)   Wt 140 lb (63.5 kg)   SpO2 96%   BMI 24.80 kg/m   Physical Exam    Constitutional: She is oriented to person, place, and time. She appears well-developed and well-nourished.  HENT:  Head: Normocephalic and atraumatic.  Eyes: Right eye exhibits no discharge.  Cardiovascular: Normal rate, regular rhythm and normal heart sounds.   No murmur heard. Pulmonary/Chest: Effort normal and breath sounds normal. She has no wheezes. She has no rales.  Abdominal: Soft. She exhibits no distension. There is no tenderness.  Neurological: She is oriented to person, place, and time.  Skin: Skin is warm and dry. She is not diaphoretic.  Psychiatric: She has a normal mood and affect.  Nursing note and vitals reviewed.    ED Treatments / Results  Labs (all labs ordered are listed, but only abnormal results are displayed) Labs Reviewed  CBC WITH DIFFERENTIAL/PLATELET - Abnormal; Notable for the following:       Result Value   WBC 10.6 (*)    All other components within normal limits  COMPREHENSIVE METABOLIC PANEL - Abnormal; Notable for the following:    Glucose, Bld 127 (*)    BUN 22 (*)    Creatinine, Ser 1.34 (*)    GFR calc non Af Amer 35 (*)    GFR calc Af Amer 41 (*)    All other components within normal limits  URINALYSIS, ROUTINE W REFLEX MICROSCOPIC - Abnormal; Notable for the following:    APPearance HAZY (*)    All other components within normal limits  URINE CULTURE  PROTIME-INR  INFLUENZA PANEL BY PCR (TYPE A & B)  I-STAT CG4 LACTIC ACID, ED  I-STAT TROPOININ, ED  I-STAT CG4 LACTIC ACID, ED  Randolm Idol, ED    EKG  EKG Interpretation  Date/Time:  Wednesday September 04 2016 17:24:38 EST Ventricular Rate:  87 PR Interval:    QRS Duration: 79 QT Interval:  360 QTC Calculation: 433 R Axis:   -45 Text Interpretation:  Sinus rhythm Atrial premature complexes in couplets Probable LVH with secondary repol abnrm Inferior infarct, old No significant change since last tracing Confirmed by Gerald Leitz ([id number]) on  09/04/2016 6:12:27 PM        Radiology Ct Angio Chest Pe W And/or Wo Contrast  Result Date: 09/04/2016 CLINICAL DATA:  Shortness of breath. Pleuritic chest pain on the left. History pulmonary embolism. EXAM: CT ANGIOGRAPHY CHEST WITH CONTRAST TECHNIQUE: Multidetector CT imaging of the chest was performed using the standard protocol during bolus administration of intravenous contrast. Multiplanar CT image reconstructions and MIPs were obtained to evaluate the vascular anatomy. CONTRAST:  100 cc Isovue 370 intravenous COMPARISON:  11/22/2014 FINDINGS: Cardiovascular: Satisfactory opacification of the pulmonary arteries to the segmental level. No evidence of pulmonary embolism. Normal heart size. No pericardial effusion. Atherosclerosis, which accounts for stable thickening of the aortic wall, including along the descending segment. Mediastinum/Nodes: Negative for adenopathy or mass. Lungs/Pleura: There is no edema, consolidation, effusion, or pneumothorax. Mild airway thickening and intermittent segmental airway collapse, chronic based on prior. Upper Abdomen: No acute finding Musculoskeletal: No acute or aggressive finding. Degenerative changes and deltoid lipoma on the right Review of the MIP images confirms the above findings. IMPRESSION: Negative for pulmonary embolism or other acute finding. Electronically Signed   By: Monte Fantasia M.D.   On: 09/04/2016 18:47    Procedures Procedures (including critical care time)  Medications Ordered in ED Medications  iopamidol (ISOVUE-370) 76 % injection (100 mLs  Contrast Given 09/04/16 1800)     Initial Impression / Assessment and Plan / ED Course  I have reviewed the triage vital signs and the nursing notes.  Pertinent labs & imaging results that were available during my care of the patient were reviewed by me and considered in my medical decision making (see chart for details).     Patient is well-appearing 83 year old female presenting with pleuritic chest pain since last  night. Chest pain is not at rest. only with deep breathing. She reports mild shortness of breath. She is short of breath walking sort short distances which is new for her.  I had a high enough pretest probability this could be a pulmonary embolism and we will go from straight for CT angio  In addition patient's fatigue could be due to her partially treated urinary tract infection. Therefore we'll send it both UA and urine culture.  The third cause of her fatigue and third chest pain could be secondary to flu and we will send this all at this time.  10:51 PM CT angios there is no pulmonary embolism. Delta troponin is negative. Flu is negative. Patient ambulated without issue. We'll discharge home and have patient follow up with primary care physician.  Final Clinical Impressions(s) / ED Diagnoses   Final diagnoses:  Chest pain, unspecified type    New Prescriptions New Prescriptions   No medications on file     Rosa Wyly Julio Alm, MD 09/04/16 2251

## 2016-09-04 NOTE — Telephone Encounter (Signed)
Patient called stating that she is feeling really bad, weakness, fatigue, low energy, even watching tv doesn't interest her. She states she feels awful. Patient is requesting vitamin B12, she states she has an appointment on Monday but doesn't know if she can wait that long. I transferred the patient to team health to be triaged on how soon she should be seen. Patient hung up before team health answered (on hold with team health for 18mins) team health stated they would call the patient on the numbers provided (home and mobile)

## 2016-09-04 NOTE — ED Triage Notes (Signed)
Patient began having intermittent chest pain that began yesterday at 5 pm.  Patient called her PCP today who advised her to come to the ED.  Her chest pain is on the left chest and is worse when she takes a deep breath. 324 ASA given by EMS.

## 2016-09-04 NOTE — ED Notes (Signed)
Ambulated 50 feet.  SaO2 - 97 %,  Pulse - 99.  Patient tolerated procedure well

## 2016-09-04 NOTE — Discharge Instructions (Signed)
We are unsure what caused her chest pain today. However you're CAT scan was normal and your labs have all been normal. Your EKG is reassuring. Please return to follow-up with her primary care physician. Please return with any concerns.

## 2016-09-05 LAB — URINE CULTURE: CULTURE: NO GROWTH

## 2016-09-05 NOTE — Telephone Encounter (Signed)
Pt seen at ER yesterday

## 2016-09-06 NOTE — Telephone Encounter (Signed)
Pt went to the ER.

## 2016-09-12 ENCOUNTER — Ambulatory Visit: Payer: Medicare HMO | Admitting: Family Medicine

## 2016-09-16 ENCOUNTER — Ambulatory Visit (INDEPENDENT_AMBULATORY_CARE_PROVIDER_SITE_OTHER): Payer: Medicare HMO | Admitting: Family Medicine

## 2016-09-16 VITALS — BP 128/80 | HR 81 | Temp 98.0°F | Ht 63.0 in | Wt 144.2 lb

## 2016-09-16 DIAGNOSIS — N3000 Acute cystitis without hematuria: Secondary | ICD-10-CM | POA: Diagnosis not present

## 2016-09-16 DIAGNOSIS — Y92009 Unspecified place in unspecified non-institutional (private) residence as the place of occurrence of the external cause: Secondary | ICD-10-CM

## 2016-09-16 DIAGNOSIS — I10 Essential (primary) hypertension: Secondary | ICD-10-CM | POA: Diagnosis not present

## 2016-09-16 DIAGNOSIS — W19XXXA Unspecified fall, initial encounter: Secondary | ICD-10-CM

## 2016-09-16 DIAGNOSIS — Y92099 Unspecified place in other non-institutional residence as the place of occurrence of the external cause: Secondary | ICD-10-CM | POA: Diagnosis not present

## 2016-09-16 NOTE — Patient Instructions (Signed)
Please come and see me in 3 months to check on how you are doing- let me know if any problems in the meantime

## 2016-09-16 NOTE — Progress Notes (Signed)
Pre visit review using our clinic review tool, if applicable. No additional management support is needed unless otherwise documented below in the visit note. 

## 2016-09-16 NOTE — Progress Notes (Signed)
[facility] [address] [place], [place] [address] [phone]  Date:  09/16/2016   Name:  Alexa [name]   DOB:  07-26-1930   MRN:  [id number]  PCP:  Lamar Blinks, MD    Chief Complaint: Follow-up (Pt here for 2 week HTN f/u. Pt tolerating valsartan well. Pt did not stop neurotine and is currently taking one tablet three times daily. )   History of Present Illness:  Alexa [name] is a 83 y.o. very pleasant female patient who presents with the following:  Last seen here on 2/1- part of assessment/ plan from that visit:  Here today with a few concerns.  She has been under a tremendous amount of stress with the recent death of her daughter.  She has had 2 unexplained falls.  Her urine does suggest a UTI- will start on keflex while culture is pending.   She also has been altering her BP regimen due to concern about high blood pressure. Her BP does drop with orthostatic testing but is still elevated overall.  Will increase her valsartan to 80 mg and she will stop taking extra pills or using her husband's "heart medicine."  Will obtain labs to look for any other potential problems today and she will follow-up with me in 2 weeks Will taper off neurontin per her request  We did treat her with fosfomycin for a multidrug resistant UTI at our last visit- she feels that her urinary sx are better and that she is overall feeling a lot better since this was treated No further falls since her last visit We increased her valsartan to 80 mg- she is not quite sure of her home readings but thinks that they are looking ok.   She is down to 1 neurontin a day- she is tolerating this decrease ok and will continue this dose for now She does notice some concerns about her memory since her daughter died- she is not sure if this is stress related or if she is starting to develop some memory problems. Offered to have her see neurology for evaluation  but she would like to delay for now, will see if she improves as she adjusts to lowing her daughter.    BP Readings from Last 3 Encounters:  09/16/16 (!) 162/91  09/04/16 158/90  08/29/16 (!) 160/90   Overall she feels more like herself, balance and concentration are better, no CP or SOB  Patient Active Problem List   Diagnosis Date Noted  . Nervous 06/05/2015  . Cough variant asthma 12/29/2014  . Upper airway cough syndrome 12/29/2014  . Spinal stenosis in cervical region 12/13/2014  . Radiculopathy, lumbar region 12/13/2014  . PERONEAL NEUROPATHY 05/03/2008  . Malignant neoplasm of kidney excluding renal pelvis ([place]) 08/06/2007  . Controlled type 2 diabetes mellitus without complication, without long-term current use of insulin ([place]) 08/06/2007  . GERD 08/06/2007  . PULMONARY EMBOLISM, HX OF 08/06/2007  . Hyperlipidemia 09/03/2006  . Essential hypertension 09/03/2006  . IBS 09/03/2006    Past Medical History:  Diagnosis Date  . Asthma   . Chronic obstructive pulmonary disease ([place])   . Diverticulosis   . Diverticulosis of colon (without mention of hemorrhage)   . Esophageal reflux   . History of colonic polyps 06/05/05   hyperplastic  . Internal hemorrhoids   . Irritable bowel syndrome   . Ischemic colitis ([place])   . Malignant neoplasm of kidney and other  and unspecified urinary organs    renal carcinoma, left nephrectomy in 2008-per patient.  . Other and unspecified hyperlipidemia   . Personal history of venous thrombosis and embolism    PTE after nephrectomy  . Spinal stenosis   . Type II or unspecified type diabetes mellitus without mention of complication, not stated as uncontrolled 02/05/2012   pt states it was in 2007  . Unspecified essential hypertension     Past Surgical History:  Procedure Laterality Date  . BREAST BIOPSY    . COLONOSCOPY W/ POLYPECTOMY  2000  . COLONOSCOPY W/ POLYPECTOMY  07/2004   Hyperplastic polyps  . DILATION AND CURETTAGE OF  UTERUS  03/2005   Uterine Polyps  . NEPHRECTOMY  2007   right,Dr Dahlstedt  . SEPTOPLASTY    . TONSILLECTOMY      Social History  Substance Use Topics  . Smoking status: Never Smoker  . Smokeless tobacco: Never Used  . Alcohol use No    Family History  Problem Relation Age of Onset  . Stroke Mother     onset in 2s  . Diabetes Mother   . Cancer Mother     bladder; nephrectomy for calculi/also uterine , TAH & BSO  . Aneurysm Mother     thoracic  . Depression Mother   . Stroke Brother   . Heart failure Brother   . Heart attack Other     maternal family history  . Heart failure Maternal Grandfather   . Lung cancer Father     smoked  . Heart failure Sister   . Depression Sister   . Alzheimer's disease Sister   . COPD Sister   . Aneurysm Brother     cns  . Depression Maternal Aunt     X2  . Bipolar disorder Sister   . Alcoholism Neg Hx     Allergies  Allergen Reactions  . Lisinopril     Cough D/Ced by Dr Melvyn Novas  . Codeine     nausea  . Verapamil     Pain in feet    Medication list has been reviewed and updated.  Current Outpatient Prescriptions on File Prior to Visit  Medication Sig Dispense Refill  . albuterol (PROAIR HFA) 108 (90 Base) MCG/ACT inhaler Inhale 1-2 puffs into the lungs every 6 (six) hours as needed for wheezing or shortness of breath. 8.5 Inhaler 5  . Ascorbic Acid (VITAMIN C) POWD Take 1 packet by mouth 2 (two) times daily as needed (cold symptoms).    Marland Kitchen aspirin EC 81 MG tablet Take 162 mg by mouth daily.    . Benzalkonium Chloride (DIABETIC BASICS HEALTHY FOOT EX) Apply 1 application topically daily as needed (foot pain). OTC diabetic foot cream    . benzonatate (TESSALON) 100 MG capsule Take 1 capsule (100 mg total) by mouth 3 (three) times daily as needed. 20 capsule 0  . Blood Glucose Monitoring Suppl (TRUERESULT BLOOD GLUCOSE) W/DEVICE KIT Use to test blood sugar ICD 10 E11 9 1 each 0  . buPROPion (WELLBUTRIN) 75 MG tablet Take 1 tablet  (75 mg total) by mouth 2 (two) times daily. 180 tablet 3  . busPIRone (BUSPAR) 5 MG tablet Take 0.5-1 tablets (2.5-5 mg total) by mouth daily as needed. 30 tablet 3  . CALCIUM PO Take 1 tablet by mouth daily.    . cephALEXin (KEFLEX) 500 MG capsule Take 1 capsule (500 mg total) by mouth 2 (two) times daily. (Patient not taking: Reported on 09/04/2016) 14 capsule  0  . Cholecalciferol (VITAMIN D PO) Take 1 tablet by mouth daily.    Marland Kitchen CRANBERRY PO Take 2 capsules by mouth 2 (two) times daily.     Marland Kitchen ESTRACE VAGINAL 0.1 MG/GM vaginal cream Apply locally every other night at bedtime  1  . fluticasone (FLONASE) 50 MCG/ACT nasal spray Place 2 sprays into both nostrils daily. 16 g 6  . fluticasone (FLOVENT HFA) 220 MCG/ACT inhaler Inhale 2 puffs into the lungs 2 (two) times daily. 1 Inhaler 12  . Fluticasone Propionate (FLONASE NA) Place 1-2 sprays into the nose 2 (two) times daily as needed.    . gabapentin (NEURONTIN) 100 MG capsule Take 1 capsule (100 mg total) by mouth 4 (four) times daily. 120 capsule 4  . glucose blood (TRUETEST TEST) test strip Use to test blood sugar ICD 10 E11 9 100 each 3  . methocarbamol (ROBAXIN) 500 MG tablet Take 1 tablet by mouth daily as needed for muscle spasms.     . Multiple Vitamins-Minerals (EYE VITAMINS PO) Take by mouth.    . polyethylene glycol powder (GLYCOLAX/MIRALAX) powder Take 0.5 Containers by mouth daily as needed for mild constipation. Use as needed    . RA P COL-RITE 8.6-50 MG tablet take 1 tablet by mouth once daily (Patient not taking: Reported on 09/04/2016) 30 tablet 3  . ranitidine (ZANTAC) 150 MG capsule Take 1 capsule (150 mg total) by mouth 2 (two) times daily. 180 capsule 3  . traMADol (ULTRAM) 50 MG tablet Take 1 tablet (50 mg total) by mouth every 6 (six) hours as needed. 120 tablet 2  . TRUEPLUS LANCETS 28G MISC Use to test blood sugar ICD 10 E11 9 100 each 3  . valsartan (DIOVAN) 80 MG tablet Take 1 tablet (80 mg total) by mouth daily. 30 tablet 6   . [DISCONTINUED] DULoxetine (CYMBALTA) 30 MG capsule Take 1 capsule (30 mg total) by mouth daily. 30 capsule 0   No current facility-administered medications on file prior to visit.     Review of Systems: No dysuria, hematuria, abd pain, fever or chills  As per HPI- otherwise negative.   Physical Examination: Vitals:   09/16/16 1107  BP: (!) 162/91  Pulse: 81  Temp: 98 F (36.7 C)   Vitals:   09/16/16 1107  Weight: 144 lb 3.2 oz (65.4 kg)  Height: _0  (1.6 m)   Body mass index is 25.54 kg/m. Ideal Body Weight: Weight in (lb) to have BMI = 25: 140.8  GEN: WDWN, NAD, Non-toxic, A & O x 3, mild overweight, looks well HEENT: Atraumatic, Normocephalic. Neck supple. No masses, No LAD. Ears and Nose: No external deformity. CV: RRR, No M/G/R. No JVD. No thrill. No extra heart sounds. PULM: CTA B, no wheezes, crackles, rhonchi. No retractions. No resp. distress. No accessory muscle use. EXTR: No c/c/e NEURO Normal gait.  PSYCH: Normally interactive. Conversant. Not depressed or anxious appearing.  Calm demeanor.    Assessment and Plan: Essential hypertension  Fall in home, initial encounter  Acute cystitis without hematuria  Here today to follow-up from a recent visit where she had noted falls and not feeling like herself, as well as HTN- repeat BP 128/80 At that time she was noted to have a MDR UTI- this was treated and she is feeling much better We also increased her valsartan dose and her BP is much improved today She will continue current meds and see me in 3 months- at that time will re-assess her memory  and see if she would like neurology eval   Signed Lamar Blinks, MD

## 2016-11-06 DIAGNOSIS — Z85528 Personal history of other malignant neoplasm of kidney: Secondary | ICD-10-CM | POA: Diagnosis not present

## 2016-11-06 DIAGNOSIS — R8271 Bacteriuria: Secondary | ICD-10-CM | POA: Diagnosis not present

## 2016-11-19 ENCOUNTER — Other Ambulatory Visit: Payer: Self-pay | Admitting: Family Medicine

## 2016-11-19 DIAGNOSIS — M4802 Spinal stenosis, cervical region: Secondary | ICD-10-CM

## 2016-12-12 ENCOUNTER — Other Ambulatory Visit: Payer: Self-pay | Admitting: Family Medicine

## 2016-12-12 DIAGNOSIS — M4802 Spinal stenosis, cervical region: Secondary | ICD-10-CM

## 2016-12-12 NOTE — Telephone Encounter (Signed)
Requesting:traMADol (ULTRAM) 50 MG tablet Contract UDS Last OV: 09/16/16 Last Refill: 05/27/16  Please Advise

## 2016-12-12 NOTE — Telephone Encounter (Signed)
[facility]: last fill of tramadol on 09/26/16, #120 Will refill for her- uses for cervical steonsis   Meds ordered this encounter  Medications  . traMADol (ULTRAM) 50 MG tablet    Sig: take 1 tablet by mouth every 6 hours if needed    Dispense:  120 tablet    Refill:  2

## 2016-12-25 ENCOUNTER — Other Ambulatory Visit: Payer: Self-pay

## 2016-12-26 ENCOUNTER — Ambulatory Visit (INDEPENDENT_AMBULATORY_CARE_PROVIDER_SITE_OTHER): Payer: Medicare HMO | Admitting: Internal Medicine

## 2016-12-26 ENCOUNTER — Encounter: Payer: Self-pay | Admitting: Internal Medicine

## 2016-12-26 VITALS — BP 128/78 | HR 95 | Temp 97.7°F | Resp 16 | Ht 63.0 in | Wt 139.0 lb

## 2016-12-26 DIAGNOSIS — J441 Chronic obstructive pulmonary disease with (acute) exacerbation: Secondary | ICD-10-CM | POA: Diagnosis not present

## 2016-12-26 MED ORDER — AZITHROMYCIN 250 MG PO TABS: ORAL_TABLET | ORAL | 0 refills | Status: DC

## 2016-12-26 MED ORDER — PREDNISONE 10 MG PO TABS: ORAL_TABLET | ORAL | 0 refills | Status: DC

## 2016-12-26 NOTE — Patient Instructions (Signed)
Rest, fluids , tylenol  For cough:  Take Mucinex DM twice a day as needed until better  Continue Flovent as you are doing Use albuterol every 6 hours for cough, wheezing or chest congestion  For nasal congestion: Use OTC Nasocort or Flonase : 2 nasal sprays on each side of the nose in the morning until you feel better   Take the antibiotic as prescribed  , Zithromax  Take prednisone as prescribed  Call if not gradually better over the next  10 days  Call anytime if the symptoms are severe

## 2016-12-26 NOTE — Progress Notes (Signed)
Pre visit review using our clinic review tool, if applicable. No additional management support is needed unless otherwise documented below in the visit note. 

## 2016-12-26 NOTE — Progress Notes (Signed)
Subjective:    Patient ID: Alexa [name], female    DOB: [birth year], 83 y.o.   MRN: [id number]  DOS:  12/26/2016 Type of visit - description : Acute Interval history:  On and off cough for 2 months, she thinks related to allergies. Symptoms worse for the last few days, w/ + gray sputum. Uses Flovent regularly and has been using albuterol once or twice every day.  Review of Systems No fever chills + Sinus congestion and green nasal discharge. No chest pain, no hemoptysis  Past Medical History:  Diagnosis Date  . Asthma   . Chronic obstructive pulmonary disease ([employer])   . Diverticulosis   . Diverticulosis of colon (without mention of hemorrhage)   . Esophageal reflux   . History of colonic polyps 06/05/05   hyperplastic  . Internal hemorrhoids   . Irritable bowel syndrome   . Ischemic colitis ([place])   . Malignant neoplasm of kidney and other and unspecified urinary organs    renal carcinoma, left nephrectomy in 2008-per patient.  . Other and unspecified hyperlipidemia   . Personal history of venous thrombosis and embolism    PTE after nephrectomy  . Spinal stenosis   . Type II or unspecified type diabetes mellitus without mention of complication, not stated as uncontrolled 02/05/2012   pt states it was in 2007  . Unspecified essential hypertension     Past Surgical History:  Procedure Laterality Date  . BREAST BIOPSY    . COLONOSCOPY W/ POLYPECTOMY  2000  . COLONOSCOPY W/ POLYPECTOMY  07/2004   Hyperplastic polyps  . DILATION AND CURETTAGE OF UTERUS  03/2005   Uterine Polyps  . NEPHRECTOMY  2007   right,Dr Dahlstedt  . SEPTOPLASTY    . TONSILLECTOMY      Social History   Social History  . Marital status: Married    Spouse name: N/A  . Number of children: N/A  . Years of education: N/A   Occupational History  . Not on file.   Social History Main Topics  . Smoking status: Never Smoker  . Smokeless tobacco: Never Used  . Alcohol use No  . Drug use: No  .  Sexual activity: Not on file   Other Topics Concern  . Not on file   Social History Narrative  . No narrative on file      Allergies as of 12/26/2016      Reactions   Lisinopril    Cough D/Ced by Dr Melvyn Novas   Codeine    nausea   Verapamil    Pain in feet      Medication List       Accurate as of 12/26/16 11:59 PM. Always use your most recent med list.          albuterol 108 (90 Base) MCG/ACT inhaler Commonly known as:  PROAIR HFA Inhale 1-2 puffs into the lungs every 6 (six) hours as needed for wheezing or shortness of breath.   aspirin EC 81 MG tablet Take 162 mg by mouth daily.   azithromycin 250 MG tablet Commonly known as:  ZITHROMAX Z-PAK 2 tabs a day the first day, then 1 tab a day x 4 days   buPROPion 75 MG tablet Commonly known as:  WELLBUTRIN Take 1 tablet (75 mg total) by mouth 2 (two) times daily.   busPIRone 5 MG tablet Commonly known as:  BUSPAR Take 0.5-1 tablets (2.5-5 mg total) by mouth daily as needed.   CALCIUM PO Take 1 tablet  by mouth daily.   CRANBERRY PO Take 2 capsules by mouth 2 (two) times daily.   DIABETIC BASICS HEALTHY FOOT EX Apply 1 application topically daily as needed (foot pain). OTC diabetic foot cream   ESTRACE VAGINAL 0.1 MG/GM vaginal cream Generic drug:  estradiol Apply locally every other night at bedtime   EYE VITAMINS PO Take by mouth.   fluticasone 220 MCG/ACT inhaler Commonly known as:  FLOVENT HFA Inhale 2 puffs into the lungs 2 (two) times daily.   fluticasone 50 MCG/ACT nasal spray Commonly known as:  FLONASE Place 2 sprays into both nostrils daily.   gabapentin 100 MG capsule Commonly known as:  NEURONTIN take 1 capsule by mouth four times a day   glucose blood test strip Commonly known as:  TRUETEST TEST Use to test blood sugar ICD 10 E11 9   methocarbamol 500 MG tablet Commonly known as:  ROBAXIN Take 1 tablet by mouth daily as needed for muscle spasms.   polyethylene glycol powder  powder Commonly known as:  GLYCOLAX/MIRALAX Take 0.5 Containers by mouth daily as needed for mild constipation. Use as needed   predniSONE 10 MG tablet Commonly known as:  DELTASONE 3 tabs x 3 days, 2 tabs x 3 days, 1 tab x 3 days   RA P COL-RITE 8.6-50 MG tablet Generic drug:  senna-docusate take 1 tablet by mouth once daily   ranitidine 150 MG capsule Commonly known as:  ZANTAC Take 1 capsule (150 mg total) by mouth 2 (two) times daily.   traMADol 50 MG tablet Commonly known as:  ULTRAM take 1 tablet by mouth every 6 hours if needed   TRUEPLUS LANCETS 28G Misc Use to test blood sugar ICD 10 E11 9   TRUERESULT BLOOD GLUCOSE w/Device Kit Use to test blood sugar ICD 10 E11 9   valsartan 80 MG tablet Commonly known as:  DIOVAN Take 1 tablet (80 mg total) by mouth daily.   Vitamin C Powd Take 1 packet by mouth 2 (two) times daily as needed (cold symptoms).   VITAMIN D PO Take 1 tablet by mouth daily.          Objective:   Physical Exam BP 128/78 (BP Location: Left Arm, Patient Position: Sitting, Cuff Size: Small)   Pulse 95   Temp 97.7 F (36.5 C) (Oral)   Resp 16   Ht _0  (1.6 m)   Wt 139 lb (63 kg)   SpO2 94%   BMI 24.62 kg/m  General:   Well developed, well nourished . NAD.  HEENT:  Normocephalic . Face symmetric, atraumatic. TMs normal, throat symmetric and not red. Nose is slightly congested  Lungs:  + Rhonchi, mild to moderate bilaterally, increased expiratory time. No crackles. Normal respiratory effort, no intercostal retractions, no accessory muscle use. Heart: RRR,  no murmur.  No pretibial edema bilaterally  Skin: Not pale. Not jaundice Neurologic:  alert & oriented X3.  Speech normal, gait appropriate for age and unassisted Psych--  Cognition and judgment appear intact.  Cooperative with normal attention span and concentration.  Behavior appropriate. No anxious or depressed appearing.      Assessment & Plan:   83 year old patient  with history of DM, HTN, COPD, depression, asthma, GERD, ischemic colitis, renal carcinoma, hyperlipidemia, history of PE,  on HRT presents with the following  COPD exacerbation, no evidence of pneumonia on clinical grounds. Will treat with Mucinex, a more frequent use of albuterol temporarily , Zithromax, prednisone, watch CBGs, call if >  200. See instructions

## 2017-02-07 DIAGNOSIS — H04123 Dry eye syndrome of bilateral lacrimal glands: Secondary | ICD-10-CM | POA: Diagnosis not present

## 2017-02-07 DIAGNOSIS — H5213 Myopia, bilateral: Secondary | ICD-10-CM | POA: Diagnosis not present

## 2017-02-07 DIAGNOSIS — H34232 Retinal artery branch occlusion, left eye: Secondary | ICD-10-CM | POA: Diagnosis not present

## 2017-02-07 DIAGNOSIS — H524 Presbyopia: Secondary | ICD-10-CM | POA: Diagnosis not present

## 2017-02-07 DIAGNOSIS — H52223 Regular astigmatism, bilateral: Secondary | ICD-10-CM | POA: Diagnosis not present

## 2017-02-07 DIAGNOSIS — H353131 Nonexudative age-related macular degeneration, bilateral, early dry stage: Secondary | ICD-10-CM | POA: Diagnosis not present

## 2017-02-21 ENCOUNTER — Telehealth: Payer: Self-pay | Admitting: Family Medicine

## 2017-02-21 NOTE — Telephone Encounter (Signed)
[id number], [place] [address] [phone] (Phone) [phone] (Fax)   Pharmacy called to inform PCP valsartan (DIOVAN) 80 MG tablet recall, requesting alternate, please advise

## 2017-02-23 NOTE — Telephone Encounter (Signed)
Switch to Losartan 25 mg tabs, 1 tab po daily disp #30 and come in for BP check in 2-3 weeks

## 2017-02-24 MED ORDER — LOSARTAN POTASSIUM 25 MG PO TABS: 25.0000 mg | ORAL_TABLET | Freq: Every day | ORAL | 0 refills | Status: DC

## 2017-02-24 NOTE — Telephone Encounter (Signed)
Noted.  New Rx sent to pharmacy.  Pt is aware. She has an appt with Dr. Larose Kells tomorrow (02/25/17) as well.

## 2017-02-24 NOTE — Addendum Note (Signed)
Addended by: Rudene Anda on: 02/24/2017 03:09 PM   Modules accepted: Orders

## 2017-02-24 NOTE — Telephone Encounter (Signed)
Complete

## 2017-02-25 ENCOUNTER — Ambulatory Visit (INDEPENDENT_AMBULATORY_CARE_PROVIDER_SITE_OTHER): Payer: Medicare HMO | Admitting: Internal Medicine

## 2017-02-25 ENCOUNTER — Telehealth: Payer: Self-pay | Admitting: Family Medicine

## 2017-02-25 ENCOUNTER — Encounter: Payer: Self-pay | Admitting: Internal Medicine

## 2017-02-25 VITALS — BP 144/70 | HR 68 | Temp 97.5°F | Resp 14 | Ht 63.0 in | Wt 142.0 lb

## 2017-02-25 DIAGNOSIS — R739 Hyperglycemia, unspecified: Secondary | ICD-10-CM

## 2017-02-25 DIAGNOSIS — I10 Essential (primary) hypertension: Secondary | ICD-10-CM | POA: Diagnosis not present

## 2017-02-25 DIAGNOSIS — R0609 Other forms of dyspnea: Secondary | ICD-10-CM | POA: Diagnosis not present

## 2017-02-25 DIAGNOSIS — R5383 Other fatigue: Secondary | ICD-10-CM

## 2017-02-25 MED ORDER — BUDESONIDE-FORMOTEROL FUMARATE 80-4.5 MCG/ACT IN AERO: 2.0000 | INHALATION_SPRAY | Freq: Two times a day (BID) | RESPIRATORY_TRACT | 3 refills | Status: DC

## 2017-02-25 NOTE — Progress Notes (Signed)
Pre visit review using our clinic review tool, if applicable. No additional management support is needed unless otherwise documented below in the visit note. 

## 2017-02-25 NOTE — Telephone Encounter (Signed)
A user error has taken place.

## 2017-02-25 NOTE — Progress Notes (Signed)
Subjective:    Patient ID: Alexa [name], female    DOB: 1929-09-10, 83 y.o.   MRN: [id number]  DOS:  02/25/2017 Type of visit - description : acute Interval history: Her main concern is feeling weak. When asked exactly what she means, she reports 1 year history of decreased energy, DOE and simply fatigue. Symptoms somewhat worse after valsartan dose increased. She carries a diagnosis of COPD and reports she is wheezing with activity and sometimes when she lays down.   Review of Systems Denies fever chills No weight loss Lost her 48 year old daughter few months ago, currently on bupropion and BuSpar when necessary, she believes she is handling things okay. Denies chest pain, palpitations. No lower extremity edema Minimal cough if any. Some headaches with no myalgias.   Past Medical History:  Diagnosis Date  . Asthma   . Chronic obstructive pulmonary disease ([place])   . Diverticulosis   . Diverticulosis of colon (without mention of hemorrhage)   . Esophageal reflux   . History of colonic polyps 06/05/05   hyperplastic  . Internal hemorrhoids   . Irritable bowel syndrome   . Ischemic colitis ([place])   . Malignant neoplasm of kidney and other and unspecified urinary organs    renal carcinoma, left nephrectomy in 2008-per patient.  . Other and unspecified hyperlipidemia   . Personal history of venous thrombosis and embolism    PTE after nephrectomy  . Spinal stenosis   . Type II or unspecified type diabetes mellitus without mention of complication, not stated as uncontrolled 02/05/2012   pt states it was in 2007  . Unspecified essential hypertension     Past Surgical History:  Procedure Laterality Date  . BREAST BIOPSY    . COLONOSCOPY W/ POLYPECTOMY  2000  . COLONOSCOPY W/ POLYPECTOMY  07/2004   Hyperplastic polyps  . DILATION AND CURETTAGE OF UTERUS  03/2005   Uterine Polyps  . NEPHRECTOMY  2007   right,Dr Dahlstedt  . SEPTOPLASTY    . TONSILLECTOMY      Social  History   Social History  . Marital status: Married    Spouse name: N/A  . Number of children: N/A  . Years of education: N/A   Occupational History  . Not on file.   Social History Main Topics  . Smoking status: Never Smoker  . Smokeless tobacco: Never Used  . Alcohol use No  . Drug use: No  . Sexual activity: Not on file   Other Topics Concern  . Not on file   Social History Narrative  . No narrative on file      Allergies as of 02/25/2017      Reactions   Lisinopril    Cough D/Ced by Dr Melvyn Novas   Codeine    nausea   Verapamil    Pain in feet      Medication List       Accurate as of 02/25/17 11:59 PM. Always use your most recent med list.          albuterol 108 (90 Base) MCG/ACT inhaler Commonly known as:  PROAIR HFA Inhale 1-2 puffs into the lungs every 6 (six) hours as needed for wheezing or shortness of breath.   aspirin EC 81 MG tablet Take 162 mg by mouth daily.   budesonide-formoterol 80-4.5 MCG/ACT inhaler Commonly known as:  SYMBICORT Inhale 2 puffs into the lungs 2 (two) times daily.   buPROPion 75 MG tablet Commonly known as:  WELLBUTRIN Take 1 tablet (  75 mg total) by mouth 2 (two) times daily.   busPIRone 5 MG tablet Commonly known as:  BUSPAR Take 0.5-1 tablets (2.5-5 mg total) by mouth daily as needed.   CALCIUM PO Take 1 tablet by mouth daily.   CRANBERRY PO Take 2 capsules by mouth 2 (two) times daily.   DIABETIC BASICS HEALTHY FOOT EX Apply 1 application topically daily as needed (foot pain). OTC diabetic foot cream   ESTRACE VAGINAL 0.1 MG/GM vaginal cream Generic drug:  estradiol Apply locally every other night at bedtime   EYE VITAMINS PO Take by mouth.   fluticasone 220 MCG/ACT inhaler Commonly known as:  FLOVENT HFA Inhale 2 puffs into the lungs 2 (two) times daily.   fluticasone 50 MCG/ACT nasal spray Commonly known as:  FLONASE Place 2 sprays into both nostrils daily.   gabapentin 100 MG capsule Commonly  known as:  NEURONTIN take 1 capsule by mouth four times a day   glucose blood test strip Commonly known as:  TRUETEST TEST Use to test blood sugar ICD 10 E11 9   losartan 25 MG tablet Commonly known as:  COZAAR Take 1 tablet (25 mg total) by mouth daily.   methocarbamol 500 MG tablet Commonly known as:  ROBAXIN Take 1 tablet by mouth daily as needed for muscle spasms.   polyethylene glycol powder powder Commonly known as:  GLYCOLAX/MIRALAX Take 0.5 Containers by mouth daily as needed for mild constipation. Use as needed   RA P COL-RITE 8.6-50 MG tablet Generic drug:  senna-docusate take 1 tablet by mouth once daily   ranitidine 150 MG capsule Commonly known as:  ZANTAC Take 1 capsule (150 mg total) by mouth 2 (two) times daily.   traMADol 50 MG tablet Commonly known as:  ULTRAM take 1 tablet by mouth every 6 hours if needed   TRUEPLUS LANCETS 28G Misc Use to test blood sugar ICD 10 E11 9   TRUERESULT BLOOD GLUCOSE w/Device Kit Use to test blood sugar ICD 10 E11 9   Vitamin C Powd Take 1 packet by mouth 2 (two) times daily as needed (cold symptoms).   VITAMIN D PO Take 1 tablet by mouth daily.          Objective:   Physical Exam BP (!) 144/70 (BP Location: Right Arm, Patient Position: Sitting, Cuff Size: Small)   Pulse 68   Temp (!) 97.5 F (36.4 C) (Oral)   Resp 14   Ht _0  (1.6 m)   Wt 142 lb (64.4 kg)   SpO2 97%   BMI 25.15 kg/m  General:   Well developed, well nourished. NAD.  HEENT:  Normocephalic . Face symmetric, atraumatic Neck: No JVD at 45 Lungs:  CTA B Normal respiratory effort, no intercostal retractions, no accessory muscle use. Heart: Tachycardic,  no murmur.  no pretibial edema bilaterally  Abdomen:  Not distended, soft, non-tender. No rebound or rigidity. No organomegaly or shifting dullness Skin: Not pale. Not jaundice Neurologic:  alert & oriented X3.  Speech normal, gait appropriate for age and unassisted Psych--    Cognition and judgment appear intact.  Cooperative with normal attention span and concentration.  Behavior appropriate. No anxious or depressed appearing.    Assessment & Plan:    83 year old patient with history of DM, HTN, COPD, depression, asthma, GERD, ischemic colitis, renal carcinoma, hyperlipidemia, history of PE,  on HRT presents with the following  Fatigue, DOE: going on for a year, depression seems to be well-controlled despite the loss of  her daughter few months ago. Has the diagnosis of COPD, reports occ cough , + wheezing with exertion. No PFTs found in the chart. Had a CT chest d/t SOB 08-2016: No pulmonary emboli, no mention of COPD changes EKG today: Sinus tachycardia, d/w cards , no acute changes  Recent labs reviewed Plan: -for completeness we will repeat some of them: CMP, CBC, R8S, TSH, X28, folic acid, sedimentation rate. -Echocardiogram -PFTs and refer to pulmonary for dyspnea on exertion . COPD?. -Change Flovent to Symbicort HTN: will switch from valsartan to losartan today. Monitor BPs. Follow-up with PCP 2 weeks. ER if symptoms severe

## 2017-02-25 NOTE — Patient Instructions (Signed)
GO TO THE LAB : Get the blood work     GO TO THE FRONT DESK Schedule your next appointment for a  checkup with your primary doctor in 2 weeks from today.  Start  losartan.  Check the  blood pressure 2 or 3 times a  week   Be sure your blood pressure is between 110/65 and  145/85. If it is consistently higher or lower, let me know  If you have severe symptoms go to the ER.

## 2017-02-26 ENCOUNTER — Other Ambulatory Visit: Payer: Self-pay

## 2017-02-26 ENCOUNTER — Ambulatory Visit (HOSPITAL_COMMUNITY): Payer: Medicare HMO | Attending: Cardiology

## 2017-02-26 DIAGNOSIS — I420 Dilated cardiomyopathy: Secondary | ICD-10-CM | POA: Diagnosis not present

## 2017-02-26 DIAGNOSIS — R5383 Other fatigue: Secondary | ICD-10-CM

## 2017-02-26 DIAGNOSIS — I517 Cardiomegaly: Secondary | ICD-10-CM | POA: Insufficient documentation

## 2017-02-26 DIAGNOSIS — R0609 Other forms of dyspnea: Secondary | ICD-10-CM | POA: Diagnosis not present

## 2017-02-26 LAB — COMPREHENSIVE METABOLIC PANEL
ALT: 24 U/L (ref 0–35)
AST: 25 U/L (ref 0–37)
Albumin: 3.9 g/dL (ref 3.5–5.2)
Alkaline Phosphatase: 49 U/L (ref 39–117)
BUN: 20 mg/dL (ref 6–23)
CHLORIDE: 105 meq/L (ref 96–112)
CO2: 28 mEq/L (ref 19–32)
CREATININE: 1.03 mg/dL (ref 0.40–1.20)
Calcium: 9.4 mg/dL (ref 8.4–10.5)
GFR: 54.09 mL/min — ABNORMAL LOW (ref >60.00)
GLUCOSE: 112 mg/dL — AB (ref 70–99)
POTASSIUM: 4.8 meq/L (ref 3.5–5.1)
SODIUM: 140 meq/L (ref 135–145)
TOTAL PROTEIN: 7 g/dL (ref 6.0–8.3)
Total Bilirubin: 0.7 mg/dL (ref 0.2–1.2)

## 2017-02-26 LAB — TSH: TSH: 1.73 u[IU]/mL (ref 0.35–4.50)

## 2017-02-26 LAB — CBC WITH DIFFERENTIAL/PLATELET
BASOS PCT: 1 % (ref 0.0–3.0)
Basophils Absolute: 0.1 10*3/uL (ref 0.0–0.1)
EOS ABS: 0.5 10*3/uL (ref 0.0–0.7)
EOS PCT: 6.3 % — AB (ref 0.0–5.0)
HCT: 44.7 % (ref 36.0–46.0)
Hemoglobin: 14.8 g/dL (ref 12.0–15.0)
LYMPHS ABS: 2.5 10*3/uL (ref 0.7–4.0)
Lymphocytes Relative: 29 % (ref 12.0–46.0)
MCHC: 33.1 g/dL (ref 30.0–36.0)
MCV: 90.6 fl (ref 78.0–100.0)
MONO ABS: 0.7 10*3/uL (ref 0.1–1.0)
Monocytes Relative: 8.3 % (ref 3.0–12.0)
NEUTROS ABS: 4.7 10*3/uL (ref 1.4–7.7)
NEUTROS PCT: 55.4 % (ref 43.0–77.0)
PLATELETS: 331 10*3/uL (ref 150.0–400.0)
RBC: 4.93 Mil/uL (ref 3.87–5.11)
RDW: 14.4 % (ref 11.5–15.5)
WBC: 8.5 10*3/uL (ref 4.0–10.5)

## 2017-02-26 LAB — SEDIMENTATION RATE: SED RATE: 10 mm/h (ref 0–30)

## 2017-02-26 LAB — VITAMIN B12: Vitamin B-12: 618 pg/mL (ref 211–911)

## 2017-02-26 LAB — FOLATE: Folate: 17.5 ng/mL (ref >5.9)

## 2017-02-26 LAB — HEMOGLOBIN A1C: HEMOGLOBIN A1C: 7.4 % — AB (ref 4.6–6.5)

## 2017-02-27 ENCOUNTER — Telehealth: Payer: Self-pay | Admitting: Family Medicine

## 2017-02-27 ENCOUNTER — Encounter: Payer: Self-pay | Admitting: Family Medicine

## 2017-02-27 NOTE — Telephone Encounter (Signed)
Letter sent to patient regarding PFT appt

## 2017-04-01 ENCOUNTER — Ambulatory Visit (INDEPENDENT_AMBULATORY_CARE_PROVIDER_SITE_OTHER): Payer: Medicare HMO | Admitting: Internal Medicine

## 2017-04-01 DIAGNOSIS — J45991 Cough variant asthma: Secondary | ICD-10-CM | POA: Diagnosis not present

## 2017-04-01 DIAGNOSIS — R0609 Other forms of dyspnea: Principal | ICD-10-CM

## 2017-04-01 LAB — PULMONARY FUNCTION TEST
DL/VA % pred: 104 %
DL/VA: 4.74 ml/min/mmHg/L
DLCO COR % PRED: 99 %
DLCO COR: 21.43 ml/min/mmHg
DLCO unc % pred: 99 %
DLCO unc: 21.36 ml/min/mmHg
FEF 25-75 PRE: 1.59 L/s
FEF 25-75 Post: 1.54 L/sec
FEF2575-%Change-Post: -2 %
FEF2575-%Pred-Post: 151 %
FEF2575-%Pred-Pre: 155 %
FEV1-%Change-Post: 0 %
FEV1-%Pred-Post: 120 %
FEV1-%Pred-Pre: 119 %
FEV1-POST: 1.88 L
FEV1-Pre: 1.87 L
FEV1FVC-%Change-Post: 4 %
FEV1FVC-%Pred-Pre: 104 %
FEV6-%CHANGE-POST: -3 %
FEV6-%PRED-PRE: 122 %
FEV6-%Pred-Post: 118 %
FEV6-POST: 2.35 L
FEV6-Pre: 2.43 L
FEV6FVC-%Change-Post: 0 %
FEV6FVC-%PRED-POST: 107 %
FEV6FVC-%Pred-Pre: 107 %
FVC-%Change-Post: -3 %
FVC-%PRED-POST: 110 %
FVC-%PRED-PRE: 114 %
FVC-POST: 2.35 L
FVC-Pre: 2.43 L
POST FEV6/FVC RATIO: 100 %
PRE FEV1/FVC RATIO: 77 %
Post FEV1/FVC ratio: 80 %
Pre FEV6/FVC Ratio: 100 %

## 2017-04-01 NOTE — Progress Notes (Signed)
PFT done today. 

## 2017-04-05 ENCOUNTER — Other Ambulatory Visit: Payer: Self-pay | Admitting: Family Medicine

## 2017-04-07 NOTE — Telephone Encounter (Signed)
Received [facility] report today regarding call patient made on 04/05/17. Patient having problems with BP medication and her BP. Appointment scheduled for 04/09/17 with Dr. Lorelei Pont. Asked patient if she would like to come in today and see another provider,patient refused. Advised to call office or go to ED if she starts to have SOB or chest pains. Patient agreed.

## 2017-04-08 NOTE — Progress Notes (Signed)
[facility] [place], [place], [place] [address] [phone]  Date:  04/09/2017   Name:  Alexa [name]   DOB:  08-16-1929   MRN:  [id number]  PCP:  Darreld Mclean, MD    Chief Complaint: Medication Problem   History of Present Illness:  Alexa [name] is a 83 y.o. very pleasant female patient who presents with the following:  Here today to discuss her BP medications- we had to change her valsartan to a different med due to recall Also history of DM, which has been under ok control  She saw Dr. Larose Kells about 6 weeks ago- from his note:  Fatigue, DOE: going on for a year, depression seems to be well-controlled despite the loss of her daughter few months ago. Has the diagnosis of COPD, reports occ cough , + wheezing with exertion. No PFTs found in the chart. Had a CT chest d/t SOB 08-2016: No pulmonary emboli, no mention of COPD changes EKG today: Sinus tachycardia, d/w cards , no acute changes  Recent labs reviewed Plan: -for completeness we will repeat some of them: CMP, CBC, I3H, TSH, W86, folic acid, sedimentation rate. -Echocardiogram -PFTs and refer to pulmonary for dyspnea on exertion . COPD?. -Change Flovent to Symbicort HTN: will switch from valsartan to losartan today. Monitor BPs. Follow-up with PCP 2 weeks. ER if symptoms severe  Her PFTs did come back and look ok.  However she has not gotten a pulmonology appt - however it looks like she was scheduled to see Dr. Melvyn Novas and then declined appt as she was feeling better  BP Readings from Last 3 Encounters:  04/09/17 (!) 152/92  02/25/17 (!) 144/70  12/26/16 128/78   She is taking 25 mg of losartan- unfortunately her BP has been very high when she checks it at home- SBP may be near 200. She was doing ok on 80 of valsartan.  Will plan to increase her losartan dose.  She is tolerating the losartan wll  History of DM-her most recent A1c was acceptable on  diet control only She has had a cough for about 10 days she is bringing up some mucus from her chest.  Not wheezing or having any unusual SOB>  Her sinuses are also congested.   No fever but she is not feeling great overall   Lab Results  Component Value Date   HGBA1C 7.4 (H) 02/25/2017   Alexa [name] lost her daughter several months ago and is having a very hard time with this.  Her daughter lived near by and was her best friend and biggest support, for both emotional needs and practical things which are difficult for Alexa [name] and her husband at their advanced ages. Alexa [name] misses her terribly and is crying today- the situation is very sad.   Right now she is taking wellbutrin 75 BID, and taking buspar prn. However she admits that she is feeling very anxious and on edge and would be interested in trying to improve this.  Also, she is now ready to think about a support group to help with her grief.    Patient Active Problem List   Diagnosis Date Noted  . Nervous 06/05/2015  . Cough variant asthma 12/29/2014  . Upper airway cough syndrome 12/29/2014  . Spinal stenosis in cervical region 12/13/2014  . Radiculopathy, lumbar region 12/13/2014  . PERONEAL NEUROPATHY 05/03/2008  . Malignant neoplasm of kidney excluding renal pelvis ([place]) 08/06/2007  .  Controlled type 2 diabetes mellitus without complication, without long-term current use of insulin ([place]) 08/06/2007  . GERD 08/06/2007  . PULMONARY EMBOLISM, HX OF 08/06/2007  . Hyperlipidemia 09/03/2006  . Essential hypertension 09/03/2006  . IBS 09/03/2006    Past Medical History:  Diagnosis Date  . Asthma   . Chronic obstructive pulmonary disease ([place])   . Diverticulosis   . Diverticulosis of colon (without mention of hemorrhage)   . Esophageal reflux   . History of colonic polyps 06/05/05   hyperplastic  . Internal hemorrhoids   . Irritable bowel syndrome   . Ischemic colitis ([place])   . Malignant neoplasm of kidney and other and unspecified  urinary organs    renal carcinoma, left nephrectomy in 2008-per patient.  . Other and unspecified hyperlipidemia   . Personal history of venous thrombosis and embolism    PTE after nephrectomy  . Spinal stenosis   . Type II or unspecified type diabetes mellitus without mention of complication, not stated as uncontrolled 02/05/2012   pt states it was in 2007  . Unspecified essential hypertension     Past Surgical History:  Procedure Laterality Date  . BREAST BIOPSY    . COLONOSCOPY W/ POLYPECTOMY  2000  . COLONOSCOPY W/ POLYPECTOMY  07/2004   Hyperplastic polyps  . DILATION AND CURETTAGE OF UTERUS  03/2005   Uterine Polyps  . NEPHRECTOMY  2007   right,Dr Dahlstedt  . SEPTOPLASTY    . TONSILLECTOMY      Social History  Substance Use Topics  . Smoking status: Never Smoker  . Smokeless tobacco: Never Used  . Alcohol use No    Family History  Problem Relation Age of Onset  . Stroke Mother        onset in 32s  . Diabetes Mother   . Cancer Mother        bladder; nephrectomy for calculi/also uterine , TAH & BSO  . Aneurysm Mother        thoracic  . Depression Mother   . Stroke Brother   . Heart failure Brother   . Heart attack Other        maternal family history  . Heart failure Maternal Grandfather   . Lung cancer Father        smoked  . Heart failure Sister   . Depression Sister   . Alzheimer's disease Sister   . COPD Sister   . Aneurysm Brother        cns  . Depression Maternal Aunt        X2  . Bipolar disorder Sister   . Alcoholism Neg Hx     Allergies  Allergen Reactions  . Lisinopril     Cough D/Ced by Dr Melvyn Novas  . Codeine     nausea  . Verapamil     Pain in feet    Medication list has been reviewed and updated.  Current Outpatient Prescriptions on File Prior to Visit  Medication Sig Dispense Refill  . albuterol (PROAIR HFA) 108 (90 Base) MCG/ACT inhaler Inhale 1-2 puffs into the lungs every 6 (six) hours as needed for wheezing or shortness of  breath. 8.5 Inhaler 5  . Ascorbic Acid (VITAMIN C) POWD Take 1 packet by mouth 2 (two) times daily as needed (cold symptoms).    Alexa [name] Kitchen aspirin EC 81 MG tablet Take 162 mg by mouth daily.    . Benzalkonium Chloride (DIABETIC BASICS HEALTHY FOOT EX) Apply 1 application topically daily as needed (foot pain).  OTC diabetic foot cream    . Blood Glucose Monitoring Suppl (TRUERESULT BLOOD GLUCOSE) W/DEVICE KIT Use to test blood sugar ICD 10 E11 9 1 each 0  . budesonide-formoterol (SYMBICORT) 80-4.5 MCG/ACT inhaler Inhale 2 puffs into the lungs 2 (two) times daily. 1 Inhaler 3  . buPROPion (WELLBUTRIN) 75 MG tablet Take 1 tablet (75 mg total) by mouth 2 (two) times daily. 180 tablet 3  . CALCIUM PO Take 1 tablet by mouth daily.    . Cholecalciferol (VITAMIN D PO) Take 1 tablet by mouth daily.    Alexa [name] Kitchen CRANBERRY PO Take 2 capsules by mouth 2 (two) times daily.     Alexa [name] Kitchen ESTRACE VAGINAL 0.1 MG/GM vaginal cream Apply locally every other night at bedtime  1  . fluticasone (FLONASE) 50 MCG/ACT nasal spray Place 2 sprays into both nostrils daily. 16 g 6  . gabapentin (NEURONTIN) 100 MG capsule take 1 capsule by mouth four times a day 120 capsule 4  . glucose blood (TRUETEST TEST) test strip Use to test blood sugar ICD 10 E11 9 100 each 3  . Multiple Vitamins-Minerals (EYE VITAMINS PO) Take by mouth.    . polyethylene glycol powder (GLYCOLAX/MIRALAX) powder Take 0.5 Containers by mouth daily as needed for mild constipation. Use as needed    . RA P COL-RITE 8.6-50 MG tablet take 1 tablet by mouth once daily 30 tablet 3  . ranitidine (ZANTAC) 150 MG capsule Take 1 capsule (150 mg total) by mouth 2 (two) times daily. 180 capsule 3  . traMADol (ULTRAM) 50 MG tablet take 1 tablet by mouth every 6 hours if needed 120 tablet 2  . TRUEPLUS LANCETS 28G MISC Use to test blood sugar ICD 10 E11 9 100 each 3  . [DISCONTINUED] DULoxetine (CYMBALTA) 30 MG capsule Take 1 capsule (30 mg total) by mouth daily. 30 capsule 0   No current  facility-administered medications on file prior to visit.     Review of Systems:  As per HPI- otherwise negative.   Physical Examination: Vitals:   04/09/17 1428 04/09/17 1437  BP: (!) 171/83 (!) 152/92  Pulse: 96   Temp: 98.3 F (36.8 C)   SpO2: 95%    Vitals:   04/09/17 1428  Weight: 139 lb 9.6 oz (63.3 kg)  Height: 5' 3"  (1.6 m)   Body mass index is 24.73 kg/m. Ideal Body Weight: Weight in (lb) to have BMI = 25: 140.8  GEN: WDWN, NAD, Non-toxic, A & O x 3, older lady who looks well but is intermittently tearful today HEENT: Atraumatic, Normocephalic. Neck supple. No masses, No LAD. Ears and Nose: No external deformity. CV: RRR, No M/G/R. No JVD. No thrill. No extra heart sounds. PULM: CTA B, no wheezes, crackles, rhonchi. No retractions. No resp. distress. No accessory muscle use. ABD: S, NT, ND EXTR: No c/c/e NEURO Normal gait.  PSYCH: Normally interactive. Conversant. Not depressed or anxious appearing.  Calm demeanor.    Assessment and Plan: Essential hypertension - Plan: losartan (COZAAR) 50 MG tablet  Controlled type 2 diabetes mellitus without complication, without long-term current use of insulin ([facility])  Bereavement - Plan: busPIRone (BUSPAR) 7.5 MG tablet  Acute bronchitis with COPD ([place]) - Plan: doxycycline (VIBRAMYCIN) 100 MG capsule  Grief at loss of child  Here today for a follow-up visit Increased her losartan to 50, can go up to 100 if needed.  She will let me know how her BP looks on the 50 mg and we can increase if  needed Doxycycline for chest congestion and cough for 10 days in this elderly person Offered support and encouragement to her, discussed support resources for bereaved parents. She is feeling lonely and like she needs more to do, so discussed some volunteer opportunities like the foster grandparent program Will have her increase her buspar to 7.5 mg BID scheduled, may taper up to 15 mg   I will plan to call her in about 2 weeks to  check on her progress- she will let me know if not doing ok in the meantime  Signed Lamar Blinks, MD

## 2017-04-09 ENCOUNTER — Ambulatory Visit (INDEPENDENT_AMBULATORY_CARE_PROVIDER_SITE_OTHER): Payer: Medicare HMO | Admitting: Family Medicine

## 2017-04-09 ENCOUNTER — Encounter: Payer: Self-pay | Admitting: Family Medicine

## 2017-04-09 VITALS — BP 152/92 | HR 96 | Temp 98.3°F | Ht 63.0 in | Wt 139.6 lb

## 2017-04-09 DIAGNOSIS — J209 Acute bronchitis, unspecified: Secondary | ICD-10-CM

## 2017-04-09 DIAGNOSIS — Z634 Disappearance and death of family member: Secondary | ICD-10-CM

## 2017-04-09 DIAGNOSIS — F4321 Adjustment disorder with depressed mood: Secondary | ICD-10-CM

## 2017-04-09 DIAGNOSIS — I10 Essential (primary) hypertension: Secondary | ICD-10-CM

## 2017-04-09 DIAGNOSIS — J440 Chronic obstructive pulmonary disease with acute lower respiratory infection: Secondary | ICD-10-CM

## 2017-04-09 DIAGNOSIS — E119 Type 2 diabetes mellitus without complications: Secondary | ICD-10-CM | POA: Diagnosis not present

## 2017-04-09 MED ORDER — DOXYCYCLINE HYCLATE 100 MG PO CAPS: 100.0000 mg | ORAL_CAPSULE | Freq: Two times a day (BID) | ORAL | 0 refills | Status: DC

## 2017-04-09 MED ORDER — BUSPIRONE HCL 7.5 MG PO TABS: 7.5000 mg | ORAL_TABLET | Freq: Two times a day (BID) | ORAL | 6 refills | Status: DC

## 2017-04-09 MED ORDER — LOSARTAN POTASSIUM 50 MG PO TABS: 50.0000 mg | ORAL_TABLET | Freq: Every day | ORAL | 3 refills | Status: DC

## 2017-04-09 NOTE — Patient Instructions (Addendum)
It may be helpful for you to find a group of other parents who have lost a child- they can understand what you are going through better than anyone else  Compassionate Friends network-  If you are a member (or a friend) of a family that has suffered the death of a child, The [employer] is here to help you and provide support for the family. Feel free to contact [employer]'s National Office at [phone] or nationaloffice@compassionatefriends .org if you need assistance in finding a local Chapter or you have any other questions  Also [facility] offers bereavement support-  For more information, please call us at [phone] or email [place] at [email or web address] [email or web address]. We look forward to hearing from you  When you start to feel stronger, you may benefit from becoming more involved in your community - the [employer] program may interest you  [employer] matches senior adults age 73 and over with children and youth. The seniors serve as role models and mentors for children in childcare centers, elementary schools and after-school programs. [employer] work one-on-one with children offering specialized assistance to help youngsters in reaching their potential. [employer] serve 20 hours per week and receive a stipend. The program is funded by [employer] for [employer] and [employer], [employer], and is sponsored in [employer] by [employer] of [employer]. [place], Theressa Millard               We are going to increase your losartan to 50 mg- however we can go up to 100 mg if necessary- please let me know how your BP does   We are going to use doxycycline 100 mg twice a day for 10 days for cough/ bronchitis  For sadness and anxiety, please increase to buspirone 7.5 twice a day.  You can build up to 2 pills twice a day over the course of 7- 10 days

## 2017-04-30 ENCOUNTER — Telehealth: Payer: Self-pay | Admitting: *Deleted

## 2017-04-30 NOTE — Telephone Encounter (Signed)
Received fax from [facility] [employer] on behalf of Alexa [name] requesting information regarding adverse events experienced following use of Valsartan for patient and to reference Case [id number]; forwarded to provider/SLS 10/03

## 2017-05-23 ENCOUNTER — Telehealth: Payer: Self-pay | Admitting: Family Medicine

## 2017-05-23 NOTE — Telephone Encounter (Signed)
Caller name: Orvil Feil Relation to pt: Triage Nurse Call back number: pt tel [phone] and triage nurse tel  [phone] Pharmacy: [facility], [place] [address] [id number]  Reason for call: Pt called triage nurse stating is completely out of meds losartan (COZAAR) 50 MG tablet and is needing a refill. Pt misunderstood that she supposed to take just one pill a day and instead she took two a day, pt is now completely out of meds and is needing refill ASAP. Please advise.

## 2017-05-23 NOTE — Telephone Encounter (Signed)
Patient Name: Alexa [name] DOB: 1930-05-02 Initial Comment Caller states she misunderstood the directions on the prescription the MD changed for her BP medication and now she has run out. States her BP has been fine. Nurse Assessment Nurse: Cherie Dark RN, Jarrett Soho Date/Time (Eastern Time): 05/23/2017 12:50:01 PM Confirm and document reason for call. If symptomatic, describe symptoms. ---Caller states her doctor had recently changed the dosage of her medication for her blood pressure. Losartan Potassium 50mg  is her BP med she is prescribed but ended up taking two tablets a day. She had a 90 days supply but has been taking them 2 a day. Does the patient have any new or worsening symptoms? ---Yes Will a triage be completed? ---Yes Related visit to physician within the last 2 weeks? ---No Does the PT have any chronic conditions? (i.e. diabetes, asthma, etc.) ---Yes List chronic conditions. ---HTN, asthma Is this a behavioral health or substance abuse call? ---No Guidelines Guideline Title Affirmed Question Affirmed Notes High Blood Pressure Ran out of BP medications Final Disposition User Call PCP within 24 Hours [place], RN, Jarrett Soho Comments this am her BP was 168/102 Patient stated she was last seen on 9/12 and was prescribed a 90 days supply of Losartan 50mg  and was supposed to take it once a day but has been taking it BID and is now out. Would like a refill of her meds sent to the [facility] at [phone]. spoke with the lady at the office at informed her that the patient is out of her meds and she took the patient's information and stated she would let the nurse and the provider know that the patient is out of meds and is needing a refill. Caller Disagree/Comply Comply Caller Understands Yes PreDisposition Call Doctor

## 2017-05-27 ENCOUNTER — Other Ambulatory Visit: Payer: Self-pay | Admitting: Emergency Medicine

## 2017-05-27 DIAGNOSIS — I10 Essential (primary) hypertension: Secondary | ICD-10-CM

## 2017-05-27 MED ORDER — LOSARTAN POTASSIUM 50 MG PO TABS: 50.0000 mg | ORAL_TABLET | Freq: Every day | ORAL | 3 refills | Status: DC

## 2017-05-27 NOTE — Telephone Encounter (Signed)
Refill has been sent to pharmacy per pt request.

## 2017-05-27 NOTE — Telephone Encounter (Signed)
losartan (COZAAR) 50 MG tablet °

## 2017-06-05 ENCOUNTER — Telehealth: Payer: Self-pay | Admitting: Family Medicine

## 2017-06-05 NOTE — Telephone Encounter (Signed)
Called pt back- pt states that she did not know the losartan was called in.  It sounds like the pharmacy did not alert her. She was able to pick it up She will see me as needed

## 2017-06-05 NOTE — Telephone Encounter (Signed)
Pharmacy states pt picked up rx fo losartan on 04/09/17.

## 2017-06-05 NOTE — Telephone Encounter (Addendum)
[facility] transferred call back to practice stating she needs to be seen but does not want to. RN states pt meds need to be called in if nothing else. SPOKE to pt informed her Losartan was sent to [facility] on 05/27/17 #90 pt says they tell her they do not have them. Pt request we call the pharmacy and help her get it straightened out.. Per RN at [facility] pt BP 178/11. Please call pharmacy and pt. [place] pt. Verified pt ph [phone].

## 2017-06-05 NOTE — Telephone Encounter (Signed)
Patent calling about elevated blood pressure. Patient stated that she took 3 25mg  tablets yesterday. Patient stated that she had to take the 3 25mg  tablets to get her blood pressure down. Patient stated that this medication does not work at all. Patient complaining of ha for the past week. Patient transferred to [facility].

## 2017-06-06 ENCOUNTER — Encounter (HOSPITAL_COMMUNITY): Payer: Self-pay | Admitting: Emergency Medicine

## 2017-06-06 ENCOUNTER — Emergency Department (HOSPITAL_COMMUNITY): Payer: Medicare HMO

## 2017-06-06 ENCOUNTER — Other Ambulatory Visit: Payer: Self-pay

## 2017-06-06 ENCOUNTER — Emergency Department (HOSPITAL_COMMUNITY)
Admission: EM | Admit: 2017-06-06 | Discharge: 2017-06-06 | Disposition: A | Discharge status: Home / Self Care | Payer: Medicare HMO | Attending: Emergency Medicine | Admitting: Emergency Medicine

## 2017-06-06 DIAGNOSIS — N390 Urinary tract infection, site not specified: Secondary | ICD-10-CM

## 2017-06-06 DIAGNOSIS — R51 Headache: Secondary | ICD-10-CM | POA: Insufficient documentation

## 2017-06-06 DIAGNOSIS — R079 Chest pain, unspecified: Secondary | ICD-10-CM | POA: Diagnosis not present

## 2017-06-06 DIAGNOSIS — J45909 Unspecified asthma, uncomplicated: Secondary | ICD-10-CM | POA: Diagnosis not present

## 2017-06-06 DIAGNOSIS — I10 Essential (primary) hypertension: Secondary | ICD-10-CM | POA: Insufficient documentation

## 2017-06-06 DIAGNOSIS — F419 Anxiety disorder, unspecified: Secondary | ICD-10-CM

## 2017-06-06 DIAGNOSIS — Z7982 Long term (current) use of aspirin: Secondary | ICD-10-CM | POA: Diagnosis not present

## 2017-06-06 DIAGNOSIS — F4322 Adjustment disorder with anxiety: Secondary | ICD-10-CM | POA: Insufficient documentation

## 2017-06-06 DIAGNOSIS — J449 Chronic obstructive pulmonary disease, unspecified: Secondary | ICD-10-CM | POA: Insufficient documentation

## 2017-06-06 DIAGNOSIS — F4320 Adjustment disorder, unspecified: Secondary | ICD-10-CM | POA: Diagnosis not present

## 2017-06-06 DIAGNOSIS — Z79899 Other long term (current) drug therapy: Secondary | ICD-10-CM | POA: Diagnosis not present

## 2017-06-06 DIAGNOSIS — E1140 Type 2 diabetes mellitus with diabetic neuropathy, unspecified: Secondary | ICD-10-CM | POA: Diagnosis not present

## 2017-06-06 DIAGNOSIS — F4321 Adjustment disorder with depressed mood: Secondary | ICD-10-CM

## 2017-06-06 LAB — BASIC METABOLIC PANEL
ANION GAP: 10 (ref 5–15)
BUN: 19 mg/dL (ref 6–20)
CALCIUM: 9.6 mg/dL (ref 8.9–10.3)
CO2: 25 mmol/L (ref 22–32)
Chloride: 103 mmol/L (ref 101–111)
Creatinine, Ser: 1.04 mg/dL — ABNORMAL HIGH (ref 0.44–1.00)
GFR, EST AFRICAN AMERICAN: 55 mL/min — AB (ref >60)
GFR, EST NON AFRICAN AMERICAN: 48 mL/min — AB (ref >60)
GLUCOSE: 193 mg/dL — AB (ref 65–99)
POTASSIUM: 3.9 mmol/L (ref 3.5–5.1)
SODIUM: 138 mmol/L (ref 135–145)

## 2017-06-06 LAB — CBC
HEMATOCRIT: 45.1 % (ref 36.0–46.0)
HEMOGLOBIN: 15.4 g/dL — AB (ref 12.0–15.0)
MCH: 30 pg (ref 26.0–34.0)
MCHC: 34.1 g/dL (ref 30.0–36.0)
MCV: 87.9 fL (ref 78.0–100.0)
Platelets: 346 10*3/uL (ref 150–400)
RBC: 5.13 MIL/uL — ABNORMAL HIGH (ref 3.87–5.11)
RDW: 13.4 % (ref 11.5–15.5)
WBC: 9.8 10*3/uL (ref 4.0–10.5)

## 2017-06-06 LAB — I-STAT TROPONIN, ED
TROPONIN I, POC: 0 ng/mL (ref 0.00–0.08)
TROPONIN I, POC: 0 ng/mL (ref 0.00–0.08)

## 2017-06-06 LAB — URINALYSIS, ROUTINE W REFLEX MICROSCOPIC
BILIRUBIN URINE: NEGATIVE
Glucose, UA: NEGATIVE mg/dL
Hgb urine dipstick: NEGATIVE
KETONES UR: NEGATIVE mg/dL
Nitrite: NEGATIVE
Protein, ur: NEGATIVE mg/dL
SPECIFIC GRAVITY, URINE: 1.014 (ref 1.005–1.030)
pH: 6 (ref 5.0–8.0)

## 2017-06-06 MED ORDER — CEPHALEXIN 500 MG PO CAPS: 500.0000 mg | ORAL_CAPSULE | Freq: Four times a day (QID) | ORAL | 0 refills | Status: DC

## 2017-06-06 MED ORDER — LORAZEPAM 0.5 MG PO TABS
0.5000 mg | ORAL_TABLET | Freq: Once | ORAL | Status: AC
  Administered 2017-06-06: 0.5 mg via ORAL
  Filled 2017-06-06: qty 1

## 2017-06-06 MED ORDER — HYDROXYZINE HCL 25 MG PO TABS: 25.0000 mg | ORAL_TABLET | Freq: Three times a day (TID) | ORAL | 0 refills | Status: DC | PRN

## 2017-06-06 NOTE — ED Triage Notes (Signed)
Pt verbalizes worsening high blood pressure over past two weeks with associated headache and "a little bit of left chest pain with laying down;" unable to get pressure down with medication. Pt verbalizes recent stressors related to deaths in family. Pt denies unilateral numbness or weakness.

## 2017-06-06 NOTE — ED Provider Notes (Signed)
[facility] DEPT Provider Note   CSN: [id number] Arrival date & time: 06/06/17  1253     History   Chief Complaint Chief Complaint  Patient presents with  . Hypertension  . Chest Pain    HPI Alexa [name] is a 83 y.o. female.  HPI  Pt was seen at 1530. Per pt, c/o gradual onset and persistence of "high blood pressure" for the past 6 months. Pt states her BP meds were changed 6 months ago before her symptoms began. Pt states her BP meds were increased 3 months ago and "they're just not working." States she checks her BP multiple times each day/every day, and over the past 2 weeks "just keeps going up and down." She has been taking extra doses of her BP medication "to bring it down." Pt endorses significant stressors over the past year with "2 deaths in family as well as being a caregiver to 3 small children." Denies CP/palpitations, no SOB/cough, no abd pain, no N/V/D, no SI/SA, no hallucinations, no visual changes, no focal motor weakness, no tingling/numbness in extremities, no ataxia, no slurred speech, no facial droop.    Past Medical History:  Diagnosis Date  . Asthma   . Chronic obstructive pulmonary disease ([place])   . Diverticulosis   . Diverticulosis of colon (without mention of hemorrhage)   . Esophageal reflux   . History of colonic polyps 06/05/05   hyperplastic  . Internal hemorrhoids   . Irritable bowel syndrome   . Ischemic colitis ([place])   . Malignant neoplasm of kidney and other and unspecified urinary organs    renal carcinoma, left nephrectomy in 2008-per patient.  . Other and unspecified hyperlipidemia   . Personal history of venous thrombosis and embolism    PTE after nephrectomy  . Spinal stenosis   . Type II or unspecified type diabetes mellitus without mention of complication, not stated as uncontrolled 02/05/2012   pt states it was in 2007  . Unspecified essential hypertension     Patient Active Problem List   Diagnosis  Date Noted  . Nervous 06/05/2015  . Cough variant asthma 12/29/2014  . Upper airway cough syndrome 12/29/2014  . Spinal stenosis in cervical region 12/13/2014  . Radiculopathy, lumbar region 12/13/2014  . PERONEAL NEUROPATHY 05/03/2008  . Malignant neoplasm of kidney excluding renal pelvis ([place]) 08/06/2007  . Controlled type 2 diabetes mellitus without complication, without long-term current use of insulin ([place]) 08/06/2007  . GERD 08/06/2007  . PULMONARY EMBOLISM, HX OF 08/06/2007  . Hyperlipidemia 09/03/2006  . Essential hypertension 09/03/2006  . IBS 09/03/2006    Past Surgical History:  Procedure Laterality Date  . BREAST BIOPSY    . COLONOSCOPY W/ POLYPECTOMY  2000  . COLONOSCOPY W/ POLYPECTOMY  07/2004   Hyperplastic polyps  . DILATION AND CURETTAGE OF UTERUS  03/2005   Uterine Polyps  . NEPHRECTOMY  2007   right,Dr Dahlstedt  . SEPTOPLASTY    . TONSILLECTOMY      OB History    No data available       Home Medications    Prior to Admission medications   Medication Sig Start Date End Date Taking? Authorizing Provider  albuterol (PROAIR HFA) 108 (90 Base) MCG/ACT inhaler Inhale 1-2 puffs into the lungs every 6 (six) hours as needed for wheezing or shortness of breath. 02/01/16   Copland, Gay Filler, MD  Ascorbic Acid (VITAMIN C) POWD Take 1 packet by mouth 2 (two) times daily as needed (cold  symptoms).    [provider]  aspirin EC 81 MG tablet Take 162 mg by mouth daily.    [provider]  Benzalkonium Chloride (DIABETIC BASICS HEALTHY FOOT EX) Apply 1 application topically daily as needed (foot pain). OTC diabetic foot cream    [provider]  Blood Glucose Monitoring Suppl (TRUERESULT BLOOD GLUCOSE) W/DEVICE KIT Use to test blood sugar ICD 10 E11 9 07/14/14   Hendricks Limes, MD  budesonide-formoterol [facility]) 80-4.5 MCG/ACT inhaler Inhale 2 puffs into the lungs 2 (two) times daily. 02/25/17   Colon Branch, MD  buPROPion (WELLBUTRIN)  75 MG tablet Take 1 tablet (75 mg total) by mouth 2 (two) times daily. 05/27/16   Copland, Gay Filler, MD  busPIRone (BUSPAR) 7.5 MG tablet Take 1 tablet (7.5 mg total) by mouth 2 (two) times daily. May increase to 2 pills daily 04/09/17   Copland, Gay Filler, MD  CALCIUM PO Take 1 tablet by mouth daily.    [provider]  Cholecalciferol (VITAMIN D PO) Take 1 tablet by mouth daily.    [provider]  CRANBERRY PO Take 2 capsules by mouth 2 (two) times daily.     [provider]  doxycycline (VIBRAMYCIN) 100 MG capsule Take 1 capsule (100 mg total) by mouth 2 (two) times daily. 04/09/17   Copland, Gay Filler, MD  ESTRACE VAGINAL 0.1 MG/GM vaginal cream Apply locally every other night at bedtime 01/16/16   [provider]  fluticasone (FLONASE) 50 MCG/ACT nasal spray Place 2 sprays into both nostrils daily. 06/06/16   Copland, Gay Filler, MD  gabapentin (NEURONTIN) 100 MG capsule take 1 capsule by mouth four times a day 11/20/16   Copland, Gay Filler, MD  glucose blood (TRUETEST TEST) test strip Use to test blood sugar ICD 10 E11 9 07/14/14   Hendricks Limes, MD  losartan (COZAAR) 50 MG tablet Take 1 tablet (50 mg total) by mouth daily. 05/27/17   Copland, Gay Filler, MD  Multiple Vitamins-Minerals (EYE VITAMINS PO) Take by mouth.    [provider]  polyethylene glycol powder (GLYCOLAX/MIRALAX) powder Take 0.5 Containers by mouth daily as needed for mild constipation. Use as needed 11/15/15   [provider]  RA P COL-RITE 8.6-50 MG tablet take 1 tablet by mouth once daily 12/08/15   Hoyt Koch, MD  ranitidine (ZANTAC) 150 MG capsule Take 1 capsule (150 mg total) by mouth 2 (two) times daily. 05/27/16   Copland, Gay Filler, MD  traMADol (ULTRAM) 50 MG tablet take 1 tablet by mouth every 6 hours if needed 12/12/16   Copland, Gay Filler, MD  TRUEPLUS LANCETS 28G MISC Use to test blood sugar ICD 10 E11 9 07/14/14   Hendricks Limes, MD    Family  History Family History  Problem Relation Age of Onset  . Stroke Mother        onset in 32s  . Diabetes Mother   . Cancer Mother        bladder; nephrectomy for calculi/also uterine , TAH & BSO  . Aneurysm Mother        thoracic  . Depression Mother   . Stroke Brother   . Heart failure Brother   . Heart attack Other        maternal family history  . Heart failure Maternal Grandfather   . Lung cancer Father        smoked  . Heart failure Sister   . Depression Sister   .  Alzheimer's disease Sister   . COPD Sister   . Aneurysm Brother        cns  . Depression Maternal Aunt        X2  . Bipolar disorder Sister   . Alcoholism Neg Hx     Social History Social History   Tobacco Use  . Smoking status: Never Smoker  . Smokeless tobacco: Never Used  Substance Use Topics  . Alcohol use: No  . Drug use: No     Allergies   Lisinopril; Codeine; and Verapamil   Review of Systems Review of Systems ROS: Statement: All systems negative except as marked or noted in the HPI; Constitutional: Negative for fever and chills. ; ; Eyes: Negative for eye pain, redness and discharge. ; ; ENMT: Negative for ear pain, hoarseness, nasal congestion, sinus pressure and sore throat. ; ; Cardiovascular: Negative for chest pain, palpitations, diaphoresis, dyspnea and peripheral edema. ; ; Respiratory: Negative for cough, wheezing and stridor. ; ; Gastrointestinal: Negative for nausea, vomiting, diarrhea, abdominal pain, blood in stool, hematemesis, jaundice and rectal bleeding. . ; ; Genitourinary: Negative for dysuria, flank pain and hematuria. ; ; Musculoskeletal: Negative for back pain and neck pain. Negative for swelling and trauma.; ; Skin: Negative for pruritus, rash, abrasions, blisters, bruising and skin lesion.; ; Neuro: +headache. Negative for lightheadedness and neck stiffness. Negative for weakness, altered level of consciousness, altered mental status, extremity weakness, paresthesias,  involuntary movement, seizure and syncope.; Psych:  +anxiety, grief. No SI, no SA, no HI, no hallucinations.       Physical Exam Updated Vital Signs BP (!) 182/88   Pulse 88   Resp (!) 21   SpO2 100%   Physical Exam 1535: Physical examination:  Nursing notes reviewed; Vital signs and O2 SAT reviewed;  Constitutional: Well developed, Well nourished, Well hydrated, In no acute distress; Head:  Normocephalic, atraumatic; Eyes: EOMI, PERRL, No scleral icterus; ENMT: Mouth and pharynx normal, Mucous membranes moist; Neck: Supple, Full range of motion, No lymphadenopathy; Cardiovascular: Regular rate and rhythm, No gallop; Respiratory: Breath sounds clear & equal bilaterally, No wheezes.  Speaking full sentences with ease, Normal respiratory effort/excursion; Chest: Nontender, Movement normal; Abdomen: Soft, Nontender, Nondistended, Normal bowel sounds; Genitourinary: No CVA tenderness; Extremities: Pulses normal, No tenderness, No edema, No calf edema or asymmetry.; Neuro: AA&Ox3, Major CN grossly intact. No facial droop. Speech clear. No gross focal motor or sensory deficits in extremities.; Skin: Color normal, Warm, Dry.; Psych:  Anxious, easily argumentative and agitated.     ED Treatments / Results  Labs (all labs ordered are listed, but only abnormal results are displayed)   EKG  EKG Interpretation  Date/Time:  Friday June 06 2017 13:05:25 EST Ventricular Rate:  84 PR Interval:    QRS Duration: 70 QT Interval:  372 QTC Calculation: 440 R Axis:   -37 Text Interpretation:  Sinus rhythm Left axis deviation Anterior infarct, old Borderline repolarization abnormality Baseline wander When compared with ECG of 09/04/2016 No significant change was found Confirmed by Francine Graven [phone]) on 06/06/2017 4:21:33 PM       Radiology   Procedures Procedures (including critical care time)  Medications Ordered in ED Medications  LORazepam (ATIVAN) tablet 0.5 mg (0.5 mg Oral Given  06/06/17 1601)     Initial Impression / Assessment and Plan / ED Course  I have reviewed the triage vital signs and the nursing notes.  Pertinent labs & imaging results that were available during my care of the patient  were reviewed by me and considered in my medical decision making (see chart for details).  MDM Reviewed: previous chart, nursing note and vitals Reviewed previous: labs and ECG Interpretation: labs, ECG, x-ray and CT scan   Results for orders placed or performed during the hospital encounter of 69/67/89  Basic metabolic panel  Result Value Ref Range   Sodium 138 135 - 145 mmol/L   Potassium 3.9 3.5 - 5.1 mmol/L   Chloride 103 101 - 111 mmol/L   CO2 25 22 - 32 mmol/L   Glucose, Bld 193 (H) 65 - 99 mg/dL   BUN 19 6 - 20 mg/dL   Creatinine, Ser 1.04 (H) 0.44 - 1.00 mg/dL   Calcium 9.6 8.9 - 10.3 mg/dL   GFR calc non Af Amer 48 (L) >60 mL/min   GFR calc Af Amer 55 (L) >60 mL/min   Anion gap 10 5 - 15  CBC  Result Value Ref Range   WBC 9.8 4.0 - 10.5 K/uL   RBC 5.13 (H) 3.87 - 5.11 MIL/uL   Hemoglobin 15.4 (H) 12.0 - 15.0 g/dL   HCT 45.1 36.0 - 46.0 %   MCV 87.9 78.0 - 100.0 fL   MCH 30.0 26.0 - 34.0 pg   MCHC 34.1 30.0 - 36.0 g/dL   RDW 13.4 11.5 - 15.5 %   Platelets 346 150 - 400 K/uL  Urinalysis, Routine w reflex microscopic  Result Value Ref Range   Color, Urine AMBER (A) YELLOW   APPearance CLOUDY (A) CLEAR   Specific Gravity, Urine 1.014 1.005 - 1.030   pH 6.0 5.0 - 8.0   Glucose, UA NEGATIVE NEGATIVE mg/dL   Hgb urine dipstick NEGATIVE NEGATIVE   Bilirubin Urine NEGATIVE NEGATIVE   Ketones, ur NEGATIVE NEGATIVE mg/dL   Protein, ur NEGATIVE NEGATIVE mg/dL   Nitrite NEGATIVE NEGATIVE   Leukocytes, UA LARGE (A) NEGATIVE   RBC / HPF 6-30 0 - 5 RBC/hpf   WBC, UA TOO NUMEROUS TO COUNT 0 - 5 WBC/hpf   Bacteria, UA RARE (A) NONE SEEN   Squamous Epithelial / LPF 0-5 (A) NONE SEEN   WBC Clumps PRESENT    Mucus PRESENT    Budding Yeast PRESENT   I-stat  troponin, ED  Result Value Ref Range   Troponin i, poc 0.00 0.00 - 0.08 ng/mL   Comment 3          I-stat troponin, ED  Result Value Ref Range   Troponin i, poc 0.00 0.00 - 0.08 ng/mL   Comment 3           Dg Chest 2 View Result Date: 06/06/2017 CLINICAL DATA:  Chest pain EXAM: CHEST  2 VIEW COMPARISON:  07/06/2015 FINDINGS: Heart size is normal. Aorta is uncoiled with mild atherosclerotic disease. Negative for heart failure. Lungs are clear without infiltrate or effusion. Degenerative change in the lumbar spine IMPRESSION: No active cardiopulmonary disease. Electronically Signed   By: Franchot Gallo M.D.   On: 06/06/2017 13:49   Ct Head Wo Contrast Result Date: 06/06/2017 CLINICAL DATA:  Hypertension and headaches EXAM: CT HEAD WITHOUT CONTRAST TECHNIQUE: Contiguous axial images were obtained from the base of the skull through the vertex without intravenous contrast. COMPARISON:  02/08/2011 FINDINGS: Brain: Old right cerebellar infarct is noted along the medial aspect stable from the prior exam. No findings to suggest acute hemorrhage, acute infarction or space-occupying mass lesion are noted. Vascular: No hyperdense vessel or unexpected calcification. Skull: Normal. Negative for fracture or focal lesion.  Sinuses/Orbits: No acute finding. Other: None. IMPRESSION: Chronic changes without acute abnormality. Electronically Signed   By: Inez Catalina M.D.   On: 06/06/2017 16:33     1615:  Long d/w pt regarding BP control, including, but not limited to:  Checking home BP monitor at doctor's office to compare readings, not taking BP multiple times per day/every day, control of anxiety/depression/grief, as well as taking meds as prescribed. Pt initially resistant to conversation, then became tearful, and started to verbalized her stressors (loss of 2 family members, caring for 3 small children). Pt then began to state she "had a headache" and started to take her BP meds out of her purse stating "I know  it's my blood pressure, I need to take my medication." I explained to pt that her BP was 150/88 and that her headache was most likely not due to her BP but to her anxiety. Pt became argumentative, but then more agreeable when she realized her BP was not "drastically elevated." Pt again tearful. I offered her ativan, and to speak with [facility] counselor for outpatient resources for her grief and anxiety. Pt agreeable.   1810:  Pt much calmer after ativan and d/w [facility] counselor. Pt denies SI/HI/hallucinations and received outpatient resources. Pt is now cooperative and pleasant now to staff, less argumentative. Pt requesting "something for my nerves" at home; will rx atarax. Pt states she "thinks I have a UTI;" so will tx with keflex. Pt states she "feels better now" and wants to go home. Has thanked myself and staff for our help today. Dx and testing d/w pt and friend.  Questions answered.  Verb understanding, agreeable to d/c home with outpt f/u.      Final Clinical Impressions(s) / ED Diagnoses   Final diagnoses:  None    ED Discharge Orders    None       Francine Graven, DO 06/10/17 [id number]

## 2017-06-06 NOTE — ED Notes (Signed)
Dr. Thurnell Garbe made aware of current v.s.; and per her order it is ok to d/c pt., which we do.

## 2017-06-06 NOTE — Discharge Instructions (Signed)
Take the prescriptions as directed.  Take your blood pressure only once per day, only once or twice per week, either in the morning approximately 1 hour after you take your medicine(s) or in the evening before you go to bed.  Always sit quietly for at least 15 to 20 minutes before taking your blood pressure.  Keep a diary of your blood pressures to show your doctor at your follow up office visit.  Call your regular medical doctor on Monday morning to schedule a follow up appointment in the next 3 days.  Return to the Emergency Department immediately sooner if worsening.

## 2017-06-06 NOTE — Progress Notes (Signed)
CSW spoke to the EDP regarding consult for outpatient resources and EDP informed this writer "Gorman" spoke to the pt and offered resources for grief.  Please reconsult if future social work needs arise.  CSW signing off, as social work intervention is not  Needed at this time.  Alphonse Guild. Onita Pfluger, LCSW, LCAS, CSI Clinical Social Worker Ph: [phone]

## 2017-06-06 NOTE — ED Notes (Signed)
Pt t stated that she is ready to go home. That it did not benefit her to come to the ED today. Pt does not wish to continue tests.

## 2017-06-08 LAB — URINE CULTURE: Culture: >=100000 — AB

## 2017-06-09 ENCOUNTER — Telehealth: Payer: Self-pay | Admitting: Emergency Medicine

## 2017-06-09 NOTE — Telephone Encounter (Signed)
Post ED Visit - Positive Culture Follow-up  Culture report reviewed by antimicrobial stewardship pharmacist:  []  Elenor Quinones, Pharm.D. []  Heide Guile, Pharm.D., BCPS AQ-ID []  Parks Neptune, Pharm.D., BCPS [x]  Alycia Rossetti, Pharm.D., BCPS []  [place], [place].D., BCPS, AAHIVP []  Legrand Como, Pharm.D., BCPS, AAHIVP []  Salome Arnt, PharmD, BCPS []  Dimitri Ped, PharmD, BCPS []  Vincenza Hews, PharmD, BCPS  Positive urine culture Treated with cephalexin, organism sensitive to the same and no further patient follow-up is required at this time.  Hazle Nordmann 06/09/2017, 12:04 PM

## 2017-06-25 ENCOUNTER — Telehealth: Payer: Self-pay | Admitting: Family Medicine

## 2017-06-25 ENCOUNTER — Other Ambulatory Visit: Payer: Self-pay | Admitting: Family Medicine

## 2017-06-25 ENCOUNTER — Other Ambulatory Visit: Payer: Self-pay | Admitting: Emergency Medicine

## 2017-06-25 DIAGNOSIS — M4802 Spinal stenosis, cervical region: Secondary | ICD-10-CM

## 2017-06-25 MED ORDER — TRAMADOL HCL 50 MG PO TABS: ORAL_TABLET | ORAL | 2 refills | Status: DC

## 2017-06-25 NOTE — Telephone Encounter (Signed)
Initially contacted pharm regarding pt's prescription for ultram and was told that the original prescription was written May 17 with 2 refills which had been used, and that a new prescription was requireds; contacted Santiago Glad at Dr [facility] office, [facility] and notified her, she will pass this along to provider for refill; Pt notified and verbalizes understanding

## 2017-06-25 NOTE — Telephone Encounter (Signed)
Copied from [place]. Topic: Quick Communication - See Telephone Encounter >> Jun 25, 2017 12:18 PM Conception Chancy, NT wrote: CRM for notification. See Telephone encounter for:  06/25/17.   Pt is calling to get her tramadol refilled. States pharmacy has called but it is being denied and she wants to know why it is being denied.

## 2017-06-25 NOTE — Telephone Encounter (Signed)
Last filled on 10/23 She uses tramadol for her spinal stenosis dx per MRI Ok to refill  Will call in for her

## 2017-07-24 ENCOUNTER — Other Ambulatory Visit: Payer: Self-pay | Admitting: Family Medicine

## 2017-07-24 ENCOUNTER — Other Ambulatory Visit: Payer: Self-pay | Admitting: Internal Medicine

## 2017-07-24 DIAGNOSIS — M4802 Spinal stenosis, cervical region: Secondary | ICD-10-CM

## 2017-07-31 ENCOUNTER — Encounter: Payer: Self-pay | Admitting: Family Medicine

## 2017-07-31 ENCOUNTER — Ambulatory Visit (HOSPITAL_BASED_OUTPATIENT_CLINIC_OR_DEPARTMENT_OTHER)
Admission: RE | Admit: 2017-07-31 | Discharge: 2017-07-31 | Disposition: A | Discharge status: Home / Self Care | Payer: Medicare HMO | Source: Ambulatory Visit | Attending: Family Medicine | Admitting: Family Medicine

## 2017-07-31 ENCOUNTER — Ambulatory Visit (INDEPENDENT_AMBULATORY_CARE_PROVIDER_SITE_OTHER): Payer: Medicare HMO | Admitting: Family Medicine

## 2017-07-31 VITALS — BP 138/82 | HR 55 | Temp 98.1°F | Ht 63.0 in | Wt 135.8 lb

## 2017-07-31 DIAGNOSIS — Z634 Disappearance and death of family member: Secondary | ICD-10-CM | POA: Diagnosis not present

## 2017-07-31 DIAGNOSIS — E119 Type 2 diabetes mellitus without complications: Secondary | ICD-10-CM

## 2017-07-31 DIAGNOSIS — X58XXXA Exposure to other specified factors, initial encounter: Secondary | ICD-10-CM | POA: Insufficient documentation

## 2017-07-31 DIAGNOSIS — S2231XA Fracture of one rib, right side, initial encounter for closed fracture: Secondary | ICD-10-CM

## 2017-07-31 DIAGNOSIS — Z23 Encounter for immunization: Secondary | ICD-10-CM | POA: Diagnosis not present

## 2017-07-31 DIAGNOSIS — S20211A Contusion of right front wall of thorax, initial encounter: Secondary | ICD-10-CM

## 2017-07-31 DIAGNOSIS — R0781 Pleurodynia: Secondary | ICD-10-CM | POA: Diagnosis not present

## 2017-07-31 NOTE — Patient Instructions (Addendum)
You do have a non- displaced right rib fracture. This should heal up ok, but let me know if your pain is not gradually better over the next couple of weeks- Sooner if worse.    You can increase your tramadol to 1.5 pills 3x a day as needed for pain.  This may cause you to run out sooner than 30 days- please alert me when you need a refill.  Be cautious of sedation with this medication   We are so sorry to hear about the tragic loss of your grand-daughter. Please let me know if I can help you in any way

## 2017-07-31 NOTE — Progress Notes (Addendum)
[facility] [address] [place], [place] [address] [phone]  Date:  07/31/2017   Name:  Alexa [name]   DOB:  10/13/1929   MRN:  [id number]  PCP:  Darreld Mclean, MD    Chief Complaint: Fall   History of Present Illness:  Alexa [name] is a 84 y.o. very pleasant female patient who presents with the following:  History of DM, spinal stenosis, HTN, hyperlipidemia  Lab Results  Component Value Date   HGBA1C 7.4 (H) 02/25/2017   BP Readings from Last 3 Encounters:  07/31/17 138/82  06/06/17 (!) 174/98  04/09/17 (!) 152/92   Due for a foot exam, A1c Flu: due for this today She got a pneumonia shot last year- we think prevanr.  She should be caught up here Eye exam:  Today is Thursday.  This past Friday she got up to use the bathroom- she slipped on an area rug, fell and hit her right side on the toilet. She hurt her ribs but is not sure if she might have broken a rib She did hit her head on the wall- it seems to be ok however, no LOC and her head feels ok now No other injury noted from this fall  She has pain when she takes a deep breath or tries to lean over   Her 32 yo daughter died last year- and there has been another tragedy in the family- Her adult grand-daughter died in an MVA on 04-26-2023- she left behind 3 school age children Theadora and [name] are helping to take care of the children.  Having this responsibility seems to have galvanized [name] and she seems to be handling this loss better than expected  She is taking tramadol that helps with her back and neck pain - she takes about 3 per day, it is written for up to 4 daily However this is not enough to control her current rib pain as well  [facility]: she filled her tramadol at the end of December- #120   Patient Active Problem List   Diagnosis Date Noted  . Nervous 06/05/2015  . Cough variant asthma 12/29/2014  . Upper airway cough syndrome  12/29/2014  . Spinal stenosis in cervical region 12/13/2014  . Radiculopathy, lumbar region 12/13/2014  . PERONEAL NEUROPATHY 05/03/2008  . Malignant neoplasm of kidney excluding renal pelvis ([place]) 08/06/2007  . Controlled type 2 diabetes mellitus without complication, without long-term current use of insulin ([place]) 08/06/2007  . GERD 08/06/2007  . PULMONARY EMBOLISM, HX OF 08/06/2007  . Hyperlipidemia 09/03/2006  . Essential hypertension 09/03/2006  . IBS 09/03/2006    Past Medical History:  Diagnosis Date  . Asthma   . Chronic obstructive pulmonary disease ([place])   . Diverticulosis   . Diverticulosis of colon (without mention of hemorrhage)   . Esophageal reflux   . History of colonic polyps 06/05/05   hyperplastic  . Internal hemorrhoids   . Irritable bowel syndrome   . Ischemic colitis ([place])   . Malignant neoplasm of kidney and other and unspecified urinary organs    renal carcinoma, left nephrectomy in 2008-per patient.  . Other and unspecified hyperlipidemia   . Personal history of venous thrombosis and embolism    PTE after nephrectomy  . Spinal stenosis   . Type II or unspecified type diabetes mellitus without mention of complication, not stated as uncontrolled 02/05/2012   pt states it was in 2007  .  Unspecified essential hypertension     Past Surgical History:  Procedure Laterality Date  . BREAST BIOPSY    . COLONOSCOPY W/ POLYPECTOMY  2000  . COLONOSCOPY W/ POLYPECTOMY  07/2004   Hyperplastic polyps  . DILATION AND CURETTAGE OF UTERUS  03/2005   Uterine Polyps  . NEPHRECTOMY  2007   right,Dr Dahlstedt  . SEPTOPLASTY    . TONSILLECTOMY      Social History   Tobacco Use  . Smoking status: Never Smoker  . Smokeless tobacco: Never Used  Substance Use Topics  . Alcohol use: No  . Drug use: No    Family History  Problem Relation Age of Onset  . Stroke Mother        onset in 46s  . Diabetes Mother   . Cancer Mother        bladder; nephrectomy for  calculi/also uterine , TAH & BSO  . Aneurysm Mother        thoracic  . Depression Mother   . Stroke Brother   . Heart failure Brother   . Heart attack Other        maternal family history  . Heart failure Maternal Grandfather   . Lung cancer Father        smoked  . Heart failure Sister   . Depression Sister   . Alzheimer's disease Sister   . COPD Sister   . Aneurysm Brother        cns  . Depression Maternal Aunt        X2  . Bipolar disorder Sister   . Alcoholism Neg Hx     Allergies  Allergen Reactions  . Lisinopril     Cough D/Ced by Dr Melvyn Novas  . Codeine     nausea  . Verapamil     Pain in feet    Medication list has been reviewed and updated.  Current Outpatient Medications on File Prior to Visit  Medication Sig Dispense Refill  . albuterol (PROAIR HFA) 108 (90 Base) MCG/ACT inhaler Inhale 1-2 puffs into the lungs every 6 (six) hours as needed for wheezing or shortness of breath. 8.5 Inhaler 5  . Ascorbic Acid (VITAMIN C) POWD Take 1 packet by mouth 2 (two) times daily as needed (cold symptoms).    Marland Kitchen aspirin EC 81 MG tablet Take 162 mg by mouth daily.    . Benzalkonium Chloride (DIABETIC BASICS HEALTHY FOOT EX) Apply 1 application topically daily as needed (foot pain). OTC diabetic foot cream    . Blood Glucose Monitoring Suppl (TRUERESULT BLOOD GLUCOSE) W/DEVICE KIT Use to test blood sugar ICD 10 E11 9 1 each 0  . budesonide-formoterol (SYMBICORT) 80-4.5 MCG/ACT inhaler Inhale 2 puffs into the lungs 2 (two) times daily. 10.2 g 5  . buPROPion (WELLBUTRIN) 75 MG tablet Take 1 tablet (75 mg total) by mouth 2 (two) times daily. 180 tablet 3  . busPIRone (BUSPAR) 7.5 MG tablet Take 1 tablet (7.5 mg total) by mouth 2 (two) times daily. May increase to 2 pills daily 120 tablet 6  . CALCIUM PO Take 1 tablet by mouth daily.    . cephALEXin (KEFLEX) 500 MG capsule Take 1 capsule (500 mg total) 4 (four) times daily by mouth. 40 capsule 0  . Cholecalciferol (VITAMIN D PO) Take 1  tablet by mouth daily.    Marland Kitchen CRANBERRY PO Take 2 capsules by mouth 2 (two) times daily.     Marland Kitchen doxycycline (VIBRAMYCIN) 100 MG capsule Take 1 capsule (100  mg total) by mouth 2 (two) times daily. (Patient not taking: Reported on 06/06/2017) 20 capsule 0  . ESTRACE VAGINAL 0.1 MG/GM vaginal cream Apply locally every other night at bedtime  1  . fluticasone (FLONASE) 50 MCG/ACT nasal spray Place 2 sprays into both nostrils daily. (Patient not taking: Reported on 06/06/2017) 16 g 6  . gabapentin (NEURONTIN) 100 MG capsule Take 1 capsule (100 mg total) by mouth 4 (four) times daily. 120 capsule 1  . glucose blood (TRUETEST TEST) test strip Use to test blood sugar ICD 10 E11 9 100 each 3  . hydrOXYzine (ATARAX/VISTARIL) 25 MG tablet Take 1 tablet (25 mg total) every 8 (eight) hours as needed by mouth for anxiety or nausea. 10 tablet 0  . losartan (COZAAR) 50 MG tablet Take 1 tablet (50 mg total) by mouth daily. 90 tablet 3  . Multiple Vitamins-Minerals (EYE VITAMINS PO) Take by mouth.    . polyethylene glycol powder (GLYCOLAX/MIRALAX) powder Take 0.5 Containers by mouth daily as needed for mild constipation. Use as needed    . RA P COL-RITE 8.6-50 MG tablet take 1 tablet by mouth once daily (Patient not taking: Reported on 06/06/2017) 30 tablet 3  . ranitidine (ZANTAC) 150 MG capsule Take 1 capsule (150 mg total) by mouth 2 (two) times daily. 180 capsule 3  . traMADol (ULTRAM) 50 MG tablet take 1 tablet by mouth every 6 hours if needed for neck pain 120 tablet 2  . TRUEPLUS LANCETS 28G MISC Use to test blood sugar ICD 10 E11 9 100 each 3  . [DISCONTINUED] DULoxetine (CYMBALTA) 30 MG capsule Take 1 capsule (30 mg total) by mouth daily. 30 capsule 0   No current facility-administered medications on file prior to visit.     Review of Systems:  As per HPI- otherwise negative.   Physical Examination: Vitals:   07/31/17 1424  BP: 138/82  Pulse: (!) 55  Temp: 98.1 F (36.7 C)  SpO2: 94%   Vitals:    07/31/17 1424  Weight: 135 lb 12.8 oz (61.6 kg)   Body mass index is 24.06 kg/m. Ideal Body Weight:    GEN: WDWN, NAD, Non-toxic, A & O x 3 HEENT: Atraumatic, Normocephalic. Neck supple. No masses, No LAD.  Bilateral TM wnl, oropharynx normal.  PEERL,EOMI.   Ears and Nose: No external deformity. CV: RRR, No M/G/R. No JVD. No thrill. No extra heart sounds. PULM: CTA B, no wheezes, crackles, rhonchi. No retractions. No resp. distress. No accessory muscle use. ABD: S, NT, ND, +BS. No rebound. No HSM.  Tender over her ribs but her belly is ok EXTR: No c/c/e NEURO Normal gait but sitting in chair today as it is a long walk to my clinic and her ribs hurt PSYCH: Normally interactive. Conversant. Not depressed or anxious appearing.  Calm demeanor. Tender over the right lower lateral ribs Did foot exam today- some mild hammer toes but overall good   Dg Ribs Unilateral W/chest Right  Result Date: 07/31/2017 CLINICAL DATA:  Acute right chest and rib pain following fall 1 week ago. Initial encounter. EXAM: RIGHT RIBS AND CHEST - 3+ VIEW COMPARISON:  06/06/2017 and prior radiograph FINDINGS: The cardiomediastinal silhouette is unchanged. Mild peribronchial thickening again noted. There is no evidence of focal airspace disease, pulmonary edema, suspicious pulmonary nodule/mass, pleural effusion, or pneumothorax. An acute nondisplaced fracture of the anterolateral right eighth rib is noted. IMPRESSION: 1. Acute nondisplaced fracture of the right eighth rib. No pneumothorax or pleural effusion. 2.  No evidence of acute cardiopulmonary disease. Electronically Signed   By: Margarette Canada M.D.   On: 07/31/2017 15:06    Assessment and Plan: Closed fracture of one rib of right side, initial encounter  Rib contusion, right, initial encounter - Plan: DG Ribs Unilateral W/Chest Right  Controlled type 2 diabetes mellitus without complication, without long-term current use of insulin ([place]) - Plan: Hemoglobin  A1c  Bereavement  Need for influenza vaccination - Plan: Flu vaccine HIGH DOSE PF (Fluzone High dose)  Here today due to recent fall She does have a right rib fracture We expect this will heal well Her pain is not controlled- she can increase her tramadol to 75 mg per dose if needed Asked her to keep me posted on how she is doing See patient instructions for more details.     Signed Lamar Blinks, MD Creat clearance is 38  Received her A1c- a bit higher than goal for her age.  Letter to pt Results for orders placed or performed in visit on 07/31/17  Hemoglobin A1c  Result Value Ref Range   Hgb A1c MFr Bld 7.8 (H) 4.6 - 6.5 %

## 2017-07-31 NOTE — Progress Notes (Signed)
135.8  

## 2017-08-01 LAB — HEMOGLOBIN A1C: HEMOGLOBIN A1C: 7.8 % — AB (ref 4.6–6.5)

## 2017-08-21 ENCOUNTER — Other Ambulatory Visit: Payer: Self-pay | Admitting: Family Medicine

## 2017-08-21 DIAGNOSIS — R0981 Nasal congestion: Secondary | ICD-10-CM

## 2017-09-15 ENCOUNTER — Other Ambulatory Visit: Payer: Self-pay | Admitting: Family Medicine

## 2017-09-15 DIAGNOSIS — K219 Gastro-esophageal reflux disease without esophagitis: Secondary | ICD-10-CM

## 2017-09-19 ENCOUNTER — Other Ambulatory Visit: Payer: Self-pay | Admitting: Family Medicine

## 2017-09-19 DIAGNOSIS — M4802 Spinal stenosis, cervical region: Secondary | ICD-10-CM

## 2017-09-23 NOTE — Telephone Encounter (Signed)
rx sent

## 2017-09-23 NOTE — Telephone Encounter (Signed)
Pt wants a update on this. She said that she requested this on Friday. Please advise.

## 2017-09-23 NOTE — Telephone Encounter (Signed)
Forwarding to covering provider.

## 2017-09-23 NOTE — Telephone Encounter (Signed)
Pt is requesting refill on tramadol 50mg .

## 2017-10-21 NOTE — Progress Notes (Addendum)
[facility] [address], [place], [place] [address] [phone]  Date:  10/22/2017   Name:  Alexa [name]   DOB:  11/21/1929   MRN:  [id number]  PCP:  Alexa Mclean, MD    Chief Complaint: Fatigue (c/o weakness, and back and hip pain, trouble falling and staying alseep. )   History of Present Illness:  Alexa [name] is a 84 y.o. very pleasant female patient who presents with the following:  Here today with concern of illness History of cough variant asthma, DM, HTN  Lab Results  Component Value Date   HGBA1C 7.8 (H) 07/31/2017   She has not felt well for about 3 weeks or a month now, but did not really have sx she could put her finger on so she did not come in until now She has felt weak, she has noted urinary frequency and urgency. No hematuria or dysuria however.    She will feel hot at night,but has not measured a fever She may notice some chills Some sore throat and cough- she will gargle with peroxide and it gets better  BP Readings from Last 3 Encounters:  10/22/17 138/86  07/31/17 138/82  06/06/17 (!) 174/98   She also feels like her hips are hurting her Will have pain in her very lower back, and was so severe that she had a hard time walking for a few days recently. She is is somewhat improved now but still not good She has not fallen recently or had any other injury that she can recall   2 of their great granddaughters are with them today- they are 40 and 82 yo.  Alexa [name] and Alexa [name] help to take care of them since their mother (their own grand-daughter) was killed tragically in an MVA  Patient Active Problem List   Diagnosis Date Noted  . Nervous 06/05/2015  . Cough variant asthma 12/29/2014  . Upper airway cough syndrome 12/29/2014  . Spinal stenosis in cervical region 12/13/2014  . Radiculopathy, lumbar region 12/13/2014  . PERONEAL NEUROPATHY 05/03/2008  . Malignant neoplasm of kidney excluding  renal pelvis ([facility]) 08/06/2007  . Controlled type 2 diabetes mellitus without complication, without long-term current use of insulin ([place]) 08/06/2007  . GERD 08/06/2007  . PULMONARY EMBOLISM, HX OF 08/06/2007  . Hyperlipidemia 09/03/2006  . Essential hypertension 09/03/2006  . IBS 09/03/2006    Past Medical History:  Diagnosis Date  . Asthma   . Chronic obstructive pulmonary disease ([facility])   . Diverticulosis   . Diverticulosis of colon (without mention of hemorrhage)   . Esophageal reflux   . History of colonic polyps 06/05/05   hyperplastic  . Internal hemorrhoids   . Irritable bowel syndrome   . Ischemic colitis ([facility])   . Malignant neoplasm of kidney and other and unspecified urinary organs    renal carcinoma, left nephrectomy in 2008-per patient.  . Other and unspecified hyperlipidemia   . Personal history of venous thrombosis and embolism    PTE after nephrectomy  . Spinal stenosis   . Type II or unspecified type diabetes mellitus without mention of complication, not stated as uncontrolled 02/05/2012   pt states it was in 2007  . Unspecified essential hypertension     Past Surgical History:  Procedure Laterality Date  . BREAST BIOPSY    . COLONOSCOPY W/ POLYPECTOMY  2000  . COLONOSCOPY W/ POLYPECTOMY  07/2004   Hyperplastic polyps  .  DILATION AND CURETTAGE OF UTERUS  03/2005   Uterine Polyps  . NEPHRECTOMY  2007   right,Dr Dahlstedt  . SEPTOPLASTY    . TONSILLECTOMY      Social History   Tobacco Use  . Smoking status: Never Smoker  . Smokeless tobacco: Never Used  Substance Use Topics  . Alcohol use: No  . Drug use: No    Family History  Problem Relation Age of Onset  . Stroke Mother        onset in 69s  . Diabetes Mother   . Cancer Mother        bladder; nephrectomy for calculi/also uterine , TAH & BSO  . Aneurysm Mother        thoracic  . Depression Mother   . Stroke Brother   . Heart failure Brother   . Heart attack Other        maternal family  history  . Heart failure Maternal Grandfather   . Lung cancer Father        smoked  . Heart failure Sister   . Depression Sister   . Alzheimer's disease Sister   . COPD Sister   . Aneurysm Brother        cns  . Depression Maternal Aunt        X2  . Bipolar disorder Sister   . Alcoholism Neg Hx     Allergies  Allergen Reactions  . Lisinopril     Cough D/Ced by Dr Melvyn Novas  . Codeine     nausea  . Verapamil     Pain in feet    Medication list has been reviewed and updated.  Current Outpatient Medications on File Prior to Visit  Medication Sig Dispense Refill  . albuterol (PROAIR HFA) 108 (90 Base) MCG/ACT inhaler Inhale 1-2 puffs into the lungs every 6 (six) hours as needed for wheezing or shortness of breath. 8.5 Inhaler 5  . Ascorbic Acid (VITAMIN C) POWD Take 1 packet by mouth 2 (two) times daily as needed (cold symptoms).    Marland Kitchen aspirin EC 81 MG tablet Take 162 mg by mouth daily.    . Benzalkonium Chloride (DIABETIC BASICS HEALTHY FOOT EX) Apply 1 application topically daily as needed (foot pain). OTC diabetic foot cream    . Blood Glucose Monitoring Suppl (TRUERESULT BLOOD GLUCOSE) W/DEVICE KIT Use to test blood sugar ICD 10 E11 9 1 each 0  . budesonide-formoterol (SYMBICORT) 80-4.5 MCG/ACT inhaler Inhale 2 puffs into the lungs 2 (two) times daily. 10.2 g 5  . buPROPion (WELLBUTRIN) 75 MG tablet Take 1 tablet (75 mg total) by mouth 2 (two) times daily. 180 tablet 3  . busPIRone (BUSPAR) 7.5 MG tablet Take 1 tablet (7.5 mg total) by mouth 2 (two) times daily. May increase to 2 pills daily 120 tablet 6  . CALCIUM PO Take 1 tablet by mouth daily.    . Cholecalciferol (VITAMIN D PO) Take 1 tablet by mouth daily.    Marland Kitchen CRANBERRY PO Take 2 capsules by mouth 2 (two) times daily.     Marland Kitchen ESTRACE VAGINAL 0.1 MG/GM vaginal cream Apply locally every other night at bedtime  1  . fluticasone (FLONASE) 50 MCG/ACT nasal spray place 2 sprays into each nostril once daily 16 g 6  . gabapentin  (NEURONTIN) 100 MG capsule Take 1 capsule (100 mg total) by mouth 4 (four) times daily. 120 capsule 1  . glucose blood (TRUETEST TEST) test strip Use to test blood sugar ICD 10  E11 9 100 each 3  . hydrOXYzine (ATARAX/VISTARIL) 25 MG tablet Take 1 tablet (25 mg total) every 8 (eight) hours as needed by mouth for anxiety or nausea. 10 tablet 0  . losartan (COZAAR) 50 MG tablet Take 1 tablet (50 mg total) by mouth daily. 90 tablet 3  . Multiple Vitamins-Minerals (EYE VITAMINS PO) Take by mouth.    . polyethylene glycol powder (GLYCOLAX/MIRALAX) powder Take 0.5 Containers by mouth daily as needed for mild constipation. Use as needed    . RA P COL-RITE 8.6-50 MG tablet take 1 tablet by mouth once daily 30 tablet 3  . ranitidine (ZANTAC) 150 MG capsule take 1 capsule by mouth twice a day 180 capsule 3  . traMADol (ULTRAM) 50 MG tablet take 1 tablet by mouth every 6 hours if needed for NECK OR BACK PAIN 120 tablet 2  . TRUEPLUS LANCETS 28G MISC Use to test blood sugar ICD 10 E11 9 100 each 3  . [DISCONTINUED] DULoxetine (CYMBALTA) 30 MG capsule Take 1 capsule (30 mg total) by mouth daily. 30 capsule 0   No current facility-administered medications on file prior to visit.     Review of Systems:  As per HPI- otherwise negative.   Physical Examination: Vitals:   10/22/17 1128  BP: 138/86  Pulse: 78  Temp: 97.7 F (36.5 C)  SpO2: 97%   Vitals:   10/22/17 1128  Weight: 136 lb (61.7 kg)  Height: 5' 3" (1.6 m)   Body mass index is 24.09 kg/m. Ideal Body Weight: Weight in (lb) to have BMI = 25: 140.8  GEN: WDWN, NAD, Non-toxic, A & O x 3, normal weight,   HEENT: Atraumatic, Normocephalic. Neck supple. No masses, No LAD.  Bilateral TM wnl, oropharynx normal.  PEERL,EOMI.   Ears and Nose: No external deformity. CV: RRR, No M/G/R. No JVD. No thrill. No extra heart sounds. PULM: CTA B, no wheezes, crackles, rhonchi. No retractions. No resp. distress. No accessory muscle use. ABD: S, NT,  ND EXTR: No c/c/e NEURO sitting in a WC today as the walk to my office is long.  She is able to stand and walk however PSYCH: Normally interactive. Conversant. Not depressed or anxious appearing.  Calm demeanor.  She does not have pain or restricted ROM in either hip to flexion, rotation.  When she refers to her "hip" it seems she actually means her lowe back.  She does not have tenderness to palpation of her lower back, but notes that this is where she will notice the pain recently No CVA tenderness abd is benign  Results for orders placed or performed in visit on 10/22/17  CBC  Result Value Ref Range   WBC 8.5 4.0 - 10.5 K/uL   RBC 5.08 3.87 - 5.11 Mil/uL   Platelets 336.0 150.0 - 400.0 K/uL   Hemoglobin 15.2 (H) 12.0 - 15.0 g/dL   HCT 45.2 36.0 - 46.0 %   MCV 88.8 78.0 - 100.0 fl   MCHC 33.7 30.0 - 36.0 g/dL   RDW 14.4 11.5 - 15.5 %  Comprehensive metabolic panel  Result Value Ref Range   Sodium 140 135 - 145 mEq/L   Potassium 5.1 3.5 - 5.1 mEq/L   Chloride 104 96 - 112 mEq/L   CO2 29 19 - 32 mEq/L   Glucose, Bld 250 (H) 70 - 99 mg/dL   BUN 24 (H) 6 - 23 mg/dL   Creatinine, Ser 0.98 0.40 - 1.20 mg/dL   Total Bilirubin 0.9  0.2 - 1.2 mg/dL   Alkaline Phosphatase 60 39 - 117 U/L   AST 15 0 - 37 U/L   ALT 20 0 - 35 U/L   Total Protein 7.3 6.0 - 8.3 g/dL   Albumin 3.8 3.5 - 5.2 g/dL   Calcium 9.7 8.4 - 10.5 mg/dL   GFR 57.20 (L) >60.00 mL/min  POCT Urinalysis Dipstick (Automated)  Result Value Ref Range   Color, UA yellow    Clarity, UA cloudy    Glucose, UA negative    Bilirubin, UA negative    Ketones, UA negative    Spec Grav, UA 1.020 1.010 - 1.025   Blood, UA negative    pH, UA 6.0 5.0 - 8.0   Protein, UA negative    Urobilinogen, UA 0.2 0.2 or 1.0 E.U./dL   Nitrite, UA positive    Leukocytes, UA Large (3+) (A) Negative   Dg Lumbar Spine Complete  Result Date: 10/22/2017 CLINICAL DATA:  Right-sided lower back pain for several months. EXAM: LUMBAR SPINE -  COMPLETE 4+ VIEW COMPARISON:  CT scan of February 05, 2012. FINDINGS: Mild S-shaped scoliosis of lumbar spine is noted. No fracture or spondylolisthesis is noted. Severe degenerative disc disease is noted at L1-2, L2-3, L3-4 and L4-5. IMPRESSION: Severe multilevel degenerative disc disease. No acute abnormality seen in the lumbar spine. Electronically Signed   By: Marijo Conception, M.D.   On: 10/22/2017 12:51   Dg Hips Bilat With Pelvis 3-4 Views  Result Date: 10/22/2017 CLINICAL DATA:  RIGHT-sided lower back pain.  Hip pain. EXAM: DG HIP (WITH OR WITHOUT PELVIS) 3-4V BILAT COMPARISON:  None. FINDINGS: There is no evidence of hip fracture or dislocation. There is no evidence of arthropathy or other focal bone abnormality. Degenerative change lumbar spine is incompletely evaluated. IMPRESSION: Negative for significant hip pathology. Electronically Signed   By: Staci Righter M.D.   On: 10/22/2017 12:59   Lab Results  Component Value Date   HGBA1C 7.8 (H) 07/31/2017    Assessment and Plan: Acute low back pain, unspecified back pain laterality, with sciatica presence unspecified - Plan: POCT Urinalysis Dipstick (Automated), DG Lumbar Spine Complete, CBC, Comprehensive metabolic panel  Urinary urgency - Plan: Urine Culture, CBC, Comprehensive metabolic panel  Acute cystitis without hematuria - Plan: cephALEXin (KEFLEX) 500 MG capsule  Bilateral hip pain - Plan: DG HIP UNILAT W OR W/O PELVIS 2-3 VIEWS LEFT, DG HIPS BILAT WITH PELVIS 3-4 VIEWS, CANCELED: DG HIP UNILAT W OR W/O PELVIS 2-3 VIEWS RIGHT  Here today not feeling well for a few weeks, she just feels achy and out of sorts It appears that she likely has a UTI, but did not know as her sx are not what she experienced when she was younger Will start treatment now with keflex while we await her culture Confirmed that she can pick up her tramadol for her chronic back pain and asked pharm to prepare refill  Her back aches are more than normal right  now, but we hope that this may be due to UTI and will get back to her baseline once treated  Counseled pt that in the future we should suspect UTI if she is not feeling well.  Gave her a couple of urine cups to have on hand in cas she needs to collect a home sample Await urine culture  Signed Lamar Blinks, MD  Called in the evening to go over her x-rays; significant degenerative change in her L spine, hips are ok  Will be in touch with her urine culture Called 3/29-  Received her urine culture- her current abx should be effective Called and LMOM with this info, asked her to call me if not well Results for orders placed or performed in visit on 10/22/17  Urine Culture  Result Value Ref Range   MICRO NUMBER: [id number]    SPECIMEN QUALITY: ADEQUATE    Sample Source URINE    STATUS: FINAL    ISOLATE 1: Escherichia coli (A)       Susceptibility   Escherichia coli - URINE CULTURE, REFLEX    AMOX/CLAVULANIC 8 Sensitive     AMPICILLIN >=32 Resistant     AMPICILLIN/SULBACTAM >=32 Resistant     CEFAZOLIN* <=4 Not Reportable      * For infections other than uncomplicated UTIcaused by E. coli, K. pneumoniae or P. mirabilis:Cefazolin is resistant if MIC > or = 8 mcg/mL.(Distinguishing susceptible versus intermediatefor isolates with MIC < or = 4 mcg/mL requiresadditional testing.)For uncomplicated UTI caused by E. coli,K. pneumoniae or P. mirabilis: Cefazolin issusceptible if MIC <32 mcg/mL and predictssusceptible to the oral agents cefaclor, cefdinir,cefpodoxime, cefprozil, cefuroxime, cephalexinand loracarbef.    CEFEPIME <=1 Sensitive     CEFTRIAXONE <=1 Sensitive     CIPROFLOXACIN >=4 Resistant     LEVOFLOXACIN >=8 Resistant     ERTAPENEM <=0.5 Sensitive     GENTAMICIN <=1 Sensitive     IMIPENEM <=0.25 Sensitive     NITROFURANTOIN <=16 Sensitive     PIP/TAZO <=4 Sensitive     TOBRAMYCIN <=1 Sensitive     TRIMETH/SULFA* >=320 Resistant      * For infections other than uncomplicated  UTIcaused by E. coli, K. pneumoniae or P. mirabilis:Cefazolin is resistant if MIC > or = 8 mcg/mL.(Distinguishing susceptible versus intermediatefor isolates with MIC < or = 4 mcg/mL requiresadditional testing.)For uncomplicated UTI caused by E. coli,K. pneumoniae or P. mirabilis: Cefazolin issusceptible if MIC <32 mcg/mL and predictssusceptible to the oral agents cefaclor, cefdinir,cefpodoxime, cefprozil, cefuroxime, cephalexinand loracarbef.Legend:S = Susceptible  I = IntermediateR = Resistant  NS = Not susceptible* = Not tested  NR = Not reported**NN = See antimicrobic comments  CBC  Result Value Ref Range   WBC 8.5 4.0 - 10.5 K/uL   RBC 5.08 3.87 - 5.11 Mil/uL   Platelets 336.0 150.0 - 400.0 K/uL   Hemoglobin 15.2 (H) 12.0 - 15.0 g/dL   HCT 45.2 36.0 - 46.0 %   MCV 88.8 78.0 - 100.0 fl   MCHC 33.7 30.0 - 36.0 g/dL   RDW 14.4 11.5 - 15.5 %  Comprehensive metabolic panel  Result Value Ref Range   Sodium 140 135 - 145 mEq/L   Potassium 5.1 3.5 - 5.1 mEq/L   Chloride 104 96 - 112 mEq/L   CO2 29 19 - 32 mEq/L   Glucose, Bld 250 (H) 70 - 99 mg/dL   BUN 24 (H) 6 - 23 mg/dL   Creatinine, Ser 0.98 0.40 - 1.20 mg/dL   Total Bilirubin 0.9 0.2 - 1.2 mg/dL   Alkaline Phosphatase 60 39 - 117 U/L   AST 15 0 - 37 U/L   ALT 20 0 - 35 U/L   Total Protein 7.3 6.0 - 8.3 g/dL   Albumin 3.8 3.5 - 5.2 g/dL   Calcium 9.7 8.4 - 10.5 mg/dL   GFR 57.20 (L) >60.00 mL/min  POCT Urinalysis Dipstick (Automated)  Result Value Ref Range   Color, UA yellow    Clarity, UA cloudy    Glucose,  UA negative    Bilirubin, UA negative    Ketones, UA negative    Spec Grav, UA 1.020 1.010 - 1.025   Blood, UA negative    pH, UA 6.0 5.0 - 8.0   Protein, UA negative    Urobilinogen, UA 0.2 0.2 or 1.0 E.U./dL   Nitrite, UA positive    Leukocytes, UA Large (3+) (A) Negative

## 2017-10-22 ENCOUNTER — Ambulatory Visit (HOSPITAL_BASED_OUTPATIENT_CLINIC_OR_DEPARTMENT_OTHER)
Admission: RE | Admit: 2017-10-22 | Discharge: 2017-10-22 | Disposition: A | Discharge status: Home / Self Care | Payer: Medicare HMO | Source: Ambulatory Visit | Attending: Family Medicine | Admitting: Family Medicine

## 2017-10-22 ENCOUNTER — Ambulatory Visit (INDEPENDENT_AMBULATORY_CARE_PROVIDER_SITE_OTHER): Payer: Medicare HMO | Admitting: Family Medicine

## 2017-10-22 ENCOUNTER — Encounter: Payer: Self-pay | Admitting: Family Medicine

## 2017-10-22 VITALS — BP 138/86 | HR 78 | Temp 97.7°F | Ht 63.0 in | Wt 136.0 lb

## 2017-10-22 DIAGNOSIS — N3000 Acute cystitis without hematuria: Secondary | ICD-10-CM

## 2017-10-22 DIAGNOSIS — M25552 Pain in left hip: Secondary | ICD-10-CM

## 2017-10-22 DIAGNOSIS — M5136 Other intervertebral disc degeneration, lumbar region: Secondary | ICD-10-CM | POA: Diagnosis not present

## 2017-10-22 DIAGNOSIS — M545 Low back pain: Secondary | ICD-10-CM

## 2017-10-22 DIAGNOSIS — M25551 Pain in right hip: Secondary | ICD-10-CM

## 2017-10-22 DIAGNOSIS — R3915 Urgency of urination: Secondary | ICD-10-CM | POA: Diagnosis not present

## 2017-10-22 LAB — CBC
HCT: 45.2 % (ref 36.0–46.0)
Hemoglobin: 15.2 g/dL — ABNORMAL HIGH (ref 12.0–15.0)
MCHC: 33.7 g/dL (ref 30.0–36.0)
MCV: 88.8 fl (ref 78.0–100.0)
PLATELETS: 336 10*3/uL (ref 150.0–400.0)
RBC: 5.08 Mil/uL (ref 3.87–5.11)
RDW: 14.4 % (ref 11.5–15.5)
WBC: 8.5 10*3/uL (ref 4.0–10.5)

## 2017-10-22 LAB — COMPREHENSIVE METABOLIC PANEL
ALT: 20 U/L (ref 0–35)
AST: 15 U/L (ref 0–37)
Albumin: 3.8 g/dL (ref 3.5–5.2)
Alkaline Phosphatase: 60 U/L (ref 39–117)
BUN: 24 mg/dL — ABNORMAL HIGH (ref 6–23)
CALCIUM: 9.7 mg/dL (ref 8.4–10.5)
CHLORIDE: 104 meq/L (ref 96–112)
CO2: 29 meq/L (ref 19–32)
CREATININE: 0.98 mg/dL (ref 0.40–1.20)
GFR: 57.2 mL/min — AB (ref >60.00)
GLUCOSE: 250 mg/dL — AB (ref 70–99)
Potassium: 5.1 mEq/L (ref 3.5–5.1)
Sodium: 140 mEq/L (ref 135–145)
Total Bilirubin: 0.9 mg/dL (ref 0.2–1.2)
Total Protein: 7.3 g/dL (ref 6.0–8.3)

## 2017-10-22 LAB — POC URINALSYSI DIPSTICK (AUTOMATED)
BILIRUBIN UA: NEGATIVE
Blood, UA: NEGATIVE
Glucose, UA: NEGATIVE
Ketones, UA: NEGATIVE
Nitrite, UA: POSITIVE
PH UA: 6 (ref 5.0–8.0)
PROTEIN UA: NEGATIVE
SPEC GRAV UA: 1.02 (ref 1.010–1.025)
Urobilinogen, UA: 0.2 E.U./dL

## 2017-10-22 MED ORDER — CEPHALEXIN 500 MG PO CAPS: 500.0000 mg | ORAL_CAPSULE | Freq: Two times a day (BID) | ORAL | 0 refills | Status: DC

## 2017-10-22 NOTE — Patient Instructions (Signed)
Good to see you today-  I do think that you havd a UTI which is likely causing you to be ill Please start on the antibiotic right away- take it twice daily for one week  The pharmacy will also refill your tramadol for pain  Please go to the ground floor for x-rays on the way out today,then you can head home I will be in touch with your urine culture results  Please let me know if you are not feeling improved in 1-2 days - Sooner if worse.

## 2017-10-24 ENCOUNTER — Encounter: Payer: Self-pay | Admitting: Family Medicine

## 2017-10-24 LAB — URINE CULTURE
MICRO NUMBER: 90382276
SPECIMEN QUALITY: ADEQUATE

## 2017-11-08 ENCOUNTER — Other Ambulatory Visit: Payer: Self-pay | Admitting: Family Medicine

## 2017-11-10 ENCOUNTER — Other Ambulatory Visit: Payer: Self-pay | Admitting: Family Medicine

## 2017-11-10 DIAGNOSIS — M4802 Spinal stenosis, cervical region: Secondary | ICD-10-CM

## 2017-11-12 ENCOUNTER — Other Ambulatory Visit: Payer: Self-pay | Admitting: Internal Medicine

## 2017-12-01 ENCOUNTER — Other Ambulatory Visit: Payer: Self-pay | Admitting: Emergency Medicine

## 2017-12-01 MED ORDER — FLUTICASONE PROPIONATE HFA 220 MCG/ACT IN AERO: 1.0000 | INHALATION_SPRAY | Freq: Two times a day (BID) | RESPIRATORY_TRACT | 12 refills | Status: DC

## 2017-12-01 NOTE — Telephone Encounter (Signed)
-----   Message from Emi Holes, [place] sent at 12/01/2017  1:03 PM EDT ----- Regarding: Flovent HFA 220 MCG oral inh Received prescription for Flovent HFA 220 MCG oral inh from [facility] on [address]. I do not see this on the patient's medication list. Please advise.

## 2017-12-09 ENCOUNTER — Other Ambulatory Visit: Payer: Self-pay | Admitting: Family Medicine

## 2017-12-09 DIAGNOSIS — M4802 Spinal stenosis, cervical region: Secondary | ICD-10-CM

## 2017-12-16 ENCOUNTER — Other Ambulatory Visit: Payer: Self-pay | Admitting: Internal Medicine

## 2017-12-16 ENCOUNTER — Ambulatory Visit (INDEPENDENT_AMBULATORY_CARE_PROVIDER_SITE_OTHER): Payer: Medicare HMO | Admitting: Family Medicine

## 2017-12-16 ENCOUNTER — Telehealth: Payer: Self-pay

## 2017-12-16 ENCOUNTER — Encounter: Payer: Self-pay | Admitting: Family Medicine

## 2017-12-16 VITALS — BP 140/86 | HR 85 | Temp 98.7°F | Ht 63.0 in | Wt 133.0 lb

## 2017-12-16 DIAGNOSIS — N3000 Acute cystitis without hematuria: Secondary | ICD-10-CM

## 2017-12-16 DIAGNOSIS — R070 Pain in throat: Secondary | ICD-10-CM | POA: Diagnosis not present

## 2017-12-16 DIAGNOSIS — N390 Urinary tract infection, site not specified: Secondary | ICD-10-CM | POA: Insufficient documentation

## 2017-12-16 DIAGNOSIS — M4802 Spinal stenosis, cervical region: Secondary | ICD-10-CM

## 2017-12-16 DIAGNOSIS — J449 Chronic obstructive pulmonary disease, unspecified: Secondary | ICD-10-CM | POA: Diagnosis not present

## 2017-12-16 DIAGNOSIS — R300 Dysuria: Secondary | ICD-10-CM

## 2017-12-16 DIAGNOSIS — J020 Streptococcal pharyngitis: Secondary | ICD-10-CM | POA: Diagnosis not present

## 2017-12-16 LAB — POCT URINALYSIS DIPSTICK
Bilirubin, UA: NEGATIVE
GLUCOSE UA: NEGATIVE
Ketones, UA: NEGATIVE
NITRITE UA: POSITIVE
Protein, UA: POSITIVE — AB
RBC UA: NEGATIVE
SPEC GRAV UA: 1.015 (ref 1.010–1.025)
UROBILINOGEN UA: 0.2 U/dL
pH, UA: 6 (ref 5.0–8.0)

## 2017-12-16 LAB — POCT RAPID STREP A (OFFICE): RAPID STREP A SCREEN: POSITIVE — AB

## 2017-12-16 MED ORDER — CEPHALEXIN 500 MG PO CAPS: 500.0000 mg | ORAL_CAPSULE | Freq: Two times a day (BID) | ORAL | 0 refills | Status: DC

## 2017-12-16 NOTE — Progress Notes (Signed)
Sent for C&S per TA/thx dmf

## 2017-12-16 NOTE — Assessment & Plan Note (Signed)
New- positive sick contact, rapid strep positive. Also has UTI- so will treat with Keflex 500 mg twice daily x 10 days to cover both. See below.

## 2017-12-16 NOTE — Assessment & Plan Note (Signed)
New- symptomatic with pos LE, nitrites on UA. Treat UTI ( and strep throat) with Keflex 500 mg twice daily x 10 days. Send urine for cx. The patient indicates understanding of these issues and agrees with the plan.

## 2017-12-16 NOTE — Patient Instructions (Signed)
Great to meet you.  Please take Keflex as directed- 1 tablet twice daily for 10 days to treat but your urinary tract infection and strep throat.   Strep Throat Strep throat is an infection of the throat. It is caused by germs. Strep throat spreads from person to person because of coughing, sneezing, or close contact. Follow these instructions at home: Medicines  Take over-the-counter and prescription medicines only as told by your doctor.  Take your antibiotic medicine as told by your doctor. Do not stop taking the medicine even if you feel better.  Have family members who also have a sore throat or fever go to a doctor. Eating and drinking  Do not share food, drinking cups, or personal items.  Try eating soft foods until your sore throat feels better.  Drink enough fluid to keep your pee (urine) clear or pale yellow. General instructions  Rinse your mouth (gargle) with a salt-water mixture 3-4 times per day or as needed. To make a salt-water mixture, stir -1 tsp of salt into 1 cup of warm water.  Make sure that all people in your house wash their hands well.  Rest.  Stay home from school or work until you have been taking antibiotics for 24 hours.  Keep all follow-up visits as told by your doctor. This is important. Contact a doctor if:  Your neck keeps getting bigger.  You get a rash, cough, or earache.  You cough up thick liquid that is green, yellow-brown, or bloody.  You have pain that does not get better with medicine.  Your problems get worse instead of getting better.  You have a fever. Get help right away if:  You throw up (vomit).  You get a very bad headache.  You neck hurts or it feels stiff.  You have chest pain or you are short of breath.  You have drooling, very bad throat pain, or changes in your voice.  Your neck is swollen or the skin gets red and tender.  Your mouth is dry or you are peeing less than normal.  You keep feeling more  tired or it is hard to wake up.  Your joints are red or they hurt. This information is not intended to replace advice given to you by your health care provider. Make sure you discuss any questions you have with your health care provider. Document Released: 01/01/2008 Document Revised: 03/13/2016 Document Reviewed: 11/07/2014 Elsevier Interactive Patient Education  [employer].

## 2017-12-16 NOTE — Telephone Encounter (Signed)
Refill request for Tramadol 

## 2017-12-16 NOTE — Telephone Encounter (Signed)
JC-Patient was seen by Dr. Deborra Medina today and Dx with strep and UTI which is being cultured/she is very unsteady/she is asking for a letter stating for her to be able to get a roller walker with seat so she may have insurance help with the cost of it/plz advise/thx dmf

## 2017-12-16 NOTE — Progress Notes (Signed)
Subjective:   Patient ID: Alexa [name], female    DOB: [birth year], 84 y.o.   MRN: [id number]  Alexa [name] is a pleasant 84 y.o. year old female who presents to clinic today with Sore Throat (Patient is here today C/O throat pain. States that Sx started 3-days-ago. Some post-nasal drip.  Has an intermittently productive cough with white sputum. Only able to get stuff up with a coughing fit that nearly makes her throw-up.  ) and Dysuria (Patient also is C/O dyuria, frequency, and urgency for about 5d now.  She only has one kidney.  She is unsteady while walking.)  on 12/16/2017  HPI:   Patient is new to our practice- Dr. Lorelei Pont patient.  Here for two issues-  1.  Sore throat- started with throat pain 3 days ago.  Now has some post nasal drip, an intermittent productive cough with white sputum but she thinks this isn't related to her sore throat. Has been taking her of 88 year old grand daughter who has been sick. H/o COPD.  2.  Increased urinary frequency- past few days . She is unsure if she has dysuria but when asked she thinks perhaps she does.  Current Outpatient Medications on File Prior to Visit  Medication Sig Dispense Refill  . albuterol (PROAIR HFA) 108 (90 Base) MCG/ACT inhaler Inhale 1-2 puffs into the lungs every 6 (six) hours as needed for wheezing or shortness of breath. 8.5 Inhaler 5  . Ascorbic Acid (VITAMIN C) POWD Take 1 packet by mouth 2 (two) times daily as needed (cold symptoms).    Marland Kitchen aspirin EC 81 MG tablet Take 162 mg by mouth daily.    . Benzalkonium Chloride (DIABETIC BASICS HEALTHY FOOT EX) Apply 1 application topically daily as needed (foot pain). OTC diabetic foot cream    . Blood Glucose Monitoring Suppl (TRUERESULT BLOOD GLUCOSE) W/DEVICE KIT Use to test blood sugar ICD 10 E11 9 1 each 0  . budesonide-formoterol (SYMBICORT) 80-4.5 MCG/ACT inhaler Inhale 2 puffs into the lungs 2 (two) times daily. 10.2 g 5  . buPROPion (WELLBUTRIN) 75 MG tablet Take 1 tablet  (75 mg total) by mouth 2 (two) times daily. 180 tablet 3  . busPIRone (BUSPAR) 7.5 MG tablet Take 1 tablet (7.5 mg total) by mouth 2 (two) times daily. May increase to 2 pills daily 120 tablet 6  . CALCIUM PO Take 1 tablet by mouth daily.    . Cholecalciferol (VITAMIN D PO) Take 1 tablet by mouth daily.    Marland Kitchen CRANBERRY PO Take 2 capsules by mouth 2 (two) times daily.     Marland Kitchen ESTRACE VAGINAL 0.1 MG/GM vaginal cream Apply locally every other night at bedtime  1  . fluticasone (FLONASE) 50 MCG/ACT nasal spray place 2 sprays into each nostril once daily 16 g 6  . gabapentin (NEURONTIN) 100 MG capsule TAKE 1 CAPSULE BY MOUTH FOUR TIMES A DAY 120 capsule 4  . glucose blood (TRUETEST TEST) test strip Use to test blood sugar ICD 10 E11 9 100 each 3  . hydrOXYzine (ATARAX/VISTARIL) 25 MG tablet Take 1 tablet (25 mg total) every 8 (eight) hours as needed by mouth for anxiety or nausea. 10 tablet 0  . losartan (COZAAR) 50 MG tablet Take 1 tablet (50 mg total) by mouth daily. 90 tablet 3  . Multiple Vitamins-Minerals (EYE VITAMINS PO) Take by mouth.    . polyethylene glycol powder (GLYCOLAX/MIRALAX) powder Take 0.5 Containers by mouth daily as needed for mild constipation.  Use as needed    . ranitidine (ZANTAC) 150 MG capsule take 1 capsule by mouth twice a day 180 capsule 3  . traMADol (ULTRAM) 50 MG tablet take 1 tablet by mouth every 6 hours if needed for NECK OR BACK PAIN 120 tablet 2  . TRUEPLUS LANCETS 28G MISC Use to test blood sugar ICD 10 E11 9 100 each 3  . [DISCONTINUED] DULoxetine (CYMBALTA) 30 MG capsule Take 1 capsule (30 mg total) by mouth daily. 30 capsule 0   No current facility-administered medications on file prior to visit.     Allergies  Allergen Reactions  . Lisinopril     Cough D/Ced by Dr Melvyn Novas  . Codeine     nausea  . Verapamil     Pain in feet    Past Medical History:  Diagnosis Date  . Asthma   . Chronic obstructive pulmonary disease ([place])   . Diverticulosis   .  Diverticulosis of colon (without mention of hemorrhage)   . Esophageal reflux   . History of colonic polyps 06/05/05   hyperplastic  . Internal hemorrhoids   . Irritable bowel syndrome   . Ischemic colitis ([place])   . Malignant neoplasm of kidney and other and unspecified urinary organs    renal carcinoma, left nephrectomy in 2008-per patient.  . Other and unspecified hyperlipidemia   . Personal history of venous thrombosis and embolism    PTE after nephrectomy  . Spinal stenosis   . Type II or unspecified type diabetes mellitus without mention of complication, not stated as uncontrolled 02/05/2012   pt states it was in 2007  . Unspecified essential hypertension     Past Surgical History:  Procedure Laterality Date  . BREAST BIOPSY    . COLONOSCOPY W/ POLYPECTOMY  2000  . COLONOSCOPY W/ POLYPECTOMY  07/2004   Hyperplastic polyps  . DILATION AND CURETTAGE OF UTERUS  03/2005   Uterine Polyps  . NEPHRECTOMY  2007   right,Dr Dahlstedt  . SEPTOPLASTY    . TONSILLECTOMY      Family History  Problem Relation Age of Onset  . Stroke Mother        onset in 66s  . Diabetes Mother   . Cancer Mother        bladder; nephrectomy for calculi/also uterine , TAH & BSO  . Aneurysm Mother        thoracic  . Depression Mother   . Stroke Brother   . Heart failure Brother   . Heart attack Other        maternal family history  . Heart failure Maternal Grandfather   . Lung cancer Father        smoked  . Heart failure Sister   . Depression Sister   . Alzheimer's disease Sister   . COPD Sister   . Aneurysm Brother        cns  . Depression Maternal Aunt        X2  . Bipolar disorder Sister   . Alcoholism Neg Hx     Social History   Socioeconomic History  . Marital status: Married    Spouse name: Not on file  . Number of children: Not on file  . Years of education: Not on file  . Highest education level: Not on file  Occupational History  . Not on file  Social Needs  .  Financial resource strain: Not on file  . Food insecurity:    Worry: Not on file  Inability: Not on file  . Transportation needs:    Medical: Not on file    Non-medical: Not on file  Tobacco Use  . Smoking status: Never Smoker  . Smokeless tobacco: Never Used  Substance and Sexual Activity  . Alcohol use: No  . Drug use: No  . Sexual activity: Not on file  Lifestyle  . Physical activity:    Days per week: Not on file    Minutes per session: Not on file  . Stress: Not on file  Relationships  . Social connections:    Talks on phone: Not on file    Gets together: Not on file    Attends religious service: Not on file    Active member of club or organization: Not on file    Attends meetings of clubs or organizations: Not on file    Relationship status: Not on file  . Intimate partner violence:    Fear of current or ex partner: Not on file    Emotionally abused: Not on file    Physically abused: Not on file    Forced sexual activity: Not on file  Other Topics Concern  . Not on file  Social History Narrative  . Not on file   The PMH, PSH, Social History, Family History, Medications, and allergies have been reviewed in [facility], and have been updated if relevant.   Review of Systems  Constitutional: Negative.   HENT: Positive for sore throat. Negative for congestion, rhinorrhea, sinus pressure and sinus pain.   Respiratory: Positive for cough.   Cardiovascular: Negative.   Gastrointestinal: Negative.   Genitourinary: Positive for dysuria and urgency.  Musculoskeletal: Negative.   Allergic/Immunologic: Negative.   Neurological: Negative.   Hematological: Negative.   Psychiatric/Behavioral: Negative.   All other systems reviewed and are negative.      Objective:    BP 140/86 (BP Location: Left Arm, Patient Position: Sitting, Cuff Size: Normal)   Pulse 85   Temp 98.7 F (37.1 C) (Oral)   Ht 5' 3"  (1.6 m)   Wt 133 lb (60.3 kg)   SpO2 95%   BMI 23.56 kg/m     Physical Exam  Constitutional: She is oriented to person, place, and time. She appears well-developed and well-nourished.  HENT:  Head: Normocephalic and atraumatic.  Mouth/Throat: Mucous membranes are normal. Posterior oropharyngeal erythema present.  Cardiovascular: Normal rate and regular rhythm.  Pulmonary/Chest: Effort normal and breath sounds normal.  Abdominal: Soft. Bowel sounds are normal. There is no tenderness.  Musculoskeletal:  No CVA tenderness  Neurological: She is alert and oriented to person, place, and time.  Skin: Skin is warm and dry.  Psychiatric: She has a normal mood and affect. Her behavior is normal.  Nursing note and vitals reviewed.         Assessment & Plan:   Throat pain - Plan: POCT rapid strep A  Dysuria - Plan: POCT urinalysis dipstick  Strep throat No follow-ups on file.

## 2017-12-17 NOTE — Telephone Encounter (Signed)
Letter completed and will mail to pt  lmom for pt with this info

## 2017-12-18 LAB — URINE CULTURE
MICRO NUMBER: 90616548
SPECIMEN QUALITY: ADEQUATE

## 2017-12-24 ENCOUNTER — Telehealth: Payer: Self-pay

## 2017-12-24 NOTE — Telephone Encounter (Signed)
I called alternate number and LMOVM stating that the results are in and she is on the correct antibiotic to treat her infection and Dr. Deborra Medina would like to know how she is feeling/PEC ok to give these results from urine Culture and find out how she is doing/thx dmf

## 2018-01-18 ENCOUNTER — Other Ambulatory Visit: Payer: Self-pay | Admitting: Family Medicine

## 2018-01-18 DIAGNOSIS — M4802 Spinal stenosis, cervical region: Secondary | ICD-10-CM

## 2018-01-20 NOTE — Telephone Encounter (Signed)
Requesting:Tramadol Contract:[facility]SC 2014 VQO:HCOB, needs uds Last Visit:10/22/17 Next Visit:none Last Refill:12/16/17  Please Advise

## 2018-02-24 ENCOUNTER — Other Ambulatory Visit: Payer: Self-pay | Admitting: Family Medicine

## 2018-02-24 DIAGNOSIS — R0981 Nasal congestion: Secondary | ICD-10-CM

## 2018-03-23 ENCOUNTER — Other Ambulatory Visit: Payer: Self-pay | Admitting: Family Medicine

## 2018-04-03 ENCOUNTER — Encounter: Payer: Self-pay | Admitting: Internal Medicine

## 2018-04-03 ENCOUNTER — Ambulatory Visit (INDEPENDENT_AMBULATORY_CARE_PROVIDER_SITE_OTHER): Payer: Medicare HMO | Admitting: Internal Medicine

## 2018-04-03 VITALS — BP 142/88 | HR 97 | Temp 98.4°F | Ht 63.0 in | Wt 132.0 lb

## 2018-04-03 DIAGNOSIS — M2550 Pain in unspecified joint: Secondary | ICD-10-CM | POA: Diagnosis not present

## 2018-04-03 DIAGNOSIS — R0609 Other forms of dyspnea: Secondary | ICD-10-CM | POA: Diagnosis not present

## 2018-04-03 DIAGNOSIS — F32A Depression, unspecified: Secondary | ICD-10-CM

## 2018-04-03 DIAGNOSIS — Z636 Dependent relative needing care at home: Secondary | ICD-10-CM

## 2018-04-03 DIAGNOSIS — F329 Major depressive disorder, single episode, unspecified: Secondary | ICD-10-CM | POA: Diagnosis not present

## 2018-04-03 DIAGNOSIS — R5383 Other fatigue: Secondary | ICD-10-CM | POA: Diagnosis not present

## 2018-04-03 DIAGNOSIS — E119 Type 2 diabetes mellitus without complications: Secondary | ICD-10-CM

## 2018-04-03 MED ORDER — BUPROPION HCL 100 MG PO TABS: 100.0000 mg | ORAL_TABLET | Freq: Two times a day (BID) | ORAL | 2 refills | Status: DC

## 2018-04-03 NOTE — Progress Notes (Signed)
Subjective:    Patient ID: Alexa [name], female    DOB: 11-01-1929, 84 y.o.   MRN: [id number]  DOS:  04/03/2018 Type of visit - description : Acute visit Interval history: Her main concern today is weakness, likes  some blood work done. Reports that every time she does something she feels exhausted, for instance after she does yard work she feels really tired. She continue taking care of her 3 great-grandchildren and reports that is becoming "too much", I asked if she feels overwhelmed and she said yes. When she is active denies chest pain but admits to some DOE. She has widespread DJD and aches and pains are slightly worse lately.  Wt Readings from Last 3 Encounters:  04/03/18 132 lb (59.9 kg)  12/16/17 133 lb (60.3 kg)  10/22/17 136 lb (61.7 kg)     Review of Systems  Denies fever chills or headaches. Appetite is okay Has noted modest weight loss. No unusual visual disturbances. Her mood is most days okay but occasionally feels sad.  No suicidal ideas.  Past Medical History:  Diagnosis Date  . Asthma   . Chronic obstructive pulmonary disease ([place])   . Diverticulosis   . Diverticulosis of colon (without mention of hemorrhage)   . Esophageal reflux   . History of colonic polyps 06/05/05   hyperplastic  . Internal hemorrhoids   . Irritable bowel syndrome   . Ischemic colitis ([place])   . Malignant neoplasm of kidney and other and unspecified urinary organs    renal carcinoma, left nephrectomy in 2008-per patient.  . Other and unspecified hyperlipidemia   . Personal history of venous thrombosis and embolism    PTE after nephrectomy  . Spinal stenosis   . Type II or unspecified type diabetes mellitus without mention of complication, not stated as uncontrolled 02/05/2012   pt states it was in 2007  . Unspecified essential hypertension     Past Surgical History:  Procedure Laterality Date  . BREAST BIOPSY    . COLONOSCOPY W/ POLYPECTOMY  2000  . COLONOSCOPY W/  POLYPECTOMY  07/2004   Hyperplastic polyps  . DILATION AND CURETTAGE OF UTERUS  03/2005   Uterine Polyps  . NEPHRECTOMY  2007   right,Dr Dahlstedt  . SEPTOPLASTY    . TONSILLECTOMY      Social History   Socioeconomic History  . Marital status: Married    Spouse name: Not on file  . Number of children: Not on file  . Years of education: Not on file  . Highest education level: Not on file  Occupational History  . Not on file  Social Needs  . Financial resource strain: Not on file  . Food insecurity:    Worry: Not on file    Inability: Not on file  . Transportation needs:    Medical: Not on file    Non-medical: Not on file  Tobacco Use  . Smoking status: Never Smoker  . Smokeless tobacco: Never Used  Substance and Sexual Activity  . Alcohol use: No  . Drug use: No  . Sexual activity: Not Currently  Lifestyle  . Physical activity:    Days per week: Not on file    Minutes per session: Not on file  . Stress: Not on file  Relationships  . Social connections:    Talks on phone: Not on file    Gets together: Not on file    Attends religious service: Not on file    Active member of  club or organization: Not on file    Attends meetings of clubs or organizations: Not on file    Relationship status: Not on file  . Intimate partner violence:    Fear of current or ex partner: Not on file    Emotionally abused: Not on file    Physically abused: Not on file    Forced sexual activity: Not on file  Other Topics Concern  . Not on file  Social History Narrative  . Not on file      Allergies as of 04/03/2018      Reactions   Lisinopril    Cough D/Ced by Dr Melvyn Novas   Codeine    nausea   Verapamil    Pain in feet      Medication List        Accurate as of 04/03/18 11:59 PM. Always use your most recent med list.          albuterol 108 (90 Base) MCG/ACT inhaler Commonly known as:  PROVENTIL HFA;VENTOLIN HFA Inhale 1-2 puffs into the lungs every 6 (six) hours as needed  for wheezing or shortness of breath.   aspirin EC 81 MG tablet Take 162 mg by mouth daily.   buPROPion 100 MG tablet Commonly known as:  WELLBUTRIN Take 1 tablet (100 mg total) by mouth 2 (two) times daily.   busPIRone 7.5 MG tablet Commonly known as:  BUSPAR Take 1 tablet (7.5 mg total) by mouth 2 (two) times daily. May increase to 2 pills daily   CALCIUM PO Take 1 tablet by mouth daily.   CRANBERRY PO Take 2 capsules by mouth 2 (two) times daily.   DIABETIC BASICS HEALTHY FOOT EX Apply 1 application topically daily as needed (foot pain). OTC diabetic foot cream   ESTRACE VAGINAL 0.1 MG/GM vaginal cream Generic drug:  estradiol Apply locally every other night at bedtime   EYE VITAMINS PO Take by mouth.   fluticasone 50 MCG/ACT nasal spray Commonly known as:  FLONASE INSTILL 2 SPRAYS INTO EACH NOSTRIL ONCE DAILY   gabapentin 100 MG capsule Commonly known as:  NEURONTIN TAKE 1 CAPSULE BY MOUTH FOUR TIMES A DAY   glucose blood test strip Use to test blood sugar ICD 10 E11 9   hydrOXYzine 25 MG tablet Commonly known as:  ATARAX/VISTARIL Take 1 tablet (25 mg total) every 8 (eight) hours as needed by mouth for anxiety or nausea.   losartan 50 MG tablet Commonly known as:  COZAAR Take 1 tablet (50 mg total) by mouth daily.   polyethylene glycol powder powder Commonly known as:  GLYCOLAX/MIRALAX Take 0.5 Containers by mouth daily as needed for mild constipation. Use as needed   ranitidine 150 MG capsule Commonly known as:  ZANTAC take 1 capsule by mouth twice a day   SYMBICORT 80-4.5 MCG/ACT inhaler Generic drug:  budesonide-formoterol INHALE 2 PUFFS BY MOUTH TWICE A DAY   traMADol 50 MG tablet Commonly known as:  ULTRAM TAKE 1 TABLET BY MOUTH NECK EVERY 6 HOURS AS NEEDED FOR BACK PAIN   TRUEPLUS LANCETS 28G Misc Use to test blood sugar ICD 10 E11 9   TRUERESULT BLOOD GLUCOSE w/Device Kit Use to test blood sugar ICD 10 E11 9   Vitamin C Powd Take 1  packet by mouth 2 (two) times daily as needed (cold symptoms).   VITAMIN D PO Take 1 tablet by mouth daily.          Objective:   Physical Exam BP (!) 142/88 (  BP Location: Left Arm, Patient Position: Sitting, Cuff Size: Normal)   Pulse 97   Temp 98.4 F (36.9 C) (Oral)   Ht 5' 3"  (1.6 m)   Wt 132 lb (59.9 kg)   SpO2 98%   BMI 23.38 kg/m  General:   Well developed, NAD, see BMI.  HEENT:  Normocephalic . Face symmetric, atraumatic Lungs:  Slightly decreased breath sounds but otherwise clear Normal respiratory effort, no intercostal retractions, no accessory muscle use. Heart: RRR,  no murmur.  no pretibial edema bilaterally Normal femoral and pedal pulses bilaterally Abdomen:  Not distended, soft, non-tender. No rebound or rigidity.  No mass, no bruit. Skin: Not pale. Not jaundice Neurologic:  alert & oriented X3.  Speech normal, gait appropriate for age and unassisted Psych--  Cognition and judgment appear intact.  Cooperative with normal attention span and concentration.  Behavior appropriate. Tearful at times during the visit    Assessment & Plan:     84 year old patient with history of DM, HTN, COPD, depression, asthma, GERD, ischemic colitis, renal carcinoma, hyperlipidemia, history of PE,  on HRT presents with the following  Fatigue, doe As described above, I saw her last year with similar sxs; echo and PFTs were essentially unremarkable. Labs reviewed, will recheck BMP, LFTs, CBC, A1c.  Last B12 normal, check a B1 vitamin D. Symptoms likely multifactorial, see next Depression: Patient takes care of 3 great grand children, she reports that that is becoming too much and she feels overwhelmed.  Rec  to go back to see a counselor; also warned about "provider burnout", consider take more time to rest and for herself . Currently taking BuSpar, Wellbutrin 75 mg twice a day.  We agreed to increase Wellbutrin to 100 mg twice a day.  Prescription sent. DM: labs DJD:  Aches and pains increase lately, reports backache with exertion, vascular exam normal.  She might need MRI and further evaluation, will defer to PCP.  Continue with tramadol, gabapentin (prescribed 3 years ago for neck pain) RTC PCP to 3 weeks

## 2018-04-03 NOTE — Patient Instructions (Signed)
GO TO THE LAB : Get the blood work     GO TO THE FRONT DESK Schedule your next appointment for a checkup with your primary doctor in 2 to 3 weeks.   Please see a counselor

## 2018-04-03 NOTE — Progress Notes (Signed)
Pre visit review using our clinic review tool, if applicable. No additional management support is needed unless otherwise documented below in the visit note. 

## 2018-04-08 LAB — VITAMIN D 1,25 DIHYDROXY
VITAMIN D 1, 25 (OH) TOTAL: 35 pg/mL (ref 18–72)
VITAMIN D3 1, 25 (OH): 35 pg/mL
Vitamin D2 1, 25 (OH)2: <8 pg/mL

## 2018-04-08 LAB — CBC WITH DIFFERENTIAL/PLATELET
BASOS ABS: 158 {cells}/uL (ref 0–200)
Basophils Relative: 1.6 %
Eosinophils Absolute: 594 cells/uL — ABNORMAL HIGH (ref 15–500)
Eosinophils Relative: 6 %
HCT: 42.4 % (ref 35.0–45.0)
Hemoglobin: 14.5 g/dL (ref 11.7–15.5)
Lymphs Abs: 3138 cells/uL (ref 850–3900)
MCH: 29.3 pg (ref 27.0–33.0)
MCHC: 34.2 g/dL (ref 32.0–36.0)
MCV: 85.7 fL (ref 80.0–100.0)
MPV: 9.5 fL (ref 7.5–12.5)
Monocytes Relative: 7.1 %
NEUTROS PCT: 53.6 %
Neutro Abs: 5306 cells/uL (ref 1500–7800)
PLATELETS: 331 10*3/uL (ref 140–400)
RBC: 4.95 10*6/uL (ref 3.80–5.10)
RDW: 13.2 % (ref 11.0–15.0)
TOTAL LYMPHOCYTE: 31.7 %
WBC mixed population: 703 cells/uL (ref 200–950)
WBC: 9.9 10*3/uL (ref 3.8–10.8)

## 2018-04-08 LAB — COMPREHENSIVE METABOLIC PANEL
AG RATIO: 1.4 (calc) (ref 1.0–2.5)
ALKALINE PHOSPHATASE (APISO): 64 U/L (ref 33–130)
ALT: 17 U/L (ref 6–29)
AST: 20 U/L (ref 10–35)
Albumin: 4 g/dL (ref 3.6–5.1)
BILIRUBIN TOTAL: 0.5 mg/dL (ref 0.2–1.2)
BUN/Creatinine Ratio: 16 (calc) (ref 6–22)
BUN: 20 mg/dL (ref 7–25)
CALCIUM: 9.6 mg/dL (ref 8.6–10.4)
CHLORIDE: 104 mmol/L (ref 98–110)
CO2: 25 mmol/L (ref 20–32)
Creat: 1.24 mg/dL — ABNORMAL HIGH (ref 0.60–0.88)
GLOBULIN: 2.8 g/dL (ref 1.9–3.7)
Glucose, Bld: 113 mg/dL — ABNORMAL HIGH (ref 65–99)
Potassium: 4.4 mmol/L (ref 3.5–5.3)
Sodium: 140 mmol/L (ref 135–146)
Total Protein: 6.8 g/dL (ref 6.1–8.1)

## 2018-04-08 LAB — VITAMIN B1: VITAMIN B1 (THIAMINE): 14 nmol/L (ref 8–30)

## 2018-04-08 LAB — HEMOGLOBIN A1C
HEMOGLOBIN A1C: 7 %{Hb} — AB (ref <5.7)
MEAN PLASMA GLUCOSE: 154 (calc)
eAG (mmol/L): 8.5 (calc)

## 2018-04-14 ENCOUNTER — Telehealth: Payer: Self-pay | Admitting: Family Medicine

## 2018-04-14 NOTE — Telephone Encounter (Signed)
Pt given results per notes of Dr Larose Kells on 04/08/18. Unable to document in result note due to result note not being routed to [facility]. Appt made for 04/23/18 at 1100.

## 2018-04-15 NOTE — Telephone Encounter (Signed)
Alexa [name] called back stating that it was not made clear what exactly she needed to do pertaining to the lab results she was given. Alexa [name] says she is weak and would like to know if Dr. Lorelei Pont could call her to clarify. Please advise.

## 2018-04-16 NOTE — Telephone Encounter (Signed)
Tried calling patient, person who answered stated patient was not home at the moment. She will return call.

## 2018-04-17 ENCOUNTER — Other Ambulatory Visit: Payer: Self-pay | Admitting: Family Medicine

## 2018-04-17 NOTE — Telephone Encounter (Signed)
Called and did speak to the patient---gave her the lab results from 04/03/18 per Dr. Larose Kells visit.  They had been released on mychart but can be seen in her chart she does not use mychart. I did verbally give her the results/instructions.  She verbalized understanding/had no further questions or concerns. She will keep her appointment on 04/23/2018 with Dr. Lorelei Pont

## 2018-04-19 NOTE — Progress Notes (Signed)
[facility] [address], [place], [place] [address] [phone]  Date:  04/23/2018   Name:  Alexa [name]   DOB:  06-Jun-1930   MRN:  [id number]  PCP:  Darreld Mclean, MD    Chief Complaint: Fatigue (felt like this for 1 year, thinking its Losartan no energy)   History of Present Illness:  Alexa [name] is a 84 y.o. very pleasant female patient who presents with the following:  History of DM, GERD, hyperlipidemia, cervical spinal stenosis, HTN, back pain Here today with concern of fatigue I last saw her in March, but she saw Dr. Larose Kells earlier this month also with fatigue: Her main concern today is weakness, likes  some blood work done. Reports that every time she does something she feels exhausted, for instance after she does yard work she feels really tired. She continue taking care of her 3 great-grandchildren and reports that is becoming "too much", I asked if she feels overwhelmed and she said yes. When she is active denies chest pain but admits to some DOE. She has widespread DJD and aches and pains are slightly worse lately.///////////////////////////// Fatigue, doe As described above, I saw her last year with similar sxs; echo and PFTs were essentially unremarkable. Labs reviewed, will recheck BMP, LFTs, CBC, A1c.  Last B12 normal, check a B1 vitamin D. Symptoms likely multifactorial, see next Depression: Patient takes care of 3 great grand children, she reports that that is becoming too much and she feels overwhelmed.  Rec  to go back to see a counselor; also warned about "provider burnout", consider take more time to rest and for herself . Currently taking BuSpar, Wellbutrin 75 mg twice a day.  We agreed to increase Wellbutrin to 100 mg twice a day.  Prescription sent. DM: labs DJD: Aches and pains increase lately, reports backache with exertion, vascular exam normal.  She might need MRI and further evaluation,  will defer to PCP.  Continue with tramadol, gabapentin (prescribed 3 years ago for neck pain) RTC PCP to 3 weeks  Per her recent labs-looked ok, renal function stable She has been on losartan for some time, she is on 25 mg right now.  She feels like this is the cause of her symptoms as she read the SE profile and "I have at least 10 of the side effects" She feels like she did fine on valsartan- we changed her from this when there was an issue with the supplier last year  Pt perseverates about not feeling good and not having any energy. She does not believe that any of this could be due to her age and the demands of taking care of 3 great grand children  She did not take her losartan this am - 25 mg  She was on valsartan 40 mg most recently History of cough with lisinopril but no other allergy Flu shot today Recent A1c showed fine control of her glucose    Lab Results  Component Value Date   HGBA1C 7.0 (H) 04/03/2018    Results for orders placed or performed in visit on 04/03/18  Comp Met (CMET)  Result Value Ref Range   Glucose, Bld 113 (H) 65 - 99 mg/dL   BUN 20 7 - 25 mg/dL   Creat 1.24 (H) 0.60 - 0.88 mg/dL   BUN/Creatinine Ratio 16 6 - 22 (calc)   Sodium 140 135 - 146 mmol/L   Potassium 4.4  3.5 - 5.3 mmol/L   Chloride 104 98 - 110 mmol/L   CO2 25 20 - 32 mmol/L   Calcium 9.6 8.6 - 10.4 mg/dL   Total Protein 6.8 6.1 - 8.1 g/dL   Albumin 4.0 3.6 - 5.1 g/dL   Globulin 2.8 1.9 - 3.7 g/dL (calc)   AG Ratio 1.4 1.0 - 2.5 (calc)   Total Bilirubin 0.5 0.2 - 1.2 mg/dL   Alkaline phosphatase (APISO) 64 33 - 130 U/L   AST 20 10 - 35 U/L   ALT 17 6 - 29 U/L  CBC w/Diff  Result Value Ref Range   WBC 9.9 3.8 - 10.8 Thousand/uL   RBC 4.95 3.80 - 5.10 Million/uL   Hemoglobin 14.5 11.7 - 15.5 g/dL   HCT 42.4 35.0 - 45.0 %   MCV 85.7 80.0 - 100.0 fL   MCH 29.3 27.0 - 33.0 pg   MCHC 34.2 32.0 - 36.0 g/dL   RDW 13.2 11.0 - 15.0 %   Platelets 331 140 - 400 Thousand/uL   MPV 9.5 7.5  - 12.5 fL   Neutro Abs 5,306 1,500 - 7,800 cells/uL   Lymphs Abs 3,138 850 - 3,900 cells/uL   WBC mixed population 703 200 - 950 cells/uL   Eosinophils Absolute 594 (H) 15 - 500 cells/uL   Basophils Absolute 158 0 - 200 cells/uL   Neutrophils Relative % 53.6 %   Total Lymphocyte 31.7 %   Monocytes Relative 7.1 %   Eosinophils Relative 6.0 %   Basophils Relative 1.6 %  Hemoglobin A1c  Result Value Ref Range   Hgb A1c MFr Bld 7.0 (H) <5.7 % of total Hgb   Mean Plasma Glucose 154 (calc)   eAG (mmol/L) 8.5 (calc)  Vitamin B1  Result Value Ref Range   Vitamin B1 (Thiamine) 14 8 - 30 nmol/L  Vitamin D 1,25 dihydroxy  Result Value Ref Range   Vitamin D 1, 25 (OH)2 Total 35 18 - 72 pg/mL   Vitamin D3 1, 25 (OH)2 35 pg/mL   Vitamin D2 1, 25 (OH)2 <8 pg/mL     Eye exam: reminded her to do this when she has time  Flu: do today    Patient Active Problem List   Diagnosis Date Noted  . UTI (urinary tract infection) 12/16/2017  . Cough variant asthma 12/29/2014  . Spinal stenosis in cervical region 12/13/2014  . Radiculopathy, lumbar region 12/13/2014  . Depression 05/03/2008  . PERONEAL NEUROPATHY 05/03/2008  . Malignant neoplasm of kidney excluding renal pelvis ([place] [facility]) 08/06/2007  . Controlled type 2 diabetes mellitus without complication, without long-term current use of insulin ([facility]) 08/06/2007  . GERD 08/06/2007  . PULMONARY EMBOLISM, HX OF 08/06/2007  . Hyperlipidemia 09/03/2006  . Essential hypertension 09/03/2006  . IBS 09/03/2006    Past Medical History:  Diagnosis Date  . Asthma   . Chronic obstructive pulmonary disease ([facility])   . Diverticulosis   . Diverticulosis of colon (without mention of hemorrhage)   . Esophageal reflux   . History of colonic polyps 06/05/05   hyperplastic  . Internal hemorrhoids   . Irritable bowel syndrome   . Ischemic colitis ([place])   . Malignant neoplasm of kidney and other and unspecified urinary organs    renal carcinoma, left  nephrectomy in 2008-per patient.  . Other and unspecified hyperlipidemia   . Personal history of venous thrombosis and embolism    PTE after nephrectomy  . Spinal stenosis   . Type II or unspecified  type diabetes mellitus without mention of complication, not stated as uncontrolled 02/05/2012   pt states it was in 2007  . Unspecified essential hypertension     Past Surgical History:  Procedure Laterality Date  . BREAST BIOPSY    . COLONOSCOPY W/ POLYPECTOMY  2000  . COLONOSCOPY W/ POLYPECTOMY  07/2004   Hyperplastic polyps  . DILATION AND CURETTAGE OF UTERUS  03/2005   Uterine Polyps  . NEPHRECTOMY  2007   right,Dr Dahlstedt  . SEPTOPLASTY    . TONSILLECTOMY      Social History   Tobacco Use  . Smoking status: Never Smoker  . Smokeless tobacco: Never Used  Substance Use Topics  . Alcohol use: No  . Drug use: No    Family History  Problem Relation Age of Onset  . Stroke Mother        onset in 11s  . Diabetes Mother   . Cancer Mother        bladder; nephrectomy for calculi/also uterine , TAH & BSO  . Aneurysm Mother        thoracic  . Depression Mother   . Stroke Brother   . Heart failure Brother   . Heart attack Other        maternal family history  . Heart failure Maternal Grandfather   . Lung cancer Father        smoked  . Heart failure Sister   . Depression Sister   . Alzheimer's disease Sister   . COPD Sister   . Aneurysm Brother        cns  . Depression Maternal Aunt        X2  . Bipolar disorder Sister   . Alcoholism Neg Hx     Allergies  Allergen Reactions  . Lisinopril     Cough D/Ced by Dr Melvyn Novas  . Codeine     nausea  . Verapamil     Pain in feet    Medication list has been reviewed and updated.  Current Outpatient Medications on File Prior to Visit  Medication Sig Dispense Refill  . albuterol (PROAIR HFA) 108 (90 Base) MCG/ACT inhaler Inhale 1-2 puffs into the lungs every 6 (six) hours as needed for wheezing or shortness of breath.  8.5 Inhaler 5  . Ascorbic Acid (VITAMIN C) POWD Take 1 packet by mouth 2 (two) times daily as needed (cold symptoms).    Marland Kitchen aspirin EC 81 MG tablet Take 162 mg by mouth daily.    . Benzalkonium Chloride (DIABETIC BASICS HEALTHY FOOT EX) Apply 1 application topically daily as needed (foot pain). OTC diabetic foot cream    . Blood Glucose Monitoring Suppl (TRUERESULT BLOOD GLUCOSE) W/DEVICE KIT Use to test blood sugar ICD 10 E11 9 1 each 0  . buPROPion (WELLBUTRIN) 100 MG tablet Take 1 tablet (100 mg total) by mouth 2 (two) times daily. 60 tablet 2  . busPIRone (BUSPAR) 7.5 MG tablet Take 1 tablet (7.5 mg total) by mouth 2 (two) times daily. May increase to 2 pills daily 120 tablet 6  . CALCIUM PO Take 1 tablet by mouth daily.    . Cholecalciferol (VITAMIN D PO) Take 1 tablet by mouth daily.    Marland Kitchen CRANBERRY PO Take 2 capsules by mouth 2 (two) times daily.     Marland Kitchen ESTRACE VAGINAL 0.1 MG/GM vaginal cream Apply locally every other night at bedtime  1  . fluticasone (FLONASE) 50 MCG/ACT nasal spray INSTILL 2 SPRAYS INTO EACH NOSTRIL ONCE  DAILY 48 g 2  . gabapentin (NEURONTIN) 100 MG capsule TAKE 1 CAPSULE BY MOUTH FOUR TIMES A DAY 120 capsule 4  . glucose blood (TRUETEST TEST) test strip Use to test blood sugar ICD 10 E11 9 100 each 3  . hydrOXYzine (ATARAX/VISTARIL) 25 MG tablet Take 1 tablet (25 mg total) every 8 (eight) hours as needed by mouth for anxiety or nausea. 10 tablet 0  . Multiple Vitamins-Minerals (EYE VITAMINS PO) Take by mouth.    . polyethylene glycol powder (GLYCOLAX/MIRALAX) powder Take 0.5 Containers by mouth daily as needed for mild constipation. Use as needed    . ranitidine (ZANTAC) 150 MG capsule take 1 capsule by mouth twice a day 180 capsule 3  . SYMBICORT 80-4.5 MCG/ACT inhaler INHALE 2 PUFFS BY MOUTH TWICE A DAY 10.2 g 1  . traMADol (ULTRAM) 50 MG tablet TAKE 1 TABLET BY MOUTH NECK EVERY 6 HOURS AS NEEDED FOR BACK PAIN 120 tablet 2  . TRUEPLUS LANCETS 28G MISC Use to test blood  sugar ICD 10 E11 9 100 each 3   No current facility-administered medications on file prior to visit.     Review of Systems:  As per HPI- otherwise negative. No fever or chills Did not take her BP med this am as she forgot She is able to monitor her BP at home Wt Readings from Last 3 Encounters:  04/23/18 129 lb (58.5 kg)  04/03/18 132 lb (59.9 kg)  12/16/17 133 lb (60.3 kg)   Weight is stable     Physical Examination: Vitals:   04/23/18 1053  BP: (!) 170/100  Pulse: 90  Resp: 16  Temp: 97.7 F (36.5 C)  SpO2: 97%   Vitals:   04/23/18 1053  Weight: 129 lb (58.5 kg)  Height: _0  (1.6 m)   Body mass index is 22.85 kg/m. Ideal Body Weight: Weight in (lb) to have BMI = 25: 140.8  GEN: WDWN, NAD, Non-toxic, A & O x 3, normal weight, looks well  HEENT: Atraumatic, Normocephalic. Neck supple. No masses, No LAD. Ears and Nose: No external deformity. CV: RRR, No M/G/R. No JVD. No thrill. No extra heart sounds. PULM: CTA B, no wheezes, crackles, rhonchi. No retractions. No resp. distress. No accessory muscle use. EXTR: No c/c/e NEURO Normal gait.  PSYCH: Normally interactive. Conversant. Not depressed or anxious appearing.  Calm demeanor.    Assessment and Plan: Controlled type 2 diabetes mellitus without complication, without long-term current use of insulin ([facility])  Caregiver stress  Immunization due - Plan: Flu vaccine HIGH DOSE PF (Fluzone High dose)  Essential hypertension - Plan: valsartan (DIOVAN) 40 MG tablet  Chronic fatigue  Recent A1c showed good control of DM  Flu shot given [facility] labs looked fine otherwise She is quite convinced that losartan is why she is feeling tired.  Will DC this med and change back to valsartan Plan to recheck in one month She will monitor her home BP and if significantly higher than 140/90 will alert me in the meantime   Signed Lamar Blinks, MD

## 2018-04-23 ENCOUNTER — Ambulatory Visit (INDEPENDENT_AMBULATORY_CARE_PROVIDER_SITE_OTHER): Payer: Medicare HMO | Admitting: Family Medicine

## 2018-04-23 ENCOUNTER — Encounter: Payer: Self-pay | Admitting: Family Medicine

## 2018-04-23 VITALS — BP 170/100 | HR 90 | Temp 97.7°F | Resp 16 | Ht 63.0 in | Wt 129.0 lb

## 2018-04-23 DIAGNOSIS — R5382 Chronic fatigue, unspecified: Secondary | ICD-10-CM | POA: Diagnosis not present

## 2018-04-23 DIAGNOSIS — Z636 Dependent relative needing care at home: Secondary | ICD-10-CM

## 2018-04-23 DIAGNOSIS — I10 Essential (primary) hypertension: Secondary | ICD-10-CM | POA: Diagnosis not present

## 2018-04-23 DIAGNOSIS — Z23 Encounter for immunization: Secondary | ICD-10-CM | POA: Diagnosis not present

## 2018-04-23 DIAGNOSIS — E119 Type 2 diabetes mellitus without complications: Secondary | ICD-10-CM | POA: Diagnosis not present

## 2018-04-23 MED ORDER — VALSARTAN 40 MG PO TABS: 40.0000 mg | ORAL_TABLET | Freq: Every day | ORAL | 3 refills | Status: DC

## 2018-04-23 NOTE — Patient Instructions (Addendum)
We will change you from losartan to valsartan for your blood pressure  I hope that you will feel better on this medication Please see me in one month for a BP check

## 2018-04-25 ENCOUNTER — Other Ambulatory Visit: Payer: Self-pay | Admitting: Family Medicine

## 2018-04-25 DIAGNOSIS — M4802 Spinal stenosis, cervical region: Secondary | ICD-10-CM

## 2018-04-27 NOTE — Telephone Encounter (Signed)
Pt seen just recently Went over [place]:  03/25/2018  1  01/20/2018  Tramadol Hcl 50 Mg Tablet  120.00 30 [address] Cop  [id number]  Wal ([id number])  2/2 20.00 MME Comm Ins  [place]  02/23/2018  1  01/20/2018  Tramadol Hcl 50 Mg Tablet  120.00 30 Je Cop  [id number]  Wal ([id number])  1/2 20.00 MME Comm Ins  [place]  01/20/2018  1  01/20/2018  Tramadol Hcl 50 Mg Tablet  120.00 30 Je Cop  [id number]  Wal ([id number])  0/2 20.00 MME Comm Ins  [place]  12/21/2017  1  12/16/2017  Tramadol Hcl 50 Mg Tablet  120.00 30 Je Cop  [id number]  Wal ([id number])  0/0 20.00 MME Comm Ins  [place]  11/26/2017  1  11/18/2017  Acetaminophen-Cod #3 Tablet  20.00 [address]  [id number]  Wal ([id number])  0/0 30.00 MME Comm Ins  [place]  11/21/2017  1  09/23/2017  Tramadol Hcl 50 Mg Tablet  120.00 [address]  [id number]  Wal [phone]  1/1 20.00 MME Comm Ins  [place]  10/22/2017  1  09/23/2017  Tramadol Hcl 50 Mg Tablet  120.00 [address]  [id number]  Wal [phone])  0/1      Ok to refill today

## 2018-05-19 ENCOUNTER — Other Ambulatory Visit: Payer: Self-pay | Admitting: Family Medicine

## 2018-05-19 DIAGNOSIS — M4802 Spinal stenosis, cervical region: Secondary | ICD-10-CM

## 2018-05-25 ENCOUNTER — Other Ambulatory Visit: Payer: Self-pay | Admitting: Family Medicine

## 2018-06-23 ENCOUNTER — Telehealth: Payer: Self-pay | Admitting: Family Medicine

## 2018-06-23 ENCOUNTER — Encounter: Payer: Self-pay | Admitting: Internal Medicine

## 2018-06-23 ENCOUNTER — Ambulatory Visit (INDEPENDENT_AMBULATORY_CARE_PROVIDER_SITE_OTHER): Payer: Medicare HMO | Admitting: Internal Medicine

## 2018-06-23 VITALS — BP 128/78 | HR 89 | Temp 97.9°F | Resp 16 | Ht 63.0 in | Wt 133.0 lb

## 2018-06-23 DIAGNOSIS — R21 Rash and other nonspecific skin eruption: Secondary | ICD-10-CM

## 2018-06-23 MED ORDER — HYDROCORTISONE 2.5 % EX CREA: TOPICAL_CREAM | Freq: Two times a day (BID) | CUTANEOUS | 0 refills | Status: AC

## 2018-06-23 NOTE — Progress Notes (Signed)
Subjective:    Patient ID: Alexa [name], female    DOB: [birth year], 84 y.o.   MRN: [id number]  DOS:  06/23/2018 Type of visit - description : acute Develop a rash around the neck, worse on the posterior aspect, initial rash described as wheals (?). No blisters Initially the lesions were very painful and subsequently itchy. Went to the pharmacist 2 days ago, he was rec hydrocortisone OTC and is better but she was told that she probably has shingles. On further questioning, her daughter-in-law gave her new neck laces and have been using them on and off.  Also wonders about a UTI. Has a history of recurrent UTIs, has urinary frequency at baseline.  Last week she had some funny smell on the urine but that is largely resolved. Denies dysuria, gross hematuria, fever, chills, nausea, lower abdominal pain or flank pain.  Review of Systems As above  Past Medical History:  Diagnosis Date  . Asthma   . Chronic obstructive pulmonary disease ([place])   . Diverticulosis   . Diverticulosis of colon (without mention of hemorrhage)   . Esophageal reflux   . History of colonic polyps 06/05/05   hyperplastic  . Internal hemorrhoids   . Irritable bowel syndrome   . Ischemic colitis ([place])   . Malignant neoplasm of kidney and other and unspecified urinary organs    renal carcinoma, left nephrectomy in 2008-per patient.  . Other and unspecified hyperlipidemia   . Personal history of venous thrombosis and embolism    PTE after nephrectomy  . Spinal stenosis   . Type II or unspecified type diabetes mellitus without mention of complication, not stated as uncontrolled 02/05/2012   pt states it was in 2007  . Unspecified essential hypertension     Past Surgical History:  Procedure Laterality Date  . BREAST BIOPSY    . COLONOSCOPY W/ POLYPECTOMY  2000  . COLONOSCOPY W/ POLYPECTOMY  07/2004   Hyperplastic polyps  . DILATION AND CURETTAGE OF UTERUS  03/2005   Uterine Polyps  . NEPHRECTOMY  2007   right,Dr Dahlstedt  . SEPTOPLASTY    . TONSILLECTOMY      Social History   Socioeconomic History  . Marital status: Married    Spouse name: Not on file  . Number of children: Not on file  . Years of education: Not on file  . Highest education level: Not on file  Occupational History  . Not on file  Social Needs  . Financial resource strain: Not on file  . Food insecurity:    Worry: Not on file    Inability: Not on file  . Transportation needs:    Medical: Not on file    Non-medical: Not on file  Tobacco Use  . Smoking status: Never Smoker  . Smokeless tobacco: Never Used  Substance and Sexual Activity  . Alcohol use: No  . Drug use: No  . Sexual activity: Not Currently  Lifestyle  . Physical activity:    Days per week: Not on file    Minutes per session: Not on file  . Stress: Not on file  Relationships  . Social connections:    Talks on phone: Not on file    Gets together: Not on file    Attends religious service: Not on file    Active member of club or organization: Not on file    Attends meetings of clubs or organizations: Not on file    Relationship status: Not on file  .  Intimate partner violence:    Fear of current or ex partner: Not on file    Emotionally abused: Not on file    Physically abused: Not on file    Forced sexual activity: Not on file  Other Topics Concern  . Not on file  Social History Narrative  . Not on file      Allergies as of 06/23/2018      Reactions   Lisinopril    Cough D/Ced by Dr Melvyn Novas   Codeine    nausea   Verapamil    Pain in feet      Medication List        Accurate as of 06/23/18 11:59 PM. Always use your most recent med list.          albuterol 108 (90 Base) MCG/ACT inhaler Commonly known as:  PROVENTIL HFA;VENTOLIN HFA Inhale 1-2 puffs into the lungs every 6 (six) hours as needed for wheezing or shortness of breath.   aspirin EC 81 MG tablet Take 162 mg by mouth daily.   buPROPion 100 MG  tablet Commonly known as:  WELLBUTRIN Take 1 tablet (100 mg total) by mouth 2 (two) times daily.   busPIRone 7.5 MG tablet Commonly known as:  BUSPAR Take 1 tablet (7.5 mg total) by mouth 2 (two) times daily. May increase to 2 pills daily   CALCIUM PO Take 1 tablet by mouth daily.   CRANBERRY PO Take 2 capsules by mouth 2 (two) times daily.   DIABETIC BASICS HEALTHY FOOT EX Apply 1 application topically daily as needed (foot pain). OTC diabetic foot cream   ESTRACE VAGINAL 0.1 MG/GM vaginal cream Generic drug:  estradiol Apply locally every other night at bedtime   EYE VITAMINS PO Take by mouth.   fluticasone 50 MCG/ACT nasal spray Commonly known as:  FLONASE INSTILL 2 SPRAYS INTO EACH NOSTRIL ONCE DAILY   gabapentin 100 MG capsule Commonly known as:  NEURONTIN TAKE 1 CAPSULE BY MOUTH FOUR TIMES A DAY   glucose blood test strip Use to test blood sugar ICD 10 E11 9   hydrocortisone 2.5 % cream Apply topically 2 (two) times daily.   hydrOXYzine 25 MG tablet Commonly known as:  ATARAX/VISTARIL Take 1 tablet (25 mg total) every 8 (eight) hours as needed by mouth for anxiety or nausea.   polyethylene glycol powder powder Commonly known as:  GLYCOLAX/MIRALAX Take 0.5 Containers by mouth daily as needed for mild constipation. Use as needed   SYMBICORT 80-4.5 MCG/ACT inhaler Generic drug:  budesonide-formoterol INHALE 2 PUFFS BY MOUTH TWICE A DAY   traMADol 50 MG tablet Commonly known as:  ULTRAM TAKE 1 TABLET BY MOUTH EVERY 6 HOURS AS NEEDED FOR BACK PAIN   TRUEPLUS LANCETS 28G Misc Use to test blood sugar ICD 10 E11 9   TRUERESULT BLOOD GLUCOSE w/Device Kit Use to test blood sugar ICD 10 E11 9   valsartan 40 MG tablet Commonly known as:  DIOVAN Take 1 tablet (40 mg total) by mouth daily.   Vitamin C Powd Take 1 packet by mouth 2 (two) times daily as needed (cold symptoms).   VITAMIN D PO Take 1 tablet by mouth daily.           Objective:    Physical Exam  HENT:  Head:     BP 128/78 (BP Location: Left Arm, Patient Position: Sitting, Cuff Size: Small)   Pulse 89   Temp 97.9 F (36.6 C) (Oral)   Resp 16  Ht 5' 3"  (1.6 m)   Wt 133 lb (60.3 kg)   SpO2 98%   BMI 23.56 kg/m  General:   Well developed, NAD, BMI noted. HEENT:  Normocephalic . Face symmetric, atraumatic  Skin: See graphic Neurologic:  alert & oriented X3.  Speech normal, gait appropriate for age  Psych--  Cognition and judgment appear intact.  Cooperative with normal attention span and concentration.  Behavior appropriate. No anxious or depressed appearing.      Assessment & Plan:     84 year old patient PMH includes DM, HTN, COPD, depression, asthma, GERD, ischemic colitis, renal carcinoma, hyperlipidemia, history of PE,  on HRT presents with the following  Rash, neck: Although etiology is unclear, she does not have shingles which was one of her concerns. She has used new necklaces, allergic reaction?. She is better with OTC hydrocortisone thus will prescribe hydrocortisone 2.5%, avoid new necklaces (declined  to stop using jewelry altogether). Call if not better UTI?  She really does not have any symptoms except urinary frequency which is at baseline.  Will see urology next month, recommend to call if she has any LUTS other than baseline.

## 2018-06-23 NOTE — Telephone Encounter (Signed)
Fine with me

## 2018-06-23 NOTE — Progress Notes (Signed)
Pre visit review using our clinic review tool, if applicable. No additional management support is needed unless otherwise documented below in the visit note. 

## 2018-06-23 NOTE — Patient Instructions (Signed)
Apply the cream twice a day for 1 week  If you are not much better let me know  If you have any symptoms such as burning when you urinate, blood in the urine, fever, chills, lower abdominal pain: come back and let us recheck your urine.

## 2018-06-23 NOTE — Telephone Encounter (Signed)
Decline

## 2018-06-23 NOTE — Telephone Encounter (Signed)
Pt is requesting to switch providers. She said she thinks Dr. Lorelei Pont is a wonderful person but she feels more a connection with Dr. Larose Kells and would like for him to be her pcp. Is it ok from both providers to switch?

## 2018-06-24 NOTE — Telephone Encounter (Signed)
Talked with patient and she said she is fine with staying with Dr. Lorelei Pont as her pcp. She said she didn't think Dr. Larose Kells would approve to take her on and she is ok with that.

## 2018-07-27 ENCOUNTER — Other Ambulatory Visit: Payer: Self-pay | Admitting: Family Medicine

## 2018-07-27 DIAGNOSIS — M4802 Spinal stenosis, cervical region: Secondary | ICD-10-CM

## 2018-08-07 DIAGNOSIS — H35033 Hypertensive retinopathy, bilateral: Secondary | ICD-10-CM | POA: Diagnosis not present

## 2018-08-07 DIAGNOSIS — H353132 Nonexudative age-related macular degeneration, bilateral, intermediate dry stage: Secondary | ICD-10-CM | POA: Diagnosis not present

## 2018-08-07 DIAGNOSIS — H01025 Squamous blepharitis left lower eyelid: Secondary | ICD-10-CM | POA: Diagnosis not present

## 2018-08-07 DIAGNOSIS — H01021 Squamous blepharitis right upper eyelid: Secondary | ICD-10-CM | POA: Diagnosis not present

## 2018-08-07 DIAGNOSIS — H01022 Squamous blepharitis right lower eyelid: Secondary | ICD-10-CM | POA: Diagnosis not present

## 2018-08-07 DIAGNOSIS — H01024 Squamous blepharitis left upper eyelid: Secondary | ICD-10-CM | POA: Diagnosis not present

## 2018-08-07 DIAGNOSIS — H0288B Meibomian gland dysfunction left eye, upper and lower eyelids: Secondary | ICD-10-CM | POA: Diagnosis not present

## 2018-08-07 DIAGNOSIS — H0288A Meibomian gland dysfunction right eye, upper and lower eyelids: Secondary | ICD-10-CM | POA: Diagnosis not present

## 2018-08-07 DIAGNOSIS — H16143 Punctate keratitis, bilateral: Secondary | ICD-10-CM | POA: Diagnosis not present

## 2018-08-24 ENCOUNTER — Other Ambulatory Visit (INDEPENDENT_AMBULATORY_CARE_PROVIDER_SITE_OTHER): Payer: Medicare HMO

## 2018-08-24 ENCOUNTER — Other Ambulatory Visit: Payer: Self-pay | Admitting: Family Medicine

## 2018-08-24 DIAGNOSIS — R300 Dysuria: Secondary | ICD-10-CM

## 2018-08-24 NOTE — Progress Notes (Signed)
Pt brought in a urine sample today

## 2018-08-26 ENCOUNTER — Telehealth: Payer: Self-pay

## 2018-08-26 ENCOUNTER — Telehealth: Payer: Self-pay | Admitting: Family Medicine

## 2018-08-26 DIAGNOSIS — R82998 Other abnormal findings in urine: Secondary | ICD-10-CM

## 2018-08-26 DIAGNOSIS — M4802 Spinal stenosis, cervical region: Secondary | ICD-10-CM

## 2018-08-26 LAB — URINE CULTURE
MICRO NUMBER: 108478
SPECIMEN QUALITY: ADEQUATE

## 2018-08-26 MED ORDER — TRAMADOL HCL 50 MG PO TABS: ORAL_TABLET | ORAL | 1 refills | Status: DC

## 2018-08-26 MED ORDER — CEPHALEXIN 500 MG PO CAPS
500.0000 mg | ORAL_CAPSULE | Freq: Two times a day (BID) | ORAL | 0 refills | Status: DC
Start: 2018-08-26 — End: 2018-12-15

## 2018-08-26 NOTE — Telephone Encounter (Signed)
Patient informed culture has not came back yet. It takes a few days to grow bacteria. Will inform patient of results when they come in.

## 2018-08-26 NOTE — Telephone Encounter (Signed)
Patient had requested to bring the urine sample, due to concern of UTI Culture did grow E. Coli, with resistance to several antibiotics as below It looks like Keflex would be an option We will treat with Keflex 500 twice daily for 7 days Called to speak with her, she notes that she is having some burning with urination but no fever Let her know I called in the [place] She has requested I refill her tramadol   Results for orders placed or performed in visit on 08/24/18  Urine Culture  Result Value Ref Range   MICRO NUMBER: [id number]    SPECIMEN QUALITY: Adequate    Sample Source NOT GIVEN    STATUS: FINAL    ISOLATE 1: Escherichia coli (A)       Susceptibility   Escherichia coli - URINE CULTURE, REFLEX    AMOX/CLAVULANIC <=2 Sensitive     AMPICILLIN >=32 Resistant     AMPICILLIN/SULBACTAM 4 Sensitive     CEFAZOLIN* <=4 Not Reportable      * For infections other than uncomplicated UTIcaused by E. coli, K. pneumoniae or P. mirabilis:Cefazolin is resistant if MIC > or = 8 mcg/mL.(Distinguishing susceptible versus intermediatefor isolates with MIC < or = 4 mcg/mL requiresadditional testing.)For uncomplicated UTI caused by E. coli,K. pneumoniae or P. mirabilis: Cefazolin issusceptible if MIC <32 mcg/mL and predictssusceptible to the oral agents cefaclor, cefdinir,cefpodoxime, cefprozil, cefuroxime, cephalexinand loracarbef.    CEFEPIME <=1 Sensitive     CEFTRIAXONE <=1 Sensitive     CIPROFLOXACIN >=4 Resistant     LEVOFLOXACIN >=8 Resistant     ERTAPENEM <=0.5 Sensitive     GENTAMICIN <=1 Sensitive     IMIPENEM <=0.25 Sensitive     NITROFURANTOIN <=16 Sensitive     PIP/TAZO <=4 Sensitive     TOBRAMYCIN <=1 Sensitive     TRIMETH/SULFA* >=320 Resistant      * For infections other than uncomplicated UTIcaused by E. coli, K. pneumoniae or P. mirabilis:Cefazolin is resistant if MIC > or = 8 mcg/mL.(Distinguishing susceptible versus intermediatefor isolates with MIC < or = 4 mcg/mL  requiresadditional testing.)For uncomplicated UTI caused by E. coli,K. pneumoniae or P. mirabilis: Cefazolin issusceptible if MIC <32 mcg/mL and predictssusceptible to the oral agents cefaclor, cefdinir,cefpodoxime, cefprozil, cefuroxime, cephalexinand loracarbef.Legend:S = Susceptible  I = IntermediateR = Resistant  NS = Not susceptible* = Not tested  NR = Not reported**NN = See antimicrobic comments   I checked [place]; she is ok for refill today of trmadol as well   07/28/2018  1   07/28/2018  Tramadol Hcl 50 Mg Tablet  120.00 30 Je Cop  [id number]  Wal ([id number])  0/1 20.00 MME Comm Ins  [place]  06/26/2018  1   04/27/2018  Tramadol Hcl 50 Mg Tablet  120.00 30 Je Cop  [id number]  Wal (0618)  1/1 20.00 MME Comm Ins  [place]  05/29/2018  1   04/27/2018  Tramadol Hcl 50 Mg Tablet  120.00 30 Je Cop  [id number]  Wal (0618)  0/1 20.00 MME Comm Ins  [place]  04/27/2018  1   04/27/2018  Tramadol Hcl 50 Mg Tablet  120.00 30 Je Cop  [id number]  Wal (0618)  0/2 20.00 MME Comm Ins  [place]  03/25/2018  1   01/20/2018  Tramadol Hcl 50 Mg Tablet  120.00 30 Je Cop  [id number]  Wal ([id number])  2/2 20.00 MME Comm Ins  [place]  02/23/2018  1   01/20/2018  Tramadol Hcl 50 Mg Tablet  120.00 30  Je Cop  [id number]  Wal [phone]  1/2 20.00 MME Comm Ins  [place]  01/20/2018  1   01/20/2018  Tramadol Hcl 50 Mg Tablet  120.00 30 Je Cop  [id number]  Wal [phone])  0/2 20.00 MME Comm Ins  [place]  12/21/2017  1   12/16/2017  Tramadol Hcl 50 Mg Tablet  120.00 30 Je Cop  [id number]  Wal (0618)  0/0 20.00 MME Comm Ins  [place]  11/26/2017  1   11/18/2017  Acetaminophen-Cod #3 Tablet  20.00 3 Ku Tan  [id number]  Wal (0618)  0/0 30.00 MME Comm Ins  [place]  11/21/2017  1   09/23/2017  Tramadol Hcl 50 Mg Tablet  120.00 30 [address]  [id number]  Wal [phone])  1/1 20.00 MME Comm Ins  [place]  10/22/2017  1   09/23/2017  Tramadol Hcl 50 Mg Tablet  120.00 [address]  [id number]  Wal [phone])  0/1

## 2018-08-26 NOTE — Telephone Encounter (Signed)
Copied from [place] [id number]. Topic: General - Other >> Aug 24, 2018  3:59 PM Windy Kalata wrote: Reason for CRM: Patient called would like her lab results  Best call back is [phone] >> Aug 25, 2018  3:47 PM Windy Kalata wrote: Patient calling asking for lab results of urine culture

## 2018-09-14 ENCOUNTER — Telehealth: Payer: Self-pay | Admitting: Family Medicine

## 2018-09-14 DIAGNOSIS — M2550 Pain in unspecified joint: Secondary | ICD-10-CM

## 2018-09-14 NOTE — Telephone Encounter (Signed)
She would like to see rheumatology about her joint pain, I am glad to refer her

## 2018-09-14 NOTE — Telephone Encounter (Signed)
I saw Alexa [name] when she was in with her husband today.  She requested refill of tramadol which she uses for arthritis pain  08/25/2018  1   07/28/2018  Tramadol Hcl 50 MG Tablet  120.00 30 Je Cop   [id number]   Wal ([id number])   1  20.00 MME  Comm Ins   [place]  07/28/2018  1   07/28/2018  Tramadol Hcl 50 MG Tablet  120.00 30 Je Cop   [id number]   Wal ([id number])   0  20.00 MME  Comm Ins   [place]  06/26/2018  1   04/27/2018  Tramadol Hcl 50 MG Tablet  120.00 30 Je Cop   [id number]   Wal (0618)   1  20.00 MME  Comm Ins   [place]  05/29/2018  1   04/27/2018  Tramadol Hcl 50 MG Tablet  120.00 30 Je Cop   [id number]   Wal (0618)   0  20.00 MME  Comm Ins   [place]  04/27/2018  1   04/27/2018  Tramadol Hcl 50 MG Tablet  120.00 30 Je Cop   [id number]   Wal (0618)   0  20.00 MME  Comm Ins   [place]  03/25/2018  1   01/20/2018  Tramadol Hcl 50 MG Tablet  120.00 30 Je Cop   [id number]   Wal ([id number])   2  20.00 MME  Comm Ins   [place]  02/23/2018  1   01/20/2018  Tramadol Hcl 50 MG Tablet  120.00 30 Je Cop   [id number]   Wal (0618)   1  20.00 MME  Comm Ins   [place]  01/20/2018  1   01/20/2018  Tramadol Hcl 50 MG Tablet  120.00 30 Je Cop   [id number]   Wal ([id number])   0  20.00 MME  Comm Ins   [place]  12/21/2017  1   12/16/2017  Tramadol Hcl 50 MG Tablet  120.00 30 Je Cop   [id number]   Wal ([id number])   0  20.00 MME  Comm Ins   [place]  11/26/2017  1   11/18/2017  Acetaminophen-Cod #3 Tablet  20.00 3 Ku Tan   [id number]   Wal (0618)   0  30.00 MME  Comm Ins   [place]   This was refilled less than 1 month ago, so she likely cannot pick it up yet I do think she has refills available at her drugstore

## 2018-09-14 NOTE — Addendum Note (Signed)
Addended by: Lamar Blinks C on: 09/14/2018 01:58 PM   Modules accepted: Orders

## 2018-09-28 ENCOUNTER — Telehealth: Payer: Self-pay | Admitting: Family Medicine

## 2018-09-28 DIAGNOSIS — H5213 Myopia, bilateral: Secondary | ICD-10-CM | POA: Diagnosis not present

## 2018-09-28 DIAGNOSIS — H524 Presbyopia: Secondary | ICD-10-CM | POA: Diagnosis not present

## 2018-09-28 DIAGNOSIS — H52209 Unspecified astigmatism, unspecified eye: Secondary | ICD-10-CM | POA: Diagnosis not present

## 2018-09-28 NOTE — Telephone Encounter (Signed)
Copied from [place] [id number]. Topic: Quick Communication - Rx Refill/Question >> Sep 28, 2018  9:42 AM Loma Boston wrote: valsartan (DIOVAN) 40 MG tablet Long-Term  PT says that Dr Lorelei Pont has told her that she could increase dosage/ under stress lately and running as high as 160 to 170, she is ready to go ahead and increase it to the next level, she does not have but 2 or 3 pills left.  [facility] [phone] fax   [phone] Fax

## 2018-09-29 ENCOUNTER — Encounter: Payer: Self-pay | Admitting: Family Medicine

## 2018-09-29 NOTE — Telephone Encounter (Signed)
Call patient and left message on machine.  I will send a MyChart message that she can reply to I want to get a bit more information

## 2018-09-29 NOTE — Telephone Encounter (Signed)
BP Readings from Last 3 Encounters:  06/23/18 128/78  04/23/18 (!) 170/100  04/03/18 (!) 142/88   She is on diovan 40 mg Called to find out more about recent BP readings

## 2018-09-30 DIAGNOSIS — R8271 Bacteriuria: Secondary | ICD-10-CM | POA: Diagnosis not present

## 2018-09-30 DIAGNOSIS — C659 Malignant neoplasm of unspecified renal pelvis: Secondary | ICD-10-CM | POA: Diagnosis not present

## 2018-11-24 ENCOUNTER — Other Ambulatory Visit: Payer: Self-pay | Admitting: Family Medicine

## 2018-11-24 DIAGNOSIS — M4802 Spinal stenosis, cervical region: Secondary | ICD-10-CM

## 2018-12-15 DIAGNOSIS — Q600 Renal agenesis, unilateral: Secondary | ICD-10-CM | POA: Insufficient documentation

## 2018-12-15 DIAGNOSIS — IMO0002 Reserved for concepts with insufficient information to code with codable children: Secondary | ICD-10-CM | POA: Insufficient documentation

## 2018-12-15 NOTE — Progress Notes (Addendum)
[place] at [facility] [address], [place], [place] [address] [phone]  Date:  12/16/2018   Name:  Alexa [name]   DOB:  1929-08-25   MRN:  [id number]  PCP:  Darreld Mclean, MD    Chief Complaint: Leg Pain (left leg)   History of Present Illness:  Alexa [name] is a 85 y.o. very pleasant female patient who presents with the following:  In person visit today for concern of leg pain Last seen by myself in September History of diabetes, GERD, hyperlipidemia, cervical spinal stenosis, neuropathy, hypertension, back pain, cough variant asthma, renal cancer status post nephrectomy At our last visit I changed her from losartan to valsartan as she felt certain losartan was causing fatigue Today she notes that her blood pressure has been running a bit high at home, she has noted systolic pressure to 675 on two occasions.  Lab Results  Component Value Date   HGBA1C 7.3 (H) 12/16/2018   Due for an A1c, other routine labs Due for foot exam-done today Eye exam: this was done about 3 months ago.  She notes her ophthalmologist has diagnosed her with macular degeneration Can suggest Shingrix  Medication list as follows; Aspirin 81 Wellbutrin 100 twice daily Buspirone?  After discussion I am not clear if she is taking this.  She did not bring her medications with her today Gabapentin 100 4 times daily Symbicort Tramadol as needed Valsartan 40  Magdaline and her husband help to take care of 2 great granddaughters, since their mother was killed in a motor vehicle accident  She notes that she is "aching all over" which she thinks is due to arthritis She is taking tramadol 50 about every 6 hours, but notes that her pain sometimes comes back before her next dose is due.  She is also taking gabapentin 100 4 times daily.  She feels like these medications help somewhat, but her pain is not entirely relieved. She does not really have spine/  neck pain right now, it is more the peripheral joints She feels like her pain is "all over," she cannot really be more specific with me She wonders if there is "a problem with my bone marrow" She speaks for some time today about aching and pain over really her entire body  She notes that this past Saturday (today is Wednesday) she was having increased pain in her left leg.  She is not sure why this happened, no known injury.  This lasted about 1 day and her leg returned to baseline She notes that when she is more active she will have more pain, but she also has increased pain if she sits for a while and then tries to move again  I did refer her to rheumatology back in February for joint pains, but she does not think she ever heard from them.  It looks like she was referred to [facility] rheumatology but never had an appt scheduled  She felt tired and like perhaps she had a mild virus last week, but better now No fever or cough  She wonders if she can have perhaps 10 more tramadol per month, so she could take an extra on occasion  She also notes a rash on her neck, chest, upper back. She discussed this with my partner Dr. Larose Kells in the fall He gave her some 2.5% hydrocortisone cream to use.  She notes that the rash is itchy and irritated.  She has been putting peroxide on it "to dry it out"   Patient Active Problem List   Diagnosis Date Noted  . Solitary kidney 12/15/2018  . Cough variant asthma 12/29/2014  . Spinal stenosis in cervical region 12/13/2014  . Radiculopathy, lumbar region 12/13/2014  . Depression 05/03/2008  . PERONEAL NEUROPATHY 05/03/2008  . Malignant neoplasm of kidney excluding renal pelvis ([place]) 08/06/2007  . Controlled type 2 diabetes mellitus without complication, without long-term current use of insulin ([place]) 08/06/2007  . GERD 08/06/2007  . PULMONARY EMBOLISM, HX OF 08/06/2007  . Hyperlipidemia 09/03/2006  . Essential hypertension 09/03/2006  . IBS 09/03/2006     Past Medical History:  Diagnosis Date  . Asthma   . Chronic obstructive pulmonary disease ([place])   . Diverticulosis   . Diverticulosis of colon (without mention of hemorrhage)   . Esophageal reflux   . History of colonic polyps 06/05/05   hyperplastic  . Internal hemorrhoids   . Irritable bowel syndrome   . Ischemic colitis ([place])   . Malignant neoplasm of kidney and other and unspecified urinary organs    renal carcinoma, left nephrectomy in 2008-per patient.  . Other and unspecified hyperlipidemia   . Personal history of venous thrombosis and embolism    PTE after nephrectomy  . Spinal stenosis   . Type II or unspecified type diabetes mellitus without mention of complication, not stated as uncontrolled 02/05/2012   pt states it was in 2007  . Unspecified essential hypertension     Past Surgical History:  Procedure Laterality Date  . BREAST BIOPSY    . COLONOSCOPY W/ POLYPECTOMY  2000  . COLONOSCOPY W/ POLYPECTOMY  07/2004   Hyperplastic polyps  . DILATION AND CURETTAGE OF UTERUS  03/2005   Uterine Polyps  . NEPHRECTOMY  2007   right,Dr Dahlstedt  . SEPTOPLASTY    . TONSILLECTOMY      Social History   Tobacco Use  . Smoking status: Never Smoker  . Smokeless tobacco: Never Used  Substance Use Topics  . Alcohol use: No  . Drug use: No    Family History  Problem Relation Age of Onset  . Stroke Mother        onset in 68s  . Diabetes Mother   . Cancer Mother        bladder; nephrectomy for calculi/also uterine , TAH & BSO  . Aneurysm Mother        thoracic  . Depression Mother   . Stroke Brother   . Heart failure Brother   . Heart attack Other        maternal family history  . Heart failure Maternal Grandfather   . Lung cancer Father        smoked  . Heart failure Sister   . Depression Sister   . Alzheimer's disease Sister   . COPD Sister   . Aneurysm Brother        cns  . Depression Maternal Aunt        X2  . Bipolar disorder Sister   .  Alcoholism Neg Hx     Allergies  Allergen Reactions  . Lisinopril     Cough D/Ced by Dr Melvyn Novas  . Codeine     nausea  . Verapamil     Pain in feet    Medication list has been reviewed and updated.  Current Outpatient Medications on File Prior to Visit  Medication Sig Dispense Refill  . Ascorbic Acid (VITAMIN C) POWD Take  1 packet by mouth 2 (two) times daily as needed (cold symptoms).    Marland Kitchen aspirin EC 81 MG tablet Take 162 mg by mouth daily.    . Benzalkonium Chloride (DIABETIC BASICS HEALTHY FOOT EX) Apply 1 application topically daily as needed (foot pain). OTC diabetic foot cream    . Blood Glucose Monitoring Suppl (TRUERESULT BLOOD GLUCOSE) W/DEVICE KIT Use to test blood sugar ICD 10 E11 9 1 each 0  . busPIRone (BUSPAR) 7.5 MG tablet Take 1 tablet (7.5 mg total) by mouth 2 (two) times daily. May increase to 2 pills daily 120 tablet 6  . CALCIUM PO Take 1 tablet by mouth daily.    . Cholecalciferol (VITAMIN D PO) Take 1 tablet by mouth daily.    Marland Kitchen CRANBERRY PO Take 2 capsules by mouth 2 (two) times daily.     Marland Kitchen ESTRACE VAGINAL 0.1 MG/GM vaginal cream Apply locally every other night at bedtime  1  . gabapentin (NEURONTIN) 100 MG capsule TAKE 1 CAPSULE BY MOUTH FOUR TIMES A DAY 120 capsule 6  . glucose blood (TRUETEST TEST) test strip Use to test blood sugar ICD 10 E11 9 100 each 3  . hydrocortisone 2.5 % cream Apply topically 2 (two) times daily. 60 g 0  . Multiple Vitamins-Minerals (EYE VITAMINS PO) Take by mouth.    . polyethylene glycol powder (GLYCOLAX/MIRALAX) powder Take 0.5 Containers by mouth daily as needed for mild constipation. Use as needed    . SYMBICORT 80-4.5 MCG/ACT inhaler INHALE 2 PUFFS BY MOUTH TWICE A DAY 10.2 g 3  . TRUEPLUS LANCETS 28G MISC Use to test blood sugar ICD 10 E11 9 100 each 3   No current facility-administered medications on file prior to visit.     Review of Systems:  As per HPI- otherwise negative.   Physical Examination: Vitals:    12/16/18 1125 12/16/18 1203  BP: (!) 171/79 (!) 162/90  Pulse: 96   Resp: 12   Temp: 98.1 F (36.7 C)   SpO2: 98%    Vitals:   12/16/18 1125  Weight: 129 lb 9.6 oz (58.8 kg)  Height: 5' 3"  (1.6 m)   Body mass index is 22.96 kg/m. Ideal Body Weight: Weight in (lb) to have BMI = 25: 140.8  GEN: WDWN, NAD, Non-toxic, A & O x 3, well appearing elderly lady who seems her normal self today  HEENT: Atraumatic, Normocephalic. Neck supple. No masses, No LAD. Ears and Nose: No external deformity. CV: RRR, No M/G/R. No JVD. No thrill. No extra heart sounds. PULM: CTA B, no wheezes, crackles, rhonchi. No retractions. No resp. distress. No accessory muscle use. EXTR: No c/c/e NEURO Normal gait for pt, she uses a cane PSYCH: Normally interactive. Conversant. Not depressed or anxious appearing.  Calm demeanor.  Foot exam performed today -strong pulses, no swelling.  Feet are warm and well-perfused.  She notes lack of monofilament sensation at the great toe bilaterally, otherwise sensation is normal She appears to have normal strength of both lower extremities, no pain with flexion of the left knee or hip Dry, slightly erythematous skin on her upper back.  Appears most consistent with a dry skin dermatitis or eczema.  No abnormality noted on the chest or neck  Assessment and Plan: Hyperlipidemia, unspecified hyperlipidemia type - Plan: Lipid panel  Spinal stenosis in cervical region - Plan: traMADol (ULTRAM) 50 MG tablet  Controlled type 2 diabetes mellitus without complication, without long-term current use of insulin (HCC) - Plan: Comprehensive metabolic  panel, Hemoglobin A1c  Essential hypertension - Plan: CBC, Comprehensive metabolic panel, valsartan (DIOVAN) 40 MG tablet, DISCONTINUED: valsartan (DIOVAN) 80 MG tablet  Solitary kidney  Chronic obstructive pulmonary disease, unspecified COPD type ([place]) - Plan: albuterol (PROAIR HFA) 108 (90 Base) MCG/ACT inhaler  Depression, unspecified  depression type - Plan: buPROPion (WELLBUTRIN) 100 MG tablet  Dry skin dermatitis - Plan: triamcinolone (KENALOG) 0.025 % cream  Nasal congestion - Plan: fluticasone (FLONASE) 50 MCG/ACT nasal spray  Follow-up visit for several concerns today Routine labs as above- Will plan further follow- up pending labs. Check A1c and lipids today Her SBP has been elevated on 40 of valsartan, but DBP is borderline.  Will increase slightly to 60 mg, she will let me know how she responds to this change Refilled medications as above Allow an increase in monthly tramadol to #130 pills; this way she can take 100 mg on occasion if needed She will stop using peroxide in her skin, I gave her triamcinolone to use instead.  She does have a dermatologist who she might wish to consult as well    Signed Lamar Blinks, MD  Received her labs, message to patient  Results for orders placed or performed in visit on 12/16/18  CBC  Result Value Ref Range   WBC 8.6 4.0 - 10.5 K/uL   RBC 5.09 3.87 - 5.11 Mil/uL   Platelets 340.0 150.0 - 400.0 K/uL   Hemoglobin 15.3 (H) 12.0 - 15.0 g/dL   HCT 44.7 36.0 - 46.0 %   MCV 87.9 78.0 - 100.0 fl   MCHC 34.3 30.0 - 36.0 g/dL   RDW 14.3 11.5 - 15.5 %  Comprehensive metabolic panel  Result Value Ref Range   Sodium 137 135 - 145 mEq/L   Potassium 4.5 3.5 - 5.1 mEq/L   Chloride 99 96 - 112 mEq/L   CO2 28 19 - 32 mEq/L   Glucose, Bld 233 (H) 70 - 99 mg/dL   BUN 19 6 - 23 mg/dL   Creatinine, Ser 0.98 0.40 - 1.20 mg/dL   Total Bilirubin 0.7 0.2 - 1.2 mg/dL   Alkaline Phosphatase 71 39 - 117 U/L   AST 15 0 - 37 U/L   ALT 15 0 - 35 U/L   Total Protein 7.0 6.0 - 8.3 g/dL   Albumin 3.9 3.5 - 5.2 g/dL   Calcium 9.2 8.4 - 10.5 mg/dL   GFR 53.67 (L) >60.00 mL/min  Hemoglobin A1c  Result Value Ref Range   Hgb A1c MFr Bld 7.3 (H) 4.6 - 6.5 %  Lipid panel  Result Value Ref Range   Cholesterol 186 0 - 200 mg/dL   Triglycerides 184.0 (H) 0.0 - 149.0 mg/dL   HDL 42.90 >39.00  mg/dL   VLDL 36.8 0.0 - 40.0 mg/dL   LDL Cholesterol 106 (H) 0 - 99 mg/dL   Total CHOL/HDL Ratio 4    NonHDL 142.79     5/21 addnd-Rx for valsartan 80 was sent in error.  This was not dispensed, contacted pharmacy and gave correct prescription information  Meds ordered this encounter  Medications  . albuterol (PROAIR HFA) 108 (90 Base) MCG/ACT inhaler    Sig: Inhale 1-2 puffs into the lungs every 6 (six) hours as needed for wheezing or shortness of breath.    Dispense:  8.5 Inhaler    Refill:  5  . buPROPion (WELLBUTRIN) 100 MG tablet    Sig: Take 1 tablet (100 mg total) by mouth 2 (two) times daily.  Dispense:  180 tablet    Refill:  3  . DISCONTD: valsartan (DIOVAN) 80 MG tablet    Sig: Take 1.5 (60) tablets daily for BP    Dispense:  135 tablet    Refill:  3    If breaking tabs is not allowed please contact MD  . traMADol (ULTRAM) 50 MG tablet    Sig: Take 1 every 6 hours as needed by pain.  May occasionally take 2 pills if needed. This is a 30 day supply    Dispense:  130 tablet    Refill:  2  . triamcinolone (KENALOG) 0.025 % cream    Sig: Apply 1 application topically 2 (two) times daily. Use as needed for dry skin on back    Dispense:  90 g    Refill:  0  . fluticasone (FLONASE) 50 MCG/ACT nasal spray    Sig: INSTILL 2 SPRAYS INTO EACH NOSTRIL ONCE DAILY    Dispense:  48 g    Refill:  3    **Patient requests 90 days supply**  . valsartan (DIOVAN) 40 MG tablet    Sig: Take 1.5 (65m) tablets daily for BP    Dispense:  135 tablet    Refill:  3    If breaking tabs is not allowed please contact MD.  Please disregard previous rx for 80 mg  '

## 2018-12-15 NOTE — Patient Instructions (Addendum)
It was nice to see you today, I will be in touch with your labs You might consider having the shingles vaccine, called Shingrix, given at your drugstore  Please call [facility] to request an appt- we referred you to see them in February but it appears you did not connect.  You can reach them at:   Address: [place], [place], [place] [address] Phone: [phone]  Let's increase your valsartan to 60 mg daily to bring down your BP - let me know how your BP responds I increased your monthly tramadol rx to #130 pills from 120  This means you can occasionally take 2 pills instead of 1.  Please make sure the 130 lasts for 30 days I sent in triamcinolone cream to try for your rash; also certainly you can ask your dermatologist for their recommendation  I will be in touch with your labs

## 2018-12-16 ENCOUNTER — Encounter: Payer: Self-pay | Admitting: Family Medicine

## 2018-12-16 ENCOUNTER — Other Ambulatory Visit: Payer: Self-pay

## 2018-12-16 ENCOUNTER — Ambulatory Visit (INDEPENDENT_AMBULATORY_CARE_PROVIDER_SITE_OTHER): Payer: Medicare HMO | Admitting: Family Medicine

## 2018-12-16 VITALS — BP 162/90 | HR 96 | Temp 98.1°F | Resp 12 | Ht 63.0 in | Wt 129.6 lb

## 2018-12-16 DIAGNOSIS — L853 Xerosis cutis: Secondary | ICD-10-CM

## 2018-12-16 DIAGNOSIS — E119 Type 2 diabetes mellitus without complications: Secondary | ICD-10-CM | POA: Diagnosis not present

## 2018-12-16 DIAGNOSIS — J449 Chronic obstructive pulmonary disease, unspecified: Secondary | ICD-10-CM

## 2018-12-16 DIAGNOSIS — E785 Hyperlipidemia, unspecified: Secondary | ICD-10-CM

## 2018-12-16 DIAGNOSIS — IMO0002 Reserved for concepts with insufficient information to code with codable children: Secondary | ICD-10-CM

## 2018-12-16 DIAGNOSIS — M4802 Spinal stenosis, cervical region: Secondary | ICD-10-CM | POA: Diagnosis not present

## 2018-12-16 DIAGNOSIS — I10 Essential (primary) hypertension: Secondary | ICD-10-CM

## 2018-12-16 DIAGNOSIS — F32A Depression, unspecified: Secondary | ICD-10-CM

## 2018-12-16 DIAGNOSIS — Q600 Renal agenesis, unilateral: Secondary | ICD-10-CM

## 2018-12-16 DIAGNOSIS — R0981 Nasal congestion: Secondary | ICD-10-CM | POA: Diagnosis not present

## 2018-12-16 DIAGNOSIS — F329 Major depressive disorder, single episode, unspecified: Secondary | ICD-10-CM | POA: Diagnosis not present

## 2018-12-16 LAB — COMPREHENSIVE METABOLIC PANEL
ALT: 15 U/L (ref 0–35)
AST: 15 U/L (ref 0–37)
Albumin: 3.9 g/dL (ref 3.5–5.2)
Alkaline Phosphatase: 71 U/L (ref 39–117)
BUN: 19 mg/dL (ref 6–23)
CO2: 28 mEq/L (ref 19–32)
Calcium: 9.2 mg/dL (ref 8.4–10.5)
Chloride: 99 mEq/L (ref 96–112)
Creatinine, Ser: 0.98 mg/dL (ref 0.40–1.20)
GFR: 53.67 mL/min — ABNORMAL LOW (ref >60.00)
Glucose, Bld: 233 mg/dL — ABNORMAL HIGH (ref 70–99)
Potassium: 4.5 mEq/L (ref 3.5–5.1)
Sodium: 137 mEq/L (ref 135–145)
Total Bilirubin: 0.7 mg/dL (ref 0.2–1.2)
Total Protein: 7 g/dL (ref 6.0–8.3)

## 2018-12-16 LAB — LIPID PANEL
Cholesterol: 186 mg/dL (ref 0–200)
HDL: 42.9 mg/dL (ref >39.00)
LDL Cholesterol: 106 mg/dL — ABNORMAL HIGH (ref 0–99)
NonHDL: 142.79
Total CHOL/HDL Ratio: 4
Triglycerides: 184 mg/dL — ABNORMAL HIGH (ref 0.0–149.0)
VLDL: 36.8 mg/dL (ref 0.0–40.0)

## 2018-12-16 LAB — HEMOGLOBIN A1C: Hgb A1c MFr Bld: 7.3 % — ABNORMAL HIGH (ref 4.6–6.5)

## 2018-12-16 LAB — CBC
HCT: 44.7 % (ref 36.0–46.0)
Hemoglobin: 15.3 g/dL — ABNORMAL HIGH (ref 12.0–15.0)
MCHC: 34.3 g/dL (ref 30.0–36.0)
MCV: 87.9 fl (ref 78.0–100.0)
Platelets: 340 10*3/uL (ref 150.0–400.0)
RBC: 5.09 Mil/uL (ref 3.87–5.11)
RDW: 14.3 % (ref 11.5–15.5)
WBC: 8.6 10*3/uL (ref 4.0–10.5)

## 2018-12-16 MED ORDER — FLUTICASONE PROPIONATE 50 MCG/ACT NA SUSP: NASAL | 3 refills | Status: DC

## 2018-12-16 MED ORDER — TRAMADOL HCL 50 MG PO TABS: ORAL_TABLET | ORAL | 2 refills | Status: DC

## 2018-12-16 MED ORDER — TRIAMCINOLONE ACETONIDE 0.025 % EX CREA: 1.0000 "application " | TOPICAL_CREAM | Freq: Two times a day (BID) | CUTANEOUS | 0 refills | Status: DC

## 2018-12-16 MED ORDER — BUPROPION HCL 100 MG PO TABS: 100.0000 mg | ORAL_TABLET | Freq: Two times a day (BID) | ORAL | 3 refills | Status: DC

## 2018-12-16 MED ORDER — VALSARTAN 40 MG PO TABS: ORAL_TABLET | ORAL | 3 refills | Status: DC

## 2018-12-16 MED ORDER — ALBUTEROL SULFATE HFA 108 (90 BASE) MCG/ACT IN AERS
1.0000 | INHALATION_SPRAY | Freq: Four times a day (QID) | RESPIRATORY_TRACT | 5 refills | Status: DC | PRN
Start: 2018-12-16 — End: 2019-09-30

## 2018-12-16 MED ORDER — VALSARTAN 80 MG PO TABS: ORAL_TABLET | ORAL | 3 refills | Status: DC

## 2018-12-16 NOTE — Addendum Note (Signed)
Addended by: Lamar Blinks C on: 12/16/2018 11:28 PM   Modules accepted: Orders

## 2018-12-23 ENCOUNTER — Other Ambulatory Visit: Payer: Self-pay | Admitting: Family Medicine

## 2018-12-23 DIAGNOSIS — I10 Essential (primary) hypertension: Secondary | ICD-10-CM

## 2018-12-26 ENCOUNTER — Other Ambulatory Visit: Payer: Self-pay | Admitting: Family Medicine

## 2018-12-26 DIAGNOSIS — M4802 Spinal stenosis, cervical region: Secondary | ICD-10-CM

## 2019-01-01 ENCOUNTER — Telehealth: Payer: Self-pay

## 2019-01-01 NOTE — Telephone Encounter (Signed)
Copied from [place] [id number]. Topic: General - Inquiry >> Dec 31, 2018 10:39 AM Mathis Bud wrote: Reason for CRM:  Anderson Malta from [facility] called stating they need more information regarding the referral from PCP.  Anderson Malta states pcp to send notes to doctor  Lenna Gilford and [place]  Call back # [phone]

## 2019-01-04 ENCOUNTER — Telehealth: Payer: Self-pay

## 2019-01-04 NOTE — Telephone Encounter (Signed)
Patient calling to check status of referral. She is requesting a call back as soon as possible.

## 2019-01-04 NOTE — Telephone Encounter (Signed)
Copied from [place] [id number]. Topic: Referral - Request for Referral >> Jan 04, 2019 11:04 AM Nils Flack wrote: Has patient seen PCP for this complaint? Yes.   *If NO, is insurance requiring patient see PCP for this issue before PCP can refer them? Referral for which specialty: rheumatology Preferred provider/office: [facility]  Reason for referral: pain all over, pt was told by them that the Dr needed to call  Cb is [phone]

## 2019-01-05 NOTE — Telephone Encounter (Signed)
Records printed and will fax.  Mel Almond please call her and give her the update that we are getting her records to rheumatology so they can review her case TY

## 2019-01-06 NOTE — Telephone Encounter (Signed)
Anderson Malta, with [facility], calling regarding referral. She states that instead of the office notes for patient's joint pain, they have received notes for a UTI. She is requesting office notes for joint pain only sent to number below. Anderson Malta can be reached directly with any additional questions.     Fax# [phone]

## 2019-01-06 NOTE — Telephone Encounter (Signed)
Notes have been given to my assistant to fax.  We will update pt

## 2019-01-06 NOTE — Telephone Encounter (Signed)
Patient notified and verbalized understanding. Patient has appointment on 01/13/2019 with rheumatology.

## 2019-01-06 NOTE — Telephone Encounter (Signed)
Copied from [place] [id number]. Topic: Referral - Status >> Jan 06, 2019  1:02 PM Ivar Drape wrote: Reason for CRM:   Patient said the Rheumatologist said they have not received the reason why the patient has been sent to them.  The patient is anxious for that information to be sent.  She stated she is having pain in her left leg and body aches.

## 2019-01-12 DIAGNOSIS — Z6823 Body mass index (BMI) 23.0-23.9, adult: Secondary | ICD-10-CM | POA: Diagnosis not present

## 2019-01-12 DIAGNOSIS — M150 Primary generalized (osteo)arthritis: Secondary | ICD-10-CM | POA: Diagnosis not present

## 2019-01-12 DIAGNOSIS — M5416 Radiculopathy, lumbar region: Secondary | ICD-10-CM | POA: Diagnosis not present

## 2019-01-12 DIAGNOSIS — M2550 Pain in unspecified joint: Secondary | ICD-10-CM | POA: Diagnosis not present

## 2019-01-15 DIAGNOSIS — M48061 Spinal stenosis, lumbar region without neurogenic claudication: Secondary | ICD-10-CM | POA: Diagnosis not present

## 2019-01-27 ENCOUNTER — Other Ambulatory Visit: Payer: Self-pay | Admitting: Family Medicine

## 2019-01-27 DIAGNOSIS — Z634 Disappearance and death of family member: Secondary | ICD-10-CM

## 2019-02-01 ENCOUNTER — Telehealth: Payer: Self-pay | Admitting: Family Medicine

## 2019-02-01 NOTE — Telephone Encounter (Signed)
Pt called and stated that she lost the rest of her medication valsartan. Pt states that she has been taking husbands medication for about a week now and Asprin. Pt would like a call back from the nurse regarding. Please advise

## 2019-02-03 DIAGNOSIS — H0102B Squamous blepharitis left eye, upper and lower eyelids: Secondary | ICD-10-CM | POA: Diagnosis not present

## 2019-02-03 DIAGNOSIS — H0102A Squamous blepharitis right eye, upper and lower eyelids: Secondary | ICD-10-CM | POA: Diagnosis not present

## 2019-02-03 DIAGNOSIS — H04123 Dry eye syndrome of bilateral lacrimal glands: Secondary | ICD-10-CM | POA: Diagnosis not present

## 2019-02-03 DIAGNOSIS — H16143 Punctate keratitis, bilateral: Secondary | ICD-10-CM | POA: Diagnosis not present

## 2019-02-05 ENCOUNTER — Other Ambulatory Visit: Payer: Self-pay | Admitting: Family Medicine

## 2019-02-05 DIAGNOSIS — L853 Xerosis cutis: Secondary | ICD-10-CM

## 2019-02-05 NOTE — Telephone Encounter (Signed)
Left message to return call 

## 2019-02-08 MED ORDER — HYDROCODONE-ACETAMINOPHEN 5-325 MG PO TABS: 1.0000 | ORAL_TABLET | Freq: Four times a day (QID) | ORAL | 0 refills | Status: DC | PRN

## 2019-02-08 NOTE — Telephone Encounter (Signed)
I called her back- she is on chronic tramadol  She got #120 on 6/25 She also got 15 hydrocodone on 6/19  However right when I called she was leaving the house - her husband answered the phone and told me that Alexa [name] was just driving away and I should call back

## 2019-02-08 NOTE — Telephone Encounter (Signed)
I called the home phone number twice, no answer and no way to leave message I also called her cell phone and left a message that was trying to reach her.  I am not clear on what is going on with her tailbone, it looks like she was seen elsewhere last month and given hydrocodone.  I do need to get more information before I can treat her for this problem

## 2019-02-08 NOTE — Telephone Encounter (Signed)
Please advise 

## 2019-02-08 NOTE — Telephone Encounter (Signed)
Pt is hurting so bad in her tail bone that she can hardly walk, if she can not get something for pain she is going to have to go to the ER. Please FU her last pain meds are almost gone. Dr Lorelei Pont or nurse to pls call [phone]

## 2019-02-08 NOTE — Telephone Encounter (Signed)
Called and was able to reach her.  She was seen at [facility] and was given prednisone and hydrocodone for her back last month.  I don't have their note yet so I am not sure what their assessment or plan was.  I will refill her hydrocodone but asked her to please call ortho tomorrow and request a follow-up appt   Meds ordered this encounter  Medications  . HYDROcodone-acetaminophen (NORCO/VICODIN) 5-325 MG tablet    Sig: Take 1 tablet by mouth every 6 (six) hours as needed for up to 5 days.    Dispense:  15 tablet    Refill:  0   Reminded her to take tramadol OR vicodin, not both

## 2019-02-08 NOTE — Addendum Note (Signed)
Addended by: Lamar Blinks C on: 02/08/2019 08:23 PM   Modules accepted: Orders

## 2019-02-08 NOTE — Telephone Encounter (Signed)
Pt called and stated something against her tailbone is pressing on nerve and causing pain. Pt states her Tramadol isn't helping much and she only had 15 hydrocodone. Pt was leaving to go get the medication when Dr. Lorelei Pont called. Pt is asking for this to be sent in as an emergency or Dr. Lorelei Pont to call asap

## 2019-02-12 ENCOUNTER — Ambulatory Visit: Payer: Self-pay

## 2019-02-12 NOTE — Telephone Encounter (Signed)
Patient called and says she removed a tick from the top of her left foot a week ago and now she's feeling fatigued. She says the foot was a little red, but not so red now. She says she doesn't have a fever, but has been having a headache off and on. She says there's some spots on her leg that she thought was ticks, but says they were not black like the tick was. I advised a virtual visit tomorrow, she verbalized understanding. Appointment scheduled for tomorrow at 1200 with Dr. Yong Channel, care advice given, advised someone from the office will call tomorrow morning to advise how to set up for the visit, she verbalized understanding.  Reason for Disposition . [1] 2 to 14 days following tick bite AND [2] widespread rash or headache AND [3] no fever  Answer Assessment - Initial Assessment Questions 1. TYPE of TICK: "Is it a wood tick or a deer tick?" If unsure, ask: "What size was the tick?" "Did it look more like a watermelon seed or a poppy seed?"      Maybe a deer tick 2. LOCATION: "Where is the tick bite located?"      Top of left foot 3. ONSET: "How long do you think the tick was attached before you removed it?" (Hours or days)      I don't know, it was itching; removed a week ago 4. TETANUS: "When was the last tetanus booster?"      Unknown 5. PREGNANCY: "Is there any chance you are pregnant?" "When was your last menstrual period?"     No  Protocols used: TICK BITE-A-AH

## 2019-02-13 ENCOUNTER — Other Ambulatory Visit: Payer: Self-pay

## 2019-02-13 ENCOUNTER — Telehealth: Payer: Medicare HMO | Admitting: Family Medicine

## 2019-02-13 NOTE — Progress Notes (Signed)
Patient cancelled visit same day

## 2019-02-15 ENCOUNTER — Other Ambulatory Visit: Payer: Medicare HMO

## 2019-02-15 ENCOUNTER — Ambulatory Visit (INDEPENDENT_AMBULATORY_CARE_PROVIDER_SITE_OTHER): Payer: Medicare HMO | Admitting: Family

## 2019-02-15 ENCOUNTER — Other Ambulatory Visit: Payer: Self-pay

## 2019-02-15 DIAGNOSIS — M2550 Pain in unspecified joint: Secondary | ICD-10-CM | POA: Diagnosis not present

## 2019-02-15 DIAGNOSIS — S90862A Insect bite (nonvenomous), left foot, initial encounter: Secondary | ICD-10-CM

## 2019-02-15 DIAGNOSIS — W57XXXA Bitten or stung by nonvenomous insect and other nonvenomous arthropods, initial encounter: Secondary | ICD-10-CM

## 2019-02-15 NOTE — Progress Notes (Signed)
Virtual Visit via Telephone Note  I connected with Alexa [name] on 02/15/19 at 11:20 AM EDT by telephone and verified that I am speaking with the correct person using two identifiers.  Location: Patient: home Provider: home   I discussed the limitations, risks, security and privacy concerns of performing an evaluation and management service by telephone and the availability of in person appointments. I also discussed with the patient that there may be a patient responsible charge related to this service. The patient expressed understanding and agreed to proceed.   History of Present Illness:  Patient is an 85 yr old female who presents today who presents today to discuss deer tick bite that she noticed 2 months ago.  Patient is a poor historian. Notes that the tick bit her on the top of her left foot 2 months ago.  Report that she has a small red spot remaining where the tick bit her that itches sometimes.    She denies known fever or chills.  Reports that she has had worsening joint pain for the last 2 months. Reports that she has been "sick" for last 2 months.  When I asked her what symptoms she has been experiencing she states, "Just everything."  She reports that she has had rashes "all over" and that she has seen her pcp for the rashes previously. Reports that she saw rheumatology and was told that she does not have rheumatoid arthritis. She reports that she was given prednisone for "pain all over."  She plans to see her spine specialist for a "steroid shot in my back."  Past Medical History:  Diagnosis Date  . Asthma   . Chronic obstructive pulmonary disease ([place])   . Diverticulosis   . Diverticulosis of colon (without mention of hemorrhage)   . Esophageal reflux   . History of colonic polyps 06/05/05   hyperplastic  . Internal hemorrhoids   . Irritable bowel syndrome   . Ischemic colitis ([place])   . Malignant neoplasm of kidney and other and unspecified urinary organs    renal  carcinoma, left nephrectomy in 2008-per patient.  . Other and unspecified hyperlipidemia   . Personal history of venous thrombosis and embolism    PTE after nephrectomy  . Spinal stenosis   . Type II or unspecified type diabetes mellitus without mention of complication, not stated as uncontrolled 02/05/2012   pt states it was in 2007  . Unspecified essential hypertension      Social History   Socioeconomic History  . Marital status: Married    Spouse name: Not on file  . Number of children: Not on file  . Years of education: Not on file  . Highest education level: Not on file  Occupational History  . Not on file  Social Needs  . Financial resource strain: Not on file  . Food insecurity    Worry: Not on file    Inability: Not on file  . Transportation needs    Medical: Not on file    Non-medical: Not on file  Tobacco Use  . Smoking status: Never Smoker  . Smokeless tobacco: Never Used  Substance and Sexual Activity  . Alcohol use: No  . Drug use: No  . Sexual activity: Not Currently  Lifestyle  . Physical activity    Days per week: Not on file    Minutes per session: Not on file  . Stress: Not on file  Relationships  . Social Herbalist on phone:  Not on file    Gets together: Not on file    Attends religious service: Not on file    Active member of club or organization: Not on file    Attends meetings of clubs or organizations: Not on file    Relationship status: Not on file  . Intimate partner violence    Fear of current or ex partner: Not on file    Emotionally abused: Not on file    Physically abused: Not on file    Forced sexual activity: Not on file  Other Topics Concern  . Not on file  Social History Narrative  . Not on file    Past Surgical History:  Procedure Laterality Date  . BREAST BIOPSY    . COLONOSCOPY W/ POLYPECTOMY  2000  . COLONOSCOPY W/ POLYPECTOMY  07/2004   Hyperplastic polyps  . DILATION AND CURETTAGE OF UTERUS  03/2005    Uterine Polyps  . NEPHRECTOMY  2007   right,Dr Dahlstedt  . SEPTOPLASTY    . TONSILLECTOMY      Family History  Problem Relation Age of Onset  . Stroke Mother        onset in 70s  . Diabetes Mother   . Cancer Mother        bladder; nephrectomy for calculi/also uterine , TAH & BSO  . Aneurysm Mother        thoracic  . Depression Mother   . Stroke Brother   . Heart failure Brother   . Heart attack Other        maternal family history  . Heart failure Maternal Grandfather   . Lung cancer Father        smoked  . Heart failure Sister   . Depression Sister   . Alzheimer's disease Sister   . COPD Sister   . Aneurysm Brother        cns  . Depression Maternal Aunt        X2  . Bipolar disorder Sister   . Alcoholism Neg Hx     Allergies  Allergen Reactions  . Lisinopril     Cough D/Ced by Dr Melvyn Novas  . Codeine     nausea  . Verapamil     Pain in feet    Current Outpatient Medications on File Prior to Visit  Medication Sig Dispense Refill  . albuterol (PROAIR HFA) 108 (90 Base) MCG/ACT inhaler Inhale 1-2 puffs into the lungs every 6 (six) hours as needed for wheezing or shortness of breath. 8.5 Inhaler 5  . Ascorbic Acid (VITAMIN C) POWD Take 1 packet by mouth 2 (two) times daily as needed (cold symptoms).    Marland Kitchen aspirin EC 81 MG tablet Take 162 mg by mouth daily.    . Benzalkonium Chloride (DIABETIC BASICS HEALTHY FOOT EX) Apply 1 application topically daily as needed (foot pain). OTC diabetic foot cream    . Blood Glucose Monitoring Suppl (TRUERESULT BLOOD GLUCOSE) W/DEVICE KIT Use to test blood sugar ICD 10 E11 9 1 each 0  . buPROPion (WELLBUTRIN) 100 MG tablet Take 1 tablet (100 mg total) by mouth 2 (two) times daily. 180 tablet 3  . busPIRone (BUSPAR) 7.5 MG tablet TAKE 1 TABLET BY MOUTH(7.5 MG TOTAL) TWICE A DAY MAY INCREASE TO 2 TABLETS TWICE A DAY IF NEEDED 120 tablet 6  . CALCIUM PO Take 1 tablet by mouth daily.    . Cholecalciferol (VITAMIN D PO) Take 1 tablet by  mouth daily.    Marland Kitchen  CRANBERRY PO Take 2 capsules by mouth 2 (two) times daily.     Marland Kitchen ESTRACE VAGINAL 0.1 MG/GM vaginal cream Apply locally every other night at bedtime  1  . fluticasone (FLONASE) 50 MCG/ACT nasal spray INSTILL 2 SPRAYS INTO EACH NOSTRIL ONCE DAILY 48 g 3  . gabapentin (NEURONTIN) 100 MG capsule TAKE 1 CAPSULE BY MOUTH FOUR TIMES A DAY 120 capsule 6  . glucose blood (TRUETEST TEST) test strip Use to test blood sugar ICD 10 E11 9 100 each 3  . hydrocortisone 2.5 % cream Apply topically 2 (two) times daily. 60 g 0  . Multiple Vitamins-Minerals (EYE VITAMINS PO) Take by mouth.    . polyethylene glycol powder (GLYCOLAX/MIRALAX) powder Take 0.5 Containers by mouth daily as needed for mild constipation. Use as needed    . SYMBICORT 80-4.5 MCG/ACT inhaler INHALE 2 PUFFS BY MOUTH TWICE A DAY 10.2 g 3  . traMADol (ULTRAM) 50 MG tablet Take 1 every 6 hours as needed by pain.  May occasionally take 2 pills if needed. This is a 30 day supply 130 tablet 2  . triamcinolone (KENALOG) 0.025 % cream APPLY 1 APPLICATION TOPICALLY TWICE DAILY AS NEEDED FOR DRY SKIN ON BACK 80 g 1  . TRUEPLUS LANCETS 28G MISC Use to test blood sugar ICD 10 E11 9 100 each 3  . valsartan (DIOVAN) 40 MG tablet Take 1.5 (30m) tablets daily for BP 135 tablet 3   No current facility-administered medications on file prior to visit.     There were no vitals taken for this visit.   Observations/Objective:  Gen: Awake, alert, no acute distress Resp: Breathing is even and non-labored Psych: calm/pleasant demeanor Neuro: Alert and Oriented x 3, speech is clear.   Assessment and Plan:  Tick bite- now having complaint of arthralgia.  ? Osteoarthritis versus less likely secondary to Lyme disease.  Will check for Lyme and RMSF.  Plan to treat with abx if positive.   Follow Up Instructions:    I discussed the assessment and treatment plan with the patient. The patient was provided an opportunity to ask questions and  all were answered. The patient agreed with the plan and demonstrated an understanding of the instructions.   The patient was advised to call back or seek an in-person evaluation if the symptoms worsen or if the condition fails to improve as anticipated.  I provided 11 minutes of non-face-to-face time during this encounter.   MNance Pear NP

## 2019-02-15 NOTE — Addendum Note (Signed)
Addended by: Caffie Pinto on: 02/15/2019 12:39 PM   Modules accepted: Orders

## 2019-02-15 NOTE — Progress Notes (Signed)
Patient cancelled visit same day

## 2019-02-16 LAB — LYME AB/WESTERN BLOT REFLEX
LYME DISEASE AB, QUANT, IGM: <0.8 index (ref 0.00–0.79)
Lyme IgG/IgM Ab: <0.91 {ISR} (ref 0.00–0.90)

## 2019-02-17 ENCOUNTER — Telehealth: Payer: Self-pay | Admitting: Family

## 2019-02-17 LAB — ROCKY MTN SPOTTED FVR ABS PNL(IGG+IGM)
RMSF IgG: NOT DETECTED
RMSF IgM: NOT DETECTED

## 2019-02-17 NOTE — Telephone Encounter (Signed)
Please let pt know that I checked her for [facility] mountain spotted fever and Lyme disease. Both tests are negative. If joint pain persists I recommend a face to face visit with PCP.

## 2019-02-18 ENCOUNTER — Other Ambulatory Visit: Payer: Self-pay | Admitting: Family Medicine

## 2019-02-18 DIAGNOSIS — L853 Xerosis cutis: Secondary | ICD-10-CM

## 2019-02-18 NOTE — Telephone Encounter (Signed)
Patient advised of results and provider's advise 

## 2019-02-19 ENCOUNTER — Encounter: Payer: Self-pay | Admitting: Family Medicine

## 2019-02-19 ENCOUNTER — Telehealth: Payer: Self-pay | Admitting: Family Medicine

## 2019-02-19 LAB — SEDIMENTATION RATE: SED RATE BY MODIFIED WESTERGREN,MANUAL: 14

## 2019-02-19 LAB — LATEX, IGE: Latex: <10

## 2019-02-19 LAB — URIC ACID: Uric Acid: 5.7

## 2019-02-19 MED ORDER — HYDROCODONE-ACETAMINOPHEN 5-325 MG PO TABS: 1.0000 | ORAL_TABLET | Freq: Four times a day (QID) | ORAL | 0 refills | Status: DC | PRN

## 2019-02-19 NOTE — Telephone Encounter (Signed)
Medication: --acetaminophen (NORCO/VICODIN) 5-325 MG per tablet [[id number]]  DISCONTINUED  Patient states that the othropedic doctor is unable to see her until August.  Has the patient contacted their pharmacy? Yes (Agent: If no, request that the patient contact the pharmacy for the refill.) (Agent: If yes, when and what did the pharmacy advise?)  Preferred Pharmacy (with phone number or street name): [id number] [address], [place] [phone] (Phone) [phone] (Fax)    Agent: Please be advised that RX refills may take up to 3 business days. We ask that you follow-up with your pharmacy.

## 2019-02-19 NOTE — Telephone Encounter (Signed)
Please advise 

## 2019-02-25 DIAGNOSIS — L308 Other specified dermatitis: Secondary | ICD-10-CM | POA: Diagnosis not present

## 2019-02-25 DIAGNOSIS — L931 Subacute cutaneous lupus erythematosus: Secondary | ICD-10-CM | POA: Diagnosis not present

## 2019-02-25 DIAGNOSIS — L309 Dermatitis, unspecified: Secondary | ICD-10-CM | POA: Diagnosis not present

## 2019-02-25 DIAGNOSIS — L821 Other seborrheic keratosis: Secondary | ICD-10-CM | POA: Diagnosis not present

## 2019-03-04 DIAGNOSIS — Z4802 Encounter for removal of sutures: Secondary | ICD-10-CM | POA: Diagnosis not present

## 2019-03-04 DIAGNOSIS — M5136 Other intervertebral disc degeneration, lumbar region: Secondary | ICD-10-CM | POA: Diagnosis not present

## 2019-03-09 ENCOUNTER — Other Ambulatory Visit: Payer: Self-pay | Admitting: Family Medicine

## 2019-03-09 MED ORDER — HYDROCODONE-ACETAMINOPHEN 5-325 MG PO TABS: 1.0000 | ORAL_TABLET | Freq: Four times a day (QID) | ORAL | 0 refills | Status: DC | PRN

## 2019-03-09 NOTE — Telephone Encounter (Signed)
Patient needs appointment.

## 2019-03-09 NOTE — Telephone Encounter (Signed)
Can you please call her and try to get more details.  I am not sure what she means- probably prednisone?  Thank you

## 2019-03-09 NOTE — Addendum Note (Signed)
Addended by: Lamar Blinks C on: 03/09/2019 03:44 PM   Modules accepted: Orders

## 2019-03-09 NOTE — Telephone Encounter (Signed)
Called pt to get more info- she had an epidural steroid injection per Dr. Nelva Bush last week.  She was "told to take some more of that hydrocortisone for pain" if needed.  On discussion I think she meant hydrocodone- will send in a few more for her to use as needed

## 2019-03-09 NOTE — Telephone Encounter (Signed)
Copied from [place] [id number]. Topic: Quick Communication - Rx Refill/Question >> Mar 09, 2019  9:37 AM Erick Blinks wrote: Medication: Pt called requesting Cortizone pills, she says that her backs hurts so bad that she cannot walk. She is requesting Cortizone   Has the patient contacted their pharmacy? Yes.   (Agent: If no, request that the patient contact the pharmacy for the refill.) (Agent: If yes, when and what did the pharmacy advise?)  Preferred Pharmacy (with phone number or street name): [id number] [address], [place] [address] [place] [address] [place] [place] [address] Phone: [phone] Fax: [phone]    Agent: Please be advised that RX refills may take up to 3 business days. We ask that you follow-up with your pharmacy.

## 2019-03-16 ENCOUNTER — Telehealth: Payer: Self-pay | Admitting: Family Medicine

## 2019-03-16 MED ORDER — ACETAMINOPHEN-CODEINE #3 300-30 MG PO TABS: 1.0000 | ORAL_TABLET | Freq: Three times a day (TID) | ORAL | 0 refills | Status: DC | PRN

## 2019-03-16 NOTE — Telephone Encounter (Signed)
Pt called in and stated that she only has 2  More theHYDROcodone-acetaminophen (NORCO/VICODIN) 5-325 MG tablet [[id number]]  Left. She would like to get a refill on this med but would like more then 15 pills at a time.  She stated that here back is really hurting and she is having trouble with it  [place] on [address]

## 2019-03-16 NOTE — Telephone Encounter (Signed)
She is getting spine injections from [facility] They first wrote her for hydrocodone which I would not have preferred to start; explained to pt that I am not comfortable continuing to rx this med unless we have no alternatives.  She does not feel that tramadol is controlling her pain Will have her try tylenol #3- she will let me know how this works for her  Pt states she is not actually allergic to codeine- it just make her sick to her stomach once   Meds ordered this encounter  Medications  . acetaminophen-codeine (TYLENOL #3) 300-30 MG tablet    Sig: Take 1-2 tablets by mouth every 8 (eight) hours as needed for moderate pain.    Dispense:  30 tablet    Refill:  0

## 2019-03-23 ENCOUNTER — Telehealth: Payer: Self-pay | Admitting: Family Medicine

## 2019-03-23 NOTE — Telephone Encounter (Signed)
Called her back- no answer at home or cell LMOM on her cell phone   03/16/2019  1   03/16/2019  Acetaminophen-Cod #3 Tablet  30.00 5 Je Cop   [id number]   [facility] ([id number])   0  27.00 MME  Comm Ins   [place]  03/09/2019  1   03/09/2019  Hydrocodone-Acetamin 5-325 MG  10.00 2 Je Cop   [id number]   Wal ([id number])   0  25.00 MME  Comm Ins   [place]  02/19/2019  1   02/19/2019  Hydrocodone-Acetamin 5-325 MG  8.00 2 Je Cop   [id number]   [facility] ([id number])   0  20.00 MME  Comm Ins   [place]  02/08/2019  1   02/08/2019  Hydrocodone-Acetamin 5-325 MG  15.00 3 Je Cop   [id number]   Wal ([id number])   0  25.00 MME  Comm Ins   [place]  01/21/2019  1   11/25/2018  Tramadol Hcl 50 MG Tablet  120.00 30 Je Cop   [id number]   [facility] ([id number])   0  20.00 MME  Comm Ins   [place]  01/15/2019  1   01/15/2019  Hydrocodone-Acetamin 5-325 MG  15.00 3 Ca Kno   [id number]   [facility] ([id number])   0  25.00 MME  Comm Ins   [place]

## 2019-03-23 NOTE — Telephone Encounter (Signed)
Please advise 

## 2019-03-23 NOTE — Telephone Encounter (Signed)
Pt called stating the medication,acetaminophen-codeine (TYLENOL #3) 300-30 MG tablet,   is not working for her. Pt states it has made her constipated, sick on her stomach, and make her eyes more dry. Pt is requesting a call back today if possible as she has no pain medication. Please advise.   [facility] RE:[id number] [address], [place]  [address] [place] [address]  Phone: [phone] Fax: [phone]  Not a 24 hour pharmacy; exact hours not known.

## 2019-03-24 ENCOUNTER — Encounter: Payer: Self-pay | Admitting: Family Medicine

## 2019-03-24 MED ORDER — HYDROCODONE-ACETAMINOPHEN 5-325 MG PO TABS: 1.0000 | ORAL_TABLET | Freq: Four times a day (QID) | ORAL | 0 refills | Status: DC | PRN

## 2019-03-24 NOTE — Telephone Encounter (Signed)
Called again, still no answer.  Left another message I will send in a few more hydrocodone if this is all that seems to control her pain.  However I do not think this is a great long-term option for her.  Please do not use when driving.  If she plans to continue this medication will need to have her come in for a pain contract and drug screen  Meds ordered this encounter  Medications  . HYDROcodone-acetaminophen (NORCO/VICODIN) 5-325 MG tablet    Sig: Take 1 tablet by mouth every 6 (six) hours as needed. Do not take with tramadol or any other sedative    Dispense:  10 tablet    Refill:  0

## 2019-03-24 NOTE — Telephone Encounter (Signed)
Called again- home numb no answer, no machine Called cell- LMOM again- perhaps easier to communicate via MyChart?  I will send her a message that she can respond to

## 2019-03-24 NOTE — Telephone Encounter (Signed)
Pt asked for Hydrocodone due to the tylenol three not working and the first shot she got didn't help. Pt would like a call from Dr copland

## 2019-03-24 NOTE — Telephone Encounter (Signed)
Pt stated the tylenol made her sick and she broke out. Pt need hydrocodone if possible. Please advise

## 2019-03-24 NOTE — Addendum Note (Signed)
Addended by: Lamar Blinks C on: 03/24/2019 03:57 PM   Modules accepted: Orders

## 2019-03-29 ENCOUNTER — Other Ambulatory Visit: Payer: Self-pay | Admitting: Family Medicine

## 2019-03-29 DIAGNOSIS — I10 Essential (primary) hypertension: Secondary | ICD-10-CM

## 2019-03-29 NOTE — Telephone Encounter (Signed)
Please advise 

## 2019-03-29 NOTE — Telephone Encounter (Signed)
Pt request refill  valsartan (DIOVAN) 40 MG tablet  Her old Rx states take 1.5 a day.  But the dr has increased her pills to 2.5 a day.  So now pt has run out early. Please send to  [facility][id number] [name], [place] [phone] (Phone) [phone] (Fax)

## 2019-03-29 NOTE — Telephone Encounter (Signed)
She is supposed to be taking valsartan 40 mg, 1.5 tablets (60mg ) daily- this is what her rx is written as.  Can you please call her and find out how much valsartan she has been taking?  She may have gotten confused about her dose   I made this change back in May of this year   valsartan (DIOVAN) 40 MG tablet     Sig: Take 1.5 (60mg ) tablets daily for BP    Dispense:  135 tablet

## 2019-03-31 MED ORDER — VALSARTAN 40 MG PO TABS: ORAL_TABLET | ORAL | 6 refills | Status: DC

## 2019-03-31 NOTE — Telephone Encounter (Signed)
Patient reports she is taking 2.5 of the Valsartan 40 mg because "she needs to keep her bp under 140/80 and 1.5 was not doing that" She also wanted to know if she can get more Hyrocodone until she gets a shot she is suppose to have for her back pain.

## 2019-03-31 NOTE — Telephone Encounter (Signed)
Called patient, husband picked up and stated that patient is sleeping and will call back when she wakes up.

## 2019-03-31 NOTE — Addendum Note (Signed)
Addended by: Lamar Blinks C on: 03/31/2019 08:31 PM   Modules accepted: Orders

## 2019-03-31 NOTE — Telephone Encounter (Signed)
Please call pt.  I will need to see her for an office visit prior to rx any further narcotics.  We need to set up a controlled substance contract.  She can also ask her ortho office to refill as they are the ones who started this medication I did refill her valsartan as requested  Meds ordered this encounter  Medications  . valsartan (DIOVAN) 40 MG tablet    Sig: Take 2.5 tablets (100 mg) once daily for blood pressure    Dispense:  225 tablet    Refill:  6

## 2019-04-08 ENCOUNTER — Other Ambulatory Visit: Payer: Self-pay | Admitting: Internal Medicine

## 2019-04-08 DIAGNOSIS — F329 Major depressive disorder, single episode, unspecified: Secondary | ICD-10-CM

## 2019-04-08 DIAGNOSIS — F32A Depression, unspecified: Secondary | ICD-10-CM

## 2019-04-09 ENCOUNTER — Telehealth: Payer: Self-pay | Admitting: Family Medicine

## 2019-04-09 MED ORDER — HYDROCODONE-ACETAMINOPHEN 5-325 MG PO TABS: 1.0000 | ORAL_TABLET | Freq: Four times a day (QID) | ORAL | 0 refills | Status: DC | PRN

## 2019-04-09 NOTE — Telephone Encounter (Signed)
Pt calling back to check status.

## 2019-04-09 NOTE — Telephone Encounter (Signed)
Medication: HYDROcodone-acetaminophen (NORCO/VICODIN) 5-325 MG tablet [id number]  Would like about 15  Would need enough just until she has her shot on 04/20/19.   Pharmacy:  [facility] [phone] [address], [place] [phone] (Phone) [phone] (Fax)

## 2019-04-09 NOTE — Telephone Encounter (Signed)
Patient aware she needs contract and that ortho should handle medication since they were the ones prescribing it she states she just needs enough until her appointment on 04/20/2019. Please advise.

## 2019-04-20 DIAGNOSIS — M5136 Other intervertebral disc degeneration, lumbar region: Secondary | ICD-10-CM | POA: Diagnosis not present

## 2019-04-26 ENCOUNTER — Telehealth: Payer: Self-pay

## 2019-04-26 ENCOUNTER — Telehealth: Payer: Self-pay | Admitting: Family Medicine

## 2019-04-26 NOTE — Telephone Encounter (Signed)
Copied from [place] [phone]. Topic: General - Other >> Apr 26, 2019  2:15 PM Rainey Pines A wrote: Patient is requesting a callback from Dr. Serita Grit nurse in regards to her hydrocodone medication

## 2019-04-26 NOTE — Telephone Encounter (Signed)
Copied from [place] [id number]. Topic: Quick Communication - Rx Refill/Question >> Apr 26, 2019  2:12 PM Alexa [name], [place] A wrote: Medication: HYDROcodone-acetaminophen (NORCO/VICODIN) 5-325 MG tablet (Patient stated that her pain is getting worse and she needs a 30 day supply of medication.)  Has the patient contacted their pharmacy?Yes (Agent: If no, request that the patient contact the pharmacy for the refill.) (Agent: If yes, when and what did the pharmacy advise?)Contact PCP  Preferred Pharmacy (with phone number or street name): [facility] RO:[id number] [address], [place] [phone] (Phone) [phone] (Fax)    Agent: Please be advised that RX refills may take up to 3 business days. We ask that you follow-up with your pharmacy.

## 2019-04-26 NOTE — Telephone Encounter (Signed)
Patient to contact ortho for further refills. Patient will do so and let us know if she has any issues she will schedule an appointment with pcp.

## 2019-04-27 ENCOUNTER — Telehealth: Payer: Self-pay

## 2019-04-27 NOTE — Telephone Encounter (Signed)
Copied from [place] [id number]. Topic: General - Other >> Apr 27, 2019 12:46 PM Keene Breath wrote: Reason for CRM: Patient called to ask the nurse to call her regarding the pain in her back.  She wanted to be seen asap, tried the office, but no answer.  Patient would like some advice and does not want to go to the hospital.  Please call patient to discuss.  CB# [phone]

## 2019-04-27 NOTE — Telephone Encounter (Signed)
Maudie Mercury- can you help w/ this please?

## 2019-04-27 NOTE — Telephone Encounter (Signed)
Spoke with patient regarding symptoms.  Patient c/o chronic back pain. Has been treated by ortho with injections x 2, patient states they did not work.  Patient reports pain 8 out of 10 on pain scale. She is requesting PCP offer suggestion/next step to alleviate pain. Patient has appointment with PCP on Thursday, 04/29/2019. Advised to go to ER for severe pain, patient verbalized understanding.

## 2019-04-29 ENCOUNTER — Ambulatory Visit: Payer: Medicare HMO | Admitting: Family Medicine

## 2019-04-29 DIAGNOSIS — Z0289 Encounter for other administrative examinations: Secondary | ICD-10-CM

## 2019-04-29 NOTE — Progress Notes (Deleted)
[place] at [facility] [address], [place], [place] [address] [phone]  Date:  04/29/2019   Name:  Alexa [name]   DOB:  Oct 10, 1929   MRN:  [id number]  PCP:  Darreld Mclean, MD    Chief Complaint: No chief complaint on file.   History of Present Illness:  Alexa [name] is a 85 y.o. very pleasant female patient who presents with the following:  Elderly pt with history of Dm, HTN, hyperlipidemia, history of PE She has developed chronic back pain over the last several months She had been maintained on tramadol for chronic pain for some time.  She was then given hydrocodone at an ortho appt earlier this year and since that time has requested several refills of this mediation from me  04/26/2019  1   04/26/2019  Hydrocodone-Acetamin 5-325 MG  15.00  3 Ca Kno   [id number]   Wal (0618)   0  25.00 MME  Comm Ins   [place]  04/09/2019  1   04/09/2019  Hydrocodone-Acetamin 5-325 MG  15.00  3 Je Cop   [id number]   Wal (0618)   0  25.00 MME  Comm Ins   [place]  04/06/2019  1   12/16/2018  Tramadol Hcl 50 MG Tablet  130.00  30 Je Cop   [id number]   Wal (0618)   0  21.67 MME  Comm Ins   [place]  03/24/2019  1   03/24/2019  Hydrocodone-Acetamin 5-325 MG  10.00  2 Je Cop   [id number]   Wal (0618)   0  25.00 MME  Comm Ins   [place]  03/16/2019  1   03/16/2019  Acetaminophen-Cod #3 Tablet  30.00  5 Je Cop   [id number]   Wal (0618)   0  27.00 MME  Comm Ins   [place]  03/09/2019  1   03/09/2019  Hydrocodone-Acetamin 5-325 MG  10.00  2 Je Cop   [id number]   Wal (0618)   0  25.00 MME  Comm Ins   [place]  02/19/2019  1   02/19/2019  Hydrocodone-Acetamin 5-325 MG  8.00  2 Je Cop   [id number]   Wal (0618)   0  20.00 MME  Comm Ins   [place]  02/08/2019  1   02/08/2019  Hydrocodone-Acetamin 5-325 MG  15.00  3 Je Cop   [id number]   Wal (0618)   0  25.00 MME  Comm Ins   [place]  01/21/2019  1   11/25/2018  Tramadol Hcl 50 MG Tablet  120.00  30 Je Cop   [id number]   Wal (0618)   0  20.00 MME  Comm Ins   [place]  01/15/2019  1    01/15/2019  Hydrocodone-Acetamin 5-325 MG  15.00  3 Ca Kno   [id number]   Wal (0618)   0  25.00 MME  Comm Ins   [place]  12/23/2018  1   11/25/2018  Tramadol Hcl 50 MG Tablet  120.00  30 Je Cop   [id number]   Wal (0618)   1  20.00 MME  Comm Ins   [place]  11/25/2018  1   11/25/2018  Tramadol Hcl 50 MG Tablet  120.00  30 Je Cop   [id number]   Wal (0618)   0  20.00 MME  Comm Ins   [place]  10/25/2018  1   08/26/2018  Tramadol Hcl 50 MG Tablet  120.00  [place]   [id number]   Wal [phone]   1  20.00 MME  Comm Ins   [place]  09/23/2018  1   08/26/2018  Tramadol Hcl 50 MG Tablet  120.00  30 Je Cop   [id number]   Wal (0618)   0  20.00 MME  Comm Ins   [place]  08/25/2018  1   07/28/2018  Tramadol Hcl 50 MG Tablet  120.00  30 Je Cop   [id number]   Wal (0618)   1  20.00 MME  Comm Ins   [place]  07/28/2018  1   07/28/2018  Tramadol Hcl 50 MG Tablet  120.00  30 Je Cop   [id number]   Wal (0618)   0  20.00 MME  Comm Ins   [place]  06/26/2018  1   04/27/2018  Tramadol Hcl 50 MG Tablet  120.00  30 Je Cop   [id number]   Wal (0618)   1  20.00 MME  Comm Ins   [place]  05/29/2018  1   04/27/2018  Tramadol Hcl 50 MG Tablet  120.00  30 Je Cop   [id number]   Wal (0618)   0  20.00 MME  Comm Ins   [place]  04/27/2018  1   04/27/2018  Tramadol Hcl 50 MG Tablet  120.00  30 Je Cop   [id number]   Wal (0618)   0  20.00 MME  Comm Ins   [place]  03/25/2018  1   01/20/2018  Tramadol Hcl 50 MG Tablet  120.00  30 Je Cop   [id number]   Wal (0618)   2  20.00 MME  Comm Ins   [place]  02/23/2018  1   01/20/2018  Tramadol Hcl 50 MG Tablet  120.00  30 Je Cop   [id number]   Wal (0618)   1  20.00 MME  Comm Ins   [employer]  01/20/2018  1   01/20/2018  Tramadol Hcl 50 MG Tablet  120.00  30 Je Cop   [id number]   Wal (0618)   0  20.00 MME  Comm Ins   [place]  12/21/2017  1   12/16/2017  Tramadol Hcl 50 MG Tablet  120.00  30 Je Cop   [id number]   Wal (0618)   0  20.00 MME  Comm Ins   [place]  11/26/2017  1   11/18/2017  Acetaminophen-Cod #3 Tablet  20.00  3 [place]   [id number]   Wal (0618)   0  30.00 MME  Comm Ins   [place]  11/21/2017  1   09/23/2017  Tramadol Hcl 50 MG Tablet  120.00  30 Donald Siva   [id number]   Wal [phone]   1  20.00 MME  Comm Ins   [place]  10/22/2017  1   09/23/2017  Tramadol Hcl 50 MG Tablet  120.00  30 Donald Siva   [id number]   Wal [phone]   0  20.00 MME  Comm Ins   [place]  09/23/2017  1   09/23/2017  Tramadol Hcl 50 MG Tablet  120.00  30 Donald Siva   [id number]   Wal [phone]   0  20.00 MME  Medicare   [place]  08/22/2017  1   06/25/2017  Tramadol Hcl 50 MG Tablet  120.00  30 Je Cop   [id number]   Wal (0618)   2  20.00 MME  Medicare   [place]  07/24/2017  1   06/25/2017  Tramadol Hcl 50  MG Tablet  120.00  30 Je Cop   [id number]   Wal [phone]   1  20.00 MME  Medicare   [place]  06/25/2017  1   06/25/2017  Tramadol Hcl 50 MG Tablet  120.00  30 Je Cop   [id number]   Wal (0618)   0  20.00 MME  Medicare   [place]  05/21/2017  1   12/12/2016  Tramadol Hcl 50 MG Tablet  90.00  30 Je Cop   [id number]   Wal (0618)   3  15.00 MME       Flu shot Pneumonia   Patient Active Problem List   Diagnosis Date Noted  . Solitary kidney 12/15/2018  . Cough variant asthma 12/29/2014  . Spinal stenosis in cervical region 12/13/2014  . Radiculopathy, lumbar region 12/13/2014  . Depression 05/03/2008  . PERONEAL NEUROPATHY 05/03/2008  . Malignant neoplasm of kidney excluding renal pelvis ([place]) 08/06/2007  . Controlled type 2 diabetes mellitus without complication, without long-term current use of insulin ([place]) 08/06/2007  . GERD 08/06/2007  . PULMONARY EMBOLISM, HX OF 08/06/2007  . Hyperlipidemia 09/03/2006  . Essential hypertension 09/03/2006  . IBS 09/03/2006    Past Medical History:  Diagnosis Date  . Asthma   . Chronic obstructive pulmonary disease ([place])   . Diverticulosis   . Diverticulosis of colon (without mention of hemorrhage)   . Esophageal reflux   . History of colonic polyps 06/05/05   hyperplastic  . Internal hemorrhoids   . Irritable bowel syndrome   . Ischemic colitis ([place])   . Malignant neoplasm of kidney and other and unspecified urinary organs    renal carcinoma, left nephrectomy in 2008-per patient.  . Other and  unspecified hyperlipidemia   . Personal history of venous thrombosis and embolism    PTE after nephrectomy  . Spinal stenosis   . Type II or unspecified type diabetes mellitus without mention of complication, not stated as uncontrolled 02/05/2012   pt states it was in 2007  . Unspecified essential hypertension     Past Surgical History:  Procedure Laterality Date  . BREAST BIOPSY    . COLONOSCOPY W/ POLYPECTOMY  2000  . COLONOSCOPY W/ POLYPECTOMY  07/2004   Hyperplastic polyps  . DILATION AND CURETTAGE OF UTERUS  03/2005   Uterine Polyps  . NEPHRECTOMY  2007   right,Dr Dahlstedt  . SEPTOPLASTY    . TONSILLECTOMY      Social History   Tobacco Use  . Smoking status: Never Smoker  . Smokeless tobacco: Never Used  Substance Use Topics  . Alcohol use: No  . Drug use: No    Family History  Problem Relation Age of Onset  . Stroke Mother        onset in 19s  . Diabetes Mother   . Cancer Mother        bladder; nephrectomy for calculi/also uterine , TAH & BSO  . Aneurysm Mother        thoracic  . Depression Mother   . Stroke Brother   . Heart failure Brother   . Heart attack Other        maternal family history  . Heart failure Maternal Grandfather   . Lung cancer Father        smoked  . Heart failure Sister   . Depression Sister   . Alzheimer's disease Sister   . COPD Sister   . Aneurysm Brother        cns  .  Depression Maternal Aunt        X2  . Bipolar disorder Sister   . Alcoholism Neg Hx     Allergies  Allergen Reactions  . Lisinopril     Cough D/Ced by Dr Melvyn Novas  . Codeine     nausea  . Verapamil     Pain in feet    Medication list has been reviewed and updated.  Current Outpatient Medications on File Prior to Visit  Medication Sig Dispense Refill  . albuterol (PROAIR HFA) 108 (90 Base) MCG/ACT inhaler Inhale 1-2 puffs into the lungs every 6 (six) hours as needed for wheezing or shortness of breath. 8.5 Inhaler 5  . Ascorbic Acid (VITAMIN C)  POWD Take 1 packet by mouth 2 (two) times daily as needed (cold symptoms).    Marland Kitchen aspirin EC 81 MG tablet Take 162 mg by mouth daily.    . Benzalkonium Chloride (DIABETIC BASICS HEALTHY FOOT EX) Apply 1 application topically daily as needed (foot pain). OTC diabetic foot cream    . Blood Glucose Monitoring Suppl (TRUERESULT BLOOD GLUCOSE) W/DEVICE KIT Use to test blood sugar ICD 10 E11 9 1 each 0  . buPROPion (WELLBUTRIN) 100 MG tablet TAKE 1 TABLET(100 MG) BY MOUTH TWICE DAILY 60 tablet 5  . busPIRone (BUSPAR) 7.5 MG tablet TAKE 1 TABLET BY MOUTH(7.5 MG TOTAL) TWICE A DAY MAY INCREASE TO 2 TABLETS TWICE A DAY IF NEEDED 120 tablet 6  . CALCIUM PO Take 1 tablet by mouth daily.    . Cholecalciferol (VITAMIN D PO) Take 1 tablet by mouth daily.    Marland Kitchen CRANBERRY PO Take 2 capsules by mouth 2 (two) times daily.     Marland Kitchen ESTRACE VAGINAL 0.1 MG/GM vaginal cream Apply locally every other night at bedtime  1  . fluticasone (FLONASE) 50 MCG/ACT nasal spray INSTILL 2 SPRAYS INTO EACH NOSTRIL ONCE DAILY 48 g 3  . gabapentin (NEURONTIN) 100 MG capsule TAKE 1 CAPSULE BY MOUTH FOUR TIMES A DAY 120 capsule 6  . glucose blood (TRUETEST TEST) test strip Use to test blood sugar ICD 10 E11 9 100 each 3  . HYDROcodone-acetaminophen (NORCO/VICODIN) 5-325 MG tablet Take 1 tablet by mouth every 6 (six) hours as needed. Do not take with tramadol or any other sedative 15 tablet 0  . hydrocortisone 2.5 % cream Apply topically 2 (two) times daily. 60 g 0  . Multiple Vitamins-Minerals (EYE VITAMINS PO) Take by mouth.    . polyethylene glycol powder (GLYCOLAX/MIRALAX) powder Take 0.5 Containers by mouth daily as needed for mild constipation. Use as needed    . SYMBICORT 80-4.5 MCG/ACT inhaler INHALE 2 PUFFS BY MOUTH TWICE A DAY 10.2 g 3  . traMADol (ULTRAM) 50 MG tablet Take 1 every 6 hours as needed by pain.  May occasionally take 2 pills if needed. This is a 30 day supply 130 tablet 2  . triamcinolone (KENALOG) 0.025 % cream APPLY 1  APPLICATION TOPICALLY TWICE DAILY AS NEEDED FOR DRY SKIN ON BACK 80 g 1  . TRUEPLUS LANCETS 28G MISC Use to test blood sugar ICD 10 E11 9 100 each 3  . valsartan (DIOVAN) 40 MG tablet Take 2.5 tablets (100 mg) once daily for blood pressure 225 tablet 6   No current facility-administered medications on file prior to visit.     Review of Systems:  As per HPI- otherwise negative.   Physical Examination: There were no vitals filed for this visit. There were no vitals filed for this  visit. There is no height or weight on file to calculate BMI. Ideal Body Weight:    GEN: WDWN, NAD, Non-toxic, A & O x 3 HEENT: Atraumatic, Normocephalic. Neck supple. No masses, No LAD. Ears and Nose: No external deformity. CV: RRR, No M/G/R. No JVD. No thrill. No extra heart sounds. PULM: CTA B, no wheezes, crackles, rhonchi. No retractions. No resp. distress. No accessory muscle use. ABD: S, NT, ND, +BS. No rebound. No HSM. EXTR: No c/c/e NEURO Normal gait.  PSYCH: Normally interactive. Conversant. Not depressed or anxious appearing.  Calm demeanor.    Assessment and Plan: ***  Signed Lamar Blinks, MD

## 2019-07-07 DIAGNOSIS — M5136 Other intervertebral disc degeneration, lumbar region: Secondary | ICD-10-CM | POA: Diagnosis not present

## 2019-08-09 ENCOUNTER — Telehealth: Payer: Self-pay | Admitting: Family Medicine

## 2019-08-09 NOTE — Telephone Encounter (Signed)
Copied from [place] [id number]. Topic: General - Other >> Aug 09, 2019  4:35 PM Mcneil, Ja-Kwan wrote: Reason for CRM: Pt called to ask if she should continue taking the gabapentin (NEURONTIN) 100 MG capsule since being prescribed HYDROcodone-acetaminophen (NORCO/VICODIN) 5-325 MG tablet by another provider. Pt requests call back

## 2019-08-10 NOTE — Telephone Encounter (Signed)
Called and LMOM for her- if she has been taking her neurontin on a regular basis, ok to combine with pain medication if needed

## 2019-08-10 NOTE — Telephone Encounter (Signed)
Spoke with patient regarding medications.  Patient states she has not taken Neurontin in over 6 months because was told to stop taking medication, could not recall who advised this. Patient continues to take Hydrocodone every 6 hours.  Patient would like to know if she can restart Neurontin, already has medication at home.

## 2019-08-11 NOTE — Telephone Encounter (Signed)
Called pt- no answer, LMOM I would recommend caution in adding gabapentin to hydrocodone, due to compounded risk of sedation I have not seen her since May 2020, and may not be up-to-date on her most recent health issues.  I asked her to please schedule an in person or virtual visit at her earliest convenience to discuss further

## 2019-08-16 ENCOUNTER — Ambulatory Visit: Payer: Medicare HMO | Admitting: Family Medicine

## 2019-08-18 ENCOUNTER — Other Ambulatory Visit: Payer: Self-pay

## 2019-08-19 ENCOUNTER — Ambulatory Visit: Payer: Medicare HMO | Admitting: Family Medicine

## 2019-08-19 ENCOUNTER — Telehealth: Payer: Self-pay | Admitting: Family Medicine

## 2019-08-19 NOTE — Progress Notes (Deleted)
[place] at [facility] [address], [place], [place] [address] [phone]  Date:  08/19/2019   Name:  Alexa [name]   DOB:  1929/10/24   MRN:  [id number]  PCP:  Darreld Mclean, MD    Chief Complaint: No chief complaint on file.   History of Present Illness:  Alexa [name] is a 86 y.o. very pleasant female patient who presents with the following:  Here today for medication follow-up visit History of diabetes, hypertension, hyperlipidemia, solitary kidney status post nephrectomy in 2007 due to renal carcinoma, cervical spine stenosis, neuropathy  Last seen by myself in May 2020-that time her blood pressure was borderline, we increased her valsartan from 40 to 60 mg  She saw Dr. Nelva Bush to discuss her spine issues and pain, most recently in September-orthopedics is now treating her with hydrocodone, previously were using tramadol  Flu vaccine A1c is due Can suggest Shingrix Due for routine labs  Albuterol Aspirin 81 BuSpar Flonase Gabapentin 100 4 times daily Hydrocodone Symbicort Valsartan 60 mg  Not currently on any medication for blood sugar 08/09/2019  1   08/09/2019  Hydrocodone-Acetamin 5-325 MG  70.00  17 Ca Kno   [id number]   Wal (0618)   0  20.59 MME  Medicare   [place]  07/16/2019  1   07/07/2019  Hydrocodone-Acetamin 5-325 MG  70.00  17 Ca Kno   [id number]   Wal (0618)   0  20.59 MME  Medicare   [place]  07/05/2019  1   07/05/2019  Hydrocodone-Acetamin 5-325 MG  30.00  7 Ca Kno   [id number]   Wal (0618)   0  21.43 MME  Comm Ins   [place]  06/21/2019  1   06/21/2019  Hydrocodone-Acetamin 5-325 MG  30.00  7 Ca Kno   [id number]   Wal (0618)   0  21.43 MME  Comm Ins   [place]  06/07/2019  1   06/07/2019  Hydrocodone-Acetamin 5-325 MG  30.00  7 Ca Kno   [id number]   Wal (0618)   0  21.43 MME  Comm Ins   [place]  05/25/2019  1   05/25/2019  Hydrocodone-Acetamin 5-325 MG  30.00  7 Ca Kno   [id number]   Wal (0618)   0  21.43 MME  Comm Ins   [place]  05/14/2019  1    05/14/2019  Diazepam 10 MG Tablet  2.00  1 Ri Ram   [id number]   Wal (0618)   0  2.00 LME  Comm Ins   [place]  05/10/2019  1   05/10/2019  Hydrocodone-Acetamin 5-325 MG  30.00  7 Ri Ram   [id number]   Wal (0618)   0  21.43 MME  Comm Ins   [place]  04/29/2019  1   04/29/2019  Hydrocodone-Acetamin 5-325 MG  15.00  3 Ca Kno   [id number]   Wal (0618)   0  25.00 MME  Comm Ins   [place]  04/26/2019  1   04/26/2019  Hydrocodone-Acetamin 5-325 MG  15.00  3 Ca Kno   [id number]   Wal (0618)   0  25.00 MME  Comm Ins     04/09/2019  1   04/09/2019  Hydrocodone-Acetamin 5-325 MG  15.00  3 Je Cop   [id number]   Wal (0618)   0  25.00 MME  Comm Ins     04/06/2019  1   12/16/2018  Tramadol Hcl 50 MG Tablet  130.00  30 Je Cop   [id number]   Wal (0618)   0  21.67 MME  Comm Ins   [place]  03/24/2019  1   03/24/2019  Hydrocodone-Acetamin 5-325 MG  10.00  2 Je Cop   [id number]   Wal (0618)   0  25.00 MME  Comm Ins   [place]  03/16/2019  1   03/16/2019  Acetaminophen-Cod #3 Tablet  30.00  5 Je Cop   [id number]   Wal (0618)   0  27.00 MME        Patient Active Problem List   Diagnosis Date Noted  . Solitary kidney 12/15/2018  . Cough variant asthma 12/29/2014  . Spinal stenosis in cervical region 12/13/2014  . Radiculopathy, lumbar region 12/13/2014  . Depression 05/03/2008  . PERONEAL NEUROPATHY 05/03/2008  . Malignant neoplasm of kidney excluding renal pelvis ([place]) 08/06/2007  . Controlled type 2 diabetes mellitus without complication, without long-term current use of insulin ([place]) 08/06/2007  . GERD 08/06/2007  . PULMONARY EMBOLISM, HX OF 08/06/2007  . Hyperlipidemia 09/03/2006  . Essential hypertension 09/03/2006  . IBS 09/03/2006    Past Medical History:  Diagnosis Date  . Asthma   . Chronic obstructive pulmonary disease ([place])   . Diverticulosis   . Diverticulosis of colon (without mention of hemorrhage)   . Esophageal reflux   . History of colonic polyps 06/05/05   hyperplastic  . Internal hemorrhoids   . Irritable bowel syndrome   . Ischemic  colitis ([place])   . Malignant neoplasm of kidney and other and unspecified urinary organs    renal carcinoma, left nephrectomy in 2008-per patient.  . Other and unspecified hyperlipidemia   . Personal history of venous thrombosis and embolism    PTE after nephrectomy  . Spinal stenosis   . Type II or unspecified type diabetes mellitus without mention of complication, not stated as uncontrolled 02/05/2012   pt states it was in 2007  . Unspecified essential hypertension     Past Surgical History:  Procedure Laterality Date  . BREAST BIOPSY    . COLONOSCOPY W/ POLYPECTOMY  2000  . COLONOSCOPY W/ POLYPECTOMY  07/2004   Hyperplastic polyps  . DILATION AND CURETTAGE OF UTERUS  03/2005   Uterine Polyps  . NEPHRECTOMY  2007   right,Dr Dahlstedt  . SEPTOPLASTY    . TONSILLECTOMY      Social History   Tobacco Use  . Smoking status: Never Smoker  . Smokeless tobacco: Never Used  Substance Use Topics  . Alcohol use: No  . Drug use: No    Family History  Problem Relation Age of Onset  . Stroke Mother        onset in 11s  . Diabetes Mother   . Cancer Mother        bladder; nephrectomy for calculi/also uterine , TAH & BSO  . Aneurysm Mother        thoracic  . Depression Mother   . Stroke Brother   . Heart failure Brother   . Heart attack Other        maternal family history  . Heart failure Maternal Grandfather   . Lung cancer Father        smoked  . Heart failure Sister   . Depression Sister   . Alzheimer's disease Sister   . COPD Sister   . Aneurysm Brother        cns  . Depression Maternal Aunt  X2  . Bipolar disorder Sister   . Alcoholism Neg Hx     Allergies  Allergen Reactions  . Lisinopril     Cough D/Ced by Dr Melvyn Novas  . Codeine     nausea  . Verapamil     Pain in feet    Medication list has been reviewed and updated.  Current Outpatient Medications on File Prior to Visit  Medication Sig Dispense Refill  . albuterol (PROAIR HFA) 108 (90 Base)  MCG/ACT inhaler Inhale 1-2 puffs into the lungs every 6 (six) hours as needed for wheezing or shortness of breath. 8.5 Inhaler 5  . Ascorbic Acid (VITAMIN C) POWD Take 1 packet by mouth 2 (two) times daily as needed (cold symptoms).    Marland Kitchen aspirin EC 81 MG tablet Take 162 mg by mouth daily.    . Benzalkonium Chloride (DIABETIC BASICS HEALTHY FOOT EX) Apply 1 application topically daily as needed (foot pain). OTC diabetic foot cream    . Blood Glucose Monitoring Suppl (TRUERESULT BLOOD GLUCOSE) W/DEVICE KIT Use to test blood sugar ICD 10 E11 9 1 each 0  . buPROPion (WELLBUTRIN) 100 MG tablet TAKE 1 TABLET(100 MG) BY MOUTH TWICE DAILY 60 tablet 5  . busPIRone (BUSPAR) 7.5 MG tablet TAKE 1 TABLET BY MOUTH(7.5 MG TOTAL) TWICE A DAY MAY INCREASE TO 2 TABLETS TWICE A DAY IF NEEDED 120 tablet 6  . CALCIUM PO Take 1 tablet by mouth daily.    . Cholecalciferol (VITAMIN D PO) Take 1 tablet by mouth daily.    Marland Kitchen CRANBERRY PO Take 2 capsules by mouth 2 (two) times daily.     Marland Kitchen ESTRACE VAGINAL 0.1 MG/GM vaginal cream Apply locally every other night at bedtime  1  . fluticasone (FLONASE) 50 MCG/ACT nasal spray INSTILL 2 SPRAYS INTO EACH NOSTRIL ONCE DAILY 48 g 3  . gabapentin (NEURONTIN) 100 MG capsule TAKE 1 CAPSULE BY MOUTH FOUR TIMES A DAY 120 capsule 6  . glucose blood (TRUETEST TEST) test strip Use to test blood sugar ICD 10 E11 9 100 each 3  . HYDROcodone-acetaminophen (NORCO/VICODIN) 5-325 MG tablet Take 1 tablet by mouth every 6 (six) hours as needed. Do not take with tramadol or any other sedative 15 tablet 0  . Multiple Vitamins-Minerals (EYE VITAMINS PO) Take by mouth.    . polyethylene glycol powder (GLYCOLAX/MIRALAX) powder Take 0.5 Containers by mouth daily as needed for mild constipation. Use as needed    . SYMBICORT 80-4.5 MCG/ACT inhaler INHALE 2 PUFFS BY MOUTH TWICE A DAY 10.2 g 3  . traMADol (ULTRAM) 50 MG tablet Take 1 every 6 hours as needed by pain.  May occasionally take 2 pills if needed.  This is a 30 day supply 130 tablet 2  . triamcinolone (KENALOG) 0.025 % cream APPLY 1 APPLICATION TOPICALLY TWICE DAILY AS NEEDED FOR DRY SKIN ON BACK 80 g 1  . TRUEPLUS LANCETS 28G MISC Use to test blood sugar ICD 10 E11 9 100 each 3  . valsartan (DIOVAN) 40 MG tablet Take 2.5 tablets (100 mg) once daily for blood pressure 225 tablet 6   No current facility-administered medications on file prior to visit.    Review of Systems:  As per HPI- otherwise negative.   Physical Examination: There were no vitals filed for this visit. There were no vitals filed for this visit. There is no height or weight on file to calculate BMI. Ideal Body Weight:    GEN: WDWN, NAD, Non-toxic, A & O x  3 HEENT: Atraumatic, Normocephalic. Neck supple. No masses, No LAD. Ears and Nose: No external deformity. CV: RRR, No M/G/R. No JVD. No thrill. No extra heart sounds. PULM: CTA B, no wheezes, crackles, rhonchi. No retractions. No resp. distress. No accessory muscle use. ABD: S, NT, ND, +BS. No rebound. No HSM. EXTR: No c/c/e NEURO Normal gait.  PSYCH: Normally interactive. Conversant. Not depressed or anxious appearing.  Calm demeanor.    Assessment and Plan: *** This visit occurred during the SARS-CoV-2 public health emergency.  Safety protocols were in place, including screening questions prior to the visit, additional usage of staff PPE, and extensive cleaning of exam room while observing appropriate contact time as indicated for disinfecting solutions.   Signed Lamar Blinks, MD

## 2019-08-19 NOTE — Telephone Encounter (Signed)
No fee

## 2019-08-19 NOTE — Telephone Encounter (Signed)
Pt called in very apologetic that she did not get to her appointment today. She noted being on hold for approximately 20 minutes. She had an accident with her bowels and had to return home. She has rescheduled for next week. Please advise [place] on no show fee.

## 2019-08-19 NOTE — Telephone Encounter (Signed)
Please advise on no show fee?

## 2019-08-24 ENCOUNTER — Other Ambulatory Visit: Payer: Self-pay

## 2019-08-24 NOTE — Progress Notes (Addendum)
[facility] [place], [place], [place] [address] [phone]  Date:  08/25/2019   Name:  Alexa [name]   DOB:  Mar 18, 1930   MRN:  [id number]  PCP:  Darreld Mclean, MD    Chief Complaint: Medication Refill (blood work, urine specimen) and Feet Numbness   History of Present Illness:  Alexa [name] is a 86 y.o. very pleasant female patient who presents with the following:  Here today for follow-up visit- pt came in two hours early requesting to be worked in due to "being in too much pain to drive home"  Today she states she needs help with "everything"  History of hypertension, diabetes, neuropathy, chronic back pain, hyperlipidemia, solitary kidney status post nephrectomy for renal cancer Last seen by myself in May 2020  She notes that her foot numbness is getting worse.  She had a couple of episodes recently where she felt like her feet were too numb for her to drive Her husband no longer drives   Brittani and her husband, who is older than she is, have a lot of responsibility in her family.  One of their granddaughters was killed in a motor vehicle accident and they help out a lot with the great granddaughters.  This may be overly stressful for her   Most recent labs May 2020  She has been seeing Dr. Jeralyn Ruths for her back pain-they have taken over her pain medication prescribing as below  She stopped her tramadol and also her Neurontin last year, perhaps May.  She thinks she was told to stop using this by someone but we are not really sure In any case she has not used neurontin in several months  She would like to perhaps go back on this due to neuropathy pain- I will touch base with her orthopedist to make sure they are ok with this as they are now prescribing hydrocodone for her   She notes onset of UTI sx today- urinary frequency- just started today  No pain with urination No blood in her urine   She also  mentions chest pains that occurred the last 3-4 days- but not today- episodic  She took an extra aspirin on a couple of occasions which seemed to help   She also mentions that she is having hip pain and that her ortho wanted to do an MRI for her, but she could not stand the noise of the test.  She wonders what she should do about her chronic hip pain and I asked her to consult her orthopedist   Asked if any SOB- pt states she has significant SOB for about 3 weeks, "I'm just panting trying to walk to the kitchen"    08/09/2019  1   08/09/2019  Hydrocodone-Acetamin 5-325 MG  70.00  17 Ca Kno   [id number]   Wal ([id number])   0  20.59 MME  Medicare   [place]  07/16/2019  1   07/07/2019  Hydrocodone-Acetamin 5-325 MG  70.00  17 Ca Kno   [id number]   Wal ([id number])   0  20.59 MME  Medicare   [place]  07/05/2019  1   07/05/2019  Hydrocodone-Acetamin 5-325 MG  30.00  7 Ca Kno   [id number]   Wal ([id number])   0  21.43 MME  Comm Ins     06/21/2019  1   06/21/2019  Hydrocodone-Acetamin 5-325 MG  30.00  7 Ca Kno  [id number]   Wal [phone]   0  21.43 MME  Comm Ins   [place]  06/07/2019  1   06/07/2019  Hydrocodone-Acetamin 5-325 MG  30.00  7 Ca Kno   [id number]   Wal ([id number])   0  21.43 MME  Comm Ins   [place]  05/25/2019  1   05/25/2019  Hydrocodone-Acetamin 5-325 MG  30.00  7 Ca Kno   [id number]   Wal ([id number])   0  21.43 MME  Comm Ins   [place]  05/14/2019  1   05/14/2019  Diazepam 10 MG Tablet  2.00  1 Ri Ram   [id number]   Wal ([id number])   0  2.00 LME  Comm Ins   [place]  05/10/2019  1   05/10/2019  Hydrocodone-Acetamin 5-325 MG  30.00  7 Ri Ram   [id number]   Wal ([id number])   0  21.43 MME  Comm Ins   [place]  04/29/2019  1   04/29/2019  Hydrocodone-Acetamin 5-325 MG  15.00  3 Ca Kno   [id number]   Wal ([id number])   0  25.00 MME  Comm Ins   [place]  04/26/2019  1   04/26/2019  Hydrocodone-Acetamin 5-325 MG  15.00  3 Ca Kno   [id number]   Wal ([id number])   0  25.00 MME  Comm Ins   [place]  04/09/2019  1   04/09/2019  Hydrocodone-Acetamin 5-325 MG  15.00  3 Je Cop   [id number]   Wal ([id number])   0  25.00 MME  Comm Ins   [place]   04/06/2019  1   12/16/2018  Tramadol Hcl 50 MG Tablet  130.00  30 Je Cop   [id number]   Wal ([id number])   0  21.67 MME       Lab Results  Component Value Date   HGBA1C 7.3 (H) 12/16/2018   Albuterol Symbicort Aspirin 81 Wellbutrin 100 twice daily BuSpar Gabapentin 100 4 times daily Hydrocodone Valsartan   Flu vaccine Can suggest Shingrix Due for labs  Patient Active Problem List   Diagnosis Date Noted  . Solitary kidney 12/15/2018  . Cough variant asthma 12/29/2014  . Spinal stenosis in cervical region 12/13/2014  . Radiculopathy, lumbar region 12/13/2014  . Depression 05/03/2008  . PERONEAL NEUROPATHY 05/03/2008  . Malignant neoplasm of kidney excluding renal pelvis ([place]) 08/06/2007  . Controlled type 2 diabetes mellitus without complication, without long-term current use of insulin ([place]) 08/06/2007  . GERD 08/06/2007  . PULMONARY EMBOLISM, HX OF 08/06/2007  . Hyperlipidemia 09/03/2006  . Essential hypertension 09/03/2006  . IBS 09/03/2006    Past Medical History:  Diagnosis Date  . Asthma   . Chronic obstructive pulmonary disease ([place])   . Diverticulosis   . Diverticulosis of colon (without mention of hemorrhage)   . Esophageal reflux   . History of colonic polyps 06/05/05   hyperplastic  . Internal hemorrhoids   . Irritable bowel syndrome   . Ischemic colitis ([place])   . Malignant neoplasm of kidney and other and unspecified urinary organs    renal carcinoma, left nephrectomy in 2008-per patient.  . Other and unspecified hyperlipidemia   . Personal history of venous thrombosis and embolism    PTE after nephrectomy  . Spinal stenosis   . Type II or unspecified type diabetes mellitus without mention of complication, not stated as uncontrolled 02/05/2012   pt states it was in 2007  . Unspecified essential hypertension     Past Surgical History:  Procedure Laterality Date  . BREAST BIOPSY    . COLONOSCOPY W/ POLYPECTOMY  2000  . COLONOSCOPY W/ POLYPECTOMY   07/2004   Hyperplastic polyps  . DILATION AND CURETTAGE OF UTERUS  03/2005   Uterine Polyps  . NEPHRECTOMY  2007   right,Dr Dahlstedt  . SEPTOPLASTY    . TONSILLECTOMY      Social History   Tobacco Use  . Smoking status: Never Smoker  . Smokeless tobacco: Never Used  Substance Use Topics  . Alcohol use: No  . Drug use: No    Family History  Problem Relation Age of Onset  . Stroke Mother        onset in 74s  . Diabetes Mother   . Cancer Mother        bladder; nephrectomy for calculi/also uterine , TAH & BSO  . Aneurysm Mother        thoracic  . Depression Mother   . Stroke Brother   . Heart failure Brother   . Heart attack Other        maternal family history  . Heart failure Maternal Grandfather   . Lung cancer Father        smoked  . Heart failure Sister   . Depression Sister   . Alzheimer's disease Sister   . COPD Sister   . Aneurysm Brother        cns  . Depression Maternal Aunt        X2  . Bipolar disorder Sister   . Alcoholism Neg Hx     Allergies  Allergen Reactions  . Lisinopril     Cough D/Ced by Dr Melvyn Novas  . Codeine     nausea  . Verapamil     Pain in feet    Medication list has been reviewed and updated.  Current Outpatient Medications on File Prior to Visit  Medication Sig Dispense Refill  . albuterol (PROAIR HFA) 108 (90 Base) MCG/ACT inhaler Inhale 1-2 puffs into the lungs every 6 (six) hours as needed for wheezing or shortness of breath. 8.5 Inhaler 5  . Ascorbic Acid (VITAMIN C) POWD Take 1 packet by mouth 2 (two) times daily as needed (cold symptoms).    Marland Kitchen aspirin EC 81 MG tablet Take 162 mg by mouth daily.    . Benzalkonium Chloride (DIABETIC BASICS HEALTHY FOOT EX) Apply 1 application topically daily as needed (foot pain). OTC diabetic foot cream    . Blood Glucose Monitoring Suppl (TRUERESULT BLOOD GLUCOSE) W/DEVICE KIT Use to test blood sugar ICD 10 E11 9 1 each 0  . buPROPion (WELLBUTRIN) 100 MG tablet TAKE 1 TABLET(100 MG) BY  MOUTH TWICE DAILY 60 tablet 5  . busPIRone (BUSPAR) 7.5 MG tablet TAKE 1 TABLET BY MOUTH(7.5 MG TOTAL) TWICE A DAY MAY INCREASE TO 2 TABLETS TWICE A DAY IF NEEDED 120 tablet 6  . CALCIUM PO Take 1 tablet by mouth daily.    . Cholecalciferol (VITAMIN D PO) Take 1 tablet by mouth daily.    Marland Kitchen CRANBERRY PO Take 2 capsules by mouth 2 (two) times daily.     Marland Kitchen ESTRACE VAGINAL 0.1 MG/GM vaginal cream Apply locally every other night at bedtime  1  . fluticasone (FLONASE) 50 MCG/ACT nasal spray INSTILL 2 SPRAYS INTO EACH NOSTRIL ONCE DAILY 48 g 3  . gabapentin (NEURONTIN) 100 MG capsule TAKE 1 CAPSULE BY MOUTH FOUR TIMES A DAY 120 capsule 6  . glucose blood (TRUETEST TEST) test strip Use to test  blood sugar ICD 10 E11 9 100 each 3  . HYDROcodone-acetaminophen (NORCO/VICODIN) 5-325 MG tablet Take 1 tablet by mouth every 6 (six) hours as needed. Do not take with tramadol or any other sedative 15 tablet 0  . Multiple Vitamins-Minerals (EYE VITAMINS PO) Take by mouth.    . polyethylene glycol powder (GLYCOLAX/MIRALAX) powder Take 0.5 Containers by mouth daily as needed for mild constipation. Use as needed    . SYMBICORT 80-4.5 MCG/ACT inhaler INHALE 2 PUFFS BY MOUTH TWICE A DAY 10.2 g 3  . triamcinolone (KENALOG) 0.025 % cream APPLY 1 APPLICATION TOPICALLY TWICE DAILY AS NEEDED FOR DRY SKIN ON BACK 80 g 1  . TRUEPLUS LANCETS 28G MISC Use to test blood sugar ICD 10 E11 9 100 each 3  . valsartan (DIOVAN) 40 MG tablet Take 2.5 tablets (100 mg) once daily for blood pressure 225 tablet 6   No current facility-administered medications on file prior to visit.    Review of Systems:  As per HPI- otherwise negative.   Physical Examination: Vitals:   08/25/19 1143  BP: (!) 160/82  Pulse: 89  Resp: 16  Temp: 97.6 F (36.4 C)  SpO2: 95%   Vitals:   08/25/19 1143  Weight: 127 lb (57.6 kg)  Height: 5' 3"  (1.6 m)   Body mass index is 22.5 kg/m. Ideal Body Weight: Weight in (lb) to have BMI = 25:  140.8  GEN: WDWN, NAD, Non-toxic, A & O x 3, normal weight, looks well  HEENT: Atraumatic, Normocephalic. Neck supple. No masses, No LAD. Ears and Nose: No external deformity. CV: RRR, No M/G/R. No JVD. No thrill. No extra heart sounds. PULM: CTA B, no wheezes, crackles, rhonchi. No retractions. No resp. distress. No accessory muscle use. ABD: S, NT, ND, +BS. No rebound. No HSM. EXTR: No c/c/e NEURO Normal gait.  PSYCH: Normally interactive. Conversant. Not depressed or anxious appearing.  Calm demeanor.  Foot exam: normal exam, normal monofilament sensation and pulses No swelling  EKG:  Sinus rhythm with possible old infarct C/w tracing from 05/2017- no change noted    Assessment and Plan: Shortness of breath  Essential hypertension - Plan: CANCELED: CBC, CANCELED: Comprehensive metabolic panel  Hyperlipidemia, unspecified hyperlipidemia type - Plan: CANCELED: Lipid panel  Spinal stenosis in cervical region  Controlled type 2 diabetes mellitus without complication, without long-term current use of insulin ([place]) - Plan: CANCELED: Comprehensive metabolic panel, CANCELED: Hemoglobin A1c  Solitary kidney - Plan: CANCELED: Comprehensive metabolic panel  Dysuria - Plan: Urine Culture, POCT urinalysis dipstick, Urine Microscopic Only  Urinary frequency - Plan: cephALEXin (KEFLEX) 500 MG capsule  Chest pain, unspecified type - Plan: EKG 12-Lead, CANCELED: Troponin I (High Sensitivity)  Here today for a follow-up visit of chronic concerns and UTI Treat for UTI with keflex, urine culture pending Had planned to do routine labs today Will check with her ortho and re-start gabapentin if they are ok with it  Pt also has complaint of intermittent CP for several days- not current- and also SOB for several weeks.  She states that she generally feels bad and would appreciate ER evaluation Accompanied to ER by staff today  This visit occurred during the SARS-CoV-2 public health emergency.   Safety protocols were in place, including screening questions prior to the visit, additional usage of staff PPE, and extensive cleaning of exam room while observing appropriate contact time as indicated for disinfecting solutions.   Signed Lamar Blinks, MD   Called Levy Pupa, PA-C at ortho  and asked if she is ok with adding back gabapentin; wanted to ask as she is rx hydrocodone.  She is ok with this plan  Received her urine culture 1/29- positive for E coli, should respond to keflex Gave her a call to discuss.  She states that she is feeling better, but is actually not yet picked up her antibiotic prescription.  I urged her to pick this up as soon as possible Advised her that she also can go back on gabapentin now, sent in a refill prescription for her  Results for orders placed or performed during the hospital encounter of 56/38/75  Basic metabolic panel  Result Value Ref Range   Sodium 139 135 - 145 mmol/L   Potassium 4.2 3.5 - 5.1 mmol/L   Chloride 105 98 - 111 mmol/L   CO2 24 22 - 32 mmol/L   Glucose, Bld 156 (H) 70 - 99 mg/dL   BUN 23 8 - 23 mg/dL   Creatinine, Ser 0.94 0.44 - 1.00 mg/dL   Calcium 9.2 8.9 - 10.3 mg/dL   GFR calc non Af Amer 55 (L) >60 mL/min   GFR calc Af Amer >60 >60 mL/min   Anion gap 10 5 - 15  CBC with Differential  Result Value Ref Range   WBC 8.9 4.0 - 10.5 K/uL   RBC 5.19 (H) 3.87 - 5.11 MIL/uL   Hemoglobin 15.2 (H) 12.0 - 15.0 g/dL   HCT 46.2 (H) 36.0 - 46.0 %   MCV 89.0 80.0 - 100.0 fL   MCH 29.3 26.0 - 34.0 pg   MCHC 32.9 30.0 - 36.0 g/dL   RDW 13.4 11.5 - 15.5 %   Platelets 320 150 - 400 K/uL   nRBC 0.0 0.0 - 0.2 %   Neutrophils Relative % 65 %   Neutro Abs 5.7 1.7 - 7.7 K/uL   Lymphocytes Relative 23 %   Lymphs Abs 2.1 0.7 - 4.0 K/uL   Monocytes Relative 6 %   Monocytes Absolute 0.6 0.1 - 1.0 K/uL   Eosinophils Relative 5 %   Eosinophils Absolute 0.5 0.0 - 0.5 K/uL   Basophils Relative 1 %   Basophils Absolute 0.1 0.0 - 0.1 K/uL    Immature Granulocytes 0 %   Abs Immature Granulocytes 0.03 0.00 - 0.07 K/uL  Brain natriuretic peptide  Result Value Ref Range   B Natriuretic Peptide 117.8 (H) 0.0 - 100.0 pg/mL  Troponin I (High Sensitivity)  Result Value Ref Range   Troponin I (High Sensitivity) 11 <18 ng/L

## 2019-08-25 ENCOUNTER — Emergency Department (HOSPITAL_BASED_OUTPATIENT_CLINIC_OR_DEPARTMENT_OTHER): Payer: Medicare HMO

## 2019-08-25 ENCOUNTER — Ambulatory Visit (INDEPENDENT_AMBULATORY_CARE_PROVIDER_SITE_OTHER): Payer: Medicare HMO | Admitting: Family Medicine

## 2019-08-25 ENCOUNTER — Emergency Department (HOSPITAL_BASED_OUTPATIENT_CLINIC_OR_DEPARTMENT_OTHER)
Admission: EM | Admit: 2019-08-25 | Discharge: 2019-08-25 | Disposition: A | Discharge status: Home / Self Care | Payer: Medicare HMO | Attending: Emergency Medicine | Admitting: Emergency Medicine

## 2019-08-25 ENCOUNTER — Encounter: Payer: Self-pay | Admitting: Family Medicine

## 2019-08-25 ENCOUNTER — Other Ambulatory Visit: Payer: Self-pay

## 2019-08-25 VITALS — BP 160/82 | HR 89 | Temp 97.6°F | Resp 16 | Ht 63.0 in | Wt 127.0 lb

## 2019-08-25 DIAGNOSIS — Q600 Renal agenesis, unilateral: Secondary | ICD-10-CM | POA: Diagnosis not present

## 2019-08-25 DIAGNOSIS — I10 Essential (primary) hypertension: Secondary | ICD-10-CM | POA: Diagnosis not present

## 2019-08-25 DIAGNOSIS — R350 Frequency of micturition: Secondary | ICD-10-CM

## 2019-08-25 DIAGNOSIS — Z7982 Long term (current) use of aspirin: Secondary | ICD-10-CM | POA: Diagnosis not present

## 2019-08-25 DIAGNOSIS — R079 Chest pain, unspecified: Secondary | ICD-10-CM | POA: Diagnosis not present

## 2019-08-25 DIAGNOSIS — J45909 Unspecified asthma, uncomplicated: Secondary | ICD-10-CM | POA: Diagnosis not present

## 2019-08-25 DIAGNOSIS — Z79899 Other long term (current) drug therapy: Secondary | ICD-10-CM | POA: Insufficient documentation

## 2019-08-25 DIAGNOSIS — IMO0002 Reserved for concepts with insufficient information to code with codable children: Secondary | ICD-10-CM

## 2019-08-25 DIAGNOSIS — Z85528 Personal history of other malignant neoplasm of kidney: Secondary | ICD-10-CM | POA: Diagnosis not present

## 2019-08-25 DIAGNOSIS — R0602 Shortness of breath: Secondary | ICD-10-CM | POA: Insufficient documentation

## 2019-08-25 DIAGNOSIS — E119 Type 2 diabetes mellitus without complications: Secondary | ICD-10-CM | POA: Insufficient documentation

## 2019-08-25 DIAGNOSIS — R300 Dysuria: Secondary | ICD-10-CM | POA: Diagnosis not present

## 2019-08-25 DIAGNOSIS — R0789 Other chest pain: Secondary | ICD-10-CM | POA: Diagnosis not present

## 2019-08-25 DIAGNOSIS — E785 Hyperlipidemia, unspecified: Secondary | ICD-10-CM

## 2019-08-25 DIAGNOSIS — R5383 Other fatigue: Secondary | ICD-10-CM | POA: Diagnosis not present

## 2019-08-25 DIAGNOSIS — M4802 Spinal stenosis, cervical region: Secondary | ICD-10-CM | POA: Diagnosis not present

## 2019-08-25 LAB — POCT URINALYSIS DIP (MANUAL ENTRY)
Bilirubin, UA: NEGATIVE
Glucose, UA: NEGATIVE mg/dL
Ketones, POC UA: NEGATIVE mg/dL
Nitrite, UA: POSITIVE — AB
Spec Grav, UA: 1.015 (ref 1.010–1.025)
Urobilinogen, UA: 0.2 E.U./dL
pH, UA: 6 (ref 5.0–8.0)

## 2019-08-25 LAB — BASIC METABOLIC PANEL
Anion gap: 10 (ref 5–15)
BUN: 23 mg/dL (ref 8–23)
CO2: 24 mmol/L (ref 22–32)
Calcium: 9.2 mg/dL (ref 8.9–10.3)
Chloride: 105 mmol/L (ref 98–111)
Creatinine, Ser: 0.94 mg/dL (ref 0.44–1.00)
GFR calc Af Amer: >60 mL/min (ref >60)
GFR calc non Af Amer: 55 mL/min — ABNORMAL LOW (ref >60)
Glucose, Bld: 156 mg/dL — ABNORMAL HIGH (ref 70–99)
Potassium: 4.2 mmol/L (ref 3.5–5.1)
Sodium: 139 mmol/L (ref 135–145)

## 2019-08-25 LAB — URINALYSIS, MICROSCOPIC ONLY

## 2019-08-25 LAB — CBC WITH DIFFERENTIAL/PLATELET
Abs Immature Granulocytes: 0.03 10*3/uL (ref 0.00–0.07)
Basophils Absolute: 0.1 10*3/uL (ref 0.0–0.1)
Basophils Relative: 1 %
Eosinophils Absolute: 0.5 10*3/uL (ref 0.0–0.5)
Eosinophils Relative: 5 %
HCT: 46.2 % — ABNORMAL HIGH (ref 36.0–46.0)
Hemoglobin: 15.2 g/dL — ABNORMAL HIGH (ref 12.0–15.0)
Immature Granulocytes: 0 %
Lymphocytes Relative: 23 %
Lymphs Abs: 2.1 10*3/uL (ref 0.7–4.0)
MCH: 29.3 pg (ref 26.0–34.0)
MCHC: 32.9 g/dL (ref 30.0–36.0)
MCV: 89 fL (ref 80.0–100.0)
Monocytes Absolute: 0.6 10*3/uL (ref 0.1–1.0)
Monocytes Relative: 6 %
Neutro Abs: 5.7 10*3/uL (ref 1.7–7.7)
Neutrophils Relative %: 65 %
Platelets: 320 10*3/uL (ref 150–400)
RBC: 5.19 MIL/uL — ABNORMAL HIGH (ref 3.87–5.11)
RDW: 13.4 % (ref 11.5–15.5)
WBC: 8.9 10*3/uL (ref 4.0–10.5)
nRBC: 0 % (ref 0.0–0.2)

## 2019-08-25 LAB — TROPONIN I (HIGH SENSITIVITY): Troponin I (High Sensitivity): 11 ng/L (ref <18)

## 2019-08-25 LAB — BRAIN NATRIURETIC PEPTIDE: B Natriuretic Peptide: 117.8 pg/mL — ABNORMAL HIGH (ref 0.0–100.0)

## 2019-08-25 MED ORDER — CEPHALEXIN 500 MG PO CAPS: 500.0000 mg | ORAL_CAPSULE | Freq: Two times a day (BID) | ORAL | 0 refills | Status: DC

## 2019-08-25 NOTE — Discharge Instructions (Addendum)
You were sent to the emergency department by your primary care doctor for evaluation of chest pain and shortness of breath.  You had a chest x-ray EKG and blood work that did not show any serious findings.  I reviewed your results with Dr. Edilia Bo.  We feel that you can be safely discharged and can follow back up with her in the office.  Return to the emergency department if any acute worsening in your symptoms.

## 2019-08-25 NOTE — ED Provider Notes (Signed)
Alexa [name] Provider Note   CSN: [id number] Arrival date & time: 08/25/19  1239     History Chief Complaint  Patient presents with  . Chest Pain    Alexa [name] is a 86 y.o. female.  Patient was seen in her primary care doctor's office upstairs and sent down here for evaluation of chest pain and shortness of breath.  Patient states she has had shortness of breath for a few weeks with some generalized fatigue.  For the last 4 nights she has noticed a pricking sensation in the left side of her chest that comes and goes.  Currently no chest pain.  She has other various complaints including some pins-and-needles from her knees down bilaterally.  The history is provided by the patient.  Chest Pain Pain location:  L chest Pain quality comment:  Pricking Pain radiates to:  Does not radiate Pain severity:  Mild Onset quality:  Gradual Timing:  Intermittent Progression:  Unchanged Chronicity:  New Context: at rest   Relieved by:  None tried Worsened by:  Nothing Ineffective treatments:  None tried Associated symptoms: fatigue and shortness of breath   Associated symptoms: no abdominal pain, no cough, no fever, no nausea, no syncope, no vomiting and no weakness   Risk factors: no coronary artery disease        Past Medical History:  Diagnosis Date  . Asthma   . Chronic obstructive pulmonary disease ([place])   . Diverticulosis   . Diverticulosis of colon (without mention of hemorrhage)   . Esophageal reflux   . History of colonic polyps 06/05/05   hyperplastic  . Internal hemorrhoids   . Irritable bowel syndrome   . Ischemic colitis ([place])   . Malignant neoplasm of kidney and other and unspecified urinary organs    renal carcinoma, left nephrectomy in 2008-per patient.  . Other and unspecified hyperlipidemia   . Personal history of venous thrombosis and embolism    PTE after nephrectomy  . Spinal stenosis   . Type II or unspecified type diabetes  mellitus without mention of complication, not stated as uncontrolled 02/05/2012   pt states it was in 2007  . Unspecified essential hypertension     Patient Active Problem List   Diagnosis Date Noted  . Solitary kidney 12/15/2018  . Cough variant asthma 12/29/2014  . Spinal stenosis in cervical region 12/13/2014  . Radiculopathy, lumbar region 12/13/2014  . Depression 05/03/2008  . PERONEAL NEUROPATHY 05/03/2008  . Malignant neoplasm of kidney excluding renal pelvis ([place]) 08/06/2007  . Controlled type 2 diabetes mellitus without complication, without long-term current use of insulin ([place]) 08/06/2007  . GERD 08/06/2007  . PULMONARY EMBOLISM, HX OF 08/06/2007  . Hyperlipidemia 09/03/2006  . Essential hypertension 09/03/2006  . IBS 09/03/2006    Past Surgical History:  Procedure Laterality Date  . BREAST BIOPSY    . COLONOSCOPY W/ POLYPECTOMY  2000  . COLONOSCOPY W/ POLYPECTOMY  07/2004   Hyperplastic polyps  . DILATION AND CURETTAGE OF UTERUS  03/2005   Uterine Polyps  . NEPHRECTOMY  2007   right,Dr Dahlstedt  . SEPTOPLASTY    . TONSILLECTOMY       OB History   No obstetric history on file.     Family History  Problem Relation Age of Onset  . Stroke Mother        onset in 14s  . Diabetes Mother   . Cancer Mother        bladder; nephrectomy  for calculi/also uterine , TAH & BSO  . Aneurysm Mother        thoracic  . Depression Mother   . Stroke Brother   . Heart failure Brother   . Heart attack Other        maternal family history  . Heart failure Maternal Grandfather   . Lung cancer Father        smoked  . Heart failure Sister   . Depression Sister   . Alzheimer's disease Sister   . COPD Sister   . Aneurysm Brother        cns  . Depression Maternal Aunt        X2  . Bipolar disorder Sister   . Alcoholism Neg Hx     Social History   Tobacco Use  . Smoking status: Never Smoker  . Smokeless tobacco: Never Used  Substance Use Topics  . Alcohol use:  No  . Drug use: No    Home Medications Prior to Admission medications   Medication Sig Start Date End Date Taking? Authorizing Provider  albuterol (PROAIR HFA) 108 (90 Base) MCG/ACT inhaler Inhale 1-2 puffs into the lungs every 6 (six) hours as needed for wheezing or shortness of breath. 12/16/18   Copland, Gay Filler, MD  Ascorbic Acid (VITAMIN C) POWD Take 1 packet by mouth 2 (two) times daily as needed (cold symptoms).    [provider]  aspirin EC 81 MG tablet Take 162 mg by mouth daily.    [provider]  Benzalkonium Chloride (DIABETIC BASICS HEALTHY FOOT EX) Apply 1 application topically daily as needed (foot pain). OTC diabetic foot cream    [provider]  Blood Glucose Monitoring Suppl (TRUERESULT BLOOD GLUCOSE) W/DEVICE KIT Use to test blood sugar ICD 10 E11 9 07/14/14   Hendricks Limes, MD  buPROPion (WELLBUTRIN) 100 MG tablet TAKE 1 TABLET(100 MG) BY MOUTH TWICE DAILY 04/08/19   Copland, Gay Filler, MD  busPIRone (BUSPAR) 7.5 MG tablet TAKE 1 TABLET BY MOUTH(7.5 MG TOTAL) TWICE A DAY MAY INCREASE TO 2 TABLETS TWICE A DAY IF NEEDED 01/28/19   Copland, Gay Filler, MD  CALCIUM PO Take 1 tablet by mouth daily.    [provider]  cephALEXin (KEFLEX) 500 MG capsule Take 1 capsule (500 mg total) by mouth 2 (two) times daily. 08/25/19   Copland, Gay Filler, MD  Cholecalciferol (VITAMIN D PO) Take 1 tablet by mouth daily.    [provider]  CRANBERRY PO Take 2 capsules by mouth 2 (two) times daily.     [provider]  ESTRACE VAGINAL 0.1 MG/GM vaginal cream Apply locally every other night at bedtime 01/16/16   [provider]  fluticasone (FLONASE) 50 MCG/ACT nasal spray INSTILL 2 SPRAYS INTO EACH NOSTRIL ONCE DAILY 12/16/18   Copland, Gay Filler, MD  gabapentin (NEURONTIN) 100 MG capsule TAKE 1 CAPSULE BY MOUTH FOUR TIMES A DAY 12/29/18   Copland, Gay Filler, MD  glucose blood (TRUETEST TEST) test strip Use to test blood sugar ICD 10  E11 9 07/14/14   Hendricks Limes, MD  HYDROcodone-acetaminophen (NORCO/VICODIN) 5-325 MG tablet Take 1 tablet by mouth every 6 (six) hours as needed. Do not take with tramadol or any other sedative 04/09/19   Copland, Gay Filler, MD  Multiple Vitamins-Minerals (EYE VITAMINS PO) Take by mouth.    [provider]  polyethylene glycol powder (GLYCOLAX/MIRALAX) powder Take 0.5 Containers by mouth daily as needed for mild constipation. Use as  needed 11/15/15   [provider]  SYMBICORT 80-4.5 MCG/ACT inhaler INHALE 2 PUFFS BY MOUTH TWICE A DAY 05/25/18   Copland, Gay Filler, MD  triamcinolone (KENALOG) 0.025 % cream APPLY 1 APPLICATION TOPICALLY TWICE DAILY AS NEEDED FOR DRY SKIN ON BACK 02/19/19   Copland, Gay Filler, MD  TRUEPLUS LANCETS 28G MISC Use to test blood sugar ICD 10 E11 9 07/14/14   Hendricks Limes, MD  valsartan (DIOVAN) 40 MG tablet Take 2.5 tablets (100 mg) once daily for blood pressure 03/31/19   Copland, Gay Filler, MD    Allergies    Lisinopril, Codeine, and Verapamil  Review of Systems   Review of Systems  Constitutional: Positive for fatigue. Negative for fever.  HENT: Negative for sore throat.   Eyes: Negative for visual disturbance.  Respiratory: Positive for shortness of breath. Negative for cough.   Cardiovascular: Positive for chest pain. Negative for syncope.  Gastrointestinal: Negative for abdominal pain, nausea and vomiting.  Genitourinary: Negative for dysuria.  Musculoskeletal: Negative for joint swelling.  Skin: Negative for rash.  Neurological: Negative for weakness.    Physical Exam Updated Vital Signs BP (!) 173/109 (BP Location: Right Arm)   Pulse 91   Temp 98.8 F (37.1 C) (Oral)   Resp 18   SpO2 97%   Physical Exam Vitals and nursing note reviewed.  Constitutional:      General: She is not in acute distress.    Appearance: She is well-developed.  HENT:     Head: Normocephalic and atraumatic.  Eyes:     Conjunctiva/sclera:  Conjunctivae normal.  Cardiovascular:     Rate and Rhythm: Normal rate and regular rhythm.     Heart sounds: Normal heart sounds. No murmur.  Pulmonary:     Effort: Pulmonary effort is normal. No respiratory distress.     Breath sounds: Normal breath sounds.  Abdominal:     Palpations: Abdomen is soft.     Tenderness: There is no abdominal tenderness.  Musculoskeletal:        General: Normal range of motion.     Cervical back: Neck supple.     Right lower leg: No edema.     Left lower leg: No edema.  Skin:    General: Skin is warm and dry.     Capillary Refill: Capillary refill takes less than 2 seconds.  Neurological:     General: No focal deficit present.     Mental Status: She is alert.     ED Results / Procedures / Treatments   Labs (all labs ordered are listed, but only abnormal results are displayed) Labs Reviewed  BASIC METABOLIC PANEL - Abnormal; Notable for the following components:      Result Value   Glucose, Bld 156 (*)    GFR calc non Af Amer 55 (*)    All other components within normal limits  CBC WITH DIFFERENTIAL/PLATELET - Abnormal; Notable for the following components:   RBC 5.19 (*)    Hemoglobin 15.2 (*)    HCT 46.2 (*)    All other components within normal limits  BRAIN NATRIURETIC PEPTIDE - Abnormal; Notable for the following components:   B Natriuretic Peptide 117.8 (*)    All other components within normal limits  TROPONIN I (HIGH SENSITIVITY)  TROPONIN I (HIGH SENSITIVITY)    EKG EKG Interpretation  Date/Time:  Wednesday August 25 2019 12:49:21 EST Ventricular Rate:  86 PR Interval:  190 QRS Duration: 70 QT Interval:  354 QTC Calculation:  423 R Axis:   -52 Text Interpretation: Normal sinus rhythm Left axis deviation Inferior infarct , age undetermined Anterior infarct , age undetermined ST & T wave abnormality, consider lateral ischemia Abnormal ECG poor baseline, likely similar to prior 11/18 Confirmed by Aletta Edouard [phone]) on  08/25/2019 12:56:11 PM   Radiology DG Chest Port 1 View  Result Date: 08/25/2019 CLINICAL DATA:  Shortness of breath EXAM: PORTABLE CHEST 1 VIEW COMPARISON:  07/31/2017 FINDINGS: There is no new consolidation or edema. No pleural effusion or pneumothorax. Stable cardiomediastinal contours. IMPRESSION: No acute process in the chest. Electronically Signed   By: Macy Mis M.D.   On: 08/25/2019 13:58    Procedures Procedures (including critical care time)  Medications Ordered in ED Medications - No data to display  ED Course  I have reviewed the triage vital signs and the nursing notes.  Pertinent labs & imaging results that were available during my care of the patient were reviewed by me and considered in my medical decision making (see chart for details).  Clinical Course as of Aug 24 1810  Wed Aug 24, 5958  602 86 year old female here with complaints of generalized fatigue and weakness, some shortness of breath and some atypical left-sided pricking chest pain for the last 4 days.  Differential includes ACS, GERD, pneumonia, pneumothorax, musculoskeletal, metabolic derangement   [MB]  1359 Chest x-ray showing flattened diaphragms consistent with COPD.  No gross infiltrates.  Interpreted by me.   [MB]  79 Discussed with patient's PCP Dr. Lorelei Pont who reviewed the work-up that I have done and she is comfortable with her going home and follow-up in the office.   [MB]    Clinical Course User Index [MB] Hayden Rasmussen, MD   MDM Rules/Calculators/A&P                       Final Clinical Impression(s) / ED Diagnoses Final diagnoses:  Nonspecific chest pain  SOB (shortness of breath)    Rx / DC Orders ED Discharge Orders    None       Hayden Rasmussen, MD 08/25/19 [phone]

## 2019-08-25 NOTE — ED Notes (Signed)
Phoned [place] daughter [phone] who states she will come get husband

## 2019-08-25 NOTE — ED Notes (Signed)
Attempted IV to RAC, tol well, unsuccessful but blood obtained for labs

## 2019-08-25 NOTE — Patient Instructions (Addendum)
It was good to see you again today I will be in touch with your labs as soon as possible You might consider having the Shingrix vaccine to protect you against shingles, if not done already  I will touch base with your orthopedic provide about your going back on gabapentin- will let you know what they say  Your feet seem to have normal circulation and fairly normal sensation   We are going to teat you for a UTI with an antibiotic, I will send your urine for a culture and be in touch with your results

## 2019-08-25 NOTE — ED Triage Notes (Signed)
Pt sent from PCP in the building-c/o CP x 3-4 days-SOB, fatigue x 2-3 weeks-to triage in w/c-NAD

## 2019-08-27 ENCOUNTER — Telehealth: Payer: Self-pay | Admitting: *Deleted

## 2019-08-27 LAB — URINE CULTURE
MICRO NUMBER:: 10086751
SPECIMEN QUALITY:: ADEQUATE

## 2019-08-27 MED ORDER — GABAPENTIN 100 MG PO CAPS: ORAL_CAPSULE | ORAL | 6 refills | Status: DC

## 2019-08-27 NOTE — Telephone Encounter (Signed)
Pt called asking what can she take for pain. She is advised to call the office and schedule an appointment.

## 2019-08-27 NOTE — Addendum Note (Signed)
Addended by: Lamar Blinks C on: 08/27/2019 01:29 PM   Modules accepted: Orders

## 2019-09-02 ENCOUNTER — Other Ambulatory Visit: Payer: Self-pay

## 2019-09-17 ENCOUNTER — Telehealth: Payer: Self-pay | Admitting: Family Medicine

## 2019-09-17 ENCOUNTER — Other Ambulatory Visit: Payer: Self-pay | Admitting: Family Medicine

## 2019-09-17 DIAGNOSIS — L853 Xerosis cutis: Secondary | ICD-10-CM

## 2019-09-17 NOTE — Telephone Encounter (Signed)
Medication: triamcinolone (KENALOG) 0.025 % cream   Has the patient contacted their pharmacy? Yes.   (If no, request that the patient contact the pharmacy for the refill.) (If yes, when and what did the pharmacy advise?) SAID THEY SENT IN REFILL 4 DAYS AGAO  Preferred Pharmacy (with phone number or street name):[facility] [id number]  [place], [place] [address]  Phone:  [phone] Fax:  [phone]    Agent: Please be advised that RX refills may take up to 3 business days. We ask that you follow-up with your pharmacy.

## 2019-09-17 NOTE — Telephone Encounter (Signed)
Medication refilled

## 2019-09-30 ENCOUNTER — Other Ambulatory Visit: Payer: Self-pay | Admitting: *Deleted

## 2019-09-30 DIAGNOSIS — J449 Chronic obstructive pulmonary disease, unspecified: Secondary | ICD-10-CM

## 2019-09-30 MED ORDER — ALBUTEROL SULFATE HFA 108 (90 BASE) MCG/ACT IN AERS: 1.0000 | INHALATION_SPRAY | Freq: Four times a day (QID) | RESPIRATORY_TRACT | 0 refills | Status: DC | PRN

## 2019-10-21 ENCOUNTER — Ambulatory Visit: Payer: Medicare HMO | Attending: Internal Medicine

## 2019-10-21 DIAGNOSIS — Z23 Encounter for immunization: Secondary | ICD-10-CM

## 2019-10-21 NOTE — Progress Notes (Signed)
   Covid-19 Vaccination Clinic  Name:  Alexa [name]    MRN: [id number] DOB: 05/05/1930  10/21/2019  [name] was observed post Covid-19 immunization for 15 minutes without incident. She was provided with Vaccine Information Sheet and instruction to access the V-Safe system.   [name] was instructed to call 911 with any severe reactions post vaccine: Marland Kitchen Difficulty breathing  . Swelling of face and throat  . A fast heartbeat  . A bad rash all over body  . Dizziness and weakness   Immunizations Administered    Name Date Dose VIS Date Route   Pfizer COVID-19 Vaccine 10/21/2019  3:10 PM 0.3 mL 07/09/2019 Intramuscular   Manufacturer: [place]   Lot: [id number]   [place]: [id number]

## 2019-10-22 ENCOUNTER — Ambulatory Visit (INDEPENDENT_AMBULATORY_CARE_PROVIDER_SITE_OTHER): Payer: Medicare HMO | Admitting: *Deleted

## 2019-10-22 ENCOUNTER — Other Ambulatory Visit: Payer: Self-pay

## 2019-10-22 ENCOUNTER — Encounter: Payer: Self-pay | Admitting: *Deleted

## 2019-10-22 DIAGNOSIS — Z0000 Encounter for general adult medical examination without abnormal findings: Secondary | ICD-10-CM | POA: Diagnosis not present

## 2019-10-22 NOTE — Progress Notes (Signed)
Nurse connected with patient 10/22/19 at 11:00 AM EDT by a telephone enabled telemedicine application and verified that I am speaking with the correct person using two identifiers. Patient stated full name and DOB. Patient gave permission to continue with virtual visit. Patient's location was at home and Nurse's location was at [place] office.   Subjective:   Alexa [name] is a 86 y.o. female who presents for an Initial Medicare Annual Wellness Visit.  Review of Systems    Home Safety/Smoke Alarms: Feels safe in home. Smoke alarms in place.  Uses cane as needed.  Lives with husband in 1 story.    Female:      Mammo- declines     Dexa scan- declines    Objective:      Advanced Directives 10/22/2019 08/25/2019 06/06/2017 07/06/2015 03/18/2015 11/22/2014 02/05/2012  Does Patient Have a Medical Advance Directive? No Yes No No No No Patient has advance directive, copy not in chart  Type of Advance Directive - - - - - - Press photographer;Living will  Would patient like information on creating a medical advance directive? No - Patient declined - - No - patient declined information - No - patient declined information -    Current Medications (verified) Outpatient Encounter Medications as of 10/22/2019  Medication Sig  . albuterol (PROAIR HFA) 108 (90 Base) MCG/ACT inhaler Inhale 1-2 puffs into the lungs every 6 (six) hours as needed for wheezing or shortness of breath.  . Ascorbic Acid (VITAMIN C) POWD Take 1 packet by mouth 2 (two) times daily as needed (cold symptoms).  Marland Kitchen aspirin EC 81 MG tablet Take 162 mg by mouth daily.  . Benzalkonium Chloride (DIABETIC BASICS HEALTHY FOOT EX) Apply 1 application topically daily as needed (foot pain). OTC diabetic foot cream  . Blood Glucose Monitoring Suppl (TRUERESULT BLOOD GLUCOSE) W/DEVICE KIT Use to test blood sugar ICD 10 E11 9  . buPROPion (WELLBUTRIN) 100 MG tablet TAKE 1 TABLET(100 MG) BY MOUTH TWICE DAILY  . busPIRone (BUSPAR) 7.5 MG  tablet TAKE 1 TABLET BY MOUTH(7.5 MG TOTAL) TWICE A DAY MAY INCREASE TO 2 TABLETS TWICE A DAY IF NEEDED  . CALCIUM PO Take 1 tablet by mouth daily.  . Cholecalciferol (VITAMIN D PO) Take 1 tablet by mouth daily.  Marland Kitchen CRANBERRY PO Take 2 capsules by mouth 2 (two) times daily.   Marland Kitchen ESTRACE VAGINAL 0.1 MG/GM vaginal cream Apply locally every other night at bedtime  . fluticasone (FLONASE) 50 MCG/ACT nasal spray INSTILL 2 SPRAYS INTO EACH NOSTRIL ONCE DAILY  . gabapentin (NEURONTIN) 100 MG capsule Take one capsule by mouth daily for 3 days, then increase to twice a day for 3 days, then 1 three times a day for maintenance dose  . glucose blood (TRUETEST TEST) test strip Use to test blood sugar ICD 10 E11 9  . HYDROcodone-acetaminophen (NORCO/VICODIN) 5-325 MG tablet Take 1 tablet by mouth every 6 (six) hours as needed. Do not take with tramadol or any other sedative  . Multiple Vitamins-Minerals (EYE VITAMINS PO) Take by mouth.  . polyethylene glycol powder (GLYCOLAX/MIRALAX) powder Take 0.5 Containers by mouth daily as needed for mild constipation. Use as needed  . SYMBICORT 80-4.5 MCG/ACT inhaler INHALE 2 PUFFS BY MOUTH TWICE A DAY  . triamcinolone (KENALOG) 0.025 % cream APPLY 1 APPLICATION TOPICALLY TWICE DAILY AS NEEDED FOR DRY SKIN ON BACK  . TRUEPLUS LANCETS 28G MISC Use to test blood sugar ICD 10 E11 9  . valsartan (DIOVAN) 40  MG tablet Take 2.5 tablets (100 mg) once daily for blood pressure  . [DISCONTINUED] cephALEXin (KEFLEX) 500 MG capsule Take 1 capsule (500 mg total) by mouth 2 (two) times daily.   No facility-administered encounter medications on file as of 10/22/2019.    Allergies (verified) Lisinopril, Codeine, and Verapamil   History: Past Medical History:  Diagnosis Date  . Asthma   . Chronic obstructive pulmonary disease ([place])   . Diverticulosis   . Diverticulosis of colon (without mention of hemorrhage)   . Esophageal reflux   . History of colonic polyps 06/05/05    hyperplastic  . Internal hemorrhoids   . Irritable bowel syndrome   . Ischemic colitis ([place])   . Malignant neoplasm of kidney and other and unspecified urinary organs    renal carcinoma, left nephrectomy in 2008-per patient.  . Other and unspecified hyperlipidemia   . Personal history of venous thrombosis and embolism    PTE after nephrectomy  . Spinal stenosis   . Type II or unspecified type diabetes mellitus without mention of complication, not stated as uncontrolled 02/05/2012   pt states it was in 2007  . Unspecified essential hypertension    Past Surgical History:  Procedure Laterality Date  . BREAST BIOPSY    . COLONOSCOPY W/ POLYPECTOMY  2000  . COLONOSCOPY W/ POLYPECTOMY  07/2004   Hyperplastic polyps  . DILATION AND CURETTAGE OF UTERUS  03/2005   Uterine Polyps  . NEPHRECTOMY  2007   right,Dr Dahlstedt  . SEPTOPLASTY    . TONSILLECTOMY     Family History  Problem Relation Age of Onset  . Stroke Mother        onset in 16s  . Diabetes Mother   . Cancer Mother        bladder; nephrectomy for calculi/also uterine , TAH & BSO  . Aneurysm Mother        thoracic  . Depression Mother   . Stroke Brother   . Heart failure Brother   . Heart attack Other        maternal family history  . Heart failure Maternal Grandfather   . Lung cancer Father        smoked  . Heart failure Sister   . Depression Sister   . Alzheimer's disease Sister   . COPD Sister   . Aneurysm Brother        cns  . Depression Maternal Aunt        X2  . Bipolar disorder Sister   . Alcoholism Neg Hx    Social History   Socioeconomic History  . Marital status: Married    Spouse name: Not on file  . Number of children: Not on file  . Years of education: Not on file  . Highest education level: Not on file  Occupational History  . Not on file  Tobacco Use  . Smoking status: Never Smoker  . Smokeless tobacco: Never Used  Substance and Sexual Activity  . Alcohol use: No  . Drug use: No  .  Sexual activity: Not Currently  Other Topics Concern  . Not on file  Social History Narrative  . Not on file   Social Determinants of Health   Financial Resource Strain: Low Risk   . Difficulty of Paying Living Expenses: Not hard at all  Food Insecurity: No Food Insecurity  . Worried About Charity fundraiser in the Last Year: Never true  . Ran Out of Food in the Last Year:  Never true  Transportation Needs: No Transportation Needs  . Lack of Transportation (Medical): No  . Lack of Transportation (Non-Medical): No  Physical Activity:   . Days of Exercise per Week:   . Minutes of Exercise per Session:   Stress:   . Feeling of Stress :   Social Connections:   . Frequency of Communication with Friends and Family:   . Frequency of Social Gatherings with Friends and Family:   . Attends Religious Services:   . Active Member of Clubs or Organizations:   . Attends Archivist Meetings:   Marland Kitchen Marital Status:     Tobacco Counseling Counseling given: Not Answered   Clinical Intake: Pain : No/denies pain  Activities of Daily Living In your present state of health, do you have any difficulty performing the following activities: 10/22/2019  Hearing? N  Vision? N  Difficulty concentrating or making decisions? N  Walking or climbing stairs? N  Dressing or bathing? N  Doing errands, shopping? N  Preparing Food and eating ? N  Using the Toilet? N  In the past six months, have you accidently leaked urine? N  Do you have problems with loss of bowel control? N  Managing your Medications? N  Managing your Finances? N  Housekeeping or managing your Housekeeping? N  Some recent data might be hidden     Immunizations and Health Maintenance Immunization History  Administered Date(s) Administered  . Influenza Split 07/03/2011  . Influenza Whole 07/02/2007  . Influenza, High Dose Seasonal PF 06/01/2013, 04/28/2014, 07/31/2017, 04/23/2018  . Influenza-Unspecified 04/05/2015  .  PFIZER SARS-COV-2 Vaccination 10/21/2019  . Pneumococcal Polysaccharide-23 07/27/2010, 11/23/2014  . Pneumococcal-Unspecified 05/14/2016  . Zoster 07/30/2011   Health Maintenance Due  Topic Date Due  . TETANUS/TDAP  Never done  . PNA vac Low Risk Adult (2 of 2 - PCV13) 05/14/2017  . INFLUENZA VACCINE  02/27/2019  . HEMOGLOBIN A1C  06/18/2019  . OPHTHALMOLOGY EXAM  09/18/2019    Patient Care Team: Copland, Gay Filler, MD as PCP - General (Family Medicine)  Indicate any recent Medical Services you may have received from other than [facility] providers in the past year (date may be approximate).     Assessment:   This is a routine wellness examination for Elisabella. Physical assessment deferred to PCP.   Hearing/Vision screen Unable to assess. This visit is enabled though telemedicine due to Covid 19.   Dietary issues and exercise activities discussed: Current Exercise Habits: The patient does not participate in regular exercise at present, Exercise limited by: None identified Diet (meal preparation, eat out, water intake, caffeinated beverages, dairy products, fruits and vegetables): well balanced   Goals    . Maintain health and independence      Depression Screen PHQ 2/9 Scores 10/22/2019 06/23/2018 04/09/2017 12/07/2015 06/05/2015 10/26/2013  PHQ - 2 Score 0 2 2 0 0 0  PHQ- 9 Score - 6 7 - - -  Exception Documentation - - Medical reason - - -    Fall Risk Fall Risk  10/22/2019 04/23/2018 04/09/2017 12/07/2015 06/05/2015  Falls in the past year? 1 Yes No No No  Number falls in past yr: 0 1 - - -  Injury with Fall? 0 No - - -  Follow up Education provided;Falls prevention discussed - - - -   Cognitive Function: Ad8 score reviewed for issues:  Issues making decisions:no  Less interest in hobbies / activities:no  Repeats questions, stories (family complaining):no  Trouble using ordinary gadgets (microwave,  computer, phone):no  Forgets the month or year: no  Mismanaging finances:  no  Remembering appts:no  Daily problems with thinking and/or memory:no Ad8 score is=0         Screening Tests Health Maintenance  Topic Date Due  . TETANUS/TDAP  Never done  . PNA vac Low Risk Adult (2 of 2 - PCV13) 05/14/2017  . INFLUENZA VACCINE  02/27/2019  . HEMOGLOBIN A1C  06/18/2019  . OPHTHALMOLOGY EXAM  09/18/2019  . FOOT EXAM  12/16/2019  . DEXA SCAN  Addressed     Plan:  Please schedule your next medicare wellness visit with me in 1 yr.  Continue to eat heart healthy diet (full of fruits, vegetables, whole grains, lean protein, water--limit salt, fat, and sugar intake) and increase physical activity as tolerated.  Continue doing brain stimulating activities (puzzles, reading, adult coloring books, staying active) to keep memory sharp.    I have personally reviewed and noted the following in the patient's chart:   . Medical and social history . Use of alcohol, tobacco or illicit drugs  . Current medications and supplements . Functional ability and status . Nutritional status . Physical activity . Advanced directives . List of other physicians . Hospitalizations, surgeries, and ER visits in previous 12 months . Vitals . Screenings to include cognitive, depression, and falls . Referrals and appointments  In addition, I have reviewed and discussed with patient certain preventive protocols, quality metrics, and best practice recommendations. A written personalized care plan for preventive services as well as general preventive health recommendations were provided to patient.     Naaman Plummer [place], [place]   10/22/2019

## 2019-10-22 NOTE — Patient Instructions (Signed)
Please schedule your next medicare wellness visit with me in 1 yr.  Continue to eat heart healthy diet (full of fruits, vegetables, whole grains, lean protein, water--limit salt, fat, and sugar intake) and increase physical activity as tolerated.  Continue doing brain stimulating activities (puzzles, reading, adult coloring books, staying active) to keep memory sharp.    [name] , Thank you for taking time to come for your Medicare Wellness Visit. I appreciate your ongoing commitment to your health goals. Please review the following plan we discussed and let me know if I can assist you in the future.   These are the goals we discussed: Goals    . Maintain health and independence       This is a list of the screening recommended for you and due dates:  Health Maintenance  Topic Date Due  . Tetanus Vaccine  Never done  . Pneumonia vaccines (2 of 2 - PCV13) 05/14/2017  . Flu Shot  02/27/2019  . Hemoglobin A1C  06/18/2019  . Eye exam for diabetics  09/18/2019  . Complete foot exam   12/16/2019  . DEXA scan (bone density measurement)  Addressed    Preventive Care 20 Years and Older, Female Preventive care refers to lifestyle choices and visits with your health care provider that can promote health and wellness. This includes:  A yearly physical exam. This is also called an annual well check.  Regular dental and eye exams.  Immunizations.  Screening for certain conditions.  Healthy lifestyle choices, such as diet and exercise. What can I expect for my preventive care visit? Physical exam Your health care provider will check:  Height and weight. These may be used to calculate body mass index (BMI), which is a measurement that tells if you are at a healthy weight.  Heart rate and blood pressure.  Your skin for abnormal spots. Counseling Your health care provider may ask you questions about:  Alcohol, tobacco, and drug use.  Emotional well-being.  Home and relationship  well-being.  Sexual activity.  Eating habits.  History of falls.  Memory and ability to understand (cognition).  Work and work Statistician.  Pregnancy and menstrual history. What immunizations do I need?  Influenza (flu) vaccine  This is recommended every year. Tetanus, diphtheria, and pertussis (Tdap) vaccine  You may need a Td booster every 10 years. Varicella (chickenpox) vaccine  You may need this vaccine if you have not already been vaccinated. Zoster (shingles) vaccine  You may need this after age 36. Pneumococcal conjugate (PCV13) vaccine  One dose is recommended after age 39. Pneumococcal polysaccharide (PPSV23) vaccine  One dose is recommended after age 53. Measles, mumps, and rubella (MMR) vaccine  You may need at least one dose of MMR if you were born in 1957 or later. You may also need a second dose. Meningococcal conjugate (MenACWY) vaccine  You may need this if you have certain conditions. Hepatitis A vaccine  You may need this if you have certain conditions or if you travel or work in places where you may be exposed to hepatitis A. Hepatitis B vaccine  You may need this if you have certain conditions or if you travel or work in places where you may be exposed to hepatitis B. Haemophilus influenzae type b (Hib) vaccine  You may need this if you have certain conditions. You may receive vaccines as individual doses or as more than one vaccine together in one shot (combination vaccines). Talk with your health care provider about  the risks and benefits of combination vaccines. What tests do I need? Blood tests  Lipid and cholesterol levels. These may be checked every 5 years, or more frequently depending on your overall health.  Hepatitis C test.  Hepatitis B test. Screening  Lung cancer screening. You may have this screening every year starting at age 22 if you have a 30-pack-year history of smoking and currently smoke or have quit within the past  15 years.  Colorectal cancer screening. All adults should have this screening starting at age 48 and continuing until age 49. Your health care provider may recommend screening at age 47 if you are at increased risk. You will have tests every 1-10 years, depending on your results and the type of screening test.  Diabetes screening. This is done by checking your blood sugar (glucose) after you have not eaten for a while (fasting). You may have this done every 1-3 years.  Mammogram. This may be done every 1-2 years. Talk with your health care provider about how often you should have regular mammograms.  BRCA-related cancer screening. This may be done if you have a family history of breast, ovarian, tubal, or peritoneal cancers. Other tests  Sexually transmitted disease (STD) testing.  Bone density scan. This is done to screen for osteoporosis. You may have this done starting at age 71. Follow these instructions at home: Eating and drinking  Eat a diet that includes fresh fruits and vegetables, whole grains, lean protein, and low-fat dairy products. Limit your intake of foods with high amounts of sugar, saturated fats, and salt.  Take vitamin and mineral supplements as recommended by your health care provider.  Do not drink alcohol if your health care provider tells you not to drink.  If you drink alcohol: ? Limit how much you have to 0-1 drink a day. ? Be aware of how much alcohol is in your drink. In the [place] one drink equals one 12 oz bottle of beer (355 mL), one 5 oz glass of wine (148 mL), or one 1 oz glass of hard liquor (44 mL). Lifestyle  Take daily care of your teeth and gums.  Stay active. Exercise for at least 30 minutes on 5 or more days each week.  Do not use any products that contain nicotine or tobacco, such as cigarettes, e-cigarettes, and chewing tobacco. If you need help quitting, ask your health care provider.  If you are sexually active, practice safe sex. Use a  condom or other form of protection in order to prevent STIs (sexually transmitted infections).  Talk with your health care provider about taking a low-dose aspirin or statin. What's next?  Go to your health care provider once a year for a well check visit.  Ask your health care provider how often you should have your eyes and teeth checked.  Stay up to date on all vaccines. This information is not intended to replace advice given to you by your health care provider. Make sure you discuss any questions you have with your health care provider. Document Revised: 07/09/2018 Document Reviewed: 07/09/2018 Elsevier Patient Education  2020 [employer].

## 2019-11-03 DIAGNOSIS — Z5181 Encounter for therapeutic drug level monitoring: Secondary | ICD-10-CM | POA: Diagnosis not present

## 2019-11-03 DIAGNOSIS — G894 Chronic pain syndrome: Secondary | ICD-10-CM | POA: Diagnosis not present

## 2019-11-03 DIAGNOSIS — Z79899 Other long term (current) drug therapy: Secondary | ICD-10-CM | POA: Diagnosis not present

## 2019-11-15 ENCOUNTER — Ambulatory Visit: Payer: Medicare HMO | Attending: Internal Medicine

## 2019-11-15 DIAGNOSIS — Z23 Encounter for immunization: Secondary | ICD-10-CM

## 2019-11-15 NOTE — Progress Notes (Signed)
   Covid-19 Vaccination Clinic  Name:  Alexa [name]    MRN: [id number] DOB: [birth year]  11/15/2019  [name] was observed post Covid-19 immunization for 15 minutes without incident. She was provided with Vaccine Information Sheet and instruction to access the V-Safe system.   [name] was instructed to call 911 with any severe reactions post vaccine: Marland Kitchen Difficulty breathing  . Swelling of face and throat  . A fast heartbeat  . A bad rash all over body  . Dizziness and weakness   Immunizations Administered    Name Date Dose VIS Date Route   Pfizer COVID-19 Vaccine 11/15/2019  1:10 PM 0.3 mL 09/22/2018 Intramuscular   Manufacturer: [place]   Lot: [id number]   [place]: [id number]

## 2019-11-17 ENCOUNTER — Encounter (HOSPITAL_BASED_OUTPATIENT_CLINIC_OR_DEPARTMENT_OTHER): Payer: Self-pay | Admitting: *Deleted

## 2019-11-17 ENCOUNTER — Emergency Department (HOSPITAL_BASED_OUTPATIENT_CLINIC_OR_DEPARTMENT_OTHER)
Admission: EM | Admit: 2019-11-17 | Discharge: 2019-11-17 | Disposition: A | Discharge status: Home / Self Care | Payer: Medicare HMO | Attending: Emergency Medicine | Admitting: Emergency Medicine

## 2019-11-17 ENCOUNTER — Telehealth: Payer: Self-pay | Admitting: Family Medicine

## 2019-11-17 ENCOUNTER — Emergency Department (HOSPITAL_BASED_OUTPATIENT_CLINIC_OR_DEPARTMENT_OTHER): Payer: Medicare HMO

## 2019-11-17 ENCOUNTER — Other Ambulatory Visit: Payer: Self-pay

## 2019-11-17 DIAGNOSIS — R531 Weakness: Secondary | ICD-10-CM

## 2019-11-17 DIAGNOSIS — Z20822 Contact with and (suspected) exposure to covid-19: Secondary | ICD-10-CM | POA: Diagnosis not present

## 2019-11-17 DIAGNOSIS — J45909 Unspecified asthma, uncomplicated: Secondary | ICD-10-CM | POA: Diagnosis not present

## 2019-11-17 DIAGNOSIS — Z79899 Other long term (current) drug therapy: Secondary | ICD-10-CM | POA: Insufficient documentation

## 2019-11-17 DIAGNOSIS — I10 Essential (primary) hypertension: Secondary | ICD-10-CM | POA: Diagnosis not present

## 2019-11-17 DIAGNOSIS — Z7982 Long term (current) use of aspirin: Secondary | ICD-10-CM | POA: Insufficient documentation

## 2019-11-17 DIAGNOSIS — N3000 Acute cystitis without hematuria: Secondary | ICD-10-CM | POA: Diagnosis not present

## 2019-11-17 DIAGNOSIS — R0602 Shortness of breath: Secondary | ICD-10-CM | POA: Diagnosis not present

## 2019-11-17 DIAGNOSIS — E119 Type 2 diabetes mellitus without complications: Secondary | ICD-10-CM | POA: Diagnosis not present

## 2019-11-17 DIAGNOSIS — M6281 Muscle weakness (generalized): Secondary | ICD-10-CM | POA: Diagnosis not present

## 2019-11-17 DIAGNOSIS — J449 Chronic obstructive pulmonary disease, unspecified: Secondary | ICD-10-CM | POA: Insufficient documentation

## 2019-11-17 DIAGNOSIS — R5383 Other fatigue: Secondary | ICD-10-CM | POA: Diagnosis present

## 2019-11-17 LAB — CBC WITH DIFFERENTIAL/PLATELET
Abs Immature Granulocytes: 0.02 10*3/uL (ref 0.00–0.07)
Basophils Absolute: 0.1 10*3/uL (ref 0.0–0.1)
Basophils Relative: 1 %
Eosinophils Absolute: 0.7 10*3/uL — ABNORMAL HIGH (ref 0.0–0.5)
Eosinophils Relative: 7 %
HCT: 46.8 % — ABNORMAL HIGH (ref 36.0–46.0)
Hemoglobin: 15.5 g/dL — ABNORMAL HIGH (ref 12.0–15.0)
Immature Granulocytes: 0 %
Lymphocytes Relative: 26 %
Lymphs Abs: 2.5 10*3/uL (ref 0.7–4.0)
MCH: 29.8 pg (ref 26.0–34.0)
MCHC: 33.1 g/dL (ref 30.0–36.0)
MCV: 89.8 fL (ref 80.0–100.0)
Monocytes Absolute: 0.7 10*3/uL (ref 0.1–1.0)
Monocytes Relative: 7 %
Neutro Abs: 5.7 10*3/uL (ref 1.7–7.7)
Neutrophils Relative %: 59 %
Platelets: 280 10*3/uL (ref 150–400)
RBC: 5.21 MIL/uL — ABNORMAL HIGH (ref 3.87–5.11)
RDW: 13.6 % (ref 11.5–15.5)
WBC: 9.7 10*3/uL (ref 4.0–10.5)
nRBC: 0 % (ref 0.0–0.2)

## 2019-11-17 LAB — COMPREHENSIVE METABOLIC PANEL
ALT: 17 U/L (ref 0–44)
AST: 20 U/L (ref 15–41)
Albumin: 3.8 g/dL (ref 3.5–5.0)
Alkaline Phosphatase: 56 U/L (ref 38–126)
Anion gap: 10 (ref 5–15)
BUN: 22 mg/dL (ref 8–23)
CO2: 23 mmol/L (ref 22–32)
Calcium: 9 mg/dL (ref 8.9–10.3)
Chloride: 104 mmol/L (ref 98–111)
Creatinine, Ser: 0.97 mg/dL (ref 0.44–1.00)
GFR calc Af Amer: >60 mL/min (ref >60)
GFR calc non Af Amer: 53 mL/min — ABNORMAL LOW (ref >60)
Glucose, Bld: 130 mg/dL — ABNORMAL HIGH (ref 70–99)
Potassium: 4.3 mmol/L (ref 3.5–5.1)
Sodium: 137 mmol/L (ref 135–145)
Total Bilirubin: 0.9 mg/dL (ref 0.3–1.2)
Total Protein: 6.9 g/dL (ref 6.5–8.1)

## 2019-11-17 LAB — URINALYSIS, ROUTINE W REFLEX MICROSCOPIC
Bilirubin Urine: NEGATIVE
Glucose, UA: NEGATIVE mg/dL
Hgb urine dipstick: NEGATIVE
Ketones, ur: NEGATIVE mg/dL
Nitrite: POSITIVE — AB
Protein, ur: NEGATIVE mg/dL
Specific Gravity, Urine: 1.01 (ref 1.005–1.030)
pH: 6 (ref 5.0–8.0)

## 2019-11-17 LAB — BRAIN NATRIURETIC PEPTIDE: B Natriuretic Peptide: 104.3 pg/mL — ABNORMAL HIGH (ref 0.0–100.0)

## 2019-11-17 LAB — URINALYSIS, MICROSCOPIC (REFLEX)

## 2019-11-17 LAB — TROPONIN I (HIGH SENSITIVITY): Troponin I (High Sensitivity): 12 ng/L (ref <18)

## 2019-11-17 LAB — RESPIRATORY PANEL BY RT PCR (FLU A&B, COVID)
Influenza A by PCR: NEGATIVE
Influenza B by PCR: NEGATIVE
SARS Coronavirus 2 by RT PCR: NEGATIVE

## 2019-11-17 LAB — SEDIMENTATION RATE: Sed Rate: 8 mm/hr (ref 0–22)

## 2019-11-17 LAB — TSH: TSH: 1.276 u[IU]/mL (ref 0.350–4.500)

## 2019-11-17 LAB — CK: Total CK: 78 U/L (ref 38–234)

## 2019-11-17 MED ORDER — AMOXICILLIN-POT CLAVULANATE 875-125 MG PO TABS: 1.0000 | ORAL_TABLET | Freq: Two times a day (BID) | ORAL | 0 refills | Status: DC

## 2019-11-17 MED ORDER — SODIUM CHLORIDE 0.9 % IV SOLN
1.0000 g | Freq: Once | INTRAVENOUS | Status: AC
  Administered 2019-11-17: 1 g via INTRAVENOUS
  Filled 2019-11-17: qty 10

## 2019-11-17 MED ORDER — SODIUM CHLORIDE 0.9 % IV BOLUS
250.0000 mL | Freq: Once | INTRAVENOUS | Status: AC
  Administered 2019-11-17: 250 mL via INTRAVENOUS

## 2019-11-17 NOTE — Telephone Encounter (Signed)
Patient and husband walked into office during 12-1pm lunch hour. Pt complained to lead nurse of feeling weak. It was difficult to elicit information from patient as she continued to reiterate that she is feeling weak and needs to be seen. Pt became a bit agitated and angry and states she spoke with PCP's nurse who told pt to go to nearest ER. She came to our office but it is unclear why since we care not ER or UC or walk-in clinic. I was asked to speak with pt. I introduced myself to the pt and her husband. She was immediately short me me and said "I hope you can help me right now because no one else has". She notes ongoing weakness, worse since 2nd covid vaccine 2 days ago. No fever, chills. No new myalgias or arthralgias but pt states "I always hurt all over". When asked if she has been increasing her water/fluid intake, pt responded with "my usual amount and I've been out I the yard some and doing what I have to do". She was not able to quantify her water intake. Pt then asked "are you going to help me or not". She does appear weak and unsteady and we had pt sit in chair in waiting area. I explained she may need IVF, labs, possibly EKG and that we cannot administer IVF here. I states it may be best, since husband does not drive and pt is weak/unsteady, that we call an ambulance to take her to [place] [facility] for eval. Pt stood up and yelled to her husband to "come on" and left without responding to my multiple requests to wait and sit back down.

## 2019-11-17 NOTE — ED Notes (Signed)
Pt on monitor 

## 2019-11-17 NOTE — Telephone Encounter (Signed)
Pt arrived at [place] [facility]. Just FYI

## 2019-11-17 NOTE — Discharge Instructions (Signed)
1.  You have been given a prescription for Augmentin.  Start your Augmentin tomorrow at approximately 5 PM.  Take twice daily as prescribed. 2.  Rest, stay hydrated and eat small meals over the next 1 to 2 days.  You should start to feel improvement. 3.  Make an appointment to see your doctor for recheck at the end of the week or the beginning of next week. 4.  Return to the emergency department if you have worsening or concerning symptoms.

## 2019-11-17 NOTE — Telephone Encounter (Signed)
Thanks Stanton [facility]- I am also not sure why they went to your office. Alisyn can be challenging to work with, will ask my nurse to call them    Hi Mel Almond- can you please call them and get her set up for follow-up with me JC

## 2019-11-17 NOTE — ED Provider Notes (Signed)
[facility]ER[facility] DEPART[facility] Provider Note   CSN: [id number] Arrival date & time: 11/17/19  1328     History Chief Complaint  Patient presents with  . Fatigue    Alexa [name] is a 86 y.o. female.  HPI Patient reports she has had general fatigue for about a month.  She has noticed that is worse for the past week.  She reports typically she is active and taking care of things in her home.  She reports now she can only do a few chores and she has to rest.  She ties the symptoms to getting her Covid vaccine although she is not really sure that is what caused the problem.  She reports she is continue to eat small amounts but her appetite has been decreased.  She reports she did eat a small amount of pancake and some coffee this morning as well as a candy bar for lunch.  She has not had any passing out episodes.  No chest pain or shortness of breath.  No cough.  No swelling of the extremities.    Past Medical History:  Diagnosis Date  . Asthma   . Chronic obstructive pulmonary disease ([place])   . Diverticulosis   . Diverticulosis of colon (without mention of hemorrhage)   . Esophageal reflux   . History of colonic polyps 06/05/05   hyperplastic  . Internal hemorrhoids   . Irritable bowel syndrome   . Ischemic colitis ([place])   . Malignant neoplasm of kidney and other and unspecified urinary organs    renal carcinoma, left nephrectomy in 2008-per patient.  . Other and unspecified hyperlipidemia   . Personal history of venous thrombosis and embolism    PTE after nephrectomy  . Spinal stenosis   . Type II or unspecified type diabetes mellitus without mention of complication, not stated as uncontrolled 02/05/2012   pt states it was in 2007  . Unspecified essential hypertension     Patient Active Problem List   Diagnosis Date Noted  . Solitary kidney 12/15/2018  . Cough variant asthma 12/29/2014  . Spinal stenosis in cervical region 12/13/2014  . Radiculopathy, lumbar  region 12/13/2014  . Depression 05/03/2008  . PERONEAL NEUROPATHY 05/03/2008  . Malignant neoplasm of kidney excluding renal pelvis ([place]) 08/06/2007  . Controlled type 2 diabetes mellitus without complication, without long-term current use of insulin ([place]) 08/06/2007  . GERD 08/06/2007  . PULMONARY EMBOLISM, HX OF 08/06/2007  . Hyperlipidemia 09/03/2006  . Essential hypertension 09/03/2006  . IBS 09/03/2006    Past Surgical History:  Procedure Laterality Date  . BREAST BIOPSY    . COLONOSCOPY W/ POLYPECTOMY  2000  . COLONOSCOPY W/ POLYPECTOMY  07/2004   Hyperplastic polyps  . DILATION AND CURETTAGE OF UTERUS  03/2005   Uterine Polyps  . NEPHRECTOMY  2007   right,Dr Dahlstedt  . SEPTOPLASTY    . TONSILLECTOMY       OB History   No obstetric history on file.     Family History  Problem Relation Age of Onset  . Stroke Mother        onset in 42s  . Diabetes Mother   . Cancer Mother        bladder; nephrectomy for calculi/also uterine , TAH & BSO  . Aneurysm Mother        thoracic  . Depression Mother   . Stroke Brother   . Heart failure Brother   . Heart attack Other  maternal family history  . Heart failure Maternal Grandfather   . Lung cancer Father        smoked  . Heart failure Sister   . Depression Sister   . Alzheimer's disease Sister   . COPD Sister   . Aneurysm Brother        cns  . Depression Maternal Aunt        X2  . Bipolar disorder Sister   . Alcoholism Neg Hx     Social History   Tobacco Use  . Smoking status: Never Smoker  . Smokeless tobacco: Never Used  Substance Use Topics  . Alcohol use: No  . Drug use: No    Home Medications Prior to Admission medications   Medication Sig Start Date End Date Taking? Authorizing Provider  albuterol (PROAIR HFA) 108 (90 Base) MCG/ACT inhaler Inhale 1-2 puffs into the lungs every 6 (six) hours as needed for wheezing or shortness of breath. 09/30/19   Copland, Gay Filler, MD    amoxicillin-clavulanate (AUGMENTIN) 875-125 MG tablet Take 1 tablet by mouth 2 (two) times daily. One po bid x 7 days 11/17/19   Charlesetta Shanks, MD  Ascorbic Acid (VITAMIN C) POWD Take 1 packet by mouth 2 (two) times daily as needed (cold symptoms).    [provider]  aspirin EC 81 MG tablet Take 162 mg by mouth daily.    [provider]  Benzalkonium Chloride (DIABETIC BASICS HEALTHY FOOT EX) Apply 1 application topically daily as needed (foot pain). OTC diabetic foot cream    [provider]  Blood Glucose Monitoring Suppl (TRUERESULT BLOOD GLUCOSE) W/DEVICE KIT Use to test blood sugar ICD 10 E11 9 07/14/14   Hendricks Limes, MD  buPROPion (WELLBUTRIN) 100 MG tablet TAKE 1 TABLET(100 MG) BY MOUTH TWICE DAILY 04/08/19   Copland, Gay Filler, MD  busPIRone (BUSPAR) 7.5 MG tablet TAKE 1 TABLET BY MOUTH(7.5 MG TOTAL) TWICE A DAY MAY INCREASE TO 2 TABLETS TWICE A DAY IF NEEDED 01/28/19   Copland, Gay Filler, MD  CALCIUM PO Take 1 tablet by mouth daily.    [provider]  Cholecalciferol (VITAMIN D PO) Take 1 tablet by mouth daily.    [provider]  CRANBERRY PO Take 2 capsules by mouth 2 (two) times daily.     [provider]  ESTRACE VAGINAL 0.1 MG/GM vaginal cream Apply locally every other night at bedtime 01/16/16   [provider]  fluticasone (FLONASE) 50 MCG/ACT nasal spray INSTILL 2 SPRAYS INTO EACH NOSTRIL ONCE DAILY 12/16/18   Copland, Gay Filler, MD  gabapentin (NEURONTIN) 100 MG capsule Take one capsule by mouth daily for 3 days, then increase to twice a day for 3 days, then 1 three times a day for maintenance dose 08/27/19   Copland, Jessica C, MD  glucose blood (TRUETEST TEST) test strip Use to test blood sugar ICD 10 E11 9 07/14/14   Hendricks Limes, MD  HYDROcodone-acetaminophen (NORCO/VICODIN) 5-325 MG tablet Take 1 tablet by mouth every 6 (six) hours as needed. Do not take with tramadol or any other sedative 04/09/19   Copland,  Gay Filler, MD  Multiple Vitamins-Minerals (EYE VITAMINS PO) Take by mouth.    [provider]  polyethylene glycol powder (GLYCOLAX/MIRALAX) powder Take 0.5 Containers by mouth daily as needed for mild constipation. Use as needed 11/15/15   [provider]  SYMBICORT 80-4.5 MCG/ACT inhaler INHALE 2 PUFFS BY MOUTH TWICE A DAY 05/25/18   Copland, Gay Filler,  MD  triamcinolone (KENALOG) 0.025 % cream APPLY 1 APPLICATION TOPICALLY TWICE DAILY AS NEEDED FOR DRY SKIN ON BACK 09/17/19   Copland, Gay Filler, MD  TRUEPLUS LANCETS 28G MISC Use to test blood sugar ICD 10 E11 9 07/14/14   Hendricks Limes, MD  valsartan (DIOVAN) 40 MG tablet Take 2.5 tablets (100 mg) once daily for blood pressure 03/31/19   Copland, Gay Filler, MD    Allergies    Lisinopril, Codeine, and Verapamil  Review of Systems   Review of Systems 10 Systems reviewed and are negative for acute change except as noted in the HPI.  Physical Exam Updated Vital Signs BP (!) 177/94 (BP Location: Right Arm)   Pulse 84   Temp 98.4 F (36.9 C)   Resp 16   Ht 5' 0.5" (1.537 m)   Wt 58.1 kg   SpO2 99%   BMI 24.59 kg/m   Physical Exam Constitutional:      Comments: Patient is alert and nontoxic.  No respiratory distress.  HENT:     Nose: Nose normal.     Mouth/Throat:     Mouth: Mucous membranes are moist.     Pharynx: Oropharynx is clear. No oropharyngeal exudate.  Eyes:     Extraocular Movements: Extraocular movements intact.     Conjunctiva/sclera: Conjunctivae normal.  Cardiovascular:     Rate and Rhythm: Normal rate and regular rhythm.     Pulses: Normal pulses.     Heart sounds: Normal heart sounds.  Pulmonary:     Effort: Pulmonary effort is normal.     Breath sounds: Normal breath sounds.  Abdominal:     General: There is no distension.     Palpations: Abdomen is soft.     Tenderness: There is no abdominal tenderness. There is no guarding.  Musculoskeletal:        General: No swelling or  tenderness. Normal range of motion.     Cervical back: Neck supple.     Right lower leg: No edema.     Left lower leg: No edema.  Skin:    General: Skin is warm and dry.  Neurological:     General: No focal deficit present.     Mental Status: She is oriented to person, place, and time.     Motor: No weakness.     Coordination: Coordination normal.  Psychiatric:        Mood and Affect: Mood normal.     ED Results / Procedures / Treatments   Labs (all labs ordered are listed, but only abnormal results are displayed) Labs Reviewed  CBC WITH DIFFERENTIAL/PLATELET - Abnormal; Notable for the following components:      Result Value   RBC 5.21 (*)    Hemoglobin 15.5 (*)    HCT 46.8 (*)    Eosinophils Absolute 0.7 (*)    All other components within normal limits  COMPREHENSIVE METABOLIC PANEL - Abnormal; Notable for the following components:   Glucose, Bld 130 (*)    GFR calc non Af Amer 53 (*)    All other components within normal limits  URINALYSIS, ROUTINE W REFLEX MICROSCOPIC - Abnormal; Notable for the following components:   APPearance CLOUDY (*)    Nitrite POSITIVE (*)    Leukocytes,Ua MODERATE (*)    All other components within normal limits  BRAIN NATRIURETIC PEPTIDE - Abnormal; Notable for the following components:   B Natriuretic Peptide 104.3 (*)    All other components within normal limits  URINALYSIS,  MICROSCOPIC (REFLEX) - Abnormal; Notable for the following components:   Bacteria, UA MANY (*)    All other components within normal limits  RESPIRATORY PANEL BY RT PCR (FLU A&B, COVID)  URINE CULTURE  CK  SEDIMENTATION RATE  TSH  TROPONIN I (HIGH SENSITIVITY)  TROPONIN I (HIGH SENSITIVITY)    EKG EKG Interpretation  Date/Time:  Wednesday November 17 2019 16:24:34 EDT Ventricular Rate:  73 PR Interval:    QRS Duration: 83 QT Interval:  383 QTC Calculation: 422 R Axis:   -39 Text Interpretation: Sinus rhythm Multiple premature complexes, vent & supraven  Left axis deviation Minimal ST depression, lateral leads agree, no sig change from previous Confirmed by Charlesetta Shanks [phone]) on 11/17/2019 4:29:27 PM   Radiology DG Chest Port 1 View  Result Date: 11/17/2019 CLINICAL DATA:  Short of breath, fatigue and weakness for 1 month EXAM: PORTABLE CHEST 1 VIEW COMPARISON:  08/25/2019 FINDINGS: Single frontal view of the chest demonstrates an unremarkable cardiac silhouette. Mild ectasia and atherosclerosis of the thoracic aorta unchanged. Chronic interstitial scarring without airspace disease, effusion, or pneumothorax. IMPRESSION: 1. Stable exam, no acute process. Electronically Signed   By: Randa Ngo M.D.   On: 11/17/2019 16:47    Procedures Procedures (including critical care time)  Medications Ordered in ED Medications  cefTRIAXone (ROCEPHIN) 1 g in sodium chloride 0.9 % 100 mL IVPB (1 g Intravenous New Bag/Given 11/17/19 1641)  sodium chloride 0.9 % bolus 250 mL (250 mLs Intravenous New Bag/Given 11/17/19 1640)    ED Course  I have reviewed the triage vital signs and the nursing notes.  Pertinent labs & imaging results that were available during my care of the patient were reviewed by me and considered in my medical decision making (see chart for details).    MDM Rules/Calculators/A&P                      Patient presents with symptoms predominantly of fatigue and decreased energy level which has been persistent for a number of weeks.  It is interfering with her ability to get usual tasks done.  She has to rest a lot more.  No significant focal symptoms.  No chest pain, no fever.  Patient reports she did have UTIs in the past but has not had one in a couple of years.  Urinalysis is grossly positive.  I highly suspect this is the etiology of general malaise and fatigue has been persistent.  Patient is otherwise clinically well in appearance.  Diagnostic studies otherwise within normal limits.  Patient has recently been vaccinated for  Covid.  Chest x-ray shows no infiltrates and Covid testing is negative.  At this time patient is stable for discharge.  She will start Augmentin tomorrow.  First dose of Rocephin provided in the emergency department.  Antibiotic choice based on culture and sensitivity from most recent microbiology.  Culture will be pending.  Return precautions reviewed. Final Clinical Impression(s) / ED Diagnoses Final diagnoses:  Generalized weakness  Fatigue, unspecified type  Acute cystitis without hematuria    Rx / DC Orders ED Discharge Orders         Ordered    amoxicillin-clavulanate (AUGMENTIN) 875-125 MG tablet  2 times daily     11/17/19 1739           Charlesetta Shanks, MD 11/17/19 1745

## 2019-11-17 NOTE — ED Triage Notes (Signed)
Pt c/o generalized fatigue and weakness  X 53month after 1st covid vacc

## 2019-11-18 NOTE — Telephone Encounter (Signed)
Left message to return call to schedule follow up

## 2019-11-19 LAB — URINE CULTURE: Culture: >=100000 — AB

## 2019-11-20 ENCOUNTER — Telehealth: Payer: Self-pay | Admitting: Emergency Medicine

## 2019-11-20 NOTE — Telephone Encounter (Signed)
Post ED Visit - Positive Culture Follow-up: Unsuccessful Patient Follow-up  Culture assessed and recommendations reviewed by:  []  Elenor Quinones, Pharm.D. []  Heide Guile, Pharm.D., BCPS AQ-ID []  Parks Neptune, Pharm.D., BCPS []  Alycia Rossetti, Pharm.D., BCPS []  [place], [place].D., BCPS, AAHIVP []  Legrand Como, Pharm.D., BCPS, AAHIVP [x]  Duanne Limerick, PharmD []  Vincenza Hews, PharmD, BCPS  Positive urine culture  []  Patient discharged without antimicrobial prescription and treatment is now indicated [x]  Organism is resistant to prescribed ED discharge antimicrobial []  Patient with positive blood cultures   Unable to contact patient at phone number on file - mailbox full, letter will be sent to address on file  Plan: Stop Augmentin Start Keflex 500 mg PO BID x five days, [employer] PA  [place] C Reeta Kuk 11/20/2019, 4:50 PM

## 2019-11-23 ENCOUNTER — Telehealth: Payer: Self-pay | Admitting: Diagnostic Radiology

## 2019-11-23 NOTE — Telephone Encounter (Signed)
   Went to chart to check medication list, however, pt is calling for her husband

## 2019-11-24 ENCOUNTER — Telehealth: Payer: Self-pay | Admitting: Family Medicine

## 2019-11-24 DIAGNOSIS — R0981 Nasal congestion: Secondary | ICD-10-CM

## 2019-11-24 DIAGNOSIS — J449 Chronic obstructive pulmonary disease, unspecified: Secondary | ICD-10-CM

## 2019-11-24 MED ORDER — FLUTICASONE PROPIONATE 50 MCG/ACT NA SUSP: NASAL | 3 refills | Status: DC

## 2019-11-24 MED ORDER — ALBUTEROL SULFATE HFA 108 (90 BASE) MCG/ACT IN AERS: 1.0000 | INHALATION_SPRAY | Freq: Four times a day (QID) | RESPIRATORY_TRACT | 0 refills | Status: DC | PRN

## 2019-11-24 NOTE — Telephone Encounter (Signed)
Medication: albuterol (PROAIR HFA) 108 (90 Base) MCG/ACT inhaler [id number]  fluticasone (FLONASE) 50 MCG/ACT nasal spray [id number]    Has the patient contacted their pharmacy? No. (If no, request that the patient contact the pharmacy for the refill.) (If yes, when and what did the pharmacy advise?)  Preferred Pharmacy (with phone number or street name): [facility] [id number] [address], [place] [address] [place]  [address] [place] [address]  Phone:  [phone] Fax:  [phone]  DEA #:  [id number]  Agent: Please be advised that RX refills may take up to 3 business days. We ask that you follow-up with your pharmacy.

## 2019-11-24 NOTE — Progress Notes (Signed)
  Chronic Care Management   Note  11/24/2019 Name: [name] MRN: [id number] DOB: [birth year]  Alexa [name] is a 86 y.o. year old female who is a primary care patient of Copland, Gay Filler, MD. I reached out to [place] by phone today in response to a referral sent by [name]'s PCP, Copland, Gay Filler, MD.   Alexa [name] was given information about Chronic Care Management services today including:  1. CCM service includes personalized support from designated clinical staff supervised by her physician, including individualized plan of care and coordination with other care providers 2. 24/7 contact phone numbers for assistance for urgent and routine care needs. 3. Service will only be billed when office clinical staff spend 20 minutes or more in a month to coordinate care. 4. Only one practitioner may furnish and bill the service in a calendar month. 5. The patient may stop CCM services at any time (effective at the end of the month) by phone call to the office staff.   Patient agreed to services and verbal consent obtained.    This note is not being shared with the patient for the following reason: To respect privacy (The patient or proxy has requested that the information not be shared).  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

## 2019-11-24 NOTE — Telephone Encounter (Signed)
Medication refilled

## 2019-11-25 ENCOUNTER — Ambulatory Visit: Payer: Medicare HMO | Admitting: Family Medicine

## 2019-11-25 ENCOUNTER — Telehealth: Payer: Self-pay

## 2019-11-25 NOTE — Telephone Encounter (Signed)
Post ED Visit - Positive Culture Follow-up: Successful Patient Follow-Up  Culture assessed and recommendations reviewed by:  []  Elenor Quinones, Pharm.D. []  Heide Guile, Pharm.D., BCPS AQ-ID []  Parks Neptune, Pharm.D., BCPS []  Alycia Rossetti, Pharm.D., BCPS []  [place], [place].D., BCPS, AAHIVP []  Legrand Como, Pharm.D., BCPS, AAHIVP []  Salome Arnt, PharmD, BCPS []  Johnnette Gourd, PharmD, BCPS []  [place] Better, PharmD, BCPS []  Leeroy Cha, PharmD Pharm D Positive urine culture  []  Patient discharged without antimicrobial prescription and treatment is now indicated [x]  Organism is resistant to prescribed ED discharge antimicrobial []  Patient with positive blood cultures  Changes discussed with ED provider: Pati Gallo PA New antibiotic prescription Keflex 500 mg PO x 5 days Called to [place]  [phone]  Contacted patient, date 11/25/19, time 1514   Hava Massingale, Carolynn Comment 11/25/2019, 3:11 PM

## 2019-11-28 NOTE — Progress Notes (Addendum)
[name] at [facility] [place], [place], [place] [address] [phone]  Date:  11/29/2019   Name:  Alexa [name]   DOB:  Oct 27, 1929   MRN:  [id number]  PCP:  Darreld Mclean, MD    Chief Complaint: Hospitalization Follow-up (weakness)   History of Present Illness:  Alexa [name] is a 86 y.o. very pleasant female patient who presents with the following:  Elderly lady with history of diabetes, hypertension, hyperlipidemia, spinal stenosis, solitary kidney status post nephrectomy for renal cancer Dr. Nelva Bush has been treating her back pain with hydrocodone  10/29/2019  1   10/29/2019  Hydrocodone-Acetamin 5-325 MG  70.00  17 Ca Kno   [id number]   [facility] ([id number])   0  20.59 MME  Medicare   [place]  10/04/2019  1   10/04/2019  Hydrocodone-Acetamin 5-325 MG  70.00  17 Ca Kno   [id number]   [facility] ([id number])   0  20.59 MME  Medicare   [place]  09/06/2019  1   09/06/2019  Hydrocodone-Acetamin 5-325 MG  70.00  17 Ca Kno   [id number]   [facility] ([id number])   0  20.59 MME  Medicare   [place]  08/09/2019  1   08/09/2019  Hydrocodone-Acetamin 5-325 MG  70.00  17 Ca Kno   [id number]   [facility] ([id number])   0  20.59 MME  Medicare   [place]  07/16/2019  1   07/07/2019  Hydrocodone-Acetamin 5-325 MG  70.00  17 Ca Kno   [id number]   [facility] ([id number])   0  20.59 MME  Medicare   [place]  07/05/2019  1   07/05/2019  Hydrocodone-Acetamin 5-325 MG  30.00  7 Ca Kno   [id number]   [facility] ([id number])   0  21.43 MME  Comm Ins   [place]  06/21/2019  1   06/21/2019  Hydrocodone-Acetamin 5-325 MG  30.00  7 Ca Kno   [id number]   [facility] ([id number])   0  21.43 MME       I last saw her in January  She recently appeared at another [place] location on April 21 demanding to be seen, it seems she was confused and thought it was an emergency department or urgent care She was directed to the med center emergency room, where she was seen for complaint of generalized weakness and fatigue The ER felt she might have a UTI, she was given Rocephin and started on Augmentin Urine  culture was indeed positive for E. Coli-some resistance was noted, I do not see where sensitivity to Augmentin was tested.  She was called by ER staff and antibiotic changed to Keflex in response to urine culture on April 29  Today Janaki notes that she continues to feel very weak and tired, which has been a persistent complete over the last few years Of note she takes care of her great-grandchildren for several hours after school every day, and tends to have more responsibility in the family then might be appropriate for someone her age.  Her husband is several years older than she is Pt is not sure if the antibiotic is helping her or not  Pt notes that she is feeling "weak as I can be" for 2 months but has no other specific symptoms.  No cough, fever, abdominal pain, diarrhea, vomiting  We will reculture urine today Eye exam- I reminded her to do this asap  Needs A1c- do today  Covid  vaccine is complete Suggest Shingrix and tetanus booster  Pt is trying to get on medicaid to help with expenses   Here today with her husband Bill  BP Readings from Last 3 Encounters:  11/29/19 (!) 179/89  11/17/19 (!) 177/94  08/25/19 (!) 178/97   She did take her valsartan today -she is currently taking 100 mg.  Her blood pressure has been persistently elevated, she is okay with making an adjustment  Patient Active Problem List   Diagnosis Date Noted  . Solitary kidney 12/15/2018  . Cough variant asthma 12/29/2014  . Spinal stenosis in cervical region 12/13/2014  . Radiculopathy, lumbar region 12/13/2014  . Depression 05/03/2008  . PERONEAL NEUROPATHY 05/03/2008  . Malignant neoplasm of kidney excluding renal pelvis ([place]) 08/06/2007  . Controlled type 2 diabetes mellitus without complication, without long-term current use of insulin ([place]) 08/06/2007  . GERD 08/06/2007  . PULMONARY EMBOLISM, HX OF 08/06/2007  . Hyperlipidemia 09/03/2006  . Essential hypertension 09/03/2006  . IBS 09/03/2006     Past Medical History:  Diagnosis Date  . Asthma   . Chronic obstructive pulmonary disease ([place])   . Diverticulosis   . Diverticulosis of colon (without mention of hemorrhage)   . Esophageal reflux   . History of colonic polyps 06/05/05   hyperplastic  . Internal hemorrhoids   . Irritable bowel syndrome   . Ischemic colitis ([place])   . Malignant neoplasm of kidney and other and unspecified urinary organs    renal carcinoma, left nephrectomy in 2008-per patient.  . Other and unspecified hyperlipidemia   . Personal history of venous thrombosis and embolism    PTE after nephrectomy  . Spinal stenosis   . Type II or unspecified type diabetes mellitus without mention of complication, not stated as uncontrolled 02/05/2012   pt states it was in 2007  . Unspecified essential hypertension     Past Surgical History:  Procedure Laterality Date  . BREAST BIOPSY    . COLONOSCOPY W/ POLYPECTOMY  2000  . COLONOSCOPY W/ POLYPECTOMY  07/2004   Hyperplastic polyps  . DILATION AND CURETTAGE OF UTERUS  03/2005   Uterine Polyps  . NEPHRECTOMY  2007   right,Dr Dahlstedt  . SEPTOPLASTY    . TONSILLECTOMY      Social History   Tobacco Use  . Smoking status: Never Smoker  . Smokeless tobacco: Never Used  Substance Use Topics  . Alcohol use: No  . Drug use: No    Family History  Problem Relation Age of Onset  . Stroke Mother        onset in 19s  . Diabetes Mother   . Cancer Mother        bladder; nephrectomy for calculi/also uterine , TAH & BSO  . Aneurysm Mother        thoracic  . Depression Mother   . Stroke Brother   . Heart failure Brother   . Heart attack Other        maternal family history  . Heart failure Maternal Grandfather   . Lung cancer Father        smoked  . Heart failure Sister   . Depression Sister   . Alzheimer's disease Sister   . COPD Sister   . Aneurysm Brother        cns  . Depression Maternal Aunt        X2  . Bipolar disorder Sister   .  Alcoholism Neg Hx     Allergies  Allergen Reactions  . Lisinopril     Cough D/Ced by Dr Melvyn Novas  . Codeine     nausea  . Verapamil     Pain in feet    Medication list has been reviewed and updated.  Current Outpatient Medications on File Prior to Visit  Medication Sig Dispense Refill  . albuterol (PROAIR HFA) 108 (90 Base) MCG/ACT inhaler Inhale 1-2 puffs into the lungs every 6 (six) hours as needed for wheezing or shortness of breath. 8.5 g 0  . Ascorbic Acid (VITAMIN C) POWD Take 1 packet by mouth 2 (two) times daily as needed (cold symptoms).    Marland Kitchen aspirin EC 81 MG tablet Take 162 mg by mouth daily.    . Benzalkonium Chloride (DIABETIC BASICS HEALTHY FOOT EX) Apply 1 application topically daily as needed (foot pain). OTC diabetic foot cream    . Blood Glucose Monitoring Suppl (TRUERESULT BLOOD GLUCOSE) W/DEVICE KIT Use to test blood sugar ICD 10 E11 9 1 each 0  . buPROPion (WELLBUTRIN) 100 MG tablet TAKE 1 TABLET(100 MG) BY MOUTH TWICE DAILY 60 tablet 5  . busPIRone (BUSPAR) 7.5 MG tablet TAKE 1 TABLET BY MOUTH(7.5 MG TOTAL) TWICE A DAY MAY INCREASE TO 2 TABLETS TWICE A DAY IF NEEDED 120 tablet 6  . CALCIUM PO Take 1 tablet by mouth daily.    . Cholecalciferol (VITAMIN D PO) Take 1 tablet by mouth daily.    Marland Kitchen CRANBERRY PO Take 2 capsules by mouth 2 (two) times daily.     Marland Kitchen ESTRACE VAGINAL 0.1 MG/GM vaginal cream Apply locally every other night at bedtime  1  . fluticasone (FLONASE) 50 MCG/ACT nasal spray INSTILL 2 SPRAYS INTO EACH NOSTRIL ONCE DAILY 48 g 3  . gabapentin (NEURONTIN) 100 MG capsule Take one capsule by mouth daily for 3 days, then increase to twice a day for 3 days, then 1 three times a day for maintenance dose 90 capsule 6  . glucose blood (TRUETEST TEST) test strip Use to test blood sugar ICD 10 E11 9 100 each 3  . HYDROcodone-acetaminophen (NORCO/VICODIN) 5-325 MG tablet Take 1 tablet by mouth every 6 (six) hours as needed. Do not take with tramadol or any other  sedative 15 tablet 0  . Multiple Vitamins-Minerals (EYE VITAMINS PO) Take by mouth.    . polyethylene glycol powder (GLYCOLAX/MIRALAX) powder Take 0.5 Containers by mouth daily as needed for mild constipation. Use as needed    . SYMBICORT 80-4.5 MCG/ACT inhaler INHALE 2 PUFFS BY MOUTH TWICE A DAY 10.2 g 3  . triamcinolone (KENALOG) 0.025 % cream APPLY 1 APPLICATION TOPICALLY TWICE DAILY AS NEEDED FOR DRY SKIN ON BACK 80 g 1  . TRUEPLUS LANCETS 28G MISC Use to test blood sugar ICD 10 E11 9 100 each 3   No current facility-administered medications on file prior to visit.    Review of Systems:  As per HPI- otherwise negative.   Physical Examination: Vitals:   11/29/19 1114  BP: (!) 179/89  Pulse: 65  Resp: 16  Temp: (!) 97.4 F (36.3 C)  SpO2: 96%   Vitals:   11/29/19 1114  Weight: 125 lb (56.7 kg)  Height: 5' 0.5" (1.537 m)   Body mass index is 24.01 kg/m. Ideal Body Weight: Weight in (lb) to have BMI = 25: 129.9  GEN: no acute distress.  Elderly woman sitting in wheelchair today HEENT: Atraumatic, Normocephalic.  Ears and Nose: No external deformity. CV: RRR, No M/G/R. No JVD. No thrill. No  extra heart sounds. PULM: CTA B, no wheezes, crackles, rhonchi. No retractions. No resp. distress. No accessory muscle use. ABD: S, NT, ND, +BS. No rebound. No HSM. EXTR: No c/c/e PSYCH: Normally interactive. Conversant.  Foot exam performed.  She has strong pulses, no skin breakdown   Assessment and Plan: Essential hypertension - Plan: CBC, valsartan (DIOVAN) 160 MG tablet, DISCONTINUED: valsartan (DIOVAN) 160 MG tablet  Hyperlipidemia, unspecified hyperlipidemia type  Controlled type 2 diabetes mellitus without complication, without long-term current use of insulin ([facility]) - Plan: Hemoglobin A1c  Spinal stenosis in cervical region  Acute cystitis without hematuria - Plan: Urine Culture  Weakness - Plan: TSH, Vitamin D (25 hydroxy)  Here today to follow up on recent ER visit  and feeling of generalized weakness. She was diagnosed with a UTI and ER, this may be lingering.  We will check urine culture today Other labs pending as above, will look for any cause of her weakness and fatigue.  I do suspect that some of her symptoms may be due to age.  I have asked her to let me know if getting worse or any other concerns Will plan further follow- up pending labs. Moderate medical decision making today This visit occurred during the SARS-CoV-2 public health emergency.  Safety protocols were in place, including screening questions prior to the visit, additional usage of staff PPE, and extensive cleaning of exam room while observing appropriate contact time as indicated for disinfecting solutions.    Signed Lamar Blinks, MD   Received her labs as below, message to patient  Results for orders placed or performed in visit on 11/29/19  Urine Culture   Specimen: Urine  Result Value Ref Range   MICRO NUMBER: [id number]    SPECIMEN QUALITY: Adequate    Sample Source URINE    STATUS: FINAL    Result: No Growth   Hemoglobin A1c  Result Value Ref Range   Hgb A1c MFr Bld 6.8 (H) 4.6 - 6.5 %  CBC  Result Value Ref Range   WBC 8.6 4.0 - 10.5 K/uL   RBC 4.86 3.87 - 5.11 Mil/uL   Platelets 291.0 150.0 - 400.0 K/uL   Hemoglobin 14.6 12.0 - 15.0 g/dL   HCT 43.3 36.0 - 46.0 %   MCV 89.1 78.0 - 100.0 fl   MCHC 33.7 30.0 - 36.0 g/dL   RDW 13.7 11.5 - 15.5 %  TSH  Result Value Ref Range   TSH 2.15 0.35 - 4.50 uIU/mL  Vitamin D (25 hydroxy)  Result Value Ref Range   VITD 75.49 30.00 - 100.00 ng/mL   Received her urine culture 5/5- negative for infection Message to pt

## 2019-11-28 NOTE — Patient Instructions (Addendum)
It was good to see you again today, I will be in touch with your lab results as soon as possible  I would suggest a tetanus booster and Shingrix series at your pharmacy at your convenience  Let's increase your BP medication- valsartan/diovan- to 160 mg. You will take just ONE of these pills daily to help bring down your BP   Let me know if anything is changing or getting worse, I will be in touch with your reports

## 2019-11-29 ENCOUNTER — Ambulatory Visit (INDEPENDENT_AMBULATORY_CARE_PROVIDER_SITE_OTHER): Payer: Medicare HMO | Admitting: Family Medicine

## 2019-11-29 ENCOUNTER — Encounter: Payer: Self-pay | Admitting: Family Medicine

## 2019-11-29 ENCOUNTER — Other Ambulatory Visit: Payer: Self-pay

## 2019-11-29 VITALS — BP 179/89 | HR 65 | Temp 97.4°F | Resp 16 | Ht 60.5 in | Wt 125.0 lb

## 2019-11-29 DIAGNOSIS — N3000 Acute cystitis without hematuria: Secondary | ICD-10-CM

## 2019-11-29 DIAGNOSIS — I10 Essential (primary) hypertension: Secondary | ICD-10-CM | POA: Diagnosis not present

## 2019-11-29 DIAGNOSIS — E785 Hyperlipidemia, unspecified: Secondary | ICD-10-CM | POA: Diagnosis not present

## 2019-11-29 DIAGNOSIS — E119 Type 2 diabetes mellitus without complications: Secondary | ICD-10-CM | POA: Diagnosis not present

## 2019-11-29 DIAGNOSIS — R531 Weakness: Secondary | ICD-10-CM

## 2019-11-29 DIAGNOSIS — M4802 Spinal stenosis, cervical region: Secondary | ICD-10-CM

## 2019-11-29 LAB — TSH: TSH: 2.15 u[IU]/mL (ref 0.35–4.50)

## 2019-11-29 LAB — CBC
HCT: 43.3 % (ref 36.0–46.0)
Hemoglobin: 14.6 g/dL (ref 12.0–15.0)
MCHC: 33.7 g/dL (ref 30.0–36.0)
MCV: 89.1 fl (ref 78.0–100.0)
Platelets: 291 10*3/uL (ref 150.0–400.0)
RBC: 4.86 Mil/uL (ref 3.87–5.11)
RDW: 13.7 % (ref 11.5–15.5)
WBC: 8.6 10*3/uL (ref 4.0–10.5)

## 2019-11-29 LAB — HEMOGLOBIN A1C: Hgb A1c MFr Bld: 6.8 % — ABNORMAL HIGH (ref 4.6–6.5)

## 2019-11-29 LAB — VITAMIN D 25 HYDROXY (VIT D DEFICIENCY, FRACTURES): VITD: 75.49 ng/mL (ref 30.00–100.00)

## 2019-11-29 MED ORDER — VALSARTAN 160 MG PO TABS: ORAL_TABLET | ORAL | 3 refills | Status: DC

## 2019-11-30 LAB — URINE CULTURE
MICRO NUMBER:: 10431798
Result:: NO GROWTH
SPECIMEN QUALITY:: ADEQUATE

## 2019-12-01 ENCOUNTER — Telehealth: Payer: Self-pay | Admitting: Family Medicine

## 2019-12-01 ENCOUNTER — Encounter: Payer: Self-pay | Admitting: Family Medicine

## 2019-12-01 DIAGNOSIS — R002 Palpitations: Secondary | ICD-10-CM

## 2019-12-01 NOTE — Telephone Encounter (Signed)
Pt had recent EKG at ER which showed multiple PVC.  Will refer to cardiology as per her request.  However Dr Caryl Comes is likely appropriate as he does mosly electrophysiology Called her to discuss No CP, she "always has SOB," no change here.  She actually wants to see Dr Percival Spanish. I will make this referral for her

## 2019-12-01 NOTE — Telephone Encounter (Signed)
Called patient to get a little but more info. She states today she just "feels her heart skipping beats" She states she has checked her bp today its been 137/77 P-82, 154/84 P-85, 154/80 P-82. She states she has been weak for 2 months now. Her brother had to have a pacemaker placed and she is wondering if she could just see Cardiology to make sure everything's ok.

## 2019-12-01 NOTE — Telephone Encounter (Signed)
Patient states she would like to be referred to  Nikki Dom, MD Cardiologist in [place], [place]...  [address], [place], [place] she stated she's been having problems with her heart today. She requested a call back to let her know the referral was placed. Thanks

## 2019-12-13 ENCOUNTER — Ambulatory Visit: Payer: Medicare HMO | Admitting: Cardiology

## 2019-12-15 ENCOUNTER — Other Ambulatory Visit: Payer: Self-pay | Admitting: Family Medicine

## 2019-12-15 DIAGNOSIS — L853 Xerosis cutis: Secondary | ICD-10-CM

## 2019-12-22 DIAGNOSIS — C659 Malignant neoplasm of unspecified renal pelvis: Secondary | ICD-10-CM | POA: Diagnosis not present

## 2020-01-03 ENCOUNTER — Other Ambulatory Visit: Payer: Self-pay

## 2020-01-03 ENCOUNTER — Ambulatory Visit: Payer: Medicare HMO | Admitting: Pharmacist

## 2020-01-03 ENCOUNTER — Telehealth: Payer: Self-pay | Admitting: *Deleted

## 2020-01-03 DIAGNOSIS — E119 Type 2 diabetes mellitus without complications: Secondary | ICD-10-CM

## 2020-01-03 DIAGNOSIS — J449 Chronic obstructive pulmonary disease, unspecified: Secondary | ICD-10-CM

## 2020-01-03 DIAGNOSIS — E785 Hyperlipidemia, unspecified: Secondary | ICD-10-CM

## 2020-01-03 DIAGNOSIS — I10 Essential (primary) hypertension: Secondary | ICD-10-CM

## 2020-01-03 NOTE — Chronic Care Management (AMB) (Signed)
Chronic Care Management Pharmacy  Name: Alexa [name]  MRN: [id number] DOB: Feb 26, 1930  Chief Complaint/ HPI  Alexa [name],  86 y.o. , female presents for their Initial CCM visit with the clinical pharmacist via telephone due to COVID-19 Pandemic.  PCP : Alexa Mclean, MD  Their chronic conditions include: Diabetes, Hypertension, Hyperlipidemia, Asthma, Neuropathy/Pain, Depression, Allergic Rhinitis, Estrogen Deficiency, Rash  Office Visits: 12/01/19: Patient request for cardiologist due to palpitations. Pt referred to Dr. Percival [name]  11/29/19: Visit w/ Dr. Lorelei [name] [facility] follow up due to weakness and fatigue. Pt was treated for UTI with rocephin, then augmentin which was switched to Keflex d/t urine culture. Patient reporting weakness and fatigue. BP elevated. Valsartan increased to 148m daily from 1013mdaily. Labs ordered (a1c, cbc, tsh, vitamin D, urine culture)  10/22/19: Medicare Annual Wellness Exam w/ Alexa PlummerRN - Goal updated to maintain health and independence  08/25/19: Visit w/ Alexa [name] Complaint of foot numbness worsening, UTI symptoms, chest pains, hip pains, SOB. Keflex for UTI, ER referral due to chest pains, restarted Gabapentin 1005mConsult Visit: 11/03/19: Chiropractic Visit w/ Dr. KnoBayard [name] Visit:  11/17/19: [facility] - Generalized weakness. Fatigue x 1 month. Treated for UTI with rocephin and discharged with augmentin.   08/25/19: [facility] - Nonspecific chest pain. Chest X-ray consistent with COPD. EKG unremarkable. Discussed findings with Dr. CopLorelei Pontd agreeable for patient to be sent home.  Medications: Outpatient Encounter Medications as of 01/03/2020  Medication Sig Note  . albuterol (PROAIR HFA) 108 (90 Base) MCG/ACT inhaler Inhale 1-2 puffs into the lungs every 6 (six) hours as needed for wheezing or shortness of breath.   . Ascorbic Acid (VITAMIN C) POWD Take 1 packet by mouth 2 (two) times daily as needed (cold  symptoms).   . aMarland Kitchenpirin EC 81 MG tablet Take 162 mg by mouth daily.   . Benzalkonium Chloride (DIABETIC BASICS HEALTHY FOOT EX) Apply 1 application topically daily as needed (foot pain). OTC diabetic foot cream   . Blood Glucose Monitoring Suppl (TRUERESULT BLOOD GLUCOSE) W/DEVICE KIT Use to test blood sugar ICD 10 E11 9   . buPROPion (WELLBUTRIN) 100 MG tablet TAKE 1 TABLET(100 MG) BY MOUTH TWICE DAILY   . busPIRone (BUSPAR) 7.5 MG tablet TAKE 1 TABLET BY MOUTH(7.5 MG TOTAL) TWICE A Alexa [name] MAY INCREASE TO 2 TABLETS TWICE A Alexa [name] IF NEEDED   . CALCIUM PO Take 1 tablet by mouth daily.   . Cholecalciferol (VITAMIN D PO) Take 1 tablet by mouth daily.   . CMarland KitchenANBERRY PO Take 2 capsules by mouth 2 (two) times daily.    . EMarland KitchenTRACE VAGINAL 0.1 MG/GM vaginal cream Apply locally every other night at bedtime   . fluticasone (FLONASE) 50 MCG/ACT nasal spray INSTILL 2 SPRAYS INTO EACH NOSTRIL ONCE DAILY   . gabapentin (NEURONTIN) 100 MG capsule Take one capsule by mouth daily for 3 days, then increase to twice a Alexa [name] for 3 days, then 1 three times a Alexa [name] for maintenance dose   . glucose blood (TRUETEST TEST) test strip Use to test blood sugar ICD 10 E11 9   . HYDROcodone-acetaminophen (NORCO/VICODIN) 5-325 MG tablet Take 1 tablet by mouth every 6 (six) hours as needed. Do not take with tramadol or any other sedative   . Multiple Vitamins-Minerals (EYE VITAMINS PO) Take by mouth.   . polyethylene glycol powder (GLYCOLAX/MIRALAX) powder Take 0.5 Containers by mouth daily as needed for mild constipation. Use as  needed 09/04/2016: -  . SYMBICORT 80-4.5 MCG/ACT inhaler INHALE 2 PUFFS BY MOUTH TWICE A Alexa [name]   . triamcinolone (KENALOG) 0.025 % cream APPLY 1 APPLICATION TOPICALLY TWICE DAILY AS NEEDED FOR DRY SKIN ON BACK   . TRUEPLUS LANCETS 28G MISC Use to test blood sugar ICD 10 E11 9   . valsartan (DIOVAN) 160 MG tablet Take 1 by mouth once daily for blood pressure    No facility-administered encounter medications on file as  of 01/03/2020.   SDOH Screenings   Alcohol Screen:   . Last Alcohol Screening Score (AUDIT):   Depression (PHQ2-9): Low Risk   . PHQ-2 Score: 0  Financial Resource Strain: Low Risk   . Difficulty of Paying Living Expenses: Not hard at all  Food Insecurity: No Food Insecurity  . Worried About Charity fundraiser in the Last Year: Never true  . Ran Out of Food in the Last Year: Never true  Housing: Low Risk   . Last Housing Risk Score: 0  Physical Activity:   . Days of Exercise per Week:   . Minutes of Exercise per Session:   Social Connections:   . Frequency of Communication with Friends and Family:   . Frequency of Social Gatherings with Friends and Family:   . Attends Religious Services:   . Active Member of Clubs or Organizations:   . Attends Archivist Meetings:   Marland Kitchen Marital Status:   Stress:   . Feeling of Stress :   Tobacco Use: Low Risk   . Smoking Tobacco Use: Never Smoker  . Smokeless Tobacco Use: Never Used  Transportation Needs: No Transportation Needs  . Lack of Transportation (Medical): No  . Lack of Transportation (Non-Medical): No     Current Diagnosis/Assessment:  Goals Addressed   None    Married 68 years.  Has 1 son living. Lost a daughter and grand daughter.  Has 3 great grandchildren. She cares for them after school and during the summer She has 2 dogs (poodle and maltese) and a cat.   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 Keeps vitamins in kitchen  Diabetes   Recent Relevant Labs: Lab Results  Component Value Date/Time   HGBA1C 6.8 (H) 11/29/2019 11:39 AM   HGBA1C 7.3 (H) 12/16/2018 11:57 AM   MICROALBUR 0.9 02/25/2014 12:20 PM   MICROALBUR 0.9 12/07/2007 10:10 AM    A1c goal <7%  Checking BG: Rarely  Patient has failed these meds in past: None noted  Patient is currently controlled on the following medications: None noted   States she has lost weight Going to eye doctor on Friday  Last diabetic Eye  exam: No results found for: HMDIABEYEEXA  Last diabetic Foot exam: No results found for: HMDIABFOOTEX   We discussed: Importance of diet  Plan -Continue control with diet and exercise   Hypertension   CMP Latest Ref Rng & Units 11/17/2019 08/25/2019 12/16/2018  Glucose 70 - 99 mg/dL 130(H) 156(H) 233(H)  BUN 8 - 23 mg/dL 22 23 19   Creatinine 0.44 - 1.00 mg/dL 0.97 0.94 0.98  Sodium 135 - 145 mmol/L 137 139 137  Potassium 3.5 - 5.1 mmol/L 4.3 4.2 4.5  Chloride 98 - 111 mmol/L 104 105 99  CO2 22 - 32 mmol/L 23 24 28   Calcium 8.9 - 10.3 mg/dL 9.0 9.2 9.2  Total Protein 6.5 - 8.1 g/dL 6.9 - 7.0  Total Bilirubin 0.3 - 1.2 mg/dL 0.9 - 0.7  Alkaline Phos 38 -  126 U/L 56 - 71  AST 15 - 41 U/L 20 - 15  ALT 0 - 44 U/L 17 - 15   Kidney Function Lab Results  Component Value Date/Time   CREATININE 0.97 11/17/2019 04:30 PM   CREATININE 0.94 08/25/2019 01:40 PM   CREATININE 1.24 (H) 04/03/2018 03:52 PM   CREATININE 1.08 05/07/2013 05:05 PM   GFR 53.67 (L) 12/16/2018 11:57 AM   GFRNONAA 53 (L) 11/17/2019 04:30 PM   GFRAA >60 11/17/2019 04:30 PM   BP today is:  168/107 pulse 97 (already taken pill) Denies chest pain, headache, or dizziness  Office blood pressures are  BP Readings from Last 3 Encounters:  11/29/19 (!) 179/89  11/17/19 (!) 177/94  08/25/19 (!) 178/97   Blood pressure goal <140/90   Patient has failed these meds in the past: lisinopril (cough) Patient is currently uncontrolled on the following medications: Valsartan 194m daily (increased at 11/29/19 [facility] visit)  Patient checks BP at home 1-2x per week  Patient home BP readings are ranging: 145-150s  She felt dizzy when she fell last week.  Gets a headache when her blood pressure is elevated.  She tries to keep moving. Works mostly in her garden  Cut salt out of her diet due to her mother having a history of stroke.  We discussed Importance of blood pressure control  Plan -Continue current medications    -Continue checking blood pressure 1-2 times per week and record  Hyperlipidemia   Lipid Panel     Component Value Date/Time   CHOL 186 12/16/2018 1157   TRIG 184.0 (H) 12/16/2018 1157   HDL 42.90 12/16/2018 1157   LDLCALC 106 (H) 12/16/2018 1157    LDL goal <100  The ASCVD Risk score (Mikey BussingDC Jr., et al., 2013) failed to calculate for the following reasons:   The 2013 ASCVD risk score is only valid for ages 451to 773  Patient has failed these meds in past: None noted  Patient is currently uncontrolled on the following medications: None  Considering patient's age and desire not to take additional medications, would not recommend initiation of statin therapy  We discussed:  Importance of diet  Plan -Continue control with diet and exercise   Asthma / Tobacco   Last spirometry score: 02/25/2017 FVC: 2.43 114% FEV1: 1.87 119%  Eosinophil count:   Lab Results  Component Value Date/Time   EOSPCT 7 11/17/2019 03:49 PM  %                               Eos (Absolute):  Lab Results  Component Value Date/Time   EOSABS 0.7 (H) 11/17/2019 03:49 PM    Tobacco Status:  Social History   Tobacco Use  Smoking Status Never Smoker  Smokeless Tobacco Never Used    Patient has failed these meds in past: Flovent (listed in D/C meds. No apparent reason for D/C), serevent (efficicay)   Patient is currently controlled on the following medications: Albuterol as needed Using maintenance inhaler regularly? No Frequency of rescue inhaler use:  infrequently   She very rarely feels short of breath.  Last used albuterol about 3 weeks ago. Feels the carpet causes her to have flare ups. Has not used Symbicort as she feels she no longer has asthma  Plan -Continue current medications   Neuropathy/Pain    Patient has failed these meds in past: tramadol (efficacy) Patient is currently controlled on the following  medications: Gabapentin 141m up to three times daily (takes 1-2 daily),  hydrocodone 5-3279mevery 6 hours as needed  Pain Dr. RaNelva Bushs KnBayard Malesprescribes hydrocodone for back pain States she has taken hydrocodone for about 2 years.  She wonders if the hydrocodone could be the cause of her rash.  She stopped taking hydrocodone, and the rash did improve, but she is still itching.  She is now taking tylenol for arthritis every 6 hours.  Neuropathy Feels it is getting better  We discussed:  The possibility of hydrocodone causing her rash  Plan -Continue current medications   Depression    Patient has failed these meds in past: Duloxetine (intolerance) Patient is currently controlled on the following medications: Busipirone 7.3m41m2 tabs twice daily  She states she stopped wellbutrin due to them moving it to a large red pill that she couldn't swallow. States she has not taken this in about a year.  "I'm doing alright"  Plan -Continue current medications   Future Plan -Administer PHQ9  Allergic Rhinitis    Patient has failed these meds in past: None noted  Patient is currently controlled on the following medications: Flonase 84m58m sprays once daily  She states she sometimes uses an allergy pill or children's liquid version of allergy medicine. Can not specify the name. Deduce this is loratadine or cetirizine.   Plan -Continue current medications   Estrogen Deficiency    Patient has failed these meds in past: None noted  Patient is currently controlled on the following medications: Estrace Vaginal cream topically every other night at bedtime (patient uses as needed)   Dr. DahlDorina Hoyertes she uses it when she gets itching in the area. States this occurs when she has constipation.  Plan -Continue current medications   Rash    Patient has failed these meds in past: None noted  Patient is currently stable on the following medications: Triamcinolone 0.025% cream as needed  Has had a rash for over a year now and doesn't know what the  cause is.  States she went to a dermatologist and was told to continue the cream she is currently prescribed. She states she is unsure of the diagnosis.  Interested in a referral to a new dermatologist to determine the cause of her rash. She feels it is more than an eczema type rash. Does report rough patches on her skin. Bumps and sores on her neck. "It's tormenting me".  She wonders which medications could be causing her to itch  We discussed:  The possibility of hydrocodone or gabapentin being the cause of her rash and the possibility for a derm or allergy referral  Plan -Continue current medications  -Coordinate with Dr. CoplLorelei Pontget patient derm and/or allergy referral   Osteopenia / Osteoporosis Screening    Last DEXA Scan: None noted   VITD  Date Value Ref Range Status  11/29/2019 75.49 30.00 - 100.00 ng/mL Final    Patient has failed these meds in past: None noted Patient is currently controlled on the following medications: Vitamin D 2000 units twice daily, Calcium (unknown dosage)  She drinks a lot of milk.  She loves cheese, but it constipates her  States she last received DEXA at her ob/gyn. Was always told her bones were strong. She fell 1week ago. Was working around the yard. She states she hurt her ribs. She states she feels better after 1 week. When she cracked her ribs in the past she states it took months to resolve  We discussed:  Recommend [phone] units of vitamin D daily. Recommend 1200 mg of calcium daily from dietary and supplemental sources.  Plan -Assure daily intake of 122m of calcium daily -Consider DEXA Scan at next visit -Continue current medications  Miscellaneous Meds Omega 3  Vitamin C Aspirin  Benzalkonium chloride Cranberry Preservision   Meds to D/C from list Miralax

## 2020-01-06 NOTE — Patient Instructions (Signed)
Visit Information  Goals Addressed            This Visit's Progress   . Chronic Care Management Pharmacy Care Plan       CARE PLAN ENTRY  Current Barriers:  . Chronic Disease Management support, education, and care coordination needs related to Diabetes, Hypertension, Hyperlipidemia, Asthma, Neuropathy/Pain, Depression, Allergic Rhinitis, Estrogen Deficiency, Rash   Hypertension . Pharmacist Clinical Goal(s): o Over the next 90 days, patient will work with PharmD and providers to achieve BP goal <140/90 . Current regimen:  o Valsartan 160mg  daily . Interventions: o Request patient to check BP 1-2 times per week and record . Patient self care activities - Over the next 90 days, patient will: o Check BP 1-2 times per week, document, and provide at future appointments o Ensure daily salt intake < 2300 mg/Alexa [name]  Hyperlipidemia . Pharmacist Clinical Goal(s): o Over the next 90 days, patient will work with PharmD and providers to achieve LDL goal < 100 . Current regimen:  o Diet and exercise management   . Interventions: o Discussed importance of diet . Patient self care activities - Over the next 90 days, patient will: o Work on reducing cholesterol containing foods  Diabetes . Pharmacist Clinical Goal(s): o Over the next 90 days, patient will work with PharmD and providers to maintain A1c goal <7% . Current regimen:  o Diet and exercise management   . Interventions: o Discussed importance of diet . Patient self care activities - Over the next 90 days, patient will: o Maintain a1c <7%  Health Maintenance . Pharmacist Clinical Goal(s) o Over the next 180 days, patient will work with PharmD and providers to complete health maintenance screenings . Interventions: o Recommend patient to complete DEXA Scan o Discussed importance of consuming 1200mg  of calcium daily through diet and/or supplementation . Patient self care activities - Over the next 180 days, patient  will: o Complete DEXA Scan o Assure consumption of 1200mg  of calcium daily  Rash . Pharmacist Clinical Goal(s) o Over the next 90 days, patient will work with PharmD and providers to reduce symptoms of rash . Current regimen:  o Triamcinolone 0.025% cream as needed . Interventions: o Collaboration with provider regarding disease management (derm/allergy referral?) . Patient self care activities - Over the next 90 days, patient will: o Maintain medication regimen for rash   Medication management . Pharmacist Clinical Goal(s): o Over the next 90 days, patient will work with PharmD and providers to maintain optimal medication adherence . Current pharmacy: [facility] . Interventions o Comprehensive medication review performed. o Continue current medication management strategy . Patient self care activities - Over the next 90 days, patient will: o Focus on medication adherence by filling and taking medications appropriately  o Take medications as prescribed o Report any questions or concerns to PharmD and/or provider(s)  Initial goal documentation        Alexa [name] was given information about Chronic Care Management services today including:  1. CCM service includes personalized support from designated clinical staff supervised by her physician, including individualized plan of care and coordination with other care providers 2. 24/7 contact phone numbers for assistance for urgent and routine care needs. 3. Standard insurance, coinsurance, copays and deductibles apply for chronic care management only during months in which we provide at least 20 minutes of these services. Most insurances cover these services at 100%, however patients may be responsible for any copay, coinsurance and/or deductible if applicable. This service may help you  avoid the need for more expensive face-to-face services. 4. Only one practitioner may furnish and bill the service in a calendar month. 5. The patient may  stop CCM services at any time (effective at the end of the month) by phone call to the office staff.  Patient agreed to services and verbal consent obtained.   The patient verbalized understanding of instructions provided today and agreed to receive a mailed copy of patient instruction and/or educational materials. Telephone follow up appointment with pharmacy team member scheduled for: 04/07/2020  Melvenia Beam Katharin Schneider, PharmD Clinical Pharmacist [facility] [phone]   Cholesterol Content in Foods Cholesterol is a waxy, fat-like substance that helps to carry fat in the blood. The body needs cholesterol in small amounts, but too much cholesterol can cause damage to the arteries and heart. Most people should eat less than 200 milligrams (mg) of cholesterol a Alexa [name]. Foods with cholesterol  Cholesterol is found in animal-based foods, such as meat, seafood, and dairy. Generally, low-fat dairy and lean meats have less cholesterol than full-fat dairy and fatty meats. The milligrams of cholesterol per serving (mg per serving) of common cholesterol-containing foods are listed below. Meat and other proteins  Egg -- one large whole egg has 186 mg.  Veal shank -- 4 oz has 141 mg.  Lean ground [place] (93% lean) -- 4 oz has 118 mg.  Fat-trimmed lamb loin -- 4 oz has 106 mg.  Lean ground beef (90% lean) -- 4 oz has 100 mg.  Lobster -- 3.5 oz has 90 mg.  Pork loin chops -- 4 oz has 86 mg.  Canned salmon -- 3.5 oz has 83 mg.  Fat-trimmed beef top loin -- 4 oz has 78 mg.  Frankfurter -- 1 frank (3.5 oz) has 77 mg.  Crab -- 3.5 oz has 71 mg.  Roasted chicken without skin, white meat -- 4 oz has 66 mg.  Light bologna -- 2 oz has 45 mg.  Deli-cut [place] -- 2 oz has 31 mg.  Canned tuna -- 3.5 oz has 31 mg.  Alexa [name] -- 1 oz has 29 mg.  Oysters and mussels (raw) -- 3.5 oz has 25 mg.  Mackerel -- 1 oz has 22 mg.  Trout -- 1 oz has 20 mg.  Pork sausage -- 1  link (1 oz) has 17 mg.  Salmon -- 1 oz has 16 mg.  Tilapia -- 1 oz has 14 mg. Dairy  Soft-serve ice cream --  cup (4 oz) has 103 mg.  Whole-milk yogurt -- 1 cup (8 oz) has 29 mg.  Cheddar cheese -- 1 oz has 28 mg.  American cheese -- 1 oz has 28 mg.  Whole milk -- 1 cup (8 oz) has 23 mg.  2% milk -- 1 cup (8 oz) has 18 mg.  Cream cheese -- 1 tablespoon (Tbsp) has 15 mg.  Cottage cheese --  cup (4 oz) has 14 mg.  Low-fat (1%) milk -- 1 cup (8 oz) has 10 mg.  Sour cream -- 1 Tbsp has 8.5 mg.  Low-fat yogurt -- 1 cup (8 oz) has 8 mg.  Nonfat Greek yogurt -- 1 cup (8 oz) has 7 mg.  Half-and-half cream -- 1 Tbsp has 5 mg. Fats and oils  Cod liver oil -- 1 tablespoon (Tbsp) has 82 mg.  Butter -- 1 Tbsp has 15 mg.  Lard -- 1 Tbsp has 14 mg.  Bacon grease -- 1 Tbsp has 14 mg.  Mayonnaise -- 1  Tbsp has 5-10 mg.  Margarine -- 1 Tbsp has 3-10 mg. Exact amounts of cholesterol in these foods may vary depending on specific ingredients and brands. Foods without cholesterol Most plant-based foods do not have cholesterol unless you combine them with a food that has cholesterol. Foods without cholesterol include:  Grains and cereals.  Vegetables.  Fruits.  Vegetable oils, such as olive, canola, and sunflower oil.  Legumes, such as peas, beans, and lentils.  Nuts and seeds.  Egg whites. Summary  The body needs cholesterol in small amounts, but too much cholesterol can cause damage to the arteries and heart.  Most people should eat less than 200 milligrams (mg) of cholesterol a Alexa [name]. This information is not intended to replace advice given to you by your health care provider. Make sure you discuss any questions you have with your health care provider. Document Revised: 06/27/2017 Document Reviewed: 03/11/2017 Elsevier Patient Education  [place].

## 2020-01-07 DIAGNOSIS — Z961 Presence of intraocular lens: Secondary | ICD-10-CM | POA: Diagnosis not present

## 2020-01-07 DIAGNOSIS — H0102B Squamous blepharitis left eye, upper and lower eyelids: Secondary | ICD-10-CM | POA: Diagnosis not present

## 2020-01-07 DIAGNOSIS — H0102A Squamous blepharitis right eye, upper and lower eyelids: Secondary | ICD-10-CM | POA: Diagnosis not present

## 2020-01-07 DIAGNOSIS — H353132 Nonexudative age-related macular degeneration, bilateral, intermediate dry stage: Secondary | ICD-10-CM | POA: Diagnosis not present

## 2020-01-07 DIAGNOSIS — H47213 Primary optic atrophy, bilateral: Secondary | ICD-10-CM | POA: Diagnosis not present

## 2020-01-07 DIAGNOSIS — H04123 Dry eye syndrome of bilateral lacrimal glands: Secondary | ICD-10-CM | POA: Diagnosis not present

## 2020-01-09 ENCOUNTER — Other Ambulatory Visit: Payer: Self-pay | Admitting: Family Medicine

## 2020-01-09 DIAGNOSIS — Z1231 Encounter for screening mammogram for malignant neoplasm of breast: Secondary | ICD-10-CM

## 2020-01-09 DIAGNOSIS — R21 Rash and other nonspecific skin eruption: Secondary | ICD-10-CM

## 2020-01-09 DIAGNOSIS — E2839 Other primary ovarian failure: Secondary | ICD-10-CM

## 2020-01-19 DIAGNOSIS — H52209 Unspecified astigmatism, unspecified eye: Secondary | ICD-10-CM | POA: Diagnosis not present

## 2020-01-19 DIAGNOSIS — H5203 Hypermetropia, bilateral: Secondary | ICD-10-CM | POA: Diagnosis not present

## 2020-02-16 ENCOUNTER — Other Ambulatory Visit: Payer: Self-pay

## 2020-02-16 ENCOUNTER — Encounter (HOSPITAL_COMMUNITY): Payer: Self-pay

## 2020-02-16 ENCOUNTER — Emergency Department (HOSPITAL_COMMUNITY)
Admission: EM | Admit: 2020-02-16 | Discharge: 2020-02-16 | Disposition: A | Discharge status: Home / Self Care | Payer: Medicare HMO | Attending: Emergency Medicine | Admitting: Emergency Medicine

## 2020-02-16 DIAGNOSIS — J449 Chronic obstructive pulmonary disease, unspecified: Secondary | ICD-10-CM | POA: Diagnosis not present

## 2020-02-16 DIAGNOSIS — E119 Type 2 diabetes mellitus without complications: Secondary | ICD-10-CM | POA: Insufficient documentation

## 2020-02-16 DIAGNOSIS — R21 Rash and other nonspecific skin eruption: Secondary | ICD-10-CM | POA: Diagnosis present

## 2020-02-16 DIAGNOSIS — I10 Essential (primary) hypertension: Secondary | ICD-10-CM | POA: Insufficient documentation

## 2020-02-16 DIAGNOSIS — Z79899 Other long term (current) drug therapy: Secondary | ICD-10-CM | POA: Diagnosis not present

## 2020-02-16 DIAGNOSIS — Z7982 Long term (current) use of aspirin: Secondary | ICD-10-CM | POA: Insufficient documentation

## 2020-02-16 DIAGNOSIS — L309 Dermatitis, unspecified: Secondary | ICD-10-CM | POA: Diagnosis not present

## 2020-02-16 DIAGNOSIS — Z7951 Long term (current) use of inhaled steroids: Secondary | ICD-10-CM | POA: Diagnosis not present

## 2020-02-16 MED ORDER — FAMOTIDINE 20 MG PO TABS: 20.0000 mg | ORAL_TABLET | Freq: Two times a day (BID) | ORAL | 0 refills | Status: DC

## 2020-02-16 MED ORDER — FAMOTIDINE 20 MG PO TABS
20.0000 mg | ORAL_TABLET | Freq: Once | ORAL | Status: AC
  Administered 2020-02-16: 20 mg via ORAL
  Filled 2020-02-16: qty 1

## 2020-02-16 MED ORDER — PREDNISONE 20 MG PO TABS
60.0000 mg | ORAL_TABLET | Freq: Once | ORAL | Status: AC
  Administered 2020-02-16: 60 mg via ORAL
  Filled 2020-02-16: qty 3

## 2020-02-16 MED ORDER — PREDNISONE 20 MG PO TABS: ORAL_TABLET | ORAL | 0 refills | Status: DC

## 2020-02-16 NOTE — ED Provider Notes (Signed)
[facility]-EMERGENCY DEPT Provider Note   CSN: [id number] Arrival date & time: 02/16/20  2209     History No chief complaint on file.   Alexa [name] is a 86 y.o. female.  The history is provided by the patient.  Rash Location:  Face and shoulder/arm Facial rash location:  Forehead Shoulder/arm rash location:  L arm and R arm Quality: itchiness   Quality: not painful, not swelling and not weeping   Severity:  Moderate Onset quality:  Gradual Duration:  13 months Timing:  Constant Progression:  Waxing and waning Chronicity:  Chronic Context: not plant contact, not pollen, not pregnancy, not sick contacts and not sun exposure   Relieved by:  Nothing Worsened by:  Nothing Ineffective treatments:  None tried Associated symptoms: no abdominal pain, no diarrhea, no fever, no joint pain, no sore throat, no throat swelling, no tongue swelling, no URI, not vomiting and not wheezing   Has seen dermatology without an answer.  Is very stressed about her life.       Past Medical History:  Diagnosis Date  . Asthma   . Chronic obstructive pulmonary disease (HCC)   . Diverticulosis   . Diverticulosis of colon (without mention of hemorrhage)   . Esophageal reflux   . History of colonic polyps 06/05/05   hyperplastic  . Internal hemorrhoids   . Irritable bowel syndrome   . Ischemic colitis (HCC)   . Malignant neoplasm of kidney and other and unspecified urinary organs    renal carcinoma, left nephrectomy in 2008-per patient.  . Other and unspecified hyperlipidemia   . Personal history of venous thrombosis and embolism    PTE after nephrectomy  . Spinal stenosis   . Type II or unspecified type diabetes mellitus without mention of complication, not stated as uncontrolled 02/05/2012   pt states it was in 2007  . Unspecified essential hypertension     Patient Active Problem List   Diagnosis Date Noted  . Solitary kidney 12/15/2018  . Cough variant asthma  12/29/2014  . Spinal stenosis in cervical region 12/13/2014  . Radiculopathy, lumbar region 12/13/2014  . Depression 05/03/2008  . PERONEAL NEUROPATHY 05/03/2008  . Malignant neoplasm of kidney excluding renal pelvis (HCC) 08/06/2007  . Controlled type 2 diabetes mellitus without complication, without long-term current use of insulin (HCC) 08/06/2007  . GERD 08/06/2007  . PULMONARY EMBOLISM, HX OF 08/06/2007  . Hyperlipidemia 09/03/2006  . Essential hypertension 09/03/2006  . IBS 09/03/2006    Past Surgical History:  Procedure Laterality Date  . BREAST BIOPSY    . COLONOSCOPY W/ POLYPECTOMY  2000  . COLONOSCOPY W/ POLYPECTOMY  07/2004   Hyperplastic polyps  . DILATION AND CURETTAGE OF UTERUS  03/2005   Uterine Polyps  . NEPHRECTOMY  2007   right,Dr Dahlstedt  . SEPTOPLASTY    . TONSILLECTOMY       OB History   No obstetric history on file.     Family History  Problem Relation Age of Onset  . Stroke Mother        onset in 40s  . Diabetes Mother   . Cancer Mother        bladder; nephrectomy for calculi/also uterine , TAH & BSO  . Aneurysm Mother        thoracic  . Depression Mother   . Stroke Brother   . Heart failure Brother   . Heart attack Other        maternal family history  .   Heart failure Maternal Grandfather   . Lung cancer Father        smoked  . Heart failure Sister   . Depression Sister   . Alzheimer's disease Sister   . COPD Sister   . Aneurysm Brother        cns  . Depression Maternal Aunt        X2  . Bipolar disorder Sister   . Alcoholism Neg Hx     Social History   Tobacco Use  . Smoking status: Never Smoker  . Smokeless tobacco: Never Used  Substance Use Topics  . Alcohol use: No  . Drug use: No    Home Medications Prior to Admission medications   Medication Sig Start Date End Date Taking? Authorizing Provider  albuterol (PROAIR HFA) 108 (90 Base) MCG/ACT inhaler Inhale 1-2 puffs into the lungs every 6 (six) hours as needed for  wheezing or shortness of breath. Patient not taking: Reported on 01/03/2020 11/24/19   Copland, Gay Filler, MD  Ascorbic Acid (VITAMIN C) POWD Take 1 packet by mouth 2 (two) times daily as needed (cold symptoms).    [provider]  aspirin EC 81 MG tablet Take 162 mg by mouth daily.    [provider]  Benzalkonium Chloride (DIABETIC BASICS HEALTHY FOOT EX) Apply 1 application topically daily as needed (foot pain). OTC diabetic foot cream    [provider]  Blood Glucose Monitoring Suppl (TRUERESULT BLOOD GLUCOSE) W/DEVICE KIT Use to test blood sugar ICD 10 E11 9 07/14/14   Hendricks Limes, MD  buPROPion (WELLBUTRIN) 100 MG tablet TAKE 1 TABLET(100 MG) BY MOUTH TWICE DAILY Patient not taking: Reported on 01/03/2020 04/08/19   Copland, Gay Filler, MD  busPIRone (BUSPAR) 7.5 MG tablet TAKE 1 TABLET BY MOUTH(7.5 MG TOTAL) TWICE A DAY MAY INCREASE TO 2 TABLETS TWICE A DAY IF NEEDED 01/28/19   Copland, Gay Filler, MD  CALCIUM PO Take 1 tablet by mouth daily.    [provider]  Cholecalciferol (VITAMIN D PO) Take 1 tablet by mouth daily. 2000 units    [provider]  CRANBERRY PO Take 2 capsules by mouth 2 (two) times daily. 52m    [provider]  ESTRACE VAGINAL 0.1 MG/GM vaginal cream Apply locally every other night at bedtime 01/16/16   [provider]  famotidine (PEPCID) 20 MG tablet Take 1 tablet (20 mg total) by mouth 2 (two) times daily. 02/16/20   Janney Priego, MD  fluticasone (FLONASE) 50 MCG/ACT nasal spray INSTILL 2 SPRAYS INTO EACH NOSTRIL ONCE DAILY 11/24/19   Copland, JGay Filler MD  gabapentin (NEURONTIN) 100 MG capsule Take one capsule by mouth daily for 3 days, then increase to twice a day for 3 days, then 1 three times a day for maintenance dose 08/27/19   Copland, JGay Filler MD  glucose blood (TRUETEST TEST) test strip Use to test blood sugar ICD 10 E11 9 07/14/14   HHendricks Limes MD  HYDROcodone-acetaminophen (NORCO/VICODIN)  5-325 MG tablet Take 1 tablet by mouth every 6 (six) hours as needed. Do not take with tramadol or any other sedative Patient not taking: Reported on 01/03/2020 04/09/19   Copland, JGay Filler MD  Multiple Vitamins-Minerals (EYE VITAMINS PO) Take by mouth.    [provider]  polyethylene glycol powder (GLYCOLAX/MIRALAX) powder Take 0.5 Containers by mouth daily as needed for mild constipation. Use as needed 11/15/15   [provider]  predniSONE (DELTASONE) 20 MG tablet 3 tabs  po day one, then 2 po daily x 4 days 02/16/20   , , MD  SYMBICORT 80-4.5 MCG/ACT inhaler INHALE 2 PUFFS BY MOUTH TWICE A DAY Patient not taking: Reported on 01/03/2020 05/25/18   Copland, Jessica C, MD  triamcinolone (KENALOG) 0.025 % cream APPLY 1 APPLICATION TOPICALLY TWICE DAILY AS NEEDED FOR DRY SKIN ON BACK 12/15/19   Copland, Jessica C, MD  TRUEPLUS LANCETS 28G MISC Use to test blood sugar ICD 10 E11 9 07/14/14   Hopper, William F, MD  valsartan (DIOVAN) 160 MG tablet Take 1 by mouth once daily for blood pressure 11/29/19   Copland, Jessica C, MD    Allergies    Lisinopril, Codeine, and Verapamil  Review of Systems   Review of Systems  Constitutional: Negative for fever.  HENT: Negative for sore throat.   Eyes: Negative for visual disturbance.  Respiratory: Negative for wheezing.   Gastrointestinal: Negative for abdominal pain, diarrhea and vomiting.  Genitourinary: Negative for difficulty urinating.  Musculoskeletal: Negative for arthralgias.  Skin: Positive for rash.  Neurological: Negative for dizziness, facial asymmetry, speech difficulty, weakness and numbness.  Psychiatric/Behavioral: Negative for agitation.  All other systems reviewed and are negative.   Physical Exam Updated Vital Signs BP (!) 165/117 (BP Location: Left Arm)   Pulse 82   Temp 97.6 F (36.4 C) (Oral)   Resp 16   SpO2 97%   Physical Exam Vitals and nursing note reviewed.  Constitutional:      General:  She is not in acute distress.    Appearance: Normal appearance.  HENT:     Head: Normocephalic and atraumatic.     Nose: Nose normal.     Mouth/Throat:     Mouth: Mucous membranes are moist.     Pharynx: Oropharynx is clear.  Eyes:     Conjunctiva/sclera: Conjunctivae normal.     Pupils: Pupils are equal, round, and reactive to light.  Cardiovascular:     Rate and Rhythm: Normal rate and regular rhythm.     Pulses: Normal pulses.     Heart sounds: Normal heart sounds.  Pulmonary:     Effort: Pulmonary effort is normal.     Breath sounds: Normal breath sounds.  Abdominal:     General: Abdomen is flat. Bowel sounds are normal.  Musculoskeletal:        General: Normal range of motion.     Cervical back: Normal range of motion and neck supple.  Skin:    General: Skin is warm and dry.     Capillary Refill: Capillary refill takes less than 2 seconds.     Coloration: Skin is not pale.     Findings: No erythema.  Neurological:     General: No focal deficit present.     Mental Status: She is alert and oriented to person, place, and time.     Cranial Nerves: No cranial nerve deficit.     Sensory: No sensory deficit.     Motor: No weakness.     Deep Tendon Reflexes: Reflexes normal.  Psychiatric:        Mood and Affect: Mood is anxious.     ED Results / Procedures / Treatments   Labs (all labs ordered are listed, but only abnormal results are displayed) Labs Reviewed - No data to display  EKG None  Radiology No results found.  Procedures Procedures (including critical care time)  Medications Ordered in ED Medications  famotidine (PEPCID) tablet 20 mg (has no administration in time   range)  predniSONE (DELTASONE) tablet 60 mg (has no administration in time range)    ED Course  I have reviewed the triage vital signs and the nursing notes.  Pertinent labs & imaging results that were available during my care of the patient were reviewed by me and considered in my  medical decision making (see chart for details).    Patient does not have swelling of the lips tongue or uvula.  Patient is very anxious.  Will treat with steroids and pepcid.  Switch to zyrtec and follow up with dermatology.  Do not take benadryl as it is making you sleepy and dry.  Chevie P Youkhana was evaluated in Emergency Department on 02/16/2020 for the symptoms described in the history of present illness. She was evaluated in the context of the global COVID-19 pandemic, which necessitated consideration that the patient might be at risk for infection with the SARS-CoV-2 virus that causes COVID-19. Institutional protocols and algorithms that pertain to the evaluation of patients at risk for COVID-19 are in a state of rapid change based on information released by regulatory bodies including the CDC and federal and state organizations. These policies and algorithms were followed during the patient's care in the ED.  Final Clinical Impression(s) / ED Diagnoses Final diagnoses:  Dermatitis  Return for intractable cough, coughing up blood,fevers >100.4 unrelieved by medication, shortness of breath, intractable vomiting, chest pain, shortness of breath, weakness,numbness, changes in speech, facial asymmetry,abdominal pain, passing out,Inability to tolerate liquids or food, cough, altered mental status or any concerns. No signs of systemic illness or infection. The patient is nontoxic-appearing on exam and vital signs are within normal limits.   I have reviewed the triage vital signs and the nursing notes. Pertinent labs &imaging results that were available during my care of the patient were reviewed by me and considered in my medical decision making (see chart for details).After history, exam, and medical workup I feel the patient has beenappropriately medically screened and is safe for discharge home. Pertinent diagnoses were discussed with the patient. Patient was given return  precautions.  Rx / DC Orders ED Discharge Orders         Ordered    predniSONE (DELTASONE) 20 MG tablet     Discontinue  Reprint     02/16/20 2322    famotidine (PEPCID) 20 MG tablet  2 times daily     Discontinue  Reprint     02/16/20 2322           , , MD 02/16/20 2327  

## 2020-02-16 NOTE — ED Triage Notes (Signed)
Pt complains of a generalized rash on her arms, face, chest and neck Pt states that she gets this intermittently for a year but doesn't know why, she hasn't changed anything meds, soaps, etc Pt also states that it may be her anxiety because she takes care of grandkids and great grandkids everyday and they just had a tree fall on there house this week Pt has taken benedryl twice tonight without any releif

## 2020-02-21 ENCOUNTER — Telehealth: Payer: Self-pay | Admitting: Family Medicine

## 2020-02-21 NOTE — Telephone Encounter (Signed)
Medication:predniSONE (DELTASONE) 20 MG tablet [[id number]]       Has the patient contacted their pharmacy?  (If no, request that the patient contact the pharmacy for the refill.) (If yes, when and what did the pharmacy advise?)     Preferred Pharmacy (with phone number or street name): [id number] [address], [address]  [place], [place] [address]  Phone:  [phone] Fax:  [phone]      Agent: Please be advised that RX refills may take up to 3 business days. We ask that you follow-up with your pharmacy.

## 2020-02-22 NOTE — Telephone Encounter (Signed)
Tried calling patient, no answer. Prednisone was given by a Dr. Randal Buba. Patient will need to reach out to them.

## 2020-02-23 ENCOUNTER — Telehealth: Payer: Self-pay | Admitting: Family Medicine

## 2020-02-23 NOTE — Telephone Encounter (Signed)
New message:   Pt would like to know if some predniSONE (DELTASONE) 20 MG tablet can be called in for her itching. She states it was prescribed at the ER but she is now out and still itching. Please advise.  [facility] [phone] - [place], [place]

## 2020-02-24 ENCOUNTER — Encounter (HOSPITAL_BASED_OUTPATIENT_CLINIC_OR_DEPARTMENT_OTHER): Payer: Self-pay

## 2020-02-24 ENCOUNTER — Other Ambulatory Visit: Payer: Self-pay

## 2020-02-24 ENCOUNTER — Telehealth: Payer: Self-pay | Admitting: Family Medicine

## 2020-02-24 ENCOUNTER — Emergency Department (HOSPITAL_BASED_OUTPATIENT_CLINIC_OR_DEPARTMENT_OTHER)
Admission: EM | Admit: 2020-02-24 | Discharge: 2020-02-24 | Disposition: A | Discharge status: Home / Self Care | Payer: Medicare HMO | Attending: Emergency Medicine | Admitting: Emergency Medicine

## 2020-02-24 DIAGNOSIS — Z5321 Procedure and treatment not carried out due to patient leaving prior to being seen by health care provider: Secondary | ICD-10-CM | POA: Insufficient documentation

## 2020-02-24 DIAGNOSIS — L299 Pruritus, unspecified: Secondary | ICD-10-CM | POA: Diagnosis not present

## 2020-02-24 NOTE — Telephone Encounter (Signed)
Called new number- no answer, VM is full

## 2020-02-24 NOTE — Telephone Encounter (Signed)
New message:   Pt's sister just called to give an updated number for the pt. She states her new number is [phone]. Please advise.

## 2020-02-24 NOTE — Telephone Encounter (Signed)
New message:   Pt is calling and would like a return call from the MA. Please advise.

## 2020-02-24 NOTE — ED Triage Notes (Addendum)
Pt c/o "itching all over" x 1+ year-states she was seen for same recently and had relief with prednisone-states she contacted PCP and she would not refill prednisone-NAD-steady gait using an umbrella as a cane

## 2020-02-24 NOTE — Telephone Encounter (Signed)
Called pt as mychart message from yesterday not read However number listed does not seem to work Called her son [name] and [facility] that I am trying to reach his mom- ?did she change her number

## 2020-02-25 ENCOUNTER — Other Ambulatory Visit: Payer: Self-pay | Admitting: Family Medicine

## 2020-02-25 DIAGNOSIS — L853 Xerosis cutis: Secondary | ICD-10-CM

## 2020-02-25 MED ORDER — PREDNISONE 20 MG PO TABS: ORAL_TABLET | ORAL | 0 refills | Status: DC

## 2020-02-25 NOTE — Addendum Note (Signed)
Addended by: Lamar Blinks C on: 02/25/2020 09:01 AM   Modules accepted: Orders

## 2020-02-25 NOTE — Telephone Encounter (Signed)
Patient seen in ED. Prescribed prednisone.

## 2020-02-25 NOTE — Telephone Encounter (Signed)
Called pt again- no answer and VM full so I cannot LM She went to the ER last night complaining of itching that that "PCP would not refill her prednisone," left after triage  As I have not been able to reach her to get more information I will call in a lower dose prednisone- 20 mg for 5 days, in hopes of helping to relieve her sx until we are able to connect with her

## 2020-03-01 ENCOUNTER — Telehealth: Payer: Self-pay | Admitting: Family Medicine

## 2020-03-01 DIAGNOSIS — F32A Depression, unspecified: Secondary | ICD-10-CM

## 2020-03-01 MED ORDER — BUPROPION HCL 100 MG PO TABS: ORAL_TABLET | ORAL | 5 refills | Status: DC

## 2020-03-01 NOTE — Telephone Encounter (Signed)
Medication: busPIRone (BUSPAR) 7.5 MG tablet  Has the patient contacted their pharmacy? No. (If no, request that the patient contact the pharmacy for the refill.) (If yes, when and what did the pharmacy advise?)  Preferred Pharmacy (with phone number or street name):[id number] [address], [place] [address]  [address] [place] [address]  Phone:  [phone] Fax:  [phone]  DEA #:  [id number]  Agent: Please be advised that RX refills may take up to 3 business days. We ask that you follow-up with your pharmacy.

## 2020-03-01 NOTE — Telephone Encounter (Signed)
Medication has been sent to pharmacy.  °

## 2020-03-01 NOTE — Progress Notes (Signed)
[facility] [place], [place], [place] [address] [phone]  Date:  03/02/2020   Name:  Alexa [name]   DOB:  [birth year]   MRN:  [id number]  PCP:  Darreld Mclean, MD    Chief Complaint: Hospitalization Follow-up (facial redness, itching, swelling, worsening over last month)   History of Present Illness:  Alexa [name] is a 86 y.o. very pleasant female patient who presents with the following:  Here today for a follow-up visit from recent ER evaluation Elderly lady with history of diabetes, hypertension, hyperlipidemia, spinal stenosis, solitary kidney status post nephrectomy for renal cancer Dr. Nelva Bush has been treating her back pain with hydrocodone  She was seen in the ER on 7/21, with itching of unknown origin.  No labs were done at that time.  She was given a prescription for prednisone.  The patient contacted me a few days later requesting a refill of prednisone.  We tried several times to get in touch with her so I could ask some follow-up questions, we were not able to connect.  She went to the ER again on 7/29, but left after triage. I finally called in Rx for prednisone 20 x 5 days on July 30 in hopes that it would give her some relief until we can talk  Otherwise patient last seen in May of this year  She now notes that she is "itching all over, I'm going crazy" She notes that she has been "itching all over for over a year now" She is using triamcinolone topically-perhaps excessively on her face She notes "big old bumps" all over my neck She saw Dr Ubaldo Glassing for this a few months ago and was told to continue the triamcinolone  No new exposures to allergens that she can think of, no new foods or pets  A large tree fell on their home 2 months ago- thankfully no one was hurt They are living in a hotel right now but should be able to move back into their home eventually  The itching started prior to her moving  into a hotel  Eye exam Due for foot exam Recent A1c showed good control diabetes Lab Results  Component Value Date   HGBA1C 6.8 (H) 11/29/2019  Covid series up-to-date shingrix done also   Patient also complains that she has noticed a left-sided foot drop for a few weeks. She is not really sure when this began. Otherwise her leg strength is normal, but the foot drop is putting her in danger of falling She has not noted any other particular neurologic symptoms  Finally, Doristine and her husband (who is older than she is) are still living on her own and in fact they're responsible for babysitting her GREAT grandchildren quite a bit. Jyll admits that they're getting overwhelmed, she does not think they can continue to live in her own home too much longer and need some help I encouraged her to speak frankly with family members-moving into an assisted living may improve their situation I also advised her that it is okay to put her foot down about the babysitting if it is too much for them now  Patient Active Problem List   Diagnosis Date Noted  . Solitary kidney 12/15/2018  . Cough variant asthma 12/29/2014  . Spinal stenosis in cervical region 12/13/2014  . Radiculopathy, lumbar region 12/13/2014  . Depression 05/03/2008  . PERONEAL NEUROPATHY 05/03/2008  . Malignant neoplasm of  kidney excluding renal pelvis ([facility]) 08/06/2007  . Controlled type 2 diabetes mellitus without complication, without long-term current use of insulin ([facility]) 08/06/2007  . GERD 08/06/2007  . PULMONARY EMBOLISM, HX OF 08/06/2007  . Hyperlipidemia 09/03/2006  . Essential hypertension 09/03/2006  . IBS 09/03/2006    Past Medical History:  Diagnosis Date  . Asthma   . Chronic obstructive pulmonary disease ([facility])   . Diverticulosis   . Diverticulosis of colon (without mention of hemorrhage)   . Esophageal reflux   . History of colonic polyps 06/05/05   hyperplastic  . Internal hemorrhoids   . Irritable bowel  syndrome   . Ischemic colitis ([facility])   . Malignant neoplasm of kidney and other and unspecified urinary organs    renal carcinoma, left nephrectomy in 2008-per patient.  . Other and unspecified hyperlipidemia   . Personal history of venous thrombosis and embolism    PTE after nephrectomy  . Spinal stenosis   . Type II or unspecified type diabetes mellitus without mention of complication, not stated as uncontrolled 02/05/2012   pt states it was in 2007  . Unspecified essential hypertension     Past Surgical History:  Procedure Laterality Date  . BREAST BIOPSY    . COLONOSCOPY W/ POLYPECTOMY  2000  . COLONOSCOPY W/ POLYPECTOMY  07/2004   Hyperplastic polyps  . DILATION AND CURETTAGE OF UTERUS  03/2005   Uterine Polyps  . NEPHRECTOMY  2007   right,Dr Dahlstedt  . SEPTOPLASTY    . TONSILLECTOMY      Social History   Tobacco Use  . Smoking status: Never Smoker  . Smokeless tobacco: Never Used  Substance Use Topics  . Alcohol use: No  . Drug use: No    Family History  Problem Relation Age of Onset  . Stroke Mother        onset in 32s  . Diabetes Mother   . Cancer Mother        bladder; nephrectomy for calculi/also uterine , TAH & BSO  . Aneurysm Mother        thoracic  . Depression Mother   . Stroke Brother   . Heart failure Brother   . Heart attack Other        maternal family history  . Heart failure Maternal Grandfather   . Lung cancer Father        smoked  . Heart failure Sister   . Depression Sister   . Alzheimer's disease Sister   . COPD Sister   . Aneurysm Brother        cns  . Depression Maternal Aunt        X2  . Bipolar disorder Sister   . Alcoholism Neg Hx     Allergies  Allergen Reactions  . Lisinopril     Cough D/Ced by Dr Melvyn Novas  . Codeine     nausea  . Verapamil     Pain in feet    Medication list has been reviewed and updated.  Current Outpatient Medications on File Prior to Visit  Medication Sig Dispense Refill  . Ascorbic Acid  (VITAMIN C) POWD Take 1 packet by mouth 2 (two) times daily as needed (cold symptoms).    Marland Kitchen aspirin EC 81 MG tablet Take 162 mg by mouth daily.    . Benzalkonium Chloride (DIABETIC BASICS HEALTHY FOOT EX) Apply 1 application topically daily as needed (foot pain). OTC diabetic foot cream    . Blood Glucose Monitoring Suppl (TRUERESULT BLOOD  GLUCOSE) W/DEVICE KIT Use to test blood sugar ICD 10 E11 9 1 each 0  . busPIRone (BUSPAR) 7.5 MG tablet TAKE 1 TABLET BY MOUTH(7.5 MG TOTAL) TWICE A DAY MAY INCREASE TO 2 TABLETS TWICE A DAY IF NEEDED 120 tablet 6  . CALCIUM PO Take 1 tablet by mouth daily.    . Cholecalciferol (VITAMIN D PO) Take 1 tablet by mouth daily. 2000 units    . CRANBERRY PO Take 2 capsules by mouth 2 (two) times daily. 36m    . ESTRACE VAGINAL 0.1 MG/GM vaginal cream Apply locally every other night at bedtime  1  . fluticasone (FLONASE) 50 MCG/ACT nasal spray INSTILL 2 SPRAYS INTO EACH NOSTRIL ONCE DAILY 48 g 3  . gabapentin (NEURONTIN) 100 MG capsule Take one capsule by mouth daily for 3 days, then increase to twice a day for 3 days, then 1 three times a day for maintenance dose 90 capsule 6  . glucose blood (TRUETEST TEST) test strip Use to test blood sugar ICD 10 E11 9 100 each 3  . Multiple Vitamins-Minerals (EYE VITAMINS PO) Take by mouth.    . triamcinolone (KENALOG) 0.025 % cream APPLY 1 APPLICATION TOPICALLY TWICE DAILY AS NEEDED FOR DRY SKIN ON BACK 80 g 1  . TRUEPLUS LANCETS 28G MISC Use to test blood sugar ICD 10 E11 9 100 each 3  . valsartan (DIOVAN) 160 MG tablet Take 1 by mouth once daily for blood pressure 90 tablet 3  . buPROPion (WELLBUTRIN) 100 MG tablet TAKE 1 TABLET(100 MG) BY MOUTH TWICE DAILY (Patient not taking: Reported on 03/02/2020) 60 tablet 5  . [DISCONTINUED] triamcinolone (KENALOG) 0.025 % cream APPLY 1 APPLICATION TOPICALLY TWICE DAILY AS NEEDED FOR DRY SKIN ON BACK 80 g 1   No current facility-administered medications on file prior to visit.    Review  of Systems:  As per HPI- otherwise negative.   Physical Examination: Vitals:   03/02/20 1346  BP: 136/80  Pulse: (!) 58  Resp: 18  Temp: 98.1 F (36.7 C)  SpO2: 97%   Vitals:   There is no height or weight on file to calculate BMI. Ideal Body Weight:    GEN: no acute distress. Elderly woman sitting in wheelchair. Accompanied by her husband today HEENT: Atraumatic, Normocephalic.  Ears and Nose: No external deformity. CV: RRR, No M/G/R. No JVD. No thrill. No extra heart sounds. PULM: CTA B, no wheezes, crackles, rhonchi. No retractions. No resp. distress. No accessory muscle use. EXTR: No c/c/e PSYCH: Normally interactive. Conversant. Normal quad, hamstring, plantar flexion strength of left leg. She does have slightly decreased dorsiflexion strength of her left foot Slow and hesitant gait Foot with normal pulses and sensation The skin on her face, upper chest and upper back appears tight, slightly erythematous, irritated. I suspect she is using too many topical products and steroids There are some mild swelling around the eyes No angioedema  Assessment and Plan: Itching - Plan: hydrOXYzine (ATARAX/VISTARIL) 10 MG tablet  Controlled type 2 diabetes mellitus without complication, without long-term current use of insulin (H[facility])  Essential hypertension  Solitary kidney  Bereavement - Plan: busPIRone (BUSPAR) 7.5 MG tablet  Left foot drop  Patient today with concern of itching, she endorses generalized itching but it seems most pronounced on her face, upper back and chest-sun exposed areas Normal liver function test within the last 4 months I do think some of her itching may be due to anxiety and stress. She also has been using  quite a few topical products including steroids and peroxide. I asked her to keep all products off her face except for Cetaphil, and to wash minimally with a mild soap and pat dry I prescribed hydroxyzine for her to use as needed for itching. Also  refilled her BuSpar. She is taking 7.5 mg 4 times a day which is fine Will prescribe an AFO brace for her to try for foot drop I asked her to schedule a short-term appointment within the next couple of weeks so I can check on her-appointment scheduled for 2 weeks This visit occurred during the SARS-CoV-2 public health emergency.  Safety protocols were in place, including screening questions prior to the visit, additional usage of staff PPE, and extensive cleaning of exam room while observing appropriate contact time as indicated for disinfecting solutions.    Signed Lamar Blinks, MD

## 2020-03-01 NOTE — Patient Instructions (Addendum)
It was good to see you again today, but I am sorry you are having such a hard time  Your skin on your face and neck/ upper back appears very irritated.  Please STOP putting anything on your skin except for a gentle moisturizer such as Cetaphil.  Wash your face with a gentle soap and pat dry- avoid excessive washing, do not apply any peroxide or oils.    I refilled your bentyl for nerves I also prescribed hydroxyzine to use as needed for itching- do not drive after taking this medication as it can make you drowsy  Please consider having a tetanus booster, and the new shingles series (called Shingrix) at your pharmacy  I think that you need more help- please consider moving into an assisted living community or at least decrease your babysitting duties.

## 2020-03-02 ENCOUNTER — Encounter: Payer: Self-pay | Admitting: Family Medicine

## 2020-03-02 ENCOUNTER — Other Ambulatory Visit: Payer: Self-pay

## 2020-03-02 ENCOUNTER — Ambulatory Visit (INDEPENDENT_AMBULATORY_CARE_PROVIDER_SITE_OTHER): Payer: Medicare HMO | Admitting: Family Medicine

## 2020-03-02 VITALS — BP 136/80 | HR 58 | Temp 98.1°F | Resp 18

## 2020-03-02 DIAGNOSIS — L299 Pruritus, unspecified: Secondary | ICD-10-CM | POA: Diagnosis not present

## 2020-03-02 DIAGNOSIS — Q600 Renal agenesis, unilateral: Secondary | ICD-10-CM

## 2020-03-02 DIAGNOSIS — M21372 Foot drop, left foot: Secondary | ICD-10-CM

## 2020-03-02 DIAGNOSIS — E119 Type 2 diabetes mellitus without complications: Secondary | ICD-10-CM

## 2020-03-02 DIAGNOSIS — Z634 Disappearance and death of family member: Secondary | ICD-10-CM

## 2020-03-02 DIAGNOSIS — I10 Essential (primary) hypertension: Secondary | ICD-10-CM

## 2020-03-02 DIAGNOSIS — IMO0002 Reserved for concepts with insufficient information to code with codable children: Secondary | ICD-10-CM

## 2020-03-02 MED ORDER — HYDROXYZINE HCL 10 MG PO TABS: 10.0000 mg | ORAL_TABLET | Freq: Three times a day (TID) | ORAL | 3 refills | Status: DC | PRN

## 2020-03-02 MED ORDER — BUSPIRONE HCL 7.5 MG PO TABS: 15.0000 mg | ORAL_TABLET | Freq: Two times a day (BID) | ORAL | 3 refills | Status: DC

## 2020-03-03 ENCOUNTER — Other Ambulatory Visit: Payer: Self-pay

## 2020-03-03 ENCOUNTER — Telehealth: Payer: Self-pay

## 2020-03-03 NOTE — Telephone Encounter (Signed)
-----   Message from Darreld Mclean, MD sent at 03/02/2020  6:29 PM EDT ----- Hi Kamarie Veno-I forwarded you a letter/AFO brace order for [facility]. Can you please print it out and mail to her, she doesn't check my chart  Thank you

## 2020-03-03 NOTE — Telephone Encounter (Signed)
Letter has been mailed to patient

## 2020-03-07 ENCOUNTER — Telehealth: Payer: Self-pay | Admitting: Family Medicine

## 2020-03-07 DIAGNOSIS — L299 Pruritus, unspecified: Secondary | ICD-10-CM

## 2020-03-07 MED ORDER — PREDNISONE 20 MG PO TABS: ORAL_TABLET | ORAL | 0 refills | Status: DC

## 2020-03-07 NOTE — Telephone Encounter (Signed)
CallerMarcine [name]  Call Back # [phone]    hydrOXYzine (ATARAX/VISTARIL) 10 MG tablet [[id number]  Patient states that she is itching and burning. Patient states that she needs a steroid to get rid of the rash. Patient states that she is having trouble standing.    Please Advise

## 2020-03-07 NOTE — Telephone Encounter (Signed)
Called pt and spoke with her.  She notes bad itching and "breaking out" on her face and neck She is itching a lot, this is really bothering her Her derm is Dr Ubaldo Glassing- she notes that the last time she was seen they were not able to help her much.  I asked her to try them again in the am- if they are not able to see her within a week or so let me know I will try to figure something else out for her  Called in prednisone again  Meds ordered this encounter  Medications  . predniSONE (DELTASONE) 20 MG tablet    Sig: Take 20 mg daily for 4 days, then 20 mg daily for 6 days    Dispense:  14 tablet    Refill:  0

## 2020-03-07 NOTE — Telephone Encounter (Signed)
Received call from triage nurse. She states patient called itching, the rash is no better. Patient also states she is unable to stand or walk without holding on to something. I explained to her we do not have anything today or tomorrow however I will talk with PCP. Also advised patient is she is truly not feeling well enough to function as she said she probably should be checked out at an urgent care or ER for immediate relief.

## 2020-03-08 ENCOUNTER — Other Ambulatory Visit: Payer: Self-pay

## 2020-03-08 ENCOUNTER — Telehealth: Payer: Self-pay | Admitting: Family Medicine

## 2020-03-08 DIAGNOSIS — L299 Pruritus, unspecified: Secondary | ICD-10-CM

## 2020-03-08 MED ORDER — PREDNISONE 20 MG PO TABS: ORAL_TABLET | ORAL | 0 refills | Status: DC

## 2020-03-08 NOTE — Telephone Encounter (Signed)
Pt spoke with PCP 03/07/2020.   [facility] Day - Client TELEPHONE ADVICE RECORD AccessNurse Patient Name: Alexa [name] Gender: Female DOB: [birth year] Age: 86 Y 74 M 18 D Return Phone Number: [phone] (Primary) Address: City/State/Zip: [place] [address] Client [facility] - Client Client Site [facility] - Day Physician Copland, Janett Billow- MD Contact Type Call Who Is Calling Patient / Member / Family / Caregiver Call Type Triage / Clinical Relationship To Patient Self Return Phone Number [phone] (Primary) Chief Complaint WEAKNESS - sudden on one side of face or body Reason for Call Symptomatic / Request for [place] states she is having difficulty standing. Additional Comment She also states she is itchy on her upper body. Translation No Nurse Assessment Nurse: Laurena Bering, RN, Helene Kelp Date/Time Eilene Ghazi Time): 03/07/2020 3:32:53 PM Confirm and document reason for call. If symptomatic, describe symptoms. ---Caller states that she has difficulty standing. Seems confused. Has rash on chest and arms. Has the patient had close contact with a person known or suspected to have the novel coronavirus illness OR traveled / lives in area with major community spread (including international travel) in the last 14 days from the onset of symptoms? * If Asymptomatic, screen for exposure and travel within the last 14 days. ---No Does the patient have any new or worsening symptoms? ---Yes Will a triage be completed? ---Yes Related visit to physician within the last 2 weeks? ---No Does the PT have any chronic conditions? (i.e. diabetes, asthma, this includes High risk factors for pregnancy, etc.) ---Yes List chronic conditions. ---HTN, anxiety Is this a behavioral health or substance abuse call? ---No Guidelines Guideline Title Affirmed Question Affirmed Notes Nurse Date/Time  (Eastern Time) Weakness (Generalized) and Fatigue [1] MODERATE weakness (i.e., interferes with work, school, normal activities) AND [2] cause unknown (Exceptions: weakness with acute minor illness, Milton Ferguson 03/07/2020 3:34:26 PM PLEASE NOTE: All timestamps contained within this report are represented as [place] Standard Time. CONFIDENTIALTY NOTICE: This fax transmission is intended only for the addressee. It contains information that is legally privileged, confidential or otherwise protected from use or disclosure. If you are not the intended recipient, you are strictly prohibited from reviewing, disclosing, copying using or disseminating any of this information or taking any action in reliance on or regarding this information. If you have received this fax in error, please notify us immediately by telephone so that we can arrange for its return to [place]. Phone: [phone], Toll-Free: [phone], Fax: [phone] Page: 2 of 2 Call Id: [id number] Guidelines Guideline Title Affirmed Question Affirmed Notes Nurse Date/Time Eilene Ghazi Time) or weakness from poor fluid intake) Disp. Time Eilene Ghazi Time) Disposition Final User 03/07/2020 3:29:15 PM Send to Urgent Marina Goodell, [place] 03/07/2020 3:35:57 PM See HCP within 4 Hours (or PCP triage) Yes Laurena Bering, RN, [place] Artist Disagree/Comply Comply Caller Understands Yes PreDisposition Call Doctor Care Advice Given Per Guideline SEE HCP WITHIN 4 HOURS (OR PCP TRIAGE): BRING MEDICINES: * Please bring a list of your current medicines when you go to see the doctor. * You become worse. CALL BACK IF: CARE ADVICE given per Weakness and Fatigue (Adult) guideline. Comments User: Dorothyann Peng, RN Date/Time Eilene Ghazi Time): 03/07/2020 3:42:08 PM Advised caller not to drive. called office and spoke with Dr. Lillie Fragmin assistance. Caller had contacted office 15 minutes and message has been sent to MD. Office will contact caller. No available  appointments today Referrals Warm transfer to backline

## 2020-03-08 NOTE — Telephone Encounter (Signed)
New script sent to pharmacy

## 2020-03-08 NOTE — Telephone Encounter (Signed)
Caller: [place] Call back # [phone]  Patient states pharmacy [facility] [address] need direction on Prednisone   Please advise

## 2020-03-15 ENCOUNTER — Other Ambulatory Visit: Payer: Self-pay

## 2020-03-15 ENCOUNTER — Encounter: Payer: Self-pay | Admitting: Family Medicine

## 2020-03-15 ENCOUNTER — Ambulatory Visit (INDEPENDENT_AMBULATORY_CARE_PROVIDER_SITE_OTHER): Payer: Medicare HMO | Admitting: Family Medicine

## 2020-03-15 DIAGNOSIS — M4802 Spinal stenosis, cervical region: Secondary | ICD-10-CM | POA: Diagnosis not present

## 2020-03-15 DIAGNOSIS — L299 Pruritus, unspecified: Secondary | ICD-10-CM | POA: Diagnosis not present

## 2020-03-15 MED ORDER — GABAPENTIN 100 MG PO CAPS: 100.0000 mg | ORAL_CAPSULE | Freq: Three times a day (TID) | ORAL | 3 refills | Status: DC

## 2020-03-15 MED ORDER — PREDNISONE 20 MG PO TABS: ORAL_TABLET | ORAL | 0 refills | Status: DC

## 2020-03-15 NOTE — Patient Instructions (Signed)
It was good to see you again today- your skin looks great!    Use hydroxyzine for the next few days in hopes of keeping itching from returning I gave you a paper rx for prednisone to use IF NEEDED; if the itching comes back you can start back on this and please let me know   Please see me in about 3 months

## 2020-03-15 NOTE — Progress Notes (Signed)
[facility] [place], [place], [place] [address] [phone]  Date:  03/15/2020   Name:  Alexa [name]   DOB:  [birth year]   MRN:  [id number]  PCP:  Darreld Mclean, MD    Chief Complaint: Rash/Itching (2 week follow up)   History of Present Illness:  Alexa [name] is a 86 y.o. very pleasant female patient who presents with the following:  Patient here today for short-term follow-up I saw her most recently on August 5 to follow-up on itching, also mild foot drop on the left side at that time Elderly lady with history of controlled diabetes, hypertension, hyperlipidemia, spinal stenosis, solitary kidney status post nephrectomy for renal cancer Dr. Nelva Bush has been treating her back pain with hydrocodone  At her last visit I treated her with hydroxyzine to use as needed for itching. Asked her her DC all topical products to her face except for eucerin as needed  Patient called back later in that week with concern of uncontrolled itching, I sent in another course of prednisone for her- she just finished the prednisone today  Right now her itching is actually better.  She feels like her eyes are puffy but otherwise skin looks and feels much more normal.  She is pleased with her results No fever or other acute symptoms Accompanied today by her husband  Eye exam- pt notes she was seen about one month ago per DR Katy Fitch   Lab Results  Component Value Date   HGBA1C 6.8 (H) 11/29/2019     Patient Active Problem List   Diagnosis Date Noted  . Solitary kidney 12/15/2018  . Cough variant asthma 12/29/2014  . Spinal stenosis in cervical region 12/13/2014  . Radiculopathy, lumbar region 12/13/2014  . Depression 05/03/2008  . PERONEAL NEUROPATHY 05/03/2008  . Malignant neoplasm of kidney excluding renal pelvis ([place]) 08/06/2007  . Controlled type 2 diabetes mellitus without complication, without long-term current use of  insulin ([place]) 08/06/2007  . GERD 08/06/2007  . PULMONARY EMBOLISM, HX OF 08/06/2007  . Hyperlipidemia 09/03/2006  . Essential hypertension 09/03/2006  . IBS 09/03/2006    Past Medical History:  Diagnosis Date  . Asthma   . Chronic obstructive pulmonary disease ([place])   . Diverticulosis   . Diverticulosis of colon (without mention of hemorrhage)   . Esophageal reflux   . History of colonic polyps 06/05/05   hyperplastic  . Internal hemorrhoids   . Irritable bowel syndrome   . Ischemic colitis ([place])   . Malignant neoplasm of kidney and other and unspecified urinary organs    renal carcinoma, left nephrectomy in 2008-per patient.  . Other and unspecified hyperlipidemia   . Personal history of venous thrombosis and embolism    PTE after nephrectomy  . Spinal stenosis   . Type II or unspecified type diabetes mellitus without mention of complication, not stated as uncontrolled 02/05/2012   pt states it was in 2007  . Unspecified essential hypertension     Past Surgical History:  Procedure Laterality Date  . BREAST BIOPSY    . COLONOSCOPY W/ POLYPECTOMY  2000  . COLONOSCOPY W/ POLYPECTOMY  07/2004   Hyperplastic polyps  . DILATION AND CURETTAGE OF UTERUS  03/2005   Uterine Polyps  . NEPHRECTOMY  2007   right,Dr Dahlstedt  . SEPTOPLASTY    . TONSILLECTOMY      Social History   Tobacco Use  . Smoking status:  Never Smoker  . Smokeless tobacco: Never Used  Substance Use Topics  . Alcohol use: No  . Drug use: No    Family History  Problem Relation Age of Onset  . Stroke Mother        onset in 14s  . Diabetes Mother   . Cancer Mother        bladder; nephrectomy for calculi/also uterine , TAH & BSO  . Aneurysm Mother        thoracic  . Depression Mother   . Stroke Brother   . Heart failure Brother   . Heart attack Other        maternal family history  . Heart failure Maternal Grandfather   . Lung cancer Father        smoked  . Heart failure Sister   .  Depression Sister   . Alzheimer's disease Sister   . COPD Sister   . Aneurysm Brother        cns  . Depression Maternal Aunt        X2  . Bipolar disorder Sister   . Alcoholism Neg Hx     Allergies  Allergen Reactions  . Lisinopril     Cough D/Ced by Dr Melvyn Novas  . Codeine     nausea  . Verapamil     Pain in feet    Medication list has been reviewed and updated.  Current Outpatient Medications on File Prior to Visit  Medication Sig Dispense Refill  . Ascorbic Acid (VITAMIN C) POWD Take 1 packet by mouth 2 (two) times daily as needed (cold symptoms).    Marland Kitchen aspirin EC 81 MG tablet Take 162 mg by mouth daily.    . Benzalkonium Chloride (DIABETIC BASICS HEALTHY FOOT EX) Apply 1 application topically daily as needed (foot pain). OTC diabetic foot cream    . Blood Glucose Monitoring Suppl (TRUERESULT BLOOD GLUCOSE) W/DEVICE KIT Use to test blood sugar ICD 10 E11 9 1 each 0  . buPROPion (WELLBUTRIN) 100 MG tablet TAKE 1 TABLET(100 MG) BY MOUTH TWICE DAILY 60 tablet 5  . busPIRone (BUSPAR) 7.5 MG tablet Take 2 tablets (15 mg total) by mouth 2 (two) times daily. May take one 4x a day if preferred 360 tablet 3  . CALCIUM PO Take 1 tablet by mouth daily.    . Cholecalciferol (VITAMIN D PO) Take 1 tablet by mouth daily. 2000 units    . CRANBERRY PO Take 2 capsules by mouth 2 (two) times daily. $RemoveBefo'42mg'JlbDEsSRXac$     . ESTRACE VAGINAL 0.1 MG/GM vaginal cream Apply locally every other night at bedtime  1  . fluticasone (FLONASE) 50 MCG/ACT nasal spray INSTILL 2 SPRAYS INTO EACH NOSTRIL ONCE DAILY 48 g 3  . glucose blood (TRUETEST TEST) test strip Use to test blood sugar ICD 10 E11 9 100 each 3  . hydrOXYzine (ATARAX/VISTARIL) 10 MG tablet Take 1-2 tablets (10-20 mg total) by mouth 3 (three) times daily as needed. As needed for itching 60 tablet 3  . Multiple Vitamins-Minerals (EYE VITAMINS PO) Take by mouth.    . triamcinolone (KENALOG) 0.025 % cream APPLY 1 APPLICATION TOPICALLY TWICE DAILY AS NEEDED FOR DRY  SKIN ON BACK 80 g 1  . TRUEPLUS LANCETS 28G MISC Use to test blood sugar ICD 10 E11 9 100 each 3  . valsartan (DIOVAN) 160 MG tablet Take 1 by mouth once daily for blood pressure 90 tablet 3  . [DISCONTINUED] triamcinolone (KENALOG) 0.025 % cream APPLY 1 APPLICATION  TOPICALLY TWICE DAILY AS NEEDED FOR DRY SKIN ON BACK 80 g 1   No current facility-administered medications on file prior to visit.    Review of Systems:  As per HPI- otherwise negative.   Physical Examination: Vitals:   03/15/20 1433  BP: 124/88  Pulse: 89  Resp: 18  SpO2: 98%   Vitals:   03/15/20 1433  Weight: 124 lb 12.8 oz (56.6 kg)  Height: _0  (1.6 m)   Body mass index is 22.11 kg/m. Ideal Body Weight: Weight in (lb) to have BMI = 25: 140.8  GEN: no acute distress.  Elderly woman who appears her normal self HEENT: Atraumatic, Normocephalic.  Skin on her upper chest and neck is still slightly tight and shiny in appearance, but much better.  The skin on her upper back is normal and smooth I do not appreciate any significant swelling around the eyes, she does have puffiness of the under area Oropharynx within normal limits Ears and Nose: No external deformity. CV: RRR, No M/G/R. No JVD. No thrill. No extra heart sounds. PULM: CTA B, no wheezes, crackles, rhonchi. No retractions. No resp. distress. No accessory muscle use EXTR: No c/c/e PSYCH: Normally interactive. Conversant.    Assessment and Plan: Spinal stenosis in cervical region - Plan: gabapentin (NEURONTIN) 100 MG capsule  Itching - Plan: predniSONE (DELTASONE) 20 MG tablet  Patient here today to follow-up on itching Refill gabapentin that she uses for spinal stenosis symptoms Patient itching is currently much better.  She is just finishing up a course of prednisone I encouraged her to use her hydroxyzine as needed for the next several days to keep itching from returning If itching does return, she may go back on a longer steroid taper.  I  provided paper prescription for today.  I asked her to please let me know if she does end up having to go back on steroids  Asked her to see me in 3 months for follow-up This visit occurred during the SARS-CoV-2 public health emergency.  Safety protocols were in place, including screening questions prior to the visit, additional usage of staff PPE, and extensive cleaning of exam room while observing appropriate contact time as indicated for disinfecting solutions.    Signed Lamar Blinks, MD

## 2020-03-21 ENCOUNTER — Telehealth: Payer: Self-pay

## 2020-03-21 DIAGNOSIS — L299 Pruritus, unspecified: Secondary | ICD-10-CM

## 2020-03-21 NOTE — Telephone Encounter (Signed)
Pt called stating she can't stand up for the past 3-4 days, legs feel weak.  Pt thinks it may be due to medication changes.  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  She has to hold on to something when she walks.  Pt is itching some in the evenings. She said the Prednisone helped previously.  She has lost that script that was given to her at the last visit.  Please call into [facility] on [address].

## 2020-03-22 MED ORDER — PREDNISONE 20 MG PO TABS: ORAL_TABLET | ORAL | 0 refills | Status: DC

## 2020-03-22 NOTE — Telephone Encounter (Signed)
Sent in new rx for prednisone which she lost.  Called pt to check on her no answer, VM full so cannot LM

## 2020-03-22 NOTE — Telephone Encounter (Signed)
Looks like prednisone and gabapentin was sent in at last visit.

## 2020-03-22 NOTE — Telephone Encounter (Signed)
Pt called back and I was able to speak with her.  She feels like she is doing ok, stable  She will let me know if getting worse or needs to be seen

## 2020-03-22 NOTE — Telephone Encounter (Signed)
New message:   Pt is calling and states she has lost the prescription that was given to her and she needs it sent into [facility] on [address]. Please advise.

## 2020-03-28 ENCOUNTER — Telehealth: Payer: Self-pay | Admitting: Family Medicine

## 2020-03-28 DIAGNOSIS — M19072 Primary osteoarthritis, left ankle and foot: Secondary | ICD-10-CM | POA: Diagnosis not present

## 2020-03-28 DIAGNOSIS — G5792 Unspecified mononeuropathy of left lower limb: Secondary | ICD-10-CM | POA: Diagnosis not present

## 2020-03-28 NOTE — Telephone Encounter (Signed)
error 

## 2020-03-28 NOTE — Telephone Encounter (Signed)
Patient called and is requesting a call back regarding her appointment she had today for her foot and ankle, please advise. CB is [phone]

## 2020-03-29 DIAGNOSIS — E559 Vitamin D deficiency, unspecified: Secondary | ICD-10-CM | POA: Diagnosis not present

## 2020-03-29 DIAGNOSIS — G603 Idiopathic progressive neuropathy: Secondary | ICD-10-CM | POA: Diagnosis not present

## 2020-03-29 DIAGNOSIS — G5732 Lesion of lateral popliteal nerve, left lower limb: Secondary | ICD-10-CM | POA: Diagnosis not present

## 2020-03-29 DIAGNOSIS — M5417 Radiculopathy, lumbosacral region: Secondary | ICD-10-CM | POA: Diagnosis not present

## 2020-03-29 DIAGNOSIS — G5601 Carpal tunnel syndrome, right upper limb: Secondary | ICD-10-CM | POA: Diagnosis not present

## 2020-03-29 DIAGNOSIS — R5383 Other fatigue: Secondary | ICD-10-CM | POA: Diagnosis not present

## 2020-03-29 DIAGNOSIS — R202 Paresthesia of skin: Secondary | ICD-10-CM | POA: Diagnosis not present

## 2020-03-29 DIAGNOSIS — R634 Abnormal weight loss: Secondary | ICD-10-CM | POA: Diagnosis not present

## 2020-03-29 DIAGNOSIS — G609 Hereditary and idiopathic neuropathy, unspecified: Secondary | ICD-10-CM | POA: Diagnosis not present

## 2020-03-29 DIAGNOSIS — M21372 Foot drop, left foot: Secondary | ICD-10-CM | POA: Diagnosis not present

## 2020-03-29 DIAGNOSIS — M5412 Radiculopathy, cervical region: Secondary | ICD-10-CM | POA: Diagnosis not present

## 2020-03-29 LAB — TSH: TSH: 0.33 — AB (ref 0.41–5.90)

## 2020-03-29 LAB — VITAMIN B12: Vitamin B-12: 681

## 2020-03-29 NOTE — Telephone Encounter (Signed)
Patient states message was sent in error. No guidance needed at this time.

## 2020-03-30 LAB — VITAMIN D, 25-OH,TOTAL,IA(REFL): Lab: 29

## 2020-03-30 LAB — VITAMIN B6: Lab: 17.8

## 2020-03-30 LAB — FOLATE, RBC AND SERUM: Lab: 11.5

## 2020-03-30 LAB — ANA: Lab: NEGATIVE

## 2020-04-02 ENCOUNTER — Other Ambulatory Visit: Payer: Self-pay | Admitting: Family Medicine

## 2020-04-02 DIAGNOSIS — I10 Essential (primary) hypertension: Secondary | ICD-10-CM

## 2020-04-04 ENCOUNTER — Telehealth: Payer: Self-pay | Admitting: Family Medicine

## 2020-04-04 DIAGNOSIS — L299 Pruritus, unspecified: Secondary | ICD-10-CM

## 2020-04-04 MED ORDER — PREDNISONE 20 MG PO TABS: ORAL_TABLET | ORAL | 0 refills | Status: DC

## 2020-04-04 NOTE — Telephone Encounter (Signed)
Called her to get more info- no answer, VM full so cannot [facility]

## 2020-04-04 NOTE — Telephone Encounter (Signed)
Medication: predniSONE (DELTASONE) 20 MG tablet    Has the patient contacted their pharmacy? No. (If no, request that the patient contact the pharmacy for the refill.) (If yes, when and what did the pharmacy advise?)  Preferred Pharmacy (with phone number or street name): [id number] [address], [place] [address]  [address] [place] [address]  Phone:  [phone] Fax:  [phone]  DEA #:  [id number]  Agent: Please be advised that RX refills may take up to 3 business days. We ask that you follow-up with your pharmacy.

## 2020-04-04 NOTE — Telephone Encounter (Signed)
Called pt- she states she is doing better as far as itching, should finish up her most recent course of pred tomorrow.  I explained that taking oral steroids long term can have a lot of adverse effects which is why we try not to do this.    I agreed to send in a refill, but asked her not to take it unless itching does return once she is off steroids.

## 2020-04-07 ENCOUNTER — Ambulatory Visit: Payer: Medicare HMO | Admitting: Pharmacist

## 2020-04-07 DIAGNOSIS — E785 Hyperlipidemia, unspecified: Secondary | ICD-10-CM

## 2020-04-07 DIAGNOSIS — E119 Type 2 diabetes mellitus without complications: Secondary | ICD-10-CM

## 2020-04-07 DIAGNOSIS — I10 Essential (primary) hypertension: Secondary | ICD-10-CM

## 2020-04-07 NOTE — Chronic Care Management (AMB) (Signed)
Chronic Care Management Pharmacy  Name: Alexa [name]  MRN: [id number] DOB: 05-08-1930  Chief Complaint/ HPI  Alexa [name],  86 y.o. , female presents for their Follow-Up CCM visit with the clinical pharmacist via telephone due to COVID-19 Pandemic.  PCP : Darreld Mclean, MD  Their chronic conditions include: Diabetes, Hypertension, Hyperlipidemia, Asthma, Neuropathy/Pain, Depression, Allergic Rhinitis, Estrogen Deficiency, Rash  Office Visits: 04/04/20: Pt request for prednisone refill. Dr. Lorelei Pont advised of risk of long term steroid use. Provided prednisone refill, but only if necessary.  03/15/20: Visit w/ Dr. Lorelei Pont - Rash improved. Provided prednisone script if itching returns.  03/02/20: Visit w/ Dr. Lorelei Pont - itching prescribed hydroxyzine 60m with request to avoid all topicals on face except for eucerin/cetaphil, bereavement prescribed buspirone 7.51m pt complaint of left foot drop.  Consult Visit: None since last CCM visit on 01/03/20.   ED Visit:  02/24/20: [facility] - Left after triage  [facility]16/[facility]: [facility]. Treated with steroids and pepcid. Switch to zyrtec. Avoid benadryl due to drowsiness and drying.  Medications: Outpatient Encounter Medications as of 04/07/2020  Medication Sig Note  . buPROPion (WELLBUTRIN) 100 MG tablet TAKE 1 TABLET(100 MG) BY MOUTH TWICE DAILY   . busPIRone (BUSPAR) 7.5 MG tablet Take 2 tablets (15 mg total) by mouth 2 (two) times daily. May take one 4x a Shayle Donahoo if preferred   . gabapentin (NEURONTIN) 100 MG capsule Take 1 capsule (100 mg total) by mouth 3 (three) times daily. Take one capsule by mouth three times daily.   . Marland Kitchenriamcinolone (KENALOG) 0.025 % cream APPLY 1 APPLICATION TOPICALLY TWICE DAILY AS NEEDED FOR DRY SKIN ON BACK   . valsartan (DIOVAN) 160 MG tablet Take 1 by mouth once daily for blood pressure   . Ascorbic Acid (VITAMIN C) POWD Take 1 packet by mouth 2 (two) times daily as needed  (cold symptoms).   . Marland Kitchenspirin EC 81 MG tablet Take 162 mg by mouth daily.   . Benzalkonium Chloride (DIABETIC BASICS HEALTHY FOOT EX) Apply 1 application topically daily as needed (foot pain). OTC diabetic foot cream   . Blood Glucose Monitoring Suppl (TRUERESULT BLOOD GLUCOSE) W/DEVICE KIT Use to test blood sugar ICD 10 E11 9   . CALCIUM PO Take 1 tablet by mouth daily. 01/03/2020: Unsure of dosage  . Cholecalciferol (VITAMIN D PO) Take 1 tablet by mouth daily. 2000 units   . CRANBERRY PO Take 2 capsules by mouth 2 (two) times daily. 4273m . ESTRACE VAGINAL 0.1 MG/GM vaginal cream Apply locally every other night at bedtime   . fluticasone (FLONASE) 50 MCG/ACT nasal spray INSTILL 2 SPRAYS INTO EACH NOSTRIL ONCE DAILY   . glucose blood (TRUETEST TEST) test strip Use to test blood sugar ICD 10 E11 9   . hydrOXYzine (ATARAX/VISTARIL) 10 MG tablet Take 1-2 tablets (10-20 mg total) by mouth 3 (three) times daily as needed. As needed for itching (Patient not taking: Reported on 04/07/2020)   . Multiple Vitamins-Minerals (EYE VITAMINS PO) Take by mouth.   . predniSONE (DELTASONE) 20 MG tablet Take 40 mg daily for 4 days, then 20 mg daily for 6 days, then 10 mg daily for 6 days   . TRUEPLUS LANCETS 28G MISC Use to test blood sugar ICD 10 E11 9   . [DISCONTINUED] triamcinolone (KENALOG) 0.025 % cream APPLY 1 APPLICATION TOPICALLY TWICE DAILY AS NEEDED FOR DRY SKIN ON BACK    No facility-administered encounter medications on  file as of 04/07/2020.   SDOH Screenings   Alcohol Screen:   . Last Alcohol Screening Score (AUDIT): Not on file  Depression (PHQ2-9): Low Risk   . PHQ-2 Score: 1  Financial Resource Strain: Low Risk   . Difficulty of Paying Living Expenses: Not hard at all  Food Insecurity: No Food Insecurity  . Worried About Charity fundraiser in the Last Year: Never true  . Ran Out of Food in the Last Year: Never true  Housing: Low Risk   . Last Housing Risk Score: 0  Physical Activity:   .  Days of Exercise per Week: Not on file  . Minutes of Exercise per Session: Not on file  Social Connections:   . Frequency of Communication with Friends and Family: Not on file  . Frequency of Social Gatherings with Friends and Family: Not on file  . Attends Religious Services: Not on file  . Active Member of Clubs or Organizations: Not on file  . Attends Archivist Meetings: Not on file  . Marital Status: Not on file  Stress:   . Feeling of Stress : Not on file  Tobacco Use: Low Risk   . Smoking Tobacco Use: Never Smoker  . Smokeless Tobacco Use: Never Used  Transportation Needs: No Transportation Needs  . Lack of Transportation (Medical): No  . Lack of Transportation (Non-Medical): No     Current Diagnosis/Assessment:  Goals Addressed            This Visit's Progress   . Chronic Care Management Pharmacy Care Plan       CARE PLAN ENTRY  Current Barriers:  . Chronic Disease Management support, education, and care coordination needs related to Diabetes, Hypertension, Hyperlipidemia, Asthma, Neuropathy/Pain, Depression, Allergic Rhinitis, Estrogen Deficiency, Rash   Hypertension . Pharmacist Clinical Goal(s): o Over the next 90 days, patient will work with PharmD and providers to achieve BP goal <140/90 . Current regimen:  o Valsartan 157m daily . Interventions: o Request patient to check BP 1-2 times per week and record . Patient self care activities - Over the next 90 days, patient will: o Check BP 1-2 times per week, document, and provide at future appointments o Ensure daily salt intake < 2300 mg/Madelaine Whipple  Hyperlipidemia . Pharmacist Clinical Goal(s): o Over the next 90 days, patient will work with PharmD and providers to achieve LDL goal < 100 . Current regimen:  o Diet and exercise management   . Interventions: o Discussed importance of diet . Patient self care activities - Over the next 90 days, patient will: o Work on reducing cholesterol containing  foods  Diabetes . Pharmacist Clinical Goal(s): o Over the next 90 days, patient will work with PharmD and providers to maintain A1c goal <7% . Current regimen:  o Diet and exercise management   . Interventions: o Discussed importance of diet . Patient self care activities - Over the next 90 days, patient will: o Maintain a1c <7%  Health Maintenance . Pharmacist Clinical Goal(s) o Over the next 180 days, patient will work with PharmD and providers to complete health maintenance screenings . Interventions: o Recommend patient to complete DEXA Scan o Discussed importance of consuming 12076mof calcium daily through diet and/or supplementation . Patient self care activities - Over the next 180 days, patient will: o Complete DEXA Scan o Assure consumption of 12008mf calcium daily  Rash . Pharmacist Clinical Goal(s) o Over the next 90 days, patient will work with PharmD and  providers to reduce symptoms of rash . Current regimen:  o Triamcinolone 0.025% cream as needed . Interventions: o Collaboration with provider regarding disease management (derm/allergy referral?) . Patient self care activities - Over the next 90 days, patient will: o Maintain medication regimen for rash   Medication management . Pharmacist Clinical Goal(s): o Over the next 90 days, patient will work with PharmD and providers to maintain optimal medication adherence . Current pharmacy: [facility] . Interventions o Comprehensive medication review performed. o Continue current medication management strategy . Patient self care activities - Over the next 90 days, patient will: o Focus on medication adherence by filling and taking medications appropriately  o Take medications as prescribed o Report any questions or concerns to PharmD and/or provider(s)  Please see past updates related to this goal by clicking on the "Past Updates" button in the selected goal        Married 68 years.  Has 1 son living. Lost  a daughter and grand daughter.  Has 3 great grandchildren. She cares for them after school and during the summer She has 2 dogs (poodle and maltese) and a cat.   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  Update 04/07/20 Moving back into her home Monday 9/13 and Tuesday 04/11/20  Diabetes   A1c goal <7%  Recent Relevant Labs: Lab Results  Component Value Date/Time   HGBA1C 6.8 (H) 11/29/2019 11:39 AM   HGBA1C 7.3 (H) 12/16/2018 11:57 AM   MICROALBUR 0.9 02/25/2014 12:20 PM   MICROALBUR 0.9 12/07/2007 10:10 AM   Checking BG: Rarely  Patient has failed these meds in past: None noted  Patient is currently controlled on the following medications:   None noted   States she has lost weight Going to eye doctor on Friday  Last diabetic Eye exam: No results found for: HMDIABEYEEXA  Last diabetic Foot exam: No results found for: HMDIABFOOTEX   We discussed: Importance of diet   Update 04/07/20 Has taken several courses of prednisone. Watchful of affect on a1c.  She states is eating more pasta, cookies, and ice cream since she has lost so much weight.  She is fearful of losing control of her diabetes, but she wants to gain weight. States she was given prescription vitamin D about a week ago from a doctor in [place] (Dr. Boyd Kerbs). She was told her thyroid was low and would need to have it evaluated by PCP.  Plan -Continue control with diet and exercise  -Have TSH evaluated at next follow up with Dr. Lorelei Pont  Hypertension   Blood pressure goal <140/90   CMP Latest Ref Rng & Units 11/17/2019 08/25/2019 12/16/2018  Glucose 70 - 99 mg/dL 130(H) 156(H) 233(H)  BUN 8 - 23 mg/dL 22 23 19   Creatinine 0.44 - 1.00 mg/dL 0.97 0.94 0.98  Sodium 135 - 145 mmol/L 137 139 137  Potassium 3.5 - 5.1 mmol/L 4.3 4.2 4.5  Chloride 98 - 111 mmol/L 104 105 99  CO2 22 - 32 mmol/L 23 24 28   Calcium 8.9 - 10.3 mg/dL 9.0 9.2 9.2  Total Protein 6.5  - 8.1 g/dL 6.9 - 7.0  Total Bilirubin 0.3 - 1.2 mg/dL 0.9 - 0.7  Alkaline Phos 38 - 126 U/L 56 - 71  AST 15 - 41 U/L 20 - 15  ALT 0 - 44 U/L 17 - 15   Kidney Function Lab Results  Component Value Date/Time   CREATININE 0.97 11/17/2019 04:30 PM  CREATININE 0.94 08/25/2019 01:40 PM   CREATININE 1.24 (H) 04/03/2018 03:52 PM   CREATININE 1.08 05/07/2013 05:05 PM   GFR 53.67 (L) 12/16/2018 11:57 AM   GFRNONAA 53 (L) 11/17/2019 04:30 PM   GFRAA >60 11/17/2019 04:30 PM   BP today is:  168/107 pulse 97 (already taken pill) Denies chest pain, headache, or dizziness  Office blood pressures are  BP Readings from Last 3 Encounters:  03/15/20 124/88  03/02/20 136/80  02/24/20 (!) 191/98   Patient has failed these meds in the past: lisinopril (cough) Patient is currently controlled on the following medications:   Valsartan 183m daily (increased at 11/29/19 [place] visit)  Patient checks BP at home 1-2x per week  Patient home BP readings are ranging: 145-150s  She felt dizzy when she fell last week.  Gets a headache when her blood pressure is elevated.  She tries to keep moving. Works mostly in her garden  Cut salt out of her diet due to her mother having a history of stroke.  We discussed Importance of blood pressure control   Update 04/07/20 States her BP is "low"  Plan -Continue current medications  -Continue checking blood pressure 1-2 times per week and record   Neuropathy/Pain    Patient has failed these meds in past: tramadol (efficacy) Patient is currently controlled on the following medications:   Gabapentin 1043mup to three times daily (takes 1-2 daily)  Hydrocodone 5-32521mvery 6 hours as needed  Pain Dr. RamNelva Bush KnoBayard Malesrescribes hydrocodone for back pain States she has taken hydrocodone for about 2 years.  She wonders if the hydrocodone could be the cause of her rash.  She stopped taking hydrocodone, and the rash did improve, but she is still  itching.  She is now taking tylenol for arthritis every 6 hours.  Neuropathy Feels it is getting better  We discussed:  The possibility of hydrocodone causing her rash   Update 04/07/20 Being seen by Dr. AndBoyd Kerbse checked the nerves in her leg.  She really enjoys his care  Plan -Continue current medications   Depression    Patient has failed these meds in past: Duloxetine (intolerance) Patient is currently controlled on the following medications:   Busipirone 7.5mg83m tabs twice daily (prescribed #2 twice daily)  She states she stopped wellbutrin due to them moving it to a large red pill that she couldn't swallow. States she has not taken this in about a year.  "I'm doing alright"  Update 04/07/20 She states she has lost some weight since being out of her home.  She states is eating more pasta, cookies, and ice cream. PHQ9: 1 She feels buspirone makes her weak, so she only takes #1 tab BID  Plan -Continue current medications   Rash    Patient has failed these meds in past: None noted  Patient is currently stable on the following medications: Triamcinolone 0.025% cream as needed  Has had a rash for over a year now and doesn't know what the cause is.  States she went to a dermatologist and was told to continue the cream she is currently prescribed. She states she is unsure of the diagnosis.  Interested in a referral to a new dermatologist to determine the cause of her rash. She feels it is more than an eczema type rash. Does report rough patches on her skin. Bumps and sores on her neck. "It's tormenting me".  She wonders which medications could be causing her to itch  We discussed:  The possibility of hydrocodone or gabapentin being the cause of her rash and the possibility for a derm or allergy referral   Update 04/07/20 Started a new round of prednisone yesterday.  She states her rash is doing better now.  She is hoping she will be able to continue on  prednisone.  She is hoping when she moves into her home, her nerves will be better.  She states she took the hydroxyzine for a short period, but hasn't taken since she started new trial of prednisone.  Plan -Continue current medications   Medication Management   Pt uses [facility] for all medications Uses pill box? No - She feels like she keeps up with her medications Pt endorses 100% compliance  Miscellaneous Meds Omega 3  Vitamin C Aspirin  Benzalkonium chloride Cranberry Preservision   Meds to D/C from list Miralax  We discussed: How patient would be interested in utilizing [facility] for medication synchronization, packaging and delivery  Verbal consent obtained for [facility] enhanced pharmacy services (medication synchronization, adherence packaging, delivery coordination). A medication sync plan was created to allow patient to get all medications delivered once every 30 to 90 days per patient preference. Patient understands they have freedom to choose pharmacy and clinical pharmacist will coordinate care between all prescribers and [facility].  Follow up:  1 month bp check in 2 month phone visit  De Blanch, PharmD Clinical Pharmacist [facility] at [facility] [phone]

## 2020-04-09 ENCOUNTER — Telehealth: Payer: Self-pay | Admitting: Family Medicine

## 2020-04-09 NOTE — Telephone Encounter (Signed)
Received some outside labs from Dr Boyd Kerbs with [facility]- TSH is suppressed at 0.33 Pt does not have prior h/o thyroid disorder Will have her come in for repeat TSH/ T3 at her convenience Mel Almond can you please set her up for a non- emergent visit to see me - we can do labs at that time  [facility]

## 2020-04-10 NOTE — Telephone Encounter (Signed)
Tried calling patient, no answer. Mailbox full-unable to leave message.

## 2020-04-19 ENCOUNTER — Telehealth: Payer: Self-pay

## 2020-04-19 NOTE — Telephone Encounter (Signed)
Caller sates that her Vitamin D and Thyroid are low. Wants to let her PCP know. Taking Vitamin D as directed by Consulting MD. He said that PCP should direct care of Thyroid. Nerves in the foot are coming out and causing her to turn her foot.

## 2020-04-19 NOTE — Telephone Encounter (Signed)
Alexa [name] called to f/up on this.  She stated she had phone issues and did not know if anyone had tried to reach out to her or not as of yet.

## 2020-04-20 NOTE — Telephone Encounter (Signed)
Left message to return call-no answer. Needing more information as to what patient is needing.

## 2020-04-26 ENCOUNTER — Telehealth: Payer: Self-pay | Admitting: Family Medicine

## 2020-04-26 NOTE — Telephone Encounter (Signed)
Patient states she does not want to continue to use her itching medication. She would like to go back to what she was taking before. Patient cant  remember the names of medications.

## 2020-04-27 NOTE — Patient Instructions (Signed)
Visit Information  Goals Addressed            This Visit's Progress   . Chronic Care Management Pharmacy Care Plan       CARE PLAN ENTRY  Current Barriers:  . Chronic Disease Management support, education, and care coordination needs related to Diabetes, Hypertension, Hyperlipidemia, Asthma, Neuropathy/Pain, Depression, Allergic Rhinitis, Estrogen Deficiency, Rash   Hypertension . Pharmacist Clinical Goal(s): o Over the next 90 days, patient will work with PharmD and providers to achieve BP goal <140/90 . Current regimen:  o Valsartan 160mg  daily . Interventions: o Request patient to check BP 1-2 times per week and record . Patient self care activities - Over the next 90 days, patient will: o Check BP 1-2 times per week, document, and provide at future appointments o Ensure daily salt intake < 2300 mg/Christa Fasig  Hyperlipidemia . Pharmacist Clinical Goal(s): o Over the next 90 days, patient will work with PharmD and providers to achieve LDL goal < 100 . Current regimen:  o Diet and exercise management   . Interventions: o Discussed importance of diet . Patient self care activities - Over the next 90 days, patient will: o Work on reducing cholesterol containing foods  Diabetes . Pharmacist Clinical Goal(s): o Over the next 90 days, patient will work with PharmD and providers to maintain A1c goal <7% . Current regimen:  o Diet and exercise management   . Interventions: o Discussed importance of diet . Patient self care activities - Over the next 90 days, patient will: o Maintain a1c <7%  Health Maintenance . Pharmacist Clinical Goal(s) o Over the next 180 days, patient will work with PharmD and providers to complete health maintenance screenings . Interventions: o Recommend patient to complete DEXA Scan o Discussed importance of consuming 1200mg  of calcium daily through diet and/or supplementation . Patient self care activities - Over the next 180 days, patient  will: o Complete DEXA Scan o Assure consumption of 1200mg  of calcium daily  Rash . Pharmacist Clinical Goal(s) o Over the next 90 days, patient will work with PharmD and providers to reduce symptoms of rash . Current regimen:  o Triamcinolone 0.025% cream as needed . Interventions: o Collaboration with provider regarding disease management (derm/allergy referral?) . Patient self care activities - Over the next 90 days, patient will: o Maintain medication regimen for rash   Medication management . Pharmacist Clinical Goal(s): o Over the next 90 days, patient will work with PharmD and providers to maintain optimal medication adherence . Current pharmacy: [facility] . Interventions o Comprehensive medication review performed. o Continue current medication management strategy . Patient self care activities - Over the next 90 days, patient will: o Focus on medication adherence by filling and taking medications appropriately  o Take medications as prescribed o Report any questions or concerns to PharmD and/or provider(s)  Please see past updates related to this goal by clicking on the "Past Updates" button in the selected goal         The patient verbalized understanding of instructions provided today and agreed to receive a mailed copy of patient instruction and/or educational materials.  Telephone follow up appointment with pharmacy team member scheduled for: 06/07/2020  Melvenia Beam Taylah Dubiel, PharmD Clinical Pharmacist [facility] at [facility] [phone]

## 2020-04-27 NOTE — Telephone Encounter (Signed)
Left message to return call.  Needing more info.

## 2020-04-28 ENCOUNTER — Telehealth: Payer: Self-pay | Admitting: Family Medicine

## 2020-04-28 DIAGNOSIS — F32A Depression, unspecified: Secondary | ICD-10-CM

## 2020-04-28 DIAGNOSIS — L853 Xerosis cutis: Secondary | ICD-10-CM

## 2020-04-28 DIAGNOSIS — M4802 Spinal stenosis, cervical region: Secondary | ICD-10-CM

## 2020-04-28 DIAGNOSIS — Z634 Disappearance and death of family member: Secondary | ICD-10-CM

## 2020-04-28 DIAGNOSIS — R0981 Nasal congestion: Secondary | ICD-10-CM

## 2020-04-28 DIAGNOSIS — I10 Essential (primary) hypertension: Secondary | ICD-10-CM

## 2020-04-28 MED ORDER — BUSPIRONE HCL 7.5 MG PO TABS: 15.0000 mg | ORAL_TABLET | Freq: Two times a day (BID) | ORAL | 3 refills | Status: DC

## 2020-04-28 MED ORDER — TRIAMCINOLONE ACETONIDE 0.025 % EX CREA: TOPICAL_CREAM | CUTANEOUS | 1 refills | Status: DC

## 2020-04-28 MED ORDER — VALSARTAN 160 MG PO TABS: ORAL_TABLET | ORAL | 3 refills | Status: DC

## 2020-04-28 MED ORDER — GABAPENTIN 100 MG PO CAPS: 100.0000 mg | ORAL_CAPSULE | Freq: Three times a day (TID) | ORAL | 3 refills | Status: DC

## 2020-04-28 MED ORDER — FLUTICASONE PROPIONATE 50 MCG/ACT NA SUSP: NASAL | 3 refills | Status: DC

## 2020-04-28 MED ORDER — BUPROPION HCL 100 MG PO TABS: ORAL_TABLET | ORAL | 5 refills | Status: DC

## 2020-04-28 NOTE — Telephone Encounter (Signed)
Patient states she needs all medication refilled and sent to her house.   Patient uses [facility]

## 2020-04-28 NOTE — Telephone Encounter (Signed)
Meds refilled for mail order.

## 2020-05-04 DIAGNOSIS — R270 Ataxia, unspecified: Secondary | ICD-10-CM | POA: Diagnosis not present

## 2020-05-04 DIAGNOSIS — R201 Hypoesthesia of skin: Secondary | ICD-10-CM | POA: Diagnosis not present

## 2020-05-04 DIAGNOSIS — M79672 Pain in left foot: Secondary | ICD-10-CM | POA: Diagnosis not present

## 2020-05-04 DIAGNOSIS — M21372 Foot drop, left foot: Secondary | ICD-10-CM | POA: Diagnosis not present

## 2020-05-08 ENCOUNTER — Telehealth: Payer: Self-pay | Admitting: Pharmacist

## 2020-05-08 NOTE — Progress Notes (Addendum)
Chronic Care Management Pharmacy Assistant   Name: ARTURO FREUNDLICH  MRN: [id number] DOB: 07-06-1930  Reason for Encounter: Disease State  Patient Questions:  1.  Have you seen any other providers since your last visit?  No  2.  Any changes in your medicines or health?  No    PCP : Copland, Gay Filler, MD    Their chronic conditions include: Diabetes, Hypertension, Hyperlipidemia, Asthma, Neuropathy/Pain, Depression, Allergic Rhinitis, Estrogen Deficiency, Rash  No Office visits, Consults or Hospitalizations since last CCM visit on 04-07-20.  Allergies:   Allergies  Allergen Reactions  . Lisinopril     Cough D/Ced by Dr Melvyn Novas  . Codeine     nausea  . Verapamil     Pain in feet    Medications: Outpatient Encounter Medications as of 05/08/2020  Medication Sig Note  . Ascorbic Acid (VITAMIN C) POWD Take 1 packet by mouth 2 (two) times daily as needed (cold symptoms).   Marland Kitchen aspirin EC 81 MG tablet Take 162 mg by mouth daily.   . Benzalkonium Chloride (DIABETIC BASICS HEALTHY FOOT EX) Apply 1 application topically daily as needed (foot pain). OTC diabetic foot cream   . Blood Glucose Monitoring Suppl (TRUERESULT BLOOD GLUCOSE) W/DEVICE KIT Use to test blood sugar ICD 10 E11 9   . buPROPion (WELLBUTRIN) 100 MG tablet TAKE 1 TABLET(100 MG) BY MOUTH TWICE DAILY   . busPIRone (BUSPAR) 7.5 MG tablet Take 2 tablets (15 mg total) by mouth 2 (two) times daily. May take one 4x a day if preferred   . CALCIUM PO Take 1 tablet by mouth daily. 01/03/2020: Unsure of dosage  . Cholecalciferol (VITAMIN D PO) Take 1 tablet by mouth daily. 2000 units   . CRANBERRY PO Take 2 capsules by mouth 2 (two) times daily. 79m   . ESTRACE VAGINAL 0.1 MG/GM vaginal cream Apply locally every other night at bedtime   . fluticasone (FLONASE) 50 MCG/ACT nasal spray INSTILL 2 SPRAYS INTO EACH NOSTRIL ONCE DAILY   . gabapentin (NEURONTIN) 100 MG capsule Take 1 capsule (100 mg total) by mouth 3 (three) times daily.  Take one capsule by mouth three times daily.   .Marland Kitchenglucose blood (TRUETEST TEST) test strip Use to test blood sugar ICD 10 E11 9   . hydrOXYzine (ATARAX/VISTARIL) 10 MG tablet Take 1-2 tablets (10-20 mg total) by mouth 3 (three) times daily as needed. As needed for itching (Patient not taking: Reported on 04/07/2020)   . Multiple Vitamins-Minerals (EYE VITAMINS PO) Take by mouth.   . predniSONE (DELTASONE) 20 MG tablet Take 40 mg daily for 4 days, then 20 mg daily for 6 days, then 10 mg daily for 6 days   . triamcinolone (KENALOG) 0.025 % cream APPLY 1 APPLICATION TOPICALLY TWICE DAILY AS NEEDED FOR DRY SKIN ON BACK   . TRUEPLUS LANCETS 28G MISC Use to test blood sugar ICD 10 E11 9   . valsartan (DIOVAN) 160 MG tablet Take 1 by mouth once daily for blood pressure   . [DISCONTINUED] triamcinolone (KENALOG) 0.025 % cream APPLY 1 APPLICATION TOPICALLY TWICE DAILY AS NEEDED FOR DRY SKIN ON BACK    No facility-administered encounter medications on file as of 05/08/2020.    Current Diagnosis: Patient Active Problem List   Diagnosis Date Noted  . Solitary kidney 12/15/2018  . Cough variant asthma 12/29/2014  . Spinal stenosis in cervical region 12/13/2014  . Radiculopathy, lumbar region 12/13/2014  . Depression 05/03/2008  . PERONEAL NEUROPATHY 05/03/2008  .  Malignant neoplasm of kidney excluding renal pelvis ([place]) 08/06/2007  . Controlled type 2 diabetes mellitus without complication, without long-term current use of insulin ([place]) 08/06/2007  . GERD 08/06/2007  . PULMONARY EMBOLISM, HX OF 08/06/2007  . Hyperlipidemia 09/03/2006  . Essential hypertension 09/03/2006  . IBS 09/03/2006    Goals Addressed   None    Reviewed chart prior to disease state call. Spoke with patient regarding BP  Recent Office Vitals: BP Readings from Last 3 Encounters:  03/15/20 124/88  03/02/20 136/80  02/24/20 (!) 191/98   Pulse Readings from Last 3 Encounters:  03/15/20 89  03/02/20 (!) 58  02/24/20 93      Wt Readings from Last 3 Encounters:  03/15/20 124 lb 12.8 oz (56.6 kg)  02/24/20 127 lb (57.6 kg)  11/29/19 125 lb (56.7 kg)     Kidney Function Lab Results  Component Value Date/Time   CREATININE 0.97 11/17/2019 04:30 PM   CREATININE 0.94 08/25/2019 01:40 PM   CREATININE 1.24 (H) 04/03/2018 03:52 PM   CREATININE 1.08 05/07/2013 05:05 PM   GFR 53.67 (L) 12/16/2018 11:57 AM   GFRNONAA 53 (L) 11/17/2019 04:30 PM   GFRAA >60 11/17/2019 04:30 PM    BMP Latest Ref Rng & Units 11/17/2019 08/25/2019 12/16/2018  Glucose 70 - 99 mg/dL 130(H) 156(H) 233(H)  BUN 8 - 23 mg/dL 22 23 19   Creatinine 0.44 - 1.00 mg/dL 0.97 0.94 0.98  BUN/Creat Ratio 6 - 22 (calc) - - -  Sodium 135 - 145 mmol/L 137 139 137  Potassium 3.5 - 5.1 mmol/L 4.3 4.2 4.5  Chloride 98 - 111 mmol/L 104 105 99  CO2 22 - 32 mmol/L 23 24 28   Calcium 8.9 - 10.3 mg/dL 9.0 9.2 9.2    . Current antihypertensive regimen:  ? Valsartan 150m daily . How often are you checking your Blood Pressure? when feeling symptomatic . Current home BP readings: None  Patient states her machine is lost since she moved back into the house after a tree fell upon it. . What recent interventions/DTPs have been made by any provider to improve Blood Pressure control since last CPP Visit: None . Any recent hospitalizations or ED visits since last visit with CPP?  No . What diet changes have been made to improve Blood Pressure Control?  o States she does not cook that much.  Patient states it is hard for her to get around. . What exercise is being done to improve your Blood Pressure Control?  o Not at this moment but would like a advance home care to come out and help her.  Adherence Review: Patient is very stressed and voices her concern on wanting advance home care help for her and her husband.  Patient states she wants medication to help with her itching.  Patient states prednisone was wonderful.  Patient received medications that were sent out  and states all medication was counted. Is the patient currently on ACE/ARB medication? Yes Valsartan 1630mdaily Does the patient have >5 day gap between last estimated fill dates? No    Follow-Up:  Pharmacist Review   ChTh[place]hannon, RMRiviera Beachrimary care at [facility]acist Assistant [phone] Will coordinate with PCP team to see how to assist patient. Reviewed by: KaDe BlanchPharmD Clinical Pharmacist [phone]

## 2020-05-11 ENCOUNTER — Telehealth: Payer: Self-pay

## 2020-05-11 ENCOUNTER — Telehealth: Payer: Self-pay | Admitting: Family Medicine

## 2020-05-11 DIAGNOSIS — R2681 Unsteadiness on feet: Secondary | ICD-10-CM

## 2020-05-11 NOTE — Telephone Encounter (Signed)
Tried calling patient, no answer. Voicemail box states it is full. Needing more information on what we can do for patient.

## 2020-05-11 NOTE — Telephone Encounter (Signed)
CallerHitomi [name]  Call Back # [phone]  Patient states she needs physical therapy as well as her spouse, patient is also requesting a blood pressure machine through [facility].   Please Advise

## 2020-05-11 NOTE — Telephone Encounter (Signed)
I called her to check in She notes that she is not doing well, tells me a confusing story about seeing a ?neurologist recently who did some testing for her.  I am not sure who she saw, I do not have any notes I think they checked her TSH which was low- I had not been aware of this.  Needs to be repeated to determine if she actually has hyperthyroidism We need to get her in for a repeat TSH-however patient states she has no way to get to the clinic, she has no longer driving and there is apparently no one in the family who can help her  I am concerned that Audia and her husband [name] are no longer able to function independently and needs more help from her family.  Treva insists that everyone in her family is "too busy to help me" but I was finally able to obtain permission to call her son Abe People and discussed with him  She would like to have physical therapy for herself and her husband to work on strength and balance, this is certainly fine  Her son is Abe People- she gave me the following number which does not seem to work [phone] Called number listed for [employer] in chart - this did work, I left message,  Will try him back later

## 2020-05-11 NOTE — Telephone Encounter (Signed)
Received this message from [facility] after hours triage.

## 2020-05-11 NOTE — Telephone Encounter (Signed)
-----   Message from [place] N Shannon sent at 05/09/2020  4:41 PM EDT ----- Regarding: refill request per patient Spoke with patient and she would like a refill on her Prednisone  20 mg due to the itching she is having.  States she has the cream and it helps a little but she is still "scratching her skin off".  Patient also voice her concerned about advance home care referral.  She states she needs help with herself and husband.  States he has fallen every day recently.

## 2020-05-11 NOTE — Telephone Encounter (Signed)
Alexa [name] called back stating she knows she is not going to be able to have the prednisone refilled for her itching but her cream is not working. She is having severe itching and "clawing at her skin". She is wondering if anything else can be sent in?   She also states her and her husband are both needing physical therapy for exercising more at home. She states they both have fallen approximately 2 times this week. She states she is not driving at this time. She and her husband are both very week. She is wanting home health [facility] to come out and help them both with exercising to reduce their risk for falls.   She quested her thyroid result from Dr. Ilean Skill. Looking back at your previous note you wanted her to come in for a visit and recheck of thyroid. She states she has no way of coming in because she is not driving. She has no family or friend to bring her. She is wondering if a phone call visit would work?

## 2020-05-17 DIAGNOSIS — M4802 Spinal stenosis, cervical region: Secondary | ICD-10-CM | POA: Diagnosis not present

## 2020-05-17 DIAGNOSIS — E1140 Type 2 diabetes mellitus with diabetic neuropathy, unspecified: Secondary | ICD-10-CM | POA: Diagnosis not present

## 2020-05-17 DIAGNOSIS — K589 Irritable bowel syndrome without diarrhea: Secondary | ICD-10-CM | POA: Diagnosis not present

## 2020-05-17 DIAGNOSIS — I10 Essential (primary) hypertension: Secondary | ICD-10-CM | POA: Diagnosis not present

## 2020-05-17 DIAGNOSIS — M5416 Radiculopathy, lumbar region: Secondary | ICD-10-CM | POA: Diagnosis not present

## 2020-05-17 DIAGNOSIS — J449 Chronic obstructive pulmonary disease, unspecified: Secondary | ICD-10-CM | POA: Diagnosis not present

## 2020-05-17 DIAGNOSIS — K5730 Diverticulosis of large intestine without perforation or abscess without bleeding: Secondary | ICD-10-CM | POA: Diagnosis not present

## 2020-05-17 DIAGNOSIS — E785 Hyperlipidemia, unspecified: Secondary | ICD-10-CM | POA: Diagnosis not present

## 2020-05-17 DIAGNOSIS — F32A Depression, unspecified: Secondary | ICD-10-CM | POA: Diagnosis not present

## 2020-05-17 NOTE — Telephone Encounter (Signed)
Called and LMOM for her son again- I am concerned that his mom and dad need more support from the family.  Please check in on them

## 2020-05-22 ENCOUNTER — Other Ambulatory Visit (INDEPENDENT_AMBULATORY_CARE_PROVIDER_SITE_OTHER): Payer: Medicare HMO

## 2020-05-22 ENCOUNTER — Other Ambulatory Visit: Payer: Self-pay

## 2020-05-22 ENCOUNTER — Telehealth: Payer: Self-pay | Admitting: Family Medicine

## 2020-05-22 DIAGNOSIS — I10 Essential (primary) hypertension: Secondary | ICD-10-CM | POA: Diagnosis not present

## 2020-05-22 DIAGNOSIS — F32A Depression, unspecified: Secondary | ICD-10-CM | POA: Diagnosis not present

## 2020-05-22 DIAGNOSIS — M25522 Pain in left elbow: Secondary | ICD-10-CM

## 2020-05-22 DIAGNOSIS — R7989 Other specified abnormal findings of blood chemistry: Secondary | ICD-10-CM | POA: Diagnosis not present

## 2020-05-22 MED ORDER — ACETAMINOPHEN-CODEINE #3 300-30 MG PO TABS: 1.0000 | ORAL_TABLET | Freq: Three times a day (TID) | ORAL | 0 refills | Status: DC | PRN

## 2020-05-22 MED ORDER — AMOXICILLIN-POT CLAVULANATE 500-125 MG PO TABS: 1.0000 | ORAL_TABLET | Freq: Two times a day (BID) | ORAL | 0 refills | Status: DC

## 2020-05-22 NOTE — Telephone Encounter (Addendum)
Here today with her husband who has an appt. In passing, she has a couple of concerns herself She would like to recheck her TSH which we can certainly do She also mentions that about 5 days ago she nearly fell- she caught herself but scraped her left elbow against the side of the bathtub.  She did not hit her elbow hard- however a day or so later she developed some superficial redness and pain as well as warmth.  This has gotten a bit better but is still tender  She has superficial redness over the left posterior elbow-may have some cellulitis. The joint itself does not seem to be involved, she has ok ROM.  Do not suspect a fracture due to mechanism of injury.  Will call in augmentin for her to take for cellulitis  She also request tylenol #3 to use for pain - reports she can tolerate this ok and is not intolerant to this medication  I advised her to let me know if the elbow is not significantly better within 48 hours- Sooner if worse.  She agrees with this plan

## 2020-05-23 ENCOUNTER — Encounter: Payer: Self-pay | Admitting: Family Medicine

## 2020-05-23 LAB — TSH: TSH: 1.62 mIU/L (ref 0.40–4.50)

## 2020-05-24 DIAGNOSIS — E785 Hyperlipidemia, unspecified: Secondary | ICD-10-CM | POA: Diagnosis not present

## 2020-05-24 DIAGNOSIS — J449 Chronic obstructive pulmonary disease, unspecified: Secondary | ICD-10-CM | POA: Diagnosis not present

## 2020-05-24 DIAGNOSIS — K5730 Diverticulosis of large intestine without perforation or abscess without bleeding: Secondary | ICD-10-CM | POA: Diagnosis not present

## 2020-05-24 DIAGNOSIS — M5416 Radiculopathy, lumbar region: Secondary | ICD-10-CM | POA: Diagnosis not present

## 2020-05-24 DIAGNOSIS — M4802 Spinal stenosis, cervical region: Secondary | ICD-10-CM | POA: Diagnosis not present

## 2020-05-24 DIAGNOSIS — F32A Depression, unspecified: Secondary | ICD-10-CM | POA: Diagnosis not present

## 2020-05-24 DIAGNOSIS — K589 Irritable bowel syndrome without diarrhea: Secondary | ICD-10-CM | POA: Diagnosis not present

## 2020-05-24 DIAGNOSIS — I10 Essential (primary) hypertension: Secondary | ICD-10-CM | POA: Diagnosis not present

## 2020-05-24 DIAGNOSIS — E1140 Type 2 diabetes mellitus with diabetic neuropathy, unspecified: Secondary | ICD-10-CM | POA: Diagnosis not present

## 2020-05-26 DIAGNOSIS — E1140 Type 2 diabetes mellitus with diabetic neuropathy, unspecified: Secondary | ICD-10-CM | POA: Diagnosis not present

## 2020-05-26 DIAGNOSIS — J449 Chronic obstructive pulmonary disease, unspecified: Secondary | ICD-10-CM | POA: Diagnosis not present

## 2020-05-26 DIAGNOSIS — F32A Depression, unspecified: Secondary | ICD-10-CM | POA: Diagnosis not present

## 2020-05-26 DIAGNOSIS — M5416 Radiculopathy, lumbar region: Secondary | ICD-10-CM | POA: Diagnosis not present

## 2020-05-26 DIAGNOSIS — E785 Hyperlipidemia, unspecified: Secondary | ICD-10-CM | POA: Diagnosis not present

## 2020-05-26 DIAGNOSIS — M4802 Spinal stenosis, cervical region: Secondary | ICD-10-CM | POA: Diagnosis not present

## 2020-05-26 DIAGNOSIS — I10 Essential (primary) hypertension: Secondary | ICD-10-CM | POA: Diagnosis not present

## 2020-05-26 DIAGNOSIS — K589 Irritable bowel syndrome without diarrhea: Secondary | ICD-10-CM | POA: Diagnosis not present

## 2020-05-26 DIAGNOSIS — K5730 Diverticulosis of large intestine without perforation or abscess without bleeding: Secondary | ICD-10-CM | POA: Diagnosis not present

## 2020-05-29 ENCOUNTER — Telehealth: Payer: Self-pay | Admitting: Family Medicine

## 2020-05-29 ENCOUNTER — Other Ambulatory Visit (HOSPITAL_BASED_OUTPATIENT_CLINIC_OR_DEPARTMENT_OTHER): Payer: Medicare HMO

## 2020-05-29 NOTE — Telephone Encounter (Signed)
Caller name: Diane  Financial risk analyst) Call back number:  Nurse states she triage patient. Patient complaint of a fall with a severe hip pain. Patient was recommended to go to ED. Patient refuse, no sob. Please advise pt

## 2020-05-29 NOTE — Telephone Encounter (Signed)
Nurse Assessment Nurse: Zenia Resides, RN, Diane Date/Time Eilene Ghazi Time): 05/29/2020 10:39:19 AM Confirm and document reason for call. If symptomatic, describe symptoms. ---Caller states she fell Saturday evening and landed on her right side in her rib area. Pt. is using a heating pad and lidocaine patches and not helping the pain. Hurts to take a deep breath, but breathing okay. Can move right arm okay. No swelling. Pain is severe. Pain medication isn't helping either. Does the patient have any new or worsening symptoms? ---Yes Will a triage be completed? ---Yes Related visit to physician within the last 2 weeks? ---No Does the PT have any chronic conditions? (i.e. diabetes, asthma, this includes High risk factors for pregnancy, etc.) ---Yes List chronic conditions. ---one kidney, COPD, HTN Is this a behavioral health or substance abuse call? ---No Guidelines Guideline Title Affirmed Question Affirmed Notes Nurse Date/Time Eilene Ghazi Time) Chest Injury SEVERE chest pain Zenia Resides, RN, Diane 05/29/2020 10:42:58 AM Disp. Time Eilene Ghazi Time) Disposition Final User 05/29/2020 10:45:29 AM Go to ED Now Yes Zenia Resides, RN, Diane Caller Disagree/Comply Disagree PLEASE NOTE: All timestamps contained within this report are represented as [place] Standard Time. CONFIDENTIALTY NOTICE: This fax transmission is intended only for the addressee. It contains information that is legally privileged, confidential or otherwise protected from use or disclosure. If you are not the intended recipient, you are strictly prohibited from reviewing, disclosing, copying using or disseminating any of this information or taking any action in reliance on or regarding this information. If you have received this fax in error, please notify us immediately by telephone so that we can arrange for its return to [place]. Phone: [phone], Toll-Free: [phone], Fax: [phone] Page: 2 of 2 Call Id: [id number] [place] Understands  Yes PreDisposition InappropriateToAsk Care Advice Given Per Guideline * You need to be seen in the Emergency Department. GO TO ED NOW: NOTE TO TRIAGER - DRIVING: * Another adult should drive. CARE ADVICE given per Chest Injury (Adult) guideline. Comments User: Hildred Priest, RN Date/Time Eilene Ghazi Time): 05/29/2020 10:50:48 AM Back line number not working. User: Hildred Priest, RN Date/Time Eilene Ghazi Time): 05/29/2020 10:51:27 AM Pt. also had questions about a missing prescription. User: Hildred Priest, RN Date/Time Eilene Ghazi Time): 05/29/2020 11:00:35 AM Spoke with the office who will call the patient Referrals [facility] REFUSED

## 2020-05-29 NOTE — Telephone Encounter (Signed)
Caller name: Sharyn Lull social worker [facility] home health Call back number: [phone]  Patient had aw fallen (right side rib pain). Patient refused to go to the ED. Sharyn Lull is asking for an order for mobile x-ray

## 2020-05-30 DIAGNOSIS — K5730 Diverticulosis of large intestine without perforation or abscess without bleeding: Secondary | ICD-10-CM | POA: Diagnosis not present

## 2020-05-30 DIAGNOSIS — F32A Depression, unspecified: Secondary | ICD-10-CM | POA: Diagnosis not present

## 2020-05-30 DIAGNOSIS — E785 Hyperlipidemia, unspecified: Secondary | ICD-10-CM | POA: Diagnosis not present

## 2020-05-30 DIAGNOSIS — K589 Irritable bowel syndrome without diarrhea: Secondary | ICD-10-CM | POA: Diagnosis not present

## 2020-05-30 DIAGNOSIS — J449 Chronic obstructive pulmonary disease, unspecified: Secondary | ICD-10-CM | POA: Diagnosis not present

## 2020-05-30 DIAGNOSIS — E1140 Type 2 diabetes mellitus with diabetic neuropathy, unspecified: Secondary | ICD-10-CM | POA: Diagnosis not present

## 2020-05-30 DIAGNOSIS — M4802 Spinal stenosis, cervical region: Secondary | ICD-10-CM | POA: Diagnosis not present

## 2020-05-30 DIAGNOSIS — I10 Essential (primary) hypertension: Secondary | ICD-10-CM | POA: Diagnosis not present

## 2020-05-30 DIAGNOSIS — M5416 Radiculopathy, lumbar region: Secondary | ICD-10-CM | POA: Diagnosis not present

## 2020-05-30 NOTE — Telephone Encounter (Signed)
Tried calling patient-no answer. Voicemail box full unable to leave a message.

## 2020-05-31 ENCOUNTER — Ambulatory Visit (HOSPITAL_BASED_OUTPATIENT_CLINIC_OR_DEPARTMENT_OTHER)
Admission: RE | Admit: 2020-05-31 | Discharge: 2020-05-31 | Disposition: A | Discharge status: Home / Self Care | Payer: Medicare HMO | Source: Ambulatory Visit | Attending: Family Medicine | Admitting: Family Medicine

## 2020-05-31 ENCOUNTER — Other Ambulatory Visit: Payer: Self-pay

## 2020-05-31 ENCOUNTER — Telehealth: Payer: Self-pay

## 2020-05-31 ENCOUNTER — Encounter: Payer: Self-pay | Admitting: Family Medicine

## 2020-05-31 ENCOUNTER — Telehealth: Payer: Self-pay | Admitting: Family Medicine

## 2020-05-31 ENCOUNTER — Ambulatory Visit (INDEPENDENT_AMBULATORY_CARE_PROVIDER_SITE_OTHER): Payer: Medicare HMO | Admitting: Family Medicine

## 2020-05-31 VITALS — BP 120/72 | HR 78 | Resp 18 | Ht 63.0 in | Wt 126.0 lb

## 2020-05-31 DIAGNOSIS — M4186 Other forms of scoliosis, lumbar region: Secondary | ICD-10-CM | POA: Diagnosis not present

## 2020-05-31 DIAGNOSIS — R0781 Pleurodynia: Secondary | ICD-10-CM | POA: Diagnosis not present

## 2020-05-31 DIAGNOSIS — M7022 Olecranon bursitis, left elbow: Secondary | ICD-10-CM

## 2020-05-31 DIAGNOSIS — Y92009 Unspecified place in unspecified non-institutional (private) residence as the place of occurrence of the external cause: Secondary | ICD-10-CM | POA: Diagnosis not present

## 2020-05-31 DIAGNOSIS — R109 Unspecified abdominal pain: Secondary | ICD-10-CM | POA: Diagnosis not present

## 2020-05-31 DIAGNOSIS — S2241XA Multiple fractures of ribs, right side, initial encounter for closed fracture: Secondary | ICD-10-CM | POA: Diagnosis not present

## 2020-05-31 DIAGNOSIS — R918 Other nonspecific abnormal finding of lung field: Secondary | ICD-10-CM | POA: Diagnosis not present

## 2020-05-31 DIAGNOSIS — W19XXXA Unspecified fall, initial encounter: Secondary | ICD-10-CM | POA: Diagnosis not present

## 2020-05-31 DIAGNOSIS — M47816 Spondylosis without myelopathy or radiculopathy, lumbar region: Secondary | ICD-10-CM | POA: Diagnosis not present

## 2020-05-31 MED ORDER — ACETAMINOPHEN-CODEINE #3 300-30 MG PO TABS
1.0000 | ORAL_TABLET | Freq: Three times a day (TID) | ORAL | 0 refills | Status: DC | PRN
Start: 2020-05-31 — End: 2020-07-12

## 2020-05-31 NOTE — Progress Notes (Signed)
[place] at [facility] [address] [place], [place] [address] [phone]  Date:  05/31/2020   Name:  Alexa [name]   DOB:  March 25, 1930   MRN:  [id number]  PCP:  Darreld Mclean, MD    Chief Complaint: Fall and Edema (bilateral leg/feet swelling. )   History of Present Illness:  Alexa [name] is a 86 y.o. very pleasant female patient who presents with the following:  Pt with history of medical problems as below, here today following a fall which occurred on 05/27/20.  Pt called on November 1 and was triaged to the emergency room, however she declined to go.  We tried to get in touch with her yesterday were unable to do so.  Today the patient called [place] back and we made this appointment  On Saturday, 10/30 she tripped over a box at home and fell on a box of books- She landed on her right ribs on the edge of the box.  Today is Wednesday She had some tylenol with codeine that she took for the pain.  She at first says this is too strong and keeps her awake at night, then says it is not strong enough.  She has about 8 pills left in her prescription  She also states that she has not had a bowel movement in 5 days, suspect this is due to use of Tylenol 3.  She does take what sounds like a stool softener daily.  I recommended that she buy MiraLAX from our in-house pharmacy downstairs, take 1-2 doses daily and will affect  On 10/25 the patient accompanyied her husband to the office but did not have an appointment herself; she was concerned about her left elbow.  She had hit her elbow on the side of the bathtub, then developed some superficial warmth and redness.  This seemed to be getting better.  At that time I gave her a prescription for Tylenol 3 and Augmentin, asked her to let me know if not significantly better in 48 hours. Today the patient states her elbow has continued to get worse and is now very painful.  She finished the antibiotic  A  major complicating issue is that the patient and her husband live on their own, they seem to have minimal family support.  They are having frequent falls at home, becoming less and less able to manage their health and home on their own.  They are often confused about medication, and are not able to appropriately seek help for themselves.   Alexa [name]'s brother Alexa [name] came with them last week and seems to be supportive.  I called him to discuss their situation- however, the number that Takira gave me for him does not seem to work, it rings busy [phone]  I have called her son, Alexa [name] a few weeks ago and called him again today.  I left another message, asked him to please call us back.  Advised on message that I am concerned about his mom and dad and will need to call protective services unless I can get family involved in their care   Addendum-I was able to get in touch with their son Alexa [name] and his wife Alexa [name] this evening at around 8 PM. They report that they have been unable to get Niharika and Alexa [name] to leave their home and move into a more appropriate environment.  They do live in [place] and not are not able  to provide in-home care for the couple.  They would welcome intervention from Adult Protective Services as they have been unsuccessful in convincing [place] that their current situation is not safe and needs to be changed They report that they still care for their great- great- grandchildren after school, and they are concerned about the safety of this situation and also safety with driving  Patient Active Problem List   Diagnosis Date Noted  . Solitary kidney 12/15/2018  . Cough variant asthma 12/29/2014  . Spinal stenosis in cervical region 12/13/2014  . Radiculopathy, lumbar region 12/13/2014  . Depression 05/03/2008  . PERONEAL NEUROPATHY 05/03/2008  . Malignant neoplasm of kidney excluding renal pelvis ([place]) 08/06/2007  . Controlled type 2 diabetes mellitus without  complication, without long-term current use of insulin ([place]) 08/06/2007  . GERD 08/06/2007  . PULMONARY EMBOLISM, HX OF 08/06/2007  . Hyperlipidemia 09/03/2006  . Essential hypertension 09/03/2006  . IBS 09/03/2006    Past Medical History:  Diagnosis Date  . Asthma   . Chronic obstructive pulmonary disease ([place])   . Diverticulosis   . Diverticulosis of colon (without mention of hemorrhage)   . Esophageal reflux   . History of colonic polyps 06/05/05   hyperplastic  . Internal hemorrhoids   . Irritable bowel syndrome   . Ischemic colitis ([place])   . Malignant neoplasm of kidney and other and unspecified urinary organs    renal carcinoma, left nephrectomy in 2008-per patient.  . Other and unspecified hyperlipidemia   . Personal history of venous thrombosis and embolism    PTE after nephrectomy  . Spinal stenosis   . Type II or unspecified type diabetes mellitus without mention of complication, not stated as uncontrolled 02/05/2012   pt states it was in 2007  . Unspecified essential hypertension     Past Surgical History:  Procedure Laterality Date  . BREAST BIOPSY    . COLONOSCOPY W/ POLYPECTOMY  2000  . COLONOSCOPY W/ POLYPECTOMY  07/2004   Hyperplastic polyps  . DILATION AND CURETTAGE OF UTERUS  03/2005   Uterine Polyps  . NEPHRECTOMY  2007   right,Dr Dahlstedt  . SEPTOPLASTY    . TONSILLECTOMY      Social History   Tobacco Use  . Smoking status: Never Smoker  . Smokeless tobacco: Never Used  Substance Use Topics  . Alcohol use: No  . Drug use: No    Family History  Problem Relation Age of Onset  . Stroke Mother        onset in 35s  . Diabetes Mother   . Cancer Mother        bladder; nephrectomy for calculi/also uterine , TAH & BSO  . Aneurysm Mother        thoracic  . Depression Mother   . Stroke Brother   . Heart failure Brother   . Heart attack Other        maternal family history  . Heart failure Maternal Grandfather   . Lung cancer Father         smoked  . Heart failure Sister   . Depression Sister   . Alzheimer's disease Sister   . COPD Sister   . Aneurysm Brother        cns  . Depression Maternal Aunt        X2  . Bipolar disorder Sister   . Alcoholism Neg Hx     Allergies  Allergen Reactions  . Lisinopril     Cough  D/Ced by Dr Melvyn Novas  . Codeine     nausea  . Verapamil     Pain in feet    Medication list has been reviewed and updated.  Current Outpatient Medications on File Prior to Visit  Medication Sig Dispense Refill  . acetaminophen-codeine (TYLENOL #3) 300-30 MG tablet Take 1-2 tablets by mouth every 8 (eight) hours as needed for moderate pain. 30 tablet 0  . Ascorbic Acid (VITAMIN C) POWD Take 1 packet by mouth 2 (two) times daily as needed (cold symptoms).    Marland Kitchen aspirin EC 81 MG tablet Take 162 mg by mouth daily.    . Benzalkonium Chloride (DIABETIC BASICS HEALTHY FOOT EX) Apply 1 application topically daily as needed (foot pain). OTC diabetic foot cream    . Blood Glucose Monitoring Suppl (TRUERESULT BLOOD GLUCOSE) W/DEVICE KIT Use to test blood sugar ICD 10 E11 9 1 each 0  . buPROPion (WELLBUTRIN) 100 MG tablet TAKE 1 TABLET(100 MG) BY MOUTH TWICE DAILY 60 tablet 5  . busPIRone (BUSPAR) 7.5 MG tablet Take 2 tablets (15 mg total) by mouth 2 (two) times daily. May take one 4x a day if preferred 360 tablet 3  . CALCIUM PO Take 1 tablet by mouth daily.    . Cholecalciferol (VITAMIN D PO) Take 1 tablet by mouth daily. 2000 units    . CRANBERRY PO Take 2 capsules by mouth 2 (two) times daily. 36m    . ESTRACE VAGINAL 0.1 MG/GM vaginal cream Apply locally every other night at bedtime  1  . fluticasone (FLONASE) 50 MCG/ACT nasal spray INSTILL 2 SPRAYS INTO EACH NOSTRIL ONCE DAILY 48 g 3  . gabapentin (NEURONTIN) 100 MG capsule Take 1 capsule (100 mg total) by mouth 3 (three) times daily. Take one capsule by mouth three times daily. 270 capsule 3  . glucose blood (TRUETEST TEST) test strip Use to test blood sugar ICD  10 E11 9 100 each 3  . hydrOXYzine (ATARAX/VISTARIL) 10 MG tablet Take 1-2 tablets (10-20 mg total) by mouth 3 (three) times daily as needed. As needed for itching 60 tablet 3  . Multiple Vitamins-Minerals (EYE VITAMINS PO) Take by mouth.    . predniSONE (DELTASONE) 20 MG tablet Take 40 mg daily for 4 days, then 20 mg daily for 6 days, then 10 mg daily for 6 days 20 tablet 0  . triamcinolone (KENALOG) 0.025 % cream APPLY 1 APPLICATION TOPICALLY TWICE DAILY AS NEEDED FOR DRY SKIN ON BACK 80 g 1  . TRUEPLUS LANCETS 28G MISC Use to test blood sugar ICD 10 E11 9 100 each 3  . valsartan (DIOVAN) 160 MG tablet Take 1 by mouth once daily for blood pressure 90 tablet 3  . [DISCONTINUED] triamcinolone (KENALOG) 0.025 % cream APPLY 1 APPLICATION TOPICALLY TWICE DAILY AS NEEDED FOR DRY SKIN ON BACK 80 g 1   No current facility-administered medications on file prior to visit.    Review of Systems:  As per HPI- otherwise negative.   Physical Examination: Vitals:   05/31/20 1416  BP: 120/72  Pulse: 78  Resp: 18  SpO2: 97%   Vitals:   05/31/20 1416  Weight: 126 lb (57.2 kg)  Height: _0  (1.6 m)   Body mass index is 22.32 kg/m. Ideal Body Weight: Weight in (lb) to have BMI = 25: 140.8  GEN: no acute distress.  Elderly woman, normal weight  HEENT: Atraumatic, Normocephalic.  Ears and Nose: No external deformity. CV: RRR, No M/G/R. No JVD. No  thrill. No extra heart sounds. PULM: CTA B, no wheezes, crackles, rhonchi. No retractions. No resp. distress. No accessory muscle use. ABD: S, NT, ND, +BS. No rebound. No HSM. EXTR: No c/c/mild soft edema of both ankles is present, likely due to venous stasis Calves are soft and not edematous PSYCH: Normally interactive. Conversant.  Left elbow: pt has evidence of olecranon bursitis with possible infection.  There is increasing warmth, redness, tenderness around the elbow joint Tenderness over the lower right ribs- possible rib fracture  There is  one small bruise over the ribs No abdominal bruising, I do not appreciate tenderness in the abdomen, only on the ribs   Assessment and Plan: Fall in home, initial encounter  Rib pain - Plan: DG Ribs Unilateral W/Chest Right, DG Abd 2 Views  Olecranon bursitis of left elbow  Patient is here today with a couple of concerns. She fell 4 days ago and hurt her right side, suspect she has fractured ribs.  Will obtain x-rays of her right ribs today.  She also notes some stomach discomfort which she attributes to not having a bowel movement in several days.  This is likely due to use of Tylenol with codeine.  I advised her to use MiraLAX, 1-2 doses a day until effect We will call with her x-ray reports later today  Greater concern today is actually her left elbow, it does appear that she has an infected bursitis.  I was able to make her orthopedics appointment for tomorrow afternoon, we have provided detailed instructions about this appointment and printed a map and directions from their home  If I am not able to contact family for this patient, I will plan to contact Adult Protective Services as I am concerned about their safety and well being  [place] Ms. Vilinda Blanks, Director [phone] [phone] ([address]) [place], [place] [address]  Tel. [phone] [facility]) Fax # [phone] Courier Number: [phone]  This visit occurred during the SARS-CoV-2 public health emergency.  Safety protocols were in place, including screening questions prior to the visit, additional usage of staff PPE, and extensive cleaning of exam room while observing appropriate contact time as indicated for disinfecting solutions.   Signed Lamar Blinks, MD  I received her x-ray reports and called patient approximately 4 PM Normal bowel gas pattern is reassuring that she does not have a small bowel obstruction.  She did not end up getting MiraLAX today as I had recommended.   She may be able to obtain this later  I advised her that she does have 3 broken ribs.  This injury occurred 5 days ago and she is hemodynamically stable/lungs are clear, unlikely that hospital admission would be of benefit.  I have asked her to please contact me if she is getting worse in any way.  I will refill her Tylenol 3 to use as needed for pain  DG Ribs Unilateral W/Chest Right  Result Date: 05/31/2020 CLINICAL DATA:  86 year old post fall onto side with right rib pain. Fall 4 days ago. EXAM: RIGHT RIBS AND CHEST - 3+ VIEW COMPARISON:  Chest radiograph 11/17/2019 FINDINGS: Acute minimally displaced fractures of posterior right ninth and tenth ribs, with nondisplaced fracture of right posterolateral eighth rib. There is no evidence of pneumothorax or pleural effusion. Mild chronic hyperinflation and blunting of the costophrenic angles. Heart is normal in size with stable aortic tortuosity and atherosclerosis. IMPRESSION: 1. Acute minimally displaced posterior right ninth and tenth rib fractures. Nondisplaced  right posterolateral eighth rib fracture. 2. No pneumothorax or pulmonary complication. Electronically Signed   By: Keith Rake M.D.   On: 05/31/2020 15:39   DG Abd 2 Views  Result Date: 05/31/2020 CLINICAL DATA:  Right flank pain radiating anteriorly EXAM: ABDOMEN - 2 VIEW COMPARISON:  11/10/2006 FINDINGS: Supine and upright frontal views of the abdomen and pelvis demonstrate an unremarkable bowel gas pattern. Gas and stool are seen throughout the colon to the level of the rectum. No free gas in the greater peritoneal sac. No masses or abnormal calcifications. Lung bases are clear. Multilevel lumbar spondylosis with right convex scoliosis centered at L2. No acute fracture. IMPRESSION: 1. Unremarkable bowel gas pattern. Electronically Signed   By: Randa Ngo M.D.   On: 05/31/2020 15:54

## 2020-05-31 NOTE — Telephone Encounter (Signed)
Caller name: Wynona Canes (Daughter in law) Call back number: [phone]  Wynona Canes states Alexa [name] got a voicemail in regards to your calling adult protecting services. She states they have tried to help the patient numerous time. She states patient refuse their help "is not of your business". The patient refuses to use walker, cane or any other suggestion from them. They have suggested for patient to go into an assisting living, pt refuses. Wynona Canes states they need help and will be best for you to go ahead and call adult protective services.

## 2020-05-31 NOTE — Telephone Encounter (Signed)
Tried calling patient again yesterday and today on number provided, still no answer. Unable to leave message due to full voicemail.   Tried calling cell phone, no answer. Did leave message to return call as soon as she can. There are two phone encounters.

## 2020-05-31 NOTE — Telephone Encounter (Signed)
Nurse Assessment Nurse: Doy Mince, RN, Secundino Ginger Date/Time (Eastern Time): 05/30/2020 10:54:15 PM Confirm and document reason for call. If symptomatic, describe symptoms. ---Caller says her feet are swollen all the way up to her knees, started yesterday. Has had an infection on her elbow for a month now. Went to MD Monday, and was given an antibiotic. She fell and she hurt herself on the right side. The pain is unusual to her and she can't even go to bed because it hurts so bad. Does the patient have any new or worsening symptoms? ---Yes Will a triage be completed? ---Yes Related visit to physician within the last 2 weeks? ---No Does the PT have any chronic conditions? (i.e. diabetes, asthma, this includes High risk factors for pregnancy, etc.) ---Yes List chronic conditions. ---copd Is this a behavioral health or substance abuse call? ---No Guidelines Guideline Title Affirmed Question Affirmed Notes Nurse Date/Time Eilene Ghazi Time) Leg Swelling and Edema SEVERE leg swelling (e.g., swelling extends above knee, entire leg is swollen, weeping fluid) Doy Mince, RN, Secundino Ginger 05/30/2020 10:56:49 PM Disp. Time Eilene Ghazi Time) Disposition Final User 05/30/2020 11:02:11 PM See HCP within 4 Hours (or PCP triage) Yes Doy Mince, RN, Secundino Ginger PLEASE NOTE: All timestamps contained within this report are represented as [place] Standard Time. CONFIDENTIALTY NOTICE: This fax transmission is intended only for the addressee. It contains information that is legally privileged, confidential or otherwise protected from use or disclosure. If you are not the intended recipient, you are strictly prohibited from reviewing, disclosing, copying using or disseminating any of this information or taking any action in reliance on or regarding this information. If you have received this fax in error, please notify us immediately by telephone so that we can arrange for its return to [place]. Phone: [phone], Toll-Free: [phone],  Fax: [phone] Page: 2 of 2 Call Id: [id number] [place] Disagree/Comply Comply Caller Understands Yes PreDisposition Did not know what to do Care Advice Given Per Guideline SEE HCP (OR PCP TRIAGE) WITHIN 4 HOURS: * ED: Patients who may need surgery or hospital admission need to be sent to an ED. So do most patients with serious symptoms or complex medical problems. CALL BACK IF: * You become worse CARE ADVICE given per Leg Swelling and Edema (Adult) guideline. Referrals GO TO FACILITY UNDECIDED

## 2020-05-31 NOTE — Patient Instructions (Addendum)
It was good to see you again today, I am sorry that you fell and got hurt I suspect you have a rib fracture.  Please go to imaging on the ground floor today and have films taken.  I will call with your reports.  Most rib fractures will heal on their own but can be painful!  We can give you more tylenol #3 if need be We will also get a film of your abdomen- I suspect you are constipated  I am concerned that your elbow may be infected- it is very important that we have you see a specialist for this concern  I made you an orthopedic appt tomorrow at [facility]opedic [facility] 3:00 pm.  Please arrive at 2:45.  You are seeing Earlie Lou PA-C  [address] [place] [address] [phone]

## 2020-05-31 NOTE — Telephone Encounter (Signed)
Patient called back this morning returning my call. She states she had a bad fall on Saturday and hurt her ribs. She states she has a history of fractured rib but "this feels different and worse" She is unsure of any bruising or swelling. When asking her about over he counter pain relief she states she is taking Tylenol #3 as prescribed however it is not helping. She also states she has had swelling in her lower extremities-she was given Augmentin 500-125 mg on 05/22/2020 for an ongoing infection on her elbow and was thinking this would help with the swelling.   During our conversation she expressed how "neglectful we were to her not returning her phone calls" I expressed to her that she called after hours while we are not open therefore a triage nurse talks with her and sends [place] a note. I then returned the call with the number provided on the triage note. However when calling that number no answer after several attempts and voicemail was full. I tried her cell phone listed as her secondary number this morning. Still no answer however I was able to leave a message. I expressed to her my several attempts and I would never neglect her however we are very busy and I called her as soon as I could in a timely manner. I expressed my apologies to her and advised her if she ever needs immediate help to proceed to the ER. She states "im not sitting in an ER for hours"   I spoke with PCP she agreed to see her at 3:00 work in. Marjani asked me "can I be seen sooner?" I expressed her schedule does not have anything sooner. She stated "Well if you just would ask her im sure she would let me." Patient on the schedule for 2:00. She expressed before hanging up "it is about time someone helped me in this office"

## 2020-06-01 DIAGNOSIS — M25522 Pain in left elbow: Secondary | ICD-10-CM | POA: Diagnosis not present

## 2020-06-01 DIAGNOSIS — R202 Paresthesia of skin: Secondary | ICD-10-CM | POA: Diagnosis not present

## 2020-06-01 DIAGNOSIS — M21372 Foot drop, left foot: Secondary | ICD-10-CM | POA: Diagnosis not present

## 2020-06-01 DIAGNOSIS — R270 Ataxia, unspecified: Secondary | ICD-10-CM | POA: Diagnosis not present

## 2020-06-01 DIAGNOSIS — M7022 Olecranon bursitis, left elbow: Secondary | ICD-10-CM | POA: Diagnosis not present

## 2020-06-01 DIAGNOSIS — M545 Low back pain, unspecified: Secondary | ICD-10-CM | POA: Diagnosis not present

## 2020-06-01 NOTE — Telephone Encounter (Signed)
Fyi phone note sent to me. Provider handling situation currently.

## 2020-06-02 ENCOUNTER — Telehealth: Payer: Self-pay | Admitting: Family Medicine

## 2020-06-02 NOTE — Telephone Encounter (Signed)
Called pt to check on her- her elbow is doing ok, she saw ortho and had some fluid drained.  Her ribs are getting better,  Overall she is ok and will let me know what else she needs

## 2020-06-06 ENCOUNTER — Other Ambulatory Visit (HOSPITAL_BASED_OUTPATIENT_CLINIC_OR_DEPARTMENT_OTHER): Payer: Medicare HMO

## 2020-06-07 ENCOUNTER — Ambulatory Visit: Payer: Medicare HMO | Admitting: Pharmacist

## 2020-06-07 DIAGNOSIS — E119 Type 2 diabetes mellitus without complications: Secondary | ICD-10-CM

## 2020-06-07 DIAGNOSIS — J449 Chronic obstructive pulmonary disease, unspecified: Secondary | ICD-10-CM | POA: Diagnosis not present

## 2020-06-07 DIAGNOSIS — K5730 Diverticulosis of large intestine without perforation or abscess without bleeding: Secondary | ICD-10-CM | POA: Diagnosis not present

## 2020-06-07 DIAGNOSIS — I10 Essential (primary) hypertension: Secondary | ICD-10-CM | POA: Diagnosis not present

## 2020-06-07 DIAGNOSIS — E785 Hyperlipidemia, unspecified: Secondary | ICD-10-CM | POA: Diagnosis not present

## 2020-06-07 DIAGNOSIS — M4802 Spinal stenosis, cervical region: Secondary | ICD-10-CM | POA: Diagnosis not present

## 2020-06-07 DIAGNOSIS — M5416 Radiculopathy, lumbar region: Secondary | ICD-10-CM | POA: Diagnosis not present

## 2020-06-07 DIAGNOSIS — E1140 Type 2 diabetes mellitus with diabetic neuropathy, unspecified: Secondary | ICD-10-CM | POA: Diagnosis not present

## 2020-06-07 DIAGNOSIS — K589 Irritable bowel syndrome without diarrhea: Secondary | ICD-10-CM | POA: Diagnosis not present

## 2020-06-07 DIAGNOSIS — F32A Depression, unspecified: Secondary | ICD-10-CM | POA: Diagnosis not present

## 2020-06-07 NOTE — Patient Instructions (Signed)
Visit Information  Goals Addressed            This Visit's Progress    Chronic Care Management Pharmacy Care Plan       CARE PLAN ENTRY (see longitudinal plan of care for additional care plan information)  Current Barriers:   Chronic Disease Management support, education, and care coordination needs related to Diabetes, Hypertension, Hyperlipidemia, Asthma, Neuropathy/Pain, Depression, Allergic Rhinitis, Estrogen Deficiency, Rash   Hypertension BP Readings from Last 3 Encounters:  05/31/20 120/72  03/15/20 124/88  03/02/20 136/80    Pharmacist Clinical Goal(s): o Over the next 90 days, patient will work with PharmD and providers to maintain BP goal <140/90  Current regimen:  o Valsartan 160mg  daily  Interventions: o Requested patient to check 1-2 times per week o Coordinated with [facility] order to get refill of valsartan  Patient self care activities - Over the next 90 days, patient will: o Check BP 1-2 times per week, document, and provide at future appointments o Ensure daily salt intake < 2300 mg/Guynell Kleiber  Hyperlipidemia Lab Results  Component Value Date/Time   LDLCALC 106 (H) 12/16/2018 11:57 AM    Pharmacist Clinical Goal(s): o Over the next 90 days, patient will work with PharmD and providers to achieve LDL goal < 100 or prevent further increase in LDL  Current regimen:  o Diet and exercise management    Patient self care activities - Over the next 90 days, patient will: o Consider reducing cholesterol intake  Diabetes Lab Results  Component Value Date/Time   HGBA1C 6.8 (H) 11/29/2019 11:39 AM   HGBA1C 7.3 (H) 12/16/2018 11:57 AM    Pharmacist Clinical Goal(s): o Over the next 90 days, patient will work with PharmD and providers to maintain A1c goal <7%  Current regimen:  o Diet and exercise management    Interventions: o Discussed diet  Patient self care activities - Over the next 90 days, patient will: o Maintain a1c <7%  Health  Maintenance  Pharmacist Clinical Goal(s) o Over the next 180 days, patient will work with PharmD and providers to complete health maintenance screenings  Interventions: o Recommend patient to complete DEXA Scan o Discussed importance of consuming 1200mg  of calcium daily through diet and/or supplementation  Patient self care activities - Over the next 180 days, patient will: o Complete DEXA Scan o Assure consumption of 1200mg  of calcium daily  Rash  Pharmacist Clinical Goal(s) o Over the next 90 days, patient will work with PharmD and providers to reduce symptoms of rash  Current regimen:  o Triamcinolone 0.025% cream as needed  Interventions: o Collaboration with provider regarding disease management (derm/allergy referral?)  Patient self care activities - Over the next 90 days, patient will: o Maintain medication regimen for rash   Medication management  Pharmacist Clinical Goal(s): o Over the next 90 days, patient will work with PharmD and providers to maintain optimal medication adherence  Current pharmacy: [facility] Order  Interventions o Comprehensive medication review performed. o Continue current medication management strategy  Patient self care activities - Over the next 90 days, patient will: o Focus on medication adherence by filling and taking medications appropriately  o Take medications as prescribed o Report any questions or concerns to PharmD and/or provider(s)  Please see past updates related to this goal by clicking on the "Past Updates" button in the selected goal         The patient verbalized understanding of instructions provided today and agreed to receive a  mailed copy of patient instruction and/or educational materials.  Telephone follow up appointment with pharmacy team member scheduled for: 08/08/2020  Melvenia Beam Kristeena Meineke, PharmD Clinical Pharmacist [facility] [phone]

## 2020-06-07 NOTE — Chronic Care Management (AMB) (Signed)
Chronic Care Management Pharmacy  Name: Alexa [name]  MRN: [id number] DOB: Sep 11, 1929  Chief Complaint/ HPI  Alexa [name],  86 y.o. , female presents for their Follow-Up CCM visit with the clinical pharmacist via telephone due to COVID-19 Pandemic.  PCP : Darreld Mclean, MD  Their chronic conditions include: Diabetes, Hypertension, Hyperlipidemia, Asthma, Neuropathy/Pain, Depression, Allergic Rhinitis, Estrogen Deficiency, Rash  Office Visits: 05/31/20: Visit w/ Dr. Lorelei Pont - Fall on 05/27/20. Tripped over box, fell and landed on R ribs. Provider concern about patient and husband safety. Involved family members (Son, [name] and his wife Alexa [name]). Pt and husband refuse to leave home for assisted living. Stomach discomfort most likely due to Tylenol with codeine. Recommended miralax 1-2 doses per Briley Bumgarner until effect. Concern for left elbow. Referral to ortho (Later called pt to review results, normal gas pattern reassuring of no obstruction, pt did not get miralax, but may obtain later. Pt does have 3 broken ribs, refilled tylenol with codeine).  Consult Visit: 06/05/20: [facility] call w/ Reggie Pile, PA-C - Wound culture shows staph aureas susceptible to bactrim. Continue bactrim. Pt tolerating well.   06/01/20: Ortho visit w/ Reggie Pile, PA-C - Aspiration of left olecranon bursa. Start bactrim and mobic. RTC 1 week.  Medications: Outpatient Encounter Medications as of 06/07/2020  Medication Sig Note  . acetaminophen-codeine (TYLENOL #3) 300-30 MG tablet Take 1-2 tablets by mouth every 8 (eight) hours as needed for moderate pain.   . Ascorbic Acid (VITAMIN C) POWD Take 1 packet by mouth 2 (two) times daily as needed (cold symptoms).   Marland Kitchen aspirin EC 81 MG tablet Take 162 mg by mouth daily.   . Benzalkonium Chloride (DIABETIC BASICS HEALTHY FOOT EX) Apply 1 application topically daily as needed (foot pain). OTC diabetic foot cream   . Blood Glucose Monitoring Suppl (TRUERESULT BLOOD GLUCOSE)  W/DEVICE KIT Use to test blood sugar ICD 10 E11 9   . buPROPion (WELLBUTRIN) 100 MG tablet TAKE 1 TABLET(100 MG) BY MOUTH TWICE DAILY   . busPIRone (BUSPAR) 7.5 MG tablet Take 2 tablets (15 mg total) by mouth 2 (two) times daily. May take one 4x a Sunaina Ferrando if preferred   . CALCIUM PO Take 1 tablet by mouth daily. 01/03/2020: Unsure of dosage  . Cholecalciferol (VITAMIN D PO) Take 1 tablet by mouth daily. 2000 units   . CRANBERRY PO Take 2 capsules by mouth 2 (two) times daily. 42m   . ESTRACE VAGINAL 0.1 MG/GM vaginal cream Apply locally every other night at bedtime   . fluticasone (FLONASE) 50 MCG/ACT nasal spray INSTILL 2 SPRAYS INTO EACH NOSTRIL ONCE DAILY   . gabapentin (NEURONTIN) 100 MG capsule Take 1 capsule (100 mg total) by mouth 3 (three) times daily. Take one capsule by mouth three times daily.   .Marland Kitchenglucose blood (TRUETEST TEST) test strip Use to test blood sugar ICD 10 E11 9   . hydrOXYzine (ATARAX/VISTARIL) 10 MG tablet Take 1-2 tablets (10-20 mg total) by mouth 3 (three) times daily as needed. As needed for itching   . Multiple Vitamins-Minerals (EYE VITAMINS PO) Take by mouth.   . predniSONE (DELTASONE) 20 MG tablet Take 40 mg daily for 4 days, then 20 mg daily for 6 days, then 10 mg daily for 6 days   . triamcinolone (KENALOG) 0.025 % cream APPLY 1 APPLICATION TOPICALLY TWICE DAILY AS NEEDED FOR DRY SKIN ON BACK   . TRUEPLUS LANCETS 28G MISC Use to test blood sugar ICD 10 E11 9   .  valsartan (DIOVAN) 160 MG tablet Take 1 by mouth once daily for blood pressure   . [DISCONTINUED] triamcinolone (KENALOG) 0.025 % cream APPLY 1 APPLICATION TOPICALLY TWICE DAILY AS NEEDED FOR DRY SKIN ON BACK    No facility-administered encounter medications on file as of 06/07/2020.   SDOH Screenings   Alcohol Screen:   . Last Alcohol Screening Score (AUDIT): Not on file  Depression (PHQ2-9): Low Risk   . PHQ-2 Score: 1  Financial Resource Strain: Low Risk   . Difficulty of Paying Living Expenses: Not  hard at all  Food Insecurity: No Food Insecurity  . Worried About Charity fundraiser in the Last Year: Never true  . Ran Out of Food in the Last Year: Never true  Housing: Low Risk   . Last Housing Risk Score: 0  Physical Activity:   . Days of Exercise per Week: Not on file  . Minutes of Exercise per Session: Not on file  Social Connections:   . Frequency of Communication with Friends and Family: Not on file  . Frequency of Social Gatherings with Friends and Family: Not on file  . Attends Religious Services: Not on file  . Active Member of Clubs or Organizations: Not on file  . Attends Archivist Meetings: Not on file  . Marital Status: Not on file  Stress:   . Feeling of Stress : Not on file  Tobacco Use: Low Risk   . Smoking Tobacco Use: Never Smoker  . Smokeless Tobacco Use: Never Used  Transportation Needs: No Transportation Needs  . Lack of Transportation (Medical): No  . Lack of Transportation (Non-Medical): No     Current Diagnosis/Assessment:  Goals Addressed            This Visit's Progress   . Chronic Care Management Pharmacy Care Plan       CARE PLAN ENTRY (see longitudinal plan of care for additional care plan information)  Current Barriers:  . Chronic Disease Management support, education, and care coordination needs related to Diabetes, Hypertension, Hyperlipidemia, Asthma, Neuropathy/Pain, Depression, Allergic Rhinitis, Estrogen Deficiency, Rash   Hypertension BP Readings from Last 3 Encounters:  05/31/20 120/72  03/15/20 124/88  03/02/20 136/80   . Pharmacist Clinical Goal(s): o Over the next 90 days, patient will work with PharmD and providers to maintain BP goal <140/90 . Current regimen:  o Valsartan 123m daily . Interventions: o Requested patient to check 1-2 times per week o [facility] get [facility]ill of valsartan . Patient self care activities - Over the next 90 days, patient will: o Check BP 1-2 times per  week, document, and provide at future appointments o Ensure daily salt intake < 2300 mg/Reine Bristow  Hyperlipidemia Lab Results  Component Value Date/Time   LDLCALC 106 (H) 12/16/2018 11:57 AM   . Pharmacist Clinical Goal(s): o Over the next 90 days, patient will work with PharmD and providers to achieve LDL goal < 100 or prevent further increase in LDL . Current regimen:  o Diet and exercise management   . Patient self care activities - Over the next 90 days, patient will: o Consider reducing cholesterol intake  Diabetes Lab Results  Component Value Date/Time   HGBA1C 6.8 (H) 11/29/2019 11:39 AM   HGBA1C 7.3 (H) 12/16/2018 11:57 AM   . Pharmacist Clinical Goal(s): o Over the next 90 days, patient will work with PharmD and providers to maintain A1c goal <7% . Current regimen:  o Diet and exercise management   .  Interventions: o Discussed diet . Patient self care activities - Over the next 90 days, patient will: o Maintain a1c <7%  Health Maintenance . Pharmacist Clinical Goal(s) o Over the next 180 days, patient will work with PharmD and providers to complete health maintenance screenings . Interventions: o Recommend patient to complete DEXA Scan o Discussed importance of consuming 124m of calcium daily through diet and/or supplementation . Patient self care activities - Over the next 180 days, patient will: o Complete DEXA Scan o Assure consumption of 1205mof calcium daily  Rash . Pharmacist Clinical Goal(s) o Over the next 90 days, patient will work with PharmD and providers to reduce symptoms of rash . Current regimen:  o Triamcinolone 0.025% cream as needed . Interventions: o Collaboration with provider regarding disease management (derm/allergy referral?) . Patient self care activities - Over the next 90 days, patient will: o Maintain medication regimen for rash   Medication management . Pharmacist Clinical Goal(s): o Over the next 90 days, patient will work with  PharmD and providers to maintain optimal medication adherence . Current pharmacy: [facility] Interventions o Comprehensive medication review performed. o Continue current medication management strategy . Patient self care activities - Over the next 90 days, patient will: o Focus on medication adherence by filling and taking medications appropriately  o Take medications as prescribed o Report any questions or concerns to PharmD and/or provider(s)  Please see past updates related to this goal by clicking on the "Past Updates" button in the selected goal        Married 68 years.  Has 1 son living. Lost a daughter and grand daughter.  Has 3 great grandchildren. She cares for them after school and during the summer She has 2 dogs (poodle and maltese) and a cat.   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  Update 04/07/20 Moving back into her home Monday 9/13 and Tuesday 04/11/20  Diabetes   A1c goal <7%  Recent Relevant Labs: Lab Results  Component Value Date/Time   HGBA1C 6.8 (H) 11/29/2019 11:39 AM   HGBA1C 7.3 (H) 12/16/2018 11:57 AM   MICROALBUR 0.9 02/25/2014 12:20 PM   MICROALBUR 0.9 12/07/2007 10:10 AM   Checking BG: Rarely  Patient has failed these meds in past: None noted  Patient is currently controlled on the following medications:   None noted   States she has lost weight Going to eye doctor on Friday  Last diabetic Eye exam: No results found for: HMDIABEYEEXA  Last diabetic Foot exam: No results found for: HMDIABFOOTEX   We discussed: Importance of diet   Update 04/07/20 Has taken several courses of prednisone. Watchful of affect on a1c.  She states is eating more pasta, cookies, and ice cream since she has lost so much weight.  She is fearful of losing control of her diabetes, but she wants to gain weight. States she was given prescription vitamin [place]  Update 06/07/20 No longer taking prednisone.   Plan -Continue control with diet and exercise   Hypothyroidism   Lab Results  Component Value Date/Time   TSH 1.62 05/22/2020 02:18 PM   TSH 0.33 (A) 03/29/2020 12:00 AM   TSH 2.15 11/29/2019 11:39 AM   FREET4 0.79 04/09/2011 01:44 PM   FREET4 0.8 03/24/2009 10:24 AM    Patient has failed these meds in past: None noted  Patient is currently  controlled on the following medications:  . None  From 04/07/20 Visit She was told her thyroid was low and would need to have it evaluated by PCP.  Update 06/07/20 TSH WNL  Plan -Continue current management   Hypertension   Blood pressure goal <140/90   CMP Latest Ref Rng & Units 11/17/2019 08/25/2019 12/16/2018  Glucose 70 - 99 mg/dL 130(H) 156(H) 233(H)  BUN 8 - 23 mg/dL 22 23 19   Creatinine 0.44 - 1.00 mg/dL 0.97 0.94 0.98  Sodium 135 - 145 mmol/L 137 139 137  Potassium 3.5 - 5.1 mmol/L 4.3 4.2 4.5  Chloride 98 - 111 mmol/L 104 105 99  CO2 22 - 32 mmol/L 23 24 28   Calcium 8.9 - 10.3 mg/dL 9.0 9.2 9.2  Total Protein 6.5 - 8.1 g/dL 6.9 - 7.0  Total Bilirubin 0.3 - 1.2 mg/dL 0.9 - 0.7  Alkaline Phos 38 - 126 U/L 56 - 71  AST 15 - 41 U/L 20 - 15  ALT 0 - 44 U/L 17 - 15   Kidney Function Lab Results  Component Value Date/Time   CREATININE 0.97 11/17/2019 04:30 PM   CREATININE 0.94 08/25/2019 01:40 PM   CREATININE 1.24 (H) 04/03/2018 03:52 PM   CREATININE 1.08 05/07/2013 05:05 PM   GFR 53.67 (L) 12/16/2018 11:57 AM   GFRNONAA 53 (L) 11/17/2019 04:30 PM   GFRAA >60 11/17/2019 04:30 PM   Office blood pressures are  BP Readings from Last 3 Encounters:  05/31/20 120/72  03/15/20 124/88  03/02/20 136/80   Patient has failed these meds in the past: lisinopril (cough) Patient is currently controlled on the following medications:   Valsartan 19m daily (increased at 11/29/19 [place] visit)  Patient checks BP at home 1-2x per week  Patient home BP readings  are ranging: 145-150s  She felt dizzy when she fell last week.  Gets a headache when her blood pressure is elevated.  She tries to keep moving. Works mostly in her garden  Cut salt out of her diet due to her mother having a history of stroke.  We discussed Importance of blood pressure control   Update 04/07/20 States her BP is "low"  Update 06/07/20 Did you find BP machine? Yes, "my blood pressure is fine, honey. I know it is"  Can't report any specific readings (pt has not been checking BP). States her BP is elevated when her head starts to hurt and her head has not been hurting. States she hasn't received her BP medication. States she is using her old pills (unable to verify which dose this is) Called pharmacy to verify last fill date of Valsartan 1611m  Pharmacy reports medication was shipped 05/10/20. Inquired of how to get patient refill as patient states she does not have this medication at home and has been taking her lower dosage of the medication. Felicia, C[facility] and will complete an over ride to send patient another refill    Plan -Continue current medications  -Continue checking blood pressure 1-2 times per week and record   Neuropathy/Pain    Patient has failed these meds in past: tramadol (efficacy) Patient is currently controlled on the following medications:   Gabapentin 10092mp to three times daily (takes 1-2 daily)  Tylenol #3 1-2 tabs every 8 hours as needed  Pain Dr. RamNelva Bush KnoBayard Malesrescribes hydrocodone for back pain States she has taken hydrocodone for about 2 years.  She wonders if the hydrocodone could be the cause of her  rash.  She stopped taking hydrocodone, and the rash did improve, but she is still itching.  She is now taking tylenol for arthritis every 6 hours.  Neuropathy Feels it is getting better  We discussed:  The possibility of hydrocodone causing her rash   Update 04/07/20 Being seen by Dr. Boyd Kerbs. He checked the nerves in her leg.  She really enjoys his care  Update 06/07/20 States she is still using current regimen and it's working for her.  Plan -Continue current medications  Rash    Patient has failed these meds in past: None noted  Patient is currently stable on the following medications: Triamcinolone 0.025% cream as needed  Has had a rash for over a year now and doesn't know what the cause is.  States she went to a dermatologist and was told to continue the cream she is currently prescribed. She states she is unsure of the diagnosis.  Interested in a referral to a new dermatologist to determine the cause of her rash. She feels it is more than an eczema type rash. Does report rough patches on her skin. Bumps and sores on her neck. "It's tormenting me".  She wonders which medications could be causing her to itch  We discussed:  The possibility of hydrocodone or gabapentin being the cause of her rash and the possibility for a derm or allergy referral   Update 04/07/20 Started a new round of prednisone yesterday.  She states her rash is doing better now.  She is hoping she will be able to continue on prednisone.  She is hoping when she moves into her home, her nerves will be better.  She states she took the hydroxyzine for a short period, but hasn't taken since she started new trial of prednisone.  Update 06/07/20 Still has rash. Hasn't gotten worse or better.   Plan -Continue current medications   Medication Management   Pt uses [facility] for all medications Uses pill box? No - She feels like she keeps up with her medications Pt endorses 100% compliance  We discussed: At last visit it appeared patient used [facility], but now that she is getting most of her medications through Mail Order,  Current pharmacy is preferred with insurance plan and patient is satisfied with pharmacy services   If Mail order becomes  barrier, patient may benefit from filling with [facility]  Plan  Continue current medication management strategy   Follow up: 1 month BP recheck 2 month phone visit  De Blanch, PharmD Clinical Pharmacist [facility] [phone]

## 2020-06-08 DIAGNOSIS — M71122 Other infective bursitis, left elbow: Secondary | ICD-10-CM | POA: Diagnosis not present

## 2020-06-14 DIAGNOSIS — M5416 Radiculopathy, lumbar region: Secondary | ICD-10-CM | POA: Diagnosis not present

## 2020-06-14 DIAGNOSIS — K589 Irritable bowel syndrome without diarrhea: Secondary | ICD-10-CM | POA: Diagnosis not present

## 2020-06-14 DIAGNOSIS — F32A Depression, unspecified: Secondary | ICD-10-CM | POA: Diagnosis not present

## 2020-06-14 DIAGNOSIS — M4802 Spinal stenosis, cervical region: Secondary | ICD-10-CM | POA: Diagnosis not present

## 2020-06-14 DIAGNOSIS — E1140 Type 2 diabetes mellitus with diabetic neuropathy, unspecified: Secondary | ICD-10-CM | POA: Diagnosis not present

## 2020-06-14 DIAGNOSIS — K5730 Diverticulosis of large intestine without perforation or abscess without bleeding: Secondary | ICD-10-CM | POA: Diagnosis not present

## 2020-06-14 DIAGNOSIS — E785 Hyperlipidemia, unspecified: Secondary | ICD-10-CM | POA: Diagnosis not present

## 2020-06-14 DIAGNOSIS — J449 Chronic obstructive pulmonary disease, unspecified: Secondary | ICD-10-CM | POA: Diagnosis not present

## 2020-06-14 DIAGNOSIS — I10 Essential (primary) hypertension: Secondary | ICD-10-CM | POA: Diagnosis not present

## 2020-06-15 DIAGNOSIS — R6889 Other general symptoms and signs: Secondary | ICD-10-CM | POA: Diagnosis not present

## 2020-06-15 DIAGNOSIS — M7022 Olecranon bursitis, left elbow: Secondary | ICD-10-CM | POA: Diagnosis not present

## 2020-06-21 NOTE — Progress Notes (Signed)
I have collaborated with the care management provider regarding care management and care coordination activities outlined in this encounter and have reviewed this encounter including documentation in the note and care plan. I am certifying that I agree with the content of this note and encounter as supervising physician. J Raye Slyter md

## 2020-06-26 ENCOUNTER — Other Ambulatory Visit: Payer: Self-pay

## 2020-06-26 ENCOUNTER — Telehealth: Payer: Self-pay | Admitting: Family Medicine

## 2020-06-26 ENCOUNTER — Emergency Department (HOSPITAL_COMMUNITY)
Admission: EM | Admit: 2020-06-26 | Discharge: 2020-06-26 | Disposition: A | Discharge status: Home / Self Care | Payer: Medicare HMO | Attending: Emergency Medicine | Admitting: Emergency Medicine

## 2020-06-26 ENCOUNTER — Encounter (HOSPITAL_COMMUNITY): Payer: Self-pay

## 2020-06-26 DIAGNOSIS — I10 Essential (primary) hypertension: Secondary | ICD-10-CM | POA: Insufficient documentation

## 2020-06-26 DIAGNOSIS — Z7982 Long term (current) use of aspirin: Secondary | ICD-10-CM | POA: Insufficient documentation

## 2020-06-26 DIAGNOSIS — Z85528 Personal history of other malignant neoplasm of kidney: Secondary | ICD-10-CM | POA: Diagnosis not present

## 2020-06-26 DIAGNOSIS — E119 Type 2 diabetes mellitus without complications: Secondary | ICD-10-CM | POA: Insufficient documentation

## 2020-06-26 DIAGNOSIS — Z7984 Long term (current) use of oral hypoglycemic drugs: Secondary | ICD-10-CM | POA: Diagnosis not present

## 2020-06-26 DIAGNOSIS — R21 Rash and other nonspecific skin eruption: Secondary | ICD-10-CM | POA: Diagnosis present

## 2020-06-26 DIAGNOSIS — Z7951 Long term (current) use of inhaled steroids: Secondary | ICD-10-CM | POA: Diagnosis not present

## 2020-06-26 DIAGNOSIS — J449 Chronic obstructive pulmonary disease, unspecified: Secondary | ICD-10-CM | POA: Diagnosis not present

## 2020-06-26 DIAGNOSIS — L309 Dermatitis, unspecified: Secondary | ICD-10-CM | POA: Diagnosis not present

## 2020-06-26 DIAGNOSIS — Z79899 Other long term (current) drug therapy: Secondary | ICD-10-CM | POA: Insufficient documentation

## 2020-06-26 LAB — CBC
HCT: 42.5 % (ref 36.0–46.0)
Hemoglobin: 13.7 g/dL (ref 12.0–15.0)
MCH: 29 pg (ref 26.0–34.0)
MCHC: 32.2 g/dL (ref 30.0–36.0)
MCV: 90 fL (ref 80.0–100.0)
Platelets: 320 10*3/uL (ref 150–400)
RBC: 4.72 MIL/uL (ref 3.87–5.11)
RDW: 14 % (ref 11.5–15.5)
WBC: 8.8 10*3/uL (ref 4.0–10.5)
nRBC: 0 % (ref 0.0–0.2)

## 2020-06-26 LAB — BASIC METABOLIC PANEL
Anion gap: 10 (ref 5–15)
BUN: 20 mg/dL (ref 8–23)
CO2: 24 mmol/L (ref 22–32)
Calcium: 8.9 mg/dL (ref 8.9–10.3)
Chloride: 106 mmol/L (ref 98–111)
Creatinine, Ser: 0.87 mg/dL (ref 0.44–1.00)
GFR, Estimated: >60 mL/min (ref >60)
Glucose, Bld: 135 mg/dL — ABNORMAL HIGH (ref 70–99)
Potassium: 4.4 mmol/L (ref 3.5–5.1)
Sodium: 140 mmol/L (ref 135–145)

## 2020-06-26 MED ORDER — LORATADINE 10 MG PO TABS: 10.0000 mg | ORAL_TABLET | Freq: Every day | ORAL | 0 refills | Status: DC

## 2020-06-26 MED ORDER — PREDNISONE 10 MG (21) PO TBPK: ORAL_TABLET | ORAL | 0 refills | Status: DC

## 2020-06-26 MED ORDER — DIPHENHYDRAMINE HCL 50 MG/ML IJ SOLN
12.5000 mg | Freq: Once | INTRAMUSCULAR | Status: AC
  Administered 2020-06-26: 12.5 mg via INTRAVENOUS
  Filled 2020-06-26: qty 1

## 2020-06-26 MED ORDER — PREDNISONE 20 MG PO TABS
40.0000 mg | ORAL_TABLET | Freq: Once | ORAL | Status: AC
  Administered 2020-06-26: 40 mg via ORAL
  Filled 2020-06-26: qty 2

## 2020-06-26 NOTE — ED Triage Notes (Addendum)
Patient c/o left facial redness and swelling and left arm swelling.  Patient states she has had facial redness and swelling since taking a shingles shot last year. patient has been using a cream given to her, but is much worse. Decreased vision due to eye swelling.  Patient states she had an abscess x 2 to the left forearm. Patient had the abscesses drained 3 weeks and had resolved. Patient now has redness and swelling that began last night.

## 2020-06-26 NOTE — ED Notes (Signed)
Pt ambulatory to restroom. Pt provided a UA. UA is at the bedside awaiting order.

## 2020-06-26 NOTE — Discharge Instructions (Addendum)
Take the medications as prescribed.  Follow-up with your primary care doctor to be rechecked.  Stop taking the bactrim antibiotic in case that is triggering your rash.

## 2020-06-26 NOTE — Telephone Encounter (Signed)
Patient states she experiencing some swollen along with a rash on her left elbow. Her eyes are also swollen. Patient insist to see Dr. Lorelei Pont. Dr. Lorelei Pont does not have any available appt. Patient states she would like a phone call

## 2020-06-26 NOTE — ED Provider Notes (Addendum)
[name]   CSN: [id number] Arrival date & time: 06/26/20  1226     History Chief Complaint  Patient presents with  . Facial Swelling  . Arm Swelling    Alexa [name] is a 86 y.o. female.  HPI   Patient presents to the ED for evaluation of a diffuse rash that involves her face as well as her left arm and to a lesser extent her right arm.  Patient states she was diagnosed with an infection in her elbow.  According to medical records she was diagnosed with olecranon bursitis in the beginning of this month.  This occurred after she fell injuring her elbow.  Patient ended up seeing Dr. Berdine Addison orthopedics, and was treated with aspiration as well as antibiotics according to the patient.  According to the medical records the patient was started on Bactrim.  Patient was given a refill of her Bactrim on November 18 according to the medical records.  Patient states she also has been applying a cream to her forehead.  She thinks that could be contributing to her symptoms.  Patient denies any fevers or chills.  She is concerned because she started noticing more swelling in her left arm and was worried that the infection was getting worse.  The facial rash involves her forehead and eyes and she finds that it is making it harder for her to open her eyes.  She did notice that her right forearm was also itching her last evening and she was having to itch that as well.  Patient states she has had similar episodes like this in the past.  She was treated with steroids.  She also followed up with a dermatologist at one point. Past Medical History:  Diagnosis Date  . Asthma   . Chronic obstructive pulmonary disease ([place])   . Diverticulosis   . Diverticulosis of colon (without mention of hemorrhage)   . Esophageal reflux   . History of colonic polyps 06/05/05   hyperplastic  . Internal hemorrhoids   . Irritable bowel syndrome   . Ischemic colitis ([place])   .  Malignant neoplasm of kidney and other and unspecified urinary organs    renal carcinoma, left nephrectomy in 2008-per patient.  . Other and unspecified hyperlipidemia   . Personal history of venous thrombosis and embolism    PTE after nephrectomy  . Spinal stenosis   . Type II or unspecified type diabetes mellitus without mention of complication, not stated as uncontrolled 02/05/2012   pt states it was in 2007  . Unspecified essential hypertension     Patient Active Problem List   Diagnosis Date Noted  . Solitary kidney 12/15/2018  . Cough variant asthma 12/29/2014  . Spinal stenosis in cervical region 12/13/2014  . Radiculopathy, lumbar region 12/13/2014  . Depression 05/03/2008  . PERONEAL NEUROPATHY 05/03/2008  . Malignant neoplasm of kidney excluding renal pelvis ([place]) 08/06/2007  . Controlled type 2 diabetes mellitus without complication, without long-term current use of insulin ([place]) 08/06/2007  . GERD 08/06/2007  . PULMONARY EMBOLISM, HX OF 08/06/2007  . Hyperlipidemia 09/03/2006  . Essential hypertension 09/03/2006  . IBS 09/03/2006    Past Surgical History:  Procedure Laterality Date  . BREAST BIOPSY    . COLONOSCOPY W/ POLYPECTOMY  2000  . COLONOSCOPY W/ POLYPECTOMY  07/2004   Hyperplastic polyps  . DILATION AND CURETTAGE OF UTERUS  03/2005   Uterine Polyps  . NEPHRECTOMY  2007   right,Dr Dahlstedt  .  SEPTOPLASTY    . TONSILLECTOMY       OB History   No obstetric history on file.     Family History  Problem Relation Age of Onset  . Stroke Mother        onset in 76s  . Diabetes Mother   . Cancer Mother        bladder; nephrectomy for calculi/also uterine , TAH & BSO  . Aneurysm Mother        thoracic  . Depression Mother   . Stroke Brother   . Heart failure Brother   . Heart attack Other        maternal family history  . Heart failure Maternal Grandfather   . Lung cancer Father        smoked  . Heart failure Sister   . Depression Sister   .  Alzheimer's disease Sister   . COPD Sister   . Aneurysm Brother        cns  . Depression Maternal Aunt        X2  . Bipolar disorder Sister   . Alcoholism Neg Hx     Social History   Tobacco Use  . Smoking status: Never Smoker  . Smokeless tobacco: Never Used  Vaping Use  . Vaping Use: Never used  Substance Use Topics  . Alcohol use: No  . Drug use: No    Home Medications Prior to Admission medications   Medication Sig Start Date End Date Taking? Authorizing Provider  acetaminophen-codeine (TYLENOL #3) 300-30 MG tablet Take 1-2 tablets by mouth every 8 (eight) hours as needed for moderate pain. 05/31/20   Copland, Gay Filler, MD  Ascorbic Acid (VITAMIN C) POWD Take 1 packet by mouth 2 (two) times daily as needed (cold symptoms).    [provider]  aspirin EC 81 MG tablet Take 162 mg by mouth daily.    [provider]  Benzalkonium Chloride (DIABETIC BASICS HEALTHY FOOT EX) Apply 1 application topically daily as needed (foot pain). OTC diabetic foot cream    [provider]  Blood Glucose Monitoring Suppl (TRUERESULT BLOOD GLUCOSE) W/DEVICE KIT Use to test blood sugar ICD 10 E11 9 07/14/14   Hendricks Limes, MD  buPROPion (WELLBUTRIN) 100 MG tablet TAKE 1 TABLET(100 MG) BY MOUTH TWICE DAILY 04/28/20   Copland, Gay Filler, MD  busPIRone (BUSPAR) 7.5 MG tablet Take 2 tablets (15 mg total) by mouth 2 (two) times daily. May take one 4x a day if preferred 04/28/20   Copland, Gay Filler, MD  CALCIUM PO Take 1 tablet by mouth daily.    [provider]  Cholecalciferol (VITAMIN D PO) Take 1 tablet by mouth daily. 2000 units    [provider]  CRANBERRY PO Take 2 capsules by mouth 2 (two) times daily. 56m    [provider]  ESTRACE VAGINAL 0.1 MG/GM vaginal cream Apply locally every other night at bedtime 01/16/16   [provider]  fluticasone (FLONASE) 50 MCG/ACT nasal spray INSTILL 2 SPRAYS INTO EACH NOSTRIL ONCE DAILY 04/28/20    Copland, JGay Filler MD  gabapentin (NEURONTIN) 100 MG capsule Take 1 capsule (100 mg total) by mouth 3 (three) times daily. Take one capsule by mouth three times daily. 04/28/20   Copland, JGay Filler MD  glucose blood (TRUETEST TEST) test strip Use to test blood sugar ICD 10 E11 9 07/14/14   HHendricks Limes MD  hydrOXYzine (ATARAX/VISTARIL) 10 MG tablet Take 1-2 tablets (10-20 mg  total) by mouth 3 (three) times daily as needed. As needed for itching 03/02/20   Copland, Gay Filler, MD  loratadine (CLARITIN) 10 MG tablet Take 1 tablet (10 mg total) by mouth daily. 06/26/20   Dorie Rank, MD  Multiple Vitamins-Minerals (EYE VITAMINS PO) Take by mouth.    [provider]  predniSONE (STERAPRED UNI-PAK 21 TAB) 10 MG (21) TBPK tablet Take 5 tablets (50 mg total) by mouth daily for 2 days, THEN 4 tablets (40 mg total) daily for 2 days, THEN 3 tablets (30 mg total) daily for 2 days, THEN 2 tablets (20 mg total) daily for 2 days, THEN 1 tablet (10 mg total) daily for 2 days. 06/26/20 07/06/20  Dorie Rank, MD  triamcinolone (KENALOG) 0.025 % cream APPLY 1 APPLICATION TOPICALLY TWICE DAILY AS NEEDED FOR DRY SKIN ON BACK 04/28/20   Copland, Gay Filler, MD  TRUEPLUS LANCETS 28G MISC Use to test blood sugar ICD 10 E11 9 07/14/14   Hendricks Limes, MD  valsartan (DIOVAN) 160 MG tablet Take 1 by mouth once daily for blood pressure 04/28/20   Copland, Gay Filler, MD  Vitamin D, Ergocalciferol, (DRISDOL) 1.25 MG (50000 UNIT) CAPS capsule Take 50,000 Units by mouth once a week. 05/31/20   [provider]  triamcinolone (KENALOG) 0.025 % cream APPLY 1 APPLICATION TOPICALLY TWICE DAILY AS NEEDED FOR DRY SKIN ON BACK 12/15/19   Copland, Gay Filler, MD    Allergies    Lisinopril, Codeine, and Verapamil  Review of Systems   Review of Systems  All other systems reviewed and are negative.   Physical Exam Updated Vital Signs BP 140/78   Pulse 88   Temp (!) 97.4 F (36.3 C) (Oral)   Resp 20   Ht 1.575 m  (5' 2" )   Wt 54.4 kg   SpO2 100%   BMI 21.95 kg/m   Physical Exam Vitals and nursing note reviewed.  Constitutional:      Appearance: She is well-developed. She is not ill-appearing or diaphoretic.  HENT:     Head: Normocephalic and atraumatic.     Right Ear: External ear normal.     Left Ear: External ear normal.  Eyes:     General: No scleral icterus.       Right eye: No discharge.        Left eye: No discharge.     Conjunctiva/sclera: Conjunctivae normal.  Neck:     Trachea: No tracheal deviation.  Cardiovascular:     Rate and Rhythm: Normal rate and regular rhythm.  Pulmonary:     Effort: Pulmonary effort is normal. No respiratory distress.     Breath sounds: Normal breath sounds. No stridor. No wheezing or rales.  Abdominal:     General: Bowel sounds are normal. There is no distension.     Palpations: Abdomen is soft.     Tenderness: There is no abdominal tenderness. There is no guarding or rebound.  Musculoskeletal:        General: No tenderness.     Cervical back: Neck supple.  Skin:    General: Skin is warm and dry.     Findings: Rash present.     Comments: Erythematous rash that involves the patient's forehead and periorbital region, the skin is dry, no purulent drainage, no serous drainage patient also has erythematous rash involving bilateral forearms, no increased warmth, no drainage  Neurological:     Mental Status: She is alert.     Cranial Nerves: No cranial  nerve deficit (no facial droop, extraocular movements intact, no slurred speech).     Sensory: No sensory deficit.     Motor: No abnormal muscle tone or seizure activity.     Coordination: Coordination normal.     ED Results / Procedures / Treatments   Labs (all labs ordered are listed, but only abnormal results are displayed) Labs Reviewed  BASIC METABOLIC PANEL - Abnormal; Notable for the following components:      Result Value   Glucose, Bld 135 (*)    All other components within normal  limits  CBC    EKG None  Radiology No results found.  Procedures Procedures (including critical care time)  Medications Ordered in ED Medications  predniSONE (DELTASONE) tablet 40 mg (40 mg Oral Given 06/26/20 1441)  diphenhydrAMINE (BENADRYL) injection 12.5 mg (12.5 mg Intravenous Given 06/26/20 1441)    ED Course  I have reviewed the triage vital signs and the nursing notes.  Pertinent labs & imaging results that were available during my care of the patient were reviewed by me and considered in my medical decision making (see chart for details).  Clinical Course as of Jun 26 1552  Mon Jun 26, 2020  3291 CBC and metabolic panel are normal   [JK]    Clinical Course User Index [JK] Dorie Rank, MD   MDM Rules/Calculators/A&P                         Rash appears to be more consistent with either a contact dermatitis or more likely drug reaction.  No findings to suggest abscess or pustules.  I doubt vasculitis.  I doubt that her arm swelling is related to infection as this appears to be more related to her rash and inflammation.  The rash does involve both forearms as well as her forehead and face.  Will provide a dose of antihistamines as well as steroids.  I will double check her blood count and electrolytes as the patient was concerned about her possibly being anemic  Laboratory tests are unremarkable.  Patient's findings are suggestive of a dermatitis.  Could be related to her Bactrim.  I will have her discontinue the Bactrim.  I will start the patient on steroids and antihistamines.  Outpatient follow-up with her primary doctor Final Clinical Impression(s) / ED Diagnoses Final diagnoses:  Dermatitis    Rx / DC Orders ED Discharge Orders         Ordered    predniSONE (STERAPRED UNI-PAK 21 TAB) 10 MG (21) TBPK tablet        06/26/20 1551    loratadine (CLARITIN) 10 MG tablet  Daily        06/26/20 1551           Dorie Rank, MD 06/26/20 1553    Dorie Rank,  MD 06/26/20 1554

## 2020-06-26 NOTE — Telephone Encounter (Signed)
Patient evaluated/treated in ER.   [facility] Night - Client TELEPHONE ADVICE RECORD [facility] Patient Name: Alexa [name] Gender: Female DOB: 1929/10/26 Age: 85 Y 75 M 5 D Return Phone Number: [phone] (Primary) Address: City/State/Zip: [place] [address] Client [facility] Night - Client Client Site [facility] - Night Physician Copland, Janett Billow- MD Contact Type Call Who Is Calling Patient / Member / Family / Caregiver Call Type Triage / Clinical Relationship To Patient Self Return Phone Number [phone] (Primary) Chief Complaint Itching Reason for Call Symptomatic / Request for Health Information Initial Comment Caller is itching all over and eyes are swollen. Translation No Nurse Assessment Nurse: D'Heur Lucia Gaskins, RN, Adrienne Date/Time (Eastern Time): 06/25/2020 3:56:16 PM Confirm and document reason for call. If symptomatic, describe symptoms. ---Caller is itching all over and eyes are swollen for past two days. She states she's had the itching for over a year. They do not know why she's had the itching. Caller states she can hardly see. Does the patient have any new or worsening symptoms? ---Yes Will a triage be completed? ---Yes Related visit to physician within the last 2 weeks? ---No Does the PT have any chronic conditions? (i.e. diabetes, asthma, this includes High risk factors for pregnancy, etc.) ---Yes List chronic conditions. ---HTN Is this a behavioral health or substance abuse call? ---No Guidelines Guideline Title Affirmed Question Affirmed Notes Nurse Date/Time Eilene Ghazi Time) Eye - Swelling [1] SEVERE eyelid swelling (i.e., shut or almost) AND [2] involves both eyes (Exception: itchy eyes, which are probably an allergic reaction) [place], RN, Adrienne 06/25/2020 3:59:12 PM Disp. Time Eilene Ghazi Time) Disposition Final User 06/25/2020 4:09:06 PM See HCP within 4 Hours (or  PCP triage) Yes D'Heur Lucia Gaskins, RN, Adrienne PLEASE NOTE: All timestamps contained within this report are represented as [place] Standard Time. CONFIDENTIALTY NOTICE: This fax transmission is intended only for the addressee. It contains information that is legally privileged, confidential or otherwise protected from use or disclosure. If you are not the intended recipient, you are strictly prohibited from reviewing, disclosing, copying using or disseminating any of this information or taking any action in reliance on or regarding this information. If you have received this fax in error, please notify us immediately by telephone so that we can arrange for its return to [place]. Phone: [phone], Toll-Free: [phone], Fax: [phone] Page: 2 of 2 Call Id: [id number] [place] Disagree/Comply Disagree Caller Understands Yes PreDisposition Call Doctor Care Advice Given Per Guideline SEE HCP (OR PCP TRIAGE) WITHIN 4 HOURS: * IF OFFICE WILL BE OPEN: You need to be seen within the next 3 or 4 hours. Call your doctor (or NP/PA) now or as soon as the office opens. CARE ADVICE given per Eye - Swelling (Adult) guideline. Comments User: Vincente Liberty, D'Heur Lucia Gaskins, RN Date/Time Eilene Ghazi Time): 06/25/2020 4:00:03 PM Broken out on the neck, head, and her face. User: Vincente Liberty, D'Heur Lucia Gaskins, RN Date/Time Eilene Ghazi Time): 06/25/2020 4:01:29 PM Caller states her eyes are almost swollen shut. User: Vincente Liberty, D'Heur Lucia Gaskins, RN Date/Time Eilene Ghazi Time): 06/25/2020 4:12:08 PM Advised patient that meds are not called in after hours & there are no medications on thte Standing Orders list to address the severe swelling of her eyes. Caller attempted several times to have this nurse call something in & I reiterated that meds were not called in after hours except for standing orders. Patient disconnected mid-call. Referrals GO TO FACILITY REFUSE

## 2020-06-27 DIAGNOSIS — F32A Depression, unspecified: Secondary | ICD-10-CM | POA: Diagnosis not present

## 2020-06-27 DIAGNOSIS — K5730 Diverticulosis of large intestine without perforation or abscess without bleeding: Secondary | ICD-10-CM | POA: Diagnosis not present

## 2020-06-27 DIAGNOSIS — E1140 Type 2 diabetes mellitus with diabetic neuropathy, unspecified: Secondary | ICD-10-CM | POA: Diagnosis not present

## 2020-06-27 DIAGNOSIS — J449 Chronic obstructive pulmonary disease, unspecified: Secondary | ICD-10-CM | POA: Diagnosis not present

## 2020-06-27 DIAGNOSIS — E785 Hyperlipidemia, unspecified: Secondary | ICD-10-CM | POA: Diagnosis not present

## 2020-06-27 DIAGNOSIS — K589 Irritable bowel syndrome without diarrhea: Secondary | ICD-10-CM | POA: Diagnosis not present

## 2020-06-27 DIAGNOSIS — I10 Essential (primary) hypertension: Secondary | ICD-10-CM | POA: Diagnosis not present

## 2020-06-27 DIAGNOSIS — M4802 Spinal stenosis, cervical region: Secondary | ICD-10-CM | POA: Diagnosis not present

## 2020-06-27 DIAGNOSIS — M5416 Radiculopathy, lumbar region: Secondary | ICD-10-CM | POA: Diagnosis not present

## 2020-07-05 ENCOUNTER — Telehealth: Payer: Self-pay | Admitting: Family Medicine

## 2020-07-05 MED ORDER — PREDNISONE 5 MG PO TABS: ORAL_TABLET | ORAL | 0 refills | Status: DC

## 2020-07-05 NOTE — Telephone Encounter (Signed)
Patient was seen in the emergency room on 11/29, given a steroid taper pack for dermatitis.  As she has recently been on a higher dose steroid, I will give her 5 mg for 10 days in hopes of keeping her symptoms at Alexa [name]

## 2020-07-05 NOTE — Telephone Encounter (Signed)
Patient states she would like a medication sent to pharmacy. Patient states she is itching.

## 2020-07-05 NOTE — Telephone Encounter (Signed)
Patient states she is itching all over the place. Patient refuse appointment. Patient states she wants prednisone to be send to pharmacy today.

## 2020-07-07 ENCOUNTER — Telehealth: Payer: Self-pay

## 2020-07-07 MED ORDER — PREDNISONE 10 MG PO TABS: 10.0000 mg | ORAL_TABLET | Freq: Every day | ORAL | 0 refills | Status: DC

## 2020-07-07 NOTE — Addendum Note (Signed)
Addended by: Lamar Blinks C on: 07/07/2020 03:13 PM   Modules accepted: Orders

## 2020-07-07 NOTE — Telephone Encounter (Signed)
Called pt as I am concerned about her persistent use of steroids.  No answer at either number and VM is full so I cannot LM She does not use her mychart I sent in 10 mg pred for 5 days, instructions to then continue with 5 mg to finish taper

## 2020-07-07 NOTE — Telephone Encounter (Signed)
Patient states she is still itching and she would like a  New medication sent to pharmacy, patient states the lower dose of prednisone 5mg  is not working.

## 2020-07-07 NOTE — Telephone Encounter (Signed)
Nurse Assessment Nurse: Jonny Ruiz, RN, Vaughan Basta Date/Time (Eastern Time): 07/06/2020 11:07:25 PM Confirm and document reason for call. If symptomatic, describe symptoms. ---Caller states she is itching all over onset 2 days ago and started prednisone yesterday. Pt also reports facial burning and eye swelling. Does the patient have any new or worsening symptoms? ---Yes Will a triage be completed? ---Yes Related visit to physician within the last 2 weeks? ---No Does the PT have any chronic conditions? (i.e. diabetes, asthma, this includes High risk factors for pregnancy, etc.) ---Yes List chronic conditions. ---Asthma, COPD, HTN, one kidney Is this a behavioral health or substance abuse call? ---No Guidelines Guideline Title Affirmed Question Affirmed Notes Nurse Date/Time (Eastern Time) Rash or Redness - Widespread Face becomes swollen Jonny Ruiz, RN, Vaughan Basta 07/06/2020 11:09:45 PM Disp. Time Eilene Ghazi Time) Disposition Final User 07/06/2020 11:19:17 PM See HCP within 4 Hours (or PCP triage) Yes Jonny Ruiz, RN, Phineas Semen Disagree/Comply Disagree Caller Understands Yes PLEASE NOTE: All timestamps contained within this report are represented as [place] Standard Time. CONFIDENTIALTY NOTICE: This fax transmission is intended only for the addressee. It contains information that is legally privileged, confidential or otherwise protected from use or disclosure. If you are not the intended recipient, you are strictly prohibited from reviewing, disclosing, copying using or disseminating any of this information or taking any action in reliance on or regarding this information. If you have received this fax in error, please notify us immediately by telephone so that we can arrange for its return to [place]. Phone: [phone], Toll-Free: [phone], Fax: [phone] Page: 2 of 2 Call Id: [id number] PreDisposition InappropriateToAsk Care Advice Given Per Guideline SEE HCP (OR PCP TRIAGE) WITHIN 4 HOURS:  CARE ADVICE given per Rash - Widespread and Cause Unknown (Adult) guideline. Comments User: Charlott Holler, RN Date/Time Eilene Ghazi Time): 07/06/2020 11:19:05 PM When advised of See HCP within 4 hour outcome pt states she is not going in to be seen and is going to call pharmacy to see what previous medication dose was. Patient instructed to only take prednisone as prescribed. Caller hung up on this nurse. Referrals GO TO FACILITY REFUSED  PCP and CMA aware of rash.

## 2020-07-07 NOTE — Telephone Encounter (Signed)
Patient states 5mg  is not working. Patient states she is itching real bad, eye swelling and would like 10 mg instead please advise

## 2020-07-10 DIAGNOSIS — E785 Hyperlipidemia, unspecified: Secondary | ICD-10-CM | POA: Diagnosis not present

## 2020-07-10 DIAGNOSIS — K589 Irritable bowel syndrome without diarrhea: Secondary | ICD-10-CM | POA: Diagnosis not present

## 2020-07-10 DIAGNOSIS — F32A Depression, unspecified: Secondary | ICD-10-CM | POA: Diagnosis not present

## 2020-07-10 DIAGNOSIS — M5416 Radiculopathy, lumbar region: Secondary | ICD-10-CM | POA: Diagnosis not present

## 2020-07-10 DIAGNOSIS — M4802 Spinal stenosis, cervical region: Secondary | ICD-10-CM | POA: Diagnosis not present

## 2020-07-10 DIAGNOSIS — I10 Essential (primary) hypertension: Secondary | ICD-10-CM | POA: Diagnosis not present

## 2020-07-10 DIAGNOSIS — J449 Chronic obstructive pulmonary disease, unspecified: Secondary | ICD-10-CM | POA: Diagnosis not present

## 2020-07-10 DIAGNOSIS — K5730 Diverticulosis of large intestine without perforation or abscess without bleeding: Secondary | ICD-10-CM | POA: Diagnosis not present

## 2020-07-10 DIAGNOSIS — E1140 Type 2 diabetes mellitus with diabetic neuropathy, unspecified: Secondary | ICD-10-CM | POA: Diagnosis not present

## 2020-07-11 ENCOUNTER — Other Ambulatory Visit: Payer: Self-pay | Admitting: Family Medicine

## 2020-07-11 DIAGNOSIS — L299 Pruritus, unspecified: Secondary | ICD-10-CM

## 2020-07-12 ENCOUNTER — Other Ambulatory Visit: Payer: Self-pay

## 2020-07-12 ENCOUNTER — Ambulatory Visit (INDEPENDENT_AMBULATORY_CARE_PROVIDER_SITE_OTHER): Payer: Medicare HMO | Admitting: Internal Medicine

## 2020-07-12 VITALS — BP 152/90 | HR 102 | Temp 97.9°F | Ht 62.0 in | Wt 124.0 lb

## 2020-07-12 DIAGNOSIS — L309 Dermatitis, unspecified: Secondary | ICD-10-CM

## 2020-07-12 MED ORDER — PREDNISONE 10 MG PO TABS
10.0000 mg | ORAL_TABLET | Freq: Every day | ORAL | 0 refills | Status: DC
Start: 2020-07-12 — End: 2020-07-12

## 2020-07-12 MED ORDER — HYDROCORTISONE 2.5 % EX CREA: TOPICAL_CREAM | Freq: Two times a day (BID) | CUTANEOUS | 0 refills | Status: DC

## 2020-07-12 MED ORDER — PREDNISONE 10 MG PO TABS: ORAL_TABLET | ORAL | 0 refills | Status: DC

## 2020-07-12 NOTE — Telephone Encounter (Signed)
Requesting: Tylenol #3 Contract: None UDS: None Last Visit: 07/12/2020 w/ Dr. Larose Kells Next Visit: None Last Refill: 05/31/2020 #15 and 0RF Pt sig: 1-2 tab q8h prn  Please Advise

## 2020-07-12 NOTE — Patient Instructions (Signed)
I sent prednisone 10 mg: Take 1 tablet daily for the next 5 days  I sent a prescription strength hydrocortisone 2.5%: Apply twice a day as needed  Use a cool compress on the affected areas once daily. After the area gets dry, use Aveeno over-the-counter lotion for hydration.  We are referring you to a new dermatology

## 2020-07-12 NOTE — Progress Notes (Signed)
Subjective:    Patient ID: Alexa [name], female    DOB: 06-Nov-1929, 86 y.o.   MRN: [id number]  DOS:  07/12/2020 Type of visit - description: Acute, facial swelling Chart is reviewed: Orthopedic visit  06/01/2020: DX left olecranon bursa, aspiration performed, send it for culture: MSSA.  Started Bactrim 06/15/2020 follow-up for olecranon infection.  She was noted to be better on Bactrim  Went to the ER 06/26/2020, was seen with a diffuse rash on the face, left arm, right arm .  They felt that the rash was probably contact dermatitis but more likely a drug reaction.  Labs unremarkable.  She is here today because although most of the rash is better, she continue with a facial rash.  States that this rash is going on for over a year, and she has consulted about this with PCP and Dr. Ubaldo Glassing, dermatology. Reports that the skin at the forehead, neck  and around the eyes is red, itchy, dry. States that prednisone and steroid creams helped temporarily.   Review of Systems No fever occasional chills. Skin in general is dry and itchy.   Past Medical History:  Diagnosis Date  . Asthma   . Chronic obstructive pulmonary disease ([place])   . Diverticulosis   . Diverticulosis of colon (without mention of hemorrhage)   . Esophageal reflux   . History of colonic polyps 06/05/05   hyperplastic  . Internal hemorrhoids   . Irritable bowel syndrome   . Ischemic colitis ([place])   . Malignant neoplasm of kidney and other and unspecified urinary organs    renal carcinoma, left nephrectomy in 2008-per patient.  . Other and unspecified hyperlipidemia   . Personal history of venous thrombosis and embolism    PTE after nephrectomy  . Spinal stenosis   . Type II or unspecified type diabetes mellitus without mention of complication, not stated as uncontrolled 02/05/2012   pt states it was in 2007  . Unspecified essential hypertension     Past Surgical History:  Procedure Laterality Date  . BREAST  BIOPSY    . COLONOSCOPY W/ POLYPECTOMY  2000  . COLONOSCOPY W/ POLYPECTOMY  07/2004   Hyperplastic polyps  . DILATION AND CURETTAGE OF UTERUS  03/2005   Uterine Polyps  . NEPHRECTOMY  2007   right,Dr Dahlstedt  . SEPTOPLASTY    . TONSILLECTOMY      Allergies as of 07/12/2020      Reactions   Lisinopril    Cough D/Ced by Dr Melvyn Novas   Codeine    nausea   Verapamil    Pain in feet      Medication List       Accurate as of July 12, 2020 11:59 PM. If you have any questions, ask your nurse or doctor.        acetaminophen-codeine 300-30 MG tablet Commonly known as: TYLENOL #3 TAKE 1 TO 2 TABLETS BY MOUTH EVERY 8 HOURS AS NEEDED FOR MODERATE PAIN   aspirin EC 81 MG tablet Take 162 mg by mouth daily.   buPROPion 100 MG tablet Commonly known as: WELLBUTRIN TAKE 1 TABLET(100 MG) BY MOUTH TWICE DAILY   busPIRone 7.5 MG tablet Commonly known as: BUSPAR Take 2 tablets (15 mg total) by mouth 2 (two) times daily. May take one 4x a day if preferred   CALCIUM PO Take 1 tablet by mouth daily.   CRANBERRY PO Take 2 capsules by mouth 2 (two) times daily. 2m   DIABETIC BASICS HEALTHY FOOT EX  Apply 1 application topically daily as needed (foot pain). OTC diabetic foot cream   ESTRACE VAGINAL 0.1 MG/GM vaginal cream Generic drug: estradiol Apply locally every other night at bedtime   EYE VITAMINS PO Take by mouth.   fluticasone 50 MCG/ACT nasal spray Commonly known as: FLONASE INSTILL 2 SPRAYS INTO EACH NOSTRIL ONCE DAILY   gabapentin 100 MG capsule Commonly known as: NEURONTIN Take 1 capsule (100 mg total) by mouth 3 (three) times daily. Take one capsule by mouth three times daily.   glucose blood test strip Commonly known as: TRUEtest Test Use to test blood sugar ICD 10 E11 9   hydrocortisone 2.5 % cream Apply topically 2 (two) times daily. Started by: Kathlene November, MD   hydrOXYzine 10 MG tablet Commonly known as: ATARAX/VISTARIL Take 1-2 tablets (10-20 mg  total) by mouth 3 (three) times daily as needed for itching.   loratadine 10 MG tablet Commonly known as: CLARITIN Take 1 tablet (10 mg total) by mouth daily.   predniSONE 10 MG tablet Commonly known as: DELTASONE 2 tabs x 3 days, 1 tab x 3 days What changed:   medication strength  additional instructions  Another medication with the same name was removed. Continue taking this medication, and follow the directions you see here. Changed by: Kathlene November, MD   triamcinolone 0.025 % cream Commonly known as: KENALOG APPLY 1 APPLICATION TOPICALLY TWICE DAILY AS NEEDED FOR DRY SKIN ON BACK   TRUEplus Lancets 28G Misc Use to test blood sugar ICD 10 E11 9   TRUEresult Blood Glucose w/Device Kit Use to test blood sugar ICD 10 E11 9   valsartan 160 MG tablet Commonly known as: DIOVAN Take 1 by mouth once daily for blood pressure   Vitamin C Powd Take 1 packet by mouth 2 (two) times daily as needed (cold symptoms).   Vitamin D (Ergocalciferol) 1.25 MG (50000 UNIT) Caps capsule Commonly known as: DRISDOL Take 50,000 Units by mouth once a week.   VITAMIN D PO Take 1 tablet by mouth daily. 2000 units          Objective:   Physical Exam HENT:     Head:     BP (!) 152/90 (BP Location: Left Arm, Patient Position: Sitting, Cuff Size: Large)   Pulse (!) 102   Temp 97.9 F (36.6 C) (Oral)   Ht 5' 2"  (1.575 m)   Wt 124 lb (56.2 kg)   SpO2 98%   BMI 22.68 kg/m  General:   Well developed, NAD, BMI noted. HEENT:  Normocephalic . Face symmetric, atraumatic  Neurologic:  alert & oriented X3.  Speech normal, gait appropriate for age and unassisted Psych--  Cognition and judgment appear intact.  Cooperative with normal attention span and concentration.  Behavior appropriate. No anxious or depressed appearing.        Assessment      88-y89r-old patient PMH includes DM, HTN, COPD, depression, asthma, GERD, ischemic colitis, renal carcinoma, hyperlipidemia, history of  PE,  presents with the following  Dermatitis: Dermatitis according to the patient is going on for 1 year, characterized by redness, itching, dryness on the forehead, around the eyes and under the jaw. Steroids helped temporarily. Reports she already discussed this with PCP and dermatology and  really needs some relief. Advised patient I really cannot tell exactly what it is, does not seem to be an allergic reaction to Bactrim which was prescribed recently for olecranon bursitis. Eventually we agreed on: Continue with topical steroids, new prescription  sent, she insisted on oral steroids, she is very aware of the risk of taking on and off steroid orally, a small amount sent despite her insistence to have a much larger dose.  Refer to annual dermatology for a second opinion.  Time spent with the patient 32 minutes due to extensive chart review and also due to prolonged discussion about the deleterious effect of the steroids when prescribed recurrently. This visit occurred during the SARS-CoV-2 public health emergency.  Safety protocols were in place, including screening questions prior to the visit, additional usage of staff PPE, and extensive cleaning of exam room while observing appropriate contact time as indicated for disinfecting solutions.

## 2020-07-13 ENCOUNTER — Telehealth: Payer: Self-pay | Admitting: Pharmacist

## 2020-07-13 NOTE — Progress Notes (Addendum)
Chronic Care Management Pharmacy Assistant   Name: Alexa [name]  MRN: [id number] DOB: Jul 21, 1930  Reason for Encounter: Disease State  Patient Questions:  1.  Have you seen any other providers since your last visit? Yes  2.  Any changes in your medicines or health? Yes    PCP : Copland, Gay Filler, MD   Their chronic conditions include: Diabetes, Hypertension, Hyperlipidemia, Asthma, Neuropathy/Pain, Depression, Allergic Rhinitis, Estrogen Deficiency, Rash  Office Visits: 07-12-2020 (PCP) Patient presented in the office c/o facial rash that has been going on for a year. Derm and PCP have been notified by the patient in the past. Steroids have helped temporarily. Prescribed Prednisone 10 mg daily for five days and Hydrocortisone 2.5% to apply twice a day PRN. Referred to Derm.  Consults: 06-15-2020 (Ortho) Patient presented in the office with PA Hill for f/u of left elbow. She underwent aspiration of olecranon bursa 2 weeks ago with aspirate labs limited to culture wound culture was positive for MSSA sensitive to Bactrim   06-08-2020 (Ortho) OV with PA Hill for left elbow pain  Hospitalizations: 06-26-2020- Patient presented in the ED c/o facial and arm swelling. She reports diffuse rash that involves her face as well as her left arm and to a lesser extent her right arm. Has a hx of olecranon bursitis. She was treated with steroids and antihistamines. Prednisone 10 mg tab, Loratadine 10 mg tab.   Allergies:   Allergies  Allergen Reactions   Lisinopril     Cough D/Ced by Dr Melvyn Novas   Codeine     nausea   Verapamil     Pain in feet    Medications: Outpatient Encounter Medications as of 07/13/2020  Medication Sig Note   acetaminophen-codeine (TYLENOL #3) 300-30 MG tablet TAKE 1 TO 2 TABLETS BY MOUTH EVERY 8 HOURS AS NEEDED FOR MODERATE PAIN    Ascorbic Acid (VITAMIN C) POWD Take 1 packet by mouth 2 (two) times daily as needed (cold symptoms).    aspirin EC 81 MG tablet Take  162 mg by mouth daily.    Benzalkonium Chloride (DIABETIC BASICS HEALTHY FOOT EX) Apply 1 application topically daily as needed (foot pain). OTC diabetic foot cream    Blood Glucose Monitoring Suppl (TRUERESULT BLOOD GLUCOSE) W/DEVICE KIT Use to test blood sugar ICD 10 E11 9    buPROPion (WELLBUTRIN) 100 MG tablet TAKE 1 TABLET(100 MG) BY MOUTH TWICE DAILY    busPIRone (BUSPAR) 7.5 MG tablet Take 2 tablets (15 mg total) by mouth 2 (two) times daily. May take one 4x a day if preferred    CALCIUM PO Take 1 tablet by mouth daily. 01/03/2020: Unsure of dosage   Cholecalciferol (VITAMIN D PO) Take 1 tablet by mouth daily. 2000 units    CRANBERRY PO Take 2 capsules by mouth 2 (two) times daily. 52m    ESTRACE VAGINAL 0.1 MG/GM vaginal cream Apply locally every other night at bedtime    fluticasone (FLONASE) 50 MCG/ACT nasal spray INSTILL 2 SPRAYS INTO EACH NOSTRIL ONCE DAILY    gabapentin (NEURONTIN) 100 MG capsule Take 1 capsule (100 mg total) by mouth 3 (three) times daily. Take one capsule by mouth three times daily.    glucose blood (TRUETEST TEST) test strip Use to test blood sugar ICD 10 E11 9    hydrocortisone 2.5 % cream Apply topically 2 (two) times daily.    hydrOXYzine (ATARAX/VISTARIL) 10 MG tablet Take 1-2 tablets (10-20 mg total) by mouth 3 (three) times daily  as needed for itching. (Patient not taking: Reported on 07/12/2020)    loratadine (CLARITIN) 10 MG tablet Take 1 tablet (10 mg total) by mouth daily. (Patient not taking: Reported on 07/12/2020)    Multiple Vitamins-Minerals (EYE VITAMINS PO) Take by mouth.    predniSONE (DELTASONE) 10 MG tablet 2 tabs x 3 days, 1 tab x 3 days    triamcinolone (KENALOG) 0.025 % cream APPLY 1 APPLICATION TOPICALLY TWICE DAILY AS NEEDED FOR DRY SKIN ON BACK    TRUEPLUS LANCETS 28G MISC Use to test blood sugar ICD 10 E11 9    valsartan (DIOVAN) 160 MG tablet Take 1 by mouth once daily for blood pressure    Vitamin D, Ergocalciferol, (DRISDOL) 1.25 MG  (50000 UNIT) CAPS capsule Take 50,000 Units by mouth once a week.    [DISCONTINUED] triamcinolone (KENALOG) 0.025 % cream APPLY 1 APPLICATION TOPICALLY TWICE DAILY AS NEEDED FOR DRY SKIN ON BACK    No facility-administered encounter medications on file as of 07/13/2020.    Current Diagnosis: Patient Active Problem List   Diagnosis Date Noted   Solitary kidney 12/15/2018   Cough variant asthma 12/29/2014   Spinal stenosis in cervical region 12/13/2014   Radiculopathy, lumbar region 12/13/2014   Depression 05/03/2008   PERONEAL NEUROPATHY 05/03/2008   Malignant neoplasm of kidney excluding renal pelvis ([place]) 08/06/2007   Controlled type 2 diabetes mellitus without complication, without long-term current use of insulin ([place]) 08/06/2007   GERD 08/06/2007   PULMONARY EMBOLISM, HX OF 08/06/2007   Hyperlipidemia 09/03/2006   Essential hypertension 09/03/2006   IBS 09/03/2006    Goals Addressed   None    Reviewed chart prior to disease state call. Spoke with patient regarding BP  Recent Office Vitals: BP Readings from Last 3 Encounters:  07/12/20 (!) 152/90  06/26/20 140/75  05/31/20 120/72   Pulse Readings from Last 3 Encounters:  07/12/20 (!) 102  06/26/20 86  05/31/20 78    Wt Readings from Last 3 Encounters:  07/12/20 124 lb (56.2 kg)  06/26/20 120 lb (54.4 kg)  05/31/20 126 lb (57.2 kg)     Kidney Function Lab Results  Component Value Date/Time   CREATININE 0.87 06/26/2020 02:59 PM   CREATININE 0.97 11/17/2019 04:30 PM   CREATININE 1.24 (H) 04/03/2018 03:52 PM   CREATININE 1.08 05/07/2013 05:05 PM   GFR 53.67 (L) 12/16/2018 11:57 AM   GFRNONAA >60 06/26/2020 02:59 PM   GFRAA >60 11/17/2019 04:30 PM    BMP Latest Ref Rng & Units 06/26/2020 11/17/2019 08/25/2019  Glucose 70 - 99 mg/dL 135(H) 130(H) 156(H)  BUN 8 - 23 mg/dL 20 22 23   Creatinine 0.44 - 1.00 mg/dL 0.87 0.97 0.94  BUN/Creat Ratio 6 - 22 (calc) - - -  Sodium 135 - 145 mmol/L 140 137 139  Potassium  3.5 - 5.1 mmol/L 4.4 4.3 4.2  Chloride 98 - 111 mmol/L 106 104 105  CO2 22 - 32 mmol/L 24 23 24   Calcium 8.9 - 10.3 mg/dL 8.9 9.0 9.2    Current antihypertensive regimen:  Valsartan 186m daily  How often are you checking your Blood Pressure? daily   Current home BP readings: 154/90 was her reading this morning.  What recent interventions/DTPs have been made by any provider to improve Blood Pressure control since last CPP Visit: None  Any recent hospitalizations or ED visits since last visit with CPP? Yes   What diet changes have been made to improve Blood Pressure Control?  None What exercise  is being done to improve your Blood Pressure Control?  None   Adherence Review: Is the patient currently on ACE/ARB medication? Yes valsartan 160 MG tablet Does the patient have >5 day gap between last estimated fill dates? No CPP please confirm  Patient reports get a refill of Valsartan about a week or two ago in the mail with [facility] mail order and is taking it everyday.   Follow-Up:  Pharmacist Review   Fanny Skates, [place] Pharmacist Assistant [phone]  4 minutes spent in review, coordination, and documentation.   Reviewed by: De Blanch, PharmD, BCACP Clinical Pharmacist [facility] [phone]

## 2020-07-19 ENCOUNTER — Emergency Department (HOSPITAL_COMMUNITY)
Admission: EM | Admit: 2020-07-19 | Discharge: 2020-07-19 | Disposition: A | Discharge status: Home / Self Care | Payer: Medicare HMO | Attending: Emergency Medicine | Admitting: Emergency Medicine

## 2020-07-19 ENCOUNTER — Encounter (HOSPITAL_COMMUNITY): Payer: Self-pay | Admitting: Emergency Medicine

## 2020-07-19 ENCOUNTER — Other Ambulatory Visit: Payer: Self-pay

## 2020-07-19 DIAGNOSIS — L539 Erythematous condition, unspecified: Secondary | ICD-10-CM | POA: Diagnosis not present

## 2020-07-19 DIAGNOSIS — R220 Localized swelling, mass and lump, head: Secondary | ICD-10-CM | POA: Insufficient documentation

## 2020-07-19 DIAGNOSIS — Z5321 Procedure and treatment not carried out due to patient leaving prior to being seen by health care provider: Secondary | ICD-10-CM | POA: Insufficient documentation

## 2020-07-19 DIAGNOSIS — L299 Pruritus, unspecified: Secondary | ICD-10-CM | POA: Insufficient documentation

## 2020-07-19 NOTE — ED Triage Notes (Signed)
Patient has had issues with redness, itching and swelling x1 year or so. Today she presents with diffuse facial redness, redness and that spreads down bilateral arms and trunk. Able to speak freely, denies SOB.

## 2020-07-20 ENCOUNTER — Telehealth: Payer: Self-pay | Admitting: Family Medicine

## 2020-07-20 DIAGNOSIS — R609 Edema, unspecified: Secondary | ICD-10-CM

## 2020-07-20 NOTE — Telephone Encounter (Signed)
Tried calling patient, rang several times no answer then disconnected.  Tried calling other number on file. No answer. Left message to return call.

## 2020-07-20 NOTE — Telephone Encounter (Signed)
Patient states she is swollen all over. She would like to know what to do.

## 2020-07-20 NOTE — Telephone Encounter (Signed)
Spoke with patient. She states her arms, shoulder, jaw, face and her ankles are swollen She expresses she "doesn't know if it is her heart or her kidneys". She expressed she went to the ER last night but "no one acted like they cared to see her. She left without being seen after 5 hours"  I advised them they are very busy and try to do everything they can. She stated "you don't need to make excuses for those people there is no need"  I advised her she likely would need to be seen but we do not have anything today. She states "I do not want to see yall anyway that is not what Im asking" She then states she called Dr. Rosezella [place] office Cardiology and asked for an appointment. She states they told her they need a referral. I have pended referral. Please advise if appropriate.

## 2020-07-21 ENCOUNTER — Encounter (HOSPITAL_COMMUNITY): Payer: Self-pay | Admitting: Emergency Medicine

## 2020-07-21 ENCOUNTER — Emergency Department (HOSPITAL_COMMUNITY)
Admission: EM | Admit: 2020-07-21 | Discharge: 2020-07-21 | Disposition: A | Discharge status: Home / Self Care | Payer: Medicare HMO | Attending: Emergency Medicine | Admitting: Emergency Medicine

## 2020-07-21 ENCOUNTER — Other Ambulatory Visit: Payer: Self-pay

## 2020-07-21 DIAGNOSIS — L308 Other specified dermatitis: Secondary | ICD-10-CM | POA: Diagnosis not present

## 2020-07-21 DIAGNOSIS — L539 Erythematous condition, unspecified: Secondary | ICD-10-CM

## 2020-07-21 DIAGNOSIS — L299 Pruritus, unspecified: Secondary | ICD-10-CM | POA: Diagnosis not present

## 2020-07-21 DIAGNOSIS — J45909 Unspecified asthma, uncomplicated: Secondary | ICD-10-CM | POA: Diagnosis not present

## 2020-07-21 DIAGNOSIS — R21 Rash and other nonspecific skin eruption: Secondary | ICD-10-CM | POA: Diagnosis not present

## 2020-07-21 DIAGNOSIS — J449 Chronic obstructive pulmonary disease, unspecified: Secondary | ICD-10-CM | POA: Diagnosis not present

## 2020-07-21 DIAGNOSIS — Z86718 Personal history of other venous thrombosis and embolism: Secondary | ICD-10-CM | POA: Diagnosis not present

## 2020-07-21 DIAGNOSIS — Z7982 Long term (current) use of aspirin: Secondary | ICD-10-CM | POA: Diagnosis not present

## 2020-07-21 DIAGNOSIS — I10 Essential (primary) hypertension: Secondary | ICD-10-CM | POA: Diagnosis not present

## 2020-07-21 DIAGNOSIS — E119 Type 2 diabetes mellitus without complications: Secondary | ICD-10-CM | POA: Insufficient documentation

## 2020-07-21 DIAGNOSIS — R609 Edema, unspecified: Secondary | ICD-10-CM | POA: Diagnosis not present

## 2020-07-21 DIAGNOSIS — Z85528 Personal history of other malignant neoplasm of kidney: Secondary | ICD-10-CM | POA: Diagnosis not present

## 2020-07-21 DIAGNOSIS — Z8550 Personal history of malignant neoplasm of unspecified urinary tract organ: Secondary | ICD-10-CM | POA: Insufficient documentation

## 2020-07-21 DIAGNOSIS — Z79899 Other long term (current) drug therapy: Secondary | ICD-10-CM | POA: Diagnosis not present

## 2020-07-21 LAB — COMPREHENSIVE METABOLIC PANEL
ALT: 19 U/L (ref 0–44)
AST: 21 U/L (ref 15–41)
Albumin: 3.3 g/dL — ABNORMAL LOW (ref 3.5–5.0)
Alkaline Phosphatase: 51 U/L (ref 38–126)
Anion gap: 12 (ref 5–15)
BUN: 22 mg/dL (ref 8–23)
CO2: 22 mmol/L (ref 22–32)
Calcium: 8.8 mg/dL — ABNORMAL LOW (ref 8.9–10.3)
Chloride: 106 mmol/L (ref 98–111)
Creatinine, Ser: 1.12 mg/dL — ABNORMAL HIGH (ref 0.44–1.00)
GFR, Estimated: 47 mL/min — ABNORMAL LOW (ref >60)
Glucose, Bld: 166 mg/dL — ABNORMAL HIGH (ref 70–99)
Potassium: 4.2 mmol/L (ref 3.5–5.1)
Sodium: 140 mmol/L (ref 135–145)
Total Bilirubin: 1.2 mg/dL (ref 0.3–1.2)
Total Protein: 6.1 g/dL — ABNORMAL LOW (ref 6.5–8.1)

## 2020-07-21 LAB — CBC WITH DIFFERENTIAL/PLATELET
Abs Immature Granulocytes: 0.09 10*3/uL — ABNORMAL HIGH (ref 0.00–0.07)
Basophils Absolute: 0 10*3/uL (ref 0.0–0.1)
Basophils Relative: 0 %
Eosinophils Absolute: 0.7 10*3/uL — ABNORMAL HIGH (ref 0.0–0.5)
Eosinophils Relative: 7 %
HCT: 43.8 % (ref 36.0–46.0)
Hemoglobin: 14 g/dL (ref 12.0–15.0)
Immature Granulocytes: 1 %
Lymphocytes Relative: 15 %
Lymphs Abs: 1.6 10*3/uL (ref 0.7–4.0)
MCH: 28.5 pg (ref 26.0–34.0)
MCHC: 32 g/dL (ref 30.0–36.0)
MCV: 89.2 fL (ref 80.0–100.0)
Monocytes Absolute: 0.9 10*3/uL (ref 0.1–1.0)
Monocytes Relative: 8 %
Neutro Abs: 7.1 10*3/uL (ref 1.7–7.7)
Neutrophils Relative %: 69 %
Platelets: 343 10*3/uL (ref 150–400)
RBC: 4.91 MIL/uL (ref 3.87–5.11)
RDW: 14.5 % (ref 11.5–15.5)
WBC: 10.4 10*3/uL (ref 4.0–10.5)
nRBC: 0 % (ref 0.0–0.2)

## 2020-07-21 MED ORDER — METHYLPREDNISOLONE SODIUM SUCC 125 MG IJ SOLR
125.0000 mg | Freq: Once | INTRAMUSCULAR | Status: AC
  Administered 2020-07-21: 18:00:00 125 mg via INTRAVENOUS
  Filled 2020-07-21: qty 2

## 2020-07-21 MED ORDER — HYDROXYZINE HCL 25 MG PO TABS
12.5000 mg | ORAL_TABLET | Freq: Four times a day (QID) | ORAL | 0 refills | Status: DC | PRN
Start: 2020-07-21 — End: 2020-11-09

## 2020-07-21 MED ORDER — FAMOTIDINE IN NACL 20-0.9 MG/50ML-% IV SOLN
20.0000 mg | Freq: Once | INTRAVENOUS | Status: AC
  Administered 2020-07-21: 17:00:00 20 mg via INTRAVENOUS
  Filled 2020-07-21: qty 50

## 2020-07-21 MED ORDER — DIPHENHYDRAMINE HCL 50 MG/ML IJ SOLN
12.5000 mg | Freq: Once | INTRAMUSCULAR | Status: AC
  Administered 2020-07-21: 17:00:00 12.5 mg via INTRAVENOUS
  Filled 2020-07-21: qty 1

## 2020-07-21 MED ORDER — PREDNISONE 20 MG PO TABS: ORAL_TABLET | ORAL | 0 refills | Status: DC

## 2020-07-21 MED ORDER — SODIUM CHLORIDE 0.9 % IV BOLUS
1000.0000 mL | Freq: Once | INTRAVENOUS | Status: AC
  Administered 2020-07-21: 17:00:00 1000 mL via INTRAVENOUS

## 2020-07-21 NOTE — ED Notes (Signed)
Discharge instructions discussed with pt. Pt verbalized understanding. Pt stable and ambulatory. No signature pad available. 

## 2020-07-21 NOTE — Discharge Instructions (Signed)
Take cool showers. Use mild soap like Aveeno restorative skin therapy bodywash or  La Roche-Posay Lipikar face and body wash.   Contact a health care provider if: The itching does not go away after several days. You are unusually thirsty or urinating more than normal. Your skin tingles or feels numb. Your skin or the white parts of your eyes turn yellow (jaundice). You feel weak. You have any of the following: Night sweats. Tiredness (fatigue). Weight loss. Abdominal pain.

## 2020-07-21 NOTE — ED Triage Notes (Signed)
Pt presents to ED BIB [facility]. Pt c/o dermitis and bilateral arm edema. Pt has redness all over body that began x1y ago. EMS VSS

## 2020-07-21 NOTE — ED Provider Notes (Signed)
[facility] EMERGENCY DEPARTMENT Provider Note   CSN: [id number] Arrival date & time: 07/21/20  1554     History Chief Complaint  Patient presents with  . Rash    Alexa [name] is a 86 y.o. female who has had Generalized rash for the past year. She saw Dr. Ubaldo Glassing at [facility] in July 2020 however her symptoms have worsened significantly since that time.  Review of EMR shows that she had a punch biopsy which showed perivascular dermatitis with eosinophilia.  She has had multiple rounds of steroids, repeat secondary bacterial infections and has had multiple recent antibiotics most recently Bactrim about 2 weeks ago.  She states that since that time her symptoms have gotten severely worse.  She was taken off of oral steroids by a physician that she saw and is currently only using triamcinolone however she states that she is very miserable.  Her skin is now painful.  She has areas of blistering and weeping.  She denies fevers but has had some chills. HPI     Past Medical History:  Diagnosis Date  . Asthma   . Chronic obstructive pulmonary disease ([place])   . Diverticulosis   . Diverticulosis of colon (without mention of hemorrhage)   . Esophageal reflux   . History of colonic polyps 06/05/05   hyperplastic  . Internal hemorrhoids   . Irritable bowel syndrome   . Ischemic colitis ([place])   . Malignant neoplasm of kidney and other and unspecified urinary organs    renal carcinoma, left nephrectomy in 2008-per patient.  . Other and unspecified hyperlipidemia   . Personal history of venous thrombosis and embolism    PTE after nephrectomy  . Spinal stenosis   . Type II or unspecified type diabetes mellitus without mention of complication, not stated as uncontrolled 02/05/2012   pt states it was in 2007  . Unspecified essential hypertension     Patient Active Problem List   Diagnosis Date Noted  . Solitary kidney 12/15/2018  . Cough variant asthma 12/29/2014  .  Spinal stenosis in cervical region 12/13/2014  . Radiculopathy, lumbar region 12/13/2014  . Depression 05/03/2008  . PERONEAL NEUROPATHY 05/03/2008  . Malignant neoplasm of kidney excluding renal pelvis ([place]) 08/06/2007  . Controlled type 2 diabetes mellitus without complication, without long-term current use of insulin ([place]) 08/06/2007  . GERD 08/06/2007  . PULMONARY EMBOLISM, HX OF 08/06/2007  . Hyperlipidemia 09/03/2006  . Essential hypertension 09/03/2006  . IBS 09/03/2006    Past Surgical History:  Procedure Laterality Date  . BREAST BIOPSY    . COLONOSCOPY W/ POLYPECTOMY  2000  . COLONOSCOPY W/ POLYPECTOMY  07/2004   Hyperplastic polyps  . DILATION AND CURETTAGE OF UTERUS  03/2005   Uterine Polyps  . NEPHRECTOMY  2007   right,Dr Dahlstedt  . SEPTOPLASTY    . TONSILLECTOMY       OB History   No obstetric history on file.     Family History  Problem Relation Age of Onset  . Stroke Mother        onset in 51s  . Diabetes Mother   . Cancer Mother        bladder; nephrectomy for calculi/also uterine , TAH & BSO  . Aneurysm Mother        thoracic  . Depression Mother   . Stroke Brother   . Heart failure Brother   . Heart attack Other        maternal family history  .  Heart failure Maternal Grandfather   . Lung cancer Father        smoked  . Heart failure Sister   . Depression Sister   . Alzheimer's disease Sister   . COPD Sister   . Aneurysm Brother        cns  . Depression Maternal Aunt        X2  . Bipolar disorder Sister   . Alcoholism Neg Hx     Social History   Tobacco Use  . Smoking status: Never Smoker  . Smokeless tobacco: Never Used  Vaping Use  . Vaping Use: Never used  Substance Use Topics  . Alcohol use: No  . Drug use: No    Home Medications Prior to Admission medications   Medication Sig Start Date End Date Taking? Authorizing Provider  acetaminophen-codeine (TYLENOL #3) 300-30 MG tablet TAKE 1 TO 2 TABLETS BY MOUTH EVERY 8  HOURS AS NEEDED FOR MODERATE PAIN 07/12/20   Copland, Gay Filler, MD  Ascorbic Acid (VITAMIN C) POWD Take 1 packet by mouth 2 (two) times daily as needed (cold symptoms).    [provider]  aspirin EC 81 MG tablet Take 162 mg by mouth daily.    [provider]  Benzalkonium Chloride (DIABETIC BASICS HEALTHY FOOT EX) Apply 1 application topically daily as needed (foot pain). OTC diabetic foot cream    [provider]  Blood Glucose Monitoring Suppl (TRUERESULT BLOOD GLUCOSE) W/DEVICE KIT Use to test blood sugar ICD 10 E11 9 07/14/14   Hendricks Limes, MD  buPROPion (WELLBUTRIN) 100 MG tablet TAKE 1 TABLET(100 MG) BY MOUTH TWICE DAILY 04/28/20   Copland, Gay Filler, MD  busPIRone (BUSPAR) 7.5 MG tablet Take 2 tablets (15 mg total) by mouth 2 (two) times daily. May take one 4x a day if preferred 04/28/20   Copland, Gay Filler, MD  CALCIUM PO Take 1 tablet by mouth daily.    [provider]  Cholecalciferol (VITAMIN D PO) Take 1 tablet by mouth daily. 2000 units    [provider]  CRANBERRY PO Take 2 capsules by mouth 2 (two) times daily. 34m    [provider]  ESTRACE VAGINAL 0.1 MG/GM vaginal cream Apply locally every other night at bedtime 01/16/16   [provider]  fluticasone (FLONASE) 50 MCG/ACT nasal spray INSTILL 2 SPRAYS INTO EACH NOSTRIL ONCE DAILY 04/28/20   Copland, JGay Filler MD  gabapentin (NEURONTIN) 100 MG capsule Take 1 capsule (100 mg total) by mouth 3 (three) times daily. Take one capsule by mouth three times daily. 04/28/20   Copland, JGay Filler MD  glucose blood (TRUETEST TEST) test strip Use to test blood sugar ICD 10 E11 9 07/14/14   HHendricks Limes MD  hydrocortisone 2.5 % cream Apply topically 2 (two) times daily. 07/12/20 07/12/21  PColon Branch MD  hydrOXYzine (ATARAX/VISTARIL) 10 MG tablet Take 1-2 tablets (10-20 mg total) by mouth 3 (three) times daily as needed for itching. Patient not taking: Reported on  07/12/2020 07/12/20   Copland, JGay Filler MD  loratadine (CLARITIN) 10 MG tablet Take 1 tablet (10 mg total) by mouth daily. Patient not taking: Reported on 07/12/2020 06/26/20   KDorie Rank MD  Multiple Vitamins-Minerals (EYE VITAMINS PO) Take by mouth.    [provider]  predniSONE (DELTASONE) 10 MG tablet 2 tabs x 3 days, 1 tab x 3 days 07/12/20   PColon Branch MD  triamcinolone (KENALOG) 0.025 % cream APPLY 1 APPLICATION  TOPICALLY TWICE DAILY AS NEEDED FOR DRY SKIN ON BACK 04/28/20   Copland, Gay Filler, MD  TRUEPLUS LANCETS 28G MISC Use to test blood sugar ICD 10 E11 9 07/14/14   Hendricks Limes, MD  valsartan (DIOVAN) 160 MG tablet Take 1 by mouth once daily for blood pressure 04/28/20   Copland, Gay Filler, MD  Vitamin D, Ergocalciferol, (DRISDOL) 1.25 MG (50000 UNIT) CAPS capsule Take 50,000 Units by mouth once a week. 05/31/20   [provider]  triamcinolone (KENALOG) 0.025 % cream APPLY 1 APPLICATION TOPICALLY TWICE DAILY AS NEEDED FOR DRY SKIN ON BACK 12/15/19   Copland, Gay Filler, MD    Allergies    Lisinopril, Codeine, and Verapamil  Review of Systems   Review of Systems Ten systems reviewed and are negative for acute change, except as noted in the HPI.   Physical Exam Updated Vital Signs BP (!) 142/92 (BP Location: Right Arm)   Pulse (!) 102   Temp 98 F (36.7 C) (Oral)   Resp 13   SpO2 98%   Physical Exam Vitals and nursing note reviewed.  Constitutional:      General: She is not in acute distress.    Appearance: She is well-developed and well-nourished. She is not diaphoretic.  HENT:     Head: Normocephalic and atraumatic.  Eyes:     General: No scleral icterus.    Conjunctiva/sclera: Conjunctivae normal.  Cardiovascular:     Rate and Rhythm: Normal rate and regular rhythm.     Heart sounds: Normal heart sounds. No murmur heard. No friction rub. No gallop.   Pulmonary:     Effort: Pulmonary effort is normal. No respiratory distress.      Breath sounds: Normal breath sounds.  Abdominal:     General: Bowel sounds are normal. There is no distension.     Palpations: Abdomen is soft. There is no mass.     Tenderness: There is no abdominal tenderness. There is no guarding.  Musculoskeletal:     Cervical back: Normal range of motion.  Skin:    Comments: Angry, erythematous, confluent rash involving the face neck arms upper trunk upper abdomen, groin, and lower extremities.  It spares the palms and soles of feet.  There are areas of excoriation, skin breakdown and weeping.  Is tender to palpation of bilateral antecubital regions with some mild weeping.  Small blistered areas present.  Neurological:     Mental Status: She is alert and oriented to person, place, and time.  Psychiatric:        Behavior: Behavior normal.     ED Results / Procedures / Treatments   Labs (all labs ordered are listed, but only abnormal results are displayed) Labs Reviewed - No data to display  EKG None  Radiology No results found.  Procedures Procedures (including critical care time)  Medications Ordered in ED Medications - No data to display  ED Course  I have reviewed the triage vital signs and the nursing notes.  Pertinent labs & imaging results that were available during my care of the patient were reviewed by me and considered in my medical decision making (see chart for details).    MDM Rules/Calculators/A&P                          Patient here with diffuse erythematous very pruritic rash which has been ongoing for some time. I suspect that worsening in her symptoms is due to the  removal of steroids. I have very low suspicion that this is a drug mediated erythroderma, I ordered and reviewed labs which included CBC which does show an increase in her eosinophil count however she is afebrile and I also have very low suspicion for dress syndrome given her previous biopsy results showing perivascular dermatitis with eosinophilia. I also  suspect that this is why histamine blocking agents are not very effective as it gave her 12.5 of IV Benadryl with minimal relief in her itching. Patient needs to follow very closely with her established dermatologist since she has not seen her. I do not believe she has secondary bacterial infection or needs antibiotics. She was seen and shared visit with Dr. Tyrone Nine who agrees with plan for discharge without antibiotics. She was given a 2-week steroid taper along with hydroxyzine, supportive home care to include cold showers, mild body wash without fragrance. Patient appears otherwise appropriate for discharge at this time Final Clinical Impression(s) / ED Diagnoses Final diagnoses:  None    Rx / DC Orders ED Discharge Orders    None       Margarita Mail, PA-C 07/21/20 Elenora Gamma, DO 07/21/20 [phone]

## 2020-07-25 ENCOUNTER — Telehealth: Payer: Self-pay | Admitting: *Deleted

## 2020-07-25 NOTE — Telephone Encounter (Signed)
Pt called regarding which pharmacy Rx was e-scribed to.  RNCM reviewed chart to access After Visit Summary and found that Rx was sent to pharmacy. Pt states pharmacy does not have it.  RNCM called in Rx to pharmacy of choice. Advised pt to pick up at her leisure.

## 2020-07-27 ENCOUNTER — Telehealth: Payer: Self-pay | Admitting: Family Medicine

## 2020-07-27 DIAGNOSIS — L270 Generalized skin eruption due to drugs and medicaments taken internally: Secondary | ICD-10-CM | POA: Diagnosis not present

## 2020-07-27 DIAGNOSIS — I10 Essential (primary) hypertension: Secondary | ICD-10-CM

## 2020-07-27 MED ORDER — AMLODIPINE BESYLATE 5 MG PO TABS: 5.0000 mg | ORAL_TABLET | Freq: Every day | ORAL | 3 refills | Status: DC

## 2020-07-27 NOTE — Telephone Encounter (Signed)
BP Readings from Last 3 Encounters:  07/21/20 (!) 167/100  07/19/20 (!) 144/96  07/12/20 (!) 152/90    Her Derm Dr Nicholas Lose called me- pt is in her office, she has an erythmatous rash and she suspects a drug reaction. Her most likely culprit is valsartan   Looking back, the patient has been on valsertan for several years but her dose was increased over the summer We decided to stop valsartan, I prescribed amlodipine instead for blood pressure control-5 mg daily The patient has a listed allergy to verapamil, states it caused pain in feet.  Dr. Nicholas Lose checked with the patient, she does not recall any more significant allergic reaction to verapamil, should be okay to use amlodipine  Another possible culprit is Wellbutrin, she is not on a very high dose of this medication and we decided to stop it as well  Per Dr. Nicholas Lose, if we are able to discover and discontinue the offending agent hopefully her rash and itching will get much better.  Meds ordered this encounter  Medications   amLODipine (NORVASC) 5 MG tablet    Sig: Take 1 tablet (5 mg total) by mouth daily.    Dispense:  90 tablet    Refill:  3

## 2020-08-03 DIAGNOSIS — L270 Generalized skin eruption due to drugs and medicaments taken internally: Secondary | ICD-10-CM | POA: Diagnosis not present

## 2020-08-03 DIAGNOSIS — L820 Inflamed seborrheic keratosis: Secondary | ICD-10-CM | POA: Diagnosis not present

## 2020-08-07 NOTE — Chronic Care Management (AMB) (Signed)
Chronic Care Management Pharmacy  Name: Alexa [name]  MRN: [id number] DOB: 04/13/1930  Chief Complaint/ HPI  Alexa [name],  87 y.o. , female presents for their Follow-Up CCM visit with the clinical pharmacist via telephone due to COVID-19 Pandemic.  PCP : Darreld Mclean, MD  Their chronic conditions include: Diabetes, Hypertension, Hyperlipidemia, Asthma, Neuropathy/Pain, Depression, Allergic Rhinitis, Estrogen Deficiency, Rash  Office Visits: 07/27/20: Provider message regarding possible drug reaction causing rash. Decision to D/C bupropion and valsartan and start amlodipine 37m daily.  07/12/20: Visit w/ Dr. PLarose Kells- Patient presented in the office c/o facial rash that has been going on for a year. Derm and PCP have been notified by the patient in the past. Steroids have helped temporarily. Prescribed Prednisone 10 mg daily for five days and Hydrocortisone 2.5% to apply twice a Cagney Steenson PRN. Referred to Derm.  Consult Visit: 06-15-2020 (Ortho) Patient presented in the office with PA Hill for f/u of left elbow. She underwent aspiration of olecranon bursa 2 weeks ago with aspirate labs limited to culture wound culture was positive for MSSA sensitive to Bactrim   06-08-2020 (Ortho) OV with PA Hill for left elbow pain  [facility]. She was given a 2-week steroid taper along with hydroxyzine, supportive home care to include cold showers, mild body wash without fragrance.   06/26/20:  Patient presented in the ED c/o facial and arm swelling. She reports diffuse rash that involves her face as well as her left arm and to a lesser extent her right arm. Has a hx of olecranon bursitis. She was treated with steroids and antihistamines. Prednisone 10 mg tab, Loratadine 10 mg tab.  Medications: Outpatient Encounter Medications as of 08/08/2020  Medication Sig Note   acetaminophen-codeine (TYLENOL #3) 300-30 MG tablet TAKE 1  TO 2 TABLETS BY MOUTH EVERY 8 HOURS AS NEEDED FOR MODERATE PAIN    amLODipine (NORVASC) 5 MG tablet Take 1 tablet (5 mg total) by mouth daily.    Ascorbic Acid (VITAMIN C) POWD Take 1 packet by mouth 2 (two) times daily as needed (cold symptoms).    busPIRone (BUSPAR) 7.5 MG tablet Take 2 tablets (15 mg total) by mouth 2 (two) times daily. May take one 4x a Autumm Hattery if preferred 08/08/2020: As needed   CRANBERRY PO Take 2 capsules by mouth 2 (two) times daily. 432m   Multiple Vitamins-Minerals (EYE VITAMINS PO) Take by mouth. AREDS2    triamcinolone (KENALOG) 0.025 % cream APPLY 1 APPLICATION TOPICALLY TWICE DAILY AS NEEDED FOR DRY SKIN ON BACK    Vitamin D, Ergocalciferol, (DRISDOL) 1.25 MG (50000 UNIT) CAPS capsule Take 50,000 Units by mouth once a week.    aspirin EC 81 MG tablet Take 162 mg by mouth daily.    Benzalkonium Chloride (DIABETIC BASICS HEALTHY FOOT EX) Apply 1 application topically daily as needed (foot pain). OTC diabetic foot cream    Blood Glucose Monitoring Suppl (TRUERESULT BLOOD GLUCOSE) W/DEVICE KIT Use to test blood sugar ICD 10 E11 9    CALCIUM PO Take 1 tablet by mouth daily. 01/03/2020: Unsure of dosage   Cholecalciferol (VITAMIN D PO) Take 1 tablet by mouth daily. 2000 units    ESTRACE VAGINAL 0.1 MG/GM vaginal cream Apply locally every other night at bedtime    fluticasone (FLONASE) 50 MCG/ACT nasal spray INSTILL 2 SPRAYS INTO EACH NOSTRIL ONCE DAILY    gabapentin (NEURONTIN) 100 MG capsule Take 1 capsule (100 mg  total) by mouth 3 (three) times daily. Take one capsule by mouth three times daily. (Patient not taking: Reported on 08/08/2020)    glucose blood (TRUETEST TEST) test strip Use to test blood sugar ICD 10 E11 9    hydrocortisone 2.5 % cream Apply topically 2 (two) times daily.    hydrOXYzine (ATARAX/VISTARIL) 10 MG tablet Take 1-2 tablets (10-20 mg total) by mouth 3 (three) times daily as needed for itching. (Patient not taking: Reported on 07/12/2020)     hydrOXYzine (ATARAX/VISTARIL) 25 MG tablet Take 0.5-1 tablets (12.5-25 mg total) by mouth every 6 (six) hours as needed for itching.    loratadine (CLARITIN) 10 MG tablet Take 1 tablet (10 mg total) by mouth daily. (Patient not taking: Reported on 07/12/2020)    predniSONE (DELTASONE) 20 MG tablet 3 tabs po daily x 3 days, then 2 tabs x 3 days, then 1.5 tabs x 3 days, then 1 tab x 3 days, then 0.5 tabs x 3 days 08/08/2020: Completed last dose 08/03/20   TRUEPLUS LANCETS 28G MISC Use to test blood sugar ICD 10 E11 9    No facility-administered encounter medications on file as of 08/08/2020.   SDOH Screenings   Alcohol Screen: Not on file  Depression (PHQ2-9): Low Risk    PHQ-2 Score: 1  Financial Resource Strain: Low Risk    Difficulty of Paying Living Expenses: Not hard at all  Food Insecurity: No Food Insecurity   Worried About Charity fundraiser in the Last Year: Never true   Ran Out of Food in the Last Year: Never true  Housing: Low Risk    Last Housing Risk Score: 0  Physical Activity: Not on file  Social Connections: Not on file  Stress: Not on file  Tobacco Use: Low Risk    Smoking Tobacco Use: Never Smoker   Smokeless Tobacco Use: Never Used  Transportation Needs: No Transportation Needs   Lack of Transportation (Medical): No   Lack of Transportation (Non-Medical): No     Current Diagnosis/Assessment:  Goals Addressed            This Visit's Progress    Chronic Care Management Pharmacy Care Plan       CARE PLAN ENTRY (see longitudinal plan of care for additional care plan information)  Current Barriers:   Chronic Disease Management support, education, and care coordination needs related to Diabetes, Hypertension, Hyperlipidemia, Asthma, Neuropathy/Pain, Depression, Allergic Rhinitis, Estrogen Deficiency, Rash   Hypertension BP Readings from Last 3 Encounters:  07/21/20 (!) 167/100  07/19/20 (!) 144/96  07/12/20 (!) 152/90    Pharmacist  Clinical Goal(s): o Over the next 90 days, patient will work with PharmD and providers to maintain BP goal <140/90  Current regimen:  o Amlodipine 12m daily  Interventions: o Requested patient to check 1-2 times per week  Patient self care activities - Over the next 90 days, patient will: o Check BP 1-2 times per week, document, and provide at future appointments o Ensure daily salt intake < 2300 mg/Zerek Litsey  Hyperlipidemia Lab Results  Component Value Date/Time   LDLCALC 106 (H) 12/16/2018 11:57 AM    Pharmacist Clinical Goal(s): o Over the next 90 days, patient will work with PharmD and providers to achieve LDL goal < 100 or prevent further increase in LDL  Current regimen:  o Diet and exercise management    Patient self care activities - Over the next 90 days, patient will: o Consider reducing cholesterol intake  Diabetes Lab Results  Component Value Date/Time   HGBA1C 6.8 (H) 11/29/2019 11:39 AM   HGBA1C 7.3 (H) 12/16/2018 11:57 AM    Pharmacist Clinical Goal(s): o Over the next 90 days, patient will work with PharmD and providers to maintain A1c goal <7%  Current regimen:  o Diet and exercise management    Interventions: o Discussed diet  Patient self care activities - Over the next 90 days, patient will: o Maintain a1c <7%  Health Maintenance  Pharmacist Clinical Goal(s) o Over the next 180 days, patient will work with PharmD and providers to complete health maintenance screenings  Interventions: o Recommend patient to complete DEXA Scan o Discussed importance of consuming 1292m of calcium daily through diet and/or supplementation  Patient self care activities - Over the next 180 days, patient will: o Complete DEXA Scan o Assure consumption of 12028mof calcium daily  Rash  Pharmacist Clinical Goal(s) o Over the next 90 days, patient will work with PharmD and providers to reduce symptoms of rash  Current regimen:  o Triamcinolone 0.025% cream as  needed  Interventions: o Collaboration with provider regarding disease management (derm/allergy referral?)  Patient self care activities - Over the next 90 days, patient will: o Maintain medication regimen for rash   Medication management  Pharmacist Clinical Goal(s): o Over the next 90 days, patient will work with PharmD and providers to maintain optimal medication adherence  [facility]  Interventions o Comprehensive medication review performed. o Continue current medication management strategy  Patient self care activities - Over the next 90 days, patient will: o Focus on medication adherence by filling and taking medications appropriately  o Take medications as prescribed o Report any questions or concerns to PharmD and/or provider(s)  Please see past updates related to this goal by clicking on the "Past Updates" button in the selected goal        Married 68 years.  Has 1 son living. Lost a daughter and grand daughter.  Has 3 great grandchildren. She cares for them after school and during the summer She has 2 dogs (poodle and maltese) and a cat.   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  Update 04/07/20 Moving back into her home Monday 9/13 and Tuesday 04/11/20  Update 08/08/20 States she has decided to take herself off of all of her medications except "blood pressure, itching pill, and pain pill"  Diabetes   A1c goal <7%  Recent Relevant Labs: Lab Results  Component Value Date/Time   HGBA1C 6.8 (H) 11/29/2019 11:39 AM   HGBA1C 7.3 (H) 12/16/2018 11:57 AM   MICROALBUR 0.9 02/25/2014 12:20 PM   MICROALBUR 0.9 12/07/2007 10:10 AM   Checking BG: Rarely  Patient has failed these meds in past: None noted  Patient is currently controlled on the following medications:   None noted   States she has lost weight Going to eye doctor on Friday  Last diabetic Eye exam: No results found for:  HMDIABEYEEXA  Last diabetic Foot exam: No results found for: HMDIABFOOTEX   We discussed: Importance of diet   Update 04/07/20 Has taken several courses of prednisone. Watchful of affect on a1c.  She states is eating more pasta, cookies, and ice cream since she has lost so much weight.  She is fearful of losing control of her diabetes, but she wants to gain weight. States she was given prescription vitamin [place]  Update 06/07/20 No longer taking prednisone.   Update 08/08/20 Patient would benefit from updated a1c  Plan -Continue control with diet and exercise    Hypertension   Blood pressure goal <140/90   CMP Latest Ref Rng & Units 07/21/2020 06/26/2020 11/17/2019  Glucose 70 - 99 mg/dL 166(H) 135(H) 130(H)  BUN 8 - 23 mg/dL 22 20 22   Creatinine 0.44 - 1.00 mg/dL 1.12(H) 0.87 0.97  Sodium 135 - 145 mmol/L 140 140 137  Potassium 3.5 - 5.1 mmol/L 4.2 4.4 4.3  Chloride 98 - 111 mmol/L 106 106 104  CO2 22 - 32 mmol/L 22 24 23   Calcium 8.9 - 10.3 mg/dL 8.8(L) 8.9 9.0  Total Protein 6.5 - 8.1 g/dL 6.1(L) - 6.9  Total Bilirubin 0.3 - 1.2 mg/dL 1.2 - 0.9  Alkaline Phos 38 - 126 U/L 51 - 56  AST 15 - 41 U/L 21 - 20  ALT 0 - 44 U/L 19 - 17   Kidney Function Lab Results  Component Value Date/Time   CREATININE 1.12 (H) 07/21/2020 04:24 PM   CREATININE 0.87 06/26/2020 02:59 PM   CREATININE 1.24 (H) 04/03/2018 03:52 PM   CREATININE 1.08 05/07/2013 05:05 PM   GFR 53.67 (L) 12/16/2018 11:57 AM   GFRNONAA 47 (L) 07/21/2020 04:24 PM   GFRAA >60 11/17/2019 04:30 PM   Office blood pressures are  BP Readings from Last 3 Encounters:  07/21/20 (!) 167/100  07/19/20 (!) 144/96  07/12/20 (!) 152/90   Patient has failed these meds in the past: lisinopril (cough) Patient is currently uncontrolled per clinic bp, but at goal per patient at home on the following medications:   Amlodipine 107m daily (changed from valsartan  on 07/27/20 in consultation with derm)  Patient checks BP at home 1-2x per week  Patient home BP readings are ranging: 145-150s  She felt dizzy when she fell last week.  Gets a headache when her blood pressure is elevated.  She tries to keep moving. Works mostly in her garden  Cut salt out of her diet due to her mother having a history of stroke.  We discussed Importance of blood pressure control   Update 04/07/20 States her BP is "low"  Update 06/07/20 Did you find BP machine? Yes, "my blood pressure is fine, honey. I know it is"  Can't report any specific readings (pt has not been checking BP). States her BP is elevated when her head starts to hurt and her head has not been hurting. States she hasn't received her BP medication. States she is using her old pills (unable to verify which dose this is) Called pharmacy to verify last fill date of Valsartan 1684m  Pharmacy reports medication was shipped 05/10/20. Inquired of how to get patient refill as patient states she does not have this medication at home and has been taking her lower dosage of the medication. Fel[facility] and will complete an over ride to send patient another refill   Update 08/08/20 Now taking amlodipine? Yes Rash improved since D/C valsartan? Yes States she takes her BP daily, but does not have any specific readings to provide 176/99 pulse 99, 168/101 pulse 91 recheck while on phone today. She identifies this as an elevated reading. Denies headaches, chest pains  Patient just took amlodipine, advised patient to recheck BP in 1 hour to see if BP has decreased.  Advised patient to report to ED if she develops any symptoms. Feels she needs a new BP cuff.  Plan -Continue current medications  -Check BP in 1 hour to assure blood pressure is trending down since taking BP medication -Continue checking blood pressure 1-2 times per week and record  Rash    Patient has failed these meds  in past: None noted  Patient is currently stable on the following medications: Triamcinolone 0.025% cream as needed  Has had a rash for over a year now and doesn't know what the cause is.  States she went to a dermatologist and was told to continue the cream she is currently prescribed. She states she is unsure of the diagnosis.  Interested in a referral to a new dermatologist to determine the cause of her rash. She feels it is more than an eczema type rash. Does report rough patches on her skin. Bumps and sores on her neck. "It's tormenting me".  She wonders which medications could be causing her to itch  We discussed:  The possibility of hydrocodone or gabapentin being the cause of her rash and the possibility for a derm or allergy referral   Update 04/07/20 Started a new round of prednisone yesterday.  She states her rash is doing better now.  She is hoping she will be able to continue on prednisone.  She is hoping when she moves into her home, her nerves will be better.  She states she took the hydroxyzine for a short period, but hasn't taken since she started new trial of prednisone.  Update 06/07/20 Still has rash. Hasn't gotten worse or better.  Update 08/08/20 Improvement since stopping valsartan and buproprion? Yes Grateful to be out of "torment" from skin rash that has been ongoing    Plan -Continue current medications    Follow up: 3 month followup  De Blanch, PharmD, BCACP Clinical Pharmacist [facility] [phone]

## 2020-08-08 ENCOUNTER — Ambulatory Visit: Payer: Medicare HMO | Admitting: Pharmacist

## 2020-08-08 DIAGNOSIS — E119 Type 2 diabetes mellitus without complications: Secondary | ICD-10-CM

## 2020-08-08 DIAGNOSIS — I10 Essential (primary) hypertension: Secondary | ICD-10-CM

## 2020-08-08 DIAGNOSIS — L309 Dermatitis, unspecified: Secondary | ICD-10-CM

## 2020-08-08 NOTE — Patient Instructions (Addendum)
Visit Information  Goals Addressed            This Visit's Progress   . Chronic Care Management Pharmacy Care Plan       CARE PLAN ENTRY (see longitudinal plan of care for additional care plan information)  Current Barriers:  . Chronic Disease Management support, education, and care coordination needs related to Diabetes, Hypertension, Hyperlipidemia, Asthma, Neuropathy/Pain, Depression, Allergic Rhinitis, Estrogen Deficiency, Rash   Hypertension BP Readings from Last 3 Encounters:  07/21/20 (!) 167/100  07/19/20 (!) 144/96  07/12/20 (!) 152/90   . Pharmacist Clinical Goal(s): o Over the next 90 days, patient will work with PharmD and providers to maintain BP goal <140/90 . Current regimen:  o Amlodipine 5mg  daily . Interventions: o Requested patient to check 1-2 times per week . Patient self care activities - Over the next 90 days, patient will: o Check BP 1-2 times per week, document, and provide at future appointments o Ensure daily salt intake < 2300 mg/[place] Shannahan  Hyperlipidemia Lab Results  Component Value Date/Time   LDLCALC 106 (H) 12/16/2018 11:57 AM   . Pharmacist Clinical Goal(s): o Over the next 90 days, patient will work with PharmD and providers to achieve LDL goal < 100 or prevent further increase in LDL . Current regimen:  o Diet and exercise management   . Patient self care activities - Over the next 90 days, patient will: o Consider reducing cholesterol intake  Diabetes Lab Results  Component Value Date/Time   HGBA1C 6.8 (H) 11/29/2019 11:39 AM   HGBA1C 7.3 (H) 12/16/2018 11:57 AM   . Pharmacist Clinical Goal(s): o Over the next 90 days, patient will work with PharmD and providers to maintain A1c goal <7% . Current regimen:  o Diet and exercise management   . Interventions: o Discussed diet . Patient self care activities - Over the next 90 days, patient will: o Maintain a1c <7%  Health Maintenance . Pharmacist Clinical Goal(s) o Over the next 180  days, patient will work with PharmD and providers to complete health maintenance screenings . Interventions: o Recommend patient to complete DEXA Scan o Discussed importance of consuming 1200mg  of calcium daily through diet and/or supplementation . Patient self care activities - Over the next 180 days, patient will: o Complete DEXA Scan o Assure consumption of 1200mg  of calcium daily  Rash . Pharmacist Clinical Goal(s) o Over the next 90 days, patient will work with PharmD and providers to reduce symptoms of rash . Current regimen:  o Triamcinolone 0.025% cream as needed . Interventions: o Collaboration with provider regarding disease management (derm/allergy referral?) . Patient self care activities - Over the next 90 days, patient will: o Maintain medication regimen for rash   Medication management . Pharmacist Clinical Goal(s): o Over the next 90 days, patient will work with PharmD and providers to maintain optimal medication adherence . Current pharmacy: [facility] . Interventions o Comprehensive medication review performed. o Continue current medication management strategy . Patient self care activities - Over the next 90 days, patient will: o Focus on medication adherence by filling and taking medications appropriately  o Take medications as prescribed o Report any questions or concerns to PharmD and/or provider(s)  Please see past updates related to this goal by clicking on the "Past Updates" button in the selected goal         The patient verbalized understanding of instructions, educational materials, and care plan provided today and agreed to receive a mailed copy of  patient instructions, educational materials, and care plan.   Telephone follow up appointment with pharmacy team member scheduled for: 11/09/2020  Melvenia Beam Copeland Neisen, [facility]

## 2020-08-30 ENCOUNTER — Other Ambulatory Visit: Payer: Self-pay | Admitting: Family Medicine

## 2020-08-31 NOTE — Telephone Encounter (Signed)
Last written: 07/12/20 Last ov: 05/31/20 Next ov: none

## 2020-09-01 DIAGNOSIS — L309 Dermatitis, unspecified: Secondary | ICD-10-CM | POA: Diagnosis not present

## 2020-09-01 DIAGNOSIS — L821 Other seborrheic keratosis: Secondary | ICD-10-CM | POA: Diagnosis not present

## 2020-09-01 DIAGNOSIS — L270 Generalized skin eruption due to drugs and medicaments taken internally: Secondary | ICD-10-CM | POA: Diagnosis not present

## 2020-09-08 ENCOUNTER — Encounter: Payer: Self-pay | Admitting: Cardiology

## 2020-09-27 ENCOUNTER — Telehealth: Payer: Self-pay | Admitting: Family Medicine

## 2020-09-27 NOTE — Telephone Encounter (Signed)
[employer] calling asking for a rx  True Metrix glucose meter Strips Lancet  hydrOXYzine (ATARAX/VISTARIL) 10 MG tablet [[id number]]   [facility] - [place], [address], [place] [address]  Phone:  [phone] Fax:  [phone]

## 2020-09-28 NOTE — Telephone Encounter (Signed)
Pleas advise on refill of atarax? States patient is not taking currently.

## 2020-09-29 NOTE — Telephone Encounter (Signed)
Called pt but had to [facility]. Does she need this medication?  Please let us know if she does need rx

## 2020-10-03 ENCOUNTER — Telehealth: Payer: Self-pay | Admitting: Family Medicine

## 2020-10-03 NOTE — Telephone Encounter (Signed)
Caller Alexa [name] Call Back @ [phone]  Patient states she would like a prescription for a new glucose meter and supplies. Patient is also requesting an order for a blood pressure machine.

## 2020-10-04 MED ORDER — GLUCOSE BLOOD VI STRP: ORAL_STRIP | 12 refills | Status: DC

## 2020-10-04 MED ORDER — TRUE METRIX METER W/DEVICE KIT: PACK | 0 refills | Status: DC

## 2020-10-04 MED ORDER — TRUEPLUS LANCETS 33G MISC: 3 refills | Status: DC

## 2020-10-04 NOTE — Telephone Encounter (Signed)
I sent in her glucose kit, test strips, lancets, however I not sure on how to order BP machine. Could you advise if this should just go on a blank rx to fax to [facility]?

## 2020-10-16 ENCOUNTER — Telehealth: Payer: Self-pay | Admitting: Family Medicine

## 2020-10-16 MED ORDER — PREDNISONE 10 MG PO TABS: ORAL_TABLET | ORAL | 0 refills | Status: DC

## 2020-10-16 NOTE — Telephone Encounter (Signed)
Patient wants to speak to Dr. Lorelei Pont. Patient wants something for her itching

## 2020-10-16 NOTE — Telephone Encounter (Signed)
Called pt back She continues to be confused and flustered as is her baseline.  See previous notes, I have been very concerned about this patient and her husband and have reported them to Adult Protective Services.  So far they have refused to allow their adult son to become involved in their care and continue to live alone  She has been to see dermatology, Dr. Ubaldo Glassing in February for her chronic dermatitis.  Unfortunately I do not have her office visit note available.  She was taking I believe prednisone, but apparently is no longer on this.  Patient is not sure if she was meant to continue this or what dose she might have been taking.  She states she has called Dr. Ubaldo Glassing to make a follow-up but has not been able to get through, she had to cancel her recent appointment due to being sick with " COVID-19"-she reports that about a month ago she became ill with a runny nose and cough.  She did not get tested but assumes that she had COVID-19 She complains that she is hurting all over, that her behind hurts and that she is very itchy.  She states she feels terrible.  Advised her to be seen at the ER if she is not okay, she declines to do this today.  We made an appt for this Wednesday at 11:40 so I can see her in person  Meds ordered this encounter  Medications  . predniSONE (DELTASONE) 10 MG tablet    Sig: Take 20 mg daily for one week, then decease to 10 mg daily until bottle finished    Dispense:  30 tablet    Refill:  0

## 2020-10-16 NOTE — Telephone Encounter (Signed)
Patient states she is itching all over. She would like some prednisone

## 2020-10-16 NOTE — Telephone Encounter (Signed)
Patient is calling to check the status of requesting for prednisone.

## 2020-10-18 ENCOUNTER — Other Ambulatory Visit: Payer: Self-pay

## 2020-10-18 ENCOUNTER — Encounter: Payer: Self-pay | Admitting: Family Medicine

## 2020-10-18 ENCOUNTER — Ambulatory Visit (INDEPENDENT_AMBULATORY_CARE_PROVIDER_SITE_OTHER): Payer: Medicare HMO | Admitting: Family Medicine

## 2020-10-18 VITALS — BP 145/60 | HR 80 | Temp 98.3°F | Ht 61.0 in | Wt 124.6 lb

## 2020-10-18 DIAGNOSIS — I10 Essential (primary) hypertension: Secondary | ICD-10-CM

## 2020-10-18 DIAGNOSIS — E559 Vitamin D deficiency, unspecified: Secondary | ICD-10-CM | POA: Diagnosis not present

## 2020-10-18 DIAGNOSIS — L03317 Cellulitis of buttock: Secondary | ICD-10-CM | POA: Diagnosis not present

## 2020-10-18 DIAGNOSIS — R7989 Other specified abnormal findings of blood chemistry: Secondary | ICD-10-CM

## 2020-10-18 DIAGNOSIS — E785 Hyperlipidemia, unspecified: Secondary | ICD-10-CM | POA: Diagnosis not present

## 2020-10-18 DIAGNOSIS — E119 Type 2 diabetes mellitus without complications: Secondary | ICD-10-CM

## 2020-10-18 LAB — COMPREHENSIVE METABOLIC PANEL
ALT: 21 U/L (ref 0–35)
AST: 17 U/L (ref 0–37)
Albumin: 3.9 g/dL (ref 3.5–5.2)
Alkaline Phosphatase: 74 U/L (ref 39–117)
BUN: 28 mg/dL — ABNORMAL HIGH (ref 6–23)
CO2: 24 mEq/L (ref 19–32)
Calcium: 8.9 mg/dL (ref 8.4–10.5)
Chloride: 99 mEq/L (ref 96–112)
Creatinine, Ser: 0.86 mg/dL (ref 0.40–1.20)
GFR: 60.11 mL/min (ref >60.00)
Glucose, Bld: 375 mg/dL — ABNORMAL HIGH (ref 70–99)
Potassium: 5 mEq/L (ref 3.5–5.1)
Sodium: 135 mEq/L (ref 135–145)
Total Bilirubin: 0.9 mg/dL (ref 0.2–1.2)
Total Protein: 6.9 g/dL (ref 6.0–8.3)

## 2020-10-18 LAB — CBC
HCT: 39.1 % (ref 36.0–46.0)
Hemoglobin: 12.9 g/dL (ref 12.0–15.0)
MCHC: 33 g/dL (ref 30.0–36.0)
MCV: 86.9 fl (ref 78.0–100.0)
Platelets: 318 10*3/uL (ref 150.0–400.0)
RBC: 4.49 Mil/uL (ref 3.87–5.11)
RDW: 14.6 % (ref 11.5–15.5)
WBC: 13.9 10*3/uL — ABNORMAL HIGH (ref 4.0–10.5)

## 2020-10-18 LAB — VITAMIN D 25 HYDROXY (VIT D DEFICIENCY, FRACTURES): VITD: 61.39 ng/mL (ref 30.00–100.00)

## 2020-10-18 LAB — TSH: TSH: 1.27 u[IU]/mL (ref 0.35–4.50)

## 2020-10-18 MED ORDER — VITAMIN D3 1.25 MG (50000 UT) PO CAPS: ORAL_CAPSULE | ORAL | 0 refills | Status: DC

## 2020-10-18 MED ORDER — AMOXICILLIN-POT CLAVULANATE 500-125 MG PO TABS: 1.0000 | ORAL_TABLET | Freq: Two times a day (BID) | ORAL | 0 refills | Status: DC

## 2020-10-18 MED ORDER — FLUCONAZOLE 150 MG PO TABS: 150.0000 mg | ORAL_TABLET | Freq: Once | ORAL | 0 refills | Status: AC

## 2020-10-18 NOTE — Progress Notes (Addendum)
[facility], [place], [place] [address] [phone]  Date:  10/18/2020   Name:  Alexa [name]   DOB:  12/12/1929   MRN:  [id number]  PCP:  Alexa Mclean, MD    Chief Complaint: Medical Management of Chronic Issues (F/u )   History of Present Illness:  Alexa [name] is a 87 y.o. very pleasant female patient who presents with the following:  Here today for a follow-up visit- see phone call from yesterday:  Called pt back She continues to be confused and flustered as is her baseline.  See previous notes, I have been very concerned about this patient and her husband and have reported them to Adult Protective Services.  So far they have refused to allow their adult son to become involved in their care and continue to live alone  She has been to see dermatology, Dr. Ubaldo [name] in February for her chronic dermatitis.  Unfortunately I do not have her office visit note available.  She was taking I believe prednisone, but apparently is no longer on this.  Patient is not sure if she was meant to continue this or what dose she might have been taking.  She states she has called Dr. Ubaldo [name] to make a follow-up but has not been able to get through, she had to cancel her recent appointment due to being sick with " COVID-19"-she reports that about a month ago she became ill with a runny nose and cough.  She did not get tested but assumes that she had COVID-19 She complains that she is hurting all over, that her behind hurts and that she is very itchy.  She states she feels terrible.  Advised her to be seen at the ER if she is not okay, she declines to do this today  Pt has been dealing with chronic itching and dermatitis, cause is uncertain  I last saw her in November of last year  She lives alone with her husband who is 91 years old.  They both suffer from significant physical debility and cognitive impairment.  Have thus far been  unsuccessful in convincing him to accept help from their son who lives in [place].  I have also made a report to Adult Protective Services with no results  Phone call yesterday I had her start on prednisone 20 mg for one week, then 10 mg until finished  She feels like this is helping her at least some  However, she also notes a "rash on my tailbone" About 5 days ago she noted severe pain in her buttock region, she was having difficulty sitting.  She feels that she had a boil.  It did not drain, but has become less painful.  No fever noted  She states she has stopped most of her medications on advice of her dermatologist.  She is however taking 5 mg amlodipine  Patient Active Problem List   Diagnosis Date Noted  . Solitary kidney 12/15/2018  . Cough variant asthma 12/29/2014  . Spinal stenosis in cervical region 12/13/2014  . Radiculopathy, lumbar region 12/13/2014  . Depression 05/03/2008  . PERONEAL NEUROPATHY 05/03/2008  . Malignant neoplasm of kidney excluding renal pelvis ([place]) 08/06/2007  . Controlled type 2 diabetes mellitus without complication, without long-term current use of insulin ([place]) 08/06/2007  . GERD 08/06/2007  . PULMONARY EMBOLISM, HX OF 08/06/2007  . Hyperlipidemia 09/03/2006  . Essential hypertension 09/03/2006  . IBS  09/03/2006    Past Medical History:  Diagnosis Date  . Asthma   . Chronic obstructive pulmonary disease ([place])   . Diverticulosis   . Diverticulosis of colon (without mention of hemorrhage)   . Esophageal reflux   . History of colonic polyps 06/05/05   hyperplastic  . Internal hemorrhoids   . Irritable bowel syndrome   . Ischemic colitis ([place])   . Malignant neoplasm of kidney and other and unspecified urinary organs    renal carcinoma, left nephrectomy in 2008-per patient.  . Other and unspecified hyperlipidemia   . Personal history of venous thrombosis and embolism    PTE after nephrectomy  . Spinal stenosis   . Type II or unspecified  type diabetes mellitus without mention of complication, not stated as uncontrolled 02/05/2012   pt states it was in 2007  . Unspecified essential hypertension     Past Surgical History:  Procedure Laterality Date  . BREAST BIOPSY    . COLONOSCOPY W/ POLYPECTOMY  2000  . COLONOSCOPY W/ POLYPECTOMY  07/2004   Hyperplastic polyps  . DILATION AND CURETTAGE OF UTERUS  03/2005   Uterine Polyps  . NEPHRECTOMY  2007   right,Dr Dahlstedt  . SEPTOPLASTY    . TONSILLECTOMY      Social History   Tobacco Use  . Smoking status: Never Smoker  . Smokeless tobacco: Never Used  Vaping Use  . Vaping Use: Never used  Substance Use Topics  . Alcohol use: No  . Drug use: No    Family History  Problem Relation Age of Onset  . Stroke Mother        onset in 36s  . Diabetes Mother   . Cancer Mother        bladder; nephrectomy for calculi/also uterine , TAH & BSO  . Aneurysm Mother        thoracic  . Depression Mother   . Stroke Brother   . Heart failure Brother   . Heart attack Other        maternal family history  . Heart failure Maternal Grandfather   . Lung cancer Father        smoked  . Heart failure Sister   . Depression Sister   . Alzheimer's disease Sister   . COPD Sister   . Aneurysm Brother        cns  . Depression Maternal Aunt        X2  . Bipolar disorder Sister   . Alcoholism Neg Hx     Allergies  Allergen Reactions  . Lisinopril     Cough D/Ced by Dr Melvyn Novas  . Codeine     nausea  . Verapamil     Pain in feet    Medication list has been reviewed and updated.  Current Outpatient Medications on File Prior to Visit  Medication Sig Dispense Refill  . acetaminophen-codeine (TYLENOL #3) 300-30 MG tablet TAKE 1 TO 2 TABLETS BY MOUTH EVERY 8 HOURS AS NEEDED FOR MODERATE PAIN 30 tablet 2  . amLODipine (NORVASC) 5 MG tablet Take 1 tablet (5 mg total) by mouth daily. 90 tablet 3  . hydrOXYzine (ATARAX/VISTARIL) 25 MG tablet Take 0.5-1 tablets (12.5-25 mg total) by  mouth every 6 (six) hours as needed for itching. 20 tablet 0  . Ascorbic Acid (VITAMIN C) POWD Take 1 packet by mouth 2 (two) times daily as needed (cold symptoms). (Patient not taking: Reported on 10/18/2020)    . aspirin EC 81 MG tablet  Take 162 mg by mouth daily. (Patient not taking: Reported on 10/18/2020)    . Benzalkonium Chloride (DIABETIC BASICS HEALTHY FOOT EX) Apply 1 application topically daily as needed (foot pain). OTC diabetic foot cream (Patient not taking: Reported on 10/18/2020)    . Blood Glucose Monitoring Suppl (TRUE METRIX METER) w/Device KIT Use as directed to monitor glucose up to 2 times daily. E11.9 dx code (Patient not taking: Reported on 10/18/2020) 1 kit 0  . Blood Glucose Monitoring Suppl (TRUERESULT BLOOD GLUCOSE) W/DEVICE KIT Use to test blood sugar ICD 10 E11 9 (Patient not taking: Reported on 10/18/2020) 1 each 0  . busPIRone (BUSPAR) 7.5 MG tablet Take 2 tablets (15 mg total) by mouth 2 (two) times daily. May take one 4x a day if preferred (Patient not taking: Reported on 10/18/2020) 360 tablet 3  . CALCIUM PO Take 1 tablet by mouth daily. (Patient not taking: Reported on 10/18/2020)    . Cholecalciferol (VITAMIN D PO) Take 1 tablet by mouth daily. 2000 units (Patient not taking: Reported on 10/18/2020)    . CRANBERRY PO Take 2 capsules by mouth 2 (two) times daily. 60m (Patient not taking: Reported on 10/18/2020)    . ESTRACE VAGINAL 0.1 MG/GM vaginal cream Apply locally every other night at bedtime (Patient not taking: Reported on 10/18/2020)  1  . fluticasone (FLONASE) 50 MCG/ACT nasal spray INSTILL 2 SPRAYS INTO EACH NOSTRIL ONCE DAILY (Patient not taking: Reported on 10/18/2020) 48 g 3  . gabapentin (NEURONTIN) 100 MG capsule Take 1 capsule (100 mg total) by mouth 3 (three) times daily. Take one capsule by mouth three times daily. (Patient not taking: No sig reported) 270 capsule 3  . glucose blood (TRUETEST TEST) test strip Use to test blood sugar ICD 10 E11 9 (Patient not  taking: Reported on 10/18/2020) 100 each 3  . glucose blood test strip Use as instructed to check glucose 2 times daily. E11.9 dx code (Patient not taking: Reported on 10/18/2020) 100 each 12  . hydrocortisone 2.5 % cream Apply topically 2 (two) times daily. (Patient not taking: Reported on 10/18/2020) 30 g 0  . hydrOXYzine (ATARAX/VISTARIL) 10 MG tablet Take 1-2 tablets (10-20 mg total) by mouth 3 (three) times daily as needed for itching. (Patient not taking: No sig reported) 60 tablet 3  . Multiple Vitamins-Minerals (EYE VITAMINS PO) Take by mouth. AREDS2 (Patient not taking: Reported on 10/18/2020)    . predniSONE (DELTASONE) 10 MG tablet Take 20 mg daily for one week, then decease to 10 mg daily until bottle finished (Patient not taking: Reported on 10/18/2020) 30 tablet 0  . triamcinolone (KENALOG) 0.025 % cream APPLY 1 APPLICATION TOPICALLY TWICE DAILY AS NEEDED FOR DRY SKIN ON BACK (Patient not taking: Reported on 10/18/2020) 80 g 1  . TRUEPLUS LANCETS 28G MISC Use to test blood sugar ICD 10 E11 9 (Patient not taking: Reported on 10/18/2020) 100 each 3  . TRUEplus Lancets 33G MISC Use as directed to check glucose up to 2 times daily. E11.9 dx code (Patient not taking: Reported on 10/18/2020) 100 each 3  . Vitamin D, Ergocalciferol, (DRISDOL) 1.25 MG (50000 UNIT) CAPS capsule Take 50,000 Units by mouth once a week. (Patient not taking: Reported on 10/18/2020)     No current facility-administered medications on file prior to visit.    Review of Systems:  As per HPI- otherwise negative.   Physical Examination: Vitals:   10/18/20 1207 10/18/20 1224  BP: (!) 160/70 (!) 145/60  Pulse: 80  Temp: 98.3 F (36.8 C)   SpO2: (!) 89% 95%   Vitals:   10/18/20 1207  Weight: 124 lb 9.6 oz (56.5 kg)  Height: _0  (1.549 m)   Body mass index is 23.54 kg/m. Ideal Body Weight: Weight in (lb) to have BMI = 25: 132  GEN: no acute distress.  Appears her normal self, elderly woman seated in  wheelchair HEENT: Atraumatic, Normocephalic.  Ears and Nose: No external deformity. CV: RRR, No M/G/R. No JVD. No thrill. No extra heart sounds. PULM: CTA B, no wheezes, crackles, rhonchi. No retractions. No resp. distress. No accessory muscle use. ABD: S, NT, ND, +BS. No rebound. No HSM. EXTR: No c/c/e PSYCH: Normally interactive. Conversant.  She is able to get to her feet with some effort.  Her backside appears erythematous and there may be an abscess.  We were able to assist her onto exam table for further evaluation.  She has erythema involving both buttocks and inner thighs, and there is a swollen area on the left inner thigh which appears suspicious for abscess.  However, on examination it is firm and not fluctuant, and is not tender.  I do not suspect we will get pus from this area so we will not put her through an I&D   Assessment and Plan: Essential hypertension - Plan: CBC, Comprehensive metabolic panel  Hyperlipidemia, unspecified hyperlipidemia type  Abnormal TSH - Plan: TSH  Cellulitis of buttock - Plan: amoxicillin-clavulanate (AUGMENTIN) 500-125 MG tablet, fluconazole (DIFLUCAN) 150 MG tablet  Vitamin D deficiency - Plan: Cholecalciferol (VITAMIN D3) 1.25 MG (50000 UT) CAPS, VITAMIN D 25 Hydroxy (Vit-D Deficiency, Fractures)  Woman here today with a couple of concerns.  Her blood pressure is somewhat high but out of control.  For the time being continue amlodipine Labs are pending as above She would like a vitamin D check and supplement refill Her itching is currently improved on prednisone Noted cellulitis of her buttocks.  At this time I do not think an I&D will be beneficial.  We will have her start on Augmentin twice daily, will also treat with fluconazole for any yeast component.  I will be in touch with her pending labs.  I have asked her to keep me posted about her progress as far as her infection  We spent some time today discussing options for them to get more  help.  I suggested moving to assisted living or moving closer to their son.  I spoken to their son personally in the past and he stated they would welcome having his parents moved close to him.  The patient today again states that her son is too busy and that he does not want her to move This visit occurred during the SARS-CoV-2 public health emergency.  Safety protocols were in place, including screening questions prior to the visit, additional usage of staff PPE, and extensive cleaning of exam room while observing appropriate contact time as indicated for disinfecting solutions.    Signed Lamar Blinks, MD   Received her labs, called pt 3/24 Glucose is quite high  Did not reach - will try back later, plan to start januvia 50  CrCl under 40   Called her back, labs ok except for elevated glucose Called in Januvia 50 She is asked to keep me posted about how she is doing  Will send lab letter  Results for orders placed or performed in visit on 10/18/20  CBC  Result Value Ref Range   WBC 13.9 (H)  4.0 - 10.5 K/uL   RBC 4.49 3.87 - 5.11 Mil/uL   Platelets 318.0 150.0 - 400.0 K/uL   Hemoglobin 12.9 12.0 - 15.0 g/dL   HCT 39.1 36.0 - 46.0 %   MCV 86.9 78.0 - 100.0 fl   MCHC 33.0 30.0 - 36.0 g/dL   RDW 14.6 11.5 - 15.5 %  Comprehensive metabolic panel  Result Value Ref Range   Sodium 135 135 - 145 mEq/L   Potassium 5.0 3.5 - 5.1 mEq/L   Chloride 99 96 - 112 mEq/L   CO2 24 19 - 32 mEq/L   Glucose, Bld 375 (H) 70 - 99 mg/dL   BUN 28 (H) 6 - 23 mg/dL   Creatinine, Ser 0.86 0.40 - 1.20 mg/dL   Total Bilirubin 0.9 0.2 - 1.2 mg/dL   Alkaline Phosphatase 74 39 - 117 U/L   AST 17 0 - 37 U/L   ALT 21 0 - 35 U/L   Total Protein 6.9 6.0 - 8.3 g/dL   Albumin 3.9 3.5 - 5.2 g/dL   GFR 60.11 >60.00 mL/min   Calcium 8.9 8.4 - 10.5 mg/dL  TSH  Result Value Ref Range   TSH 1.27 0.35 - 4.50 uIU/mL  VITAMIN D 25 Hydroxy (Vit-D Deficiency, Fractures)  Result Value Ref Range   VITD 61.39 30.00  - 100.00 ng/mL    Creat cleraance is approx 40

## 2020-10-18 NOTE — Patient Instructions (Signed)
It was good to see you today- we are going to treat the infection on your behind with an antibiotic and a yeast pill Take one yeast pill and repeat in a week if needed  I will be in touch with your labs asap  Please consider moving to an assisted living or closer to your son- I don't think you are safe on your own any longer

## 2020-10-19 ENCOUNTER — Ambulatory Visit: Payer: Medicare HMO | Admitting: Family Medicine

## 2020-10-19 MED ORDER — SITAGLIPTIN PHOSPHATE 50 MG PO TABS: 50.0000 mg | ORAL_TABLET | Freq: Every day | ORAL | 3 refills | Status: DC

## 2020-10-19 NOTE — Addendum Note (Signed)
Addended by: Lamar Blinks C on: 10/19/2020 07:47 PM   Modules accepted: Orders

## 2020-11-02 DIAGNOSIS — E785 Hyperlipidemia, unspecified: Secondary | ICD-10-CM | POA: Diagnosis not present

## 2020-11-02 DIAGNOSIS — F0390 Unspecified dementia without behavioral disturbance: Secondary | ICD-10-CM | POA: Diagnosis not present

## 2020-11-02 DIAGNOSIS — Z113 Encounter for screening for infections with a predominantly sexual mode of transmission: Secondary | ICD-10-CM | POA: Diagnosis not present

## 2020-11-02 DIAGNOSIS — E559 Vitamin D deficiency, unspecified: Secondary | ICD-10-CM | POA: Diagnosis not present

## 2020-11-02 DIAGNOSIS — L309 Dermatitis, unspecified: Secondary | ICD-10-CM | POA: Diagnosis not present

## 2020-11-02 DIAGNOSIS — E1169 Type 2 diabetes mellitus with other specified complication: Secondary | ICD-10-CM | POA: Diagnosis not present

## 2020-11-02 DIAGNOSIS — Z85528 Personal history of other malignant neoplasm of kidney: Secondary | ICD-10-CM | POA: Diagnosis not present

## 2020-11-02 DIAGNOSIS — R739 Hyperglycemia, unspecified: Secondary | ICD-10-CM | POA: Diagnosis not present

## 2020-11-02 DIAGNOSIS — I10 Essential (primary) hypertension: Secondary | ICD-10-CM | POA: Diagnosis not present

## 2020-11-02 DIAGNOSIS — L03317 Cellulitis of buttock: Secondary | ICD-10-CM | POA: Diagnosis not present

## 2020-11-02 DIAGNOSIS — D751 Secondary polycythemia: Secondary | ICD-10-CM | POA: Diagnosis not present

## 2020-11-09 ENCOUNTER — Ambulatory Visit: Payer: Medicare HMO | Admitting: Pharmacist

## 2020-11-09 DIAGNOSIS — I10 Essential (primary) hypertension: Secondary | ICD-10-CM

## 2020-11-09 DIAGNOSIS — L309 Dermatitis, unspecified: Secondary | ICD-10-CM

## 2020-11-09 DIAGNOSIS — E785 Hyperlipidemia, unspecified: Secondary | ICD-10-CM

## 2020-11-09 DIAGNOSIS — E119 Type 2 diabetes mellitus without complications: Secondary | ICD-10-CM

## 2020-11-09 NOTE — Chronic Care Management (AMB) (Signed)
Care Management   Pharmacy Note  11/09/2020 Name: DEMESHIA [name] MRN: [id number] DOB: 1930-04-08  Subjective: Alexa [name] is a 87 y.o. year old female who is a primary care patient of Sueanne Margarita, DO. The Care Management team was consulted for assistance with care management and care coordination needs.    Engaged with patient by telephone for follow up visit in response to provider referral for pharmacy case management and/or care coordination services.   The patient was given information about Care Management services today including:  1. Care Management services includes personalized support from designated clinical staff supervised by the patient's primary care provider, including individualized plan of care and coordination with other care providers. 2. 24/7 contact phone numbers for assistance for urgent and routine care needs. 3. The patient may stop case management services at any time by phone call to the office staff.  Patient agreed to services and consent obtained.  Assessment:  Review of patient status, including review of consultants reports, laboratory and other test data, was performed as part of comprehensive evaluation and provision of chronic care management services.   SDOH (Social Determinants of Health) assessments and interventions performed:    Objective:  Lab Results  Component Value Date   CREATININE 0.86 10/18/2020   CREATININE 1.12 (H) 07/21/2020   CREATININE 0.87 06/26/2020    Lab Results  Component Value Date   HGBA1C 6.8 (H) 11/29/2019       Component Value Date/Time   CHOL 186 12/16/2018 1157   TRIG 184.0 (H) 12/16/2018 1157   HDL 42.90 12/16/2018 1157   CHOLHDL 4 12/16/2018 1157   VLDL 36.8 12/16/2018 1157   LDLCALC 106 (H) 12/16/2018 1157    Other: (TSH, CBC, Vit D, etc.)  Clinical ASCVD: No  The ASCVD Risk score Mikey Bussing DC Jr., et al., 2013) failed to calculate for the following reasons:   The 2013 ASCVD risk score is only valid  for ages 88 to 55    Other: (CHADS2VASc if Afib, PHQ9 if depression, MMRC or CAT for COPD, ACT, DEXA)  BP Readings from Last 3 Encounters:  10/18/20 (!) 145/60  07/21/20 (!) 167/100  07/19/20 (!) 144/96    Care Plan  Allergies  Allergen Reactions  . Lisinopril     Cough D/Ced by Dr Melvyn Novas  . Codeine     nausea  . Verapamil     Pain in feet    Medications Reviewed Today    Reviewed by Cherre Robins, PharmD (Pharmacist) on 11/09/20 at [place] List Status: <None>  Medication Order Taking? Sig Documenting Provider Last Dose Status Informant  acetaminophen-codeine (TYLENOL #3) 300-30 MG tablet [id number] No TAKE 1 TO 2 TABLETS BY MOUTH EVERY 8 HOURS AS NEEDED FOR MODERATE PAIN  Patient not taking: Reported on 11/09/2020   Copland, Gay Filler, MD Not Taking Consider Medication Status and Discontinue   amLODipine (NORVASC) 5 MG tablet [id number]  Take 1 tablet (5 mg total) by mouth daily. Copland, Gay Filler, MD  Active   Ascorbic Acid (VITAMIN C) POWD [id number] No Take 1 packet by mouth 2 (two) times daily as needed (cold symptoms).  Patient not taking: No sig reported   [provider] Not Taking Active   aspirin EC 81 MG tablet [id number] No Take 162 mg by mouth daily.  Patient not taking: No sig reported   [provider] Not Taking Active   Benzalkonium Chloride (DIABETIC BASICS HEALTHY FOOT EX) [id number] No Apply 1 application topically daily  as needed (foot pain). OTC diabetic foot cream  Patient not taking: No sig reported   [provider] Not Taking Active   Blood Glucose Monitoring Suppl (TRUE METRIX METER) w/Device KIT [id number] Yes Use as directed to monitor glucose up to 2 times daily. E11.9 dx code Copland, Gay Filler, MD Taking Active   busPIRone (BUSPAR) 7.5 MG tablet [id number] No Take 2 tablets (15 mg total) by mouth 2 (two) times daily. May take one 4x a day if preferred  Patient not taking: No sig reported   Copland, Gay Filler, MD Not Taking  Active            Med Note Norva Pavlov Aug 08, 2020 10:03 AM) As needed  CALCIUM PO [id number] No Take 1 tablet by mouth daily.  Patient not taking: No sig reported   [provider] Not Taking Active            Med Note Quinn Axe Jan 03, 2020  1:41 PM) Marena Chancy of dosage  Cholecalciferol (VITAMIN D3) 1.25 MG (50000 UT) CAPS [id number] Yes Take 1 weekly for 12 weeks Copland, Gay Filler, MD Taking Active   CRANBERRY PO [id number] No Take 2 capsules by mouth 2 (two) times daily. 78m  Patient not taking: No sig reported   [provider] Not Taking Active   ESTRACE VAGINAL 0.1 MG/GM vaginal cream [id number] Apply locally every other night at bedtime  Patient not taking: No sig reported   [provider] Not Taking Active            Med Note (CANTER, KVilma PraderD   Thu Dec 26, 2016  8:53 AM)    fluticasone (FLONASE) 50 MCG/ACT nasal spray [id number] INSTILL 2 SPRAYS INTO EACH NOSTRIL ONCE DAILY  Patient not taking: No sig reported   Copland, JGay Filler MD Not Taking Active   gabapentin (NEURONTIN) 100 MG capsule [id number] Take 1 capsule (100 mg total) by mouth 3 (three) times daily. Take one capsule by mouth three times daily.  Patient not taking: No sig reported   Copland, JGay Filler MD Not Taking Active   glucose blood (TRUETEST TEST) test strip [id number] Use to test blood sugar ICD 10 E11 9  Patient not taking: Reported on 10/18/2020   HHendricks Limes MD  Active Self  hydrOXYzine (ATARAX/VISTARIL) 10 MG tablet [id number] Take 1-2 tablets (10-20 mg total) by mouth 3 (three) times daily as needed for itching.  Patient not taking: No sig reported   Copland, JGay Filler MD Not Taking Active   Multiple Vitamins-Minerals (EYE VITAMINS PO) [id number] Take by mouth. AREDS2  Patient not taking: Reported on 10/18/2020   [provider]  Active Self  predniSONE (DELTASONE) 10 MG tablet [id number] Take 20 mg daily for one week, then decease to  10 mg daily until bottle finished  Patient not taking: No sig reported   Copland, JGay Filler MD Not Taking Active   sitaGLIPtin (JANUVIA) 50 MG tablet [id number] Take 1 tablet (50 mg total) by mouth daily. Copland, JGay Filler MD Taking Active   triamcinolone (KENALOG) 0.025 % cream [id number] APPLY 1 APPLICATION TOPICALLY TWICE DAILY AS NEEDED FOR DRY SKIN ON BACK Copland, JGay Filler MD Taking Active   TRUEplus Lancets 33G [id number] Use as directed to check glucose up to 2 times daily. E11.9 dx code Copland, JGay Filler MD Taking Active  Patient Active Problem List   Diagnosis Date Noted  . Solitary kidney 12/15/2018  . Cough variant asthma 12/29/2014  . Spinal stenosis in cervical region 12/13/2014  . Radiculopathy, lumbar region 12/13/2014  . Depression 05/03/2008  . PERONEAL NEUROPATHY 05/03/2008  . Malignant neoplasm of kidney excluding renal pelvis ([place]) 08/06/2007  . Controlled type 2 diabetes mellitus without complication, without long-term current use of insulin ([place]) 08/06/2007  . GERD 08/06/2007  . PULMONARY EMBOLISM, HX OF 08/06/2007  . Hyperlipidemia 09/03/2006  . Essential hypertension 09/03/2006  . IBS 09/03/2006    Conditions to be addressed/monitored: HTN, HLD, DMII and Anxiety  Patient was called for follow up CCM visit, however her chart noted that she had recently seen PCP Sueanne Margarita at [facility]  I spoke with patient to determine if she had changed PCP. At first she was a little vague about if she was truly planning to change PCP's. Then she said that she did have a follow up with in May with Dr Francesco Sor and the clinical pharmacist there for CCM.  She had questions about restarting valsartan but I recommended that she should consult with Dr Francesco Sor if he is taking over as her PCP.  Patient will call his office for consultation.   Medication Assistance:  None required.  Patient affirms current coverage meets  needs.  Follow Up:  Patient requests no follow-up at this time.  Plan: PCP change - no further follow up planned.  Cherre Robins, PharmD Clinical Pharmacist [facility]

## 2020-11-13 DIAGNOSIS — I10 Essential (primary) hypertension: Secondary | ICD-10-CM | POA: Diagnosis not present

## 2020-11-13 DIAGNOSIS — E1169 Type 2 diabetes mellitus with other specified complication: Secondary | ICD-10-CM | POA: Diagnosis not present

## 2020-11-13 DIAGNOSIS — L309 Dermatitis, unspecified: Secondary | ICD-10-CM | POA: Diagnosis not present

## 2020-11-16 ENCOUNTER — Encounter (HOSPITAL_COMMUNITY): Payer: Self-pay

## 2020-11-16 ENCOUNTER — Emergency Department (HOSPITAL_COMMUNITY)
Admission: EM | Admit: 2020-11-16 | Discharge: 2020-11-16 | Disposition: A | Discharge status: Home / Self Care | Payer: Medicare HMO | Attending: Emergency Medicine | Admitting: Emergency Medicine

## 2020-11-16 ENCOUNTER — Other Ambulatory Visit: Payer: Self-pay

## 2020-11-16 DIAGNOSIS — I10 Essential (primary) hypertension: Secondary | ICD-10-CM | POA: Diagnosis not present

## 2020-11-16 DIAGNOSIS — Z79899 Other long term (current) drug therapy: Secondary | ICD-10-CM | POA: Diagnosis not present

## 2020-11-16 DIAGNOSIS — E119 Type 2 diabetes mellitus without complications: Secondary | ICD-10-CM | POA: Diagnosis not present

## 2020-11-16 DIAGNOSIS — Z85528 Personal history of other malignant neoplasm of kidney: Secondary | ICD-10-CM | POA: Diagnosis not present

## 2020-11-16 DIAGNOSIS — Z7982 Long term (current) use of aspirin: Secondary | ICD-10-CM | POA: Insufficient documentation

## 2020-11-16 DIAGNOSIS — R21 Rash and other nonspecific skin eruption: Secondary | ICD-10-CM | POA: Diagnosis not present

## 2020-11-16 DIAGNOSIS — J45909 Unspecified asthma, uncomplicated: Secondary | ICD-10-CM | POA: Insufficient documentation

## 2020-11-16 DIAGNOSIS — J449 Chronic obstructive pulmonary disease, unspecified: Secondary | ICD-10-CM | POA: Insufficient documentation

## 2020-11-16 MED ORDER — PREDNISONE 10 MG PO TABS: ORAL_TABLET | ORAL | 0 refills | Status: DC

## 2020-11-16 NOTE — ED Notes (Signed)
An After Visit Summary was printed and given to the patient. Discharge instructions given and no further questions at this time.  Pt leaving with her husband, pt states she is fine to drive. Pt ambulatory, A&Ox4.

## 2020-11-16 NOTE — ED Provider Notes (Signed)
[facility] DEPT Provider Note   CSN: [id number] Arrival date & time: 11/16/20  1316     History Chief Complaint  Patient presents with  . Allergic Reaction    Alexa [name] is a 87 y.o. female past with history of asthma, COPD, malignant neoplasm of the kidney, skin irritation who presents to the emergency department for evaluation of skin redness, irritation.  She states this has been an ongoing issue for the last 2 years.  She has had multiple doctors visits to determine what is the cause and states that nobody has figured out what is causing it.  She states that she has seen dermatology with no conclusive finding.  She states that sometimes she gets tramadol for her symptoms.  He states she will occasionally have burning of her skin and states that the tramadol helps.  She also states that her ears have been dry, swollen.  She states that this is not new.  She probably saw her PCP on Monday and states that they did not do anything for her so she came here.  He denies any fevers, difficulty breathing, vomiting.  The history is provided by the patient.       Past Medical History:  Diagnosis Date  . Asthma   . Chronic obstructive pulmonary disease ([place])   . Diverticulosis   . Diverticulosis of colon (without mention of hemorrhage)   . Esophageal reflux   . History of colonic polyps 06/05/05   hyperplastic  . Internal hemorrhoids   . Irritable bowel syndrome   . Ischemic colitis ([place])   . Malignant neoplasm of kidney and other and unspecified urinary organs    renal carcinoma, left nephrectomy in 2008-per patient.  . Other and unspecified hyperlipidemia   . Personal history of venous thrombosis and embolism    PTE after nephrectomy  . Spinal stenosis   . Type II or unspecified type diabetes mellitus without mention of complication, not stated as uncontrolled 02/05/2012   pt states it was in 2007  . Unspecified essential hypertension     Patient  Active Problem List   Diagnosis Date Noted  . Solitary kidney 12/15/2018  . Cough variant asthma 12/29/2014  . Spinal stenosis in cervical region 12/13/2014  . Radiculopathy, lumbar region 12/13/2014  . Depression 05/03/2008  . PERONEAL NEUROPATHY 05/03/2008  . Malignant neoplasm of kidney excluding renal pelvis ([place]) 08/06/2007  . Controlled type 2 diabetes mellitus without complication, without long-term current use of insulin ([place]) 08/06/2007  . GERD 08/06/2007  . PULMONARY EMBOLISM, HX OF 08/06/2007  . Hyperlipidemia 09/03/2006  . Essential hypertension 09/03/2006  . IBS 09/03/2006    Past Surgical History:  Procedure Laterality Date  . BREAST BIOPSY    . COLONOSCOPY W/ POLYPECTOMY  2000  . COLONOSCOPY W/ POLYPECTOMY  07/2004   Hyperplastic polyps  . DILATION AND CURETTAGE OF UTERUS  03/2005   Uterine Polyps  . NEPHRECTOMY  2007   right,Dr Dahlstedt  . SEPTOPLASTY    . TONSILLECTOMY       OB History   No obstetric history on file.     Family History  Problem Relation Age of Onset  . Stroke Mother        onset in 53s  . Diabetes Mother   . Cancer Mother        bladder; nephrectomy for calculi/also uterine , TAH & BSO  . Aneurysm Mother        thoracic  . Depression Mother   .  Stroke Brother   . Heart failure Brother   . Heart attack Other        maternal family history  . Heart failure Maternal Grandfather   . Lung cancer Father        smoked  . Heart failure Sister   . Depression Sister   . Alzheimer's disease Sister   . COPD Sister   . Aneurysm Brother        cns  . Depression Maternal Aunt        X2  . Bipolar disorder Sister   . Alcoholism Neg Hx     Social History   Tobacco Use  . Smoking status: Never Smoker  . Smokeless tobacco: Never Used  Vaping Use  . Vaping Use: Never used  Substance Use Topics  . Alcohol use: No  . Drug use: No    Home Medications Prior to Admission medications   Medication Sig Start Date End Date Taking?  Authorizing Provider  predniSONE (DELTASONE) 10 MG tablet Take 4 tablets by mouth for 2 days, Take 3 tablets by mouth for 2 days. Take 2 tablets by mouth for 2 days. Take one tabled by mouth for 1 day. 11/16/20  Yes Providence Lanius A, PA-C  acetaminophen-codeine (TYLENOL #3) 300-30 MG tablet TAKE 1 TO 2 TABLETS BY MOUTH EVERY 8 HOURS AS NEEDED FOR MODERATE PAIN Patient not taking: Reported on 11/09/2020 08/31/20   Copland, Gay Filler, MD  amLODipine (NORVASC) 5 MG tablet Take 1 tablet (5 mg total) by mouth daily. 07/27/20   Copland, Gay Filler, MD  Ascorbic Acid (VITAMIN C) POWD Take 1 packet by mouth 2 (two) times daily as needed (cold symptoms). Patient not taking: No sig reported    [provider]  aspirin EC 81 MG tablet Take 162 mg by mouth daily. Patient not taking: No sig reported    [provider]  Benzalkonium Chloride (DIABETIC BASICS HEALTHY FOOT EX) Apply 1 application topically daily as needed (foot pain). OTC diabetic foot cream Patient not taking: No sig reported    [provider]  Blood Glucose Monitoring Suppl (TRUE METRIX METER) w/Device KIT Use as directed to monitor glucose up to 2 times daily. E11.9 dx code 10/04/20   Copland, Gay Filler, MD  busPIRone (BUSPAR) 7.5 MG tablet Take 2 tablets (15 mg total) by mouth 2 (two) times daily. May take one 4x a day if preferred Patient not taking: No sig reported 04/28/20   Copland, Gay Filler, MD  CALCIUM PO Take 1 tablet by mouth daily. Patient not taking: No sig reported    [provider]  Cholecalciferol (VITAMIN D3) 1.25 MG (50000 UT) CAPS Take 1 weekly for 12 weeks 10/18/20   Copland, Gay Filler, MD  CRANBERRY PO Take 2 capsules by mouth 2 (two) times daily. 20m Patient not taking: No sig reported    [provider]  ESTRACE VAGINAL 0.1 MG/GM vaginal cream Apply locally every other night at bedtime Patient not taking: No sig reported 01/16/16   [provider]  fluticasone (FLONASE) 50  MCG/ACT nasal spray INSTILL 2 SPRAYS INTO EACH NOSTRIL ONCE DAILY Patient not taking: No sig reported 04/28/20   Copland, JGay Filler MD  gabapentin (NEURONTIN) 100 MG capsule Take 1 capsule (100 mg total) by mouth 3 (three) times daily. Take one capsule by mouth three times daily. Patient not taking: No sig reported 04/28/20   Copland, JGay Filler MD  glucose blood (TRUETEST TEST) test strip Use to test  blood sugar ICD 10 E11 9 07/14/14   Hendricks Limes, MD  hydrOXYzine (ATARAX/VISTARIL) 10 MG tablet Take 1-2 tablets (10-20 mg total) by mouth 3 (three) times daily as needed for itching. Patient not taking: No sig reported 07/12/20   Copland, Gay Filler, MD  Multiple Vitamins-Minerals (EYE VITAMINS PO) Take by mouth. AREDS2 Patient not taking: No sig reported    [provider]  sitaGLIPtin (JANUVIA) 50 MG tablet Take 1 tablet (50 mg total) by mouth daily. 10/19/20   Copland, Gay Filler, MD  triamcinolone (KENALOG) 0.025 % cream APPLY 1 APPLICATION TOPICALLY TWICE DAILY AS NEEDED FOR DRY SKIN ON BACK 04/28/20   Copland, Gay Filler, MD  TRUEplus Lancets 33G MISC Use as directed to check glucose up to 2 times daily. E11.9 dx code 10/04/20   Copland, Gay Filler, MD    Allergies    Lisinopril, Codeine, and Verapamil  Review of Systems   Review of Systems  Constitutional: Negative for fever.  Respiratory: Negative for shortness of breath.   Skin: Positive for rash.  All other systems reviewed and are negative.   Physical Exam Updated Vital Signs BP (!) 146/81 (BP Location: Left Arm)   Pulse 85   Temp 97.7 F (36.5 C) (Oral)   Resp 18   Ht 5' 2.5" (1.588 m)   Wt 55.3 kg   SpO2 94%   BMI 21.96 kg/m   Physical Exam Vitals and nursing note reviewed.  Constitutional:      Appearance: She is well-developed.  HENT:     Head: Normocephalic and atraumatic.  Eyes:     General: No scleral icterus.       Right eye: No discharge.        Left eye: No discharge.     Conjunctiva/sclera:  Conjunctivae normal.  Pulmonary:     Effort: Pulmonary effort is normal.     Comments: Lungs clear to auscultation bilaterally.  Symmetric chest rise.  No wheezing, rales, rhonchi. Skin:    General: Skin is warm and dry.     Comments: Diffuse erythema, dry scaling skin noted to bilateral upper extremities, bilateral lower extremities, torso.  No overlying warmth, induration.  Neurological:     Mental Status: She is alert.  Psychiatric:        Speech: Speech normal.        Behavior: Behavior normal.     ED Results / Procedures / Treatments   Labs (all labs ordered are listed, but only abnormal results are displayed) Labs Reviewed - No data to display  EKG None  Radiology No results found.  Procedures Procedures   Medications Ordered in ED Medications - No data to display  ED Course  I have reviewed the triage vital signs and the nursing notes.  Pertinent labs & imaging results that were available during my care of the patient were reviewed by me and considered in my medical decision making (see chart for details).    MDM Rules/Calculators/A&P                          87 year old female who presents for evaluation of redness, skin irritation.  She states this has been an ongoing issue for 2 years.  She sees routinely sees her primary care doctor, dermatology with no conclusive findings as to what is causing her symptoms.  She reports she normally takes tramadol to help with the burning and ran out.  She saw her PCP on  Monday and states that they did not do anything for her which is what prompted her to come to the emergency department.  She denies any new or changes in symptoms.  She states that she has difficulty breathing.  She has not started any medication.  I reviewed her record.  She has had multiple visits for this before.  Dermatology thinks there might be some contact dermatitis that is playing a part in it.  Do not see where she is got any tramadol.  She states that  none of her symptoms are new and have been ongoing for the last 2 years.  Patient with no evidence of infectious etiology.  At this time, do not feel that tramadol is appropriate given this particular instance.  I discussed with Dr. Roslynn Amble who evaluated patient.  Will give short course of prednisone taper to help with symptoms and patient instructed follow-up with her primary care doctor. At this time, patient exhibits no emergent life-threatening condition that require further evaluation in ED. Patient had ample opportunity for questions and discussion. All patient's questions were answered with full understanding. Strict return precautions discussed. Patient expresses understanding and agreement to plan.   Portions of this note were generated with [employer]. Dictation errors may occur despite best attempts at proofreading.   Final Clinical Impression(s) / ED Diagnoses Final diagnoses:  Rash    Rx / DC Orders ED Discharge Orders         Ordered    predniSONE (DELTASONE) 10 MG tablet        11/16/20 1501           Desma Mcgregor 11/16/20 2026    Lucrezia Starch, MD 11/16/20 2223

## 2020-11-16 NOTE — Discharge Instructions (Signed)
Follow up with your primary care doctor.   Take prednisone as directed.  Return to the ER for any worsening rash, difficulty breathing, fevers or any other worsening or concerning symptoms.

## 2020-11-16 NOTE — ED Triage Notes (Signed)
Patient c/o itching and burning on entire body. Patient is extremely red x 2 years. Patient states she ran out of her tramadol a week ago. Patient states the Tramadol helped her itching.  Patient states she is scheduled to see an Allergist in 4 days. patient has swelling to her ears

## 2020-11-20 ENCOUNTER — Other Ambulatory Visit: Payer: Self-pay

## 2020-11-20 ENCOUNTER — Ambulatory Visit (INDEPENDENT_AMBULATORY_CARE_PROVIDER_SITE_OTHER): Payer: Medicare HMO | Admitting: Allergy

## 2020-11-20 ENCOUNTER — Encounter: Payer: Self-pay | Admitting: Allergy

## 2020-11-20 VITALS — BP 136/68 | HR 87 | Temp 96.7°F | Resp 14 | Ht 60.0 in | Wt 125.8 lb

## 2020-11-20 DIAGNOSIS — L299 Pruritus, unspecified: Secondary | ICD-10-CM

## 2020-11-20 DIAGNOSIS — J3089 Other allergic rhinitis: Secondary | ICD-10-CM

## 2020-11-20 DIAGNOSIS — R609 Edema, unspecified: Secondary | ICD-10-CM | POA: Diagnosis not present

## 2020-11-20 DIAGNOSIS — T781XXD Other adverse food reactions, not elsewhere classified, subsequent encounter: Secondary | ICD-10-CM

## 2020-11-20 DIAGNOSIS — R21 Rash and other nonspecific skin eruption: Secondary | ICD-10-CM

## 2020-11-20 MED ORDER — FAMOTIDINE 20 MG PO TABS: 20.0000 mg | ORAL_TABLET | Freq: Every day | ORAL | 2 refills | Status: DC

## 2020-11-20 MED ORDER — CETIRIZINE HCL 10 MG PO TABS: ORAL_TABLET | ORAL | 2 refills | Status: DC

## 2020-11-20 NOTE — Patient Instructions (Addendum)
Skin:   See below for proper skin care.  Make sure to moisturizer daily. Samples given.    Take zyrtec 10mg  - 1/2 tablet to 1 tablet daily at night.  If it makes you too tired let us know.   Take famotidine 20mg  daily at night.    STOP Claritin  STOP hydroxyzine  STOP Benadryl.   . Get bloodwork:  o We are ordering labs, so please allow 1-2 weeks for the results to come back. o With the newly implemented Cures Act, the labs might be visible to you at the same time that they become visible to me. However, I will not address the results until all of the results are back, so please be patient.   Follow up in 1 months or sooner if needed.   Skin care recommendations  Bath time: . Always use lukewarm water. AVOID very hot or cold water. Marland Kitchen Keep bathing time to 5-10 minutes. . Do NOT use bubble bath. . Use a mild soap and use just enough to wash the dirty areas. . Do NOT scrub skin vigorously.  . After bathing, pat dry your skin with a towel. Do NOT rub or scrub the skin.  Moisturizers and prescriptions:  . ALWAYS apply moisturizers immediately after bathing (within 3 minutes). This helps to lock-in moisture. . Use the moisturizer several times a day over the whole body. Kermit Balo summer moisturizers include: Aveeno, CeraVe, Cetaphil. Kermit Balo winter moisturizers include: Aquaphor, Vaseline, Cerave, Cetaphil, Eucerin, Vanicream. . When using moisturizers along with medications, the moisturizer should be applied about one hour after applying the medication to prevent diluting effect of the medication or moisturize around where you applied the medications. When not using medications, the moisturizer can be continued twice daily as maintenance.  Laundry and clothing: . Avoid laundry products with added color or perfumes. . Use unscented hypo-allergenic laundry products such as Tide free, Cheer free & gentle, and All free and clear.  . If the skin still seems dry or sensitive, you can  try double-rinsing the clothes. . Avoid tight or scratchy clothing such as wool. . Do not use fabric softeners or dyer sheets.

## 2020-11-20 NOTE — Progress Notes (Signed)
New Patient Note  RE: Alexa [name] MRN: [id number] DOB: 1930-01-20 Date of Office Visit: 11/20/2020  Consult requested by: Sueanne Margarita, DO Primary care provider: Sueanne Margarita, DO  Chief Complaint: Allergy Testing  History of Present Illness: I had the pleasure of seeing Alexa [name] for initial evaluation at the [facility] on 11/21/2020. She is a 87 y.o. female, who is referred here by Sueanne Margarita, DO for the evaluation of rash.  She is accompanied today by her spouse.  Itching and rash started about 2 years ago. This can occur anywhere on her body. Describes them as itchy, red, raised. Patient states this all started after she moved to a motel that was dog friendly for 3 months after a tree fell onto their house. But now she is back at home but the rash has not resolved.   The itching/rash is daily and worse at night.    Individual rashes lasts about 1 week. Associated symptoms include: fevers/chills, aching. Suspected triggers are unknown. Denies any  changes in medications, foods, personal care products or recent infections. She has tried the following therapies: triamcinolone, prednisone with good benefit. Systemic steroids yes. Currently on prednisone 58m BID x 1 week.  Previous work up includes: 2022 - TSH, CBC (elevated WBC), CMP (elevated glucose), ANA negative. Saw dermatology - had skin biopsy "Patient is already seen a dermatologist then has a diagnosis of perivascular dermatitis with eosinophilia."  Previous history of rash/hives: not sure. Patient is up to date with the following cancer screening tests: malignant neoplasm of the kidney (left kidney removed).  Currently using free and clear products with no improvement.  11/16/2020 ER visit: "Alexa [name] is a 87y.o. female past with history of asthma, COPD, malignant neoplasm of the kidney, skin irritation who presents to the emergency department for evaluation of skin redness, irritation.   She states this has been an ongoing issue for the last 2 years.  She has had multiple doctors visits to determine what is the cause and states that nobody has figured out what is causing it.  She states that she has seen dermatology with no conclusive finding.  She states that sometimes she gets tramadol for her symptoms.  He states she will occasionally have burning of her skin and states that the tramadol helps.  She also states that her ears have been dry, swollen.  She states that this is not new.  She probably saw her PCP on Monday and states that they did not do anything for her so she came here.  He denies any fevers, difficulty breathing, vomiting."  Assessment and Plan: [name] a 87y.o. female with: Rash and other nonspecific skin eruption Pruritic rash for 2 years which only improves with prednisone. No triggers noted but it all appeared after she stayed in a motel for 3 months while her current home was undergoing renovation after a tree fell on it. Saw dermatology and biopsy showed perivascular dermatitis with eosinophilia. 2022 bloodwork - TSH, CBC (elevated WBC), CMP (elevated glucose), ANA negative. Denies changes in diet, meds or personal care products.   Given above clinical history, not sure what's causing the rash - given distribution some concern for contact irritation and xerosis contributing to her symptoms.   See below for proper skin care.  Make sure to moisturizer daily. Samples given.   Take zyrtec 144m- 1/2 tablet to 1 tablet daily at night.  If it makes you too tired let  us know.   Take famotidine 87m daily at night.   STOP Claritin  STOP hydroxyzine  STOP Benadryl . Get bloodwork to rule out other etiologies. . If no improvement, consider a trial of Dupixent next.  Return in about 4 weeks (around 12/18/2020).  Meds ordered this encounter  Medications  . cetirizine (ZYRTEC ALLERGY) 10 MG tablet    Sig: Take 1/2 tablet to 1 tablet daily.    Dispense:  30  tablet    Refill:  2  . famotidine (PEPCID) 20 MG tablet    Sig: Take 1 tablet (20 mg total) by mouth daily.    Dispense:  30 tablet    Refill:  2    Lab Orders     Allergens w/Total IgE Area 2     Alpha-Gal Panel     C3 and C4     C1 esterase inhibitor, functional     C1 Esterase Inhibitor     Chronic Urticaria     C-reactive protein     Tryptase     Sedimentation rate     Food Allergy Profile  Other allergy screening: Asthma: yes as a young adult. Denies any SOB, coughing, wheezing, chest tightness, nocturnal awakenings.  No inhaler use.  Rhino conjunctivitis: yes  Rhinorrhea at times and takes otc antihistamines and Flonase with some benefit. Used to be on allergy injections in the past with good benefit.  Food allergy: yes  V8 tomato juice causes itching. Medication allergy: yes Hymenoptera allergy: no History of recurrent infections suggestive of immunodeficency: no  Diagnostics: None.  Past Medical History: Patient Active Problem List   Diagnosis Date Noted  . Rash and other nonspecific skin eruption 11/21/2020  . Adverse reaction to food, subsequent encounter 11/21/2020  . Other allergic rhinitis 11/21/2020  . Pruritus 11/21/2020  . Solitary kidney 12/15/2018  . Cough variant asthma 12/29/2014  . Spinal stenosis in cervical region 12/13/2014  . Radiculopathy, lumbar region 12/13/2014  . Depression 05/03/2008  . PERONEAL NEUROPATHY 05/03/2008  . Malignant neoplasm of kidney excluding renal pelvis ([facility] 08/06/2007  . Controlled type 2 diabetes mellitus without complication, without long-term current use of insulin ([place] 08/06/2007  . GERD 08/06/2007  . PULMONARY EMBOLISM, HX OF 08/06/2007  . Hyperlipidemia 09/03/2006  . Essential hypertension 09/03/2006  . IBS 09/03/2006   Past Medical History:  Diagnosis Date  . Asthma   . Chronic obstructive pulmonary disease ([place]   . Diverticulosis   . Diverticulosis of colon (without mention of hemorrhage)    . Esophageal reflux   . History of colonic polyps 06/05/05   hyperplastic  . Internal hemorrhoids   . Irritable bowel syndrome   . [place]schemic colitis ([place]   . Malignant neoplasm of kidney and other and unspecified urinary organs    renal carcinoma, left nephrectomy in 2008-per patient.  . Other and unspecified hyperlipidemia   . Personal history of venous thrombosis and embolism    PTE after nephrectomy  . Spinal stenosis   . Type II or unspecified type diabetes mellitus without mention of complication, not stated as uncontrolled 02/05/2012   pt states it was in 2007  . Unspecified essential hypertension    Past Surgical History: Past Surgical History:  Procedure Laterality Date  . BREAST BIOPSY    . COLONOSCOPY W/ POLYPECTOMY  2000  . COLONOSCOPY W/ POLYPECTOMY  07/2004   Hyperplastic polyps  . DILATION AND CURETTAGE OF UTERUS  03/2005   Uterine Polyps  . NEPHRECTOMY  2007  right,Dr Dahlstedt  . SEPTOPLASTY    . TONSILLECTOMY     Medication List:  Current Outpatient Medications  Medication Sig Dispense Refill  . acetaminophen-codeine (TYLENOL #3) 300-30 MG tablet TAKE 1 TO 2 TABLETS BY MOUTH EVERY 8 HOURS AS NEEDED FOR MODERATE PAIN 30 tablet 2  . amLODipine (NORVASC) 5 MG tablet Take 1 tablet (5 mg total) by mouth daily. 90 tablet 3  . Ascorbic Acid (VITAMIN C) POWD Take 1 packet by mouth 2 (two) times daily as needed (cold symptoms).    Marland Kitchen aspirin EC 81 MG tablet Take 162 mg by mouth daily.    . Benzalkonium Chloride (DIABETIC BASICS HEALTHY FOOT EX) Apply 1 application topically daily as needed (foot pain). OTC diabetic foot cream    . Blood Glucose Monitoring Suppl (TRUE METRIX METER) w/Device KIT Use as directed to monitor glucose up to 2 times daily. E11.9 dx code 1 kit 0  . CALCIUM PO Take 1 tablet by mouth daily.    . cetirizine (ZYRTEC ALLERGY) 10 MG tablet Take 1/2 tablet to 1 tablet daily. 30 tablet 2  . Cholecalciferol (VITAMIN D3) 1.25 MG (50000 UT) CAPS Take 1  weekly for 12 weeks 12 capsule 0  . CRANBERRY PO Take 2 capsules by mouth 2 (two) times daily. 101m    . ESTRACE VAGINAL 0.1 MG/GM vaginal cream Apply locally every other night at bedtime  1  . famotidine (PEPCID) 20 MG tablet Take 1 tablet (20 mg total) by mouth daily. 30 tablet 2  . fluticasone (FLONASE) 50 MCG/ACT nasal spray INSTILL 2 SPRAYS INTO EACH NOSTRIL ONCE DAILY 48 g 3  . glucose blood (TRUETEST TEST) test strip Use to test blood sugar ICD 10 E11 9 100 each 3  . Multiple Vitamins-Minerals (EYE VITAMINS PO) Take by mouth. AREDS2    . predniSONE (DELTASONE) 10 MG tablet Take 4 tablets by mouth for 2 days, Take 3 tablets by mouth for 2 days. Take 2 tablets by mouth for 2 days. Take one tabled by mouth for 1 day. 20 tablet 0  . sitaGLIPtin (JANUVIA) 50 MG tablet Take 1 tablet (50 mg total) by mouth daily. 30 tablet 3  . TRUEplus Lancets 33G MISC Use as directed to check glucose up to 2 times daily. E11.9 dx code 100 each 3  . busPIRone (BUSPAR) 7.5 MG tablet Take 2 tablets (15 mg total) by mouth 2 (two) times daily. May take one 4x a day if preferred (Patient not taking: No sig reported) 360 tablet 3  . gabapentin (NEURONTIN) 100 MG capsule Take 1 capsule (100 mg total) by mouth 3 (three) times daily. Take one capsule by mouth three times daily. (Patient not taking: No sig reported) 270 capsule 3  . triamcinolone (KENALOG) 0.025 % cream APPLY 1 APPLICATION TOPICALLY TWICE DAILY AS NEEDED FOR DRY SKIN ON BACK (Patient not taking: Reported on 11/20/2020) 80 g 1   No current facility-administered medications for this visit.   Allergies: Allergies  Allergen Reactions  . Lisinopril     Cough D/Ced by Dr WMelvyn Novas . Codeine     nausea  . Verapamil     Pain in feet   Social History: Social History   Socioeconomic History  . Marital status: Married    Spouse name: Not on file  . Number of children: Not on file  . Years of education: Not on file  . Highest education level: Not on file   Occupational History  . Not on file  Tobacco Use  . Smoking status: Never Smoker  . Smokeless tobacco: Never Used  Vaping Use  . Vaping Use: Never used  Substance and Sexual Activity  . Alcohol use: No  . Drug use: No  . Sexual activity: Not on file  Other Topics Concern  . Not on file  Social History Narrative  . Not on file   Social Determinants of Health   Financial Resource Strain: Low Risk   . Difficulty of Paying Living Expenses: Not hard at all  Food Insecurity: Not on file  Transportation Needs: Not on file  Physical Activity: Not on file  Stress: Not on file  Social Connections: Not on file   Lives in a house. Smoking: denies Occupation: not employed.  Environmental History: Water Damage/mildew in the house: no Carpet in the family room: yes Carpet in the bedroom: no Heating: gas Cooling: central Pet: yes 1 dog x 9 yrs  Family History: Family History  Problem Relation Age of Onset  . Stroke Mother        onset in 33s  . Diabetes Mother   . Cancer Mother        bladder; nephrectomy for calculi/also uterine , TAH & BSO  . Aneurysm Mother        thoracic  . Depression Mother   . Stroke Brother   . Heart failure Brother   . Heart attack Other        maternal family history  . Heart failure Maternal Grandfather   . Lung cancer Father        smoked  . Heart failure Sister   . Depression Sister   . Alzheimer's disease Sister   . COPD Sister   . Aneurysm Brother        cns  . Depression Maternal Aunt        X2  . Bipolar disorder Sister   . Alcoholism Neg Hx    Problem                               Relation Asthma                                   Father, sister Eczema                                No  Food allergy                          No Allergic rhino conjunctivitis     No  Review of Systems  Constitutional: Positive for chills and fever. Negative for appetite change and unexpected weight change.  HENT: Negative for congestion and  rhinorrhea.   Eyes: Negative for itching.  Respiratory: Negative for cough, chest tightness, shortness of breath and wheezing.   Cardiovascular: Negative for chest pain.  Gastrointestinal: Positive for constipation. Negative for abdominal pain.  Genitourinary: Negative for difficulty urinating.  Skin: Positive for rash.  Neurological: Negative for headaches.   Objective: BP 136/68 (BP Location: Left Arm, Patient Position: Sitting, Cuff Size: Normal)   Pulse 87   Temp (!) 96.7 F (35.9 C) (Temporal)   Resp 14   Ht 5' (1.524 m)   Wt 125 lb 12.8 oz (57.1 kg)   SpO2 95%  BMI 24.57 kg/m  Body mass index is 24.57 kg/m. Physical Exam Vitals and nursing note reviewed.  Constitutional:      Appearance: Normal appearance. She is well-developed.  HENT:     Head: Normocephalic and atraumatic.     Right Ear: Tympanic membrane and external ear normal.     Left Ear: Tympanic membrane and external ear normal.     Nose: Nose normal.     Mouth/Throat:     Mouth: Mucous membranes are moist.     Pharynx: Oropharynx is clear.  Eyes:     Conjunctiva/sclera: Conjunctivae normal.  Cardiovascular:     Rate and Rhythm: Normal rate and regular rhythm.     Heart sounds: Normal heart sounds. No murmur heard. No friction rub. No gallop.   Pulmonary:     Effort: Pulmonary effort is normal.     Breath sounds: Normal breath sounds. No wheezing, rhonchi or rales.  Musculoskeletal:     Cervical back: Neck supple.  Skin:    General: Skin is warm and dry.     Findings: Rash present.     Comments: Erythematous hue on the upper posterior torso. Dry skin.  Neurological:     Mental Status: She is alert and oriented to person, place, and time.  Psychiatric:        Behavior: Behavior normal.    The plan was reviewed with the patient/family, and all questions/concerned were addressed.  It was my pleasure to see Alexa [name] today and participate in her care. Please feel free to contact me with any questions  or concerns.  Sincerely,  Rexene Alberts, DO Allergy & Immunology  [facility] office: [place] office: [phone]

## 2020-11-21 DIAGNOSIS — T781XXD Other adverse food reactions, not elsewhere classified, subsequent encounter: Secondary | ICD-10-CM | POA: Insufficient documentation

## 2020-11-21 DIAGNOSIS — J3089 Other allergic rhinitis: Secondary | ICD-10-CM | POA: Insufficient documentation

## 2020-11-21 DIAGNOSIS — L299 Pruritus, unspecified: Secondary | ICD-10-CM | POA: Insufficient documentation

## 2020-11-21 DIAGNOSIS — J302 Other seasonal allergic rhinitis: Secondary | ICD-10-CM | POA: Insufficient documentation

## 2020-11-21 DIAGNOSIS — R21 Rash and other nonspecific skin eruption: Secondary | ICD-10-CM | POA: Insufficient documentation

## 2020-11-21 NOTE — Assessment & Plan Note (Signed)
Pruritic rash for 2 years which only improves with prednisone. No triggers noted but it all appeared after she stayed in a motel for 3 months while her current home was undergoing renovation after a tree fell on it. Saw dermatology and biopsy showed perivascular dermatitis with eosinophilia. 2022 bloodwork - TSH, CBC (elevated WBC), CMP (elevated glucose), ANA negative. Denies changes in diet, meds or personal care products.   Given above clinical history, not sure what's causing the rash - given distribution some concern for contact irritation and xerosis contributing to her symptoms.   See below for proper skin care.  Make sure to moisturizer daily. Samples given.   Take zyrtec 10mg  - 1/2 tablet to 1 tablet daily at night.  If it makes you too tired let us know.   Take famotidine 20mg  daily at night.   STOP Claritin  STOP hydroxyzine  STOP Benadryl . Get bloodwork to rule out other etiologies. . If no improvement, consider a trial of Dupixent next.

## 2020-11-30 LAB — FOOD ALLERGY PROFILE
Allergen Corn, IgE: <0.1 kU/L
Clam IgE: <0.1 kU/L
Codfish IgE: <0.1 kU/L
Egg White IgE: 0.22 kU/L — AB
Milk IgE: 14.1 kU/L — AB
Peanut IgE: 0.12 kU/L — AB
Scallop IgE: <0.1 kU/L
Sesame Seed IgE: 0.14 kU/L — AB
Shrimp IgE: <0.1 kU/L
Soybean IgE: 0.12 kU/L — AB
Walnut IgE: <0.1 kU/L
Wheat IgE: 0.39 kU/L — AB

## 2020-11-30 LAB — ALLERGENS W/TOTAL IGE AREA 2
Alternaria Alternata IgE: <0.1 kU/L
Aspergillus Fumigatus IgE: <0.1 kU/L
Bermuda Grass IgE: <0.1 kU/L
Cat Dander IgE: >100 kU/L — AB
Cedar, Mountain IgE: 0.12 kU/L — AB
Cladosporium Herbarum IgE: <0.1 kU/L
Cockroach, German IgE: <0.1 kU/L
Common Silver Birch IgE: <0.1 kU/L
Cottonwood IgE: <0.1 kU/L
D Farinae IgE: <0.1 kU/L
D Pteronyssinus IgE: 0.2 kU/L — AB
Dog Dander IgE: 80.1 kU/L — AB
Elm, American IgE: <0.1 kU/L
Johnson Grass IgE: <0.1 kU/L
Maple/Box Elder IgE: <0.1 kU/L
Mouse Urine IgE: 0.12 kU/L — AB
Oak, White IgE: <0.1 kU/L
Pecan, Hickory IgE: <0.1 kU/L
Penicillium Chrysogen IgE: <0.1 kU/L
Pigweed, Rough IgE: <0.1 kU/L
Ragweed, Short IgE: 0.51 kU/L — AB
Sheep Sorrel IgE Qn: 0.14 kU/L — AB
Timothy Grass IgE: <0.1 kU/L
White Mulberry IgE: <0.1 kU/L

## 2020-11-30 LAB — TRYPTASE: Tryptase: 3.5 ug/L (ref 2.2–13.2)

## 2020-11-30 LAB — C3 AND C4
Complement C3, Serum: 120 mg/dL (ref 82–167)
Complement C4, Serum: 17 mg/dL (ref 12–38)

## 2020-11-30 LAB — SEDIMENTATION RATE: Sed Rate: 26 mm/hr (ref 0–40)

## 2020-11-30 LAB — ALPHA-GAL PANEL
Allergen Lamb IgE: 6.33 kU/L — AB
Beef IgE: 6.52 kU/L — AB
IgE (Immunoglobulin E), Serum: 1447 IU/mL — ABNORMAL HIGH (ref 6–495)
O215-IgE Alpha-Gal: 5.62 kU/L — AB
Pork IgE: 7.85 kU/L — AB

## 2020-11-30 LAB — C-REACTIVE PROTEIN: CRP: <1 mg/L (ref 0–10)

## 2020-11-30 LAB — C1 ESTERASE INHIBITOR, FUNCTIONAL: C1INH Functional/C1INH Total MFr SerPl: 97 %mean normal

## 2020-11-30 LAB — CHRONIC URTICARIA: cu index: 3.4 (ref <10)

## 2020-11-30 LAB — C1 ESTERASE INHIBITOR: C1INH SerPl-mCnc: 28 mg/dL (ref 21–39)

## 2020-12-06 ENCOUNTER — Ambulatory Visit: Payer: Medicare HMO | Admitting: Dermatology

## 2020-12-06 DIAGNOSIS — L309 Dermatitis, unspecified: Secondary | ICD-10-CM | POA: Diagnosis not present

## 2020-12-18 ENCOUNTER — Encounter: Payer: Self-pay | Admitting: Allergy

## 2020-12-18 ENCOUNTER — Other Ambulatory Visit: Payer: Self-pay

## 2020-12-18 ENCOUNTER — Ambulatory Visit: Payer: Medicare HMO | Admitting: Allergy

## 2020-12-18 VITALS — BP 140/82 | HR 83 | Temp 98.3°F | Resp 14 | Ht 61.0 in | Wt 124.6 lb

## 2020-12-18 DIAGNOSIS — J3089 Other allergic rhinitis: Secondary | ICD-10-CM | POA: Diagnosis not present

## 2020-12-18 DIAGNOSIS — R21 Rash and other nonspecific skin eruption: Secondary | ICD-10-CM | POA: Diagnosis not present

## 2020-12-18 DIAGNOSIS — J302 Other seasonal allergic rhinitis: Secondary | ICD-10-CM | POA: Diagnosis not present

## 2020-12-18 DIAGNOSIS — Z91018 Allergy to other foods: Secondary | ICD-10-CM | POA: Diagnosis not present

## 2020-12-18 DIAGNOSIS — T781XXD Other adverse food reactions, not elsewhere classified, subsequent encounter: Secondary | ICD-10-CM | POA: Insufficient documentation

## 2020-12-18 MED ORDER — EPINEPHRINE 0.3 MG/0.3ML IJ SOAJ: 0.3000 mg | INTRAMUSCULAR | 2 refills | Status: DC | PRN

## 2020-12-18 NOTE — Assessment & Plan Note (Signed)
Past history - 2022 bloodwork positive to cat, dog, ragweed borderline to dust mites, trees, weed pollen. 2 dogs, 1 cat at home.  See below for environmental control measures.  Take zyrtec 10mg  - 1/2 tablet daily at night.  If it makes you too tired let us know.

## 2020-12-18 NOTE — Patient Instructions (Addendum)
Alpha-gal allergy:  Start to avoid all red meat - beef, pork, lamb.  Okay to eat chicken, [place], seafood.  Avoid cow's milk - no butter, yogurt, cheese.  You may try soy milk, coconut milk, almond milk.  I have prescribed epinephrine injectable and demonstrated proper use. For mild symptoms you can take over the counter antihistamines such as Benadryl and monitor symptoms closely. If symptoms worsen or if you have severe symptoms including breathing issues, throat closure, significant swelling, whole body hives, severe diarrhea and vomiting, lightheadedness then inject epinephrine and seek immediate medical care afterwards.  Food action plan given.   Environmental allergies:  2022 bloodwork positive to cat, dog, ragweed borderline to dust mites, trees, weed pollen.  See below for environmental control measures.  Take zyrtec 10mg  - 1/2 tablet daily at night.  If it makes you too tired let us know.   Skin:   See below for proper skin care.  Make sure to moisturizer daily.  STOP Famotidine   Follow up in 1 months or sooner if needed.   Skin care recommendations  Bath time: . Always use lukewarm water. AVOID very hot or cold water. Marland Kitchen Keep bathing time to 5-10 minutes. . Do NOT use bubble bath. . Use a mild soap and use just enough to wash the dirty areas. . Do NOT scrub skin vigorously.  . After bathing, pat dry your skin with a towel. Do NOT rub or scrub the skin.  Moisturizers and prescriptions:  . ALWAYS apply moisturizers immediately after bathing (within 3 minutes). This helps to lock-in moisture. . Use the moisturizer several times a day over the whole body. Kermit Balo summer moisturizers include: Aveeno, CeraVe, Cetaphil. Kermit Balo winter moisturizers include: Aquaphor, Vaseline, Cerave, Cetaphil, Eucerin, Vanicream. . When using moisturizers along with medications, the moisturizer should be applied about one hour after applying the medication to prevent diluting effect  of the medication or moisturize around where you applied the medications. When not using medications, the moisturizer can be continued twice daily as maintenance.  Laundry and clothing: . Avoid laundry products with added color or perfumes. . Use unscented hypo-allergenic laundry products such as Tide free, Cheer free & gentle, and All free and clear.  . If the skin still seems dry or sensitive, you can try double-rinsing the clothes. . Avoid tight or scratchy clothing such as wool. . Do not use fabric softeners or dyer sheets.  Pet Allergen Avoidance: . Contrary to popular opinion, there are no "hypoallergenic" breeds of dogs or cats. That is because people are not allergic to an animal's hair, but to an allergen found in the animal's saliva, dander (dead skin flakes) or urine. Pet allergy symptoms typically occur within minutes. For some people, symptoms can build up and become most severe 8 to 12 hours after contact with the animal. People with severe allergies can experience reactions in public places if dander has been transported on the pet owners' clothing. Marland Kitchen Keeping an animal outdoors is only a partial solution, since homes with pets in the yard still have higher concentrations of animal allergens. . Before getting a pet, ask your allergist to determine if you are allergic to animals. If your pet is already considered part of your family, try to minimize contact and keep the pet out of the bedroom and other rooms where you spend a great deal of time. . As with dust mites, vacuum carpets often or replace carpet with a hardwood floor, tile or linoleum. . High-efficiency  particulate air (HEPA) cleaners can reduce allergen levels over time. . While dander and saliva are the source of cat and dog allergens, urine is the source of allergens from rabbits, hamsters, mice and [place] pigs; so ask a non-allergic family member to clean the animal's cage. . If you have a pet allergy, talk to your  allergist about the potential for allergy immunotherapy (allergy shots). This strategy can often provide long-term relief. Reducing Pollen Exposure . Pollen seasons: trees (spring), grass (summer) and ragweed/weeds (fall). Marland Kitchen Keep windows closed in your home and car to lower pollen exposure.  Susa Simmonds air conditioning in the bedroom and throughout the house if possible.  . Avoid going out in dry windy days - especially early morning. . Pollen counts are highest between 5 - 10 AM and on dry, hot and windy days.  . Save outside activities for late afternoon or after a heavy rain, when pollen levels are lower.  . Avoid mowing of grass if you have grass pollen allergy. Marland Kitchen Be aware that pollen can also be transported indoors on people and pets.  . Dry your clothes in an automatic dryer rather than hanging them outside where they might collect pollen.  . Rinse hair and eyes before bedtime. Control of House Dust Mite Allergen . Dust mite allergens are a common trigger of allergy and asthma symptoms. While they can be found throughout the house, these microscopic creatures thrive in warm, humid environments such as bedding, upholstered furniture and carpeting. . Because so much time is spent in the bedroom, it is essential to reduce mite levels there.  . Encase pillows, mattresses, and box springs in special allergen-proof fabric covers or airtight, zippered plastic covers.  . Bedding should be washed weekly in hot water (130 F) and dried in a hot dryer. Allergen-proof covers are available for comforters and pillows that can't be regularly washed.  Wendee Copp the allergy-proof covers every few months. Minimize clutter in the bedroom. Keep pets out of the bedroom.  Marland Kitchen Keep humidity less than 50% by using a dehumidifier or air conditioning. You can buy a humidity measuring device called a hygrometer to monitor this.  . If possible, replace carpets with hardwood, linoleum, or washable area rugs. If that's not  possible, vacuum frequently with a vacuum that has a HEPA filter. . Remove all upholstered furniture and non-washable window drapes from the bedroom. . Remove all non-washable stuffed toys from the bedroom.  Wash stuffed toys weekly.

## 2020-12-18 NOTE — Assessment & Plan Note (Addendum)
Past history - 2022 bloodwork positive to alpha gal and milk.  Interim history - patient has been eating red meat at least once per week. Denies any other associated symptoms but the rash/itching is persistent - now back on prednisone. Denies recent tick bites.   Start to avoid all red meat - beef, pork, lamb.  Okay to eat chicken, [place], seafood.  Avoid cow's milk - no butter, yogurt, cheese.  You may try soy milk, coconut milk, almond milk.  Hopefully the itching/rash is due to exposure to the above allergens.  Stop famotidine.   I have prescribed epinephrine injectable and demonstrated proper use. For mild symptoms you can take over the counter antihistamines such as Benadryl and monitor symptoms closely. If symptoms worsen or if you have severe symptoms including breathing issues, throat closure, significant swelling, whole body hives, severe diarrhea and vomiting, lightheadedness then inject epinephrine and seek immediate medical care afterwards.  Food action plan given.

## 2020-12-18 NOTE — Progress Notes (Signed)
Follow Up Note  RE: Alexa [name] MRN: [id number] DOB: [birth year] Date of Office Visit: 12/18/2020  Referring provider: Sueanne Margarita, DO Primary care provider: Sueanne Margarita, DO  Chief Complaint: Rash (Uses the triamcinolone 2-3 hours for rashes. )  History of Present Illness: I had the pleasure of seeing Alexa [name] for a follow up visit at the [facility] on 12/18/2020. She is a 87 y.o. female, who is being followed for rash. Her previous allergy office visit was on 11/20/2020 with Dr. Maudie Mercury. Today is a regular follow up visit.  She is accompanied today by her friend who provided/contributed to the history.   Patient does consume red meat/pork chops about once per week or more.  Patient also consumes milk on a daily basis - at least 2 x 8 ounces.  Still has the rash daily and now back on prednisone. Patient does moisturize daily and using hydrocortisone cream daily.  Taking zyrtec 1 tablet and it caused too much drowsiness.   Has 2 dogs and 1 cat at home.  Assessment and Plan: Alexa [name] is a 87 y.o. female with: Allergy to alpha-gal Past history - 2022 bloodwork positive to alpha gal and milk.  Interim history - patient has been eating red meat at least once per week. Denies any other associated symptoms but the rash/itching is persistent - now back on prednisone. Denies recent tick bites.   Start to avoid all red meat - beef, pork, lamb.  Okay to eat chicken, [place], seafood.  Avoid cow's milk - no butter, yogurt, cheese.  You may try soy milk, coconut milk, almond milk.  Hopefully the itching/rash is due to exposure to the above allergens.  Stop famotidine.   I have prescribed epinephrine injectable and demonstrated proper use. For mild symptoms you can take over the counter antihistamines such as Benadryl and monitor symptoms closely. If symptoms worsen or if you have severe symptoms including breathing issues, throat closure, significant swelling,  whole body hives, severe diarrhea and vomiting, lightheadedness then inject epinephrine and seek immediate medical care afterwards.  Food action plan given.   Other adverse food reactions, not elsewhere classified, subsequent encounter Past history - 2022 bloodwork positive to milk. Interim history - consumes on a daily basis.  See assessment and plan as above.  Seasonal and perennial allergic rhinitis Past history - 2022 bloodwork positive to cat, dog, ragweed borderline to dust mites, trees, weed pollen. 2 dogs, 1 cat at home.  See below for environmental control measures.  Take zyrtec 29m - 1/2 tablet daily at night.  If it makes you too tired let [place].   Return in about 4 weeks (around 01/15/2021).  Meds ordered this encounter  Medications  . EPINEPHrine 0.3 mg/0.3 mL IJ SOAJ injection    Sig: Inject 0.3 mg into the muscle as needed for anaphylaxis.    Dispense:  1 each    Refill:  2   Lab Orders  No laboratory test(s) ordered today    Diagnostics: None.   Medication List:  Current Outpatient Medications  Medication Sig Dispense Refill  . acetaminophen-codeine (TYLENOL #3) 300-30 MG tablet TAKE 1 TO 2 TABLETS BY MOUTH EVERY 8 HOURS AS NEEDED FOR MODERATE PAIN 30 tablet 2  . amLODipine (NORVASC) 5 MG tablet Take 1 tablet (5 mg total) by mouth daily. 90 tablet 3  . Ascorbic Acid (VITAMIN C) POWD Take 1 packet by mouth 2 (two) times daily as needed (cold symptoms).    .Marland Kitchen  aspirin EC 81 MG tablet Take 162 mg by mouth daily.    . Benzalkonium Chloride (DIABETIC BASICS HEALTHY FOOT EX) Apply 1 application topically daily as needed (foot pain). OTC diabetic foot cream    . Blood Glucose Monitoring Suppl (TRUE METRIX METER) w/Device KIT Use as directed to monitor glucose up to 2 times daily. E11.9 dx code 1 kit 0  . busPIRone (BUSPAR) 7.5 MG tablet Take 2 tablets (15 mg total) by mouth 2 (two) times daily. May take one 4x a day if preferred 360 tablet 3  . CALCIUM PO Take 1  tablet by mouth daily.    . Cholecalciferol (VITAMIN D3) 1.25 MG (50000 UT) CAPS Take 1 weekly for 12 weeks 12 capsule 0  . CRANBERRY PO Take 2 capsules by mouth 2 (two) times daily. 62m    . EPINEPHrine 0.3 mg/0.3 mL IJ SOAJ injection Inject 0.3 mg into the muscle as needed for anaphylaxis. 1 each 2  . ESTRACE VAGINAL 0.1 MG/GM vaginal cream Apply locally every other night at bedtime  1  . fluticasone (FLONASE) 50 MCG/ACT nasal spray INSTILL 2 SPRAYS INTO EACH NOSTRIL ONCE DAILY 48 g 3  . gabapentin (NEURONTIN) 100 MG capsule Take 1 capsule (100 mg total) by mouth 3 (three) times daily. Take one capsule by mouth three times daily. 270 capsule 3  . glucose blood (TRUETEST TEST) test strip Use to test blood sugar ICD 10 E11 9 100 each 3  . Multiple Vitamins-Minerals (EYE VITAMINS PO) Take by mouth. AREDS2    . predniSONE (DELTASONE) 10 MG tablet Take 4 tablets by mouth for 2 days, Take 3 tablets by mouth for 2 days. Take 2 tablets by mouth for 2 days. Take one tabled by mouth for 1 day. 20 tablet 0  . sitaGLIPtin (JANUVIA) 50 MG tablet Take 1 tablet (50 mg total) by mouth daily. 30 tablet 3  . triamcinolone (KENALOG) 0.025 % cream APPLY 1 APPLICATION TOPICALLY TWICE DAILY AS NEEDED FOR DRY SKIN ON BACK 80 g 1  . TRUEplus Lancets 33G MISC Use as directed to check glucose up to 2 times daily. E11.9 dx code 100 each 3  . cetirizine (ZYRTEC ALLERGY) 10 MG tablet Take 1/2 tablet to 1 tablet daily. 30 tablet 2   No current facility-administered medications for this visit.   Allergies: Allergies  Allergen Reactions  . Lisinopril     Cough D/Ced by Dr WMelvyn Novas . Codeine     nausea  . Verapamil     Pain in feet   I reviewed her past medical history, social history, family history, and environmental history and no significant changes have been reported from her previous visit.  Review of Systems  Constitutional: Negative for appetite change, chills, fever and unexpected weight change.  HENT:  Negative for congestion and rhinorrhea.   Eyes: Negative for itching.  Respiratory: Negative for cough, chest tightness, shortness of breath and wheezing.   Cardiovascular: Negative for chest pain.  Gastrointestinal: Negative for abdominal pain.  Genitourinary: Negative for difficulty urinating.  Skin: Positive for rash.  Allergic/Immunologic: Positive for environmental allergies and food allergies.  Neurological: Negative for headaches.   Objective: BP 140/82   Pulse 83   Temp 98.3 F (36.8 C)   Resp 14   Ht 5' 1" (1.549 m)   Wt 124 lb 9.6 oz (56.5 kg)   SpO2 97%   BMI 23.54 kg/m  Body mass index is 23.54 kg/m. Physical Exam Vitals and nursing note reviewed.  Constitutional:      Appearance: Normal appearance. She is well-developed.  HENT:     Head: Normocephalic and atraumatic.     Right Ear: Tympanic membrane and external ear normal.     Left Ear: Tympanic membrane and external ear normal.     Nose: Nose normal.     Mouth/Throat:     Mouth: Mucous membranes are moist.     Pharynx: Oropharynx is clear.  Eyes:     Conjunctiva/sclera: Conjunctivae normal.  Cardiovascular:     Rate and Rhythm: Normal rate and regular rhythm.     Heart sounds: Normal heart sounds. No murmur heard. No friction rub. No gallop.   Pulmonary:     Effort: Pulmonary effort is normal.     Breath sounds: Normal breath sounds. No wheezing, rhonchi or rales.  Musculoskeletal:     Cervical back: Neck supple.  Skin:    General: Skin is warm and dry.     Findings: Rash present.     Comments: Erythematous hue on the upper posterior torso with flaky dry skin.  Neurological:     Mental Status: She is alert and oriented to person, place, and time.  Psychiatric:        Behavior: Behavior normal.    Previous notes and tests were reviewed. The plan was reviewed with the patient/family, and all questions/concerned were addressed.  It was my pleasure to see Alexa [name] today and participate in her care.  Please feel free to contact me with any questions or concerns.  Sincerely,  Rexene Alberts, DO Allergy & Immunology  [facility] office: [place] office: [phone]

## 2020-12-18 NOTE — Assessment & Plan Note (Signed)
Past history - 2022 bloodwork positive to milk. Interim history - consumes on a daily basis.  See assessment and plan as above.

## 2020-12-20 DIAGNOSIS — Z0100 Encounter for examination of eyes and vision without abnormal findings: Secondary | ICD-10-CM | POA: Diagnosis not present

## 2021-01-02 DIAGNOSIS — I10 Essential (primary) hypertension: Secondary | ICD-10-CM | POA: Diagnosis not present

## 2021-01-02 DIAGNOSIS — M549 Dorsalgia, unspecified: Secondary | ICD-10-CM | POA: Diagnosis not present

## 2021-01-02 DIAGNOSIS — L309 Dermatitis, unspecified: Secondary | ICD-10-CM | POA: Diagnosis not present

## 2021-01-02 DIAGNOSIS — Z85528 Personal history of other malignant neoplasm of kidney: Secondary | ICD-10-CM | POA: Diagnosis not present

## 2021-01-02 DIAGNOSIS — F0390 Unspecified dementia without behavioral disturbance: Secondary | ICD-10-CM | POA: Diagnosis not present

## 2021-01-02 DIAGNOSIS — E1169 Type 2 diabetes mellitus with other specified complication: Secondary | ICD-10-CM | POA: Diagnosis not present

## 2021-01-12 DIAGNOSIS — H353132 Nonexudative age-related macular degeneration, bilateral, intermediate dry stage: Secondary | ICD-10-CM | POA: Diagnosis not present

## 2021-01-12 DIAGNOSIS — H04123 Dry eye syndrome of bilateral lacrimal glands: Secondary | ICD-10-CM | POA: Diagnosis not present

## 2021-01-12 DIAGNOSIS — H47213 Primary optic atrophy, bilateral: Secondary | ICD-10-CM | POA: Diagnosis not present

## 2021-01-12 DIAGNOSIS — H0102A Squamous blepharitis right eye, upper and lower eyelids: Secondary | ICD-10-CM | POA: Diagnosis not present

## 2021-01-12 DIAGNOSIS — Z961 Presence of intraocular lens: Secondary | ICD-10-CM | POA: Diagnosis not present

## 2021-01-12 DIAGNOSIS — H0102B Squamous blepharitis left eye, upper and lower eyelids: Secondary | ICD-10-CM | POA: Diagnosis not present

## 2021-01-12 DIAGNOSIS — E119 Type 2 diabetes mellitus without complications: Secondary | ICD-10-CM | POA: Diagnosis not present

## 2021-01-19 ENCOUNTER — Ambulatory Visit: Payer: Medicare HMO | Admitting: Allergy

## 2021-01-19 ENCOUNTER — Encounter: Payer: Self-pay | Admitting: Allergy

## 2021-01-19 ENCOUNTER — Other Ambulatory Visit: Payer: Self-pay

## 2021-01-19 VITALS — BP 146/70 | HR 57 | Temp 98.4°F | Resp 16

## 2021-01-19 DIAGNOSIS — Z91018 Allergy to other foods: Secondary | ICD-10-CM | POA: Diagnosis not present

## 2021-01-19 DIAGNOSIS — R21 Rash and other nonspecific skin eruption: Secondary | ICD-10-CM

## 2021-01-19 DIAGNOSIS — J3089 Other allergic rhinitis: Secondary | ICD-10-CM

## 2021-01-19 DIAGNOSIS — T781XXD Other adverse food reactions, not elsewhere classified, subsequent encounter: Secondary | ICD-10-CM

## 2021-01-19 DIAGNOSIS — J302 Other seasonal allergic rhinitis: Secondary | ICD-10-CM | POA: Diagnosis not present

## 2021-01-19 MED ORDER — EPINEPHRINE 0.3 MG/0.3ML IJ SOAJ: 0.3000 mg | INTRAMUSCULAR | 1 refills | Status: DC | PRN

## 2021-01-19 MED ORDER — CETIRIZINE HCL 10 MG PO TABS: ORAL_TABLET | ORAL | 5 refills | Status: DC

## 2021-01-19 NOTE — Patient Instructions (Addendum)
Alpha-gal allergy and food allergy - handout given.  Continue avoid all red meat - beef, pork, lamb. Okay to eat chicken, [place], seafood. Avoid cow's milk - no butter, yogurt, cheese, ice cream. You may try soy milk, coconut milk, almond milk. For mild symptoms you can take over the counter antihistamines such as Benadryl and monitor symptoms closely. If symptoms worsen or if you have severe symptoms including breathing issues, throat closure, significant swelling, whole body hives, severe diarrhea and vomiting, lightheadedness then inject epinephrine and seek immediate medical care afterwards. Food action plan in place.   Environmental allergies: 2022 bloodwork positive to cat, dog, ragweed borderline to dust mites, trees, weed pollen. See below for environmental control measures. Take zyrtec 10mg .  Use Flonase (fluticasone) nasal spray 1 spray per nostril twice a day as needed for nasal congestion.   Skin:  Keep track of rash outbreaks. See below for proper skin care. Make sure to moisturizer daily.  Follow up in 3 months or sooner if needed.  01/19/2021  Skin care recommendations  Bath time: Always use lukewarm water. AVOID very hot or cold water. Keep bathing time to 5-10 minutes. Do NOT use bubble bath. Use a mild soap and use just enough to wash the dirty areas. Do NOT scrub skin vigorously.  After bathing, pat dry your skin with a towel. Do NOT rub or scrub the skin.  Moisturizers and prescriptions:  ALWAYS apply moisturizers immediately after bathing (within 3 minutes). This helps to lock-in moisture. Use the moisturizer several times a day over the whole body. Good summer moisturizers include: Aveeno, CeraVe, Cetaphil. Good winter moisturizers include: Aquaphor, Vaseline, Cerave, Cetaphil, Eucerin, Vanicream. When using moisturizers along with medications, the moisturizer should be applied about one hour after applying the medication to prevent diluting effect of the  medication or moisturize around where you applied the medications. When not using medications, the moisturizer can be continued twice daily as maintenance.  Laundry and clothing: Avoid laundry products with added color or perfumes. Use unscented hypo-allergenic laundry products such as Tide free, Cheer free & gentle, and All free and clear.  If the skin still seems dry or sensitive, you can try double-rinsing the clothes. Avoid tight or scratchy clothing such as wool. Do not use fabric softeners or dyer sheets.  Pet Allergen Avoidance: Contrary to popular opinion, there are no "hypoallergenic" breeds of dogs or cats. That is because people are not allergic to an animal's hair, but to an allergen found in the animal's saliva, dander (dead skin flakes) or urine. Pet allergy symptoms typically occur within minutes. For some people, symptoms can build up and become most severe 8 to 12 hours after contact with the animal. People with severe allergies can experience reactions in public places if dander has been transported on the pet owners' clothing. Keeping an animal outdoors is only a partial solution, since homes with pets in the yard still have higher concentrations of animal allergens. Before getting a pet, ask your allergist to determine if you are allergic to animals. If your pet is already considered part of your family, try to minimize contact and keep the pet out of the bedroom and other rooms where you spend a great deal of time. As with dust mites, vacuum carpets often or replace carpet with a hardwood floor, tile or linoleum. High-efficiency particulate air (HEPA) cleaners can reduce allergen levels over time. While dander and saliva are the source of cat and dog allergens, urine is the source of allergens  from rabbits, hamsters, mice and [place] pigs; so ask a non-allergic family member to clean the animal's cage. If you have a pet allergy, talk to your allergist about the potential for  allergy immunotherapy (allergy shots). This strategy can often provide long-term relief. Reducing Pollen Exposure Pollen seasons: trees (spring), grass (summer) and ragweed/weeds (fall). Keep windows closed in your home and car to lower pollen exposure.  Install air conditioning in the bedroom and throughout the house if possible.  Avoid going out in dry windy days - especially early morning. Pollen counts are highest between 5 - 10 AM and on dry, hot and windy days.  Save outside activities for late afternoon or after a heavy rain, when pollen levels are lower.  Avoid mowing of grass if you have grass pollen allergy. Be aware that pollen can also be transported indoors on people and pets.  Dry your clothes in an automatic dryer rather than hanging them outside where they might collect pollen.  Rinse hair and eyes before bedtime. Control of House Dust Mite Allergen Dust mite allergens are a common trigger of allergy and asthma symptoms. While they can be found throughout the house, these microscopic creatures thrive in warm, humid environments such as bedding, upholstered furniture and carpeting. Because so much time is spent in the bedroom, it is essential to reduce mite levels there.  Encase pillows, mattresses, and box springs in special allergen-proof fabric covers or airtight, zippered plastic covers.  Bedding should be washed weekly in hot water (130 F) and dried in a hot dryer. Allergen-proof covers are available for comforters and pillows that can't be regularly washed.  Wash the allergy-proof covers every few months. Minimize clutter in the bedroom. Keep pets out of the bedroom.  Keep humidity less than 50% by using a dehumidifier or air conditioning. You can buy a humidity measuring device called a hygrometer to monitor this.  If possible, replace carpets with hardwood, linoleum, or washable area rugs. If that's not possible, vacuum frequently with a vacuum that has a HEPA  filter. Remove all upholstered furniture and non-washable window drapes from the bedroom. Remove all non-washable stuffed toys from the bedroom.  Wash stuffed toys weekly.

## 2021-01-19 NOTE — Progress Notes (Signed)
RE: Alexa [name] MRN: [id number] DOB: [birth year] Date of Telemedicine Visit: 01/19/2021  Referring provider: Sueanne Margarita, DO Primary care provider: Sueanne Margarita, DO  Chief Complaint: Follow-up   Telemedicine Follow Up Visit via Telephone: I connected with Alexa [name] for a follow up on 01/22/21 by telephone and verified that I am speaking with the correct person using two identifiers.   I discussed the limitations, risks, security and privacy concerns of performing an evaluation and management service by telephone and the availability of in person appointments. I also discussed with the patient that there may be a patient responsible charge related to this service. The patient expressed understanding and agreed to proceed.  Patient is at the office. Provider is at home.  Visit start time: 11:29AM Visit end time: 11:50AM Insurance consent/check in by: front desk Medical consent and medical assistant/nurse: Ashleigh F.  History of Present Illness: She is a 87 y.o. female, who is being followed for alpha gal allergy, food allergy, allergic rhinitis. Her previous allergy office visit was on 12/18/2020 with Dr. Maudie Mercury. Today is a regular follow up visit.  Allergy to alpha-gal/milk Currently avoiding beef, pork, lamb. But sometimes forgets and she had lamb chops earlier this month with no reactions that she is aware of.  Had 1 breakout since the last visit and not sure what was the triggering foods. She thinks she may be accidentally eating foods that contain milk.   Patient is not sure when was her last prednisone dose but she thinks she had another dose since the last visit.   She also had ice cream the other day and she may have broken out from that as well.   Seasonal and perennial allergic rhinitis Taking zyrtec 33m daily with good benefit.  Using Flonase 1 spray per nostril 1-2 times per day with good benefit. No nosebleeds.   Assessment and Plan: Alexa [name] a 87y.o. female  with: Allergy to alpha-gal Past history - 2022 bloodwork positive to alpha gal and milk.  Interim history - patient sometimes forgets to avoid these foods and she is not sure if that's when she is breaking out or not. Handout given on alpha gal and milk allergy.  Continue avoid all red meat - beef, pork, lamb. Okay to eat chicken, [place] seafood. Avoid cow's milk - no butter, yogurt, cheese, ice cream. You may try soy milk, coconut milk, almond milk. For mild symptoms you can take over the counter antihistamines such as Benadryl and monitor symptoms closely. If symptoms worsen or if you have severe symptoms including breathing issues, throat closure, significant swelling, whole body hives, severe diarrhea and vomiting, lightheadedness then inject epinephrine and seek immediate medical care afterwards. Food action plan in place.   Other adverse food reactions, not elsewhere classified, subsequent encounter Past history - 2022 bloodwork positive to milk. Interim history - still accidentally ingesting dairy.  See assessment and plan as above.  Seasonal and perennial allergic rhinitis Past history - 2022 bloodwork positive to cat, dog, ragweed borderline to dust mites, trees, weed pollen. 2 dogs, 1 cat at home. Interim history - stable.  See below for environmental control measures. Take zyrtec 133monce a day.  Use Flonase (fluticasone) nasal spray 1 spray per nostril twice a day as needed for nasal congestion.   Rash and other nonspecific skin eruption Still breaking out at times and discussed with patient it may be due to her accidental ingestions of the above allergens. Keep track of rash outbreaks. See  below for proper skin care. Make sure to moisturizer daily.  Return in about 3 months (around 04/21/2021).  Meds ordered this encounter  Medications   cetirizine (ZYRTEC ALLERGY) 10 MG tablet    Sig: Take 1/2 tablet to 1 tablet daily.    Dispense:  30 tablet    Refill:  5    EPINEPHrine 0.3 mg/0.3 mL IJ SOAJ injection    Sig: Inject 0.3 mg into the muscle as needed for anaphylaxis.    Dispense:  1 each    Refill:  1    Please dispense Mylan generic brand.    Lab Orders  No laboratory test(s) ordered today    Diagnostics: None.  Medication List:  Current Outpatient Medications  Medication Sig Dispense Refill   Ascorbic Acid (VITAMIN C) POWD Take 1 packet by mouth 2 (two) times daily as needed (cold symptoms).     aspirin EC 81 MG tablet Take 162 mg by mouth daily.     Benzalkonium Chloride (DIABETIC BASICS HEALTHY FOOT EX) Apply 1 application topically daily as needed (foot pain). OTC diabetic foot cream     Blood Glucose Monitoring Suppl (TRUE METRIX METER) w/Device KIT Use as directed to monitor glucose up to 2 times daily. E11.9 dx code 1 kit 0   busPIRone (BUSPAR) 7.5 MG tablet Take 2 tablets (15 mg total) by mouth 2 (two) times daily. May take one 4x a day if preferred 360 tablet 3   CALCIUM PO Take 1 tablet by mouth daily.     Cholecalciferol (VITAMIN D3) 1.25 MG (50000 UT) CAPS Take 1 weekly for 12 weeks 12 capsule 0   CRANBERRY PO Take 2 capsules by mouth 2 (two) times daily. 57m     ESTRACE VAGINAL 0.1 MG/GM vaginal cream Apply locally every other night at bedtime  1   fluticasone (FLONASE) 50 MCG/ACT nasal spray INSTILL 2 SPRAYS INTO EACH NOSTRIL ONCE DAILY 48 g 3   gabapentin (NEURONTIN) 100 MG capsule Take 1 capsule (100 mg total) by mouth 3 (three) times daily. Take one capsule by mouth three times daily. 270 capsule 3   glucose blood (TRUETEST TEST) test strip Use to test blood sugar ICD 10 E11 9 100 each 3   Multiple Vitamins-Minerals (EYE VITAMINS PO) Take by mouth. AREDS2     sitaGLIPtin (JANUVIA) 50 MG tablet Take 1 tablet (50 mg total) by mouth daily. 30 tablet 3   triamcinolone (KENALOG) 0.025 % cream APPLY 1 APPLICATION TOPICALLY TWICE DAILY AS NEEDED FOR DRY SKIN ON BACK 80 g 1   TRUEplus Lancets 33G MISC Use as directed to check  glucose up to 2 times daily. E11.9 dx code 100 each 3   amLODipine (NORVASC) 5 MG tablet Take 1 tablet (5 mg total) by mouth daily. 90 tablet 3   cetirizine (ZYRTEC ALLERGY) 10 MG tablet Take 1/2 tablet to 1 tablet daily. 30 tablet 5   EPINEPHrine 0.3 mg/0.3 mL IJ SOAJ injection Inject 0.3 mg into the muscle as needed for anaphylaxis. 1 each 1   No current facility-administered medications for this visit.   Allergies: Allergies  Allergen Reactions   Lisinopril     Cough D/Ced by Dr WMelvyn Novas  Codeine     nausea   Verapamil     Pain in feet   I reviewed her past medical history, social history, family history, and environmental history and no significant changes have been reported from her previous visit.  Review of Systems  Constitutional:  Negative  for appetite change, chills, fever and unexpected weight change.  HENT:  Negative for congestion and rhinorrhea.   Eyes:  Negative for itching.  Respiratory:  Negative for cough, chest tightness, shortness of breath and wheezing.   Cardiovascular:  Negative for chest pain.  Gastrointestinal:  Negative for abdominal pain.  Genitourinary:  Negative for difficulty urinating.  Skin:  Positive for rash.  Allergic/Immunologic: Positive for environmental allergies and food allergies.  Neurological:  Negative for headaches.   Objective: Physical Exam Not obtained as encounter was done via telephone.   Previous notes and tests were reviewed.  I discussed the assessment and treatment plan with the patient. The patient was provided an opportunity to ask questions and all were answered. The patient agreed with the plan and demonstrated an understanding of the instructions. After visit summary/patient instructions available via mychart.   The patient was advised to call back or seek an in-person evaluation if the symptoms worsen or if the condition fails to improve as anticipated.  I provided 31 minutes of non-face-to-face time during this  encounter.  It was my pleasure to participate in [place] care today. Please feel free to contact me with any questions or concerns.   Sincerely,  Rexene Alberts, DO Allergy & Immunology  [facility] office: [place] office: [phone]

## 2021-01-22 NOTE — Assessment & Plan Note (Signed)
Past history - 2022 bloodwork positive to cat, dog, ragweed borderline to dust mites, trees, weed pollen. 2 dogs, 1 cat at home. Interim history - stable.   See below for environmental control measures.  Take zyrtec 10mg  once a day.   Use Flonase (fluticasone) nasal spray 1 spray per nostril twice a day as needed for nasal congestion.

## 2021-01-22 NOTE — Assessment & Plan Note (Signed)
Past history - 2022 bloodwork positive to alpha gal and milk.  Interim history - patient sometimes forgets to avoid these foods and she is not sure if that's when she is breaking out or not.  Handout given on alpha gal and milk allergy.   Continue avoid all red meat - beef, pork, lamb.  Okay to eat chicken, [place], seafood.  Avoid cow's milk - no butter, yogurt, cheese, ice cream.  You may try soy milk, coconut milk, almond milk.  For mild symptoms you can take over the counter antihistamines such as Benadryl and monitor symptoms closely. If symptoms worsen or if you have severe symptoms including breathing issues, throat closure, significant swelling, whole body hives, severe diarrhea and vomiting, lightheadedness then inject epinephrine and seek immediate medical care afterwards.  Food action plan in place.

## 2021-01-22 NOTE — Assessment & Plan Note (Signed)
Still breaking out at times and discussed with patient it may be due to her accidental ingestions of the above allergens.  Keep track of rash outbreaks.  See below for proper skin care.  Make sure to moisturizer daily.

## 2021-01-22 NOTE — Assessment & Plan Note (Signed)
Past history - 2022 bloodwork positive to milk. Interim history - still accidentally ingesting dairy.   See assessment and plan as above.

## 2021-01-25 DIAGNOSIS — I10 Essential (primary) hypertension: Secondary | ICD-10-CM | POA: Diagnosis not present

## 2021-01-25 DIAGNOSIS — E119 Type 2 diabetes mellitus without complications: Secondary | ICD-10-CM | POA: Diagnosis not present

## 2021-01-25 DIAGNOSIS — K559 Vascular disorder of intestine, unspecified: Secondary | ICD-10-CM | POA: Diagnosis not present

## 2021-01-25 DIAGNOSIS — J449 Chronic obstructive pulmonary disease, unspecified: Secondary | ICD-10-CM | POA: Diagnosis not present

## 2021-01-25 DIAGNOSIS — E559 Vitamin D deficiency, unspecified: Secondary | ICD-10-CM | POA: Diagnosis not present

## 2021-01-25 DIAGNOSIS — M4802 Spinal stenosis, cervical region: Secondary | ICD-10-CM | POA: Diagnosis not present

## 2021-01-25 DIAGNOSIS — K219 Gastro-esophageal reflux disease without esophagitis: Secondary | ICD-10-CM | POA: Diagnosis not present

## 2021-01-25 DIAGNOSIS — M5416 Radiculopathy, lumbar region: Secondary | ICD-10-CM | POA: Diagnosis not present

## 2021-01-25 DIAGNOSIS — E785 Hyperlipidemia, unspecified: Secondary | ICD-10-CM | POA: Diagnosis not present

## 2021-01-29 DIAGNOSIS — J449 Chronic obstructive pulmonary disease, unspecified: Secondary | ICD-10-CM | POA: Diagnosis not present

## 2021-01-29 DIAGNOSIS — E785 Hyperlipidemia, unspecified: Secondary | ICD-10-CM | POA: Diagnosis not present

## 2021-01-29 DIAGNOSIS — I10 Essential (primary) hypertension: Secondary | ICD-10-CM | POA: Diagnosis not present

## 2021-01-29 DIAGNOSIS — E119 Type 2 diabetes mellitus without complications: Secondary | ICD-10-CM | POA: Diagnosis not present

## 2021-01-29 DIAGNOSIS — K559 Vascular disorder of intestine, unspecified: Secondary | ICD-10-CM | POA: Diagnosis not present

## 2021-01-29 DIAGNOSIS — M4802 Spinal stenosis, cervical region: Secondary | ICD-10-CM | POA: Diagnosis not present

## 2021-01-29 DIAGNOSIS — M5416 Radiculopathy, lumbar region: Secondary | ICD-10-CM | POA: Diagnosis not present

## 2021-01-29 DIAGNOSIS — E559 Vitamin D deficiency, unspecified: Secondary | ICD-10-CM | POA: Diagnosis not present

## 2021-01-29 DIAGNOSIS — K219 Gastro-esophageal reflux disease without esophagitis: Secondary | ICD-10-CM | POA: Diagnosis not present

## 2021-01-30 DIAGNOSIS — K559 Vascular disorder of intestine, unspecified: Secondary | ICD-10-CM | POA: Diagnosis not present

## 2021-01-30 DIAGNOSIS — E119 Type 2 diabetes mellitus without complications: Secondary | ICD-10-CM | POA: Diagnosis not present

## 2021-01-30 DIAGNOSIS — E785 Hyperlipidemia, unspecified: Secondary | ICD-10-CM | POA: Diagnosis not present

## 2021-01-30 DIAGNOSIS — M5416 Radiculopathy, lumbar region: Secondary | ICD-10-CM | POA: Diagnosis not present

## 2021-01-30 DIAGNOSIS — K219 Gastro-esophageal reflux disease without esophagitis: Secondary | ICD-10-CM | POA: Diagnosis not present

## 2021-01-30 DIAGNOSIS — E559 Vitamin D deficiency, unspecified: Secondary | ICD-10-CM | POA: Diagnosis not present

## 2021-01-30 DIAGNOSIS — J449 Chronic obstructive pulmonary disease, unspecified: Secondary | ICD-10-CM | POA: Diagnosis not present

## 2021-01-30 DIAGNOSIS — I10 Essential (primary) hypertension: Secondary | ICD-10-CM | POA: Diagnosis not present

## 2021-01-30 DIAGNOSIS — M4802 Spinal stenosis, cervical region: Secondary | ICD-10-CM | POA: Diagnosis not present

## 2021-02-06 DIAGNOSIS — J449 Chronic obstructive pulmonary disease, unspecified: Secondary | ICD-10-CM | POA: Diagnosis not present

## 2021-02-06 DIAGNOSIS — M5416 Radiculopathy, lumbar region: Secondary | ICD-10-CM | POA: Diagnosis not present

## 2021-02-06 DIAGNOSIS — K559 Vascular disorder of intestine, unspecified: Secondary | ICD-10-CM | POA: Diagnosis not present

## 2021-02-06 DIAGNOSIS — K219 Gastro-esophageal reflux disease without esophagitis: Secondary | ICD-10-CM | POA: Diagnosis not present

## 2021-02-06 DIAGNOSIS — E119 Type 2 diabetes mellitus without complications: Secondary | ICD-10-CM | POA: Diagnosis not present

## 2021-02-06 DIAGNOSIS — E785 Hyperlipidemia, unspecified: Secondary | ICD-10-CM | POA: Diagnosis not present

## 2021-02-06 DIAGNOSIS — E559 Vitamin D deficiency, unspecified: Secondary | ICD-10-CM | POA: Diagnosis not present

## 2021-02-06 DIAGNOSIS — I10 Essential (primary) hypertension: Secondary | ICD-10-CM | POA: Diagnosis not present

## 2021-02-06 DIAGNOSIS — M4802 Spinal stenosis, cervical region: Secondary | ICD-10-CM | POA: Diagnosis not present

## 2021-02-19 DIAGNOSIS — Z91018 Allergy to other foods: Secondary | ICD-10-CM | POA: Diagnosis not present

## 2021-02-19 DIAGNOSIS — L309 Dermatitis, unspecified: Secondary | ICD-10-CM | POA: Diagnosis not present

## 2021-02-19 DIAGNOSIS — E1169 Type 2 diabetes mellitus with other specified complication: Secondary | ICD-10-CM | POA: Diagnosis not present

## 2021-02-19 DIAGNOSIS — I10 Essential (primary) hypertension: Secondary | ICD-10-CM | POA: Diagnosis not present

## 2021-02-23 DIAGNOSIS — M5416 Radiculopathy, lumbar region: Secondary | ICD-10-CM | POA: Diagnosis not present

## 2021-02-23 DIAGNOSIS — J449 Chronic obstructive pulmonary disease, unspecified: Secondary | ICD-10-CM | POA: Diagnosis not present

## 2021-02-23 DIAGNOSIS — I10 Essential (primary) hypertension: Secondary | ICD-10-CM | POA: Diagnosis not present

## 2021-02-23 DIAGNOSIS — K559 Vascular disorder of intestine, unspecified: Secondary | ICD-10-CM | POA: Diagnosis not present

## 2021-02-23 DIAGNOSIS — E119 Type 2 diabetes mellitus without complications: Secondary | ICD-10-CM | POA: Diagnosis not present

## 2021-02-23 DIAGNOSIS — K219 Gastro-esophageal reflux disease without esophagitis: Secondary | ICD-10-CM | POA: Diagnosis not present

## 2021-02-23 DIAGNOSIS — E785 Hyperlipidemia, unspecified: Secondary | ICD-10-CM | POA: Diagnosis not present

## 2021-02-23 DIAGNOSIS — M4802 Spinal stenosis, cervical region: Secondary | ICD-10-CM | POA: Diagnosis not present

## 2021-02-23 DIAGNOSIS — E559 Vitamin D deficiency, unspecified: Secondary | ICD-10-CM | POA: Diagnosis not present

## 2021-04-18 ENCOUNTER — Ambulatory Visit: Payer: Self-pay | Admitting: Allergy

## 2021-04-27 ENCOUNTER — Ambulatory Visit: Payer: Medicare HMO | Admitting: Allergy

## 2021-04-27 NOTE — Progress Notes (Deleted)
Follow Up Note  RE: Alexa [name] MRN: [id number] DOB: February 19, 1930 Date of Office Visit: 04/27/2021  Referring provider: Sueanne Margarita, DO Primary care provider: Sueanne Margarita, DO  Chief Complaint: No chief complaint on file.  History of Present Illness: I had the pleasure of seeing Alexa [name] for a follow up visit at the [facility] on 04/27/2021. She is a 87 y.o. female, who is being followed for alpha gal allergy, allergic rhinitis and rash. Her previous allergy office visit was on 01/19/2021 with Dr. Maudie Mercury via telemedicine. Today is a regular follow up visit.  Allergy to alpha-gal Past history - 2022 bloodwork positive to alpha gal and milk.  Interim history - patient sometimes forgets to avoid these foods and she is not sure if that's when she is breaking out or not. Handout given on alpha gal and milk allergy.  Continue avoid all red meat - beef, pork, lamb. Okay to eat chicken, [place], seafood. Avoid cow's milk - no butter, yogurt, cheese, ice cream. You may try soy milk, coconut milk, almond milk. For mild symptoms you can take over the counter antihistamines such as Benadryl and monitor symptoms closely. If symptoms worsen or if you have severe symptoms including breathing issues, throat closure, significant swelling, whole body hives, severe diarrhea and vomiting, lightheadedness then inject epinephrine and seek immediate medical care afterwards. Food action plan in place.    Other adverse food reactions, not elsewhere classified, subsequent encounter Past history - 2022 bloodwork positive to milk. Interim history - still accidentally ingesting dairy.  See assessment and plan as above.   Seasonal and perennial allergic rhinitis Past history - 2022 bloodwork positive to cat, dog, ragweed borderline to dust mites, trees, weed pollen. 2 dogs, 1 cat at home. Interim history - stable.  See below for environmental control measures. Take zyrtec 38m once a day.   Use Flonase (fluticasone) nasal spray 1 spray per nostril twice a day as needed for nasal congestion.    Rash and other nonspecific skin eruption Still breaking out at times and discussed with patient it may be due to her accidental ingestions of the above allergens. Keep track of rash outbreaks. See below for proper skin care. Make sure to moisturizer daily.  Assessment and Plan: WKoraymais a 87y.o. female with: No problem-specific Assessment & Plan notes found for this encounter.  No follow-ups on file.  No orders of the defined types were placed in this encounter.  Lab Orders  No laboratory test(s) ordered today    Diagnostics: Spirometry:  Tracings reviewed. Her effort: {Blank single:19197::"Good reproducible efforts.","It was hard to get consistent efforts and there is a question as to whether this reflects a maximal maneuver.","Poor effort, data can not be interpreted."} FVC: ***L FEV1: ***L, ***% predicted FEV1/FVC ratio: ***% Interpretation: {Blank single:19197::"Spirometry consistent with mild obstructive disease","Spirometry consistent with moderate obstructive disease","Spirometry consistent with severe obstructive disease","Spirometry consistent with possible restrictive disease","Spirometry consistent with mixed obstructive and restrictive disease","Spirometry uninterpretable due to technique","Spirometry consistent with normal pattern","No overt abnormalities noted given today's efforts"}.  Please see scanned spirometry results for details.  Skin Testing: {Blank single:19197::"Select foods","Environmental allergy panel","Environmental allergy panel and select foods","Food allergy panel","None","Deferred due to recent antihistamines use"}. *** Results discussed with patient/family.   Medication List:  Current Outpatient Medications  Medication Sig Dispense Refill   amLODipine (NORVASC) 5 MG tablet Take 1 tablet (5 mg total) by mouth daily. 90 tablet 3   Ascorbic  Acid (VITAMIN C) POWD  Take 1 packet by mouth 2 (two) times daily as needed (cold symptoms).     aspirin EC 81 MG tablet Take 162 mg by mouth daily.     Benzalkonium Chloride (DIABETIC BASICS HEALTHY FOOT EX) Apply 1 application topically daily as needed (foot pain). OTC diabetic foot cream     Blood Glucose Monitoring Suppl (TRUE METRIX METER) w/Device KIT Use as directed to monitor glucose up to 2 times daily. E11.9 dx code 1 kit 0   busPIRone (BUSPAR) 7.5 MG tablet Take 2 tablets (15 mg total) by mouth 2 (two) times daily. May take one 4x a day if preferred 360 tablet 3   CALCIUM PO Take 1 tablet by mouth daily.     cetirizine (ZYRTEC ALLERGY) 10 MG tablet Take 1/2 tablet to 1 tablet daily. 30 tablet 5   Cholecalciferol (VITAMIN D3) 1.25 MG (50000 UT) CAPS Take 1 weekly for 12 weeks 12 capsule 0   CRANBERRY PO Take 2 capsules by mouth 2 (two) times daily. 77m     EPINEPHrine 0.3 mg/0.3 mL IJ SOAJ injection Inject 0.3 mg into the muscle as needed for anaphylaxis. 1 each 1   ESTRACE VAGINAL 0.1 MG/GM vaginal cream Apply locally every other night at bedtime  1   fluticasone (FLONASE) 50 MCG/ACT nasal spray INSTILL 2 SPRAYS INTO EACH NOSTRIL ONCE DAILY 48 g 3   gabapentin (NEURONTIN) 100 MG capsule Take 1 capsule (100 mg total) by mouth 3 (three) times daily. Take one capsule by mouth three times daily. 270 capsule 3   glucose blood (TRUETEST TEST) test strip Use to test blood sugar ICD 10 E11 9 100 each 3   Multiple Vitamins-Minerals (EYE VITAMINS PO) Take by mouth. AREDS2     sitaGLIPtin (JANUVIA) 50 MG tablet Take 1 tablet (50 mg total) by mouth daily. 30 tablet 3   triamcinolone (KENALOG) 0.025 % cream APPLY 1 APPLICATION TOPICALLY TWICE DAILY AS NEEDED FOR DRY SKIN ON BACK 80 g 1   TRUEplus Lancets 33G MISC Use as directed to check glucose up to 2 times daily. E11.9 dx code 100 each 3   No current facility-administered medications for this visit.   Allergies: Allergies  Allergen Reactions    Lisinopril     Cough D/Ced by Dr WMelvyn Novas  Codeine     nausea   Verapamil     Pain in feet   I reviewed her past medical history, social history, family history, and environmental history and no significant changes have been reported from her previous visit.  Review of Systems  Constitutional:  Negative for appetite change, chills, fever and unexpected weight change.  HENT:  Negative for congestion and rhinorrhea.   Eyes:  Negative for itching.  Respiratory:  Negative for cough, chest tightness, shortness of breath and wheezing.   Cardiovascular:  Negative for chest pain.  Gastrointestinal:  Negative for abdominal pain.  Genitourinary:  Negative for difficulty urinating.  Skin:  Positive for rash.  Allergic/Immunologic: Positive for environmental allergies and food allergies.  Neurological:  Negative for headaches.   Objective: There were no vitals taken for this visit. There is no height or weight on file to calculate BMI. Physical Exam Vitals and nursing note reviewed.  Constitutional:      Appearance: Normal appearance. She is well-developed.  HENT:     Head: Normocephalic and atraumatic.     Right Ear: Tympanic membrane and external ear normal.     Left Ear: Tympanic membrane and external ear normal.  Nose: Nose normal.     Mouth/Throat:     Mouth: Mucous membranes are moist.     Pharynx: Oropharynx is clear.  Eyes:     Conjunctiva/sclera: Conjunctivae normal.  Cardiovascular:     Rate and Rhythm: Normal rate and regular rhythm.     Heart sounds: Normal heart sounds. No murmur heard.   No friction rub. No gallop.  Pulmonary:     Effort: Pulmonary effort is normal.     Breath sounds: Normal breath sounds. No wheezing, rhonchi or rales.  Musculoskeletal:     Cervical back: Neck supple.  Skin:    General: Skin is warm and dry.     Findings: Rash present.     Comments: Erythematous hue on the upper posterior torso with flaky dry skin.  Neurological:     Mental  Status: She is alert and oriented to person, place, and time.  Psychiatric:        Behavior: Behavior normal.   Previous notes and tests were reviewed. The plan was reviewed with the patient/family, and all questions/concerned were addressed.  It was my pleasure to see Alexa [name] today and participate in her care. Please feel free to contact me with any questions or concerns.  Sincerely,  Rexene Alberts, DO Allergy & Immunology  [facility] office: [place] office: [phone]

## 2021-05-04 DIAGNOSIS — Z7189 Other specified counseling: Secondary | ICD-10-CM | POA: Diagnosis not present

## 2021-05-04 DIAGNOSIS — E1169 Type 2 diabetes mellitus with other specified complication: Secondary | ICD-10-CM | POA: Diagnosis not present

## 2021-05-04 DIAGNOSIS — Z91018 Allergy to other foods: Secondary | ICD-10-CM | POA: Diagnosis not present

## 2021-05-04 DIAGNOSIS — Z85528 Personal history of other malignant neoplasm of kidney: Secondary | ICD-10-CM | POA: Diagnosis not present

## 2021-05-04 DIAGNOSIS — M549 Dorsalgia, unspecified: Secondary | ICD-10-CM | POA: Diagnosis not present

## 2021-05-04 DIAGNOSIS — I10 Essential (primary) hypertension: Secondary | ICD-10-CM | POA: Diagnosis not present

## 2021-05-04 DIAGNOSIS — L309 Dermatitis, unspecified: Secondary | ICD-10-CM | POA: Diagnosis not present

## 2021-05-04 DIAGNOSIS — F0390 Unspecified dementia without behavioral disturbance: Secondary | ICD-10-CM | POA: Diagnosis not present

## 2021-06-07 DIAGNOSIS — E1169 Type 2 diabetes mellitus with other specified complication: Secondary | ICD-10-CM | POA: Diagnosis not present

## 2021-06-07 DIAGNOSIS — M549 Dorsalgia, unspecified: Secondary | ICD-10-CM | POA: Diagnosis not present

## 2021-06-07 DIAGNOSIS — Z85528 Personal history of other malignant neoplasm of kidney: Secondary | ICD-10-CM | POA: Diagnosis not present

## 2021-06-07 DIAGNOSIS — I10 Essential (primary) hypertension: Secondary | ICD-10-CM | POA: Diagnosis not present

## 2021-06-07 DIAGNOSIS — Z91018 Allergy to other foods: Secondary | ICD-10-CM | POA: Diagnosis not present

## 2021-06-07 DIAGNOSIS — F0390 Unspecified dementia without behavioral disturbance: Secondary | ICD-10-CM | POA: Diagnosis not present

## 2021-06-07 DIAGNOSIS — L309 Dermatitis, unspecified: Secondary | ICD-10-CM | POA: Diagnosis not present

## 2021-06-19 DIAGNOSIS — J069 Acute upper respiratory infection, unspecified: Secondary | ICD-10-CM | POA: Diagnosis not present

## 2021-06-19 DIAGNOSIS — E559 Vitamin D deficiency, unspecified: Secondary | ICD-10-CM | POA: Diagnosis not present

## 2021-06-19 DIAGNOSIS — E1169 Type 2 diabetes mellitus with other specified complication: Secondary | ICD-10-CM | POA: Diagnosis not present

## 2021-06-19 DIAGNOSIS — Z85528 Personal history of other malignant neoplasm of kidney: Secondary | ICD-10-CM | POA: Diagnosis not present

## 2021-06-19 DIAGNOSIS — F0390 Unspecified dementia without behavioral disturbance: Secondary | ICD-10-CM | POA: Diagnosis not present

## 2021-06-19 DIAGNOSIS — R002 Palpitations: Secondary | ICD-10-CM | POA: Diagnosis not present

## 2021-06-19 DIAGNOSIS — I10 Essential (primary) hypertension: Secondary | ICD-10-CM | POA: Diagnosis not present

## 2021-06-19 DIAGNOSIS — Z91018 Allergy to other foods: Secondary | ICD-10-CM | POA: Diagnosis not present

## 2021-06-19 DIAGNOSIS — R051 Acute cough: Secondary | ICD-10-CM | POA: Diagnosis not present

## 2021-07-09 ENCOUNTER — Ambulatory Visit: Payer: Medicare HMO | Admitting: Allergy

## 2021-07-26 DIAGNOSIS — M545 Low back pain, unspecified: Secondary | ICD-10-CM | POA: Diagnosis not present

## 2021-07-26 DIAGNOSIS — M25552 Pain in left hip: Secondary | ICD-10-CM | POA: Diagnosis not present

## 2021-07-26 DIAGNOSIS — M5451 Vertebrogenic low back pain: Secondary | ICD-10-CM | POA: Diagnosis not present

## 2021-08-08 ENCOUNTER — Other Ambulatory Visit: Payer: Self-pay | Admitting: Family Medicine

## 2021-09-14 DIAGNOSIS — F0390 Unspecified dementia without behavioral disturbance: Secondary | ICD-10-CM | POA: Diagnosis not present

## 2021-09-14 DIAGNOSIS — I10 Essential (primary) hypertension: Secondary | ICD-10-CM | POA: Diagnosis not present

## 2021-09-14 DIAGNOSIS — E1169 Type 2 diabetes mellitus with other specified complication: Secondary | ICD-10-CM | POA: Diagnosis not present

## 2021-09-14 DIAGNOSIS — R2681 Unsteadiness on feet: Secondary | ICD-10-CM | POA: Diagnosis not present

## 2021-09-14 DIAGNOSIS — Z85528 Personal history of other malignant neoplasm of kidney: Secondary | ICD-10-CM | POA: Diagnosis not present

## 2021-09-14 DIAGNOSIS — Z91018 Allergy to other foods: Secondary | ICD-10-CM | POA: Diagnosis not present

## 2021-09-14 DIAGNOSIS — L309 Dermatitis, unspecified: Secondary | ICD-10-CM | POA: Diagnosis not present

## 2021-09-14 DIAGNOSIS — Z7189 Other specified counseling: Secondary | ICD-10-CM | POA: Diagnosis not present

## 2021-09-14 DIAGNOSIS — M549 Dorsalgia, unspecified: Secondary | ICD-10-CM | POA: Diagnosis not present

## 2021-10-10 ENCOUNTER — Telehealth: Payer: Self-pay | Admitting: Allergy

## 2021-10-10 NOTE — Telephone Encounter (Signed)
Attempted to call patient again, there was no answer and was not able to leave a voicemail.  ?

## 2021-10-10 NOTE — Telephone Encounter (Signed)
Patient  called and said that she is having breakouts with hives and they are itching. Want to know if there is a shot or something to help [facility] on [address]. [phone] ?

## 2021-10-10 NOTE — Telephone Encounter (Signed)
Attempted to call back patient on mobile number and house number, both calls rang continuously and there was no answer. Patient needs to come in to be seen and have her hives evaluated, she has cancelled her last two follow up appointments. Dr. Maudie Mercury has an opening in [place] today at 3:15pm.  ?

## 2021-10-11 NOTE — Telephone Encounter (Signed)
Attempted to call patient, there was no answer and was unable to leave a voicemail.  ?

## 2021-10-12 DIAGNOSIS — F419 Anxiety disorder, unspecified: Secondary | ICD-10-CM | POA: Diagnosis not present

## 2021-10-12 DIAGNOSIS — R300 Dysuria: Secondary | ICD-10-CM | POA: Diagnosis not present

## 2021-10-12 DIAGNOSIS — R2681 Unsteadiness on feet: Secondary | ICD-10-CM | POA: Diagnosis not present

## 2021-10-12 DIAGNOSIS — Z91018 Allergy to other foods: Secondary | ICD-10-CM | POA: Diagnosis not present

## 2021-10-12 DIAGNOSIS — L309 Dermatitis, unspecified: Secondary | ICD-10-CM | POA: Diagnosis not present

## 2021-10-12 DIAGNOSIS — M549 Dorsalgia, unspecified: Secondary | ICD-10-CM | POA: Diagnosis not present

## 2021-10-12 DIAGNOSIS — M1990 Unspecified osteoarthritis, unspecified site: Secondary | ICD-10-CM | POA: Diagnosis not present

## 2021-10-12 DIAGNOSIS — I10 Essential (primary) hypertension: Secondary | ICD-10-CM | POA: Diagnosis not present

## 2021-10-12 DIAGNOSIS — E1169 Type 2 diabetes mellitus with other specified complication: Secondary | ICD-10-CM | POA: Diagnosis not present

## 2021-10-15 NOTE — Telephone Encounter (Signed)
Attempted to call patient, there was no answer, was unable to leave voicemail.  ?

## 2021-11-19 DIAGNOSIS — N390 Urinary tract infection, site not specified: Secondary | ICD-10-CM | POA: Diagnosis not present

## 2021-11-19 DIAGNOSIS — C659 Malignant neoplasm of unspecified renal pelvis: Secondary | ICD-10-CM | POA: Diagnosis not present

## 2021-12-14 ENCOUNTER — Inpatient Hospital Stay (HOSPITAL_COMMUNITY)
Admission: EM | Admit: 2021-12-14 | Discharge: 2021-12-19 | DRG: 522 | Disposition: A | Discharge status: Skilled Nursing Facility | Payer: Medicare HMO | Attending: Internal Medicine | Admitting: Internal Medicine

## 2021-12-14 ENCOUNTER — Other Ambulatory Visit: Payer: Self-pay

## 2021-12-14 ENCOUNTER — Emergency Department (HOSPITAL_COMMUNITY): Payer: Medicare HMO

## 2021-12-14 ENCOUNTER — Encounter (HOSPITAL_COMMUNITY): Payer: Self-pay

## 2021-12-14 DIAGNOSIS — M25572 Pain in left ankle and joints of left foot: Secondary | ICD-10-CM | POA: Diagnosis not present

## 2021-12-14 DIAGNOSIS — E1122 Type 2 diabetes mellitus with diabetic chronic kidney disease: Secondary | ICD-10-CM | POA: Diagnosis present

## 2021-12-14 DIAGNOSIS — Z86711 Personal history of pulmonary embolism: Secondary | ICD-10-CM | POA: Diagnosis not present

## 2021-12-14 DIAGNOSIS — I10 Essential (primary) hypertension: Secondary | ICD-10-CM | POA: Diagnosis not present

## 2021-12-14 DIAGNOSIS — B9620 Unspecified Escherichia coli [E. coli] as the cause of diseases classified elsewhere: Secondary | ICD-10-CM | POA: Diagnosis present

## 2021-12-14 DIAGNOSIS — Z801 Family history of malignant neoplasm of trachea, bronchus and lung: Secondary | ICD-10-CM | POA: Diagnosis not present

## 2021-12-14 DIAGNOSIS — Z8249 Family history of ischemic heart disease and other diseases of the circulatory system: Secondary | ICD-10-CM

## 2021-12-14 DIAGNOSIS — S72011A Unspecified intracapsular fracture of right femur, initial encounter for closed fracture: Secondary | ICD-10-CM | POA: Diagnosis present

## 2021-12-14 DIAGNOSIS — Z85528 Personal history of other malignant neoplasm of kidney: Secondary | ICD-10-CM | POA: Diagnosis not present

## 2021-12-14 DIAGNOSIS — Y92000 Kitchen of unspecified non-institutional (private) residence as  the place of occurrence of the external cause: Secondary | ICD-10-CM | POA: Diagnosis not present

## 2021-12-14 DIAGNOSIS — Z818 Family history of other mental and behavioral disorders: Secondary | ICD-10-CM

## 2021-12-14 DIAGNOSIS — Z91018 Allergy to other foods: Secondary | ICD-10-CM | POA: Diagnosis not present

## 2021-12-14 DIAGNOSIS — I129 Hypertensive chronic kidney disease with stage 1 through stage 4 chronic kidney disease, or unspecified chronic kidney disease: Secondary | ICD-10-CM | POA: Diagnosis present

## 2021-12-14 DIAGNOSIS — Z7982 Long term (current) use of aspirin: Secondary | ICD-10-CM | POA: Diagnosis not present

## 2021-12-14 DIAGNOSIS — Z86718 Personal history of other venous thrombosis and embolism: Secondary | ICD-10-CM

## 2021-12-14 DIAGNOSIS — K219 Gastro-esophageal reflux disease without esophagitis: Secondary | ICD-10-CM | POA: Diagnosis present

## 2021-12-14 DIAGNOSIS — Z96641 Presence of right artificial hip joint: Secondary | ICD-10-CM | POA: Diagnosis not present

## 2021-12-14 DIAGNOSIS — Z823 Family history of stroke: Secondary | ICD-10-CM | POA: Diagnosis not present

## 2021-12-14 DIAGNOSIS — R7989 Other specified abnormal findings of blood chemistry: Secondary | ICD-10-CM

## 2021-12-14 DIAGNOSIS — W010XXA Fall on same level from slipping, tripping and stumbling without subsequent striking against object, initial encounter: Secondary | ICD-10-CM | POA: Diagnosis present

## 2021-12-14 DIAGNOSIS — R778 Other specified abnormalities of plasma proteins: Secondary | ICD-10-CM | POA: Diagnosis not present

## 2021-12-14 DIAGNOSIS — I455 Other specified heart block: Secondary | ICD-10-CM | POA: Diagnosis present

## 2021-12-14 DIAGNOSIS — N3000 Acute cystitis without hematuria: Secondary | ICD-10-CM

## 2021-12-14 DIAGNOSIS — N1831 Chronic kidney disease, stage 3a: Secondary | ICD-10-CM | POA: Diagnosis present

## 2021-12-14 DIAGNOSIS — E785 Hyperlipidemia, unspecified: Secondary | ICD-10-CM | POA: Diagnosis present

## 2021-12-14 DIAGNOSIS — Z66 Do not resuscitate: Secondary | ICD-10-CM | POA: Diagnosis present

## 2021-12-14 DIAGNOSIS — I639 Cerebral infarction, unspecified: Secondary | ICD-10-CM | POA: Diagnosis not present

## 2021-12-14 DIAGNOSIS — M25551 Pain in right hip: Secondary | ICD-10-CM | POA: Diagnosis not present

## 2021-12-14 DIAGNOSIS — D62 Acute posthemorrhagic anemia: Secondary | ICD-10-CM

## 2021-12-14 DIAGNOSIS — Z905 Acquired absence of kidney: Secondary | ICD-10-CM | POA: Diagnosis not present

## 2021-12-14 DIAGNOSIS — J45991 Cough variant asthma: Secondary | ICD-10-CM | POA: Diagnosis not present

## 2021-12-14 DIAGNOSIS — R000 Tachycardia, unspecified: Secondary | ICD-10-CM | POA: Diagnosis not present

## 2021-12-14 DIAGNOSIS — R338 Other retention of urine: Secondary | ICD-10-CM

## 2021-12-14 DIAGNOSIS — Z471 Aftercare following joint replacement surgery: Secondary | ICD-10-CM | POA: Diagnosis not present

## 2021-12-14 DIAGNOSIS — Z833 Family history of diabetes mellitus: Secondary | ICD-10-CM | POA: Diagnosis not present

## 2021-12-14 DIAGNOSIS — R079 Chest pain, unspecified: Secondary | ICD-10-CM | POA: Diagnosis not present

## 2021-12-14 DIAGNOSIS — M4802 Spinal stenosis, cervical region: Secondary | ICD-10-CM | POA: Diagnosis present

## 2021-12-14 DIAGNOSIS — S0990XA Unspecified injury of head, initial encounter: Secondary | ICD-10-CM | POA: Diagnosis not present

## 2021-12-14 DIAGNOSIS — Z7984 Long term (current) use of oral hypoglycemic drugs: Secondary | ICD-10-CM | POA: Diagnosis not present

## 2021-12-14 DIAGNOSIS — S72001A Fracture of unspecified part of neck of right femur, initial encounter for closed fracture: Secondary | ICD-10-CM | POA: Diagnosis not present

## 2021-12-14 DIAGNOSIS — R531 Weakness: Secondary | ICD-10-CM | POA: Diagnosis not present

## 2021-12-14 DIAGNOSIS — J449 Chronic obstructive pulmonary disease, unspecified: Secondary | ICD-10-CM | POA: Diagnosis present

## 2021-12-14 DIAGNOSIS — Z79899 Other long term (current) drug therapy: Secondary | ICD-10-CM

## 2021-12-14 DIAGNOSIS — C659 Malignant neoplasm of unspecified renal pelvis: Secondary | ICD-10-CM | POA: Diagnosis not present

## 2021-12-14 DIAGNOSIS — W19XXXA Unspecified fall, initial encounter: Secondary | ICD-10-CM | POA: Diagnosis not present

## 2021-12-14 DIAGNOSIS — E119 Type 2 diabetes mellitus without complications: Secondary | ICD-10-CM

## 2021-12-14 DIAGNOSIS — K5900 Constipation, unspecified: Secondary | ICD-10-CM | POA: Diagnosis not present

## 2021-12-14 DIAGNOSIS — Z7401 Bed confinement status: Secondary | ICD-10-CM | POA: Diagnosis not present

## 2021-12-14 DIAGNOSIS — R21 Rash and other nonspecific skin eruption: Secondary | ICD-10-CM | POA: Diagnosis not present

## 2021-12-14 LAB — CBC WITH DIFFERENTIAL/PLATELET
Abs Immature Granulocytes: 0.05 10*3/uL (ref 0.00–0.07)
Basophils Absolute: 0.1 10*3/uL (ref 0.0–0.1)
Basophils Relative: 1 %
Eosinophils Absolute: 0.4 10*3/uL (ref 0.0–0.5)
Eosinophils Relative: 4 %
HCT: 42.5 % (ref 36.0–46.0)
Hemoglobin: 14.8 g/dL (ref 12.0–15.0)
Immature Granulocytes: 1 %
Lymphocytes Relative: 19 %
Lymphs Abs: 1.9 10*3/uL (ref 0.7–4.0)
MCH: 30.9 pg (ref 26.0–34.0)
MCHC: 34.8 g/dL (ref 30.0–36.0)
MCV: 88.7 fL (ref 80.0–100.0)
Monocytes Absolute: 0.7 10*3/uL (ref 0.1–1.0)
Monocytes Relative: 7 %
Neutro Abs: 6.9 10*3/uL (ref 1.7–7.7)
Neutrophils Relative %: 68 %
Platelets: 225 10*3/uL (ref 150–400)
RBC: 4.79 MIL/uL (ref 3.87–5.11)
RDW: 14.7 % (ref 11.5–15.5)
WBC: 9.9 10*3/uL (ref 4.0–10.5)
nRBC: 0 % (ref 0.0–0.2)

## 2021-12-14 LAB — URINALYSIS, ROUTINE W REFLEX MICROSCOPIC
Bilirubin Urine: NEGATIVE
Glucose, UA: NEGATIVE mg/dL
Hgb urine dipstick: NEGATIVE
Ketones, ur: NEGATIVE mg/dL
Nitrite: POSITIVE — AB
Protein, ur: NEGATIVE mg/dL
Specific Gravity, Urine: 1.013 (ref 1.005–1.030)
pH: 5 (ref 5.0–8.0)

## 2021-12-14 LAB — BASIC METABOLIC PANEL
Anion gap: 6 (ref 5–15)
BUN: 24 mg/dL — ABNORMAL HIGH (ref 8–23)
CO2: 26 mmol/L (ref 22–32)
Calcium: 8.6 mg/dL — ABNORMAL LOW (ref 8.9–10.3)
Chloride: 110 mmol/L (ref 98–111)
Creatinine, Ser: 0.99 mg/dL (ref 0.44–1.00)
GFR, Estimated: 54 mL/min — ABNORMAL LOW (ref >60)
Glucose, Bld: 146 mg/dL — ABNORMAL HIGH (ref 70–99)
Potassium: 3.7 mmol/L (ref 3.5–5.1)
Sodium: 142 mmol/L (ref 135–145)

## 2021-12-14 LAB — TROPONIN I (HIGH SENSITIVITY): Troponin I (High Sensitivity): 22 ng/L — ABNORMAL HIGH (ref <18)

## 2021-12-14 MED ORDER — MORPHINE SULFATE (PF) 4 MG/ML IV SOLN
4.0000 mg | Freq: Once | INTRAVENOUS | Status: AC
  Administered 2021-12-14: 4 mg via INTRAVENOUS
  Filled 2021-12-14: qty 1

## 2021-12-14 MED ORDER — ONDANSETRON HCL 4 MG/2ML IJ SOLN
4.0000 mg | Freq: Once | INTRAMUSCULAR | Status: AC
  Administered 2021-12-14: 4 mg via INTRAVENOUS
  Filled 2021-12-14: qty 2

## 2021-12-14 NOTE — ED Triage Notes (Signed)
Pt BIB EMS for a fall. Pt loss her balance at home and landed on her right hip. Per EMS pt rated her pain 10/10 w/ severe stabbing shooting pain". Someone had to help her get up and pt was not able to get off the couch on her own. Per EMS pt also reports "massive cramps in the right leg and hip. Pt BP elevated due to no bp meds taken  75 mcg of fentanyl given.   100 hr 22 rr 98 o2 180/90 bp

## 2021-12-14 NOTE — ED Provider Notes (Signed)
[name] Note   CSN: [id number] Arrival date & time: 12/14/21  2136     History  Chief Complaint  Patient presents with   Alexa [name] is a 88 y.o. female who presents with her son [name] at the bedside with concern for right hip pain after mechanical fall today.  She states that she was standing at the kitchen sink and when she went to turn she fell onto the wood floor landing directly on her right hip.  She endorses severe stabbing pain since that time and progressively worsening cramping in the right thigh.  Assisted from the ground by EMS and brought to the emergency department.  75 mics of fentanyl administered in route.  Patient denies hitting her head at all, but states she was "seeing stars" after she fell. She is not on blood thinners, lives alone and normally ambulates with a rollator chair at home.  Denies pain anywhere besides her right hip.  I personally reviewed her medical records which is history of renal cancer status post nephrectomy in 2007.  Type 2 diabetes, hypertension, hyperlipidemia, cervical spinal stenosis, personal history of PE.  She is not currently anticoagulated.  HPI     Home Medications Prior to Admission medications   Medication Sig Start Date End Date Taking? Authorizing Provider  amLODipine (NORVASC) 5 MG tablet Take 1 tablet (5 mg total) by mouth daily. 07/27/20   Copland, Gay Filler, MD  Ascorbic Acid (VITAMIN C) POWD Take 1 packet by mouth 2 (two) times daily as needed (cold symptoms).    [provider]  aspirin EC 81 MG tablet Take 162 mg by mouth daily.    [provider]  Benzalkonium Chloride (DIABETIC BASICS HEALTHY FOOT EX) Apply 1 application topically daily as needed (foot pain). OTC diabetic foot cream    [provider]  Blood Glucose Monitoring Suppl (TRUE METRIX METER) w/Device KIT Use as directed to monitor glucose up to 2 times daily. E11.9 dx code 10/04/20    Copland, Gay Filler, MD  busPIRone (BUSPAR) 7.5 MG tablet Take 2 tablets (15 mg total) by mouth 2 (two) times daily. May take one 4x a day if preferred 04/28/20   Copland, Gay Filler, MD  CALCIUM PO Take 1 tablet by mouth daily.    [provider]  cetirizine (ZYRTEC ALLERGY) 10 MG tablet Take 1/2 tablet to 1 tablet daily. 01/19/21   Garnet Sierras, DO  Cholecalciferol (VITAMIN D3) 1.25 MG (50000 UT) CAPS Take 1 weekly for 12 weeks 10/18/20   Copland, Gay Filler, MD  CRANBERRY PO Take 2 capsules by mouth 2 (two) times daily. 40m    [provider]  EPINEPHrine 0.3 mg/0.3 mL IJ SOAJ injection Inject 0.3 mg into the muscle as needed for anaphylaxis. 01/19/21   KGarnet Sierras DO  ESTRACE VAGINAL 0.1 MG/GM vaginal cream Apply locally every other night at bedtime 01/16/16   [provider]  fluticasone (FLONASE) 50 MCG/ACT nasal spray INSTILL 2 SPRAYS INTO EACH NOSTRIL ONCE DAILY 04/28/20   Copland, JGay Filler MD  gabapentin (NEURONTIN) 100 MG capsule Take 1 capsule (100 mg total) by mouth 3 (three) times daily. Take one capsule by mouth three times daily. 04/28/20   Copland, JGay Filler MD  glucose blood (TRUETEST TEST) test strip Use to test blood sugar ICD 10 E11 9 07/14/14   HHendricks Limes MD  Multiple Vitamins-Minerals (EYE VITAMINS PO) Take by mouth. AREDS2  [provider]  sitaGLIPtin (JANUVIA) 50 MG tablet Take 1 tablet (50 mg total) by mouth daily. 10/19/20   Copland, Gay Filler, MD  triamcinolone (KENALOG) 0.025 % cream APPLY 1 APPLICATION TOPICALLY TWICE DAILY AS NEEDED FOR DRY SKIN ON BACK 04/28/20   Copland, Gay Filler, MD  TRUEplus Lancets 33G MISC Use as directed to check glucose up to 2 times daily. E11.9 dx code 10/04/20   Copland, Gay Filler, MD      Allergies    Lisinopril, Codeine, and Verapamil    Review of Systems   Review of Systems  Constitutional: Negative.   HENT: Negative.    Eyes: Negative.   Respiratory: Negative.    Cardiovascular: Negative.    Gastrointestinal: Negative.   Genitourinary: Negative.   Musculoskeletal:  Positive for myalgias.  Neurological: Negative.    Physical Exam Updated Vital Signs BP (!) 159/95   Pulse 94   Temp 98.6 F (37 C) (Oral)   Resp 17   Ht 5' 1"  (1.549 m)   Wt 53.5 kg   SpO2 96%   BMI 22.30 kg/m  Physical Exam Vitals and nursing note reviewed.  Constitutional:      Appearance: She is not ill-appearing or toxic-appearing.  HENT:     Head: Normocephalic and atraumatic.     Nose: Nose normal.     Mouth/Throat:     Mouth: Mucous membranes are moist.     Pharynx: Oropharynx is clear. Uvula midline. No oropharyngeal exudate or posterior oropharyngeal erythema.  Eyes:     General: Lids are normal. Vision grossly intact.        Right eye: No discharge.        Left eye: No discharge.     Extraocular Movements: Extraocular movements intact.     Conjunctiva/sclera: Conjunctivae normal.     Pupils: Pupils are equal, round, and reactive to light.  Neck:     Trachea: Trachea and phonation normal.  Cardiovascular:     Rate and Rhythm: Normal rate and regular rhythm.     Pulses: Normal pulses.     Heart sounds: Normal heart sounds. No murmur heard. Pulmonary:     Effort: Pulmonary effort is normal. No tachypnea, bradypnea, accessory muscle usage, prolonged expiration or respiratory distress.     Breath sounds: Normal breath sounds. No wheezing or rales.  Chest:     Chest wall: No mass, lacerations, deformity, swelling, tenderness, crepitus or edema.  Abdominal:     General: Bowel sounds are normal. There is no distension.     Palpations: Abdomen is soft.     Tenderness: There is no abdominal tenderness. There is no right CVA tenderness, left CVA tenderness, guarding or rebound.  Musculoskeletal:        General: No deformity.     Right shoulder: Normal.     Left shoulder: Normal.     Right upper arm: Normal.     Left upper arm: Normal.     Right elbow: Normal.     Left elbow: Normal.      Right forearm: Normal.     Left forearm: Normal.     Right wrist: Normal.     Left wrist: Normal.     Right hand: Normal.     Left hand: Normal.     Cervical back: Normal, normal range of motion and neck supple. No bony tenderness.     Thoracic back: Normal. No bony tenderness.     Lumbar back: Normal. No bony tenderness.  Right hip: Tenderness present. No deformity or crepitus. Decreased range of motion.     Left hip: Normal.     Right upper leg: Normal.     Left upper leg: Normal.     Right knee: Normal.     Left knee: Normal.     Right lower leg: Normal. No edema.     Left lower leg: Normal. No edema.     Right ankle: Normal.     Right Achilles Tendon: Normal.     Left ankle: Normal.     Left Achilles Tendon: Normal.     Right foot: Normal.     Left foot: Normal.  Lymphadenopathy:     Cervical: No cervical adenopathy.  Skin:    General: Skin is warm and dry.     Capillary Refill: Capillary refill takes less than 2 seconds.  Neurological:     General: No focal deficit present.     Mental Status: She is alert and oriented to person, place, and time. Mental status is at baseline.     GCS: GCS eye subscore is 4. GCS verbal subscore is 5. GCS motor subscore is 6.     Cranial Nerves: Cranial nerves 2-12 are intact.  Psychiatric:        Mood and Affect: Mood normal.    ED Results / Procedures / Treatments   Labs (all labs ordered are listed, but only abnormal results are displayed) Labs Reviewed  URINALYSIS, ROUTINE W REFLEX MICROSCOPIC - Abnormal; Notable for the following components:      Result Value   APPearance HAZY (*)    Nitrite POSITIVE (*)    Leukocytes,Ua MODERATE (*)    Bacteria, UA MANY (*)    All other components within normal limits  BASIC METABOLIC PANEL - Abnormal; Notable for the following components:   Glucose, Bld 146 (*)    BUN 24 (*)    Calcium 8.6 (*)    GFR, Estimated 54 (*)    All other components within normal limits  TROPONIN I (HIGH  SENSITIVITY) - Abnormal; Notable for the following components:   Troponin I (High Sensitivity) 22 (*)    All other components within normal limits  TROPONIN I (HIGH SENSITIVITY) - Abnormal; Notable for the following components:   Troponin I (High Sensitivity) 29 (*)    All other components within normal limits  CBC WITH DIFFERENTIAL/PLATELET    EKG EKG Interpretation  Date/Time:  Friday Dec 14 2021 22:31:28 EDT Ventricular Rate:  97 PR Interval:  221 QRS Duration: 81 QT Interval:  359 QTC Calculation: 456 R Axis:   -54 Text Interpretation: Sinus rhythm Prolonged PR interval Left ventricular hypertrophy Inferior infarct, old No significant change since last tracing Confirmed by Isla Pence [id number]) on 12/14/2021 11:29:54 PM  Radiology DG Chest 1 View  Result Date: 12/14/2021 CLINICAL DATA:  Fall with chest and right hip pain. EXAM: DG HIP (WITH OR WITHOUT PELVIS) 2-3V RIGHT; CHEST  1 VIEW COMPARISON:  PA chest and rib films 05/31/2020, AP pelvis and bilateral hip films 10/22/2017. FINDINGS: Chest: The heart is slightly enlarged. There is chronic aortic tortuosity and atherosclerosis. Stable mediastinal configuration. No vascular congestion is seen.  The lungs are clear of infiltrates. The sulci are sharp. Mild chronic interstitial change both lateral bases. Osteopenia and slight lumbar levoscoliosis. No acute osseous abnormality is seen. AP pelvis and AP and ladder left hip views: There is mild osteopenia. Although a discrete cortical step-off is not seen, there is an interval  shortened appearance to the right femoral neck concerning for an impacted transverse subcapital proximal right femoral fracture. Also possible this could have occurred since 10/22/2017 and healed. The proximal left femur is intact AP. No pelvic fracture or diastasis is seen. There are mild features of degenerative arthrosis of the hips. There is advanced degenerative change of the visualized lower lumbar spine again  noted. IMPRESSION: 1. No evidence of acute chest disease.  Mild cardiomegaly. 2. Interval new shortened appearance of the right femoral neck since March 2019. This is worrisome for a subcapital transverse impacted femoral neck fracture although no focal cortical step-off is visible. Consider CT to assess chronicity as this could be chronic. 3. Aortic atherosclerosis. Electronically Signed   By: Telford Nab M.D.   On: 12/14/2021 23:16   CT Head Wo Contrast  Result Date: 12/14/2021 CLINICAL DATA:  Head trauma, minor (Age >= 65y). EXAM: CT HEAD WITHOUT CONTRAST TECHNIQUE: Contiguous axial images were obtained from the base of the skull through the vertex without intravenous contrast. RADIATION DOSE REDUCTION: This exam was performed according to the departmental dose-optimization program which includes automated exposure control, adjustment of the mA and/or kV according to patient size and/or use of iterative reconstruction technique. COMPARISON:  06/06/2017 FINDINGS: Brain: Normal anatomic configuration. Parenchymal volume loss is commensurate with the patient's age. Mild periventricular white matter changes are present likely reflecting the sequela of small vessel ischemia. Remote lacunar infarcts within the left thalamus and right paramedian cerebellar infarct are again identified. No abnormal intra or extra-axial mass lesion or fluid collection. No abnormal mass effect or midline shift. No evidence of acute intracranial hemorrhage or infarct. Ventricular size is normal. Cerebellum unremarkable. Vascular: No asymmetric hyperdense vasculature at the skull base. Skull: Intact Sinuses/Orbits: Paranasal sinuses are clear. Ocular lenses have been removed. Orbits are otherwise unremarkable. Other: Mastoid air cells and middle ear cavities are clear. IMPRESSION: No acute intracranial abnormality. Stable senescent change and remote infarcts as outlined above. Electronically Signed   By: Fidela Salisbury M.D.   On:  12/14/2021 23:21   CT Hip Right Wo Contrast  Result Date: 12/15/2021 CLINICAL DATA:  Hip trauma, fracture suspected, pain 10/10. EXAM: CT OF THE RIGHT HIP WITHOUT CONTRAST TECHNIQUE: Multidetector CT imaging of the right hip was performed according to the standard protocol. Multiplanar CT image reconstructions were also generated. RADIATION DOSE REDUCTION: This exam was performed according to the departmental dose-optimization program which includes automated exposure control, adjustment of the mA and/or kV according to patient size and/or use of iterative reconstruction technique. COMPARISON:  AP pelvis and right hip series yesterday, AP pelvis and bilateral hip series 10/22/2017. FINDINGS: Bones/Joint/Cartilage Bone mineralization is osteopenic. There is an acute, closed impacted and otherwise nondisplaced transverse oblique fracture of the proximal right humerus with small comminution fragments at the anterior and posterior fracture margins. There are cystic intermixed with sclerotic changes in the posterior subarticular femoral head consistent with osteonecrosis which has a chronic appearance with slight deformity of the overlying cortical surface. There is partial superior joint space loss at the hip with small acetabular and femoral head osteophytes consistent with degenerative arthrosis. There is mild spurring of the pubic symphysis. There are no further fractures in the scanned structures. There appears to be a small right hip joint hemarthrosis. Ligaments Suboptimally evaluated by CT. Muscles and Tendons There is normal muscle bulk. No intramuscular hematoma is seen. Area tendons are intact as far seen but not optimally well evaluated. Soft tissues Visualized portions of the right  hemipelvis are unremarkable apart from moderate bladder dilatation. There is no free fluid, free air or free hemorrhage. There is stranding in the subcutaneous plane in the right buttock inferiorly probably posttraumatic  etiology. Additional stranding lateral to the proximal right femur. There is mild scattered calcific plaque in the right external iliac, common iliac and proximal superficial femoral arteries. IMPRESSION: 1. Acute transverse oblique subcapital proximal right femoral fracture, impacted and otherwise not displaced. 2. Osteopenia and degenerative change. 3. Sclerotic and cystic changes in posterior femoral head consistent with likely chronic osteonecrosis. 4. Suspected small right hip joint hemarthrosis. 5. Subcutaneous stranding likely posttraumatic. 6. Bladder dilatation not fully visualized. 7. Remaining findings discussed above. Electronically Signed   By: Telford Nab M.D.   On: 12/15/2021 02:05   DG HIP UNILAT WITH PELVIS 2-3 VIEWS RIGHT  Result Date: 12/14/2021 CLINICAL DATA:  Fall with chest and right hip pain. EXAM: DG HIP (WITH OR WITHOUT PELVIS) 2-3V RIGHT; CHEST  1 VIEW COMPARISON:  PA chest and rib films 05/31/2020, AP pelvis and bilateral hip films 10/22/2017. FINDINGS: Chest: The heart is slightly enlarged. There is chronic aortic tortuosity and atherosclerosis. Stable mediastinal configuration. No vascular congestion is seen.  The lungs are clear of infiltrates. The sulci are sharp. Mild chronic interstitial change both lateral bases. Osteopenia and slight lumbar levoscoliosis. No acute osseous abnormality is seen. AP pelvis and AP and ladder left hip views: There is mild osteopenia. Although a discrete cortical step-off is not seen, there is an interval shortened appearance to the right femoral neck concerning for an impacted transverse subcapital proximal right femoral fracture. Also possible this could have occurred since 10/22/2017 and healed. The proximal left femur is intact AP. No pelvic fracture or diastasis is seen. There are mild features of degenerative arthrosis of the hips. There is advanced degenerative change of the visualized lower lumbar spine again noted. IMPRESSION: 1. No  evidence of acute chest disease.  Mild cardiomegaly. 2. Interval new shortened appearance of the right femoral neck since March 2019. This is worrisome for a subcapital transverse impacted femoral neck fracture although no focal cortical step-off is visible. Consider CT to assess chronicity as this could be chronic. 3. Aortic atherosclerosis. Electronically Signed   By: Telford Nab M.D.   On: 12/14/2021 23:16    Procedures Procedures    Medications Ordered in ED Medications  morphine (PF) 4 MG/ML injection 4 mg (4 mg Intravenous Given 12/14/21 2225)  ondansetron (ZOFRAN) injection 4 mg (4 mg Intravenous Given 12/14/21 2225)  cefTRIAXone (ROCEPHIN) 1 g in sodium chloride 0.9 % 100 mL IVPB (0 g Intravenous Stopped 12/15/21 0124)  HYDROmorphone (DILAUDID) injection 0.5 mg (0.5 mg Intravenous Given 12/15/21 0051)    ED Course/ Medical Decision Making/ A&P Clinical Course as of 12/15/21 0329  Sat Dec 15, 2021  0302 Consult to Dr. Rolena Infante, [facility], who agrees with plan for medical admission at [facility] at this time. I appreciate his collaboration in the care of this patient.  [RS]  [phone] Consult to Dr. Myna Hidalgo, hospitalist, who is agreeable to admitting this patient to his service.  Appreciate his collaboration in the care of this patient. [RS]    Clinical Course User Index [RS] Aura Dials                           Medical Decision Making 88 year old female presents after mechanical fall with concern for right hip pain and lower extremity cramping.  Hypertensive on intake, did not take her blood pressure medications today and is in exquisite pain.  Vital signs otherwise normal.  Cardiopulmonary and abdominal exams are benign.  No evidence of trauma on the chest or abdomen or upper extremities.  Exquisite tenderness palpation of the right hip as above with normal neurovascular status in bilateral legs.  No midline tenderness of the spine.  GCS 15.     Amount and/or Complexity  of Data Reviewed Labs: ordered.    Details: CBC unremarkable, BMP with mild elevation in BUN to 24.  UA with nitrate positive, many bacteria concerning for infection.  Troponin mildly elevated to 22 and subsequently again to 29. Patient remained chest pain-free without changes in her EKG. Radiology: ordered.    Details: Chest x-ray negative for acute cardiopulmonary disease.  Hip film with interval shortened appearance of the right femoral neck worrisome for subcapital impacted fracture.  CT of the head negative for acute intracranial abnormality.  CT of the hip with confirmed acute transverse/oblique subcapital right femoral fracture that is impacted but otherwise not displaced.  All images visualized by this provider. ECG/medicine tests: ordered.    Details: EKG with SR, no STEMI  Risk Prescription drug management. Decision regarding hospitalization.    Consult to orthopedics as above.  Consult to hospitalist as well. Patient is a overall very well 88 year old female who lives alone and is ambulatory independently.  Will require admission to the hospital to medicine service for definitive repair of right hip fracture.  Was administered IV antibiotics for urinary tract infection in the emergency department.  Will require ongoing antibiotics to manage this infection as well.  Markayla voiced understanding of her medical evaluation and treatment plan.  Each of her questions answered to her expressed affection.  She is amenable to plan for admission at this time.  This chart was dictated using voice recognition software, Dragon. Despite the best efforts of this provider to proofread and correct errors, errors may still occur which can change documentation meaning.  Final Clinical Impression(s) / ED Diagnoses Final diagnoses:  Closed fracture of right hip, initial encounter [facility])    Rx / DC Orders ED Discharge Orders     None         Aura Dials 12/15/21 0329     Isla Pence, MD 12/17/21 1504

## 2021-12-15 ENCOUNTER — Emergency Department (HOSPITAL_COMMUNITY): Payer: Medicare HMO

## 2021-12-15 ENCOUNTER — Encounter (HOSPITAL_COMMUNITY): Payer: Self-pay | Admitting: Family Medicine

## 2021-12-15 DIAGNOSIS — Z823 Family history of stroke: Secondary | ICD-10-CM | POA: Diagnosis not present

## 2021-12-15 DIAGNOSIS — E785 Hyperlipidemia, unspecified: Secondary | ICD-10-CM | POA: Diagnosis present

## 2021-12-15 DIAGNOSIS — Z86718 Personal history of other venous thrombosis and embolism: Secondary | ICD-10-CM | POA: Diagnosis not present

## 2021-12-15 DIAGNOSIS — Z801 Family history of malignant neoplasm of trachea, bronchus and lung: Secondary | ICD-10-CM | POA: Diagnosis not present

## 2021-12-15 DIAGNOSIS — Z66 Do not resuscitate: Secondary | ICD-10-CM | POA: Diagnosis present

## 2021-12-15 DIAGNOSIS — K219 Gastro-esophageal reflux disease without esophagitis: Secondary | ICD-10-CM | POA: Diagnosis present

## 2021-12-15 DIAGNOSIS — J449 Chronic obstructive pulmonary disease, unspecified: Secondary | ICD-10-CM | POA: Diagnosis present

## 2021-12-15 DIAGNOSIS — D62 Acute posthemorrhagic anemia: Secondary | ICD-10-CM | POA: Diagnosis not present

## 2021-12-15 DIAGNOSIS — N1831 Chronic kidney disease, stage 3a: Secondary | ICD-10-CM | POA: Diagnosis present

## 2021-12-15 DIAGNOSIS — E119 Type 2 diabetes mellitus without complications: Secondary | ICD-10-CM | POA: Diagnosis not present

## 2021-12-15 DIAGNOSIS — N3000 Acute cystitis without hematuria: Secondary | ICD-10-CM

## 2021-12-15 DIAGNOSIS — Z79899 Other long term (current) drug therapy: Secondary | ICD-10-CM | POA: Diagnosis not present

## 2021-12-15 DIAGNOSIS — M4802 Spinal stenosis, cervical region: Secondary | ICD-10-CM | POA: Diagnosis present

## 2021-12-15 DIAGNOSIS — I10 Essential (primary) hypertension: Secondary | ICD-10-CM | POA: Diagnosis not present

## 2021-12-15 DIAGNOSIS — Y92000 Kitchen of unspecified non-institutional (private) residence as  the place of occurrence of the external cause: Secondary | ICD-10-CM | POA: Diagnosis not present

## 2021-12-15 DIAGNOSIS — I129 Hypertensive chronic kidney disease with stage 1 through stage 4 chronic kidney disease, or unspecified chronic kidney disease: Secondary | ICD-10-CM | POA: Diagnosis present

## 2021-12-15 DIAGNOSIS — R778 Other specified abnormalities of plasma proteins: Secondary | ICD-10-CM

## 2021-12-15 DIAGNOSIS — Z7982 Long term (current) use of aspirin: Secondary | ICD-10-CM | POA: Diagnosis not present

## 2021-12-15 DIAGNOSIS — S72011A Unspecified intracapsular fracture of right femur, initial encounter for closed fracture: Secondary | ICD-10-CM | POA: Diagnosis present

## 2021-12-15 DIAGNOSIS — Z833 Family history of diabetes mellitus: Secondary | ICD-10-CM | POA: Diagnosis not present

## 2021-12-15 DIAGNOSIS — S72001A Fracture of unspecified part of neck of right femur, initial encounter for closed fracture: Secondary | ICD-10-CM

## 2021-12-15 DIAGNOSIS — Z7984 Long term (current) use of oral hypoglycemic drugs: Secondary | ICD-10-CM | POA: Diagnosis not present

## 2021-12-15 DIAGNOSIS — Z818 Family history of other mental and behavioral disorders: Secondary | ICD-10-CM | POA: Diagnosis not present

## 2021-12-15 DIAGNOSIS — Z85528 Personal history of other malignant neoplasm of kidney: Secondary | ICD-10-CM | POA: Diagnosis not present

## 2021-12-15 DIAGNOSIS — Z8249 Family history of ischemic heart disease and other diseases of the circulatory system: Secondary | ICD-10-CM | POA: Diagnosis not present

## 2021-12-15 DIAGNOSIS — Z86711 Personal history of pulmonary embolism: Secondary | ICD-10-CM | POA: Diagnosis not present

## 2021-12-15 DIAGNOSIS — Z905 Acquired absence of kidney: Secondary | ICD-10-CM | POA: Diagnosis not present

## 2021-12-15 DIAGNOSIS — W010XXA Fall on same level from slipping, tripping and stumbling without subsequent striking against object, initial encounter: Secondary | ICD-10-CM | POA: Diagnosis present

## 2021-12-15 DIAGNOSIS — E1122 Type 2 diabetes mellitus with diabetic chronic kidney disease: Secondary | ICD-10-CM | POA: Diagnosis present

## 2021-12-15 DIAGNOSIS — R338 Other retention of urine: Secondary | ICD-10-CM

## 2021-12-15 DIAGNOSIS — B9620 Unspecified Escherichia coli [E. coli] as the cause of diseases classified elsewhere: Secondary | ICD-10-CM | POA: Diagnosis present

## 2021-12-15 LAB — HEMOGLOBIN A1C
Hgb A1c MFr Bld: 6.2 % — ABNORMAL HIGH (ref 4.8–5.6)
Mean Plasma Glucose: 131.24 mg/dL

## 2021-12-15 LAB — GLUCOSE, CAPILLARY
Glucose-Capillary: 168 mg/dL — ABNORMAL HIGH (ref 70–99)
Glucose-Capillary: 113 mg/dL — ABNORMAL HIGH (ref 70–99)
Glucose-Capillary: 119 mg/dL — ABNORMAL HIGH (ref 70–99)
Glucose-Capillary: 114 mg/dL — ABNORMAL HIGH (ref 70–99)

## 2021-12-15 LAB — TROPONIN I (HIGH SENSITIVITY): Troponin I (High Sensitivity): 29 ng/L — ABNORMAL HIGH (ref <18)

## 2021-12-15 MED ORDER — POLYETHYLENE GLYCOL 3350 17 G PO PACK
17.0000 g | PACK | Freq: Every day | ORAL | Status: DC
  Administered 2021-12-15 – 2021-12-19 (×4): 17 g via ORAL
  Filled 2021-12-15 (×4): qty 1

## 2021-12-15 MED ORDER — FENTANYL CITRATE PF 50 MCG/ML IJ SOSY
12.5000 ug | PREFILLED_SYRINGE | INTRAMUSCULAR | Status: DC | PRN
  Administered 2021-12-15: 25 ug via INTRAVENOUS
  Administered 2021-12-15: 12.5 ug via INTRAVENOUS
  Filled 2021-12-15 (×2): qty 1

## 2021-12-15 MED ORDER — HYDROMORPHONE HCL 1 MG/ML IJ SOLN
0.5000 mg | INTRAMUSCULAR | Status: DC | PRN
  Administered 2021-12-15 – 2021-12-17 (×5): 0.5 mg via INTRAVENOUS
  Filled 2021-12-15 (×7): qty 0.5

## 2021-12-15 MED ORDER — FLUTICASONE PROPIONATE 50 MCG/ACT NA SUSP
2.0000 | Freq: Every day | NASAL | Status: DC
  Administered 2021-12-15 – 2021-12-19 (×3): 2 via NASAL
  Filled 2021-12-15: qty 16

## 2021-12-15 MED ORDER — ACETAMINOPHEN 325 MG PO TABS: 650.0000 mg | ORAL_TABLET | Freq: Four times a day (QID) | ORAL | Status: DC | PRN

## 2021-12-15 MED ORDER — METHOCARBAMOL 500 MG PO TABS
500.0000 mg | ORAL_TABLET | Freq: Four times a day (QID) | ORAL | Status: DC | PRN
  Administered 2021-12-15: 500 mg via ORAL
  Filled 2021-12-15: qty 1

## 2021-12-15 MED ORDER — INSULIN ASPART 100 UNIT/ML IJ SOLN
0.0000 [IU] | INTRAMUSCULAR | Status: DC
  Administered 2021-12-15: 1 [IU] via SUBCUTANEOUS
  Administered 2021-12-16: 2 [IU] via SUBCUTANEOUS
  Administered 2021-12-16: 1 [IU] via SUBCUTANEOUS
  Administered 2021-12-16: 2 [IU] via SUBCUTANEOUS

## 2021-12-15 MED ORDER — ACETAMINOPHEN 500 MG PO TABS
1000.0000 mg | ORAL_TABLET | Freq: Three times a day (TID) | ORAL | Status: DC
  Administered 2021-12-15 – 2021-12-16 (×2): 1000 mg via ORAL
  Filled 2021-12-15 (×3): qty 2

## 2021-12-15 MED ORDER — ADULT MULTIVITAMIN W/MINERALS CH
1.0000 | ORAL_TABLET | Freq: Every day | ORAL | Status: DC
Start: 2021-12-15 — End: 2021-12-20
  Administered 2021-12-15 – 2021-12-19 (×5): 1 via ORAL
  Filled 2021-12-15 (×5): qty 1

## 2021-12-15 MED ORDER — AMLODIPINE BESYLATE 5 MG PO TABS
5.0000 mg | ORAL_TABLET | Freq: Every day | ORAL | Status: DC
  Administered 2021-12-15 – 2021-12-19 (×5): 5 mg via ORAL
  Filled 2021-12-15 (×5): qty 1

## 2021-12-15 MED ORDER — SODIUM CHLORIDE 0.9 % IV SOLN
1.0000 g | INTRAVENOUS | Status: DC
  Administered 2021-12-16 – 2021-12-17 (×2): 1 g via INTRAVENOUS
  Filled 2021-12-15 (×2): qty 10

## 2021-12-15 MED ORDER — LACTATED RINGERS IV SOLN: INTRAVENOUS | Status: AC

## 2021-12-15 MED ORDER — ENSURE ENLIVE PO LIQD
237.0000 mL | Freq: Two times a day (BID) | ORAL | Status: DC
  Administered 2021-12-15 – 2021-12-19 (×5): 237 mL via ORAL

## 2021-12-15 MED ORDER — HYDROMORPHONE HCL 1 MG/ML IJ SOLN
0.5000 mg | Freq: Once | INTRAMUSCULAR | Status: AC
  Administered 2021-12-15: 0.5 mg via INTRAVENOUS
  Filled 2021-12-15: qty 1

## 2021-12-15 MED ORDER — CEFTRIAXONE SODIUM 1 G IJ SOLR
1.0000 g | Freq: Once | INTRAMUSCULAR | Status: AC
  Administered 2021-12-15: 1 g via INTRAVENOUS
  Filled 2021-12-15: qty 10

## 2021-12-15 MED ORDER — CYCLOBENZAPRINE HCL 5 MG PO TABS
5.0000 mg | ORAL_TABLET | Freq: Three times a day (TID) | ORAL | Status: DC | PRN
  Administered 2021-12-15 – 2021-12-18 (×2): 5 mg via ORAL
  Filled 2021-12-15 (×2): qty 1

## 2021-12-15 NOTE — H&P (View-Only) (Signed)
ORTHOPAEDIC CONSULTATION  REQUESTING PHYSICIAN: Elodia Florence., *  PCP:  Sueanne Margarita, DO  Chief Complaint: Right hip pain, femoral neck fracture  HPI: Alexa [name] is a 88 y.o. female who presented to [facility] ED via EMS after a fall at home on 12/14/21. She was unloading the dishwasher when she sustained a ground level fall with immediate pain. Workup in the ER revealed impacted right femoral neck fracture. Orthopaedics were consulted for evaluation and management.  Today, on exam she is resting in bed. She is very pleasant. She tells me she lives alone at home and typically ambulates with a rollator. She is still driving and doing all daily activities independently. She has a son who lives in [place] named [place]. She takes aspirin 81 mg daily at baseline. She is unsure if she is a diabetic, but believes she was told this in the past.   I called her son, Alexa [name] [phone], to discuss her diagnosis and anticipated treatment plan. We discussed recovery as well, and he has concerns about her returning home. He tells me there have been discussions for years about need for some assistance, but she has not been interested in this.   Past Medical History:  Diagnosis Date   Angio-edema    Asthma    Chronic obstructive pulmonary disease ([place])    Diverticulosis    Diverticulosis of colon (without mention of hemorrhage)    Eczema    Esophageal reflux    History of colonic polyps 06/05/05   hyperplastic   Internal hemorrhoids    Irritable bowel syndrome    Ischemic colitis ([place])    Malignant neoplasm of kidney and other and unspecified urinary organs    renal carcinoma, left nephrectomy in 2008-per patient.   Other and unspecified hyperlipidemia    Personal history of venous thrombosis and embolism    PTE after nephrectomy   Spinal stenosis    Type II or unspecified type diabetes mellitus without mention of complication, not stated as uncontrolled 02/05/2012   pt states it was in  2007   Unspecified essential hypertension    Urticaria    Past Surgical History:  Procedure Laterality Date   ADENOIDECTOMY     BREAST BIOPSY     COLONOSCOPY W/ POLYPECTOMY  2000   COLONOSCOPY W/ POLYPECTOMY  07/2004   Hyperplastic polyps   DILATION AND CURETTAGE OF UTERUS  03/2005   Uterine Polyps   NEPHRECTOMY  2007   right,Dr Dahlstedt   SEPTOPLASTY     TONSILLECTOMY     Social History   Socioeconomic History   Marital status: Married    Spouse name: Not on file   Number of children: Not on file   Years of education: Not on file   Highest education level: Not on file  Occupational History   Not on file  Tobacco Use   Smoking status: Never   Smokeless tobacco: Never  Vaping Use   Vaping Use: Never used  Substance and Sexual Activity   Alcohol use: No   Drug use: No   Sexual activity: Not on file  Other Topics Concern   Not on file  Social History Narrative   Not on file   Social Determinants of Health   Financial Resource Strain: Not on file  Food Insecurity: Not on file  Transportation Needs: Not on file  Physical Activity: Not on file  Stress: Not on file  Social Connections: Not on file   Family History  Problem Relation Age of Onset   Stroke Mother        onset in 21s   Diabetes Mother    Cancer Mother        bladder; nephrectomy for calculi/also uterine , TAH & BSO   Aneurysm Mother        thoracic   Depression Mother    Stroke Brother    Heart failure Brother    Heart attack Other        maternal family history   Heart failure Maternal Grandfather    Lung cancer Father        smoked   Heart failure Sister    Depression Sister    Alzheimer's disease Sister    COPD Sister    Aneurysm Brother        cns   Depression Maternal Aunt        X2   Bipolar disorder Sister    Alcoholism Neg Hx    Allergies  Allergen Reactions   Lisinopril     Cough D/Ced by Dr Melvyn Novas   Codeine     nausea   Verapamil     Pain in feet   Prior to  Admission medications   Medication Sig Start Date End Date Taking? Authorizing Provider  amLODipine (NORVASC) 5 MG tablet Take 1 tablet (5 mg total) by mouth daily. 07/27/20   Copland, Gay Filler, MD  Ascorbic Acid (VITAMIN C) POWD Take 1 packet by mouth 2 (two) times daily as needed (cold symptoms).    [provider]  aspirin EC 81 MG tablet Take 162 mg by mouth daily.    [provider]  Benzalkonium Chloride (DIABETIC BASICS HEALTHY FOOT EX) Apply 1 application topically daily as needed (foot pain). OTC diabetic foot cream    [provider]  Blood Glucose Monitoring Suppl (TRUE METRIX METER) w/Device KIT Use as directed to monitor glucose up to 2 times daily. E11.9 dx code 10/04/20   Copland, Gay Filler, MD  busPIRone (BUSPAR) 7.5 MG tablet Take 2 tablets (15 mg total) by mouth 2 (two) times daily. May take one 4x a day if preferred 04/28/20   Copland, Gay Filler, MD  CALCIUM PO Take 1 tablet by mouth daily.    [provider]  cetirizine (ZYRTEC ALLERGY) 10 MG tablet Take 1/2 tablet to 1 tablet daily. 01/19/21   Garnet Sierras, DO  Cholecalciferol (VITAMIN D3) 1.25 MG (50000 UT) CAPS Take 1 weekly for 12 weeks 10/18/20   Copland, Gay Filler, MD  CRANBERRY PO Take 2 capsules by mouth 2 (two) times daily. 64m    [provider]  EPINEPHrine 0.3 mg/0.3 mL IJ SOAJ injection Inject 0.3 mg into the muscle as needed for anaphylaxis. 01/19/21   KGarnet Sierras DO  ESTRACE VAGINAL 0.1 MG/GM vaginal cream Apply locally every other night at bedtime 01/16/16   [provider]  fluticasone (FLONASE) 50 MCG/ACT nasal spray INSTILL 2 SPRAYS INTO EACH NOSTRIL ONCE DAILY 04/28/20   Copland, JGay Filler MD  gabapentin (NEURONTIN) 100 MG capsule Take 1 capsule (100 mg total) by mouth 3 (three) times daily. Take one capsule by mouth three times daily. 04/28/20   Copland, JGay Filler MD  glucose blood (TRUETEST TEST) test strip Use to test blood sugar ICD 10 E11 9 07/14/14    HHendricks Limes MD  HYDROcodone-acetaminophen (NORCO/VICODIN) 5-325 MG tablet Take 1 tablet by mouth 2 (two) times daily as needed. 11/06/21   [provider]  hydrOXYzine (ATARAX) 10 MG tablet Take 10 mg by mouth daily as needed. 11/13/21   [provider]  Multiple Vitamins-Minerals (EYE VITAMINS PO) Take by mouth. AREDS2    [provider]  sitaGLIPtin (JANUVIA) 50 MG tablet Take 1 tablet (50 mg total) by mouth daily. 10/19/20   Copland, Gay Filler, MD  triamcinolone (KENALOG) 0.025 % cream APPLY 1 APPLICATION TOPICALLY TWICE DAILY AS NEEDED FOR DRY SKIN ON BACK 04/28/20   Copland, Gay Filler, MD  TRUEplus Lancets 33G MISC Use as directed to check glucose up to 2 times daily. E11.9 dx code 10/04/20   Copland, Gay Filler, MD  Vitamin D, Ergocalciferol, (DRISDOL) 1.25 MG (50000 UNIT) CAPS capsule Take 50,000 Units by mouth once a week. 11/29/21   [provider]   DG Chest 1 View  Result Date: 12/14/2021 CLINICAL DATA:  Fall with chest and right hip pain. EXAM: DG HIP (WITH OR WITHOUT PELVIS) 2-3V RIGHT; CHEST  1 VIEW COMPARISON:  PA chest and rib films 05/31/2020, AP pelvis and bilateral hip films 10/22/2017. FINDINGS: Chest: The heart is slightly enlarged. There is chronic aortic tortuosity and atherosclerosis. Stable mediastinal configuration. No vascular congestion is seen.  The lungs are clear of infiltrates. The sulci are sharp. Mild chronic interstitial change both lateral bases. Osteopenia and slight lumbar levoscoliosis. No acute osseous abnormality is seen. AP pelvis and AP and ladder left hip views: There is mild osteopenia. Although a discrete cortical step-off is not seen, there is an interval shortened appearance to the right femoral neck concerning for an impacted transverse subcapital proximal right femoral fracture. Also possible this could have occurred since 10/22/2017 and healed. The proximal left femur is intact AP. No pelvic fracture or diastasis is seen.  There are mild features of degenerative arthrosis of the hips. There is advanced degenerative change of the visualized lower lumbar spine again noted. IMPRESSION: 1. No evidence of acute chest disease.  Mild cardiomegaly. 2. Interval new shortened appearance of the right femoral neck since March 2019. This is worrisome for a subcapital transverse impacted femoral neck fracture although no focal cortical step-off is visible. Consider CT to assess chronicity as this could be chronic. 3. Aortic atherosclerosis. Electronically Signed   By: Telford Nab M.D.   On: 12/14/2021 23:16   CT Head Wo Contrast  Result Date: 12/14/2021 CLINICAL DATA:  Head trauma, minor (Age >= 65y). EXAM: CT HEAD WITHOUT CONTRAST TECHNIQUE: Contiguous axial images were obtained from the base of the skull through the vertex without intravenous contrast. RADIATION DOSE REDUCTION: This exam was performed according to the departmental dose-optimization program which includes automated exposure control, adjustment of the mA and/or kV according to patient size and/or use of iterative reconstruction technique. COMPARISON:  06/06/2017 FINDINGS: Brain: Normal anatomic configuration. Parenchymal volume loss is commensurate with the patient's age. Mild periventricular white matter changes are present likely reflecting the sequela of small vessel ischemia. Remote lacunar infarcts within the left thalamus and right paramedian cerebellar infarct are again identified. No abnormal intra or extra-axial mass lesion or fluid collection. No abnormal mass effect or midline shift. No evidence of acute intracranial hemorrhage or infarct. Ventricular size is normal. Cerebellum unremarkable. Vascular: No asymmetric hyperdense vasculature at the skull base. Skull: Intact Sinuses/Orbits: Paranasal sinuses are clear. Ocular lenses have been removed. Orbits are otherwise unremarkable. Other: Mastoid air cells and middle ear cavities are clear. IMPRESSION: No acute  intracranial abnormality. Stable senescent change and remote infarcts as outlined above. Electronically Signed  By: Fidela Salisbury M.D.   On: 12/14/2021 23:21   CT Hip Right Wo Contrast  Result Date: 12/15/2021 CLINICAL DATA:  Hip trauma, fracture suspected, pain 10/10. EXAM: CT OF THE RIGHT HIP WITHOUT CONTRAST TECHNIQUE: Multidetector CT imaging of the right hip was performed according to the standard protocol. Multiplanar CT image reconstructions were also generated. RADIATION DOSE REDUCTION: This exam was performed according to the departmental dose-optimization program which includes automated exposure control, adjustment of the mA and/or kV according to patient size and/or use of iterative reconstruction technique. COMPARISON:  AP pelvis and right hip series yesterday, AP pelvis and bilateral hip series 10/22/2017. FINDINGS: Bones/Joint/Cartilage Bone mineralization is osteopenic. There is an acute, closed impacted and otherwise nondisplaced transverse oblique fracture of the proximal right humerus with small comminution fragments at the anterior and posterior fracture margins. There are cystic intermixed with sclerotic changes in the posterior subarticular femoral head consistent with osteonecrosis which has a chronic appearance with slight deformity of the overlying cortical surface. There is partial superior joint space loss at the hip with small acetabular and femoral head osteophytes consistent with degenerative arthrosis. There is mild spurring of the pubic symphysis. There are no further fractures in the scanned structures. There appears to be a small right hip joint hemarthrosis. Ligaments Suboptimally evaluated by CT. Muscles and Tendons There is normal muscle bulk. No intramuscular hematoma is seen. Area tendons are intact as far seen but not optimally well evaluated. Soft tissues Visualized portions of the right hemipelvis are unremarkable apart from moderate bladder dilatation. There is no  free fluid, free air or free hemorrhage. There is stranding in the subcutaneous plane in the right buttock inferiorly probably posttraumatic etiology. Additional stranding lateral to the proximal right femur. There is mild scattered calcific plaque in the right external iliac, common iliac and proximal superficial femoral arteries. IMPRESSION: 1. Acute transverse oblique subcapital proximal right femoral fracture, impacted and otherwise not displaced. 2. Osteopenia and degenerative change. 3. Sclerotic and cystic changes in posterior femoral head consistent with likely chronic osteonecrosis. 4. Suspected small right hip joint hemarthrosis. 5. Subcutaneous stranding likely posttraumatic. 6. Bladder dilatation not fully visualized. 7. Remaining findings discussed above. Electronically Signed   By: Telford Nab M.D.   On: 12/15/2021 02:05   DG HIP UNILAT WITH PELVIS 2-3 VIEWS RIGHT  Result Date: 12/14/2021 CLINICAL DATA:  Fall with chest and right hip pain. EXAM: DG HIP (WITH OR WITHOUT PELVIS) 2-3V RIGHT; CHEST  1 VIEW COMPARISON:  PA chest and rib films 05/31/2020, AP pelvis and bilateral hip films 10/22/2017. FINDINGS: Chest: The heart is slightly enlarged. There is chronic aortic tortuosity and atherosclerosis. Stable mediastinal configuration. No vascular congestion is seen.  The lungs are clear of infiltrates. The sulci are sharp. Mild chronic interstitial change both lateral bases. Osteopenia and slight lumbar levoscoliosis. No acute osseous abnormality is seen. AP pelvis and AP and ladder left hip views: There is mild osteopenia. Although a discrete cortical step-off is not seen, there is an interval shortened appearance to the right femoral neck concerning for an impacted transverse subcapital proximal right femoral fracture. Also possible this could have occurred since 10/22/2017 and healed. The proximal left femur is intact AP. No pelvic fracture or diastasis is seen. There are mild features of  degenerative arthrosis of the hips. There is advanced degenerative change of the visualized lower lumbar spine again noted. IMPRESSION: 1. No evidence of acute chest disease.  Mild cardiomegaly. 2. Interval new shortened appearance of the  right femoral neck since March 2019. This is worrisome for a subcapital transverse impacted femoral neck fracture although no focal cortical step-off is visible. Consider CT to assess chronicity as this could be chronic. 3. Aortic atherosclerosis. Electronically Signed   By: Telford Nab M.D.   On: 12/14/2021 23:16    Positive ROS: All other systems have been reviewed and were otherwise negative with the exception of those mentioned in the HPI and as above.  Physical Exam: General: Alert, no acute distress Cardiovascular: No pedal edema Respiratory: No cyanosis, no use of accessory musculature Skin: No lesions in the area of chief complaint Neurologic: Sensation intact distally Psychiatric: Patient is competent for consent with normal mood and affect Lymphatic: No axillary or cervical lymphadenopathy  MUSCULOSKELETAL:  Right Lower extremity: Skin intact without rashes or lesions. Deferred hip ROM secondary to known hip fracture. Sensation intact to the LE. Distal pulses intact.   Assessment: Impacted right femoral neck fracture Diabetes mellitus, historical a1c values 6.5-7.8 over the past several years per chart review. Will repeat.  Plan:  I discussed with Ms. Perryman her diagnosis and proposed treatment plan. Recommended treatment is surgical intervention with right total hip arthroplasty. She is very active for her age. Dr. Lyla Glassing is on call tomorrow and will address this then.   I called her son, Alexa [name]. He will plan to be at the hospital tomorrow to speak with surgeon. NPO after midnight.    Irving Copas, PA-C Cell [phone]   12/15/2021 10:45 AM

## 2021-12-15 NOTE — Assessment & Plan Note (Addendum)
Follow cultures -> e. Coli sensitive to cefazolin Plan for 7 days

## 2021-12-15 NOTE — H&P (Signed)
History and Physical    Brittnei Jagiello Ruddock AOZ:[id number] DOB: Jan 14, 1930 DOA: 12/14/2021  PCP: Sueanne Margarita, DO   Patient coming from: Home   Chief Complaint: Fall with right hip pain   HPI: Rosanna Bickle Coste is a pleasant 88 y.o. female with medical history significant for hypertension, type 2 diabetes mellitus, renal carcinoma status post left nephrectomy in 2008, and mild renal insufficiency, who presents to the emergency department with severe right hip pain after fall at home.  Patient reports that she was doing fairly well last night and loading her dishwasher when she turned, tripped, and fell onto her right hip.  She had severe pain immediately, tried to use her life alert button but it did not work, but she was within reach of her cell phone and called EMS.  She she had been experiencing some general malaise for the past few days following a tooth extraction on 12/10/2021, and was wondering if she might also have a UTI though she denies dysuria or flank pain.  She has not had any recent fever or chills.  She denies any chest pain, nausea, shortness of breath, or diaphoresis.  She is normally very active, lives alone, drives, uses a walker or cane at times, but is able to go up stairs.  ED Course: Upon arrival to the ED, patient is found to be afebrile and hypertensive.  EKG features sinus rhythm with first-degree AV nodal block.  Chest x-ray with mild cardiomegaly but no acute finding.  Head CT negative for acute intracranial abnormality.  CT of the right hip demonstrates acute transverse oblique subcapital right femur fracture.  Chemistry panel notable for BUN 29 and creatinine 0.99.  Troponin was slightly elevated.  Urinalysis notable for pyuria and positive nitrite.  Orthopedic surgery was consulted by the ED physician and the patient was treated with Rocephin, Zofran, morphine, and Dilaudid.  Review of Systems:  All other systems reviewed and apart from HPI, are negative.  Past Medical  History:  Diagnosis Date   Angio-edema    Asthma    Chronic obstructive pulmonary disease ([place])    Diverticulosis    Diverticulosis of colon (without mention of hemorrhage)    Eczema    Esophageal reflux    History of colonic polyps 06/05/05   hyperplastic   Internal hemorrhoids    Irritable bowel syndrome    Ischemic colitis ([place])    Malignant neoplasm of kidney and other and unspecified urinary organs    renal carcinoma, left nephrectomy in 2008-per patient.   Other and unspecified hyperlipidemia    Personal history of venous thrombosis and embolism    PTE after nephrectomy   Spinal stenosis    Type II or unspecified type diabetes mellitus without mention of complication, not stated as uncontrolled 02/05/2012   pt states it was in 2007   Unspecified essential hypertension    Urticaria     Past Surgical History:  Procedure Laterality Date   ADENOIDECTOMY     BREAST BIOPSY     COLONOSCOPY W/ POLYPECTOMY  2000   COLONOSCOPY W/ POLYPECTOMY  07/2004   Hyperplastic polyps   DILATION AND CURETTAGE OF UTERUS  03/2005   Uterine Polyps   NEPHRECTOMY  2007   right,Dr Dahlstedt   SEPTOPLASTY     TONSILLECTOMY      Social History:   reports that she has never smoked. She has never used smokeless tobacco. She reports that she does not drink alcohol and does not use drugs.  Allergies  Allergen Reactions   Lisinopril     Cough D/Ced by Dr Melvyn Novas   Codeine     nausea   Verapamil     Pain in feet    Family History  Problem Relation Age of Onset   Stroke Mother        onset in 60s   Diabetes Mother    Cancer Mother        bladder; nephrectomy for calculi/also uterine , TAH & BSO   Aneurysm Mother        thoracic   Depression Mother    Stroke Brother    Heart failure Brother    Heart attack Other        maternal family history   Heart failure Maternal Grandfather    Lung cancer Father        smoked   Heart failure Sister    Depression Sister    Alzheimer's disease  Sister    COPD Sister    Aneurysm Brother        cns   Depression Maternal Aunt        X2   Bipolar disorder Sister    Alcoholism Neg Hx      Prior to Admission medications   Medication Sig Start Date End Date Taking? Authorizing Provider  amLODipine (NORVASC) 5 MG tablet Take 1 tablet (5 mg total) by mouth daily. 07/27/20   Copland, Gay Filler, MD  Ascorbic Acid (VITAMIN C) POWD Take 1 packet by mouth 2 (two) times daily as needed (cold symptoms).    [provider]  aspirin EC 81 MG tablet Take 162 mg by mouth daily.    [provider]  Benzalkonium Chloride (DIABETIC BASICS HEALTHY FOOT EX) Apply 1 application topically daily as needed (foot pain). OTC diabetic foot cream    [provider]  Blood Glucose Monitoring Suppl (TRUE METRIX METER) w/Device KIT Use as directed to monitor glucose up to 2 times daily. E11.9 dx code 10/04/20   Copland, Gay Filler, MD  busPIRone (BUSPAR) 7.5 MG tablet Take 2 tablets (15 mg total) by mouth 2 (two) times daily. May take one 4x a day if preferred 04/28/20   Copland, Gay Filler, MD  CALCIUM PO Take 1 tablet by mouth daily.    [provider]  cetirizine (ZYRTEC ALLERGY) 10 MG tablet Take 1/2 tablet to 1 tablet daily. 01/19/21   Garnet Sierras, DO  Cholecalciferol (VITAMIN D3) 1.25 MG (50000 UT) CAPS Take 1 weekly for 12 weeks 10/18/20   Copland, Gay Filler, MD  CRANBERRY PO Take 2 capsules by mouth 2 (two) times daily. 41m    [provider]  EPINEPHrine 0.3 mg/0.3 mL IJ SOAJ injection Inject 0.3 mg into the muscle as needed for anaphylaxis. 01/19/21   KGarnet Sierras DO  ESTRACE VAGINAL 0.1 MG/GM vaginal cream Apply locally every other night at bedtime 01/16/16   [provider]  fluticasone (FLONASE) 50 MCG/ACT nasal spray INSTILL 2 SPRAYS INTO EACH NOSTRIL ONCE DAILY 04/28/20   Copland, JGay Filler MD  gabapentin (NEURONTIN) 100 MG capsule Take 1 capsule (100 mg total) by mouth 3 (three) times daily. Take one  capsule by mouth three times daily. 04/28/20   Copland, JGay Filler MD  glucose blood (TRUETEST TEST) test strip Use to test blood sugar ICD 10 E11 9 07/14/14   HHendricks Limes MD  Multiple Vitamins-Minerals (EYE VITAMINS PO) Take by mouth. AREDS2    [provider]  sitaGLIPtin (JANUVIA)  50 MG tablet Take 1 tablet (50 mg total) by mouth daily. 10/19/20   Copland, Gay Filler, MD  triamcinolone (KENALOG) 0.025 % cream APPLY 1 APPLICATION TOPICALLY TWICE DAILY AS NEEDED FOR DRY SKIN ON BACK 04/28/20   Copland, Gay Filler, MD  TRUEplus Lancets 33G MISC Use as directed to check glucose up to 2 times daily. E11.9 dx code 10/04/20   Darreld Mclean, MD    Physical Exam: Vitals:   12/15/21 0115 12/15/21 0301 12/15/21 0412 12/15/21 0445  BP: (!) 151/104 (!) 159/95 (!) 160/96 (!) 159/88  Pulse: (!) 101 94 92 90  Resp: 16 17 (!) 21 18  Temp:   97.8 F (36.6 C) 98.7 F (37.1 C)  TempSrc:   Oral Oral  SpO2: 95% 96% 99% 98%  Weight:      Height:        Constitutional: NAD, calm  Eyes: PERTLA, lids and conjunctivae normal ENMT: Mucous membranes are moist. Posterior pharynx clear of any exudate or lesions.   Neck: supple, no masses  Respiratory: no wheezing, no crackles. No accessory muscle use.  Cardiovascular: S1 & S2 heard, regular rate and rhythm. No extremity edema.   Abdomen: No distension, no tenderness, soft. Bowel sounds active.  Musculoskeletal: no clubbing / cyanosis. Tender right hip, neurovascularly intact.   Skin: no significant rashes, lesions, ulcers. Warm, dry, well-perfused. Neurologic: CN 2-12 grossly intact. Moving all extremities. Alert and oriented.  Psychiatric: Pleasant. Cooperative.    Labs and Imaging on Admission: I have personally reviewed following labs and imaging studies  CBC: Recent Labs  Lab 12/14/21 2209  WBC 9.9  NEUTROABS 6.9  HGB 14.8  HCT 42.5  MCV 88.7  PLT 127   Basic Metabolic Panel: Recent Labs  Lab 12/14/21 2314  NA 142  K 3.7   CL 110  CO2 26  GLUCOSE 146*  BUN 24*  CREATININE 0.99  CALCIUM 8.6*   GFR: Estimated Creatinine Clearance: 28.5 mL/min (by C-G formula based on SCr of 0.99 mg/dL). Liver Function Tests: No results for input(s): AST, ALT, ALKPHOS, BILITOT, PROT, ALBUMIN in the last 168 hours. No results for input(s): LIPASE, AMYLASE in the last 168 hours. No results for input(s): AMMONIA in the last 168 hours. Coagulation Profile: No results for input(s): INR, PROTIME in the last 168 hours. Cardiac Enzymes: No results for input(s): CKTOTAL, CKMB, CKMBINDEX, TROPONINI in the last 168 hours. BNP (last 3 results) No results for input(s): PROBNP in the last 8760 hours. HbA1C: No results for input(s): HGBA1C in the last 72 hours. CBG: No results for input(s): GLUCAP in the last 168 hours. Lipid Profile: No results for input(s): CHOL, HDL, LDLCALC, TRIG, CHOLHDL, LDLDIRECT in the last 72 hours. Thyroid Function Tests: No results for input(s): TSH, T4TOTAL, FREET4, T3FREE, THYROIDAB in the last 72 hours. Anemia Panel: No results for input(s): VITAMINB12, FOLATE, FERRITIN, TIBC, IRON, RETICCTPCT in the last 72 hours. Urine analysis:    Component Value Date/Time   COLORURINE YELLOW 12/14/2021 2251   APPEARANCEUR HAZY (A) 12/14/2021 2251   LABSPEC 1.013 12/14/2021 2251   PHURINE 5.0 12/14/2021 2251   GLUCOSEU NEGATIVE 12/14/2021 2251   HGBUR NEGATIVE 12/14/2021 2251   HGBUR negative 08/02/2009 1512   BILIRUBINUR NEGATIVE 12/14/2021 2251   BILIRUBINUR negative 08/25/2019 1154   BILIRUBINUR Negative 12/16/2017 1115   KETONESUR NEGATIVE 12/14/2021 2251   PROTEINUR NEGATIVE 12/14/2021 2251   UROBILINOGEN 0.2 08/25/2019 1154   UROBILINOGEN 0.2 12/12/2014 1623   NITRITE POSITIVE (A) 12/14/2021 2251  LEUKOCYTESUR MODERATE (A) 12/14/2021 2251   Sepsis Labs: @LABRCNTIP (procalcitonin:4,lacticidven:4) )No results found for this or any previous visit (from the past 240 hour(s)).   Radiological Exams  on Admission: DG Chest 1 View  Result Date: 12/14/2021 CLINICAL DATA:  Fall with chest and right hip pain. EXAM: DG HIP (WITH OR WITHOUT PELVIS) 2-3V RIGHT; CHEST  1 VIEW COMPARISON:  PA chest and rib films 05/31/2020, AP pelvis and bilateral hip films 10/22/2017. FINDINGS: Chest: The heart is slightly enlarged. There is chronic aortic tortuosity and atherosclerosis. Stable mediastinal configuration. No vascular congestion is seen.  The lungs are clear of infiltrates. The sulci are sharp. Mild chronic interstitial change both lateral bases. Osteopenia and slight lumbar levoscoliosis. No acute osseous abnormality is seen. AP pelvis and AP and ladder left hip views: There is mild osteopenia. Although a discrete cortical step-off is not seen, there is an interval shortened appearance to the right femoral neck concerning for an impacted transverse subcapital proximal right femoral fracture. Also possible this could have occurred since 10/22/2017 and healed. The proximal left femur is intact AP. No pelvic fracture or diastasis is seen. There are mild features of degenerative arthrosis of the hips. There is advanced degenerative change of the visualized lower lumbar spine again noted. IMPRESSION: 1. No evidence of acute chest disease.  Mild cardiomegaly. 2. Interval new shortened appearance of the right femoral neck since March 2019. This is worrisome for a subcapital transverse impacted femoral neck fracture although no focal cortical step-off is visible. Consider CT to assess chronicity as this could be chronic. 3. Aortic atherosclerosis. Electronically Signed   By: Telford Nab M.D.   On: 12/14/2021 23:16   CT Head Wo Contrast  Result Date: 12/14/2021 CLINICAL DATA:  Head trauma, minor (Age >= 65y). EXAM: CT HEAD WITHOUT CONTRAST TECHNIQUE: Contiguous axial images were obtained from the base of the skull through the vertex without intravenous contrast. RADIATION DOSE REDUCTION: This exam was performed  according to the departmental dose-optimization program which includes automated exposure control, adjustment of the mA and/or kV according to patient size and/or use of iterative reconstruction technique. COMPARISON:  06/06/2017 FINDINGS: Brain: Normal anatomic configuration. Parenchymal volume loss is commensurate with the patient's age. Mild periventricular white matter changes are present likely reflecting the sequela of small vessel ischemia. Remote lacunar infarcts within the left thalamus and right paramedian cerebellar infarct are again identified. No abnormal intra or extra-axial mass lesion or fluid collection. No abnormal mass effect or midline shift. No evidence of acute intracranial hemorrhage or infarct. Ventricular size is normal. Cerebellum unremarkable. Vascular: No asymmetric hyperdense vasculature at the skull base. Skull: Intact Sinuses/Orbits: Paranasal sinuses are clear. Ocular lenses have been removed. Orbits are otherwise unremarkable. Other: Mastoid air cells and middle ear cavities are clear. IMPRESSION: No acute intracranial abnormality. Stable senescent change and remote infarcts as outlined above. Electronically Signed   By: Fidela Salisbury M.D.   On: 12/14/2021 23:21   CT Hip Right Wo Contrast  Result Date: 12/15/2021 CLINICAL DATA:  Hip trauma, fracture suspected, pain 10/10. EXAM: CT OF THE RIGHT HIP WITHOUT CONTRAST TECHNIQUE: Multidetector CT imaging of the right hip was performed according to the standard protocol. Multiplanar CT image reconstructions were also generated. RADIATION DOSE REDUCTION: This exam was performed according to the departmental dose-optimization program which includes automated exposure control, adjustment of the mA and/or kV according to patient size and/or use of iterative reconstruction technique. COMPARISON:  AP pelvis and right hip series yesterday, AP pelvis and  bilateral hip series 10/22/2017. FINDINGS: Bones/Joint/Cartilage Bone mineralization is  osteopenic. There is an acute, closed impacted and otherwise nondisplaced transverse oblique fracture of the proximal right humerus with small comminution fragments at the anterior and posterior fracture margins. There are cystic intermixed with sclerotic changes in the posterior subarticular femoral head consistent with osteonecrosis which has a chronic appearance with slight deformity of the overlying cortical surface. There is partial superior joint space loss at the hip with small acetabular and femoral head osteophytes consistent with degenerative arthrosis. There is mild spurring of the pubic symphysis. There are no further fractures in the scanned structures. There appears to be a small right hip joint hemarthrosis. Ligaments Suboptimally evaluated by CT. Muscles and Tendons There is normal muscle bulk. No intramuscular hematoma is seen. Area tendons are intact as far seen but not optimally well evaluated. Soft tissues Visualized portions of the right hemipelvis are unremarkable apart from moderate bladder dilatation. There is no free fluid, free air or free hemorrhage. There is stranding in the subcutaneous plane in the right buttock inferiorly probably posttraumatic etiology. Additional stranding lateral to the proximal right femur. There is mild scattered calcific plaque in the right external iliac, common iliac and proximal superficial femoral arteries. IMPRESSION: 1. Acute transverse oblique subcapital proximal right femoral fracture, impacted and otherwise not displaced. 2. Osteopenia and degenerative change. 3. Sclerotic and cystic changes in posterior femoral head consistent with likely chronic osteonecrosis. 4. Suspected small right hip joint hemarthrosis. 5. Subcutaneous stranding likely posttraumatic. 6. Bladder dilatation not fully visualized. 7. Remaining findings discussed above. Electronically Signed   By: Telford Nab M.D.   On: 12/15/2021 02:05   DG HIP UNILAT WITH PELVIS 2-3 VIEWS  RIGHT  Result Date: 12/14/2021 CLINICAL DATA:  Fall with chest and right hip pain. EXAM: DG HIP (WITH OR WITHOUT PELVIS) 2-3V RIGHT; CHEST  1 VIEW COMPARISON:  PA chest and rib films 05/31/2020, AP pelvis and bilateral hip films 10/22/2017. FINDINGS: Chest: The heart is slightly enlarged. There is chronic aortic tortuosity and atherosclerosis. Stable mediastinal configuration. No vascular congestion is seen.  The lungs are clear of infiltrates. The sulci are sharp. Mild chronic interstitial change both lateral bases. Osteopenia and slight lumbar levoscoliosis. No acute osseous abnormality is seen. AP pelvis and AP and ladder left hip views: There is mild osteopenia. Although a discrete cortical step-off is not seen, there is an interval shortened appearance to the right femoral neck concerning for an impacted transverse subcapital proximal right femoral fracture. Also possible this could have occurred since 10/22/2017 and healed. The proximal left femur is intact AP. No pelvic fracture or diastasis is seen. There are mild features of degenerative arthrosis of the hips. There is advanced degenerative change of the visualized lower lumbar spine again noted. IMPRESSION: 1. No evidence of acute chest disease.  Mild cardiomegaly. 2. Interval new shortened appearance of the right femoral neck since March 2019. This is worrisome for a subcapital transverse impacted femoral neck fracture although no focal cortical step-off is visible. Consider CT to assess chronicity as this could be chronic. 3. Aortic atherosclerosis. Electronically Signed   By: Telford Nab M.D.   On: 12/14/2021 23:16    EKG: Independently reviewed. Sinus rhythm, 1st degree AV block.   Assessment/Plan   1. Right hip fracture  - Presents with severe right hip pain after a trip and fall at home and is found to have acute subcapital femur fracture  - Appreciate orthopedic surgery consulting  - Based on  the available data, Mrs. Pendell presents an  estimated 1% risk of perioperative MI or cardiac arrest  - Hold ASA, keep NPO, continue pain-control and supportive care    2. Type II DM  - A1c was 6.8% in 2021  - Check CBGs and use low-intensity SSI for now    3. Hypertension  - BP elevated on admission in setting of pain  - Continue pain-control, Norvasc    4. CKD IIIa  - SCr is 0.99 on admission  - Renally-dose medications, monitor   5. Elevated troponin  - Troponin minimally elevated in ED  - She is active, able to go up stairs, and never experiences angina - Low suspicion for ACS, does not need additional workup   6. UTI  - Not septic on admission  - Culture urine and continue Rocephin    DVT prophylaxis: SCDs  Code Status: DNR, discussed with patient on admission who says she has this documented and her son is aware  Level of Care: Level of care: Med-Surg Family Communication: son updated from ED  Disposition Plan:  Patient is from: Home  Anticipated d/c is to: TBD, likely SNF  Anticipated d/c date is: 12/18/21  Patient currently: pending ortho consult and plan  Consults called: ortho  Admission status: Inpatient     Vianne Bulls, MD [facility]  12/15/2021, 6:05 AM

## 2021-12-15 NOTE — Progress Notes (Signed)
Notified Dr. Florene Glen, MD that EKG that was just taken is Normal Sinus Rhythm. EKG results put in the chart.

## 2021-12-15 NOTE — Plan of Care (Signed)
  Problem: Education: Goal: Knowledge of General Education information will improve Description Including pain rating scale, medication(s)/side effects and non-pharmacologic comfort measures Outcome: Progressing   Problem: Health Behavior/Discharge Planning: Goal: Ability to manage health-related needs will improve Outcome: Progressing   

## 2021-12-15 NOTE — Progress Notes (Signed)
Initial Nutrition Assessment  INTERVENTION:   -Ensure Plus High Protein po BID, each supplement provides 350 kcal and 20 grams of protein.   -Multivitamin with minerals daily  NUTRITION DIAGNOSIS:   Increased nutrient needs related to hip fracture as evidenced by estimated needs.  GOAL:   Patient will meet greater than or equal to 90% of their needs  MONITOR:   Labs, Weight trends, I & O's, PO intake, Supplement acceptance  REASON FOR ASSESSMENT:   Consult Hip fracture protocol  ASSESSMENT:   88 y.o. female with medical history significant for hypertension, type 2 diabetes mellitus, renal carcinoma status post left nephrectomy in 2008, and mild renal insufficiency, who presents to the emergency department with severe right hip pain after fall at home. Admitted for right hip fracture.  Patient with right hip fracture, awaiting surgery, scheduled for tomorrow 5/21.  Pt on a solid diet today, no PO documented.  Per home medications, pt takes Vitamin D supplements and MVI at home.  Will add Ensure supplements with daily MVI to aid in post-op healing.  Per weight records, pt has lost 7 lbs since 4/25 (5% x 1 month, significant for time frame).   Medications: Lactated ringers  Labs reviewed:  CBGs: 113-168  NUTRITION - FOCUSED PHYSICAL EXAM:  Unable to complete, RD remote  Diet Order:   Diet Order             Diet Carb Modified Fluid consistency: Thin; Room service appropriate? Yes  Diet effective now                   EDUCATION NEEDS:   No education needs have been identified at this time  Skin:  Skin Assessment: Reviewed RN Assessment  Last BM:  5/19  Height:   Ht Readings from Last 1 Encounters:  12/14/21 '5\' 1"'$  (1.549 m)    Weight:   Wt Readings from Last 1 Encounters:  12/14/21 53.5 kg    BMI:  Body mass index is 22.3 kg/m.  Estimated Nutritional Needs:   Kcal:  1450-1650  Protein:  65-80g  Fluid:  1.7L/day  Clayton Bibles, MS,  RD, LDN Inpatient Clinical Dietitian Contact information available via Amion

## 2021-12-15 NOTE — Hospital Course (Addendum)
Alexa [name] is Alexa [name] pleasant 88 y.o. female with medical history significant for hypertension, type 2 diabetes mellitus, renal carcinoma status post left nephrectomy in 2008, and mild renal insufficiency, who presents to the emergency department with severe right hip pain after fall at home.  CT of the right hip demonstrates acute transverse oblique subcapital right femur fracture.  She's now s/p right total hip arthroplasty.   Awaiting SNF.  See below for additional details

## 2021-12-15 NOTE — Assessment & Plan Note (Addendum)
a1c 6.2 SSI

## 2021-12-15 NOTE — Progress Notes (Signed)
PROGRESS NOTE    Alexa [name]  QPY:[id number] DOB: 1929/12/22 DOA: 12/14/2021 PCP: Sueanne Margarita, DO  Chief Complaint  Patient presents with   Fall    Brief Narrative:  Alexa [name] is Alexa [name] pleasant 88 y.o. female with medical history significant for hypertension, type 2 diabetes mellitus, renal carcinoma status post left nephrectomy in 2008, and mild renal insufficiency, who presents to the emergency department with severe right hip pain after fall at home.  Patient reports that she was doing fairly well last night and loading her dishwasher when she turned, tripped, and fell onto her right hip.  She had severe pain immediately, tried to use her life alert button but it did not work, but she was within reach of her cell phone and called EMS.  She she had been experiencing some general malaise for the past few days following Alexa [name] tooth extraction on 12/10/2021, and was wondering if she might also have Alexa [name] UTI though she denies dysuria or flank pain.  She has not had any recent fever or chills.  She denies any chest pain, nausea, shortness of breath, or diaphoresis.  She is normally very active, lives alone, drives, uses [name] walker or cane at times, but is able to go up stairs.   ED Course: Upon arrival to the ED, patient is found to be afebrile and hypertensive.  EKG features sinus rhythm with first-degree AV nodal block.  Chest x-ray with mild cardiomegaly but no acute finding.  Head CT negative for acute intracranial abnormality.  CT of the right hip demonstrates acute transverse oblique subcapital right femur fracture.  Chemistry panel notable for BUN 29 and creatinine 0.99.  Troponin was slightly elevated.  Urinalysis notable for pyuria and positive nitrite.  Orthopedic surgery was consulted by the ED physician and the patient was treated with Rocephin, Zofran, morphine, and Dilaudid.    Assessment & Plan:   Principal Problem:   Closed right hip fracture (HCC) Active Problems:   Controlled type 2  diabetes mellitus without complication, without long-term current use of insulin (HCC)   Elevated troponin   Essential hypertension   Stage 3a chronic kidney disease (CKD) (HCC)   Acute cystitis without hematuria   Assessment and Plan: * Closed right hip fracture (HCC) CT with acute transverse oblique subcapital proximal R femoral fx, impacted and otherwise not displaced - sclerotic and cystic changes in posterior femoral head c/w chronic osteonecrosis, small R hip joint hemarthrossis, postraumatic subcutaneous stranding Surgery 5/21 She notes hx stroke? (I don't see this in hx?).  Also with T2DM.   RCRI 2 - she got around before with walker/rollator.  She did have mildly elevated troponin, but no CP and flat.  No plans for additional workup at this time.   Elevated troponin Asymptomatic Mildly elevated and flat EKG with q's in III and aVF No additional w/u unless she has sx    Controlled type 2 diabetes mellitus without complication, without long-term current use of insulin (HCC) a1c 6.2  Essential hypertension amlodipine  Stage 3a chronic kidney disease (CKD) (HCC) noted  Acute cystitis without hematuria Follow cultures Continue treatment for now          DVT prophylaxis: SCD Code Status: DNR Family Communication: none Disposition:   Status is: Inpatient Remains inpatient appropriate because: need for orthopedics   Consultants:  ortho  Procedures:  none  Antimicrobials:  Anti-infectives (From admission, onward)    Start     Dose/Rate Route Frequency Ordered Stop  12/16/21 0100  cefTRIAXone (ROCEPHIN) 1 g in sodium chloride 0.9 % 100 mL IVPB        1 g 200 mL/hr over 30 Minutes Intravenous Every 24 hours 12/15/21 0617     12/15/21 0015  cefTRIAXone (ROCEPHIN) 1 g in sodium chloride 0.9 % 100 mL IVPB        1 g 200 mL/hr over 30 Minutes Intravenous  Once 12/15/21 0002 12/15/21 0124       Subjective: No new complaints  Objective: Vitals:    12/15/21 0634 12/15/21 0635 12/15/21 1023 12/15/21 1431  BP: 140/89  (!) 146/91 (!) 147/84  Pulse: 88 87 88 91  Resp: '16  18 18  '$ Temp: 98.6 F (37 C)  98.3 F (36.8 C) 98.3 F (36.8 C)  TempSrc: Oral  Oral Oral  SpO2: 98% 97% 97% 96%  Weight:      Height:        Intake/Output Summary (Last 24 hours) at 12/15/2021 1505 Last data filed at 12/15/2021 1000 Gross per 24 hour  Intake 314.69 ml  Output 100 ml  Net 214.69 ml   Filed Weights   12/14/21 2155  Weight: 53.5 kg    Examination:  General exam: Appears calm and comfortable  Respiratory system: unlabored Cardiovascular system: RRR Gastrointestinal system: Abdomen is nondistended, soft and nontender. Central nervous system: Alert and oriented. No focal neurological deficits. Extremities: R hip mild swelling    Data Reviewed: I have personally reviewed following labs and imaging studies  CBC: Recent Labs  Lab 12/14/21 2209  WBC 9.9  NEUTROABS 6.9  HGB 14.8  HCT 42.5  MCV 88.7  PLT 528    Basic Metabolic Panel: Recent Labs  Lab 12/14/21 2314  NA 142  K 3.7  CL 110  CO2 26  GLUCOSE 146*  BUN 24*  CREATININE 0.99  CALCIUM 8.6*    GFR: Estimated Creatinine Clearance: 28.5 mL/min (by C-G formula based on SCr of 0.99 mg/dL).  Liver Function Tests: No results for input(s): AST, ALT, ALKPHOS, BILITOT, PROT, ALBUMIN in the last 168 hours.  CBG: Recent Labs  Lab 12/15/21 0800 12/15/21 1140  GLUCAP 168* 113*     No results found for this or any previous visit (from the past 240 hour(s)).       Radiology Studies: DG Chest 1 View  Result Date: 12/14/2021 CLINICAL DATA:  Fall with chest and right hip pain. EXAM: DG HIP (WITH OR WITHOUT PELVIS) 2-3V RIGHT; CHEST  1 VIEW COMPARISON:  PA chest and rib films 05/31/2020, AP pelvis and bilateral hip films 10/22/2017. FINDINGS: Chest: The heart is slightly enlarged. There is chronic aortic tortuosity and atherosclerosis. Stable mediastinal  configuration. No vascular congestion is seen.  The lungs are clear of infiltrates. The sulci are sharp. Mild chronic interstitial change both lateral bases. Osteopenia and slight lumbar levoscoliosis. No acute osseous abnormality is seen. AP pelvis and AP and ladder left hip views: There is mild osteopenia. Although Clorene Nerio discrete cortical step-off is not seen, there is an interval shortened appearance to the right femoral neck concerning for an impacted transverse subcapital proximal right femoral fracture. Also possible this could have occurred since 10/22/2017 and healed. The proximal left femur is intact AP. No pelvic fracture or diastasis is seen. There are mild features of degenerative arthrosis of the hips. There is advanced degenerative change of the visualized lower lumbar spine again noted. IMPRESSION: 1. No evidence of acute chest disease.  Mild cardiomegaly. 2. Interval new shortened appearance  of the right femoral neck since March 2019. This is worrisome for Yobana Culliton subcapital transverse impacted femoral neck fracture although no focal cortical step-off is visible. Consider CT to assess chronicity as this could be chronic. 3. Aortic atherosclerosis. Electronically Signed   By: Telford Nab M.D.   On: 12/14/2021 23:16   CT Head Wo Contrast  Result Date: 12/14/2021 CLINICAL DATA:  Head trauma, minor (Age >= 65y). EXAM: CT HEAD WITHOUT CONTRAST TECHNIQUE: Contiguous axial images were obtained from the base of the skull through the vertex without intravenous contrast. RADIATION DOSE REDUCTION: This exam was performed according to the departmental dose-optimization program which includes automated exposure control, adjustment of the mA and/or kV according to patient size and/or use of iterative reconstruction technique. COMPARISON:  06/06/2017 FINDINGS: Brain: Normal anatomic configuration. Parenchymal volume loss is commensurate with the patient's age. Mild periventricular white matter changes are present  likely reflecting the sequela of small vessel ischemia. Remote lacunar infarcts within the left thalamus and right paramedian cerebellar infarct are again identified. No abnormal intra or extra-axial mass lesion or fluid collection. No abnormal mass effect or midline shift. No evidence of acute intracranial hemorrhage or infarct. Ventricular size is normal. Cerebellum unremarkable. Vascular: No asymmetric hyperdense vasculature at the skull base. Skull: Intact Sinuses/Orbits: Paranasal sinuses are clear. Ocular lenses have been removed. Orbits are otherwise unremarkable. Other: Mastoid air cells and middle ear cavities are clear. IMPRESSION: No acute intracranial abnormality. Stable senescent change and remote infarcts as outlined above. Electronically Signed   By: Fidela Salisbury M.D.   On: 12/14/2021 23:21   CT Hip Right Wo Contrast  Result Date: 12/15/2021 CLINICAL DATA:  Hip trauma, fracture suspected, pain 10/10. EXAM: CT OF THE RIGHT HIP WITHOUT CONTRAST TECHNIQUE: Multidetector CT imaging of the right hip was performed according to the standard protocol. Multiplanar CT image reconstructions were also generated. RADIATION DOSE REDUCTION: This exam was performed according to the departmental dose-optimization program which includes automated exposure control, adjustment of the mA and/or kV according to patient size and/or use of iterative reconstruction technique. COMPARISON:  AP pelvis and right hip series yesterday, AP pelvis and bilateral hip series 10/22/2017. FINDINGS: Bones/Joint/Cartilage Bone mineralization is osteopenic. There is an acute, closed impacted and otherwise nondisplaced transverse oblique fracture of the proximal right humerus with small comminution fragments at the anterior and posterior fracture margins. There are cystic intermixed with sclerotic changes in the posterior subarticular femoral head consistent with osteonecrosis which has Airen Dales chronic appearance with slight deformity of the  overlying cortical surface. There is partial superior joint space loss at the hip with small acetabular and femoral head osteophytes consistent with degenerative arthrosis. There is mild spurring of the pubic symphysis. There are no further fractures in the scanned structures. There appears to be Jayley Hustead small right hip joint hemarthrosis. Ligaments Suboptimally evaluated by CT. Muscles and Tendons There is normal muscle bulk. No intramuscular hematoma is seen. Area tendons are intact as far seen but not optimally well evaluated. Soft tissues Visualized portions of the right hemipelvis are unremarkable apart from moderate bladder dilatation. There is no free fluid, free air or free hemorrhage. There is stranding in the subcutaneous plane in the right buttock inferiorly probably posttraumatic etiology. Additional stranding lateral to the proximal right femur. There is mild scattered calcific plaque in the right external iliac, common iliac and proximal superficial femoral arteries. IMPRESSION: 1. Acute transverse oblique subcapital proximal right femoral fracture, impacted and otherwise not displaced. 2. Osteopenia and degenerative change. 3.  Sclerotic and cystic changes in posterior femoral head consistent with likely chronic osteonecrosis. 4. Suspected small right hip joint hemarthrosis. 5. Subcutaneous stranding likely posttraumatic. 6. Bladder dilatation not fully visualized. 7. Remaining findings discussed above. Electronically Signed   By: Telford Nab M.D.   On: 12/15/2021 02:05   DG HIP UNILAT WITH PELVIS 2-3 VIEWS RIGHT  Result Date: 12/14/2021 CLINICAL DATA:  Fall with chest and right hip pain. EXAM: DG HIP (WITH OR WITHOUT PELVIS) 2-3V RIGHT; CHEST  1 VIEW COMPARISON:  PA chest and rib films 05/31/2020, AP pelvis and bilateral hip films 10/22/2017. FINDINGS: Chest: The heart is slightly enlarged. There is chronic aortic tortuosity and atherosclerosis. Stable mediastinal configuration. No vascular  congestion is seen.  The lungs are clear of infiltrates. The sulci are sharp. Mild chronic interstitial change both lateral bases. Osteopenia and slight lumbar levoscoliosis. No acute osseous abnormality is seen. AP pelvis and AP and ladder left hip views: There is mild osteopenia. Although Rika Daughdrill discrete cortical step-off is not seen, there is an interval shortened appearance to the right femoral neck concerning for an impacted transverse subcapital proximal right femoral fracture. Also possible this could have occurred since 10/22/2017 and healed. The proximal left femur is intact AP. No pelvic fracture or diastasis is seen. There are mild features of degenerative arthrosis of the hips. There is advanced degenerative change of the visualized lower lumbar spine again noted. IMPRESSION: 1. No evidence of acute chest disease.  Mild cardiomegaly. 2. Interval new shortened appearance of the right femoral neck since March 2019. This is worrisome for Jedaiah Rathbun subcapital transverse impacted femoral neck fracture although no focal cortical step-off is visible. Consider CT to assess chronicity as this could be chronic. 3. Aortic atherosclerosis. Electronically Signed   By: Telford Nab M.D.   On: 12/14/2021 23:16        Scheduled Meds:  acetaminophen  1,000 mg Oral Q8H   amLODipine  5 mg Oral Daily   feeding supplement  237 mL Oral BID BM   fluticasone  2 spray Each Nare Daily   insulin aspart  0-6 Units Subcutaneous Q4H   multivitamin with minerals  1 tablet Oral Daily   polyethylene glycol  17 g Oral Daily   Continuous Infusions:  [START ON 12/16/2021] cefTRIAXone (ROCEPHIN)  IV     lactated ringers 75 mL/hr at 12/15/21 0705     LOS: 0 days    Time spent: over 30 min    Fayrene Helper, MD [facility]   To contact the attending provider between 7A-7P or the covering provider during after hours 7P-7A, please log into the web site [email or web address] and access using universal [place] password for  that web site. If you do not have the password, please call the hospital operator.  12/15/2021, 3:05 PM

## 2021-12-15 NOTE — Assessment & Plan Note (Signed)
Asymptomatic Mildly elevated and flat EKG with q's in III and aVF No additional w/u unless she has sx

## 2021-12-15 NOTE — Assessment & Plan Note (Signed)
amlodipine 

## 2021-12-15 NOTE — Assessment & Plan Note (Signed)
noted 

## 2021-12-15 NOTE — Anesthesia Preprocedure Evaluation (Addendum)
Anesthesia Evaluation  Patient identified by MRN, date of birth, ID band Patient awake    Reviewed: Allergy & Precautions, NPO status , Patient's Chart, lab work & pertinent test results  Airway Mallampati: II  TM Distance: >3 FB Neck ROM: Full    Dental  (+) Dental Advisory Given   Pulmonary asthma , COPD,  COPD inhaler,    breath sounds clear to auscultation       Cardiovascular hypertension, Pt. on medications (-) angina Rhythm:Regular Rate:Normal     Neuro/Psych  Neuromuscular disease    GI/Hepatic Neg liver ROS, GERD  ,  Endo/Other  diabetes, Type 2, Oral Hypoglycemic Agents  Renal/GU Renal disease (s/p L neprectomy 2/2 renal ca)     Musculoskeletal   Abdominal   Peds  Hematology negative hematology ROS (+)   Anesthesia Other Findings   Reproductive/Obstetrics                            Lab Results  Component Value Date   WBC 9.9 12/14/2021   HGB 14.8 12/14/2021   HCT 42.5 12/14/2021   MCV 88.7 12/14/2021   PLT 225 12/14/2021   Lab Results  Component Value Date   CREATININE 0.99 12/14/2021   BUN 24 (H) 12/14/2021   NA 142 12/14/2021   K 3.7 12/14/2021   CL 110 12/14/2021   CO2 26 12/14/2021    Anesthesia Physical Anesthesia Plan  ASA: 3  Anesthesia Plan: General   Post-op Pain Management: Tylenol PO (pre-op)* and Minimal or no pain anticipated   Induction: Intravenous  PONV Risk Score and Plan: 3 and Dexamethasone, Ondansetron and Treatment may vary due to age or medical condition  Airway Management Planned: Oral ETT  Additional Equipment: None  Intra-op Plan:   Post-operative Plan: Extubation in OR  Informed Consent: I have reviewed the patients History and Physical, chart, labs and discussed the procedure including the risks, benefits and alternatives for the proposed anesthesia with the patient or authorized representative who has indicated his/her  understanding and acceptance.   Patient has DNR.  Discussed DNR with patient and Suspend DNR.   Dental advisory given  Plan Discussed with: CRNA  Anesthesia Plan Comments: (Pt refuses SAB.)       Anesthesia Quick Evaluation

## 2021-12-15 NOTE — Consult Note (Addendum)
ORTHOPAEDIC CONSULTATION  REQUESTING PHYSICIAN: Elodia Florence., *  PCP:  Sueanne Margarita, DO  Chief Complaint: Right hip pain, femoral neck fracture  HPI: Alexa [name] is a 88 y.o. female who presented to [facility] ED via EMS after a fall at home on 12/14/21. She was unloading the dishwasher when she sustained a ground level fall with immediate pain. Workup in the ER revealed impacted right femoral neck fracture. Orthopaedics were consulted for evaluation and management.  Today, on exam she is resting in bed. She is very pleasant. She tells me she lives alone at home and typically ambulates with a rollator. She is still driving and doing all daily activities independently. She has a son who lives in [place] named Reeds. She takes aspirin 81 mg daily at baseline. She is unsure if she is a diabetic, but believes she was told this in the past.   I called her son, [name] [phone], to discuss her diagnosis and anticipated treatment plan. We discussed recovery as well, and he has concerns about her returning home. He tells me there have been discussions for years about need for some assistance, but she has not been interested in this.   Past Medical History:  Diagnosis Date   Angio-edema    Asthma    Chronic obstructive pulmonary disease ([place])    Diverticulosis    Diverticulosis of colon (without mention of hemorrhage)    Eczema    Esophageal reflux    History of colonic polyps 06/05/05   hyperplastic   Internal hemorrhoids    Irritable bowel syndrome    Ischemic colitis ([place])    Malignant neoplasm of kidney and other and unspecified urinary organs    renal carcinoma, left nephrectomy in 2008-per patient.   Other and unspecified hyperlipidemia    Personal history of venous thrombosis and embolism    PTE after nephrectomy   Spinal stenosis    Type II or unspecified type diabetes mellitus without mention of complication, not stated as uncontrolled 02/05/2012   pt states it was in  2007   Unspecified essential hypertension    Urticaria    Past Surgical History:  Procedure Laterality Date   ADENOIDECTOMY     BREAST BIOPSY     COLONOSCOPY W/ POLYPECTOMY  2000   COLONOSCOPY W/ POLYPECTOMY  07/2004   Hyperplastic polyps   DILATION AND CURETTAGE OF UTERUS  03/2005   Uterine Polyps   NEPHRECTOMY  2007   right,Dr Dahlstedt   SEPTOPLASTY     TONSILLECTOMY     Social History   Socioeconomic History   Marital status: Married    Spouse name: Not on file   Number of children: Not on file   Years of education: Not on file   Highest education level: Not on file  Occupational History   Not on file  Tobacco Use   Smoking status: Never   Smokeless tobacco: Never  Vaping Use   Vaping Use: Never used  Substance and Sexual Activity   Alcohol use: No   Drug use: No   Sexual activity: Not on file  Other Topics Concern   Not on file  Social History Narrative   Not on file   Social Determinants of Health   Financial Resource Strain: Not on file  Food Insecurity: Not on file  Transportation Needs: Not on file  Physical Activity: Not on file  Stress: Not on file  Social Connections: Not on file   Family History  Problem Relation Age of Onset   Stroke Mother        onset in 64s   Diabetes Mother    Cancer Mother        bladder; nephrectomy for calculi/also uterine , TAH & BSO   Aneurysm Mother        thoracic   Depression Mother    Stroke Brother    Heart failure Brother    Heart attack Other        maternal family history   Heart failure Maternal Grandfather    Lung cancer Father        smoked   Heart failure Sister    Depression Sister    Alzheimer's disease Sister    COPD Sister    Aneurysm Brother        cns   Depression Maternal Aunt        X2   Bipolar disorder Sister    Alcoholism Neg Hx    Allergies  Allergen Reactions   Lisinopril     Cough D/Ced by Dr Melvyn Novas   Codeine     nausea   Verapamil     Pain in feet   Prior to  Admission medications   Medication Sig Start Date End Date Taking? Authorizing Provider  amLODipine (NORVASC) 5 MG tablet Take 1 tablet (5 mg total) by mouth daily. 07/27/20   Copland, Gay Filler, MD  Ascorbic Acid (VITAMIN C) POWD Take 1 packet by mouth 2 (two) times daily as needed (cold symptoms).    [provider]  aspirin EC 81 MG tablet Take 162 mg by mouth daily.    [provider]  Benzalkonium Chloride (DIABETIC BASICS HEALTHY FOOT EX) Apply 1 application topically daily as needed (foot pain). OTC diabetic foot cream    [provider]  Blood Glucose Monitoring Suppl (TRUE METRIX METER) w/Device KIT Use as directed to monitor glucose up to 2 times daily. E11.9 dx code 10/04/20   Copland, Gay Filler, MD  busPIRone (BUSPAR) 7.5 MG tablet Take 2 tablets (15 mg total) by mouth 2 (two) times daily. May take one 4x a day if preferred 04/28/20   Copland, Gay Filler, MD  CALCIUM PO Take 1 tablet by mouth daily.    [provider]  cetirizine (ZYRTEC ALLERGY) 10 MG tablet Take 1/2 tablet to 1 tablet daily. 01/19/21   Garnet Sierras, DO  Cholecalciferol (VITAMIN D3) 1.25 MG (50000 UT) CAPS Take 1 weekly for 12 weeks 10/18/20   Copland, Gay Filler, MD  CRANBERRY PO Take 2 capsules by mouth 2 (two) times daily. 55m    [provider]  EPINEPHrine 0.3 mg/0.3 mL IJ SOAJ injection Inject 0.3 mg into the muscle as needed for anaphylaxis. 01/19/21   KGarnet Sierras DO  ESTRACE VAGINAL 0.1 MG/GM vaginal cream Apply locally every other night at bedtime 01/16/16   [provider]  fluticasone (FLONASE) 50 MCG/ACT nasal spray INSTILL 2 SPRAYS INTO EACH NOSTRIL ONCE DAILY 04/28/20   Copland, JGay Filler MD  gabapentin (NEURONTIN) 100 MG capsule Take 1 capsule (100 mg total) by mouth 3 (three) times daily. Take one capsule by mouth three times daily. 04/28/20   Copland, JGay Filler MD  glucose blood (TRUETEST TEST) test strip Use to test blood sugar ICD 10 E11 9 07/14/14    HHendricks Limes MD  HYDROcodone-acetaminophen (NORCO/VICODIN) 5-325 MG tablet Take 1 tablet by mouth 2 (two) times daily as needed. 11/06/21   [provider]  hydrOXYzine (ATARAX) 10 MG tablet Take 10 mg by mouth daily as needed. 11/13/21   [provider]  Multiple Vitamins-Minerals (EYE VITAMINS PO) Take by mouth. AREDS2    [provider]  sitaGLIPtin (JANUVIA) 50 MG tablet Take 1 tablet (50 mg total) by mouth daily. 10/19/20   Copland, Gay Filler, MD  triamcinolone (KENALOG) 0.025 % cream APPLY 1 APPLICATION TOPICALLY TWICE DAILY AS NEEDED FOR DRY SKIN ON BACK 04/28/20   Copland, Gay Filler, MD  TRUEplus Lancets 33G MISC Use as directed to check glucose up to 2 times daily. E11.9 dx code 10/04/20   Copland, Gay Filler, MD  Vitamin D, Ergocalciferol, (DRISDOL) 1.25 MG (50000 UNIT) CAPS capsule Take 50,000 Units by mouth once a week. 11/29/21   [provider]   DG Chest 1 View  Result Date: 12/14/2021 CLINICAL DATA:  Fall with chest and right hip pain. EXAM: DG HIP (WITH OR WITHOUT PELVIS) 2-3V RIGHT; CHEST  1 VIEW COMPARISON:  PA chest and rib films 05/31/2020, AP pelvis and bilateral hip films 10/22/2017. FINDINGS: Chest: The heart is slightly enlarged. There is chronic aortic tortuosity and atherosclerosis. Stable mediastinal configuration. No vascular congestion is seen.  The lungs are clear of infiltrates. The sulci are sharp. Mild chronic interstitial change both lateral bases. Osteopenia and slight lumbar levoscoliosis. No acute osseous abnormality is seen. AP pelvis and AP and ladder left hip views: There is mild osteopenia. Although a discrete cortical step-off is not seen, there is an interval shortened appearance to the right femoral neck concerning for an impacted transverse subcapital proximal right femoral fracture. Also possible this could have occurred since 10/22/2017 and healed. The proximal left femur is intact AP. No pelvic fracture or diastasis is seen.  There are mild features of degenerative arthrosis of the hips. There is advanced degenerative change of the visualized lower lumbar spine again noted. IMPRESSION: 1. No evidence of acute chest disease.  Mild cardiomegaly. 2. Interval new shortened appearance of the right femoral neck since March 2019. This is worrisome for a subcapital transverse impacted femoral neck fracture although no focal cortical step-off is visible. Consider CT to assess chronicity as this could be chronic. 3. Aortic atherosclerosis. Electronically Signed   By: Telford Nab M.D.   On: 12/14/2021 23:16   CT Head Wo Contrast  Result Date: 12/14/2021 CLINICAL DATA:  Head trauma, minor (Age >= 65y). EXAM: CT HEAD WITHOUT CONTRAST TECHNIQUE: Contiguous axial images were obtained from the base of the skull through the vertex without intravenous contrast. RADIATION DOSE REDUCTION: This exam was performed according to the departmental dose-optimization program which includes automated exposure control, adjustment of the mA and/or kV according to patient size and/or use of iterative reconstruction technique. COMPARISON:  06/06/2017 FINDINGS: Brain: Normal anatomic configuration. Parenchymal volume loss is commensurate with the patient's age. Mild periventricular white matter changes are present likely reflecting the sequela of small vessel ischemia. Remote lacunar infarcts within the left thalamus and right paramedian cerebellar infarct are again identified. No abnormal intra or extra-axial mass lesion or fluid collection. No abnormal mass effect or midline shift. No evidence of acute intracranial hemorrhage or infarct. Ventricular size is normal. Cerebellum unremarkable. Vascular: No asymmetric hyperdense vasculature at the skull base. Skull: Intact Sinuses/Orbits: Paranasal sinuses are clear. Ocular lenses have been removed. Orbits are otherwise unremarkable. Other: Mastoid air cells and middle ear cavities are clear. IMPRESSION: No acute  intracranial abnormality. Stable senescent change and remote infarcts as outlined above. Electronically Signed  By: Fidela Salisbury M.D.   On: 12/14/2021 23:21   CT Hip Right Wo Contrast  Result Date: 12/15/2021 CLINICAL DATA:  Hip trauma, fracture suspected, pain 10/10. EXAM: CT OF THE RIGHT HIP WITHOUT CONTRAST TECHNIQUE: Multidetector CT imaging of the right hip was performed according to the standard protocol. Multiplanar CT image reconstructions were also generated. RADIATION DOSE REDUCTION: This exam was performed according to the departmental dose-optimization program which includes automated exposure control, adjustment of the mA and/or kV according to patient size and/or use of iterative reconstruction technique. COMPARISON:  AP pelvis and right hip series yesterday, AP pelvis and bilateral hip series 10/22/2017. FINDINGS: Bones/Joint/Cartilage Bone mineralization is osteopenic. There is an acute, closed impacted and otherwise nondisplaced transverse oblique fracture of the proximal right humerus with small comminution fragments at the anterior and posterior fracture margins. There are cystic intermixed with sclerotic changes in the posterior subarticular femoral head consistent with osteonecrosis which has a chronic appearance with slight deformity of the overlying cortical surface. There is partial superior joint space loss at the hip with small acetabular and femoral head osteophytes consistent with degenerative arthrosis. There is mild spurring of the pubic symphysis. There are no further fractures in the scanned structures. There appears to be a small right hip joint hemarthrosis. Ligaments Suboptimally evaluated by CT. Muscles and Tendons There is normal muscle bulk. No intramuscular hematoma is seen. Area tendons are intact as far seen but not optimally well evaluated. Soft tissues Visualized portions of the right hemipelvis are unremarkable apart from moderate bladder dilatation. There is no  free fluid, free air or free hemorrhage. There is stranding in the subcutaneous plane in the right buttock inferiorly probably posttraumatic etiology. Additional stranding lateral to the proximal right femur. There is mild scattered calcific plaque in the right external iliac, common iliac and proximal superficial femoral arteries. IMPRESSION: 1. Acute transverse oblique subcapital proximal right femoral fracture, impacted and otherwise not displaced. 2. Osteopenia and degenerative change. 3. Sclerotic and cystic changes in posterior femoral head consistent with likely chronic osteonecrosis. 4. Suspected small right hip joint hemarthrosis. 5. Subcutaneous stranding likely posttraumatic. 6. Bladder dilatation not fully visualized. 7. Remaining findings discussed above. Electronically Signed   By: Telford Nab M.D.   On: 12/15/2021 02:05   DG HIP UNILAT WITH PELVIS 2-3 VIEWS RIGHT  Result Date: 12/14/2021 CLINICAL DATA:  Fall with chest and right hip pain. EXAM: DG HIP (WITH OR WITHOUT PELVIS) 2-3V RIGHT; CHEST  1 VIEW COMPARISON:  PA chest and rib films 05/31/2020, AP pelvis and bilateral hip films 10/22/2017. FINDINGS: Chest: The heart is slightly enlarged. There is chronic aortic tortuosity and atherosclerosis. Stable mediastinal configuration. No vascular congestion is seen.  The lungs are clear of infiltrates. The sulci are sharp. Mild chronic interstitial change both lateral bases. Osteopenia and slight lumbar levoscoliosis. No acute osseous abnormality is seen. AP pelvis and AP and ladder left hip views: There is mild osteopenia. Although a discrete cortical step-off is not seen, there is an interval shortened appearance to the right femoral neck concerning for an impacted transverse subcapital proximal right femoral fracture. Also possible this could have occurred since 10/22/2017 and healed. The proximal left femur is intact AP. No pelvic fracture or diastasis is seen. There are mild features of  degenerative arthrosis of the hips. There is advanced degenerative change of the visualized lower lumbar spine again noted. IMPRESSION: 1. No evidence of acute chest disease.  Mild cardiomegaly. 2. Interval new shortened appearance of the  right femoral neck since March 2019. This is worrisome for a subcapital transverse impacted femoral neck fracture although no focal cortical step-off is visible. Consider CT to assess chronicity as this could be chronic. 3. Aortic atherosclerosis. Electronically Signed   By: Telford Nab M.D.   On: 12/14/2021 23:16    Positive ROS: All other systems have been reviewed and were otherwise negative with the exception of those mentioned in the HPI and as above.  Physical Exam: General: Alert, no acute distress Cardiovascular: No pedal edema Respiratory: No cyanosis, no use of accessory musculature Skin: No lesions in the area of chief complaint Neurologic: Sensation intact distally Psychiatric: Patient is competent for consent with normal mood and affect Lymphatic: No axillary or cervical lymphadenopathy  MUSCULOSKELETAL:  Right Lower extremity: Skin intact without rashes or lesions. Deferred hip ROM secondary to known hip fracture. Sensation intact to the LE. Distal pulses intact.   Assessment: Impacted right femoral neck fracture Diabetes mellitus, historical a1c values 6.5-7.8 over the past several years per chart review. Will repeat.  Plan:  I discussed with Ms. Simer her diagnosis and proposed treatment plan. Recommended treatment is surgical intervention with right total hip arthroplasty. She is very active for her age. Dr. Lyla Glassing is on call tomorrow and will address this then.   I called her son, [name]. He will plan to be at the hospital tomorrow to speak with surgeon. NPO after midnight.    Irving Copas, PA-C Cell [phone]   12/15/2021 10:45 AM

## 2021-12-15 NOTE — Assessment & Plan Note (Addendum)
CT with acute transverse oblique subcapital proximal R femoral fx, impacted and otherwise not displaced - sclerotic and cystic changes in posterior femoral head c/w chronic osteonecrosis, small R hip joint hemarthrossis, postraumatic subcutaneous stranding S/p R total hip arthroplasty, anterior approach 5/21 dvt ppx with eliquis per orthopedics Pain management per ortho Bowel regimen PT/OT, follow recs post op Awaiting placement

## 2021-12-16 ENCOUNTER — Other Ambulatory Visit: Payer: Self-pay

## 2021-12-16 ENCOUNTER — Inpatient Hospital Stay (HOSPITAL_COMMUNITY): Payer: Medicare HMO | Admitting: Anesthesiology

## 2021-12-16 ENCOUNTER — Encounter (HOSPITAL_COMMUNITY)
Admission: EM | Disposition: A | Discharge status: Skilled Nursing Facility | Payer: Self-pay | Source: Home / Self Care | Attending: Family Medicine

## 2021-12-16 ENCOUNTER — Inpatient Hospital Stay (HOSPITAL_COMMUNITY): Payer: Medicare HMO

## 2021-12-16 DIAGNOSIS — Z7984 Long term (current) use of oral hypoglycemic drugs: Secondary | ICD-10-CM

## 2021-12-16 DIAGNOSIS — E119 Type 2 diabetes mellitus without complications: Secondary | ICD-10-CM

## 2021-12-16 DIAGNOSIS — S72001A Fracture of unspecified part of neck of right femur, initial encounter for closed fracture: Secondary | ICD-10-CM | POA: Diagnosis not present

## 2021-12-16 DIAGNOSIS — I10 Essential (primary) hypertension: Secondary | ICD-10-CM

## 2021-12-16 HISTORY — PX: TOTAL HIP ARTHROPLASTY: SHX124

## 2021-12-16 LAB — GLUCOSE, CAPILLARY
Glucose-Capillary: 110 mg/dL — ABNORMAL HIGH (ref 70–99)
Glucose-Capillary: 125 mg/dL — ABNORMAL HIGH (ref 70–99)
Glucose-Capillary: 149 mg/dL — ABNORMAL HIGH (ref 70–99)
Glucose-Capillary: 172 mg/dL — ABNORMAL HIGH (ref 70–99)
Glucose-Capillary: 205 mg/dL — ABNORMAL HIGH (ref 70–99)
Glucose-Capillary: 250 mg/dL — ABNORMAL HIGH (ref 70–99)

## 2021-12-16 LAB — CBC
HCT: 38.2 % (ref 36.0–46.0)
Hemoglobin: 12.6 g/dL (ref 12.0–15.0)
MCH: 29.9 pg (ref 26.0–34.0)
MCHC: 33 g/dL (ref 30.0–36.0)
MCV: 90.5 fL (ref 80.0–100.0)
Platelets: 263 10*3/uL (ref 150–400)
RBC: 4.22 MIL/uL (ref 3.87–5.11)
RDW: 14.2 % (ref 11.5–15.5)
WBC: 8.1 10*3/uL (ref 4.0–10.5)
nRBC: 0 % (ref 0.0–0.2)

## 2021-12-16 LAB — BASIC METABOLIC PANEL
Anion gap: 6 (ref 5–15)
BUN: 23 mg/dL (ref 8–23)
CO2: 28 mmol/L (ref 22–32)
Calcium: 8.6 mg/dL — ABNORMAL LOW (ref 8.9–10.3)
Chloride: 109 mmol/L (ref 98–111)
Creatinine, Ser: 0.67 mg/dL (ref 0.44–1.00)
GFR, Estimated: >60 mL/min (ref >60)
Glucose, Bld: 99 mg/dL (ref 70–99)
Potassium: 3.8 mmol/L (ref 3.5–5.1)
Sodium: 143 mmol/L (ref 135–145)

## 2021-12-16 LAB — SURGICAL PCR SCREEN
MRSA, PCR: NEGATIVE
Staphylococcus aureus: POSITIVE — AB

## 2021-12-16 SURGERY — ARTHROPLASTY, HIP, TOTAL, ANTERIOR APPROACH
Anesthesia: General | Site: Hip | Laterality: Right

## 2021-12-16 MED ORDER — ONDANSETRON HCL 4 MG/2ML IJ SOLN: 4.0000 mg | Freq: Four times a day (QID) | INTRAMUSCULAR | Status: DC | PRN

## 2021-12-16 MED ORDER — ASPIRIN 325 MG PO TBEC
325.0000 mg | DELAYED_RELEASE_TABLET | Freq: Every day | ORAL | Status: DC
  Administered 2021-12-17: 325 mg via ORAL
  Filled 2021-12-16: qty 1

## 2021-12-16 MED ORDER — MENTHOL 3 MG MT LOZG: 1.0000 | LOZENGE | OROMUCOSAL | Status: DC | PRN

## 2021-12-16 MED ORDER — TRANEXAMIC ACID-NACL 1000-0.7 MG/100ML-% IV SOLN
INTRAVENOUS | Status: AC
  Filled 2021-12-16: qty 100

## 2021-12-16 MED ORDER — TRANEXAMIC ACID-NACL 1000-0.7 MG/100ML-% IV SOLN
1000.0000 mg | Freq: Once | INTRAVENOUS | Status: AC
  Administered 2021-12-16: 1000 mg via INTRAVENOUS
  Filled 2021-12-16: qty 100

## 2021-12-16 MED ORDER — METOCLOPRAMIDE HCL 5 MG/ML IJ SOLN: 5.0000 mg | Freq: Three times a day (TID) | INTRAMUSCULAR | Status: DC | PRN

## 2021-12-16 MED ORDER — CHLORHEXIDINE GLUCONATE 4 % EX LIQD: 60.0000 mL | Freq: Once | CUTANEOUS | Status: DC

## 2021-12-16 MED ORDER — CEFAZOLIN SODIUM-DEXTROSE 2-4 GM/100ML-% IV SOLN
2.0000 g | INTRAVENOUS | Status: AC
  Administered 2021-12-16: 2 g via INTRAVENOUS

## 2021-12-16 MED ORDER — FENTANYL CITRATE (PF) 100 MCG/2ML IJ SOLN
INTRAMUSCULAR | Status: DC | PRN
  Administered 2021-12-16 (×3): 50 ug via INTRAVENOUS

## 2021-12-16 MED ORDER — METHOCARBAMOL 1000 MG/10ML IJ SOLN: 500.0000 mg | Freq: Four times a day (QID) | INTRAVENOUS | Status: DC | PRN

## 2021-12-16 MED ORDER — ACETAMINOPHEN 325 MG PO TABS: 325.0000 mg | ORAL_TABLET | Freq: Four times a day (QID) | ORAL | Status: DC | PRN

## 2021-12-16 MED ORDER — ACETAMINOPHEN 500 MG PO TABS
ORAL_TABLET | ORAL | Status: AC
  Filled 2021-12-16: qty 2

## 2021-12-16 MED ORDER — SODIUM CHLORIDE (PF) 0.9 % IJ SOLN
INTRAMUSCULAR | Status: AC
  Filled 2021-12-16: qty 50

## 2021-12-16 MED ORDER — METHOCARBAMOL 500 MG PO TABS
500.0000 mg | ORAL_TABLET | Freq: Four times a day (QID) | ORAL | Status: DC | PRN
  Administered 2021-12-16 – 2021-12-19 (×4): 500 mg via ORAL
  Filled 2021-12-16 (×4): qty 1

## 2021-12-16 MED ORDER — PRONTOSAN WOUND IRRIGATION OPTIME
TOPICAL | Status: DC | PRN
  Administered 2021-12-16: 1 via TOPICAL

## 2021-12-16 MED ORDER — AMISULPRIDE (ANTIEMETIC) 5 MG/2ML IV SOLN: 10.0000 mg | Freq: Once | INTRAVENOUS | Status: DC | PRN

## 2021-12-16 MED ORDER — CEFAZOLIN SODIUM-DEXTROSE 2-4 GM/100ML-% IV SOLN
2.0000 g | Freq: Four times a day (QID) | INTRAVENOUS | Status: AC
  Administered 2021-12-16 (×2): 2 g via INTRAVENOUS
  Filled 2021-12-16 (×2): qty 100

## 2021-12-16 MED ORDER — SUGAMMADEX SODIUM 200 MG/2ML IV SOLN
INTRAVENOUS | Status: DC | PRN
  Administered 2021-12-16: 200 mg via INTRAVENOUS

## 2021-12-16 MED ORDER — ACETAMINOPHEN 500 MG PO TABS: 1000.0000 mg | ORAL_TABLET | Freq: Once | ORAL | Status: DC

## 2021-12-16 MED ORDER — POVIDONE-IODINE 10 % EX SWAB: 2.0000 "application " | Freq: Once | CUTANEOUS | Status: DC

## 2021-12-16 MED ORDER — ALBUMIN HUMAN 5 % IV SOLN
INTRAVENOUS | Status: DC | PRN
Start: 2021-12-16 — End: 2021-12-16

## 2021-12-16 MED ORDER — SODIUM CHLORIDE 0.9 % IR SOLN
Status: DC | PRN
Start: 2021-12-16 — End: 2021-12-16
  Administered 2021-12-16: 1000 mL
  Administered 2021-12-16: 3000 mL

## 2021-12-16 MED ORDER — TRANEXAMIC ACID-NACL 1000-0.7 MG/100ML-% IV SOLN
1000.0000 mg | INTRAVENOUS | Status: AC
  Administered 2021-12-16: 1000 mg via INTRAVENOUS

## 2021-12-16 MED ORDER — METOCLOPRAMIDE HCL 5 MG PO TABS: 5.0000 mg | ORAL_TABLET | Freq: Three times a day (TID) | ORAL | Status: DC | PRN

## 2021-12-16 MED ORDER — KETOROLAC TROMETHAMINE 30 MG/ML IJ SOLN
INTRAMUSCULAR | Status: AC
Start: 2021-12-16 — End: ?
  Filled 2021-12-16: qty 1

## 2021-12-16 MED ORDER — BUPIVACAINE-EPINEPHRINE (PF) 0.25% -1:200000 IJ SOLN
INTRAMUSCULAR | Status: AC
  Filled 2021-12-16: qty 30

## 2021-12-16 MED ORDER — PHENYLEPHRINE 80 MCG/ML (10ML) SYRINGE FOR IV PUSH (FOR BLOOD PRESSURE SUPPORT)
PREFILLED_SYRINGE | INTRAVENOUS | Status: DC | PRN
  Administered 2021-12-16: 80 ug via INTRAVENOUS
  Administered 2021-12-16: 120 ug via INTRAVENOUS

## 2021-12-16 MED ORDER — KETOROLAC TROMETHAMINE 30 MG/ML IJ SOLN
INTRAMUSCULAR | Status: DC | PRN
  Administered 2021-12-16: 30 mg

## 2021-12-16 MED ORDER — PROPOFOL 10 MG/ML IV BOLUS
INTRAVENOUS | Status: AC
Start: 2021-12-16 — End: ?
  Filled 2021-12-16: qty 20

## 2021-12-16 MED ORDER — DOCUSATE SODIUM 100 MG PO CAPS
100.0000 mg | ORAL_CAPSULE | Freq: Two times a day (BID) | ORAL | Status: DC
  Administered 2021-12-16 – 2021-12-19 (×6): 100 mg via ORAL
  Filled 2021-12-16 (×6): qty 1

## 2021-12-16 MED ORDER — ONDANSETRON HCL 4 MG PO TABS: 4.0000 mg | ORAL_TABLET | Freq: Four times a day (QID) | ORAL | Status: DC | PRN

## 2021-12-16 MED ORDER — LACTATED RINGERS IV SOLN: INTRAVENOUS | Status: DC | PRN

## 2021-12-16 MED ORDER — SODIUM CHLORIDE (PF) 0.9 % IJ SOLN
INTRAMUSCULAR | Status: DC | PRN
  Administered 2021-12-16: 30 mL

## 2021-12-16 MED ORDER — LIDOCAINE 2% (20 MG/ML) 5 ML SYRINGE
INTRAMUSCULAR | Status: DC | PRN
  Administered 2021-12-16: 60 mg via INTRAVENOUS

## 2021-12-16 MED ORDER — ISOPROPYL ALCOHOL 70 % SOLN
Status: DC | PRN
  Administered 2021-12-16: 1 via TOPICAL

## 2021-12-16 MED ORDER — FENTANYL CITRATE (PF) 100 MCG/2ML IJ SOLN
INTRAMUSCULAR | Status: AC
  Filled 2021-12-16: qty 2

## 2021-12-16 MED ORDER — CEFAZOLIN SODIUM-DEXTROSE 2-4 GM/100ML-% IV SOLN
INTRAVENOUS | Status: AC
  Filled 2021-12-16: qty 100

## 2021-12-16 MED ORDER — ONDANSETRON HCL 4 MG/2ML IJ SOLN
INTRAMUSCULAR | Status: DC | PRN
  Administered 2021-12-16: 4 mg via INTRAVENOUS

## 2021-12-16 MED ORDER — ROCURONIUM BROMIDE 10 MG/ML (PF) SYRINGE
PREFILLED_SYRINGE | INTRAVENOUS | Status: DC | PRN
  Administered 2021-12-16: 10 mg via INTRAVENOUS
  Administered 2021-12-16: 30 mg via INTRAVENOUS

## 2021-12-16 MED ORDER — DEXAMETHASONE SODIUM PHOSPHATE 10 MG/ML IJ SOLN
INTRAMUSCULAR | Status: DC | PRN
  Administered 2021-12-16: 10 mg via INTRAVENOUS

## 2021-12-16 MED ORDER — PHENOL 1.4 % MT LIQD: 1.0000 | OROMUCOSAL | Status: DC | PRN

## 2021-12-16 MED ORDER — HYDROCODONE-ACETAMINOPHEN 5-325 MG PO TABS: 1.0000 | ORAL_TABLET | ORAL | Status: DC | PRN

## 2021-12-16 MED ORDER — FENTANYL CITRATE PF 50 MCG/ML IJ SOSY: 25.0000 ug | PREFILLED_SYRINGE | INTRAMUSCULAR | Status: DC | PRN

## 2021-12-16 MED ORDER — PROPOFOL 10 MG/ML IV BOLUS
INTRAVENOUS | Status: DC | PRN
  Administered 2021-12-16: 70 mg via INTRAVENOUS

## 2021-12-16 MED ORDER — HYDROCODONE-ACETAMINOPHEN 7.5-325 MG PO TABS
1.0000 | ORAL_TABLET | ORAL | Status: DC | PRN
  Administered 2021-12-16 – 2021-12-17 (×3): 1 via ORAL
  Administered 2021-12-17 – 2021-12-18 (×2): 2 via ORAL
  Filled 2021-12-16 (×3): qty 1
  Filled 2021-12-16 (×2): qty 2

## 2021-12-16 MED ORDER — WATER FOR IRRIGATION, STERILE IR SOLN
Status: DC | PRN
  Administered 2021-12-16: 2000 mL

## 2021-12-16 MED ORDER — SODIUM CHLORIDE 0.9 % IV SOLN: INTRAVENOUS | Status: DC

## 2021-12-16 SURGICAL SUPPLY — 61 items
ACE SHELL 3H 52 E HIP (Shell) ×2 IMPLANT
BAG COUNTER SPONGE SURGICOUNT (BAG) IMPLANT
BAG DECANTER FOR FLEXI CONT (MISCELLANEOUS) IMPLANT
BAG ZIPLOCK 12X15 (MISCELLANEOUS) IMPLANT
CHLORAPREP W/TINT 26 (MISCELLANEOUS) ×2 IMPLANT
COVER PERINEAL POST (MISCELLANEOUS) ×2 IMPLANT
COVER SURGICAL LIGHT HANDLE (MISCELLANEOUS) ×2 IMPLANT
DERMABOND ADVANCED (GAUZE/BANDAGES/DRESSINGS) ×1
DERMABOND ADVANCED .7 DNX12 (GAUZE/BANDAGES/DRESSINGS) ×2 IMPLANT
DRAPE IMP U-DRAPE 54X76 (DRAPES) ×2 IMPLANT
DRAPE SHEET LG 3/4 BI-LAMINATE (DRAPES) ×6 IMPLANT
DRAPE STERI IOBAN 125X83 (DRAPES) ×2 IMPLANT
DRAPE U-SHAPE 47X51 STRL (DRAPES) ×4 IMPLANT
DRSG AQUACEL AG ADV 3.5X10 (GAUZE/BANDAGES/DRESSINGS) ×2 IMPLANT
ELECT REM PT RETURN 15FT ADLT (MISCELLANEOUS) ×2 IMPLANT
GAUZE SPONGE 4X4 12PLY STRL (GAUZE/BANDAGES/DRESSINGS) ×2 IMPLANT
GLOVE BIO SURGEON STRL SZ8.5 (GLOVE) ×4 IMPLANT
GLOVE BIOGEL M 7.0 STRL (GLOVE) ×2 IMPLANT
GLOVE BIOGEL PI IND STRL 7.5 (GLOVE) ×1 IMPLANT
GLOVE BIOGEL PI IND STRL 8 (GLOVE) ×1 IMPLANT
GLOVE BIOGEL PI IND STRL 8.5 (GLOVE) ×1 IMPLANT
GLOVE BIOGEL PI INDICATOR 7.5 (GLOVE) ×1
GLOVE BIOGEL PI INDICATOR 8 (GLOVE) ×1
GLOVE BIOGEL PI INDICATOR 8.5 (GLOVE) ×1
GLOVE SURG LX 7.5 STRW (GLOVE) ×2
GLOVE SURG LX STRL 7.5 STRW (GLOVE) ×2 IMPLANT
GOWN SPEC L3 XXLG W/TWL (GOWN DISPOSABLE) ×4 IMPLANT
HANDPIECE INTERPULSE COAX TIP (DISPOSABLE) ×2
HEAD FEM -3XOFST 36XMDLR (Head) IMPLANT
HEAD MODULAR 36MM (Head) ×2 IMPLANT
HOLDER FOLEY CATH W/STRAP (MISCELLANEOUS) ×2 IMPLANT
HOOD PEEL AWAY FLYTE STAYCOOL (MISCELLANEOUS) ×6 IMPLANT
KIT TURNOVER KIT A (KITS) IMPLANT
LINER ACE G7 HIGH 36 SZ E (Liner) ×1 IMPLANT
MANIFOLD NEPTUNE II (INSTRUMENTS) ×2 IMPLANT
MARKER SKIN DUAL TIP RULER LAB (MISCELLANEOUS) ×2 IMPLANT
NDL SAFETY ECLIPSE 18X1.5 (NEEDLE) ×1 IMPLANT
NDL SPNL 18GX3.5 QUINCKE PK (NEEDLE) ×1 IMPLANT
NEEDLE HYPO 18GX1.5 SHARP (NEEDLE) ×2
NEEDLE SPNL 18GX3.5 QUINCKE PK (NEEDLE) ×2 IMPLANT
PACK ANTERIOR HIP CUSTOM (KITS) ×2 IMPLANT
PENCIL SMOKE EVACUATOR (MISCELLANEOUS) IMPLANT
SAW OSC TIP CART 19.5X105X1.3 (SAW) ×2 IMPLANT
SEALER BIPOLAR AQUA 6.0 (INSTRUMENTS) ×2 IMPLANT
SET HNDPC FAN SPRY TIP SCT (DISPOSABLE) ×1 IMPLANT
SHELL ACETAB 3H 52 E HIP (Shell) IMPLANT
SOLUTION PRONTOSAN WOUND 350ML (IRRIGATION / IRRIGATOR) ×2 IMPLANT
SPIKE FLUID TRANSFER (MISCELLANEOUS) ×2 IMPLANT
STAPLER INSORB 30 2030 C-SECTI (MISCELLANEOUS) IMPLANT
STEM FEM CMTLS 13 146 (Stem) ×1 IMPLANT
SUT MNCRL AB 3-0 PS2 18 (SUTURE) ×2 IMPLANT
SUT MON AB 2-0 CT1 36 (SUTURE) ×2 IMPLANT
SUT STRATAFIX PDO 1 14 VIOLET (SUTURE) ×2
SUT STRATFX PDO 1 14 VIOLET (SUTURE) ×1
SUT VIC AB 2-0 CT1 27 (SUTURE)
SUT VIC AB 2-0 CT1 TAPERPNT 27 (SUTURE) IMPLANT
SUTURE STRATFX PDO 1 14 VIOLET (SUTURE) ×1 IMPLANT
SYR 3ML LL SCALE MARK (SYRINGE) ×2 IMPLANT
TRAY FOLEY MTR SLVR 16FR STAT (SET/KITS/TRAYS/PACK) IMPLANT
TUBE SUCTION HIGH CAP CLEAR NV (SUCTIONS) ×2 IMPLANT
WATER STERILE IRR 1000ML POUR (IV SOLUTION) ×2 IMPLANT

## 2021-12-16 NOTE — Transfer of Care (Signed)
Immediate Anesthesia Transfer of Care Note  Patient: Alexa [name]  Procedure(s) Performed: Procedure(s): TOTAL HIP ARTHROPLASTY ANTERIOR APPROACH (Right)  Patient Location: PACU  Anesthesia Type:General  Level of Consciousness: Alert, Awake, Oriented  Airway & Oxygen Therapy: Patient Spontanous Breathing  Post-op Assessment: Report given to RN  Post vital signs: Reviewed and stable  Last Vitals:  Vitals:   12/16/21 0112 12/16/21 0511  BP: (!) 158/93 (!) 166/83  Pulse: 88 94  Resp: 18 18  Temp: 36.8 C 37 C  SpO2: 27% (!) 25%    Complications: No apparent anesthesia complications

## 2021-12-16 NOTE — Discharge Instructions (Addendum)
 Dr. Brian Swinteck Joint Replacement Specialist [facility] [address] [place], [place] [address] [phone]   TOTAL HIP REPLACEMENT POSTOPERATIVE DIRECTIONS    Hip Rehabilitation, Guidelines Following Surgery   WEIGHT BEARING Weight bearing as tolerated with assist device (walker, cane, etc) as directed, use it as long as suggested by your surgeon or therapist, typically at least 4-6 weeks.  The results of a hip operation are greatly improved after range of motion and muscle strengthening exercises. Follow all safety measures which are given to protect your hip. If any of these exercises cause increased pain or swelling in your joint, decrease the amount until you are comfortable again. Then slowly increase the exercises. Call your caregiver if you have problems or questions.   HOME CARE INSTRUCTIONS  Most of the following instructions are designed to prevent the dislocation of your new hip.  Remove items at home which could result in a fall. This includes throw rugs or furniture in walking pathways.  Continue medications as instructed at time of discharge. You may have some home medications which will be placed on hold until you complete the course of blood thinner medication. You may start showering once you are discharged home. Do not remove your dressing. Do not put on socks or shoes without following the instructions of your caregivers.   Sit on chairs with arms. Use the chair arms to help push yourself up when arising.  Arrange for the use of a toilet seat elevator so you are not sitting low.  Walk with walker as instructed.  You may resume a sexual relationship in one month or when given the OK by your caregiver.  Use walker as long as suggested by your caregivers.  You may put full weight on your legs and walk as much as is comfortable. Avoid periods of inactivity such as sitting longer than an hour when not asleep. This helps prevent blood  clots.  You may return to work once you are cleared by your surgeon.  Do not drive a car for 6 weeks or until released by your surgeon.  Do not drive while taking narcotics.  Wear elastic stockings for two weeks following surgery during the day but you may remove then at night.  Make sure you keep all of your appointments after your operation with all of your doctors and caregivers. You should call the office at the above phone number and make an appointment for approximately two weeks after the date of your surgery. Please pick up a stool softener and laxative for home use as long as you are requiring pain medications. ICE to the affected hip every three hours for 30 minutes at a time and then as needed for pain and swelling. Continue to use ice on the hip for pain and swelling from surgery. You may notice swelling that will progress down to the foot and ankle.  This is normal after surgery.  Elevate the leg when you are not up walking on it.   It is important for you to complete the blood thinner medication as prescribed by your doctor. Continue to use the breathing machine which will help keep your temperature down.  It is common for your temperature to cycle up and down following surgery, especially at night when you are not up moving around and exerting yourself.  The breathing machine keeps your lungs expanded and your temperature down.  RANGE OF MOTION AND STRENGTHENING EXERCISES  These exercises are designed to help you   keep full movement of your hip joint. Follow your caregiver's or physical therapist's instructions. Perform all exercises about fifteen times, three times per day or as directed. Exercise both hips, even if you have had only one joint replacement. These exercises can be done on a training (exercise) mat, on the floor, on a table or on a bed. Use whatever works the best and is most comfortable for you. Use music or television while you are exercising so that the exercises are a  pleasant break in your day. This will make your life better with the exercises acting as a break in routine you can look forward to.  ?Lying on your back, slowly slide your foot toward your buttocks, raising your knee up off the floor. Then slowly slide your foot back down until your leg is straight again.  ?Lying on your back spread your legs as far apart as you can without causing discomfort.  ?Lying on your side, raise your upper leg and foot straight up from the floor as far as is comfortable. Slowly lower the leg and repeat.  ?Lying on your back, tighten up the muscle in the front of your thigh (quadriceps muscles). You can do this by keeping your leg straight and trying to raise your heel off the floor. This helps strengthen the largest muscle supporting your knee.  ?Lying on your back, tighten up the muscles of your buttocks both with the legs straight and with the knee bent at a comfortable angle while keeping your heel on the floor.  ? ?SKILLED REHAB INSTRUCTIONS: ?If the patient is transferred to a skilled rehab facility following release from the hospital, a list of the current medications will be sent to the facility for the patient to continue.  When discharged from the skilled rehab facility, please have the facility set up the patient's Home Health Physical Therapy prior to being released. Also, the skilled facility will be responsible for providing the patient with their medications at time of release from the facility to include their pain medication and their blood thinner medication. If the patient is still at the rehab facility at time of the two week follow up appointment, the skilled rehab facility will also need to assist the patient in arranging follow up appointment in our office and any transportation needs. ? ?POST-OPERATIVE OPIOID TAPER INSTRUCTIONS: ?It is important to wean off of your opioid medication as soon as possible. If you do not need pain medication after your surgery it is ok  to stop day one. ?Opioids include: ?Codeine, Hydrocodone(Norco, Vicodin), Oxycodone(Percocet, oxycontin) and hydromorphone amongst others.  ?Long term and even short term use of opiods can cause: ?Increased pain response ?Dependence ?Constipation ?Depression ?Respiratory depression ?And more.  ?Withdrawal symptoms can include ?Flu like symptoms ?Nausea, vomiting ?And more ?Techniques to manage these symptoms ?Hydrate well ?Eat regular healthy meals ?Stay active ?Use relaxation techniques(deep breathing, meditating, yoga) ?Do Not substitute Alcohol to help with tapering ?If you have been on opioids for less than two weeks and do not have pain than it is ok to stop all together.  ?Plan to wean off of opioids ?This plan should start within one week post op of your joint replacement. ?Maintain the same interval or time between taking each dose and first decrease the dose.  ?Cut the total daily intake of opioids by one tablet each day ?Next start to increase the time between doses. ?The last dose that should be eliminated is the evening dose.  ? ? ?MAKE   SURE YOU:  Understand these instructions.  Will watch your condition.  Will get help right away if you are not doing well or get worse.  Pick up stool softner and laxative for home use following surgery while on pain medications. Do not remove your dressing. The dressing is waterproof--it is OK to take showers. Continue to use ice for pain and swelling after surgery. Do not use any lotions or creams on the incision until instructed by your surgeon. Total Hip Protocol.   Information on my medicine - ELIQUIS (apixaban)  Why was Eliquis prescribed for you? Eliquis was prescribed for you to reduce the risk of blood clots forming after orthopedic surgery.    What do You need to know about Eliquis? Take your Eliquis TWICE DAILY - one tablet in the morning and one tablet in the evening with or without food.  It would be best to take the dose about the  same time each day.  If you have difficulty swallowing the tablet whole please discuss with your pharmacist how to take the medication safely.  Take Eliquis exactly as prescribed by your doctor and DO NOT stop taking Eliquis without talking to the doctor who prescribed the medication.  Stopping without other medication to take the place of Eliquis may increase your risk of developing a clot.  After discharge, you should have regular check-up appointments with your healthcare provider that is prescribing your Eliquis.  What do you do if you miss a dose? If a dose of ELIQUIS is not taken at the scheduled time, take it as soon as possible on the same day and twice-daily administration should be resumed.  The dose should not be doubled to make up for a missed dose.  Do not take more than one tablet of ELIQUIS at the same time.  Important Safety Information A possible side effect of Eliquis is bleeding. You should call your healthcare provider right away if you experience any of the following: Bleeding from an injury or your nose that does not stop. Unusual colored urine (red or dark brown) or unusual colored stools (red or black). Unusual bruising for unknown reasons. A serious fall or if you hit your head (even if there is no bleeding).  Some medicines may interact with Eliquis and might increase your risk of bleeding or clotting while on Eliquis. To help avoid this, consult your healthcare provider or pharmacist prior to using any new prescription or non-prescription medications, including herbals, vitamins, non-steroidal anti-inflammatory drugs (NSAIDs) and supplements.  This website has more information on Eliquis (apixaban): http://www.eliquis.com/eliquis/home   

## 2021-12-16 NOTE — Op Note (Signed)
OPERATIVE REPORT  SURGEON: Rod Can, MD   ASSISTANT: Laurine Blazer, PA-C.  PREOPERATIVE DIAGNOSIS: Displaced Right femoral neck fracture.   POSTOPERATIVE DIAGNOSIS: Displaced Right femoral neck fracture.   PROCEDURE: Right total hip arthroplasty, anterior approach.   IMPLANTS: Biomet Taperloc Reduced Distal stem, size 13 x 146 mm, high offset. Biomet G7 OsseoTi Cup, size 52 mm. Biomet Vivacit-E liner, size 36 mm, E, neutral. Biomet metal head ball, size 36 - 3 mm.  ANESTHESIA:  General  ANTIBIOTICS: 2g ancef.  ESTIMATED BLOOD LOSS:150 mL    DRAINS: None.  COMPLICATIONS: None   CONDITION: PACU - hemodynamically stable.   BRIEF CLINICAL NOTE: Alexa [name] is a 88 y.o. female with a displaced Right femoral neck fracture. The patient was admitted to the hospitalist service and underwent perioperative risk stratification and medical optimization. The risks, benefits, and alternatives to total hip arthroplasty were explained, and the patient elected to proceed.  PROCEDURE IN DETAIL: The patient was taken to the operating room and general anesthesia was induced on the hospital bed.  The patient was then positioned on the Hana table.  All bony prominences were well padded.  The hip was prepped and draped in the normal sterile surgical fashion.  A time-out was called verifying side and site of surgery. Antibiotics were given within 60 minutes of beginning the procedure.   Bikini incision was made, and the direct anterior approach to the hip was performed through the Hueter interval.  Lateral femoral circumflex vessels were treated with the Auqumantys. The anterior capsule was exposed and an inverted T capsulotomy was made.  Fracture hematoma was encountered and evacuated. The patient was found to have a comminuted Right subcapital femoral neck fracture.  I freshened the femoral neck cut with a saw.  I removed the femoral neck fragment.  A corkscrew was placed into the head and the head  was removed.  This was passed to the back table and was measured. The pubofemoral ligament was released subperiosteally to the lesser trochanter.  Acetabular exposure was achieved, and the pulvinar and labrum were excised. Sequential reaming of the acetabulum was then performed up to a size 51 mm reamer under direct visulization. A 52 mm cup was then opened and impacted into place at approximately 40 degrees of abduction and 20 degrees of anteversion. The final polyethylene liner was impacted into place and acetabular osteophytes were removed.    I then gained femoral exposure taking care to protect the abductors and greater trochanter.  This was performed using standard external rotation, extension, and adduction.  A cookie cutter was used to enter the femoral canal, and then the femoral canal finder was placed.  Sequential broaching was performed up to a size 13.  Calcar planer was used on the femoral neck remnant.  I placed a high offset neck and a trial head ball.  The hip was reduced.  Leg lengths and offset were checked fluoroscopically.  The hip was dislocated and trial components were removed.  The final implants were placed, and the hip was reduced.  Fluoroscopy was used to confirm component position and leg lengths.  At 90 degrees of external rotation and full extension, the hip was stable to an anterior directed force.   The wound was copiously irrigated with Irrisept solution and normal saline using pule lavage.  Marcaine solution was injected into the periarticular soft tissue.  The wound was closed in layers using #1 Stratafix for the fascia, 2-0 Vicryl for the subcutaneous fat, 2-0 Monocryl  for the deep dermal layer, and staples + Dermabond for the skin.  Once the glue was fully dried, an Aquacell Ag dressing was applied.  The patient was transported to the recovery room in stable condition.  Sponge, needle, and instrument counts were correct at the end of the case x2.  The patient tolerated the  procedure well and there were no known complications.  Please note that a surgical assistant was a medical necessity for this procedure to perform it in a safe and expeditious manner. Assistant was necessary to provide appropriate retraction of vital neurovascular structures, to prevent femoral fracture, and to allow for anatomic placement of the prosthesis.

## 2021-12-16 NOTE — Plan of Care (Signed)

## 2021-12-16 NOTE — Anesthesia Procedure Notes (Signed)
Procedure Name: Intubation Date/Time: 12/16/2021 10:54 AM Performed by: Gerald Leitz, CRNA Pre-anesthesia Checklist: Patient identified, Patient being monitored, Timeout performed, Emergency Drugs available and Suction available Patient Re-evaluated:Patient Re-evaluated prior to induction Oxygen Delivery Method: Circle system utilized Preoxygenation: Pre-oxygenation with 100% oxygen Induction Type: IV induction Ventilation: Mask ventilation without difficulty Laryngoscope Size: Mac and 3 Grade View: Grade I Tube type: Oral Tube size: 7.0 mm Number of attempts: 1 Placement Confirmation: ETT inserted through vocal cords under direct vision, positive ETCO2 and breath sounds checked- equal and bilateral Secured at: 21 cm Tube secured with: Tape Dental Injury: Teeth and Oropharynx as per pre-operative assessment

## 2021-12-16 NOTE — Interval H&P Note (Signed)
History and Physical Interval Note:  12/16/2021 10:07 AM  Alexa [name]  has presented today for surgery, with the diagnosis of RIGHT FEMORAL NECK FRACTURE.  The various methods of treatment have been discussed with the patient and family. After consideration of risks, benefits and other options for treatment, the patient has consented to  Procedure(s): TOTAL HIP ARTHROPLASTY ANTERIOR APPROACH (Right) as a surgical intervention.  The patient's history has been reviewed, patient examined, no change in status, stable for surgery.  I have reviewed the patient's chart and labs.  Questions were answered to the patient's satisfaction.    The risks, benefits, and alternatives were discussed with the patient. There are risks associated with the surgery including, but not limited to, problems with anesthesia (death), infection, instability (giving out of the joint), dislocation, differences in leg length/angulation/rotation, fracture of bones, loosening or failure of implants, hematoma (blood accumulation) which may require surgical drainage, blood clots, pulmonary embolism, nerve injury (foot drop and lateral thigh numbness), and blood vessel injury. The patient understands these risks and elects to proceed.    Hilton Cork Kristianna Saperstein

## 2021-12-16 NOTE — Progress Notes (Signed)
PROGRESS NOTE    Alexa [name]  ZRA:[id number] DOB: [birth year] DOA: 12/14/2021 PCP: Sueanne Margarita, DO  Chief Complaint  Patient presents with   Fall    Brief Narrative:  Alexa [name] is Alexa [name] pleasant 88 y.o. female with medical history significant for hypertension, type 2 diabetes mellitus, renal carcinoma status post left nephrectomy in 2008, and mild renal insufficiency, who presents to the emergency department with severe right hip pain after fall at home.  Patient reports that she was doing fairly well last night and loading her dishwasher when she turned, tripped, and fell onto her right hip.  She had severe pain immediately, tried to use her life alert button but it did not work, but she was within reach of her cell phone and called EMS.  She she had been experiencing some general malaise for the past few days following Alexa [name] tooth extraction on 12/10/2021, and was wondering if she might also have Alexa [name] UTI though she denies dysuria or flank pain.  She has not had any recent fever or chills.  She denies any chest pain, nausea, shortness of breath, or diaphoresis.  She is normally very active, lives alone, drives, uses Jakylan Ron walker or cane at times, but is able to go up stairs.   ED Course: Upon arrival to the ED, patient is found to be afebrile and hypertensive.  EKG features sinus rhythm with first-degree AV nodal block.  Chest x-ray with mild cardiomegaly but no acute finding.  Head CT negative for acute intracranial abnormality.  CT of the right hip demonstrates acute transverse oblique subcapital right femur fracture.  Chemistry panel notable for BUN 29 and creatinine 0.99.  Troponin was slightly elevated.  Urinalysis notable for pyuria and positive nitrite.  Orthopedic surgery was consulted by the ED physician and the patient was treated with Rocephin, Zofran, morphine, and Dilaudid.    Assessment & Plan:   Principal Problem:   Closed right hip fracture (HCC) Active Problems:   Controlled type 2  diabetes mellitus without complication, without long-term current use of insulin (HCC)   Elevated troponin   Essential hypertension   Stage 3a chronic kidney disease (CKD) (HCC)   Acute cystitis without hematuria   Acute urinary retention   Assessment and Plan: * Closed right hip fracture (HCC) CT with acute transverse oblique subcapital proximal R femoral fx, impacted and otherwise not displaced - sclerotic and cystic changes in posterior femoral head c/w chronic osteonecrosis, small R hip joint hemarthrossis, postraumatic subcutaneous stranding Surgery 5/21 She notes hx stroke? (I don't see this in hx?).  Also with T2DM.   RCRI 2 - she got around before with walker/rollator.  She did have mildly elevated troponin, but no CP and flat.  No plans for additional workup at this time.   Elevated troponin Asymptomatic Mildly elevated and flat EKG with q's in III and aVF No additional w/u unless she has sx    Controlled type 2 diabetes mellitus without complication, without long-term current use of insulin (HCC) a1c 6.2 SSI  Essential hypertension amlodipine  Stage 3a chronic kidney disease (CKD) (HCC) noted  Acute urinary retention Foley placed 5/20, likely related to UTI and hip fracture/opiates/immobility   Acute cystitis without hematuria Follow cultures -> >100,000 gnrs - follow Continue treatment for now     DVT prophylaxis: SCD Code Status: DNR Family Communication: none Disposition:   Status is: Inpatient Remains inpatient appropriate because: need for orthopedics   Consultants:  ortho  Procedures:  none  Antimicrobials:  Anti-infectives (From admission, onward)    Start     Dose/Rate Route Frequency Ordered Stop   12/16/21 1015  ceFAZolin (ANCEF) IVPB 2g/100 mL premix        2 g 200 mL/hr over 30 Minutes Intravenous On call to O.R. 12/16/21 7124 12/17/21 0559   12/16/21 0928  ceFAZolin (ANCEF) 2-4 GM/100ML-% IVPB       Note to Pharmacy: Dara Lords  M: cabinet override      12/16/21 0928 12/16/21 2144   12/16/21 0100  cefTRIAXone (ROCEPHIN) 1 g in sodium chloride 0.9 % 100 mL IVPB        1 g 200 mL/hr over 30 Minutes Intravenous Every 24 hours 12/15/21 0617     12/15/21 0015  cefTRIAXone (ROCEPHIN) 1 g in sodium chloride 0.9 % 100 mL IVPB        1 g 200 mL/hr over 30 Minutes Intravenous  Once 12/15/21 0002 12/15/21 0124       Subjective: No new complaints Brother and son at bedside  Objective: Vitals:   12/15/21 1431 12/15/21 2049 12/16/21 0112 12/16/21 0511  BP: (!) 147/84 (!) 154/89 (!) 158/93 (!) 166/83  Pulse: 91 96 88 94  Resp: '18 18 18 18  '$ Temp: 98.3 F (36.8 C) 98.4 F (36.9 C) 98.2 F (36.8 C) 98.6 F (37 C)  TempSrc: Oral Oral Oral Oral  SpO2: 96% 100% 94% (!) 89%  Weight:      Height:        Intake/Output Summary (Last 24 hours) at 12/16/2021 0940 Last data filed at 12/16/2021 0815 Gross per 24 hour  Intake 1512.44 ml  Output 1550 ml  Net -37.56 ml   Filed Weights   12/14/21 2155  Weight: 53.5 kg    Examination:  General: No acute distress. Cardiovascular: RRR Lungs: unlabored Abdomen: Soft, nontender, nondistended Neurological: Alert and oriented 3. Moves all extremities 4. Cranial nerves II through XII grossly intact. Extremities: no LEE, hip not examined (pt on way to be transported, son/brother in room)     Data Reviewed: I have personally reviewed following labs and imaging studies  CBC: Recent Labs  Lab 12/14/21 2209 12/16/21 0253  WBC 9.9 8.1  NEUTROABS 6.9  --   HGB 14.8 12.6  HCT 42.5 38.2  MCV 88.7 90.5  PLT 225 580    Basic Metabolic Panel: Recent Labs  Lab 12/14/21 2314 12/16/21 0253  NA 142 143  K 3.7 3.8  CL 110 109  CO2 26 28  GLUCOSE 146* 99  BUN 24* 23  CREATININE 0.99 0.67  CALCIUM 8.6* 8.6*    GFR: Estimated Creatinine Clearance: 35.3 mL/min (by C-G formula based on SCr of 0.67 mg/dL).  Liver Function Tests: No results for input(s): AST, ALT,  ALKPHOS, BILITOT, PROT, ALBUMIN in the last 168 hours.  CBG: Recent Labs  Lab 12/15/21 1718 12/15/21 1927 12/16/21 0001 12/16/21 0343 12/16/21 0744  GLUCAP 119* 114* 172* 110* 125*     Recent Results (from the past 240 hour(s))  Urine Culture     Status: Abnormal (Preliminary result)   Collection Time: 12/14/21  7:13 AM   Specimen: Urine, Clean Catch  Result Value Ref Range Status   Specimen Description   Final    URINE, CLEAN CATCH Performed at [facility], [place] [address] [place], [place] [address]    Special Requests   Final    NONE Performed at [facility], [place] [address] [place], [place]  [address]    Culture (Aayansh Codispoti)  Final    >=100,000 COLONIES/mL GRAM NEGATIVE RODS SUSCEPTIBILITIES TO FOLLOW Performed at [place] [address], [place], [place] [address]    Report Status PENDING  Incomplete  Surgical pcr screen     Status: Abnormal   Collection Time: 12/16/21  5:01 AM   Specimen: Nasal Mucosa; Nasal Swab  Result Value Ref Range Status   MRSA, PCR NEGATIVE NEGATIVE Final   Staphylococcus aureus POSITIVE (Katrell Milhorn) NEGATIVE Final    Comment: (NOTE) The Xpert SA Assay (FDA approved for NASAL specimens in patients 27 years of age and older), is one component of Rosielee Corporan comprehensive surveillance program. It is not intended to diagnose infection nor to guide or monitor treatment. Performed at [facility], [place] [address] [place], [place] [address]          Radiology Studies: DG Chest 1 View  Result Date: 12/14/2021 CLINICAL DATA:  Fall with chest and right hip pain. EXAM: DG HIP (WITH OR WITHOUT PELVIS) 2-3V RIGHT; CHEST  1 VIEW COMPARISON:  PA chest and rib films 05/31/2020, AP pelvis and bilateral hip films 10/22/2017. FINDINGS: Chest: The heart is slightly enlarged. There is chronic aortic tortuosity and atherosclerosis. Stable mediastinal configuration. No vascular congestion is seen.  The lungs are  clear of infiltrates. The sulci are sharp. Mild chronic interstitial change both lateral bases. Osteopenia and slight lumbar levoscoliosis. No acute osseous abnormality is seen. AP pelvis and AP and ladder left hip views: There is mild osteopenia. Although Zykeria Laguardia discrete cortical step-off is not seen, there is an interval shortened appearance to the right femoral neck concerning for an impacted transverse subcapital proximal right femoral fracture. Also possible this could have occurred since 10/22/2017 and healed. The proximal left femur is intact AP. No pelvic fracture or diastasis is seen. There are mild features of degenerative arthrosis of the hips. There is advanced degenerative change of the visualized lower lumbar spine again noted. IMPRESSION: 1. No evidence of acute chest disease.  Mild cardiomegaly. 2. Interval new shortened appearance of the right femoral neck since March 2019. This is worrisome for Audry Pecina subcapital transverse impacted femoral neck fracture although no focal cortical step-off is visible. Consider CT to assess chronicity as this could be chronic. 3. Aortic atherosclerosis. Electronically Signed   By: Telford Nab M.D.   On: 12/14/2021 23:16   CT Head Wo Contrast  Result Date: 12/14/2021 CLINICAL DATA:  Head trauma, minor (Age >= 65y). EXAM: CT HEAD WITHOUT CONTRAST TECHNIQUE: Contiguous axial images were obtained from the base of the skull through the vertex without intravenous contrast. RADIATION DOSE REDUCTION: This exam was performed according to the departmental dose-optimization program which includes automated exposure control, adjustment of the mA and/or kV according to patient size and/or use of iterative reconstruction technique. COMPARISON:  06/06/2017 FINDINGS: Brain: Normal anatomic configuration. Parenchymal volume loss is commensurate with the patient's age. Mild periventricular white matter changes are present likely reflecting the sequela of small vessel ischemia. Remote  lacunar infarcts within the left thalamus and right paramedian cerebellar infarct are again identified. No abnormal intra or extra-axial mass lesion or fluid collection. No abnormal mass effect or midline shift. No evidence of acute intracranial hemorrhage or infarct. Ventricular size is normal. Cerebellum unremarkable. Vascular: No asymmetric hyperdense vasculature at the skull base. Skull: Intact Sinuses/Orbits: Paranasal sinuses are clear. Ocular lenses have been removed. Orbits are otherwise unremarkable. Other: Mastoid air cells and middle ear cavities are clear. IMPRESSION:  No acute intracranial abnormality. Stable senescent change and remote infarcts as outlined above. Electronically Signed   By: Fidela Salisbury M.D.   On: 12/14/2021 23:21   CT Hip Right Wo Contrast  Result Date: 12/15/2021 CLINICAL DATA:  Hip trauma, fracture suspected, pain 10/10. EXAM: CT OF THE RIGHT HIP WITHOUT CONTRAST TECHNIQUE: Multidetector CT imaging of the right hip was performed according to the standard protocol. Multiplanar CT image reconstructions were also generated. RADIATION DOSE REDUCTION: This exam was performed according to the departmental dose-optimization program which includes automated exposure control, adjustment of the mA and/or kV according to patient size and/or use of iterative reconstruction technique. COMPARISON:  AP pelvis and right hip series yesterday, AP pelvis and bilateral hip series 10/22/2017. FINDINGS: Bones/Joint/Cartilage Bone mineralization is osteopenic. There is an acute, closed impacted and otherwise nondisplaced transverse oblique fracture of the proximal right humerus with small comminution fragments at the anterior and posterior fracture margins. There are cystic intermixed with sclerotic changes in the posterior subarticular femoral head consistent with osteonecrosis which has Daijah Scrivens chronic appearance with slight deformity of the overlying cortical surface. There is partial superior joint  space loss at the hip with small acetabular and femoral head osteophytes consistent with degenerative arthrosis. There is mild spurring of the pubic symphysis. There are no further fractures in the scanned structures. There appears to be Delma Drone small right hip joint hemarthrosis. Ligaments Suboptimally evaluated by CT. Muscles and Tendons There is normal muscle bulk. No intramuscular hematoma is seen. Area tendons are intact as far seen but not optimally well evaluated. Soft tissues Visualized portions of the right hemipelvis are unremarkable apart from moderate bladder dilatation. There is no free fluid, free air or free hemorrhage. There is stranding in the subcutaneous plane in the right buttock inferiorly probably posttraumatic etiology. Additional stranding lateral to the proximal right femur. There is mild scattered calcific plaque in the right external iliac, common iliac and proximal superficial femoral arteries. IMPRESSION: 1. Acute transverse oblique subcapital proximal right femoral fracture, impacted and otherwise not displaced. 2. Osteopenia and degenerative change. 3. Sclerotic and cystic changes in posterior femoral head consistent with likely chronic osteonecrosis. 4. Suspected small right hip joint hemarthrosis. 5. Subcutaneous stranding likely posttraumatic. 6. Bladder dilatation not fully visualized. 7. Remaining findings discussed above. Electronically Signed   By: Telford Nab M.D.   On: 12/15/2021 02:05   DG HIP UNILAT WITH PELVIS 2-3 VIEWS RIGHT  Result Date: 12/14/2021 CLINICAL DATA:  Fall with chest and right hip pain. EXAM: DG HIP (WITH OR WITHOUT PELVIS) 2-3V RIGHT; CHEST  1 VIEW COMPARISON:  PA chest and rib films 05/31/2020, AP pelvis and bilateral hip films 10/22/2017. FINDINGS: Chest: The heart is slightly enlarged. There is chronic aortic tortuosity and atherosclerosis. Stable mediastinal configuration. No vascular congestion is seen.  The lungs are clear of infiltrates. The sulci  are sharp. Mild chronic interstitial change both lateral bases. Osteopenia and slight lumbar levoscoliosis. No acute osseous abnormality is seen. AP pelvis and AP and ladder left hip views: There is mild osteopenia. Although Shilo Philipson discrete cortical step-off is not seen, there is an interval shortened appearance to the right femoral neck concerning for an impacted transverse subcapital proximal right femoral fracture. Also possible this could have occurred since 10/22/2017 and healed. The proximal left femur is intact AP. No pelvic fracture or diastasis is seen. There are mild features of degenerative arthrosis of the hips. There is advanced degenerative change of the visualized lower lumbar spine again noted. IMPRESSION:  1. No evidence of acute chest disease.  Mild cardiomegaly. 2. Interval new shortened appearance of the right femoral neck since March 2019. This is worrisome for Kaysia Willard subcapital transverse impacted femoral neck fracture although no focal cortical step-off is visible. Consider CT to assess chronicity as this could be chronic. 3. Aortic atherosclerosis. Electronically Signed   By: Telford Nab M.D.   On: 12/14/2021 23:16        Scheduled Meds:  acetaminophen       acetaminophen  1,000 mg Oral Once   acetaminophen  1,000 mg Oral Q8H   amLODipine  5 mg Oral Daily   chlorhexidine  60 mL Topical Once   feeding supplement  237 mL Oral BID BM   fluticasone  2 spray Each Nare Daily   insulin aspart  0-6 Units Subcutaneous Q4H   multivitamin with minerals  1 tablet Oral Daily   polyethylene glycol  17 g Oral Daily   povidone-iodine  2 application. Topical Once   povidone-iodine  2 application. Topical Once   Continuous Infusions:  ceFAZolin      ceFAZolin (ANCEF) IV     cefTRIAXone (ROCEPHIN)  IV 1 g (12/16/21 0045)   tranexamic acid     tranexamic acid       LOS: 1 day    Time spent: over 30 min    Fayrene Helper, MD [facility]   To contact the attending provider  between 7A-7P or the covering provider during after hours 7P-7A, please log into the web site [email or web address] and access using universal [place] password for that web site. If you do not have the password, please call the hospital operator.  12/16/2021, 9:40 AM

## 2021-12-16 NOTE — Assessment & Plan Note (Addendum)
Foley placed 5/20, likely related to UTI and hip fracture/opiates/immobility  Will plan for trial of void

## 2021-12-16 NOTE — Plan of Care (Signed)
  Problem: Education: Goal: Knowledge of General Education information will improve Description Including pain rating scale, medication(s)/side effects and non-pharmacologic comfort measures Outcome: Progressing   Problem: Health Behavior/Discharge Planning: Goal: Ability to manage health-related needs will improve Outcome: Progressing   

## 2021-12-16 NOTE — Progress Notes (Signed)
Patient has been transported to the O.R.  

## 2021-12-17 DIAGNOSIS — D62 Acute posthemorrhagic anemia: Secondary | ICD-10-CM

## 2021-12-17 DIAGNOSIS — S72001A Fracture of unspecified part of neck of right femur, initial encounter for closed fracture: Secondary | ICD-10-CM | POA: Diagnosis not present

## 2021-12-17 LAB — CBC WITH DIFFERENTIAL/PLATELET
Abs Immature Granulocytes: 0.07 10*3/uL (ref 0.00–0.07)
Basophils Absolute: 0.1 10*3/uL (ref 0.0–0.1)
Basophils Relative: 1 %
Eosinophils Absolute: 0.3 10*3/uL (ref 0.0–0.5)
Eosinophils Relative: 2 %
HCT: 29.8 % — ABNORMAL LOW (ref 36.0–46.0)
Hemoglobin: 10 g/dL — ABNORMAL LOW (ref 12.0–15.0)
Immature Granulocytes: 1 %
Lymphocytes Relative: 13 %
Lymphs Abs: 1.7 10*3/uL (ref 0.7–4.0)
MCH: 30.6 pg (ref 26.0–34.0)
MCHC: 33.6 g/dL (ref 30.0–36.0)
MCV: 91.1 fL (ref 80.0–100.0)
Monocytes Absolute: 1 10*3/uL (ref 0.1–1.0)
Monocytes Relative: 8 %
Neutro Abs: 9.4 10*3/uL — ABNORMAL HIGH (ref 1.7–7.7)
Neutrophils Relative %: 75 %
Platelets: 206 10*3/uL (ref 150–400)
RBC: 3.27 MIL/uL — ABNORMAL LOW (ref 3.87–5.11)
RDW: 13.8 % (ref 11.5–15.5)
WBC: 12.5 10*3/uL — ABNORMAL HIGH (ref 4.0–10.5)
nRBC: 0 % (ref 0.0–0.2)

## 2021-12-17 LAB — GLUCOSE, CAPILLARY
Glucose-Capillary: 108 mg/dL — ABNORMAL HIGH (ref 70–99)
Glucose-Capillary: 119 mg/dL — ABNORMAL HIGH (ref 70–99)
Glucose-Capillary: 134 mg/dL — ABNORMAL HIGH (ref 70–99)
Glucose-Capillary: 138 mg/dL — ABNORMAL HIGH (ref 70–99)
Glucose-Capillary: 148 mg/dL — ABNORMAL HIGH (ref 70–99)
Glucose-Capillary: 159 mg/dL — ABNORMAL HIGH (ref 70–99)

## 2021-12-17 LAB — URINE CULTURE: Culture: >=100000 — AB

## 2021-12-17 LAB — COMPREHENSIVE METABOLIC PANEL
ALT: 10 U/L (ref 0–44)
AST: 22 U/L (ref 15–41)
Albumin: 2.9 g/dL — ABNORMAL LOW (ref 3.5–5.0)
Alkaline Phosphatase: 43 U/L (ref 38–126)
Anion gap: 5 (ref 5–15)
BUN: 24 mg/dL — ABNORMAL HIGH (ref 8–23)
CO2: 27 mmol/L (ref 22–32)
Calcium: 8.3 mg/dL — ABNORMAL LOW (ref 8.9–10.3)
Chloride: 108 mmol/L (ref 98–111)
Creatinine, Ser: 0.86 mg/dL (ref 0.44–1.00)
GFR, Estimated: >60 mL/min (ref >60)
Glucose, Bld: 118 mg/dL — ABNORMAL HIGH (ref 70–99)
Potassium: 4.1 mmol/L (ref 3.5–5.1)
Sodium: 140 mmol/L (ref 135–145)
Total Bilirubin: 0.7 mg/dL (ref 0.3–1.2)
Total Protein: 5.4 g/dL — ABNORMAL LOW (ref 6.5–8.1)

## 2021-12-17 MED ORDER — FERROUS SULFATE 325 (65 FE) MG PO TABS
325.0000 mg | ORAL_TABLET | Freq: Every day | ORAL | Status: DC
  Administered 2021-12-19: 325 mg via ORAL
  Filled 2021-12-17: qty 1

## 2021-12-17 MED ORDER — APIXABAN 2.5 MG PO TABS: 2.5000 mg | ORAL_TABLET | Freq: Two times a day (BID) | ORAL | 0 refills | Status: DC

## 2021-12-17 MED ORDER — CEFADROXIL 500 MG PO CAPS
500.0000 mg | ORAL_CAPSULE | Freq: Two times a day (BID) | ORAL | Status: DC
  Administered 2021-12-17 – 2021-12-19 (×5): 500 mg via ORAL
  Filled 2021-12-17 (×6): qty 1

## 2021-12-17 MED ORDER — INSULIN ASPART 100 UNIT/ML IJ SOLN: 0.0000 [IU] | Freq: Every day | INTRAMUSCULAR | Status: DC

## 2021-12-17 MED ORDER — BISACODYL 10 MG RE SUPP
10.0000 mg | Freq: Once | RECTAL | Status: AC
  Administered 2021-12-18: 10 mg via RECTAL
  Filled 2021-12-17: qty 1

## 2021-12-17 MED ORDER — INSULIN ASPART 100 UNIT/ML IJ SOLN
0.0000 [IU] | Freq: Three times a day (TID) | INTRAMUSCULAR | Status: DC
  Administered 2021-12-18: 1 [IU] via SUBCUTANEOUS
  Administered 2021-12-18 – 2021-12-19 (×2): 2 [IU] via SUBCUTANEOUS
  Administered 2021-12-19: 1 [IU] via SUBCUTANEOUS
  Administered 2021-12-19: 2 [IU] via SUBCUTANEOUS

## 2021-12-17 MED ORDER — APIXABAN 2.5 MG PO TABS
2.5000 mg | ORAL_TABLET | Freq: Two times a day (BID) | ORAL | Status: DC
  Administered 2021-12-17 – 2021-12-19 (×4): 2.5 mg via ORAL
  Filled 2021-12-17 (×4): qty 1

## 2021-12-17 MED ORDER — HYDROCODONE-ACETAMINOPHEN 5-325 MG PO TABS: 1.0000 | ORAL_TABLET | ORAL | 0 refills | Status: AC | PRN

## 2021-12-17 NOTE — Progress Notes (Signed)
    Subjective:  Patient reports pain as mild to moderate.  Denies N/V/CP/SOB/Abd pain. Patient states she is only having mild pain when she moves her leg. She states she got up with therapy today. She reports overnight that one of the medications she received made her feel loopy but she is unsure which one. She states that she cannot have NSAIDs due to only having one kidney. Overall she is doing well today. Denies tingling and numbness in LE bilaterally.   Objective:   VITALS:   Vitals:   12/16/21 2010 12/17/21 0000 12/17/21 0522 12/17/21 1058  BP: 127/75 129/62 121/68 (!) 151/94  Pulse: 85 92 83 96  Resp: '16 16 17 16  '$ Temp: 98.4 F (36.9 C) 98.2 F (36.8 C) 98.2 F (36.8 C) 99.2 F (37.3 C)  TempSrc: Oral Oral Oral   SpO2: 92% 95% 100% 91%  Weight:      Height:        She is sitting up in bed having lunch with brother at bedside. NAD. Neurologically intact ABD soft Neurovascular intact Sensation intact distally Intact pulses distally Dorsiflexion/Plantar flexion intact Incision: dressing C/D/I No cellulitis present Compartment soft  Lab Results  Component Value Date   WBC 12.5 (H) 12/17/2021   HGB 10.0 (L) 12/17/2021   HCT 29.8 (L) 12/17/2021   MCV 91.1 12/17/2021   PLT 206 12/17/2021   BMET    Component Value Date/Time   NA 140 12/17/2021 0739   K 4.1 12/17/2021 0739   CL 108 12/17/2021 0739   CO2 27 12/17/2021 0739   GLUCOSE 118 (H) 12/17/2021 0739   BUN 24 (H) 12/17/2021 0739   CREATININE 0.86 12/17/2021 0739   CREATININE 1.24 (H) 04/03/2018 1552   CALCIUM 8.3 (L) 12/17/2021 0739   GFRNONAA >60 12/17/2021 0739     Assessment/Plan: 1 Day Post-Op   Principal Problem:   Closed right hip fracture (HCC) Active Problems:   Controlled type 2 diabetes mellitus without complication, without long-term current use of insulin (HCC)   Essential hypertension   Stage 3a chronic kidney disease (CKD) (HCC)   Acute cystitis without hematuria   Elevated  troponin   Acute urinary retention   Acute postoperative anemia due to expected blood loss  S/p right THA from right hip fracture.   WBAT with walker DVT ppx: Eliquis 2.'5mg'$  BID due to history of PE, SCDs, TEDS PO pain control: patient currently taking hydrocodone, dilaudid, methocarbamol PT/OT: Patient was able to sit to stand and transfer with PT, limited due to pain with motion. Continue to work with PT. Dispo: Likely d/c to SNF as patient lives at home alone. Hospitalist to decide when medically ready for d/c to SNF. Continue to work with PT from orthopedic standpoint. Pain medication and DVT ppx printed in chart.     Charlott Rakes 12/17/2021, 1:11 PM   [facility]  [place] [address], [address], [place], [place] [address] Phone: [phone] [email or web address]  [employer]

## 2021-12-17 NOTE — Evaluation (Signed)
Occupational Therapy Evaluation Patient Details Name: Alexa [name] MRN: [id number] DOB: Dec 09, 1929 Today's Date: 12/17/2021   History of Present Illness 88 y.o. female with medical history significant for hypertension, type 2 diabetes mellitus, renal carcinoma status post left nephrectomy in 2008, and mild renal insufficiency, who presents to the emergency department with severe right hip pain after fall at home. ortho consulted. pt with R femoral neck fx. s/p R  DA THA on 12/16/21.   Clinical Impression   Patient is a 88 year old female who was admitted for above. Patient was noted to have increased pain impacting ability to participate in ADLs and bed mobility on this date. Patient was noted to have decreased functional activity tolerance, decreased endurance,decreased sitting balance, decreased endurance, and decreased safety awareness impacting participation in ADLs.  Patient lives at home alone PLOF, patient would benefit from short term rehab stay prior to transitioning home to increase independence and safety with ADLs prior to transitioning home.      Recommendations for follow up therapy are one component of a multi-disciplinary discharge planning process, led by the attending physician.  Recommendations may be updated based on patient status, additional functional criteria and insurance authorization.   Follow Up Recommendations  Skilled nursing-short term rehab (<3 hours/day)    Assistance Recommended at Discharge Frequent or constant Supervision/Assistance  Patient can return home with the following A lot of help with bathing/dressing/bathroom;Assistance with cooking/housework;Direct supervision/assist for financial management;Assist for transportation;Help with stairs or ramp for entrance;Direct supervision/assist for medications management;Two people to help with walking and/or transfers    Functional Status Assessment  Patient has had a recent decline in their functional status  and demonstrates the ability to make significant improvements in function in a reasonable and predictable amount of time.  Equipment Recommendations  None recommended by OT    Recommendations for Other Services       Precautions / Restrictions Precautions Precautions: Fall Restrictions Weight Bearing Restrictions: No Other Position/Activity Restrictions: WBAT      Mobility Bed Mobility Overal bed mobility: Needs Assistance Bed Mobility: Supine to Sit     Supine to sit: Min assist     General bed mobility comments: with increased time and physical assistance for RLE    Transfers                          Balance Overall balance assessment: Needs assistance, History of Falls Sitting-balance support: Feet supported, Single extremity supported Sitting balance-Leahy Scale: Fair                                     ADL either performed or assessed with clinical judgement   ADL Overall ADL's : Needs assistance/impaired Eating/Feeding: Set up;Sitting   Grooming: Set up;Sitting;Brushing hair Grooming Details (indicate cue type and reason): EOB Upper Body Bathing: Minimal assistance;Sitting   Lower Body Bathing: Maximal assistance;Bed level   Upper Body Dressing : Minimal assistance;Sitting   Lower Body Dressing: Maximal assistance;Bed level   Toilet Transfer: +2 for physical assistance;+2 for safety/equipment Toilet Transfer Details (indicate cue type and reason): patient declined to transfer stating she was having too much pain in R hip and groin areas with patient insistent on attempting to compelte things herself without physical assist when patient could benefit from physical assistnace. patient was able to scoot to head of bed with min A with increased time. Toileting-  Clothing Manipulation and Hygiene: Maximal assistance;Bed level       Functional mobility during ADLs: +2 for safety/equipment;+2 for physical assistance       Vision  Patient Visual Report: No change from baseline       Perception     Praxis      Pertinent Vitals/Pain Pain Assessment Pain Assessment: Faces Faces Pain Scale: Hurts whole lot Pain Location: R hip Pain Descriptors / Indicators: Grimacing, Sore, Aching, Burning Pain Intervention(s): Limited activity within patient's tolerance, Monitored during session, Premedicated before session, Repositioned     Hand Dominance     Extremity/Trunk Assessment Upper Extremity Assessment Upper Extremity Assessment: LUE deficits/detail LUE Deficits / Details: h/o shoulder injur able to AROM above 110 degrees noted to have signs of ulnar drift bilaterally with englarged joints in digits.   Lower Extremity Assessment Lower Extremity Assessment: Defer to PT evaluation RLE Deficits / Details: ankle WFL, knee and hip strength and AROM ltd by pain RLE: Unable to fully assess due to pain LLE Deficits / Details: AROM grossly WFL, strength 3 to 3+/5, testing limtied by pain   Cervical / Trunk Assessment Cervical / Trunk Assessment: Normal   Communication Communication Communication: No difficulties   Cognition Arousal/Alertness: Awake/alert Behavior During Therapy: WFL for tasks assessed/performed Overall Cognitive Status: Impaired/Different from baseline Area of Impairment: Attention, Following commands, Problem solving, Safety/judgement                   Current Attention Level: Sustained   Following Commands: Follows multi-step commands inconsistently, Follows one step commands with increased time Safety/Judgement: Decreased awareness of safety, Decreased awareness of deficits   Problem Solving: Difficulty sequencing, Requires verbal cues General Comments: patient was noted to need increased redirection for tasks during session. patient noted to be tangental at times and had a hard time with identifying where her pain was     General Comments       Exercises     Shoulder  Instructions      Home Living Family/patient expects to be discharged to:: Skilled nursing facility Living Arrangements: Alone Available Help at Discharge: Family Type of Home: House Home Access: Stairs to enter Technical brewer of Steps: 5 Entrance Stairs-Rails: Right Home Layout: One level               [place]: [place] - single point;Rollator (4 wheels)   Additional Comments: drives      Prior Functioning/Environment Prior Level of Function : Independent/Modified Independent                        OT Problem List: Decreased activity tolerance;Impaired balance (sitting and/or standing);Decreased safety awareness;Cardiopulmonary status limiting activity;Decreased knowledge of precautions;Decreased knowledge of use of DME or AE      OT Treatment/Interventions: Self-care/ADL training;Therapeutic exercise;Neuromuscular education;Energy conservation;DME and/or AE instruction;Therapeutic activities;Balance training;Patient/family education    OT Goals(Current goals can be found in the care plan section) Acute Rehab OT Goals Patient Stated Goal: to get pain under control OT Goal Formulation: With patient Time For Goal Achievement: 12/31/21 Potential to Achieve Goals: Good  OT Frequency: Min 2X/week    Co-evaluation              AM-PAC OT "6 Clicks" Daily Activity     Outcome Measure Help from another person eating meals?: A Little Help from another person taking care of personal grooming?: A Little Help from another person toileting, which includes using toliet, bedpan, or urinal?: Total  Help from another person bathing (including washing, rinsing, drying)?: A Lot Help from another person to put on and taking off regular upper body clothing?: A Little Help from another person to put on and taking off regular lower body clothing?: A Lot 6 Click Score: 14   End of Session Nurse Communication: Patient requests pain meds  Activity Tolerance:  Patient limited by pain Patient left: in bed;with call bell/phone within reach;with bed alarm set  OT Visit Diagnosis: Unsteadiness on feet (R26.81);Pain;History of falling (Z91.81) Pain - Right/Left: Right Pain - part of body: Hip                Time: 1527-1550 OT Time Calculation (min): 23 min Charges:  OT General Charges $OT Visit: 1 Visit OT Evaluation $OT Eval Moderate Complexity: 1 Mod OT Treatments $Therapeutic Activity: 8-22 mins  Jackelyn Poling OTR/L, MS Acute Rehabilitation Department Office# [phone] Pager# [phone]   Marcellina Millin 12/17/2021, 3:58 PM

## 2021-12-17 NOTE — Progress Notes (Signed)
PROGRESS NOTE    Alexa [name]  EHO:[id number] DOB: 06-13-1930 DOA: 12/14/2021 PCP: Sueanne Margarita, DO  Chief Complaint  Patient presents with   Fall    Brief Narrative:  Alexa [name] is Alexa [name] pleasant 88 y.o. female with medical history significant for hypertension, type 2 diabetes mellitus, renal carcinoma status post left nephrectomy in 2008, and mild renal insufficiency, who presents to the emergency department with severe right hip pain after fall at home.  Patient reports that she was doing fairly well last night and loading her dishwasher when she turned, tripped, and fell onto her right hip.  She had severe pain immediately, tried to use her life alert button but it did not work, but she was within reach of her cell phone and called EMS.  She she had been experiencing some general malaise for the past few days following Maleka Contino tooth extraction on 12/10/2021, and was wondering if she might also have Malekai Markwood UTI though she denies dysuria or flank pain.  She has not had any recent fever or chills.  She denies any chest pain, nausea, shortness of breath, or diaphoresis.  She is normally very active, lives alone, drives, uses Cassy Sprowl walker or cane at times, but is able to go up stairs.   ED Course: Upon arrival to the ED, patient is found to be afebrile and hypertensive.  EKG features sinus rhythm with first-degree AV nodal block.  Chest x-ray with mild cardiomegaly but no acute finding.  Head CT negative for acute intracranial abnormality.  CT of the right hip demonstrates acute transverse oblique subcapital right femur fracture.  Chemistry panel notable for BUN 29 and creatinine 0.99.  Troponin was slightly elevated.  Urinalysis notable for pyuria and positive nitrite.  Orthopedic surgery was consulted by the ED physician and the patient was treated with Rocephin, Zofran, morphine, and Dilaudid.    Assessment & Plan:   Principal Problem:   Closed right hip fracture (HCC) Active Problems:   Controlled type 2  diabetes mellitus without complication, without long-term current use of insulin (HCC)   Elevated troponin   Essential hypertension   Stage 3a chronic kidney disease (CKD) (HCC)   Acute cystitis without hematuria   Acute urinary retention   Assessment and Plan: * Closed right hip fracture (HCC) CT with acute transverse oblique subcapital proximal R femoral fx, impacted and otherwise not displaced - sclerotic and cystic changes in posterior femoral head c/w chronic osteonecrosis, small R hip joint hemarthrossis, postraumatic subcutaneous stranding S/p R total hip arthroplasty, anterior approach 5/21 dvt ppx with aspirin 325 per ortho  Pain management per ortho Bowel regimen PT/OT, follow recs post op  Elevated troponin Asymptomatic Mildly elevated and flat EKG with q's in III and aVF No additional w/u unless she has sx    Controlled type 2 diabetes mellitus without complication, without long-term current use of insulin (HCC) a1c 6.2 SSI  Essential hypertension amlodipine  Stage 3a chronic kidney disease (CKD) ([place]) noted  Acute urinary retention Foley placed 5/20, likely related to UTI and hip fracture/opiates/immobility  Will plan for trial of void  Acute cystitis without hematuria Follow cultures -> e. Coli sensitive to cefazolin Continue treatment for now     DVT prophylaxis: SCD Code Status: DNR Family Communication: none Disposition:   Status is: Inpatient Remains inpatient appropriate because: need for orthopedics   Consultants:  ortho  Procedures:  Right total hip arthroplasty, anterior approach 5/21  Antimicrobials:  Anti-infectives (From admission, onward)  Start     Dose/Rate Route Frequency Ordered Stop   12/17/21 1000  cefadroxil (DURICEF) capsule 500 mg        500 mg Oral 2 times daily 12/17/21 0846 12/21/21 0959   12/16/21 1645  ceFAZolin (ANCEF) IVPB 2g/100 mL premix        2 g 200 mL/hr over 30 Minutes Intravenous Every 6 hours  12/16/21 1256 12/16/21 2307   12/16/21 1015  ceFAZolin (ANCEF) IVPB 2g/100 mL premix        2 g 200 mL/hr over 30 Minutes Intravenous On call to O.R. 12/16/21 0277 12/16/21 1116   12/16/21 0928  ceFAZolin (ANCEF) 2-4 GM/100ML-% IVPB       Note to Pharmacy: Dara Lords M: cabinet override      12/16/21 0928 12/16/21 1110   12/16/21 0100  cefTRIAXone (ROCEPHIN) 1 g in sodium chloride 0.9 % 100 mL IVPB  Status:  Discontinued        1 g 200 mL/hr over 30 Minutes Intravenous Every 24 hours 12/15/21 0617 12/17/21 0846   12/15/21 0015  cefTRIAXone (ROCEPHIN) 1 g in sodium chloride 0.9 % 100 mL IVPB        1 g 200 mL/hr over 30 Minutes Intravenous  Once 12/15/21 0002 12/15/21 0124       Subjective: C/o some pain, but overall things are better  Objective: Vitals:   12/16/21 1303 12/16/21 2010 12/17/21 0000 12/17/21 0522  BP: 132/88 127/75 129/62 121/68  Pulse: 83 85 92 83  Resp: '16 16 16 17  '$ Temp: 98.2 F (36.8 C) 98.4 F (36.9 C) 98.2 F (36.8 C) 98.2 F (36.8 C)  TempSrc: Oral Oral Oral Oral  SpO2: 97% 92% 95% 100%  Weight:      Height:        Intake/Output Summary (Last 24 hours) at 12/17/2021 0846 Last data filed at 12/17/2021 0600 Gross per 24 hour  Intake 2543.89 ml  Output 1500 ml  Net 1043.89 ml   Filed Weights   12/14/21 2155  Weight: 53.5 kg    Examination:  General: No acute distress. Cardiovascular: RRR Lungs: unlabored Abdomen: Soft, nontender, nondistended  Neurological: Alert and oriented 3. Moves all extremities 4 with equal strength. Cranial nerves II through XII grossly intact. Extremities: RLE intact dressing     Data Reviewed: I have personally reviewed following labs and imaging studies  CBC: Recent Labs  Lab 12/14/21 2209 12/16/21 0253 12/17/21 0739  WBC 9.9 8.1 12.5*  NEUTROABS 6.9  --  9.4*  HGB 14.8 12.6 10.0*  HCT 42.5 38.2 29.8*  MCV 88.7 90.5 91.1  PLT 225 263 412    Basic Metabolic Panel: Recent Labs  Lab  12/14/21 2314 12/16/21 0253  NA 142 143  K 3.7 3.8  CL 110 109  CO2 26 28  GLUCOSE 146* 99  BUN 24* 23  CREATININE 0.99 0.67  CALCIUM 8.6* 8.6*    GFR: Estimated Creatinine Clearance: 35.3 mL/min (by C-G formula based on SCr of 0.67 mg/dL).  Liver Function Tests: No results for input(s): AST, ALT, ALKPHOS, BILITOT, PROT, ALBUMIN in the last 168 hours.  CBG: Recent Labs  Lab 12/16/21 1646 12/16/21 2006 12/17/21 0003 12/17/21 0524 12/17/21 0725  GLUCAP 250* 205* 148* 134* 108*     Recent Results (from the past 240 hour(s))  Urine Culture     Status: Abnormal   Collection Time: 12/14/21  7:13 AM   Specimen: Urine, Clean Catch  Result Value Ref Range Status  Specimen Description   Final    URINE, CLEAN CATCH Performed at [facility], [place] [address], [place], [place] [address]    Special Requests   Final    NONE Performed at [facility], [place] [address] [place], [place] [address]    Culture >=100,000 COLONIES/mL ESCHERICHIA COLI (Bonnie Roig)  Final   Report Status 12/17/2021 FINAL  Final   Organism ID, Bacteria ESCHERICHIA COLI (Jazmin Ley)  Final      Susceptibility   Escherichia coli - MIC*    AMPICILLIN >=32 RESISTANT Resistant     CEFAZOLIN <=4 SENSITIVE Sensitive     CEFEPIME <=0.12 SENSITIVE Sensitive     CEFTRIAXONE <=0.25 SENSITIVE Sensitive     CIPROFLOXACIN <=0.25 SENSITIVE Sensitive     GENTAMICIN >=16 RESISTANT Resistant     IMIPENEM <=0.25 SENSITIVE Sensitive     NITROFURANTOIN <=16 SENSITIVE Sensitive     TRIMETH/SULFA >=320 RESISTANT Resistant     AMPICILLIN/SULBACTAM 16 INTERMEDIATE Intermediate     PIP/TAZO <=4 SENSITIVE Sensitive     * >=100,000 COLONIES/mL ESCHERICHIA COLI  Surgical pcr screen     Status: Abnormal   Collection Time: 12/16/21  5:01 AM   Specimen: Nasal Mucosa; Nasal Swab  Result Value Ref Range Status   MRSA, PCR NEGATIVE NEGATIVE Final   Staphylococcus aureus POSITIVE (Addilynn Mowrer) NEGATIVE Final     Comment: (NOTE) The Xpert SA Assay (FDA approved for NASAL specimens in patients 18 years of age and older), is one component of [place] comprehensive surveillance program. It is not intended to diagnose infection nor to guide or monitor treatment. Performed at [facility], [place] [address] [place], [place] [address]          Radiology Studies: Pelvis Portable  Result Date: 12/16/2021 CLINICAL DATA:  Status post RIGHT total hip arthroplasty. EXAM: PORTABLE PELVIS 1 VIEWS COMPARISON:  Prior studies FINDINGS: RIGHT total hip arthroplasty noted without definite complicating features. No other acute bony abnormalities are noted. IMPRESSION: RIGHT total hip arthroplasty without definite complicating features. Electronically Signed   By: Margarette Canada M.D.   On: 12/16/2021 12:39   DG C-Arm 1-60 Min-No Report  Result Date: 12/16/2021 Fluoroscopy was utilized by the requesting physician.  No radiographic interpretation.   DG HIP UNILAT WITH PELVIS 1V RIGHT  Result Date: 12/16/2021 CLINICAL DATA:  RIGHT total hip arthroplasty EXAM: DG HIP (WITH OR WITHOUT PELVIS) 2V RIGHT COMPARISON:  Prior studies FINDINGS: Intraoperative spot views of the RIGHT hip are submitted postoperatively for interpretation. RIGHT total hip arthroplasty changes noted without definite complicating features, although on the final film, portion of the hardware and femur are off the field of view. IMPRESSION: RIGHT total hip arthroplasty as described. Electronically Signed   By: Margarette Canada M.D.   On: 12/16/2021 12:41        Scheduled Meds:  amLODipine  5 mg Oral Daily   aspirin EC  325 mg Oral Q breakfast   cefadroxil  500 mg Oral BID   docusate sodium  100 mg Oral BID   feeding supplement  237 mL Oral BID BM   fluticasone  2 spray Each Nare Daily   insulin aspart  0-6 Units Subcutaneous Q4H   multivitamin with minerals  1 tablet Oral Daily   polyethylene glycol  17 g Oral Daily   Continuous  Infusions:  sodium chloride 50 mL/hr at 12/16/21 1726   methocarbamol (ROBAXIN) IV       LOS: 2 days  Time spent: over 30 min    Fayrene Helper, MD [facility]   To contact the attending provider between 7A-7P or the covering provider during after hours 7P-7A, please log into the web site [email or web address] and access using universal  password for that web site. If you do not have the password, please call the hospital operator.  12/17/2021, 8:46 AM

## 2021-12-17 NOTE — Assessment & Plan Note (Signed)
Trend PO iron

## 2021-12-17 NOTE — NC FL2 (Signed)
[place] MEDICAID FL2 LEVEL OF CARE SCREENING TOOL     IDENTIFICATION  Patient Name: Alexa [name] Birthdate: February 21, 1930 Sex: female Admission Date (Current Location): 12/14/2021  [facility] and [place] Number:  Herbalist and Address:  [facility],  [place], [place]      Provider Number: [id number]  Attending Physician Name and Address:  Elodia Florence., *  Relative Name and Phone Number:  son, Chiquetta Langner [phone]    Current Level of Care: Hospital Recommended Level of Care: [place] Prior Approval Number:    Date Approved/Denied:   PASRR Number: [id number]  Discharge Plan: SNF    Current Diagnoses: Patient Active Problem List   Diagnosis Date Noted   Acute postoperative anemia due to expected blood loss 12/17/2021   Closed right hip fracture ([place]) 12/15/2021   Stage 3a chronic kidney disease (CKD) ([place]) 12/15/2021   Acute urinary retention 12/15/2021   Acute cystitis without hematuria    Elevated troponin    Allergy to alpha-gal 12/18/2020   Other adverse food reactions, not elsewhere classified, subsequent encounter 12/18/2020   Rash and other nonspecific skin eruption 11/21/2020   Seasonal and perennial allergic rhinitis 11/21/2020   Solitary kidney 12/15/2018   Cough variant asthma 12/29/2014   Spinal stenosis in cervical region 12/13/2014   Radiculopathy, lumbar region 12/13/2014   Depression 05/03/2008   PERONEAL NEUROPATHY 05/03/2008   Malignant neoplasm of kidney excluding renal pelvis ([place]) 08/06/2007   Controlled type 2 diabetes mellitus without complication, without long-term current use of insulin ([place]) 08/06/2007   GERD 08/06/2007   PULMONARY EMBOLISM, HX OF 08/06/2007   Hyperlipidemia 09/03/2006   Essential hypertension 09/03/2006   IBS 09/03/2006    Orientation RESPIRATION BLADDER Height & Weight     Self, Time, Situation, Place  Normal Continent Weight: 118 lb (53.5  kg) Height:  '5\' 1"'$  (154.9 cm)  BEHAVIORAL SYMPTOMS/MOOD NEUROLOGICAL BOWEL NUTRITION STATUS      Continent Diet (regular)  AMBULATORY STATUS COMMUNICATION OF NEEDS Skin   Limited Assist Verbally Other (Comment) (surgical incision only)                       Personal Care Assistance Level of Assistance  Bathing, Dressing Bathing Assistance: Limited assistance   Dressing Assistance: Limited assistance     Functional Limitations Info             [place]  PT (By licensed PT), OT (By licensed OT)     PT Frequency: 5x/wk OT Frequency: 5x/wk            Contractures Contractures Info: Not present    Additional Factors Info  Code Status, Allergies, Insulin Sliding Scale Code Status Info: DNR Allergies Info: Lisinopril, Codeine, Verapamil   Insulin Sliding Scale Info: see MAR       Current Medications (12/17/2021):  This is the current hospital active medication list Current Facility-Administered Medications  Medication Dose Route Frequency Provider Last Rate Last Admin   acetaminophen (TYLENOL) tablet 325-650 mg  325-650 mg Oral Q6H PRN Swinteck, Aaron Edelman, MD       amLODipine (NORVASC) tablet 5 mg  5 mg Oral Daily Swinteck, Brian, MD   5 mg at 12/17/21 1003   apixaban (ELIQUIS) tablet 2.5 mg  2.5 mg Oral BID Larene Pickett S, PA-C       cefadroxil (DURICEF) capsule 500 mg  500 mg Oral BID Elodia Florence., MD  500 mg at 12/17/21 1003   cyclobenzaprine (FLEXERIL) tablet 5 mg  5 mg Oral TID PRN Rod Can, MD   5 mg at 12/15/21 1734   docusate sodium (COLACE) capsule 100 mg  100 mg Oral BID Rod Can, MD   100 mg at 12/17/21 1003   feeding supplement (ENSURE ENLIVE / ENSURE PLUS) liquid 237 mL  237 mL Oral BID BM Swinteck, Aaron Edelman, MD   237 mL at 12/15/21 1558   [START ON 12/18/2021] ferrous sulfate tablet 325 mg  325 mg Oral Q breakfast Elodia Florence., MD       fluticasone Asencion Islam) 50 MCG/ACT nasal spray 2 spray  2 spray Each  Nare Daily Swinteck, Aaron Edelman, MD   2 spray at 12/16/21 0930   HYDROcodone-acetaminophen (NORCO) 7.5-325 MG per tablet 1-2 tablet  1-2 tablet Oral Q4H PRN Rod Can, MD   1 tablet at 12/16/21 2238   HYDROcodone-acetaminophen (NORCO/VICODIN) 5-325 MG per tablet 1-2 tablet  1-2 tablet Oral Q4H PRN Rod Can, MD       HYDROmorphone (DILAUDID) injection 0.5 mg  0.5 mg Intravenous Q3H PRN Rod Can, MD   0.5 mg at 12/17/21 0902   insulin aspart (novoLOG) injection 0-5 Units  0-5 Units Subcutaneous QHS Elodia Florence., MD       insulin aspart (novoLOG) injection 0-9 Units  0-9 Units Subcutaneous TID WC Elodia Florence., MD       menthol-cetylpyridinium (CEPACOL) lozenge 3 mg  1 lozenge Oral PRN Rod Can, MD       Or   phenol (CHLORASEPTIC) mouth spray 1 spray  1 spray Mouth/Throat PRN Swinteck, Aaron Edelman, MD       methocarbamol (ROBAXIN) tablet 500 mg  500 mg Oral Q6H PRN Rod Can, MD   500 mg at 12/17/21 5885   Or   methocarbamol (ROBAXIN) 500 mg in dextrose 5 % 50 mL IVPB  500 mg Intravenous Q6H PRN Swinteck, Aaron Edelman, MD       metoCLOPramide (REGLAN) tablet 5-10 mg  5-10 mg Oral Q8H PRN Swinteck, Aaron Edelman, MD       Or   metoCLOPramide (REGLAN) injection 5-10 mg  5-10 mg Intravenous Q8H PRN Swinteck, Aaron Edelman, MD       multivitamin with minerals tablet 1 tablet  1 tablet Oral Daily Swinteck, Aaron Edelman, MD   1 tablet at 12/17/21 1003   ondansetron (ZOFRAN) tablet 4 mg  4 mg Oral Q6H PRN Swinteck, Aaron Edelman, MD       Or   ondansetron (ZOFRAN) injection 4 mg  4 mg Intravenous Q6H PRN Swinteck, Aaron Edelman, MD       polyethylene glycol (MIRALAX / GLYCOLAX) packet 17 g  17 g Oral Daily Rod Can, MD   17 g at 12/17/21 1003     Discharge Medications: Please see discharge summary for a list of discharge medications.  Relevant Imaging Results:  Relevant Lab Results:   Additional Information SS# [id number]  Lennart Pall, LCSW

## 2021-12-17 NOTE — Evaluation (Signed)
Physical Therapy Evaluation Patient Details Name: Alexa [name] MRN: [id number] DOB: [birth year] Today's Date: 12/17/2021  History of Present Illness  88 y.o. female with medical history significant for hypertension, type 2 diabetes mellitus, renal carcinoma status post left nephrectomy in 2008, and mild renal insufficiency, who presents to the emergency department with severe right hip pain after fall at home. ortho consulted. pt with R femoral neck fx. s/p R  DA THA on 12/16/21.  Clinical Impression  Pt is s/p THA resulting in the deficits listed below (see PT Problem List).  Pt with mobility as noted below, overall mod to max assist with basic functional mobility tasks.  Pt  reports mod I at her baseline. will need SNF post acute   Pt will benefit from skilled PT to increase their independence and safety with mobility to allow discharge to the venue listed below.         Recommendations for follow up therapy are one component of a multi-disciplinary discharge planning process, led by the attending physician.  Recommendations may be updated based on patient status, additional functional criteria and insurance authorization.  Follow Up Recommendations Skilled nursing-short term rehab (<3 hours/day)    Assistance Recommended at Discharge Frequent or constant Supervision/Assistance  Patient can return home with the following  A lot of help with walking and/or transfers;A lot of help with bathing/dressing/bathroom;Assist for transportation;Assistance with cooking/housework;Help with stairs or ramp for entrance    Equipment Recommendations None recommended by PT  Recommendations for Other Services       Functional Status Assessment Patient has had a recent decline in their functional status and demonstrates the ability to make significant improvements in function in a reasonable and predictable amount of time.     Precautions / Restrictions Precautions Precautions:  Fall Restrictions Weight Bearing Restrictions: No Other Position/Activity Restrictions: WBAT      Mobility  Bed Mobility Overal bed mobility: Needs Assistance Bed Mobility: Supine to Sit     Supine to sit: Mod assist, Max assist     General bed mobility comments: assist with RLE and to elevate trunk, incr time, cues for sequence and self assist    Transfers Overall transfer level: Needs assistance Equipment used: Rolling walker (2 wheels) Transfers: Sit to/from Stand, Bed to chair/wheelchair/BSC Sit to Stand: Mod assist   Step pivot transfers: Max assist       General transfer comment: cues for sequence, hand placement, assist to rise and transiton to RW; heavy max assist to stand and take small steps to pivot  to  chair, pt with incr hip/trunk flexion, chair brought to pt to prevent fall.    Ambulation/Gait               General Gait Details: unable  Stairs            Wheelchair Mobility    Modified Rankin (Stroke Patients Only)       Balance Overall balance assessment: Needs assistance, History of Falls Sitting-balance support: Feet supported, Single extremity supported Sitting balance-Leahy Scale: Fair     Standing balance support: Reliant on assistive device for balance, During functional activity, Bilateral upper extremity supported Standing balance-Leahy Scale: Zero                               Pertinent Vitals/Pain Pain Assessment Pain Assessment: Faces Faces Pain Scale: Hurts even more Pain Location: right hip Pain Descriptors / Indicators: Grimacing, Sore, Aching,  Burning Pain Intervention(s): Premedicated before session, Monitored during session, Limited activity within patient's tolerance, Repositioned    Home Living Family/patient expects to be discharged to:: Skilled nursing facility Living Arrangements: Alone Available Help at Discharge: Family Type of Home: House Home Access: Stairs to enter Entrance  Stairs-Rails: Right Entrance Stairs-Number of Steps: 5   Home Layout: One level Home Equipment: [place] - single point;Rollator (4 wheels) Additional Comments: drives    Prior Function Prior Level of Function : Independent/Modified Independent                     Hand Dominance        Extremity/Trunk Assessment   Upper Extremity Assessment Upper Extremity Assessment: Defer to OT evaluation    Lower Extremity Assessment Lower Extremity Assessment: RLE deficits/detail;LLE deficits/detail RLE Deficits / Details: ankle WFL, knee and hip strength and AROM ltd by pain RLE: Unable to fully assess due to pain LLE Deficits / Details: AROM grossly WFL, strength 3 to 3+/5, testing limtied by pain       Communication   Communication: No difficulties  Cognition Arousal/Alertness: Awake/alert Behavior During Therapy: WFL for tasks assessed/performed Overall Cognitive Status: Impaired/Different from baseline Area of Impairment: Attention, Following commands, Problem solving, Safety/judgement                   Current Attention Level: Sustained   Following Commands: Follows multi-step commands inconsistently, Follows one step commands with increased time Safety/Judgement: Decreased awareness of safety, Decreased awareness of deficits   Problem Solving: Difficulty sequencing, Requires verbal cues General Comments: tangential at times, requires redirection to task        General Comments      Exercises General Exercises - Lower Extremity Ankle Circles/Pumps: AROM, Both, 10 reps   Assessment/Plan    PT Assessment Patient needs continued PT services  PT Problem List Decreased strength;Decreased mobility;Decreased range of motion;Decreased activity tolerance;Decreased balance;Decreased knowledge of use of DME;Pain;Decreased safety awareness       PT Treatment Interventions DME instruction;Therapeutic exercise;Gait training;Balance training;Functional mobility  training;Therapeutic activities;Patient/family education    PT Goals (Current goals can be found in the Care Plan section)  Acute Rehab PT Goals Patient Stated Goal: back to working in the yard PT Goal Formulation: With patient Time For Goal Achievement: 12/31/21 Potential to Achieve Goals: Good    Frequency Min 3X/week     Co-evaluation               AM-PAC PT "6 Clicks" Mobility  Outcome Measure Help needed turning from your back to your side while in a flat bed without using bedrails?: A Lot Help needed moving from lying on your back to sitting on the side of a flat bed without using bedrails?: A Lot Help needed moving to and from a bed to a chair (including a wheelchair)?: Total Help needed standing up from a chair using your arms (e.g., wheelchair or bedside chair)?: Total Help needed to walk in hospital room?: Total Help needed climbing 3-5 steps with a railing? : Total 6 Click Score: 8    End of Session Equipment Utilized During Treatment: Gait belt Activity Tolerance: Patient limited by pain;Patient limited by fatigue Patient left: in chair;with call bell/phone within reach;with chair alarm set;with family/visitor present Nurse Communication: Mobility status PT Visit Diagnosis: Other abnormalities of gait and mobility (R26.89);Difficulty in walking, not elsewhere classified (R26.2)    Time: 2094-7096 PT Time Calculation (min) (ACUTE ONLY): 29 min   Charges:  PT Evaluation $PT Eval Low Complexity: 1 Low PT Treatments $Therapeutic Activity: 8-22 mins        Baxter Flattery, PT  Acute Rehab Dept [facility]) [phone] Pager [phone]  12/17/2021   [facility] 12/17/2021, 12:10 PM

## 2021-12-17 NOTE — TOC Initial Note (Addendum)
Transition of Care [facility]) - Initial/Assessment Note    Patient Details  Name: Alexa [name] MRN: [id number] Date of Birth: 28-Mar-1930  Transition of Care [facility]) CM/SW Contact:    Lennart Pall, LCSW Phone Number: 12/17/2021, 1:15 PM  Clinical Narrative:                 Met with pt and brother, Edd Arbour, today to review/ discuss dc planning needs.  Both agreeable with PT recommendations for SNF rehab prior to return home as pt living alone.  Pt does have local, supportive family but they cannot provide 24/7 care.  Reviewed SNF placement process and preferred facilities for pt.  Will begin SNF bed search and insurance authorization.    ADDENDUM:  SNF bed accepted at [facility].  Facility to start insurance auth.  MD/RN aware.  Expected Discharge Plan: Skilled Nursing Facility Barriers to Discharge: Insurance Authorization, SNF Pending bed offer   Patient Goals and CMS Choice Patient states their goals for this hospitalization and ongoing recovery are:: to return home following SNF rehab CMS Medicare.gov Compare Post Acute Care list provided to:: Patient Choice offered to / list presented to : Patient  Expected Discharge Plan and Services Expected Discharge Plan: [place] In-house Referral: Clinical Social Work   Post Acute Care Choice: [place] Living arrangements for the past 2 months: [place]                 DME Arranged: N/A DME Agency: NA                  Prior Living Arrangements/Services Living arrangements for the past 2 months: Single Family Home Lives with:: Self Patient language and need for interpreter reviewed:: Yes Do you feel safe going back to the place where you live?: Yes      Need for Family Participation in Patient Care: No (Comment) Care giver support system in place?: Yes (comment)   Criminal Activity/Legal Involvement Pertinent to Current Situation/Hospitalization: No - Comment as needed  Activities of  Daily Living   ADL Screening (condition at time of admission) Patient's cognitive ability adequate to safely complete daily activities?: Yes Is the patient deaf or have difficulty hearing?: No Does the patient have difficulty seeing, even when wearing glasses/contacts?: No Does the patient have difficulty concentrating, remembering, or making decisions?: No Patient able to express need for assistance with ADLs?: Yes Does the patient have difficulty dressing or bathing?: Yes Independently performs ADLs?: No Communication: Independent Dressing (OT): Needs assistance Is this a change from baseline?: Change from baseline, expected to last >3 days Grooming: Independent Feeding: Independent Bathing: Needs assistance Is this a change from baseline?: Change from baseline, expected to last >3 days Toileting: Needs assistance Is this a change from baseline?: Change from baseline, expected to last >3days In/Out Bed: Needs assistance Is this a change from baseline?: Change from baseline, expected to last >3 days Walks in Home: Needs assistance Is this a change from baseline?: Change from baseline, expected to last >3 days Does the patient have difficulty walking or climbing stairs?: Yes Weakness of Legs: Both Weakness of Arms/Hands: None  Permission Sought/Granted Permission sought to share information with : Family Supports, Chartered certified accountant granted to share information with : Yes, Verbal Permission Granted  Share Information with NAME: [name] [place]     Permission granted to share info w Relationship: son  Permission granted to share info w Contact Information: [phone]  Emotional Assessment Appearance:: Appears  stated age Attitude/Demeanor/Rapport: Gracious, Engaged Affect (typically observed): Accepting Orientation: : Oriented to Place, Oriented to Self, Oriented to  Time, Oriented to Situation Alcohol / Substance Use: Not Applicable Psych Involvement: No  (comment)  Admission diagnosis:  Closed fracture of right hip, initial encounter ([place]) [S72.001A] Closed right hip fracture, initial encounter ([name]) [S72.001A] Patient Active Problem List   Diagnosis Date Noted   Acute postoperative anemia due to expected blood loss 12/17/2021   Closed right hip fracture ([place]) 12/15/2021   Stage 3a chronic kidney disease (CKD) ([place]) 12/15/2021   Acute urinary retention 12/15/2021   Acute cystitis without hematuria    Elevated troponin    Allergy to alpha-gal 12/18/2020   Other adverse food reactions, not elsewhere classified, subsequent encounter 12/18/2020   Rash and other nonspecific skin eruption 11/21/2020   Seasonal and perennial allergic rhinitis 11/21/2020   Solitary kidney 12/15/2018   Cough variant asthma 12/29/2014   Spinal stenosis in cervical region 12/13/2014   Radiculopathy, lumbar region 12/13/2014   Depression 05/03/2008   PERONEAL NEUROPATHY 05/03/2008   Malignant neoplasm of kidney excluding renal pelvis ([place]) 08/06/2007   Controlled type 2 diabetes mellitus without complication, without long-term current use of insulin ([place]) 08/06/2007   GERD 08/06/2007   PULMONARY EMBOLISM, HX OF 08/06/2007   Hyperlipidemia 09/03/2006   Essential hypertension 09/03/2006   IBS 09/03/2006   PCP:  Sueanne Margarita, DO Pharmacy:   [facility] [place], [place] - [address] [place] [address] Phone: [phone] Fax: [phone]  [facility] [place], [address] [place] [address] Phone: [phone] Fax: [phone]  [facility] [place], [place] - [address] [place] [address] Phone: [phone] Fax: [phone]     Social Determinants of Health ([place]) Interventions    Readmission Risk Interventions    12/17/2021    1:08 PM   Readmission Risk Prevention Plan  Transportation Screening Complete  PCP or Specialist Appt within 5-7 Days Complete  Home Care Screening Complete  Medication Review (RN CM) Complete

## 2021-12-17 NOTE — Anesthesia Postprocedure Evaluation (Signed)
Anesthesia Post Note  Patient: Alexa [name]  Procedure(s) Performed: TOTAL HIP ARTHROPLASTY ANTERIOR APPROACH (Right: Hip)     Patient location during evaluation: PACU Anesthesia Type: General Level of consciousness: awake and alert Pain management: pain level controlled Vital Signs Assessment: post-procedure vital signs reviewed and stable Respiratory status: spontaneous breathing, nonlabored ventilation, respiratory function stable and patient connected to nasal cannula oxygen Cardiovascular status: blood pressure returned to baseline and stable Postop Assessment: no apparent nausea or vomiting Anesthetic complications: no   No notable events documented.  Last Vitals:  Vitals:   12/17/21 0000 12/17/21 0522  BP: 129/62 121/68  Pulse: 92 83  Resp: 16 17  Temp: 36.8 C 36.8 C  SpO2: 95% 100%    Last Pain:  Vitals:   12/17/21 0522  TempSrc: Oral  PainSc:                  Tiajuana Amass

## 2021-12-18 DIAGNOSIS — S72001A Fracture of unspecified part of neck of right femur, initial encounter for closed fracture: Secondary | ICD-10-CM | POA: Diagnosis not present

## 2021-12-18 LAB — CBC WITH DIFFERENTIAL/PLATELET
Abs Immature Granulocytes: 0.05 10*3/uL (ref 0.00–0.07)
Basophils Absolute: 0.1 10*3/uL (ref 0.0–0.1)
Basophils Relative: 1 %
Eosinophils Absolute: 0.6 10*3/uL — ABNORMAL HIGH (ref 0.0–0.5)
Eosinophils Relative: 7 %
HCT: 29.3 % — ABNORMAL LOW (ref 36.0–46.0)
Hemoglobin: 9.7 g/dL — ABNORMAL LOW (ref 12.0–15.0)
Immature Granulocytes: 1 %
Lymphocytes Relative: 18 %
Lymphs Abs: 1.6 10*3/uL (ref 0.7–4.0)
MCH: 30.1 pg (ref 26.0–34.0)
MCHC: 33.1 g/dL (ref 30.0–36.0)
MCV: 91 fL (ref 80.0–100.0)
Monocytes Absolute: 0.8 10*3/uL (ref 0.1–1.0)
Monocytes Relative: 9 %
Neutro Abs: 5.7 10*3/uL (ref 1.7–7.7)
Neutrophils Relative %: 64 %
Platelets: 226 10*3/uL (ref 150–400)
RBC: 3.22 MIL/uL — ABNORMAL LOW (ref 3.87–5.11)
RDW: 13.8 % (ref 11.5–15.5)
WBC: 8.8 10*3/uL (ref 4.0–10.5)
nRBC: 0 % (ref 0.0–0.2)

## 2021-12-18 LAB — COMPREHENSIVE METABOLIC PANEL
ALT: 8 U/L (ref 0–44)
AST: 25 U/L (ref 15–41)
Albumin: 2.7 g/dL — ABNORMAL LOW (ref 3.5–5.0)
Alkaline Phosphatase: 40 U/L (ref 38–126)
Anion gap: 6 (ref 5–15)
BUN: 24 mg/dL — ABNORMAL HIGH (ref 8–23)
CO2: 26 mmol/L (ref 22–32)
Calcium: 8 mg/dL — ABNORMAL LOW (ref 8.9–10.3)
Chloride: 108 mmol/L (ref 98–111)
Creatinine, Ser: 0.9 mg/dL (ref 0.44–1.00)
GFR, Estimated: >60 mL/min (ref >60)
Glucose, Bld: 106 mg/dL — ABNORMAL HIGH (ref 70–99)
Potassium: 4.2 mmol/L (ref 3.5–5.1)
Sodium: 140 mmol/L (ref 135–145)
Total Bilirubin: 1 mg/dL (ref 0.3–1.2)
Total Protein: 5.3 g/dL — ABNORMAL LOW (ref 6.5–8.1)

## 2021-12-18 LAB — GLUCOSE, CAPILLARY
Glucose-Capillary: 102 mg/dL — ABNORMAL HIGH (ref 70–99)
Glucose-Capillary: 175 mg/dL — ABNORMAL HIGH (ref 70–99)
Glucose-Capillary: 149 mg/dL — ABNORMAL HIGH (ref 70–99)
Glucose-Capillary: 124 mg/dL — ABNORMAL HIGH (ref 70–99)

## 2021-12-18 LAB — MAGNESIUM: Magnesium: 2.2 mg/dL (ref 1.7–2.4)

## 2021-12-18 LAB — PHOSPHORUS: Phosphorus: 3.4 mg/dL (ref 2.5–4.6)

## 2021-12-18 MED ORDER — ACETAMINOPHEN 325 MG PO TABS: 650.0000 mg | ORAL_TABLET | Freq: Four times a day (QID) | ORAL | Status: DC | PRN

## 2021-12-18 MED ORDER — MUPIROCIN 2 % EX OINT
1.0000 "application " | TOPICAL_OINTMENT | Freq: Two times a day (BID) | CUTANEOUS | Status: DC
  Administered 2021-12-18 – 2021-12-19 (×3): 1 via NASAL
  Filled 2021-12-18: qty 22

## 2021-12-18 MED ORDER — ACETAMINOPHEN 500 MG PO TABS
1000.0000 mg | ORAL_TABLET | Freq: Three times a day (TID) | ORAL | Status: DC
Start: 2021-12-18 — End: 2021-12-20
  Administered 2021-12-18 – 2021-12-19 (×5): 1000 mg via ORAL
  Filled 2021-12-18 (×5): qty 2

## 2021-12-18 MED ORDER — CHLORHEXIDINE GLUCONATE CLOTH 2 % EX PADS
6.0000 | MEDICATED_PAD | Freq: Every day | CUTANEOUS | Status: DC
  Administered 2021-12-18 – 2021-12-19 (×2): 6 via TOPICAL

## 2021-12-18 MED ORDER — OXYCODONE HCL 5 MG PO TABS
5.0000 mg | ORAL_TABLET | ORAL | Status: DC | PRN
  Administered 2021-12-18 – 2021-12-19 (×6): 5 mg via ORAL
  Filled 2021-12-18 (×6): qty 1

## 2021-12-18 NOTE — Progress Notes (Signed)
PROGRESS NOTE    Alexa [name]  AOZ:[id number] DOB: 25-Jan-1930 DOA: 12/14/2021 PCP: Sueanne Margarita, DO  Chief Complaint  Patient presents with   Fall    Brief Narrative:  Alexa [name] is Alexa [name] pleasant 88 y.o. female with medical history significant for hypertension, type 2 diabetes mellitus, renal carcinoma status post left nephrectomy in 2008, and mild renal insufficiency, who presents to the emergency department with severe right hip pain after fall at home.  CT of the right hip demonstrates acute transverse oblique subcapital right femur fracture.  She's now s/p right total hip arthroplasty.   Awaiting SNF.  See below for additional details    Assessment & Plan:   Principal Problem:   Closed right hip fracture ([place]) Active Problems:   Controlled type 2 diabetes mellitus without complication, without long-term current use of insulin (HCC)   Elevated troponin   Essential hypertension   Stage 3a chronic kidney disease (CKD) (HCC)   Acute cystitis without hematuria   Acute urinary retention   Acute postoperative anemia due to expected blood loss   Assessment and Plan: * Closed right hip fracture (HCC) CT with acute transverse oblique subcapital proximal R femoral fx, impacted and otherwise not displaced - sclerotic and cystic changes in posterior femoral head c/w chronic osteonecrosis, small R hip joint hemarthrossis, postraumatic subcutaneous stranding S/p R total hip arthroplasty, anterior approach 5/21 dvt ppx with eliquis per orthopedics Pain management per ortho Bowel regimen PT/OT, follow recs post op Awaiting placement  Elevated troponin Asymptomatic Mildly elevated and flat EKG with q's in III and aVF No additional w/u unless she has sx    Controlled type 2 diabetes mellitus without complication, without long-term current use of insulin (HCC) a1c 6.2 SSI  Essential hypertension amlodipine  Stage 3a chronic kidney disease (CKD) (HCC) noted  Acute  urinary retention Foley placed 5/20, likely related to UTI and hip fracture/opiates/immobility  Will plan for trial of void today  Acute cystitis without hematuria Follow cultures -> e. Coli sensitive to cefazolin Plan for 7 days  Acute postoperative anemia due to expected blood loss Trend PO iron     DVT prophylaxis: SCD Code Status: DNR Family Communication: none Disposition:   Status is: Inpatient Remains inpatient appropriate because: need for orthopedics   Consultants:  ortho  Procedures:  Right total hip arthroplasty, anterior approach 5/21  Antimicrobials:  Anti-infectives (From admission, onward)    Start     Dose/Rate Route Frequency Ordered Stop   12/17/21 1000  cefadroxil (DURICEF) capsule 500 mg        500 mg Oral 2 times daily 12/17/21 0846 12/21/21 0959   12/16/21 1645  ceFAZolin (ANCEF) IVPB 2g/100 mL premix        2 g 200 mL/hr over 30 Minutes Intravenous Every 6 hours 12/16/21 1256 12/16/21 2307   12/16/21 1015  ceFAZolin (ANCEF) IVPB 2g/100 mL premix        2 g 200 mL/hr over 30 Minutes Intravenous On call to O.R. 12/16/21 9629 12/16/21 1116   12/16/21 0928  ceFAZolin (ANCEF) 2-4 GM/100ML-% IVPB       Note to Pharmacy: Dara Lords M: cabinet override      12/16/21 0928 12/16/21 1110   12/16/21 0100  cefTRIAXone (ROCEPHIN) 1 g in sodium chloride 0.9 % 100 mL IVPB  Status:  Discontinued        1 g 200 mL/hr over 30 Minutes Intravenous Every 24 hours 12/15/21 0617 12/17/21 0846   12/15/21 0015  cefTRIAXone (ROCEPHIN) 1 g in sodium chloride 0.9 % 100 mL IVPB        1 g 200 mL/hr over 30 Minutes Intravenous  Once 12/15/21 0002 12/15/21 0124       Subjective: No new complaints  Objective: Vitals:   12/17/21 1845 12/17/21 2143 12/18/21 0452 12/18/21 1316  BP: 126/77 130/62 140/78 126/69  Pulse: 90 85 83 91  Resp: '16 18 18 18  '$ Temp: 99 F (37.2 C) 98.6 F (37 C) 98.9 F (37.2 C) 98.5 F (36.9 C)  TempSrc: Oral Oral Oral Oral  SpO2: 94%  94% 94% 95%  Weight:      Height:        Intake/Output Summary (Last 24 hours) at 12/18/2021 1427 Last data filed at 12/18/2021 1003 Gross per 24 hour  Intake 450 ml  Output 1770 ml  Net -1320 ml   Filed Weights   12/14/21 2155  Weight: 53.5 kg    Examination:  General: No acute distress. Cardiovascular: RRR Lungs: unlabored Abdomen: Soft, nontender, nondistended Neurological: Alert and oriented 3. Moves all extremities 4 with equal strength. Cranial nerves II through XII grossly intact. Extremities: RLE with intact dressing    Data Reviewed: I have personally reviewed following labs and imaging studies  CBC: Recent Labs  Lab 12/14/21 2209 12/16/21 0253 12/17/21 0739 12/18/21 0258  WBC 9.9 8.1 12.5* 8.8  NEUTROABS 6.9  --  9.4* 5.7  HGB 14.8 12.6 10.0* 9.7*  HCT 42.5 38.2 29.8* 29.3*  MCV 88.7 90.5 91.1 91.0  PLT 225 263 206 588    Basic Metabolic Panel: Recent Labs  Lab 12/14/21 2314 12/16/21 0253 12/17/21 0739 12/18/21 0258  NA 142 143 140 140  K 3.7 3.8 4.1 4.2  CL 110 109 108 108  CO2 '26 28 27 26  '$ GLUCOSE 146* 99 118* 106*  BUN 24* 23 24* 24*  CREATININE 0.99 0.67 0.86 0.90  CALCIUM 8.6* 8.6* 8.3* 8.0*  MG  --   --   --  2.2  PHOS  --   --   --  3.4    GFR: Estimated Creatinine Clearance: 31.4 mL/min (by C-G formula based on SCr of 0.9 mg/dL).  Liver Function Tests: Recent Labs  Lab 12/17/21 0739 12/18/21 0258  AST 22 25  ALT 10 8  ALKPHOS 43 40  BILITOT 0.7 1.0  PROT 5.4* 5.3*  ALBUMIN 2.9* 2.7*    CBG: Recent Labs  Lab 12/17/21 1242 12/17/21 1716 12/17/21 2149 12/18/21 0718 12/18/21 1156  GLUCAP 138* 119* 159* 102* 175*     Recent Results (from the past 240 hour(s))  Urine Culture     Status: Abnormal   Collection Time: 12/14/21  7:13 AM   Specimen: Urine, Clean Catch  Result Value Ref Range Status   Specimen Description   Final    URINE, CLEAN CATCH Performed at [facility], [place] [address] [place], [place] [address]    Special Requests   Final    NONE Performed at [facility], [place] [address] [place], [place] [address]    Culture >=100,000 COLONIES/mL ESCHERICHIA COLI (Shenise [place])  Final   Report Status 12/17/2021 FINAL  Final   Organism ID, Bacteria ESCHERICHIA COLI (Makhari Dovidio)  Final      Susceptibility   Escherichia coli - MIC*    AMPICILLIN >=32 RESISTANT Resistant     CEFAZOLIN <=4 SENSITIVE Sensitive     CEFEPIME <=0.12 SENSITIVE Sensitive     CEFTRIAXONE <=0.25  SENSITIVE Sensitive     CIPROFLOXACIN <=0.25 SENSITIVE Sensitive     GENTAMICIN >=16 RESISTANT Resistant     IMIPENEM <=0.25 SENSITIVE Sensitive     NITROFURANTOIN <=16 SENSITIVE Sensitive     TRIMETH/SULFA >=320 RESISTANT Resistant     AMPICILLIN/SULBACTAM 16 INTERMEDIATE Intermediate     PIP/TAZO <=4 SENSITIVE Sensitive     * >=100,000 COLONIES/mL ESCHERICHIA COLI  Surgical pcr screen     Status: Abnormal   Collection Time: 12/16/21  5:01 AM   Specimen: Nasal Mucosa; Nasal Swab  Result Value Ref Range Status   MRSA, PCR NEGATIVE NEGATIVE Final   Staphylococcus aureus POSITIVE (Benoit Meech) NEGATIVE Final    Comment: (NOTE) The Xpert SA Assay (FDA approved for NASAL specimens in patients 8 years of age and older), is one component of Taneesha Edgin comprehensive surveillance program. It is not intended to diagnose infection nor to guide or monitor treatment. Performed at [facility], [place] [address], [place], [place] [address]          Radiology Studies: No results found.      Scheduled Meds:  acetaminophen  1,000 mg Oral Q8H   amLODipine  5 mg Oral Daily   apixaban  2.5 mg Oral BID   cefadroxil  500 mg Oral BID   docusate sodium  100 mg Oral BID   feeding supplement  237 mL Oral BID BM   ferrous sulfate  325 mg Oral Q breakfast   fluticasone  2 spray Each Nare Daily   insulin aspart  0-5 Units Subcutaneous QHS   insulin aspart  0-9 Units Subcutaneous TID WC   multivitamin  with minerals  1 tablet Oral Daily   polyethylene glycol  17 g Oral Daily   Continuous Infusions:  methocarbamol (ROBAXIN) IV       LOS: 3 days    Time spent: over 30 min    Fayrene Helper, MD [facility]   To contact the attending provider between 7A-7P or the covering provider during after hours 7P-7A, please log into the web site [email or web address] and access using universal [place] password for that web site. If you do not have the password, please call the hospital operator.  12/18/2021, 2:27 PM

## 2021-12-18 NOTE — TOC Progression Note (Signed)
Transition of Care [facility]) - Progression Note    Patient Details  Name: ADELIS DOCTER MRN: [id number] Date of Birth: 16-Sep-1929  [facility]) CM/SW Contact  Lennart Pall, LCSW Phone Number: 12/18/2021, 3:16 PM  Clinical Narrative:    Have received insurance authorization for SNF at [facility].  Have alerted MD/pt and brother, Edd Arbour who are aware and agreeable with plan to transfer to facility tomorrow via PTAR.     Expected Discharge Plan: Skilled Nursing Facility Barriers to Discharge: Ship broker, SNF Pending bed offer  Expected Discharge Plan and Services Expected Discharge Plan: [place] In-house Referral: Clinical Social Work   Post Acute Care Choice: [place] Living arrangements for the past 2 months: Single Family Home                 DME Arranged: N/A DME Agency: NA                   Social Determinants of Health (SDOH) Interventions    Readmission Risk Interventions    12/17/2021    1:08 PM  Readmission Risk Prevention Plan  Transportation Screening Complete  PCP or Specialist Appt within 5-7 Days Complete  Home Care Screening Complete  Medication Review (RN CM) Complete

## 2021-12-18 NOTE — Progress Notes (Signed)
Occupational Therapy Treatment Patient Details Name: Alexa [name] MRN: [id number] DOB: [birth year] Today's Date: 12/18/2021   History of present illness 88 y.o. female with medical history significant for hypertension, type 2 diabetes mellitus, renal carcinoma status post left nephrectomy in 2008, and mild renal insufficiency, who presents to the emergency department with severe right hip pain after fall at home. ortho consulted. pt with R femoral neck fx. s/p R  DA THA on 12/16/21.   OT comments  Patients session was limited by pain and patients poor safety awareness with attempts at transfers. Patient was noted to have sudden sitting back into recliner with physical assist to control landing into chair with no warning. Patient was educated various times about needing to have a warning and reaching back to control landing. Patient would verbalize understanding but was unable to implement into practice during this session. Patients poor standing tolerance, poor safety awareness and decreased strength impacted participation in ADLs. Nursing made aware of recommendations for transfers and patients poor safety awareness.    Recommendations for follow up therapy are one component of a multi-disciplinary discharge planning process, led by the attending physician.  Recommendations may be updated based on patient status, additional functional criteria and insurance authorization.    Follow Up Recommendations  Skilled nursing-short term rehab (<3 hours/day)    Assistance Recommended at Discharge Frequent or constant Supervision/Assistance  Patient can return home with the following  A lot of help with bathing/dressing/bathroom;Assistance with cooking/housework;Direct supervision/assist for financial management;Assist for transportation;Help with stairs or ramp for entrance;Direct supervision/assist for medications management;Two people to help with walking and/or transfers   Equipment Recommendations   None recommended by OT    Recommendations for Other Services      Precautions / Restrictions Precautions Precautions: Fall Restrictions Weight Bearing Restrictions: Yes Other Position/Activity Restrictions: WBAT       Mobility Bed Mobility                    Transfers                         Balance                                           ADL either performed or assessed with clinical judgement   ADL                                              Extremity/Trunk Assessment              Vision       Perception     Praxis      Cognition Arousal/Alertness: Awake/alert Behavior During Therapy: WFL for tasks assessed/performed Overall Cognitive Status: Impaired/Different from baseline Area of Impairment: Attention, Following commands, Problem solving, Safety/judgement                   Current Attention Level: Sustained   Following Commands: Follows multi-step commands inconsistently, Follows one step commands with increased time Safety/Judgement: Decreased awareness of safety, Decreased awareness of deficits   Problem Solving: Difficulty sequencing, Requires verbal cues General Comments: patient was noted to repeat herself multiple times during session with increased cues needed for safety. patient was unable to  carryover education from one minute to the next. nurse made aware.        Exercises Other Exercises Other Exercises: patients session focused on pre transfer techniques sit to stands 5 reps x2 sets with patient continuously reporting she wanted to practice this task 20 more times. patient had no carryover of education during session wtih patient noted to have multiple sitting prior to reaching back to chair reporting hip "just giving out". patient needed min A to mod A to soften landing into chair each time. patient needed increased cues to attend to tasks. Other Exercises: patient was  unable to lift BLE off the floor with patient barely able to advance LLE off floor to attempt transfers. patients NT was educated on using STEDY with patient to transfer. NT verbalized understanding.    Shoulder Instructions       General Comments      Pertinent Vitals/ Pain       Pain Assessment Pain Assessment: Faces Faces Pain Scale: Hurts whole lot Pain Location: R hip Pain Descriptors / Indicators: Grimacing, Sore, Aching, Burning Pain Intervention(s): Limited activity within patient's tolerance, Monitored during session, Premedicated before session, Repositioned  Home Living                                          Prior Functioning/Environment              Frequency  Min 2X/week        Progress Toward Goals  OT Goals(current goals can now be found in the care plan section)  Progress towards OT goals: Progressing toward goals     Plan Discharge plan remains appropriate    Co-evaluation                 AM-PAC OT "6 Clicks" Daily Activity     Outcome Measure   Help from another person eating meals?: A Little Help from another person taking care of personal grooming?: A Little Help from another person toileting, which includes using toliet, bedpan, or urinal?: Total Help from another person bathing (including washing, rinsing, drying)?: A Lot Help from another person to put on and taking off regular upper body clothing?: A Little Help from another person to put on and taking off regular lower body clothing?: A Lot 6 Click Score: 14    End of Session Equipment Utilized During Treatment: Gait belt;Rolling walker (2 wheels)  OT Visit Diagnosis: Unsteadiness on feet (R26.81);Pain;History of falling (Z91.81) Pain - Right/Left: Right Pain - part of body: Hip   Activity Tolerance Patient limited by pain   Patient Left in bed;with call bell/phone within reach;with bed alarm set   Nurse Communication Patient requests pain meds         Time: 2229-7989 OT Time Calculation (min): 23 min  Charges: OT General Charges $OT Visit: 1 Visit OT Treatments $Therapeutic Activity: 23-37 mins  Jackelyn Poling OTR/L, MS Acute Rehabilitation Department Office# [phone] Pager# [phone]   Marcellina Millin 12/18/2021, 1:13 PM

## 2021-12-18 NOTE — Progress Notes (Signed)
    Subjective:  Patient reports pain as mild to moderate.  Denies N/V/CP/SOB/Abd pain. Patient states she does have some pain in her thigh when trying to move her leg. Denies tingling and numbness in LE bilaterally.   Objective:   VITALS:   Vitals:   12/17/21 1443 12/17/21 1845 12/17/21 2143 12/18/21 0452  BP: 130/80 126/77 130/62 140/78  Pulse: 92 90 85 83  Resp: '16 16 18 18  '$ Temp: 98.9 F (37.2 C) 99 F (37.2 C) 98.6 F (37 C) 98.9 F (37.2 C)  TempSrc: Oral Oral Oral Oral  SpO2: 94% 94% 94% 94%  Weight:      Height:        Patient is lying in the bed. NAD. Neurologically intact ABD soft Neurovascular intact Sensation intact distally Intact pulses distally Dorsiflexion/Plantar flexion intact Incision: dressing C/D/I No cellulitis present Compartment soft   Lab Results  Component Value Date   WBC 8.8 12/18/2021   HGB 9.7 (L) 12/18/2021   HCT 29.3 (L) 12/18/2021   MCV 91.0 12/18/2021   PLT 226 12/18/2021   BMET    Component Value Date/Time   NA 140 12/18/2021 0258   K 4.2 12/18/2021 0258   CL 108 12/18/2021 0258   CO2 26 12/18/2021 0258   GLUCOSE 106 (H) 12/18/2021 0258   BUN 24 (H) 12/18/2021 0258   CREATININE 0.90 12/18/2021 0258   CREATININE 1.24 (H) 04/03/2018 1552   CALCIUM 8.0 (L) 12/18/2021 0258   GFRNONAA >60 12/18/2021 0258     Assessment/Plan: 2 Days Post-Op   Principal Problem:   Closed right hip fracture (HCC) Active Problems:   Controlled type 2 diabetes mellitus without complication, without long-term current use of insulin (HCC)   Essential hypertension   Stage 3a chronic kidney disease (CKD) (HCC)   Acute cystitis without hematuria   Elevated troponin   Acute urinary retention   Acute postoperative anemia due to expected blood loss  S/p right THA from right hip fracture.   Pain has been limiting PT participation. Spoke with patient about doing exercises in bed and isometric quad exercises to help strengthen her muscles.  Continue to work with PT and ambulate.   WBAT with walker DVT ppx: Eliquis 2.'5mg'$  BID, SCDs, TEDS PO pain control: Hydrocodone, methocarbamol. PT/OT: Patient's pain was limiting her participation with PT yesterday. Continue PT.  Dispo: SNF placement to [facility] per chart as patient lives alone. Hospitalist to decide when medically ready for d/c to SNF. Continue to work with PT. Pain medication and DVT ppx printed in chart.     Charlott Rakes, PA-C 12/18/2021, 7:10 AM  [facility]  [place] [address], [place], [place] [address] Phone: [phone] [email or web address] Facebook  [employer]

## 2021-12-18 NOTE — Care Management Important Message (Signed)
Important Message  Patient Details IM Letter placed in Patients room Name: Alexa [name] MRN: [id number] Date of Birth: 1930-05-08   Medicare Important Message Given:  Yes     Kerin Salen 12/18/2021, 3:07 PM

## 2021-12-19 ENCOUNTER — Encounter (HOSPITAL_COMMUNITY): Payer: Self-pay | Admitting: Orthopedic Surgery

## 2021-12-19 DIAGNOSIS — Z471 Aftercare following joint replacement surgery: Secondary | ICD-10-CM | POA: Diagnosis not present

## 2021-12-19 DIAGNOSIS — J45991 Cough variant asthma: Secondary | ICD-10-CM | POA: Diagnosis not present

## 2021-12-19 DIAGNOSIS — N1831 Chronic kidney disease, stage 3a: Secondary | ICD-10-CM | POA: Diagnosis not present

## 2021-12-19 DIAGNOSIS — Z91018 Allergy to other foods: Secondary | ICD-10-CM | POA: Diagnosis not present

## 2021-12-19 DIAGNOSIS — E785 Hyperlipidemia, unspecified: Secondary | ICD-10-CM | POA: Diagnosis not present

## 2021-12-19 DIAGNOSIS — S72001D Fracture of unspecified part of neck of right femur, subsequent encounter for closed fracture with routine healing: Secondary | ICD-10-CM | POA: Diagnosis not present

## 2021-12-19 DIAGNOSIS — E1122 Type 2 diabetes mellitus with diabetic chronic kidney disease: Secondary | ICD-10-CM | POA: Diagnosis not present

## 2021-12-19 DIAGNOSIS — I10 Essential (primary) hypertension: Secondary | ICD-10-CM | POA: Diagnosis not present

## 2021-12-19 DIAGNOSIS — K5900 Constipation, unspecified: Secondary | ICD-10-CM | POA: Diagnosis not present

## 2021-12-19 DIAGNOSIS — N3000 Acute cystitis without hematuria: Secondary | ICD-10-CM | POA: Diagnosis not present

## 2021-12-19 DIAGNOSIS — D62 Acute posthemorrhagic anemia: Secondary | ICD-10-CM

## 2021-12-19 DIAGNOSIS — M25551 Pain in right hip: Secondary | ICD-10-CM | POA: Diagnosis not present

## 2021-12-19 DIAGNOSIS — R21 Rash and other nonspecific skin eruption: Secondary | ICD-10-CM | POA: Diagnosis not present

## 2021-12-19 DIAGNOSIS — R300 Dysuria: Secondary | ICD-10-CM | POA: Diagnosis not present

## 2021-12-19 DIAGNOSIS — R338 Other retention of urine: Secondary | ICD-10-CM | POA: Diagnosis not present

## 2021-12-19 DIAGNOSIS — Z7401 Bed confinement status: Secondary | ICD-10-CM | POA: Diagnosis not present

## 2021-12-19 DIAGNOSIS — R531 Weakness: Secondary | ICD-10-CM | POA: Diagnosis not present

## 2021-12-19 DIAGNOSIS — C659 Malignant neoplasm of unspecified renal pelvis: Secondary | ICD-10-CM | POA: Diagnosis not present

## 2021-12-19 DIAGNOSIS — N390 Urinary tract infection, site not specified: Secondary | ICD-10-CM | POA: Diagnosis not present

## 2021-12-19 DIAGNOSIS — S72001A Fracture of unspecified part of neck of right femur, initial encounter for closed fracture: Secondary | ICD-10-CM | POA: Diagnosis not present

## 2021-12-19 LAB — COMPREHENSIVE METABOLIC PANEL
ALT: 12 U/L (ref 0–44)
AST: 31 U/L (ref 15–41)
Albumin: 3.2 g/dL — ABNORMAL LOW (ref 3.5–5.0)
Alkaline Phosphatase: 53 U/L (ref 38–126)
Anion gap: 8 (ref 5–15)
BUN: 24 mg/dL — ABNORMAL HIGH (ref 8–23)
CO2: 27 mmol/L (ref 22–32)
Calcium: 8.8 mg/dL — ABNORMAL LOW (ref 8.9–10.3)
Chloride: 106 mmol/L (ref 98–111)
Creatinine, Ser: 0.76 mg/dL (ref 0.44–1.00)
GFR, Estimated: >60 mL/min (ref >60)
Glucose, Bld: 148 mg/dL — ABNORMAL HIGH (ref 70–99)
Potassium: 4.3 mmol/L (ref 3.5–5.1)
Sodium: 141 mmol/L (ref 135–145)
Total Bilirubin: 0.9 mg/dL (ref 0.3–1.2)
Total Protein: 6.2 g/dL — ABNORMAL LOW (ref 6.5–8.1)

## 2021-12-19 LAB — CBC WITH DIFFERENTIAL/PLATELET
Abs Immature Granulocytes: 0.06 10*3/uL (ref 0.00–0.07)
Basophils Absolute: 0.1 10*3/uL (ref 0.0–0.1)
Basophils Relative: 1 %
Eosinophils Absolute: 0.5 10*3/uL (ref 0.0–0.5)
Eosinophils Relative: 7 %
HCT: 32.9 % — ABNORMAL LOW (ref 36.0–46.0)
Hemoglobin: 10.5 g/dL — ABNORMAL LOW (ref 12.0–15.0)
Immature Granulocytes: 1 %
Lymphocytes Relative: 21 %
Lymphs Abs: 1.6 10*3/uL (ref 0.7–4.0)
MCH: 29.6 pg (ref 26.0–34.0)
MCHC: 31.9 g/dL (ref 30.0–36.0)
MCV: 92.7 fL (ref 80.0–100.0)
Monocytes Absolute: 0.7 10*3/uL (ref 0.1–1.0)
Monocytes Relative: 9 %
Neutro Abs: 4.8 10*3/uL (ref 1.7–7.7)
Neutrophils Relative %: 61 %
Platelets: 269 10*3/uL (ref 150–400)
RBC: 3.55 MIL/uL — ABNORMAL LOW (ref 3.87–5.11)
RDW: 13.6 % (ref 11.5–15.5)
WBC: 7.8 10*3/uL (ref 4.0–10.5)
nRBC: 0 % (ref 0.0–0.2)

## 2021-12-19 LAB — GLUCOSE, CAPILLARY
Glucose-Capillary: 144 mg/dL — ABNORMAL HIGH (ref 70–99)
Glucose-Capillary: 170 mg/dL — ABNORMAL HIGH (ref 70–99)
Glucose-Capillary: 158 mg/dL — ABNORMAL HIGH (ref 70–99)

## 2021-12-19 LAB — MAGNESIUM: Magnesium: 2.4 mg/dL (ref 1.7–2.4)

## 2021-12-19 LAB — PHOSPHORUS: Phosphorus: 3.2 mg/dL (ref 2.5–4.6)

## 2021-12-19 MED ORDER — FERROUS SULFATE 325 (65 FE) MG PO TABS: 325.0000 mg | ORAL_TABLET | Freq: Every day | ORAL | 3 refills | Status: DC

## 2021-12-19 MED ORDER — POLYETHYLENE GLYCOL 3350 17 G PO PACK: 17.0000 g | PACK | Freq: Every day | ORAL | 0 refills | Status: DC

## 2021-12-19 MED ORDER — CEFADROXIL 500 MG PO CAPS: 500.0000 mg | ORAL_CAPSULE | Freq: Two times a day (BID) | ORAL | 0 refills | Status: AC

## 2021-12-19 MED ORDER — DOCUSATE SODIUM 100 MG PO CAPS: 100.0000 mg | ORAL_CAPSULE | Freq: Two times a day (BID) | ORAL | 0 refills | Status: DC

## 2021-12-19 NOTE — Progress Notes (Signed)
Physical Therapy Treatment Patient Details Name: Alexa [name] MRN: [id number] DOB: Mar 13, 1930 Today's Date: 12/19/2021   History of Present Illness 88 y.o. female with medical history significant for hypertension, type 2 diabetes mellitus, renal carcinoma status post left nephrectomy in 2008, and mild renal insufficiency, who presents to the emergency department with severe right hip pain after fall at home. ortho consulted. pt with R femoral neck fx. s/p R  DA THA on 12/16/21.    PT Comments    Pt continues to require significant assist for transfers, unable to extend hips/knees to WB through R or L LE.  Pt attributes this primarily d/t pain, however likely multiple contributing factors. Recommend SNF post acute   Recommendations for follow up therapy are one component of a multi-disciplinary discharge planning process, led by the attending physician.  Recommendations may be updated based on patient status, additional functional criteria and insurance authorization.  Follow Up Recommendations  Skilled nursing-short term rehab (<3 hours/day)     Assistance Recommended at Discharge Frequent or constant Supervision/Assistance  Patient can return home with the following Two people to help with walking and/or transfers;Two people to help with bathing/dressing/bathroom;Assist for transportation;Help with stairs or ramp for entrance;Direct supervision/assist for financial management;Direct supervision/assist for medications management;Assistance with cooking/housework   Equipment Recommendations  None recommended by PT    Recommendations for Other Services       Precautions / Restrictions Precautions Precautions: Fall Restrictions Weight Bearing Restrictions: No RLE Weight Bearing: Weight bearing as tolerated     Mobility  Bed Mobility Overal bed mobility: Needs Assistance Bed Mobility: Sit to Supine       Sit to supine: Max assist, Mod assist, +2 for physical assistance, +2 for  safety/equipment   General bed mobility comments: assist with control of  trunk descent and LEs on to bed    Transfers     Transfers: Bed to chair/wheelchair/BSC       Squat pivot transfers: Total assist, +2 physical assistance, +2 safety/equipment     General transfer comment: pt with knees and hips excessively flexed, unable to extend right or left LE, unable to WB through LEs. 2 assist with anterior-superior wt shift and  to pivot BSC to bed    Ambulation/Gait                   Stairs             Wheelchair Mobility    Modified Rankin (Stroke Patients Only)       Balance Overall balance assessment: Needs assistance, History of Falls Sitting-balance support: Feet supported, Single extremity supported Sitting balance-Leahy Scale: Fair       Standing balance-Leahy Scale: Zero                              Cognition Arousal/Alertness: Awake/alert Behavior During Therapy: WFL for tasks assessed/performed Overall Cognitive Status: Impaired/Different from baseline Area of Impairment: Attention, Following commands, Problem solving, Safety/judgement                   Current Attention Level: Sustained   Following Commands: Follows multi-step commands inconsistently, Follows one step commands with increased time Safety/Judgement: Decreased awareness of safety, Decreased awareness of deficits   Problem Solving: Difficulty sequencing, Requires verbal cues          Exercises General Exercises - Lower Extremity Ankle Circles/Pumps: AROM, Both, 10 reps Heel Slides: AAROM, Right, Limitations Heel Slides  Limitations: 3 reps, unable to continue d/t c/o spasms    General Comments        Pertinent Vitals/Pain Pain Assessment Pain Assessment: Faces Faces Pain Scale: Hurts whole lot Pain Location: R hip Pain Descriptors / Indicators: Grimacing, Sore, Aching, Burning Pain Intervention(s): Limited activity within patient's tolerance,  Monitored during session, Repositioned    Home Living                          Prior Function            PT Goals (current goals can now be found in the care plan section) Acute Rehab PT Goals Patient Stated Goal: back to working in the yard PT Goal Formulation: With patient Time For Goal Achievement: 12/31/21 Potential to Achieve Goals: Good Progress towards PT goals: Progressing toward goals    Frequency    Min 3X/week      PT Plan Current plan remains appropriate    Co-evaluation              AM-PAC PT "6 Clicks" Mobility   Outcome Measure  Help needed turning from your back to your side while in a flat bed without using bedrails?: Total Help needed moving from lying on your back to sitting on the side of a flat bed without using bedrails?: Total Help needed moving to and from a bed to a chair (including a wheelchair)?: Total Help needed standing up from a chair using your arms (e.g., wheelchair or bedside chair)?: Total Help needed to walk in hospital room?: Total Help needed climbing 3-5 steps with a railing? : Total 6 Click Score: 6    End of Session Equipment Utilized During Treatment: Gait belt Activity Tolerance: Patient limited by fatigue;Patient limited by pain Patient left: in bed;with call bell/phone within reach;with bed alarm set Nurse Communication: Mobility status PT Visit Diagnosis: Other abnormalities of gait and mobility (R26.89);Difficulty in walking, not elsewhere classified (R26.2)     Time: 1540-0867 PT Time Calculation (min) (ACUTE ONLY): 17 min  Charges:  $Therapeutic Activity: 8-22 mins                     Baxter Flattery, PT  Acute Rehab Dept ([place]) [phone] Pager [phone]  12/19/2021    [facility] 12/19/2021, 3:44 PM

## 2021-12-19 NOTE — Plan of Care (Signed)

## 2021-12-19 NOTE — Progress Notes (Addendum)
Attempted to give report, reached the voicemail, left callback number.  16:03 Called facility, gave report to Otila Kluver, all questions answered.  Pt not in acute distress, to discharge to SNF with belongings via [place].

## 2021-12-19 NOTE — Discharge Summary (Signed)
Physician Discharge Summary  Alexa [name] XHB:[id number] DOB: [birth year] DOA: 12/14/2021  PCP: Sueanne Margarita, DO  Admit date: 12/14/2021 Discharge date: 12/19/2021  Admitted From: home Disposition:  SNF  Recommendations for Outpatient Follow-up:  Follow up with PCP in 1-2 weeks Please obtain BMP/CBC in one week Follow up with orthopedics in 2 weeks   Discharge Condition:stable CODE STATUS:DNR Diet recommendation: regular diet  Brief/Interim Summary: Alexa [name] is a pleasant 88 y.o. female with medical history significant for hypertension, type 2 diabetes mellitus, renal carcinoma status post left nephrectomy in 2008, and mild renal insufficiency, who presents to the emergency department with severe right hip pain after fall at home.  CT of the right hip demonstrates acute transverse oblique subcapital right femur fracture.  She's now s/p right total hip arthroplasty.   Discharge Diagnoses:  Principal Problem:   Closed right hip fracture ([place]) Active Problems:   Controlled type 2 diabetes mellitus without complication, without long-term current use of insulin (HCC)   Elevated troponin   Essential hypertension   Stage 3a chronic kidney disease (CKD) (HCC)   Acute cystitis without hematuria   Acute urinary retention   Acute postoperative anemia due to expected blood loss  Closed right hip fracture (HCC) CT with acute transverse oblique subcapital proximal R femoral fx, impacted and otherwise not displaced - sclerotic and cystic changes in posterior femoral head c/w chronic osteonecrosis, small R hip joint hemarthrossis, postraumatic subcutaneous stranding S/p R total hip arthroplasty, anterior approach 5/21 dvt ppx with eliquis per orthopedics Pain management per ortho Bowel regimen PT/OT, recommendations for SNF WBAT  Follow up with ortho in 2 weeks   Elevated troponin Asymptomatic Mildly elevated and flat EKG with q's in III and aVF No additional w/u unless she has  sx   Controlled type 2 diabetes mellitus without complication, without long-term current use of insulin (HCC) a1c 6.2 SSI   Essential hypertension amlodipine   Stage 3a chronic kidney disease (CKD) ([place]) noted   Acute urinary retention Foley placed 5/20, likely related to UTI and hip fracture/opiates/immobility  Foley discontinued on 5/23 and she has been passing urine.   Acute cystitis without hematuria Follow cultures -> e. Coli sensitive to cefazolin Currently on cefadroxil Plan for 7 days total therapy   Acute postoperative anemia due to expected blood loss Trend PO iron  Discharge Instructions  Discharge Instructions     Diet - low sodium heart healthy   Complete by: As directed    Discharge wound care:   Complete by: As directed    Reinforce dressing as needed   Increase activity slowly   Complete by: As directed       Allergies as of 12/19/2021       Reactions   Lisinopril    Cough D/Ced by Dr Melvyn Novas   Codeine    nausea   Verapamil    Pain in feet        Medication List     STOP taking these medications    aspirin EC 81 MG tablet   busPIRone 7.5 MG tablet Commonly known as: BUSPAR   cetirizine 10 MG tablet Commonly known as: ZyrTEC Allergy   gabapentin 100 MG capsule Commonly known as: NEURONTIN       TAKE these medications    amLODipine 5 MG tablet Commonly known as: NORVASC Take 1 tablet (5 mg total) by mouth daily.   apixaban 2.5 MG Tabs tablet Commonly known as: Eliquis Take 1 tablet (2.5 mg total)  by mouth 2 (two) times daily.   cefadroxil 500 MG capsule Commonly known as: DURICEF Take 1 capsule (500 mg total) by mouth 2 (two) times daily for 2 days.   CRANBERRY PO Take 2 capsules by mouth 2 (two) times daily. 79m   DIABETIC BASICS HEALTHY FOOT EX Apply 1 application topically daily as needed (foot pain). OTC diabetic foot cream   docusate sodium 100 MG capsule Commonly known as: COLACE Take 1 capsule (100 mg  total) by mouth 2 (two) times daily.   EPINEPHrine 0.3 mg/0.3 mL Soaj injection Commonly known as: EPI-PEN Inject 0.3 mg into the muscle as needed for anaphylaxis.   ESTRACE VAGINAL 0.1 MG/GM vaginal cream Generic drug: estradiol Apply locally every other night at bedtime   EYE VITAMINS PO Take by mouth. AREDS2   ferrous sulfate 325 (65 FE) MG tablet Take 1 tablet (325 mg total) by mouth daily with breakfast. Start taking on: Dec 20, 2021   fluticasone 50 MCG/ACT nasal spray Commonly known as: FLONASE INSTILL 2 SPRAYS INTO EACH NOSTRIL ONCE DAILY What changed:  how much to take how to take this when to take this   glucose blood test strip Commonly known as: TRUEtest Test Use to test blood sugar ICD 10 E11 9 What changed:  how much to take how to take this when to take this   HYDROcodone-acetaminophen 5-325 MG tablet Commonly known as: NORCO/VICODIN Take 1 tablet by mouth every 4 (four) hours as needed for up to 7 days for moderate pain or severe pain.   hydrOXYzine 10 MG tablet Commonly known as: ATARAX Take 10 mg by mouth daily as needed for itching.   polyethylene glycol 17 g packet Commonly known as: MIRALAX / GLYCOLAX Take 17 g by mouth daily. Start taking on: Dec 20, 2021   sitaGLIPtin 50 MG tablet Commonly known as: Januvia Take 1 tablet (50 mg total) by mouth daily.   triamcinolone 0.025 % cream Commonly known as: KENALOG APPLY 1 APPLICATION TOPICALLY TWICE DAILY AS NEEDED FOR DRY SKIN ON BACK What changed:  how much to take how to take this when to take this   True Metrix Meter w/Device Kit Use as directed to monitor glucose up to 2 times daily. E11.9 dx code What changed:  how much to take how to take this when to take this   TRUEplus Lancets 33G Misc Use as directed to check glucose up to 2 times daily. E11.9 dx code What changed:  how much to take how to take this when to take this   Vitamin D (Ergocalciferol) 1.25 MG (50000 UNIT)  Caps capsule Commonly known as: DRISDOL Take 50,000 Units by mouth once a week.   Vitamin D3 1.25 MG (50000 UT) Caps Take 1 weekly for 12 weeks What changed:  how much to take how to take this when to take this               Discharge Care Instructions  (From admission, onward)           Start     Ordered   12/19/21 0000  Discharge wound care:       Comments: Reinforce dressing as needed   12/19/21 1241            Contact information for follow-up providers     S[place], BAaron Edelman MD. Schedule an appointment as soon as possible for a visit in 2 week(s).   Specialty: Orthopedic Surgery Why: For wound re-check, For suture removal  Contact information: [address] [place] [address] [phone]              Contact information for after-discharge care     Destination     [facility] Preferred SNF .   Service: Skilled Nursing Contact information: [address] [address] [phone]                    Allergies  Allergen Reactions   Lisinopril     Cough D/Ced by Dr Melvyn Novas   Codeine     nausea   Verapamil     Pain in feet    Consultations: orthopedics   Procedures/Studies: DG Chest 1 View  Result Date: 12/14/2021 CLINICAL DATA:  Fall with chest and right hip pain. EXAM: DG HIP (WITH OR WITHOUT PELVIS) 2-3V RIGHT; CHEST  1 VIEW COMPARISON:  PA chest and rib films 05/31/2020, AP pelvis and bilateral hip films 10/22/2017. FINDINGS: Chest: The heart is slightly enlarged. There is chronic aortic tortuosity and atherosclerosis. Stable mediastinal configuration. No vascular congestion is seen.  The lungs are clear of infiltrates. The sulci are sharp. Mild chronic interstitial change both lateral bases. Osteopenia and slight lumbar levoscoliosis. No acute osseous abnormality is seen. AP pelvis and AP and ladder left hip views: There is mild osteopenia. Although a discrete cortical step-off is  not seen, there is an interval shortened appearance to the right femoral neck concerning for an impacted transverse subcapital proximal right femoral fracture. Also possible this could have occurred since 10/22/2017 and healed. The proximal left femur is intact AP. No pelvic fracture or diastasis is seen. There are mild features of degenerative arthrosis of the hips. There is advanced degenerative change of the visualized lower lumbar spine again noted. IMPRESSION: 1. No evidence of acute chest disease.  Mild cardiomegaly. 2. Interval new shortened appearance of the right femoral neck since March 2019. This is worrisome for a subcapital transverse impacted femoral neck fracture although no focal cortical step-off is visible. Consider CT to assess chronicity as this could be chronic. 3. Aortic atherosclerosis. Electronically Signed   By: Telford Nab M.D.   On: 12/14/2021 23:16   CT Head Wo Contrast  Result Date: 12/14/2021 CLINICAL DATA:  Head trauma, minor (Age >= 65y). EXAM: CT HEAD WITHOUT CONTRAST TECHNIQUE: Contiguous axial images were obtained from the base of the skull through the vertex without intravenous contrast. RADIATION DOSE REDUCTION: This exam was performed according to the departmental dose-optimization program which includes automated exposure control, adjustment of the mA and/or kV according to patient size and/or use of iterative reconstruction technique. COMPARISON:  06/06/2017 FINDINGS: Brain: Normal anatomic configuration. Parenchymal volume loss is commensurate with the patient's age. Mild periventricular white matter changes are present likely reflecting the sequela of small vessel ischemia. Remote lacunar infarcts within the left thalamus and right paramedian cerebellar infarct are again identified. No abnormal intra or extra-axial mass lesion or fluid collection. No abnormal mass effect or midline shift. No evidence of acute intracranial hemorrhage or infarct. Ventricular size is  normal. Cerebellum unremarkable. Vascular: No asymmetric hyperdense vasculature at the skull base. Skull: Intact Sinuses/Orbits: Paranasal sinuses are clear. Ocular lenses have been removed. Orbits are otherwise unremarkable. Other: Mastoid air cells and middle ear cavities are clear. IMPRESSION: No acute intracranial abnormality. Stable senescent change and remote infarcts as outlined above. Electronically Signed   By: Fidela Salisbury M.D.   On: 12/14/2021 23:21   Pelvis Portable  Result Date:  12/16/2021 CLINICAL DATA:  Status post RIGHT total hip arthroplasty. EXAM: PORTABLE PELVIS 1 VIEWS COMPARISON:  Prior studies FINDINGS: RIGHT total hip arthroplasty noted without definite complicating features. No other acute bony abnormalities are noted. IMPRESSION: RIGHT total hip arthroplasty without definite complicating features. Electronically Signed   By: Margarette Canada M.D.   On: 12/16/2021 12:39   CT Hip Right Wo Contrast  Result Date: 12/15/2021 CLINICAL DATA:  Hip trauma, fracture suspected, pain 10/10. EXAM: CT OF THE RIGHT HIP WITHOUT CONTRAST TECHNIQUE: Multidetector CT imaging of the right hip was performed according to the standard protocol. Multiplanar CT image reconstructions were also generated. RADIATION DOSE REDUCTION: This exam was performed according to the departmental dose-optimization program which includes automated exposure control, adjustment of the mA and/or kV according to patient size and/or use of iterative reconstruction technique. COMPARISON:  AP pelvis and right hip series yesterday, AP pelvis and bilateral hip series 10/22/2017. FINDINGS: Bones/Joint/Cartilage Bone mineralization is osteopenic. There is an acute, closed impacted and otherwise nondisplaced transverse oblique fracture of the proximal right humerus with small comminution fragments at the anterior and posterior fracture margins. There are cystic intermixed with sclerotic changes in the posterior subarticular femoral head  consistent with osteonecrosis which has a chronic appearance with slight deformity of the overlying cortical surface. There is partial superior joint space loss at the hip with small acetabular and femoral head osteophytes consistent with degenerative arthrosis. There is mild spurring of the pubic symphysis. There are no further fractures in the scanned structures. There appears to be a small right hip joint hemarthrosis. Ligaments Suboptimally evaluated by CT. Muscles and Tendons There is normal muscle bulk. No intramuscular hematoma is seen. Area tendons are intact as far seen but not optimally well evaluated. Soft tissues Visualized portions of the right hemipelvis are unremarkable apart from moderate bladder dilatation. There is no free fluid, free air or free hemorrhage. There is stranding in the subcutaneous plane in the right buttock inferiorly probably posttraumatic etiology. Additional stranding lateral to the proximal right femur. There is mild scattered calcific plaque in the right external iliac, common iliac and proximal superficial femoral arteries. IMPRESSION: 1. Acute transverse oblique subcapital proximal right femoral fracture, impacted and otherwise not displaced. 2. Osteopenia and degenerative change. 3. Sclerotic and cystic changes in posterior femoral head consistent with likely chronic osteonecrosis. 4. Suspected small right hip joint hemarthrosis. 5. Subcutaneous stranding likely posttraumatic. 6. Bladder dilatation not fully visualized. 7. Remaining findings discussed above. Electronically Signed   By: Telford Nab M.D.   On: 12/15/2021 02:05   DG C-Arm 1-60 Min-No Report  Result Date: 12/16/2021 Fluoroscopy was utilized by the requesting physician.  No radiographic interpretation.   DG HIP UNILAT WITH PELVIS 1V RIGHT  Result Date: 12/16/2021 CLINICAL DATA:  RIGHT total hip arthroplasty EXAM: DG HIP (WITH OR WITHOUT PELVIS) 2V RIGHT COMPARISON:  Prior studies FINDINGS:  Intraoperative spot views of the RIGHT hip are submitted postoperatively for interpretation. RIGHT total hip arthroplasty changes noted without definite complicating features, although on the final film, portion of the hardware and femur are off the field of view. IMPRESSION: RIGHT total hip arthroplasty as described. Electronically Signed   By: Margarette Canada M.D.   On: 12/16/2021 12:41   DG HIP UNILAT WITH PELVIS 2-3 VIEWS RIGHT  Result Date: 12/14/2021 CLINICAL DATA:  Fall with chest and right hip pain. EXAM: DG HIP (WITH OR WITHOUT PELVIS) 2-3V RIGHT; CHEST  1 VIEW COMPARISON:  PA chest and rib films 05/31/2020,  AP pelvis and bilateral hip films 10/22/2017. FINDINGS: Chest: The heart is slightly enlarged. There is chronic aortic tortuosity and atherosclerosis. Stable mediastinal configuration. No vascular congestion is seen.  The lungs are clear of infiltrates. The sulci are sharp. Mild chronic interstitial change both lateral bases. Osteopenia and slight lumbar levoscoliosis. No acute osseous abnormality is seen. AP pelvis and AP and ladder left hip views: There is mild osteopenia. Although a discrete cortical step-off is not seen, there is an interval shortened appearance to the right femoral neck concerning for an impacted transverse subcapital proximal right femoral fracture. Also possible this could have occurred since 10/22/2017 and healed. The proximal left femur is intact AP. No pelvic fracture or diastasis is seen. There are mild features of degenerative arthrosis of the hips. There is advanced degenerative change of the visualized lower lumbar spine again noted. IMPRESSION: 1. No evidence of acute chest disease.  Mild cardiomegaly. 2. Interval new shortened appearance of the right femoral neck since March 2019. This is worrisome for a subcapital transverse impacted femoral neck fracture although no focal cortical step-off is visible. Consider CT to assess chronicity as this could be chronic. 3. Aortic  atherosclerosis. Electronically Signed   By: Telford Nab M.D.   On: 12/14/2021 23:16      Subjective: Feels well, no complaints today. Passing urine without catheter. Had a bowel movement yesterday  Discharge Exam: Vitals:   12/18/21 1316 12/18/21 2113 12/19/21 0626 12/19/21 0820  BP: 126/69 (!) 142/89 (!) 164/89 136/71  Pulse: 91 92 (!) 106 89  Resp: _0 Temp: 98.5 F (36.9 C) 98.2 F (36.8 C) 98.2 F (36.8 C) 98.2 F (36.8 C)  TempSrc: Oral Oral  Oral  SpO2: 95% 99% 98% 94%  Weight:      Height:        General: Pt is alert, awake, not in acute distress Cardiovascular: RRR, S1/S2 +, no rubs, no gallops Respiratory: CTA bilaterally, no wheezing, no rhonchi Abdominal: Soft, NT, ND, bowel sounds + Extremities: no edema, no cyanosis    The results of significant diagnostics from this hospitalization (including imaging, microbiology, ancillary and laboratory) are listed below for reference.     Microbiology: Recent Results (from the past 240 hour(s))  Urine Culture     Status: Abnormal   Collection Time: 12/14/21  7:13 AM   Specimen: Urine, Clean Catch  Result Value Ref Range Status   Specimen Description   Final    URINE, CLEAN CATCH Performed at [facility], [place] [address] [place], [place] [address]    Special Requests   Final    NONE Performed at [facility], [place] [address], [place], [place] [address]    Culture >=100,000 COLONIES/mL ESCHERICHIA COLI (A)  Final   Report Status 12/17/2021 FINAL  Final   Organism ID, Bacteria ESCHERICHIA COLI (A)  Final      Susceptibility   Escherichia coli - MIC*    AMPICILLIN >=32 RESISTANT Resistant     CEFAZOLIN <=4 SENSITIVE Sensitive     CEFEPIME <=0.12 SENSITIVE Sensitive     CEFTRIAXONE <=0.25 SENSITIVE Sensitive     CIPROFLOXACIN <=0.25 SENSITIVE Sensitive     GENTAMICIN >=16 RESISTANT Resistant     IMIPENEM <=0.25 SENSITIVE Sensitive     NITROFURANTOIN <=16  SENSITIVE Sensitive     TRIMETH/SULFA >=320 RESISTANT Resistant     AMPICILLIN/SULBACTAM 16 INTERMEDIATE Intermediate     PIP/TAZO <=4 SENSITIVE Sensitive     * >=  100,000 COLONIES/mL ESCHERICHIA COLI  Surgical pcr screen     Status: Abnormal   Collection Time: 12/16/21  5:01 AM   Specimen: Nasal Mucosa; Nasal Swab  Result Value Ref Range Status   MRSA, PCR NEGATIVE NEGATIVE Final   Staphylococcus aureus POSITIVE (A) NEGATIVE Final    Comment: (NOTE) The Xpert SA Assay (FDA approved for NASAL specimens in patients 3 years of age and older), is one component of a comprehensive surveillance program. It is not intended to diagnose infection nor to guide or monitor treatment. Performed at [facility], [place] [address] [place], [place] [address]      Labs: BNP (last 3 results) No results for input(s): BNP in the last 8760 hours. Basic Metabolic Panel: Recent Labs  Lab 12/14/21 2314 12/16/21 0253 12/17/21 0739 12/18/21 0258 12/19/21 0255  NA 142 143 140 140 141  K 3.7 3.8 4.1 4.2 4.3  CL 110 109 108 108 106  CO2 _0 GLUCOSE 146* 99 118* 106* 148*  BUN 24* 23 24* 24* 24*  CREATININE 0.99 0.67 0.86 0.90 0.76  CALCIUM 8.6* 8.6* 8.3* 8.0* 8.8*  MG  --   --   --  2.2 2.4  PHOS  --   --   --  3.4 3.2   Liver Function Tests: Recent Labs  Lab 12/17/21 0739 12/18/21 0258 12/19/21 0255  AST _1 ALT _2 ALKPHOS 43 40 53  BILITOT 0.7 1.0 0.9  PROT 5.4* 5.3* 6.2*  ALBUMIN 2.9* 2.7* 3.2*   No results for input(s): LIPASE, AMYLASE in the last 168 hours. No results for input(s): AMMONIA in the last 168 hours. CBC: Recent Labs  Lab 12/14/21 2209 12/16/21 0253 12/17/21 0739 12/18/21 0258 12/19/21 0255  WBC 9.9 8.1 12.5* 8.8 7.8  NEUTROABS 6.9  --  9.4* 5.7 4.8  HGB 14.8 12.6 10.0* 9.7* 10.5*  HCT 42.5 38.2 29.8* 29.3* 32.9*  MCV 88.7 90.5 91.1 91.0 92.7  PLT 225 263 206 226 269   Cardiac Enzymes: No results for input(s):  CKTOTAL, CKMB, CKMBINDEX, TROPONINI in the last 168 hours. BNP: Invalid input(s): POCBNP CBG: Recent Labs  Lab 12/18/21 1156 12/18/21 1651 12/18/21 2111 12/19/21 0737 12/19/21 1213  GLUCAP 175* 149* 124* 144* 170*   D-Dimer No results for input(s): DDIMER in the last 72 hours. Hgb A1c No results for input(s): HGBA1C in the last 72 hours. Lipid Profile No results for input(s): CHOL, HDL, LDLCALC, TRIG, CHOLHDL, LDLDIRECT in the last 72 hours. Thyroid function studies No results for input(s): TSH, T4TOTAL, T3FREE, THYROIDAB in the last 72 hours.  Invalid input(s): FREET3 Anemia work up No results for input(s): VITAMINB12, FOLATE, FERRITIN, TIBC, IRON, RETICCTPCT in the last 72 hours. Urinalysis    Component Value Date/Time   COLORURINE YELLOW 12/14/2021 2251   APPEARANCEUR HAZY (A) 12/14/2021 2251   LABSPEC 1.013 12/14/2021 2251   PHURINE 5.0 12/14/2021 2251   GLUCOSEU NEGATIVE 12/14/2021 2251   HGBUR NEGATIVE 12/14/2021 2251   HGBUR negative 08/02/2009 1512   BILIRUBINUR NEGATIVE 12/14/2021 2251   BILIRUBINUR negative 08/25/2019 1154   BILIRUBINUR Negative 12/16/2017 1115   KETONESUR NEGATIVE 12/14/2021 2251   PROTEINUR NEGATIVE 12/14/2021 2251   UROBILINOGEN 0.2 08/25/2019 1154   UROBILINOGEN 0.2 12/12/2014 1623   NITRITE POSITIVE (A) 12/14/2021 2251   LEUKOCYTESUR MODERATE (A) 12/14/2021 2251   Sepsis Labs Invalid input(s): PROCALCITONIN,  WBC,  LACTICIDVEN Microbiology Recent Results (from the past 240  hour(s))  Urine Culture     Status: Abnormal   Collection Time: 12/14/21  7:13 AM   Specimen: Urine, Clean Catch  Result Value Ref Range Status   Specimen Description   Final    URINE, CLEAN CATCH Performed at [facility], [place] [address], [place], [place] [address]    Special Requests   Final    NONE Performed at [facility], [place] [address], [place], [place] [address]    Culture >=100,000 COLONIES/mL ESCHERICHIA COLI  (A)  Final   Report Status 12/17/2021 FINAL  Final   Organism ID, Bacteria ESCHERICHIA COLI (A)  Final      Susceptibility   Escherichia coli - MIC*    AMPICILLIN >=32 RESISTANT Resistant     CEFAZOLIN <=4 SENSITIVE Sensitive     CEFEPIME <=0.12 SENSITIVE Sensitive     CEFTRIAXONE <=0.25 SENSITIVE Sensitive     CIPROFLOXACIN <=0.25 SENSITIVE Sensitive     GENTAMICIN >=16 RESISTANT Resistant     IMIPENEM <=0.25 SENSITIVE Sensitive     NITROFURANTOIN <=16 SENSITIVE Sensitive     TRIMETH/SULFA >=320 RESISTANT Resistant     AMPICILLIN/SULBACTAM 16 INTERMEDIATE Intermediate     PIP/TAZO <=4 SENSITIVE Sensitive     * >=100,000 COLONIES/mL ESCHERICHIA COLI  Surgical pcr screen     Status: Abnormal   Collection Time: 12/16/21  5:01 AM   Specimen: Nasal Mucosa; Nasal Swab  Result Value Ref Range Status   MRSA, PCR NEGATIVE NEGATIVE Final   Staphylococcus aureus POSITIVE (A) NEGATIVE Final    Comment: (NOTE) The Xpert SA Assay (FDA approved for NASAL specimens in patients 38 years of age and older), is one component of a comprehensive surveillance program. It is not intended to diagnose infection nor to guide or monitor treatment. Performed at [facility], [place] [address] [place], [place] [address]      Time coordinating discharge: 65mns  SIGNED:   JKathie Dike MD  [facility] 12/19/2021, 12:41 PM   If 7PM-7AM, please contact night-coverage [email or web address]

## 2021-12-19 NOTE — TOC Transition Note (Signed)
Transition of Care [facility]) - CM/SW Discharge Note   Patient Details  Name: Alexa [name] MRN: [id number] Date of Birth: [birth year]  [facility] CM/SW Contact:  Lennart Pall, LCSW Phone Number: 12/19/2021, 12:59 PM   Clinical Narrative:     Pt medically cleared for dc today to [facility] SNF.  PTAR called at 12:55pm and pt/ niece aware and agreeable.  RN to call report to [phone].  No further TOC needs.  Final next level of care: Skilled Nursing Facility Barriers to Discharge: Barriers Resolved   Patient Goals and CMS Choice Patient states their goals for this hospitalization and ongoing recovery are:: to return home following SNF rehab CMS Medicare.gov Compare Post Acute Care list provided to:: Patient Choice offered to / list presented to : Patient  Discharge Placement PASRR number recieved: 12/17/21            Patient chooses bed at: [facility] Patient to be transferred to facility by: [place] Name of family member notified: niece Patient and family notified of of transfer: 12/19/21  Discharge Plan and Services In-house Referral: Clinical Social Work   Post Acute Care Choice: [place]          DME Arranged: N/A DME Agency: NA                  Social Determinants of Health ([place]) Interventions     Readmission Risk Interventions    12/17/2021    1:08 PM  Readmission Risk Prevention Plan  Transportation Screening Complete  PCP or Specialist Appt within 5-7 Days Complete  Home Care Screening Complete  Medication Review (RN CM) Complete

## 2021-12-20 DIAGNOSIS — M25551 Pain in right hip: Secondary | ICD-10-CM | POA: Diagnosis not present

## 2021-12-20 DIAGNOSIS — N390 Urinary tract infection, site not specified: Secondary | ICD-10-CM | POA: Diagnosis not present

## 2021-12-20 DIAGNOSIS — R300 Dysuria: Secondary | ICD-10-CM | POA: Diagnosis not present

## 2021-12-20 DIAGNOSIS — K5900 Constipation, unspecified: Secondary | ICD-10-CM | POA: Diagnosis not present

## 2021-12-25 DIAGNOSIS — E1122 Type 2 diabetes mellitus with diabetic chronic kidney disease: Secondary | ICD-10-CM | POA: Diagnosis not present

## 2021-12-25 DIAGNOSIS — E785 Hyperlipidemia, unspecified: Secondary | ICD-10-CM | POA: Diagnosis not present

## 2021-12-25 DIAGNOSIS — S72001D Fracture of unspecified part of neck of right femur, subsequent encounter for closed fracture with routine healing: Secondary | ICD-10-CM | POA: Diagnosis not present

## 2021-12-25 DIAGNOSIS — I10 Essential (primary) hypertension: Secondary | ICD-10-CM | POA: Diagnosis not present

## 2022-02-04 DIAGNOSIS — S72031D Displaced midcervical fracture of right femur, subsequent encounter for closed fracture with routine healing: Secondary | ICD-10-CM | POA: Diagnosis not present

## 2022-02-13 DIAGNOSIS — E611 Iron deficiency: Secondary | ICD-10-CM | POA: Diagnosis not present

## 2022-02-13 DIAGNOSIS — F419 Anxiety disorder, unspecified: Secondary | ICD-10-CM | POA: Diagnosis not present

## 2022-02-13 DIAGNOSIS — F0390 Unspecified dementia without behavioral disturbance: Secondary | ICD-10-CM | POA: Diagnosis not present

## 2022-02-13 DIAGNOSIS — M545 Low back pain, unspecified: Secondary | ICD-10-CM | POA: Diagnosis not present

## 2022-02-13 DIAGNOSIS — M1990 Unspecified osteoarthritis, unspecified site: Secondary | ICD-10-CM | POA: Diagnosis not present

## 2022-02-13 DIAGNOSIS — I10 Essential (primary) hypertension: Secondary | ICD-10-CM | POA: Diagnosis not present

## 2022-02-13 DIAGNOSIS — E119 Type 2 diabetes mellitus without complications: Secondary | ICD-10-CM | POA: Diagnosis not present

## 2022-02-13 DIAGNOSIS — F325 Major depressive disorder, single episode, in full remission: Secondary | ICD-10-CM | POA: Diagnosis not present

## 2022-02-13 DIAGNOSIS — R32 Unspecified urinary incontinence: Secondary | ICD-10-CM | POA: Diagnosis not present

## 2022-02-14 DIAGNOSIS — Z91018 Allergy to other foods: Secondary | ICD-10-CM | POA: Diagnosis not present

## 2022-02-14 DIAGNOSIS — M810 Age-related osteoporosis without current pathological fracture: Secondary | ICD-10-CM | POA: Diagnosis not present

## 2022-02-14 DIAGNOSIS — I10 Essential (primary) hypertension: Secondary | ICD-10-CM | POA: Diagnosis not present

## 2022-02-14 DIAGNOSIS — R2681 Unsteadiness on feet: Secondary | ICD-10-CM | POA: Diagnosis not present

## 2022-02-14 DIAGNOSIS — E441 Mild protein-calorie malnutrition: Secondary | ICD-10-CM | POA: Diagnosis not present

## 2022-02-14 DIAGNOSIS — R059 Cough, unspecified: Secondary | ICD-10-CM | POA: Diagnosis not present

## 2022-02-14 DIAGNOSIS — L309 Dermatitis, unspecified: Secondary | ICD-10-CM | POA: Diagnosis not present

## 2022-02-14 DIAGNOSIS — N390 Urinary tract infection, site not specified: Secondary | ICD-10-CM | POA: Diagnosis not present

## 2022-02-14 DIAGNOSIS — R634 Abnormal weight loss: Secondary | ICD-10-CM | POA: Diagnosis not present

## 2022-04-09 DIAGNOSIS — M549 Dorsalgia, unspecified: Secondary | ICD-10-CM | POA: Diagnosis not present

## 2022-04-09 DIAGNOSIS — N390 Urinary tract infection, site not specified: Secondary | ICD-10-CM | POA: Diagnosis not present

## 2022-04-09 DIAGNOSIS — R300 Dysuria: Secondary | ICD-10-CM | POA: Diagnosis not present

## 2022-04-16 DIAGNOSIS — E559 Vitamin D deficiency, unspecified: Secondary | ICD-10-CM | POA: Diagnosis not present

## 2022-04-16 DIAGNOSIS — R82998 Other abnormal findings in urine: Secondary | ICD-10-CM | POA: Diagnosis not present

## 2022-04-16 DIAGNOSIS — I10 Essential (primary) hypertension: Secondary | ICD-10-CM | POA: Diagnosis not present

## 2022-04-16 DIAGNOSIS — E785 Hyperlipidemia, unspecified: Secondary | ICD-10-CM | POA: Diagnosis not present

## 2022-04-16 DIAGNOSIS — F419 Anxiety disorder, unspecified: Secondary | ICD-10-CM | POA: Diagnosis not present

## 2022-04-16 DIAGNOSIS — R7989 Other specified abnormal findings of blood chemistry: Secondary | ICD-10-CM | POA: Diagnosis not present

## 2022-04-16 DIAGNOSIS — M810 Age-related osteoporosis without current pathological fracture: Secondary | ICD-10-CM | POA: Diagnosis not present

## 2022-04-16 DIAGNOSIS — E1169 Type 2 diabetes mellitus with other specified complication: Secondary | ICD-10-CM | POA: Diagnosis not present

## 2022-04-23 DIAGNOSIS — I10 Essential (primary) hypertension: Secondary | ICD-10-CM | POA: Diagnosis not present

## 2022-04-23 DIAGNOSIS — E441 Mild protein-calorie malnutrition: Secondary | ICD-10-CM | POA: Diagnosis not present

## 2022-04-23 DIAGNOSIS — L309 Dermatitis, unspecified: Secondary | ICD-10-CM | POA: Diagnosis not present

## 2022-04-23 DIAGNOSIS — M810 Age-related osteoporosis without current pathological fracture: Secondary | ICD-10-CM | POA: Diagnosis not present

## 2022-04-23 DIAGNOSIS — Z91018 Allergy to other foods: Secondary | ICD-10-CM | POA: Diagnosis not present

## 2022-04-23 DIAGNOSIS — D6859 Other primary thrombophilia: Secondary | ICD-10-CM | POA: Diagnosis not present

## 2022-04-23 DIAGNOSIS — R059 Cough, unspecified: Secondary | ICD-10-CM | POA: Diagnosis not present

## 2022-04-23 DIAGNOSIS — E1169 Type 2 diabetes mellitus with other specified complication: Secondary | ICD-10-CM | POA: Diagnosis not present

## 2022-04-23 DIAGNOSIS — F419 Anxiety disorder, unspecified: Secondary | ICD-10-CM | POA: Diagnosis not present

## 2022-04-23 DIAGNOSIS — D649 Anemia, unspecified: Secondary | ICD-10-CM | POA: Diagnosis not present

## 2022-04-25 ENCOUNTER — Other Ambulatory Visit (HOSPITAL_COMMUNITY): Payer: Self-pay | Admitting: *Deleted

## 2022-04-29 ENCOUNTER — Inpatient Hospital Stay (HOSPITAL_COMMUNITY): Admission: RE | Admit: 2022-04-29 | Payer: Medicare HMO | Source: Ambulatory Visit

## 2022-05-31 DIAGNOSIS — M6281 Muscle weakness (generalized): Secondary | ICD-10-CM | POA: Diagnosis not present

## 2022-05-31 DIAGNOSIS — M25651 Stiffness of right hip, not elsewhere classified: Secondary | ICD-10-CM | POA: Diagnosis not present

## 2022-05-31 DIAGNOSIS — Z4789 Encounter for other orthopedic aftercare: Secondary | ICD-10-CM | POA: Diagnosis not present

## 2022-05-31 DIAGNOSIS — M5451 Vertebrogenic low back pain: Secondary | ICD-10-CM | POA: Diagnosis not present

## 2022-05-31 DIAGNOSIS — R260 Ataxic gait: Secondary | ICD-10-CM | POA: Diagnosis not present

## 2022-06-03 DIAGNOSIS — M5451 Vertebrogenic low back pain: Secondary | ICD-10-CM | POA: Diagnosis not present

## 2022-06-03 DIAGNOSIS — Z4789 Encounter for other orthopedic aftercare: Secondary | ICD-10-CM | POA: Diagnosis not present

## 2022-06-03 DIAGNOSIS — M6281 Muscle weakness (generalized): Secondary | ICD-10-CM | POA: Diagnosis not present

## 2022-06-03 DIAGNOSIS — R260 Ataxic gait: Secondary | ICD-10-CM | POA: Diagnosis not present

## 2022-06-03 DIAGNOSIS — M25651 Stiffness of right hip, not elsewhere classified: Secondary | ICD-10-CM | POA: Diagnosis not present

## 2022-06-05 DIAGNOSIS — R260 Ataxic gait: Secondary | ICD-10-CM | POA: Diagnosis not present

## 2022-06-05 DIAGNOSIS — M6281 Muscle weakness (generalized): Secondary | ICD-10-CM | POA: Diagnosis not present

## 2022-06-05 DIAGNOSIS — M5451 Vertebrogenic low back pain: Secondary | ICD-10-CM | POA: Diagnosis not present

## 2022-06-05 DIAGNOSIS — Z4789 Encounter for other orthopedic aftercare: Secondary | ICD-10-CM | POA: Diagnosis not present

## 2022-06-05 DIAGNOSIS — M25651 Stiffness of right hip, not elsewhere classified: Secondary | ICD-10-CM | POA: Diagnosis not present

## 2022-06-07 DIAGNOSIS — M6281 Muscle weakness (generalized): Secondary | ICD-10-CM | POA: Diagnosis not present

## 2022-06-07 DIAGNOSIS — M5451 Vertebrogenic low back pain: Secondary | ICD-10-CM | POA: Diagnosis not present

## 2022-06-07 DIAGNOSIS — Z4789 Encounter for other orthopedic aftercare: Secondary | ICD-10-CM | POA: Diagnosis not present

## 2022-06-07 DIAGNOSIS — R260 Ataxic gait: Secondary | ICD-10-CM | POA: Diagnosis not present

## 2022-06-07 DIAGNOSIS — M25651 Stiffness of right hip, not elsewhere classified: Secondary | ICD-10-CM | POA: Diagnosis not present

## 2022-07-07 ENCOUNTER — Emergency Department (HOSPITAL_COMMUNITY)
Admission: EM | Admit: 2022-07-07 | Discharge: 2022-07-07 | Disposition: A | Discharge status: Home / Self Care | Payer: Medicare HMO | Attending: Emergency Medicine | Admitting: Emergency Medicine

## 2022-07-07 ENCOUNTER — Other Ambulatory Visit: Payer: Self-pay

## 2022-07-07 DIAGNOSIS — N189 Chronic kidney disease, unspecified: Secondary | ICD-10-CM | POA: Insufficient documentation

## 2022-07-07 DIAGNOSIS — Z7984 Long term (current) use of oral hypoglycemic drugs: Secondary | ICD-10-CM | POA: Diagnosis not present

## 2022-07-07 DIAGNOSIS — D72829 Elevated white blood cell count, unspecified: Secondary | ICD-10-CM | POA: Insufficient documentation

## 2022-07-07 DIAGNOSIS — M549 Dorsalgia, unspecified: Secondary | ICD-10-CM | POA: Diagnosis present

## 2022-07-07 DIAGNOSIS — N3000 Acute cystitis without hematuria: Secondary | ICD-10-CM | POA: Insufficient documentation

## 2022-07-07 DIAGNOSIS — N390 Urinary tract infection, site not specified: Secondary | ICD-10-CM | POA: Diagnosis not present

## 2022-07-07 DIAGNOSIS — Z79899 Other long term (current) drug therapy: Secondary | ICD-10-CM | POA: Diagnosis not present

## 2022-07-07 DIAGNOSIS — I10 Essential (primary) hypertension: Secondary | ICD-10-CM | POA: Diagnosis not present

## 2022-07-07 DIAGNOSIS — R9431 Abnormal electrocardiogram [ECG] [EKG]: Secondary | ICD-10-CM | POA: Diagnosis not present

## 2022-07-07 DIAGNOSIS — E1122 Type 2 diabetes mellitus with diabetic chronic kidney disease: Secondary | ICD-10-CM | POA: Diagnosis not present

## 2022-07-07 DIAGNOSIS — E1165 Type 2 diabetes mellitus with hyperglycemia: Secondary | ICD-10-CM | POA: Diagnosis not present

## 2022-07-07 LAB — CBC WITH DIFFERENTIAL/PLATELET
Abs Immature Granulocytes: 0.03 10*3/uL (ref 0.00–0.07)
Basophils Absolute: 0.1 10*3/uL (ref 0.0–0.1)
Basophils Relative: 1 %
Eosinophils Absolute: 0.6 10*3/uL — ABNORMAL HIGH (ref 0.0–0.5)
Eosinophils Relative: 6 %
HCT: 46.5 % — ABNORMAL HIGH (ref 36.0–46.0)
Hemoglobin: 15 g/dL (ref 12.0–15.0)
Immature Granulocytes: 0 %
Lymphocytes Relative: 20 %
Lymphs Abs: 2.1 10*3/uL (ref 0.7–4.0)
MCH: 29.7 pg (ref 26.0–34.0)
MCHC: 32.3 g/dL (ref 30.0–36.0)
MCV: 92.1 fL (ref 80.0–100.0)
Monocytes Absolute: 0.7 10*3/uL (ref 0.1–1.0)
Monocytes Relative: 6 %
Neutro Abs: 7.1 10*3/uL (ref 1.7–7.7)
Neutrophils Relative %: 67 %
Platelets: 289 10*3/uL (ref 150–400)
RBC: 5.05 MIL/uL (ref 3.87–5.11)
RDW: 13.2 % (ref 11.5–15.5)
WBC: 10.6 10*3/uL — ABNORMAL HIGH (ref 4.0–10.5)
nRBC: 0 % (ref 0.0–0.2)

## 2022-07-07 LAB — URINALYSIS, ROUTINE W REFLEX MICROSCOPIC
Bilirubin Urine: NEGATIVE
Glucose, UA: NEGATIVE mg/dL
Hgb urine dipstick: NEGATIVE
Ketones, ur: NEGATIVE mg/dL
Nitrite: POSITIVE — AB
Protein, ur: 30 mg/dL — AB
Specific Gravity, Urine: 1.017 (ref 1.005–1.030)
WBC, UA: >50 WBC/hpf — ABNORMAL HIGH (ref 0–5)
pH: 5 (ref 5.0–8.0)

## 2022-07-07 LAB — LIPASE, BLOOD: Lipase: 64 U/L — ABNORMAL HIGH (ref 11–51)

## 2022-07-07 LAB — COMPREHENSIVE METABOLIC PANEL
ALT: 16 U/L (ref 0–44)
AST: 21 U/L (ref 15–41)
Albumin: 3.8 g/dL (ref 3.5–5.0)
Alkaline Phosphatase: 55 U/L (ref 38–126)
Anion gap: 9 (ref 5–15)
BUN: 25 mg/dL — ABNORMAL HIGH (ref 8–23)
CO2: 24 mmol/L (ref 22–32)
Calcium: 9 mg/dL (ref 8.9–10.3)
Chloride: 107 mmol/L (ref 98–111)
Creatinine, Ser: 0.74 mg/dL (ref 0.44–1.00)
GFR, Estimated: >60 mL/min (ref >60)
Glucose, Bld: 128 mg/dL — ABNORMAL HIGH (ref 70–99)
Potassium: 4 mmol/L (ref 3.5–5.1)
Sodium: 140 mmol/L (ref 135–145)
Total Bilirubin: 1.4 mg/dL — ABNORMAL HIGH (ref 0.3–1.2)
Total Protein: 7.3 g/dL (ref 6.5–8.1)

## 2022-07-07 MED ORDER — PHENAZOPYRIDINE HCL 200 MG PO TABS: 200.0000 mg | ORAL_TABLET | Freq: Three times a day (TID) | ORAL | 0 refills | Status: DC

## 2022-07-07 MED ORDER — OXYCODONE-ACETAMINOPHEN 5-325 MG PO TABS
1.0000 | ORAL_TABLET | Freq: Once | ORAL | Status: AC
  Administered 2022-07-07: 1 via ORAL
  Filled 2022-07-07: qty 1

## 2022-07-07 MED ORDER — CEPHALEXIN 500 MG PO CAPS: 500.0000 mg | ORAL_CAPSULE | Freq: Three times a day (TID) | ORAL | 0 refills | Status: DC

## 2022-07-07 NOTE — ED Provider Triage Note (Signed)
Emergency Medicine Provider Triage Evaluation Note  Alexa [name] , a 88 y.o. female  was evaluated in triage.  Pt complains of back pain and hematuria.  Patient states that back pain has worsened over the past week.  She reports history of chronic back pain from prior falls of which she takes hydrocodone at baseline.  She states that pain has gotten worse over the past week but denies any recent falls/traumas.  Denies weakness/sensory deficit in lower extremities, saddle anesthesia, bowel/bladder dysfunction, fever, history of IV drug use.  She states that she has been "weak all over."  She is also noticed intermittent hematuria as well as urinary frequency over the past 2 days.  Was seen by urgent care earlier today and sent to emergency department for further evaluation.  Review of Systems  Positive: See above Negative:   Physical Exam  BP (!) 154/94 (BP Location: Left Arm)   Pulse (!) 110   Temp 98.3 F (36.8 C) (Oral)   Resp 18   Ht '5\' 1"'$  (1.549 m)   Wt 53.5 kg   SpO2 99%   BMI 22.29 kg/m  Gen:   Awake, no distress  Resp:  Normal effort  MSK:   Moves extremities without difficulty  Other:  No abdominal tenderness.  Medical Decision Making  Medically screening exam initiated at 3:40 PM.  Appropriate orders placed.  Alexa [name] was informed that the remainder of the evaluation will be completed by another provider, this initial triage assessment does not replace that evaluation, and the importance of remaining in the ED until their evaluation is complete.     Wilnette Kales, [place] 07/07/22 1542

## 2022-07-07 NOTE — ED Triage Notes (Signed)
Pt reports bilateral lower back pain "Kidney issues", nausea. Denies cp in triage

## 2022-07-07 NOTE — ED Provider Notes (Addendum)
[facility] DEPT Provider Note   CSN: [id number] Arrival date & time: 07/07/22  1442     History  Chief Complaint  Patient presents with   Back Pain    Alexa [name] is a 88 y.o. female.  HPI    This was a telephone encounter - as patient had eloped.  Alexa [name] is a 88 year old female with history of UTI, diabetes, CKD and left-sided nephrectomy who comes into the emergency room with chief complaint of urinary frequency and back pain.  Patient states that her urinary frequency started last night, and she has been having some discomfort with urination.  She has been taking cranberry pills and has avoided UTIs for the last few months.  She is also having back pain that is midline starting yesterday.  Review of systems negative for vomiting, fevers, chills but patient has been feeling weak.  Patient was seen by the provider in triage and had a medical screening evaluation.  After waiting for 2 or 3 hours, with uncertainty on when she would be seen she left the ER.  I spoke with patient's brother, who states that patient otherwise looks well besides having the urinary symptoms.  Home Medications Prior to Admission medications   Medication Sig Start Date End Date Taking? Authorizing Provider  cephALEXin (KEFLEX) 500 MG capsule Take 1 capsule (500 mg total) by mouth 3 (three) times daily. 07/07/22  Yes Varney Biles, MD  phenazopyridine (PYRIDIUM) 200 MG tablet Take 1 tablet (200 mg total) by mouth 3 (three) times daily. 07/07/22  Yes Stephie Xu, MD  amLODipine (NORVASC) 5 MG tablet Take 1 tablet (5 mg total) by mouth daily. 07/27/20   Copland, Gay Filler, MD  apixaban (ELIQUIS) 2.5 MG TABS tablet Take 1 tablet (2.5 mg total) by mouth 2 (two) times daily. 12/17/21 01/16/22  Charlott Rakes, PA-C  Benzalkonium Chloride (DIABETIC BASICS HEALTHY FOOT EX) Apply 1 application topically daily as needed (foot pain). OTC diabetic foot cream    [provider]  Blood Glucose Monitoring Suppl (TRUE METRIX METER) w/Device KIT Use as directed to monitor glucose up to 2 times daily. E11.9 dx code Patient taking differently: 1 each by Other route See admin instructions. Use as directed to monitor glucose up to 2 times daily. E11.9 dx code 10/04/20   Copland, Gay Filler, MD  Cholecalciferol (VITAMIN D3) 1.25 MG (50000 UT) CAPS Take 1 weekly for 12 weeks Patient taking differently: Take 1.25 mg by mouth once a week. Take 1 weekly for 12 weeks 10/18/20   Copland, Gay Filler, MD  CRANBERRY PO Take 2 capsules by mouth 2 (two) times daily. 31m    [provider]  docusate sodium (COLACE) 100 MG capsule Take 1 capsule (100 mg total) by mouth 2 (two) times daily. 12/19/21   MKathie Dike MD  EPINEPHrine 0.3 mg/0.3 mL IJ SOAJ injection Inject 0.3 mg into the muscle as needed for anaphylaxis. 01/19/21   KGarnet Sierras DO  ESTRACE VAGINAL 0.1 MG/GM vaginal cream Apply locally every other night at bedtime 01/16/16   [provider]  ferrous sulfate 325 (65 FE) MG tablet Take 1 tablet (325 mg total) by mouth daily with breakfast. 12/20/21   MKathie Dike MD  fluticasone (FLONASE) 50 MCG/ACT nasal spray INSTILL 2 SPRAYS INTO EACH NOSTRIL ONCE DAILY Patient taking differently: Place 2 sprays into both nostrils 2 (two) times daily. INSTILL 2 SPRAYS INTO EACH NOSTRIL ONCE DAILY 04/28/20   Copland, JGay Filler MD  glucose blood (TRUETEST TEST) test strip Use to test blood sugar ICD 10 E11 9 Patient taking differently: 1 each by Other route See admin instructions. Use to test blood sugar ICD 10 E11 9 07/14/14   Hendricks Limes, MD  hydrOXYzine (ATARAX) 10 MG tablet Take 10 mg by mouth daily as needed for itching. 11/13/21   [provider]  Multiple Vitamins-Minerals (EYE VITAMINS PO) Take by mouth. AREDS2    [provider]  polyethylene glycol (MIRALAX / GLYCOLAX) 17 g packet Take 17 g by mouth daily. 12/20/21   Kathie Dike, MD   sitaGLIPtin (JANUVIA) 50 MG tablet Take 1 tablet (50 mg total) by mouth daily. 10/19/20   Copland, Gay Filler, MD  triamcinolone (KENALOG) 0.025 % cream APPLY 1 APPLICATION TOPICALLY TWICE DAILY AS NEEDED FOR DRY SKIN ON BACK Patient taking differently: Apply 1 application. topically See admin instructions. APPLY 1 APPLICATION TOPICALLY TWICE DAILY AS NEEDED FOR DRY SKIN ON BACK 04/28/20   Copland, Gay Filler, MD  TRUEplus Lancets 33G MISC Use as directed to check glucose up to 2 times daily. E11.9 dx code Patient taking differently: 1 each by Other route See admin instructions. Use as directed to check glucose up to 2 times daily. E11.9 dx code 10/04/20   Copland, Gay Filler, MD  Vitamin D, Ergocalciferol, (DRISDOL) 1.25 MG (50000 UNIT) CAPS capsule Take 50,000 Units by mouth once a week. 11/29/21   [provider]      Allergies    Lisinopril, Codeine, and Verapamil    Review of Systems   Review of Systems  All other systems reviewed and are negative.   Physical Exam Updated Vital Signs BP (!) 154/94 (BP Location: Left Arm)   Pulse (!) 110   Temp 98.3 F (36.8 C) (Oral)   Resp 18   Ht _0  (1.549 m)   Wt 53.5 kg   SpO2 99%   BMI 22.29 kg/m  Physical Exam Vitals and nursing note reviewed.  Constitutional:      Appearance: She is well-developed.  Neurological:     Mental Status: She is alert and oriented to person, place, and time.     ED Results / Procedures / Treatments   Labs (all labs ordered are listed, but only abnormal results are displayed) Labs Reviewed  COMPREHENSIVE METABOLIC PANEL - Abnormal; Notable for the following components:      Result Value   Glucose, Bld 128 (*)    BUN 25 (*)    Total Bilirubin 1.4 (*)    All other components within normal limits  CBC WITH DIFFERENTIAL/PLATELET - Abnormal; Notable for the following components:   WBC 10.6 (*)    HCT 46.5 (*)    Eosinophils Absolute 0.6 (*)    All other components within normal limits   URINALYSIS, ROUTINE W REFLEX MICROSCOPIC - Abnormal; Notable for the following components:   APPearance HAZY (*)    Protein, ur 30 (*)    Nitrite POSITIVE (*)    Leukocytes,Ua MODERATE (*)    WBC, UA >50 (*)    Bacteria, UA FEW (*)    All other components within normal limits  LIPASE, BLOOD - Abnormal; Notable for the following components:   Lipase 64 (*)    All other components within normal limits  RESP PANEL BY RT-PCR (RSV, FLU A&B, COVID)  [id number]    EKG EKG Interpretation  Date/Time:  Sunday July 07 2022 15:00:42 EST Ventricular Rate:  86 PR Interval:  QRS Duration: 81 QT Interval:  355 QTC Calculation: 425 R Axis:   -63 Text Interpretation: Atrial fibrillation Inferior infarct, old Anterior infarct, old No acute changes No significant change since last tracing Confirmed by Varney Biles [phone]) on 07/07/2022 8:02:53 PM  Radiology No results found.  Procedures Procedures    Medications Ordered in ED Medications  oxyCODONE-acetaminophen (PERCOCET/ROXICET) 5-325 MG per tablet 1 tablet (1 tablet Oral Given 07/07/22 1626)    ED Course/ Medical Decision Making/ A&P                           Medical Decision Making 88 year old female comes in with chief complaint of back pain, urinary frequency and blood in the urine.  Patient has history of chronic back pain, but states that yesterday she is having urinary frequency and her back pain got worse.  She has also noted intermittent episodes of blood in the urine.  Patient has burning with urination.  She was seen in the urgent care earlier today, but sent to the emergency room.  Patient had medical screening exam completed by APP.  Appropriate labs were ordered.  Urine analysis independently interpreted.  Positive nitrates, leukocyte esterase, pyuria, bacteriuria and no hematuria noted in the urine.  CBC showed slight leukocytosis.  CMP did not reveal any renal issues. COVID-19/flu/RSV swab was not  completed.  We called for patient's name in the triage, but she did not respond.  I called patient's home number and also her primary contact, which was her brother.  Her brother did pick up the phone and started driving towards his sisters home, when Alexa [name] called me back.  Alexa [name] providing a full history. I informed her that if she comes back, I will be happy to do full assessment.  She states that she has already contacted Dr. Diona Fanti and is expecting a call back for an appointment with him.  She is feeling well right now besides the pain.  She received something for pain while in the ER, and the pain improved and with uncertain wait time she decided to go home.  I reviewed patient's urine cultures from previous.  She had E. coli that was susceptible to Ancef.  We will start him on Keflex.  Patient's brother stated that he will pick up the prescription today from [facility] on [address].  Strict ER return precautions have been discussed, and patient is agreeing with the plan and is comfortable with the workup done and the recommendations from the ER.   Risk Prescription drug management.     Final Clinical Impression(s) / ED Diagnoses Final diagnoses:  Acute cystitis without hematuria    Rx / DC Orders ED Discharge Orders          Ordered    cephALEXin (KEFLEX) 500 MG capsule  3 times daily        07/07/22 1912    phenazopyridine (PYRIDIUM) 200 MG tablet  3 times daily        07/07/22 1912              Varney Biles, MD 07/07/22 2003

## 2022-07-10 DIAGNOSIS — N3000 Acute cystitis without hematuria: Secondary | ICD-10-CM | POA: Diagnosis not present

## 2022-08-21 DIAGNOSIS — H9201 Otalgia, right ear: Secondary | ICD-10-CM | POA: Diagnosis not present

## 2022-08-21 DIAGNOSIS — H60501 Unspecified acute noninfective otitis externa, right ear: Secondary | ICD-10-CM | POA: Diagnosis not present

## 2022-08-21 DIAGNOSIS — H6121 Impacted cerumen, right ear: Secondary | ICD-10-CM | POA: Diagnosis not present

## 2022-09-04 DIAGNOSIS — M25551 Pain in right hip: Secondary | ICD-10-CM | POA: Diagnosis not present

## 2022-09-04 DIAGNOSIS — M545 Low back pain, unspecified: Secondary | ICD-10-CM | POA: Diagnosis not present

## 2022-10-02 DIAGNOSIS — E1169 Type 2 diabetes mellitus with other specified complication: Secondary | ICD-10-CM | POA: Diagnosis not present

## 2022-10-02 DIAGNOSIS — E559 Vitamin D deficiency, unspecified: Secondary | ICD-10-CM | POA: Diagnosis not present

## 2022-10-02 DIAGNOSIS — M810 Age-related osteoporosis without current pathological fracture: Secondary | ICD-10-CM | POA: Diagnosis not present

## 2022-10-02 DIAGNOSIS — R634 Abnormal weight loss: Secondary | ICD-10-CM | POA: Diagnosis not present

## 2022-10-02 DIAGNOSIS — R2681 Unsteadiness on feet: Secondary | ICD-10-CM | POA: Diagnosis not present

## 2022-10-02 DIAGNOSIS — L309 Dermatitis, unspecified: Secondary | ICD-10-CM | POA: Diagnosis not present

## 2022-10-02 DIAGNOSIS — E441 Mild protein-calorie malnutrition: Secondary | ICD-10-CM | POA: Diagnosis not present

## 2022-10-02 DIAGNOSIS — E1122 Type 2 diabetes mellitus with diabetic chronic kidney disease: Secondary | ICD-10-CM | POA: Diagnosis not present

## 2022-10-02 DIAGNOSIS — I10 Essential (primary) hypertension: Secondary | ICD-10-CM | POA: Diagnosis not present

## 2022-10-02 DIAGNOSIS — Z85528 Personal history of other malignant neoplasm of kidney: Secondary | ICD-10-CM | POA: Diagnosis not present

## 2022-10-02 DIAGNOSIS — F0390 Unspecified dementia without behavioral disturbance: Secondary | ICD-10-CM | POA: Diagnosis not present

## 2022-10-02 DIAGNOSIS — F419 Anxiety disorder, unspecified: Secondary | ICD-10-CM | POA: Diagnosis not present

## 2022-10-02 DIAGNOSIS — M549 Dorsalgia, unspecified: Secondary | ICD-10-CM | POA: Diagnosis not present

## 2022-10-02 DIAGNOSIS — N390 Urinary tract infection, site not specified: Secondary | ICD-10-CM | POA: Diagnosis not present

## 2022-10-02 DIAGNOSIS — K5900 Constipation, unspecified: Secondary | ICD-10-CM | POA: Diagnosis not present

## 2022-10-02 DIAGNOSIS — D649 Anemia, unspecified: Secondary | ICD-10-CM | POA: Diagnosis not present

## 2022-10-02 DIAGNOSIS — E611 Iron deficiency: Secondary | ICD-10-CM | POA: Diagnosis not present

## 2022-10-15 DIAGNOSIS — N390 Urinary tract infection, site not specified: Secondary | ICD-10-CM | POA: Diagnosis not present

## 2022-10-15 DIAGNOSIS — Z79899 Other long term (current) drug therapy: Secondary | ICD-10-CM | POA: Diagnosis not present

## 2022-10-17 ENCOUNTER — Emergency Department (HOSPITAL_COMMUNITY)
Admission: EM | Admit: 2022-10-17 | Discharge: 2022-10-18 | Disposition: A | Discharge status: Home / Self Care | Payer: Medicare HMO | Attending: Emergency Medicine | Admitting: Emergency Medicine

## 2022-10-17 ENCOUNTER — Emergency Department (HOSPITAL_COMMUNITY): Payer: Medicare HMO

## 2022-10-17 DIAGNOSIS — R0602 Shortness of breath: Secondary | ICD-10-CM | POA: Diagnosis not present

## 2022-10-17 DIAGNOSIS — R0902 Hypoxemia: Secondary | ICD-10-CM | POA: Diagnosis not present

## 2022-10-17 DIAGNOSIS — R531 Weakness: Secondary | ICD-10-CM | POA: Diagnosis not present

## 2022-10-17 DIAGNOSIS — R42 Dizziness and giddiness: Secondary | ICD-10-CM | POA: Diagnosis not present

## 2022-10-17 DIAGNOSIS — K5903 Drug induced constipation: Secondary | ICD-10-CM | POA: Diagnosis not present

## 2022-10-17 DIAGNOSIS — K8689 Other specified diseases of pancreas: Secondary | ICD-10-CM | POA: Diagnosis not present

## 2022-10-17 DIAGNOSIS — I10 Essential (primary) hypertension: Secondary | ICD-10-CM | POA: Insufficient documentation

## 2022-10-17 DIAGNOSIS — R109 Unspecified abdominal pain: Secondary | ICD-10-CM | POA: Insufficient documentation

## 2022-10-17 DIAGNOSIS — I4891 Unspecified atrial fibrillation: Secondary | ICD-10-CM | POA: Diagnosis not present

## 2022-10-17 DIAGNOSIS — K5900 Constipation, unspecified: Secondary | ICD-10-CM | POA: Diagnosis not present

## 2022-10-17 DIAGNOSIS — K5730 Diverticulosis of large intestine without perforation or abscess without bleeding: Secondary | ICD-10-CM | POA: Diagnosis not present

## 2022-10-17 LAB — COMPREHENSIVE METABOLIC PANEL
ALT: 14 U/L (ref 0–44)
AST: 23 U/L (ref 15–41)
Albumin: 4.3 g/dL (ref 3.5–5.0)
Alkaline Phosphatase: 64 U/L (ref 38–126)
Anion gap: 12 (ref 5–15)
BUN: 23 mg/dL (ref 8–23)
CO2: 22 mmol/L (ref 22–32)
Calcium: 9.2 mg/dL (ref 8.9–10.3)
Chloride: 105 mmol/L (ref 98–111)
Creatinine, Ser: 0.84 mg/dL (ref 0.44–1.00)
GFR, Estimated: >60 mL/min (ref >60)
Glucose, Bld: 141 mg/dL — ABNORMAL HIGH (ref 70–99)
Potassium: 3.9 mmol/L (ref 3.5–5.1)
Sodium: 139 mmol/L (ref 135–145)
Total Bilirubin: 1.1 mg/dL (ref 0.3–1.2)
Total Protein: 7.9 g/dL (ref 6.5–8.1)

## 2022-10-17 LAB — URINALYSIS, ROUTINE W REFLEX MICROSCOPIC
Bacteria, UA: NONE SEEN
Bilirubin Urine: NEGATIVE
Glucose, UA: NEGATIVE mg/dL
Ketones, ur: NEGATIVE mg/dL
Leukocytes,Ua: NEGATIVE
Nitrite: NEGATIVE
Protein, ur: 30 mg/dL — AB
Specific Gravity, Urine: 1.002 — ABNORMAL LOW (ref 1.005–1.030)
pH: 7 (ref 5.0–8.0)

## 2022-10-17 LAB — CBC WITH DIFFERENTIAL/PLATELET
Abs Immature Granulocytes: 0.04 10*3/uL (ref 0.00–0.07)
Basophils Absolute: 0.1 10*3/uL (ref 0.0–0.1)
Basophils Relative: 1 %
Eosinophils Absolute: 0.5 10*3/uL (ref 0.0–0.5)
Eosinophils Relative: 5 %
HCT: 48.7 % — ABNORMAL HIGH (ref 36.0–46.0)
Hemoglobin: 16 g/dL — ABNORMAL HIGH (ref 12.0–15.0)
Immature Granulocytes: 0 %
Lymphocytes Relative: 24 %
Lymphs Abs: 2.5 10*3/uL (ref 0.7–4.0)
MCH: 29.5 pg (ref 26.0–34.0)
MCHC: 32.9 g/dL (ref 30.0–36.0)
MCV: 89.9 fL (ref 80.0–100.0)
Monocytes Absolute: 0.8 10*3/uL (ref 0.1–1.0)
Monocytes Relative: 7 %
Neutro Abs: 6.8 10*3/uL (ref 1.7–7.7)
Neutrophils Relative %: 63 %
Platelets: 323 10*3/uL (ref 150–400)
RBC: 5.42 MIL/uL — ABNORMAL HIGH (ref 3.87–5.11)
RDW: 13.6 % (ref 11.5–15.5)
WBC: 10.7 10*3/uL — ABNORMAL HIGH (ref 4.0–10.5)
nRBC: 0 % (ref 0.0–0.2)

## 2022-10-17 LAB — LACTIC ACID, PLASMA: Lactic Acid, Venous: 0.7 mmol/L (ref 0.5–1.9)

## 2022-10-17 MED ORDER — SODIUM CHLORIDE 0.9 % IV BOLUS
1000.0000 mL | Freq: Once | INTRAVENOUS | Status: AC
  Administered 2022-10-17: 1000 mL via INTRAVENOUS

## 2022-10-17 NOTE — ED Triage Notes (Signed)
Pt here from home. Pt has been having frequent urination. Pt reports being dx with UTI  earlier this week and having increasing weakness. Pt able to get to the commode upon arrival. Pt reports dizzyness with standing.  Bp-180/100  Pulse 90 Spo2 98 Cbg 138

## 2022-10-17 NOTE — ED Provider Notes (Incomplete)
[place] EMERGENCY DEPARTMENT AT [facility] Provider Note   CSN: [id number] Arrival date & time: 10/17/22  2121     History  Chief Complaint  Patient presents with  . Weakness  . Urinary Frequency    Alexa [name] is a 89 y.o. female with no documented medical history per chart.  Patient presents to ED for evaluation of weakness, lightheadedness.  Patient reports that she was diagnosed with kidney infection 2 days ago, placed on appropriate antibiotic medication at that time, Macrobid.  Patient states that she has taken her medication as prescribed for the last 2 days.  Patient reports that she is still suffering from urinary frequency however denies dysuria.  Patient states that tonight she went to use the bathroom, got to the commode and sat down and then became very lightheaded.  Patient reports that feeling persisted and then she became very weak.  Patient reports that she was able to get up off of the commode, go to the couch.  Patient reports that her granddaughter came over at this time and her dizziness persisted for about 1 hour.  Patient reports that EMS came at this time due to her weakness, checked her blood pressure and noted to be elevated.  Patient states she has history of hypertension, is compliant on medication.  Patient denies chest pain, headache, blurry vision.  Patient reports she would become very short of breath during her episode of weakness.  Patient reports of my examination she feels better at this time.  Patient states that she did not wish to come to the hospital, she was advised to by her loved ones.  Patient denies fevers, nausea, vomiting, diarrhea, chest pain, headache, blurred vision.   Weakness Associated symptoms: frequency   Urinary Frequency       Home Medications Prior to Admission medications   Not on File      Allergies    Patient has no allergy information on record.    Review of Systems   Review of Systems  Genitourinary:   Positive for frequency.  Neurological:  Positive for weakness.    Physical Exam Updated Vital Signs BP (!) 176/91 (BP Location: Right Arm)   Pulse (!) 105   Temp 98.1 F (36.7 C) (Oral)   Resp 18   Ht 5\' 1"  (1.549 m)   Wt 52.2 kg   SpO2 96%   BMI 21.73 kg/m  Physical Exam  ED Results / Procedures / Treatments   Labs (all labs ordered are listed, but only abnormal results are displayed) Labs Reviewed  URINALYSIS, ROUTINE W REFLEX MICROSCOPIC - Abnormal; Notable for the following components:      Result Value   Color, Urine STRAW (*)    Specific Gravity, Urine 1.002 (*)    Hgb urine dipstick MODERATE (*)    Protein, ur 30 (*)    All other components within normal limits  CBC WITH DIFFERENTIAL/PLATELET - Abnormal; Notable for the following components:   WBC 10.7 (*)    RBC 5.42 (*)    Hemoglobin 16.0 (*)    HCT 48.7 (*)    All other components within normal limits  COMPREHENSIVE METABOLIC PANEL - Abnormal; Notable for the following components:   Glucose, Bld 141 (*)    All other components within normal limits  URINE CULTURE  LACTIC ACID, PLASMA  LACTIC ACID, PLASMA    EKG None  Radiology No results found.  Procedures Procedures  {Document cardiac monitor, telemetry assessment procedure when appropriate:1}  Medications Ordered in ED Medications  sodium chloride 0.9 % bolus 1,000 mL (has no administration in time range)    ED Course/ Medical Decision Making/ A&P   {   Click here for ABCD2, HEART and other calculatorsREFRESH Note before signing :1}                          Medical Decision Making Amount and/or Complexity of Data Reviewed Labs: ordered. Radiology: ordered.   ***  {Document critical care time when appropriate:1} {Document review of labs and clinical decision tools ie heart score, Chads2Vasc2 etc:1}  {Document your independent review of radiology images, and any outside records:1} {Document your discussion with family members,  caretakers, and with consultants:1} {Document social determinants of health affecting pt's care:1} {Document your decision making why or why not admission, treatments were needed:1} Final Clinical Impression(s) / ED Diagnoses Final diagnoses:  None    Rx / DC Orders ED Discharge Orders     None

## 2022-10-17 NOTE — ED Provider Notes (Signed)
[facility] Provider Note   CSN: [id number] Arrival date & time: 10/17/22  2121     History  Chief Complaint  Patient presents with   Weakness   Urinary Frequency    Alexa [name] is a 89 y.o. female with no documented medical history per chart.  Patient presents to ED for evaluation of weakness, lightheadedness.  Patient reports that she was diagnosed with kidney infection 2 days ago, placed on appropriate antibiotic medication at that time, Macrobid.  Patient states that she has taken her medication as prescribed for the last 2 days.  Patient reports that she is still suffering from urinary frequency however denies dysuria.  Patient states that tonight she went to use the bathroom, got to the commode and sat down and then became very lightheaded.  Patient reports that feeling persisted and then she became very weak.  Patient reports that she was able to get up off of the commode, go to the couch.  Patient reports that her granddaughter came over at this time and her dizziness persisted for about 1 hour.  Patient reports that EMS came at this time due to her weakness, checked her blood pressure and noted to be elevated.  Patient states she has history of hypertension, is unsure of what medication she is supposed to be taking.  Patient denies chest pain, headache, blurry vision.  Patient reports she did become very short of breath during her episode of weakness however goes on to add she is no longer short of breath.  Patient reports on my examination she feels better at this time.  Patient states that she did not wish to come to the hospital, she was advised to by her loved ones.  Patient denies fevers, nausea, vomiting, diarrhea, chest pain, headache, blurred vision.   Weakness Associated symptoms: frequency and shortness of breath   Associated symptoms: no chest pain, no dysuria and no fever   Urinary Frequency Associated symptoms include  shortness of breath. Pertinent negatives include no chest pain.       Home Medications Prior to Admission medications   Medication Sig Start Date End Date Taking? Authorizing Provider  sodium phosphate (FLEET) 7-19 GM/118ML ENEM Place 133 mLs (1 enema total) rectally once for 1 dose. 10/18/22 10/18/22 Yes Azucena Cecil, PA-C      Allergies    Patient has no allergy information on record.    Review of Systems   Review of Systems  Constitutional:  Negative for fever.  Respiratory:  Positive for shortness of breath.   Cardiovascular:  Negative for chest pain.  Genitourinary:  Positive for frequency. Negative for dysuria.  Neurological:  Positive for weakness and light-headedness.  All other systems reviewed and are negative.   Physical Exam Updated Vital Signs BP (!) 183/102   Pulse 85   Temp 98 F (36.7 C)   Resp (!) 22   Ht 5\' 1"  (1.549 m)   Wt 52.2 kg   LMP  (LMP Unknown)   SpO2 92%   BMI 21.73 kg/m  Physical Exam Vitals and nursing note reviewed.  Constitutional:      General: She is not in acute distress.    Appearance: Normal appearance. She is not ill-appearing, toxic-appearing or diaphoretic.  HENT:     Head: Normocephalic and atraumatic.     Nose: Nose normal.     Mouth/Throat:     Mouth: Mucous membranes are moist.     Pharynx: Oropharynx is  clear.  Eyes:     Extraocular Movements: Extraocular movements intact.     Conjunctiva/sclera: Conjunctivae normal.     Pupils: Pupils are equal, round, and reactive to light.  Cardiovascular:     Pulses: Normal pulses.     Heart sounds: No murmur heard. Pulmonary:     Effort: Pulmonary effort is normal.     Breath sounds: Normal breath sounds. No wheezing.  Abdominal:     General: Abdomen is flat. Bowel sounds are normal.     Tenderness: There is abdominal tenderness.  Musculoskeletal:     Cervical back: Normal range of motion and neck supple. No tenderness.  Skin:    General: Skin is warm and dry.      Capillary Refill: Capillary refill takes less than 2 seconds.  Neurological:     General: No focal deficit present.     Mental Status: She is alert and oriented to person, place, and time.     GCS: GCS eye subscore is 4. GCS verbal subscore is 5. GCS motor subscore is 6.     Cranial Nerves: Cranial nerves 2-12 are intact. No cranial nerve deficit.     Sensory: Sensation is intact. No sensory deficit.     Motor: Motor function is intact. No weakness.     ED Results / Procedures / Treatments   Labs (all labs ordered are listed, but only abnormal results are displayed) Labs Reviewed  URINALYSIS, ROUTINE W REFLEX MICROSCOPIC - Abnormal; Notable for the following components:      Result Value   Color, Urine STRAW (*)    Specific Gravity, Urine 1.002 (*)    Hgb urine dipstick MODERATE (*)    Protein, ur 30 (*)    All other components within normal limits  CBC WITH DIFFERENTIAL/PLATELET - Abnormal; Notable for the following components:   WBC 10.7 (*)    RBC 5.42 (*)    Hemoglobin 16.0 (*)    HCT 48.7 (*)    All other components within normal limits  COMPREHENSIVE METABOLIC PANEL - Abnormal; Notable for the following components:   Glucose, Bld 141 (*)    All other components within normal limits  URINE CULTURE  LACTIC ACID, PLASMA  LACTIC ACID, PLASMA    EKG None  Radiology CT Head Wo Contrast  Result Date: 10/18/2022 CLINICAL DATA:  Right leg weakness EXAM: CT HEAD WITHOUT CONTRAST TECHNIQUE: Contiguous axial images were obtained from the base of the skull through the vertex without intravenous contrast. RADIATION DOSE REDUCTION: This exam was performed according to the departmental dose-optimization program which includes automated exposure control, adjustment of the mA and/or kV according to patient size and/or use of iterative reconstruction technique. COMPARISON:  12/14/2021 FINDINGS: Brain: Normal anatomic configuration. Parenchymal volume loss is commensurate with the  patient's age. Moderate periventricular white matter changes are present likely reflecting the sequela of small vessel ischemia. 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 No abnormal mass effect or midline shift. No evidence of acute intracranial hemorrhage or infarct. Ventricular size is normal. Cerebellum unremarkable. Vascular: No asymmetric hyperdense vasculature at the skull base. Skull: Intact Sinuses/Orbits: Paranasal sinuses are clear. Orbits are unremarkable. Other: Mastoid air cells and middle ear cavities are clear. IMPRESSION: 1. No acute intracranial hemorrhage or infarct. 2. 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 3. Stable senescent change. Electronically Signed   By: Fidela Salisbury  M.D.   On: 10/18/2022 02:45   CT Angio Chest PE W and/or Wo Contrast  Result Date: 10/18/2022 CLINICAL DATA:  Abdominal pain, acute, nonlocalized; Pulmonary embolism (PE) suspected, high prob, progressive weakness, polyuria EXAM: CT ANGIOGRAPHY CHEST CT ABDOMEN AND PELVIS WITH CONTRAST TECHNIQUE: Multidetector CT imaging of the chest was performed using the standard protocol during bolus administration of intravenous contrast. Multiplanar CT image reconstructions and MIPs were obtained to evaluate the vascular anatomy. Multidetector CT imaging of the abdomen and pelvis was performed using the standard protocol during bolus administration of intravenous contrast. RADIATION DOSE REDUCTION: This exam was performed according to the departmental dose-optimization program which includes automated exposure control, adjustment of the mA and/or kV according to patient size and/or use of iterative reconstruction technique. CONTRAST:  97mL OMNIPAQUE IOHEXOL 350 MG/ML SOLN COMPARISON:  CT chest 09/04/2016, CT abdomen pelvis 02/05/2012 FINDINGS: CTA CHEST FINDINGS Cardiovascular:  Adequate opacification of the pulmonary arterial tree. No intraluminal filling defect identified to suggest acute pulmonary embolism. Central pulmonary arteries are of normal caliber. No significant coronary artery calcification. Cardiac size within normal limits. No pericardial effusion. Mild atherosclerotic calcification within the thoracic aorta. No aortic aneurysm. Mediastinum/Nodes: No enlarged mediastinal, hilar, or axillary lymph nodes. Thyroid gland, trachea, and esophagus demonstrate no significant findings. Lungs/Pleura: Subpleural interstitial changes more prevalent within the lung bases bilaterally in keeping with mild fibrotic change. Lungs are otherwise clear. No pneumothorax or pleural effusion. No central obstructing lesion. Musculoskeletal: Osseous structures are age-appropriate. No acute bone abnormality. No lytic or blastic bone lesion. Review of the MIP images confirms the above findings. CT ABDOMEN and PELVIS FINDINGS Hepatobiliary: No focal liver abnormality is seen. No gallstones, gallbladder wall thickening, or biliary dilatation. Pancreas: Interval development of a a 14 mm cystic lesion within the head of the pancreas, axial image # 34/3, indeterminate, possibly representing a small intrapancreatic pseudocyst or developing cystic neoplasm. The pancreas is otherwise unremarkable. Spleen: Unremarkable Adrenals/Urinary Tract: The adrenal glands are unremarkable. The right kidney is normal. Status post left nephrectomy. The bladder is partially obscured by streak artifact, however, the visualized portion is unremarkable. Stomach/Bowel: Moderate colonic stool burden without evidence of obstruction. Moderate sigmoid diverticulosis without superimposed acute inflammatory change. The stomach, small bowel, and large bowel are otherwise unremarkable. Appendix normal. No free intraperitoneal gas or fluid. Vascular/Lymphatic: Aortic atherosclerosis. No enlarged abdominal or pelvic lymph nodes.  Reproductive: Uterus and bilateral adnexa are unremarkable. Other: Tiny fat containing umbilical hernia. Musculoskeletal: Status post right total hip arthroplasty. Interval development of a remote appearing compression deformity of T12 with mild retropulsion of the posterosuperior aspect of the vertebral body and approximately 50% loss of height. No acute bone abnormality within the abdomen and pelvis. Advanced degenerative changes are seen within the lumbar spine. No lytic or blastic bone lesion is seen. Review of the MIP images confirms the above findings. IMPRESSION: 1. No pulmonary embolism. No acute intrathoracic pathology identified. 2. Interval development of a 14 mm cystic lesion within the head of the pancreas, indeterminate, possibly representing a small intrapancreatic pseudocyst or developing cystic neoplasm. If indicated, follow-up MRI examination could be performed in 2 years and 4 years to document stability and confirm an underlying benign etiology. 3. Moderate colonic stool burden without evidence of obstruction. 4. Moderate sigmoid diverticulosis without superimposed acute inflammatory change. 5. Status post left nephrectomy. 6. Remote T12 compression deformity. Electronically Signed   By: Fidela Salisbury M.D.   On: 10/18/2022 02:08   CT ABDOMEN PELVIS W CONTRAST  Result  Date: 10/18/2022 CLINICAL DATA:  Abdominal pain, acute, nonlocalized; Pulmonary embolism (PE) suspected, high prob, progressive weakness, polyuria EXAM: CT ANGIOGRAPHY CHEST CT ABDOMEN AND PELVIS WITH CONTRAST TECHNIQUE: Multidetector CT imaging of the chest was performed using the standard protocol during bolus administration of intravenous contrast. Multiplanar CT image reconstructions and MIPs were obtained to evaluate the vascular anatomy. Multidetector CT imaging of the abdomen and pelvis was performed using the standard protocol during bolus administration of intravenous contrast. RADIATION DOSE REDUCTION: This exam was  performed according to the departmental dose-optimization program which includes automated exposure control, adjustment of the mA and/or kV according to patient size and/or use of iterative reconstruction technique. CONTRAST:  45mL OMNIPAQUE IOHEXOL 350 MG/ML SOLN COMPARISON:  CT chest 09/04/2016, CT abdomen pelvis 02/05/2012 FINDINGS: CTA CHEST FINDINGS Cardiovascular: Adequate opacification of the pulmonary arterial tree. No intraluminal filling defect identified to suggest acute pulmonary embolism. Central pulmonary arteries are of normal caliber. No significant coronary artery calcification. Cardiac size within normal limits. No pericardial effusion. Mild atherosclerotic calcification within the thoracic aorta. No aortic aneurysm. Mediastinum/Nodes: No enlarged mediastinal, hilar, or axillary lymph nodes. Thyroid gland, trachea, and esophagus demonstrate no significant findings. Lungs/Pleura: Subpleural interstitial changes more prevalent within the lung bases bilaterally in keeping with mild fibrotic change. Lungs are otherwise clear. No pneumothorax or pleural effusion. No central obstructing lesion. Musculoskeletal: Osseous structures are age-appropriate. No acute bone abnormality. No lytic or blastic bone lesion. Review of the MIP images confirms the above findings. CT ABDOMEN and PELVIS FINDINGS Hepatobiliary: No focal liver abnormality is seen. No gallstones, gallbladder wall thickening, or biliary dilatation. Pancreas: Interval development of a a 14 mm cystic lesion within the head of the pancreas, axial image # 34/3, indeterminate, possibly representing a small intrapancreatic pseudocyst or developing cystic neoplasm. The pancreas is otherwise unremarkable. Spleen: Unremarkable Adrenals/Urinary Tract: The adrenal glands are unremarkable. The right kidney is normal. Status post left nephrectomy. The bladder is partially obscured by streak artifact, however, the visualized portion is unremarkable.  Stomach/Bowel: Moderate colonic stool burden without evidence of obstruction. Moderate sigmoid diverticulosis without superimposed acute inflammatory change. The stomach, small bowel, and large bowel are otherwise unremarkable. Appendix normal. No free intraperitoneal gas or fluid. Vascular/Lymphatic: Aortic atherosclerosis. No enlarged abdominal or pelvic lymph nodes. Reproductive: Uterus and bilateral adnexa are unremarkable. Other: Tiny fat containing umbilical hernia. Musculoskeletal: Status post right total hip arthroplasty. Interval development of a remote appearing compression deformity of T12 with mild retropulsion of the posterosuperior aspect of the vertebral body and approximately 50% loss of height. No acute bone abnormality within the abdomen and pelvis. Advanced degenerative changes are seen within the lumbar spine. No lytic or blastic bone lesion is seen. Review of the MIP images confirms the above findings. IMPRESSION: 1. No pulmonary embolism. No acute intrathoracic pathology identified. 2. Interval development of a 14 mm cystic lesion within the head of the pancreas, indeterminate, possibly representing a small intrapancreatic pseudocyst or developing cystic neoplasm. If indicated, follow-up MRI examination could be performed in 2 years and 4 years to document stability and confirm an underlying benign etiology. 3. Moderate colonic stool burden without evidence of obstruction. 4. Moderate sigmoid diverticulosis without superimposed acute inflammatory change. 5. Status post left nephrectomy. 6. Remote T12 compression deformity. Electronically Signed   By: Fidela Salisbury M.D.   On: 10/18/2022 02:08   DG Chest 2 View  Result Date: 10/17/2022 CLINICAL DATA:  Shortness of breath EXAM: CHEST - 2 VIEW COMPARISON:  12/14/2021 FINDINGS: Heart and  mediastinal contours are within normal limits. No focal opacities or effusions. No acute bony abnormality. Tortuous, calcified aorta. IMPRESSION: No active  cardiopulmonary disease. Electronically Signed   By: Rolm Baptise M.D.   On: 10/17/2022 22:44    Procedures Procedures   Medications Ordered in ED Medications  sodium chloride (PF) 0.9 % injection (0 mLs  Hold 10/18/22 0209)  sodium chloride 0.9 % bolus 1,000 mL (0 mLs Intravenous Stopped 10/18/22 0413)  iohexol (OMNIPAQUE) 350 MG/ML injection 75 mL (75 mLs Intravenous Contrast Given 10/18/22 0131)  HYDROcodone-acetaminophen (NORCO/VICODIN) 5-325 MG per tablet 1 tablet (1 tablet Oral Given 10/18/22 0409)    ED Course/ Medical Decision Making/ A&P  Medical Decision Making Amount and/or Complexity of Data Reviewed Labs: ordered. Radiology: ordered.  Risk Prescription drug management.   89 year old female presents to the ED for evaluation.  Please see HPI for further details.  On examination the patient is afebrile.  Lung sounds are clear bilaterally, she is not hypoxic.  The abdomen is soft and compressible however the patient does have nonfocal abdominal tenderness.  Neurological examination at baseline without focal neurodeficits, patient alert and oriented x 3.  Patient CBC shows elevated white blood cell count to 10.7, elevated hemoglobin.  Patient CMP unremarkable without electrolyte derangement.  Patient urinalysis shows moderate hemoglobin however no nitrite positive urine.  Will culture patient urine as she reports that she was recently placed on antibiotics secondary to UTI.  Patient lactic acid not elevated at 1.7, secondary lactic acid decreased to 1.0 after 1 L of fluid administered.  Patient chest x-ray shows no pathologies or effusions.  Patient CT head shows no evidence of intracranial abnormality.  Patient CT angio shows no evidence of PE.  The patient CT abdomen pelvis with contrast shows an interval development of a 14 mm cystic lesion within the head of the pancreas, indeterminate however possibly representing a small intrapancreatic pseudocyst or developing cystic neoplasm.   Radiology recommends follow-up MRI in 2 years and 4 years to assess.  There is also noted to be a moderate colonic stool burden without evidence of obstruction as well as moderate sigmoid diverticulosis without superimposed acute inflammatory change or diverticulitis.  Patient EKG is nonischemic.  Patient provided hydrocodone for pain.  At this time, patient workup unremarkable.  The patient was sent home at this time and advised to follow-up with her PCP.  Patient advised to continue taking blood pressure medication at home as prescribed.  Patient advised to return to the ED with any new or worsening signs or symptoms such as chest pain, headache, blurred vision or shortness of breath.  The patient case was discussed with my attending Dr. Ayesha Rumpf who voices agreement with plan of management.  Patient provided opportunity to ask questions, all questions answered to her satisfaction.  The patient is stable for discharge.   Final Clinical Impression(s) / ED Diagnoses Final diagnoses:  Generalized weakness  Drug-induced constipation    Rx / DC Orders ED Discharge Orders          Ordered    sodium phosphate (FLEET) 7-19 GM/118ML ENEM   Once        10/18/22 0422              Azucena Cecil, PA-C 10/18/22 0425    Drenda Freeze, MD 10/18/22 [phone]

## 2022-10-18 ENCOUNTER — Encounter (HOSPITAL_COMMUNITY): Payer: Self-pay

## 2022-10-18 ENCOUNTER — Emergency Department (HOSPITAL_COMMUNITY): Payer: Medicare HMO

## 2022-10-18 ENCOUNTER — Encounter (HOSPITAL_COMMUNITY): Payer: Self-pay | Admitting: Orthopedic Surgery

## 2022-10-18 DIAGNOSIS — R531 Weakness: Secondary | ICD-10-CM | POA: Diagnosis not present

## 2022-10-18 DIAGNOSIS — K8689 Other specified diseases of pancreas: Secondary | ICD-10-CM | POA: Diagnosis not present

## 2022-10-18 DIAGNOSIS — K5730 Diverticulosis of large intestine without perforation or abscess without bleeding: Secondary | ICD-10-CM | POA: Diagnosis not present

## 2022-10-18 LAB — URINE CULTURE: Culture: NO GROWTH

## 2022-10-18 LAB — LACTIC ACID, PLASMA: Lactic Acid, Venous: 1 mmol/L (ref 0.5–1.9)

## 2022-10-18 MED ORDER — FLEET ENEMA 7-19 GM/118ML RE ENEM: 1.0000 | ENEMA | Freq: Once | RECTAL | 0 refills | Status: AC

## 2022-10-18 MED ORDER — SODIUM CHLORIDE (PF) 0.9 % IJ SOLN
INTRAMUSCULAR | Status: AC
  Filled 2022-10-18: qty 50

## 2022-10-18 MED ORDER — IOHEXOL 350 MG/ML SOLN
75.0000 mL | Freq: Once | INTRAVENOUS | Status: AC | PRN
  Administered 2022-10-18: 75 mL via INTRAVENOUS

## 2022-10-18 MED ORDER — HYDROCODONE-ACETAMINOPHEN 5-325 MG PO TABS
1.0000 | ORAL_TABLET | Freq: Once | ORAL | Status: AC
  Administered 2022-10-18: 1 via ORAL
  Filled 2022-10-18: qty 1

## 2022-10-18 NOTE — ED Notes (Signed)
Patient refused to call ambulance to take her home. Pt stated I don't have the money and its too expensive. Pt requested to wait for a little bit until her brother and son will wake up and pick her up.

## 2022-10-18 NOTE — ED Notes (Signed)
Pt states she believes her family is sleeping and will continue trying to reach them. Pt refuses PTAR at this time. Pt has no complaints. Will continue to monitor.

## 2022-10-18 NOTE — ED Notes (Signed)
Pt brother,. Edd Arbour, is bedside with patient.

## 2022-10-18 NOTE — ED Notes (Signed)
An After Visit Summary was printed and given to the patient. Discharge instructions given and no further questions at this time.  Pt leaving with her brother, Edd Arbour.

## 2022-10-18 NOTE — ED Notes (Signed)
Pt son called by this RN, still no answer. Pt brother called again, and per Edd Arbour, pt brother, he is coming to pick her up.

## 2022-10-18 NOTE — Discharge Instructions (Addendum)
Return to the ED with any new or worsening signs or symptoms such as chest pain, shortness of breath, headache or blurred vision Please follow-up with your PCP regarding mass on pancreas.  You will need follow-up imaging, most likely through MRI.  Please also follow-up with PCP regarding blood pressure. Please continue taking MiraLAX at home for constipation.  He may also utilize Fleet enema I prescribed you. Please continue taking your blood pressure medication as prescribed. Please read the attached guide concerning weakness

## 2022-10-18 NOTE — ED Notes (Signed)
Patient still unable to reach family. Charge RN informed and she's ok for the patient to stay for a little bit until she can contact her family.

## 2022-10-18 NOTE — ED Notes (Signed)
Pt son called by this RN, no answer. Pt brother called by this RN, no answer.

## 2022-10-30 DIAGNOSIS — K869 Disease of pancreas, unspecified: Secondary | ICD-10-CM | POA: Diagnosis not present

## 2022-10-30 DIAGNOSIS — K5900 Constipation, unspecified: Secondary | ICD-10-CM | POA: Diagnosis not present

## 2022-10-30 DIAGNOSIS — M1990 Unspecified osteoarthritis, unspecified site: Secondary | ICD-10-CM | POA: Diagnosis not present

## 2022-10-30 DIAGNOSIS — I10 Essential (primary) hypertension: Secondary | ICD-10-CM | POA: Diagnosis not present

## 2022-10-30 DIAGNOSIS — R55 Syncope and collapse: Secondary | ICD-10-CM | POA: Diagnosis not present

## 2022-10-30 DIAGNOSIS — E119 Type 2 diabetes mellitus without complications: Secondary | ICD-10-CM | POA: Diagnosis not present

## 2022-12-10 DIAGNOSIS — R5383 Other fatigue: Secondary | ICD-10-CM | POA: Diagnosis not present

## 2022-12-10 DIAGNOSIS — I10 Essential (primary) hypertension: Secondary | ICD-10-CM | POA: Diagnosis not present

## 2022-12-10 DIAGNOSIS — I491 Atrial premature depolarization: Secondary | ICD-10-CM | POA: Diagnosis not present

## 2022-12-10 DIAGNOSIS — R0609 Other forms of dyspnea: Secondary | ICD-10-CM | POA: Diagnosis not present

## 2023-01-26 NOTE — Progress Notes (Deleted)
Cardiology Office Note   Date:  01/26/2023   ID:  Alexa [name], DOB 02/24/1930, MRN [id number]  PCP:  Charlane Ferretti, DO  Cardiologist:   None Referring:  ***  No chief complaint on file.     History of Present Illness: Alexa [name] is a 89 y.o. female who presents for evaluation of shortness of breath.  I saw her previously in 2012.  There is a questionable history of syncope in the distant past.  She had a negative perfusion study.  2011 she had some chest discomfort again with a negative perfusion study.  Since I last saw her she did have an echocardiogram ordered by a primary provider and had moderate concentric left ventricular perjury with a well-preserved ejection fraction.  This was in 2018.   Past Medical History:  Diagnosis Date   Angio-edema    Asthma    Chronic obstructive pulmonary disease (HCC)    Diverticulosis    Diverticulosis of colon (without mention of hemorrhage)    Eczema    Esophageal reflux    History of colonic polyps 06/05/05   hyperplastic   Internal hemorrhoids    Irritable bowel syndrome    Ischemic colitis (HCC)    Malignant neoplasm of kidney and other and unspecified urinary organs    renal carcinoma, left nephrectomy in 2008-per patient.   Other and unspecified hyperlipidemia    Personal history of venous thrombosis and embolism    PTE after nephrectomy   Spinal stenosis    Type II or unspecified type diabetes mellitus without mention of complication, not stated as uncontrolled 02/05/2012   pt states it was in 2007   Unspecified essential hypertension    Urticaria     Past Surgical History:  Procedure Laterality Date   ADENOIDECTOMY     BREAST BIOPSY     COLONOSCOPY W/ POLYPECTOMY  2000   COLONOSCOPY W/ POLYPECTOMY  07/2004   Hyperplastic polyps   DILATION AND CURETTAGE OF UTERUS  03/2005   Uterine Polyps   NEPHRECTOMY  2007   right,Dr Dahlstedt   SEPTOPLASTY     TONSILLECTOMY     TOTAL HIP ARTHROPLASTY Right 12/16/2021    Procedure: TOTAL HIP ARTHROPLASTY ANTERIOR APPROACH;  Surgeon: Samson Frederic, MD;  Location: [facility];  Service: Orthopedics;  Laterality: Right;     Current Outpatient Medications  Medication Sig Dispense Refill   apixaban (ELIQUIS) 2.5 MG TABS tablet Take 1 tablet (2.5 mg total) by mouth 2 (two) times daily. 60 tablet 0   Benzalkonium Chloride (DIABETIC BASICS HEALTHY FOOT EX) Apply 1 application topically daily as needed (foot pain). OTC diabetic foot cream     CRANBERRY PO Take 2 capsules by mouth 2 (two) times daily. 42mg      ESTRACE VAGINAL 0.1 MG/GM vaginal cream Apply locally every other night at bedtime  1   hydrOXYzine (ATARAX) 10 MG tablet Take 10 mg by mouth daily as needed for itching.     Multiple Vitamins-Minerals (EYE VITAMINS PO) Take by mouth. AREDS2     Vitamin D, Ergocalciferol, (DRISDOL) 1.25 MG (50000 UNIT) CAPS capsule Take 50,000 Units by mouth once a week.     No current facility-administered medications for this visit.    Allergies:   Lisinopril, Codeine, and Verapamil    Social History:  The patient  reports that she does not have a smoking history on file. She has never used smokeless tobacco. She reports that she does not drink alcohol and does not  use drugs.   Family History:  The patient's ***family history includes Alzheimer's disease in her sister; Aneurysm in her brother and mother; Bipolar disorder in her sister; COPD in her sister; Cancer in her mother; Depression in her maternal aunt, mother, and sister; Diabetes in her mother; Heart attack in an other family member; Heart failure in her brother, maternal grandfather, and sister; Lung cancer in her father; Stroke in her brother and mother.    ROS:  Please see the history of present illness.   Otherwise, review of systems are positive for {NONE DEFAULTED:18576}.   All other systems are reviewed and negative.    PHYSICAL EXAM: VS:  LMP  (LMP Unknown)  , BMI There is no height or weight on file to  calculate BMI. GENERAL:  Well appearing HEENT:  Pupils equal round and reactive, fundi not visualized, oral mucosa unremarkable NECK:  No jugular venous distention, waveform within normal limits, carotid upstroke brisk and symmetric, no bruits, no thyromegaly LYMPHATICS:  No cervical, inguinal adenopathy LUNGS:  Clear to auscultation bilaterally BACK:  No CVA tenderness CHEST:  Unremarkable HEART:  PMI not displaced or sustained,S1 and S2 within normal limits, no S3, no S4, no clicks, no rubs, *** murmurs ABD:  Flat, positive bowel sounds normal in frequency in pitch, no bruits, no rebound, no guarding, no midline pulsatile mass, no hepatomegaly, no splenomegaly EXT:  2 plus pulses throughout, no edema, no cyanosis no clubbing SKIN:  No rashes no nodules NEURO:  Cranial nerves II through XII grossly intact, motor grossly intact throughout PSYCH:  Cognitively intact, oriented to person place and time    EKG:        Recent Labs: 10/17/2022: ALT 14; BUN 23; Creatinine, Ser 0.84; Hemoglobin 16.0; Platelets 323; Potassium 3.9; Sodium 139    Lipid Panel    Component Value Date/Time   CHOL 186 12/16/2018 1157   TRIG 184.0 (H) 12/16/2018 1157   HDL 42.90 12/16/2018 1157   CHOLHDL 4 12/16/2018 1157   VLDL 36.8 12/16/2018 1157   LDLCALC 106 (H) 12/16/2018 1157      Wt Readings from Last 3 Encounters:  10/17/22 115 lb (52.2 kg)  07/07/22 117 lb 15.1 oz (53.5 kg)  12/14/21 118 lb (53.5 kg)      Other studies Reviewed: Additional studies/ records that were reviewed today include: ***. Review of the above records demonstrates:  Please see elsewhere in the note.  ***   ASSESSMENT AND PLAN:  Shortness of breath:***  Left ventricular hypertrophy:  ***    Current medicines are reviewed at length with the patient today.  The patient {ACTIONS; HAS/DOES NOT HAVE:19233} concerns regarding medicines.  The following changes have been made:  {PLAN; NO CHANGE:13088:s}  Labs/ tests  ordered today include: *** No orders of the defined types were placed in this encounter.    Disposition:   FU with ***    Signed, Rollene Rotunda, MD  01/26/2023 3:37 PM    [facility]

## 2023-01-27 ENCOUNTER — Ambulatory Visit: Payer: Medicare HMO | Attending: Cardiology | Admitting: Cardiology

## 2023-01-27 DIAGNOSIS — I517 Cardiomegaly: Secondary | ICD-10-CM

## 2023-01-27 DIAGNOSIS — I10 Essential (primary) hypertension: Secondary | ICD-10-CM

## 2023-01-27 DIAGNOSIS — R0602 Shortness of breath: Secondary | ICD-10-CM

## 2023-02-13 ENCOUNTER — Other Ambulatory Visit: Payer: Self-pay

## 2023-02-13 ENCOUNTER — Encounter (HOSPITAL_COMMUNITY): Payer: Self-pay | Admitting: Internal Medicine

## 2023-02-13 ENCOUNTER — Inpatient Hospital Stay (HOSPITAL_COMMUNITY)
Admission: EM | Admit: 2023-02-13 | Discharge: 2023-02-17 | DRG: 690 | Disposition: A | Discharge status: Skilled Nursing Facility | Payer: Medicare HMO | Attending: Internal Medicine | Admitting: Internal Medicine

## 2023-02-13 ENCOUNTER — Emergency Department (HOSPITAL_COMMUNITY): Payer: Medicare HMO

## 2023-02-13 DIAGNOSIS — Z66 Do not resuscitate: Secondary | ICD-10-CM | POA: Diagnosis present

## 2023-02-13 DIAGNOSIS — Z1611 Resistance to penicillins: Secondary | ICD-10-CM | POA: Diagnosis present

## 2023-02-13 DIAGNOSIS — R41841 Cognitive communication deficit: Secondary | ICD-10-CM | POA: Diagnosis not present

## 2023-02-13 DIAGNOSIS — Z885 Allergy status to narcotic agent status: Secondary | ICD-10-CM

## 2023-02-13 DIAGNOSIS — K589 Irritable bowel syndrome without diarrhea: Secondary | ICD-10-CM | POA: Diagnosis present

## 2023-02-13 DIAGNOSIS — B9620 Unspecified Escherichia coli [E. coli] as the cause of diseases classified elsewhere: Secondary | ICD-10-CM | POA: Diagnosis present

## 2023-02-13 DIAGNOSIS — Z825 Family history of asthma and other chronic lower respiratory diseases: Secondary | ICD-10-CM

## 2023-02-13 DIAGNOSIS — K219 Gastro-esophageal reflux disease without esophagitis: Secondary | ICD-10-CM | POA: Diagnosis present

## 2023-02-13 DIAGNOSIS — I4891 Unspecified atrial fibrillation: Secondary | ICD-10-CM | POA: Diagnosis present

## 2023-02-13 DIAGNOSIS — N183 Chronic kidney disease, stage 3 unspecified: Secondary | ICD-10-CM | POA: Diagnosis present

## 2023-02-13 DIAGNOSIS — I129 Hypertensive chronic kidney disease with stage 1 through stage 4 chronic kidney disease, or unspecified chronic kidney disease: Secondary | ICD-10-CM | POA: Diagnosis present

## 2023-02-13 DIAGNOSIS — Z86718 Personal history of other venous thrombosis and embolism: Secondary | ICD-10-CM | POA: Diagnosis not present

## 2023-02-13 DIAGNOSIS — Z79899 Other long term (current) drug therapy: Secondary | ICD-10-CM

## 2023-02-13 DIAGNOSIS — I10 Essential (primary) hypertension: Secondary | ICD-10-CM | POA: Diagnosis not present

## 2023-02-13 DIAGNOSIS — Z1152 Encounter for screening for COVID-19: Secondary | ICD-10-CM

## 2023-02-13 DIAGNOSIS — J4489 Other specified chronic obstructive pulmonary disease: Secondary | ICD-10-CM | POA: Diagnosis not present

## 2023-02-13 DIAGNOSIS — N390 Urinary tract infection, site not specified: Secondary | ICD-10-CM | POA: Diagnosis not present

## 2023-02-13 DIAGNOSIS — Z905 Acquired absence of kidney: Secondary | ICD-10-CM

## 2023-02-13 DIAGNOSIS — Z7901 Long term (current) use of anticoagulants: Secondary | ICD-10-CM | POA: Diagnosis not present

## 2023-02-13 DIAGNOSIS — R109 Unspecified abdominal pain: Secondary | ICD-10-CM | POA: Diagnosis not present

## 2023-02-13 DIAGNOSIS — N3001 Acute cystitis with hematuria: Secondary | ICD-10-CM

## 2023-02-13 DIAGNOSIS — R0789 Other chest pain: Secondary | ICD-10-CM | POA: Diagnosis not present

## 2023-02-13 DIAGNOSIS — Z8249 Family history of ischemic heart disease and other diseases of the circulatory system: Secondary | ICD-10-CM | POA: Diagnosis not present

## 2023-02-13 DIAGNOSIS — Z96641 Presence of right artificial hip joint: Secondary | ICD-10-CM | POA: Diagnosis not present

## 2023-02-13 DIAGNOSIS — E785 Hyperlipidemia, unspecified: Secondary | ICD-10-CM | POA: Diagnosis present

## 2023-02-13 DIAGNOSIS — R531 Weakness: Secondary | ICD-10-CM | POA: Diagnosis not present

## 2023-02-13 DIAGNOSIS — Z85528 Personal history of other malignant neoplasm of kidney: Secondary | ICD-10-CM | POA: Diagnosis not present

## 2023-02-13 DIAGNOSIS — I700 Atherosclerosis of aorta: Secondary | ICD-10-CM | POA: Diagnosis not present

## 2023-02-13 DIAGNOSIS — R1311 Dysphagia, oral phase: Secondary | ICD-10-CM | POA: Diagnosis not present

## 2023-02-13 DIAGNOSIS — Z8719 Personal history of other diseases of the digestive system: Secondary | ICD-10-CM

## 2023-02-13 DIAGNOSIS — E1122 Type 2 diabetes mellitus with diabetic chronic kidney disease: Secondary | ICD-10-CM | POA: Diagnosis not present

## 2023-02-13 DIAGNOSIS — M6281 Muscle weakness (generalized): Secondary | ICD-10-CM | POA: Diagnosis not present

## 2023-02-13 DIAGNOSIS — Z7984 Long term (current) use of oral hypoglycemic drugs: Secondary | ICD-10-CM | POA: Diagnosis not present

## 2023-02-13 DIAGNOSIS — G8929 Other chronic pain: Secondary | ICD-10-CM | POA: Diagnosis not present

## 2023-02-13 DIAGNOSIS — Z888 Allergy status to other drugs, medicaments and biological substances status: Secondary | ICD-10-CM

## 2023-02-13 DIAGNOSIS — R918 Other nonspecific abnormal finding of lung field: Secondary | ICD-10-CM | POA: Diagnosis not present

## 2023-02-13 DIAGNOSIS — R2689 Other abnormalities of gait and mobility: Secondary | ICD-10-CM | POA: Diagnosis not present

## 2023-02-13 DIAGNOSIS — I77810 Thoracic aortic ectasia: Secondary | ICD-10-CM | POA: Diagnosis not present

## 2023-02-13 DIAGNOSIS — J449 Chronic obstructive pulmonary disease, unspecified: Secondary | ICD-10-CM | POA: Diagnosis present

## 2023-02-13 DIAGNOSIS — Z833 Family history of diabetes mellitus: Secondary | ICD-10-CM | POA: Diagnosis not present

## 2023-02-13 DIAGNOSIS — R140 Abdominal distension (gaseous): Secondary | ICD-10-CM | POA: Diagnosis not present

## 2023-02-13 LAB — CBC WITH DIFFERENTIAL/PLATELET
Abs Immature Granulocytes: 0.02 10*3/uL (ref 0.00–0.07)
Basophils Absolute: 0.1 10*3/uL (ref 0.0–0.1)
Basophils Relative: 1 %
Eosinophils Absolute: 0.6 10*3/uL — ABNORMAL HIGH (ref 0.0–0.5)
Eosinophils Relative: 7 %
HCT: 44.3 % (ref 36.0–46.0)
Hemoglobin: 15 g/dL (ref 12.0–15.0)
Immature Granulocytes: 0 %
Lymphocytes Relative: 28 %
Lymphs Abs: 2.3 10*3/uL (ref 0.7–4.0)
MCH: 30.5 pg (ref 26.0–34.0)
MCHC: 33.9 g/dL (ref 30.0–36.0)
MCV: 90 fL (ref 80.0–100.0)
Monocytes Absolute: 0.6 10*3/uL (ref 0.1–1.0)
Monocytes Relative: 7 %
Neutro Abs: 4.7 10*3/uL (ref 1.7–7.7)
Neutrophils Relative %: 57 %
Platelets: 267 10*3/uL (ref 150–400)
RBC: 4.92 MIL/uL (ref 3.87–5.11)
RDW: 13.8 % (ref 11.5–15.5)
WBC: 8.2 10*3/uL (ref 4.0–10.5)
nRBC: 0 % (ref 0.0–0.2)

## 2023-02-13 LAB — COMPREHENSIVE METABOLIC PANEL
ALT: 14 U/L (ref 0–44)
AST: 17 U/L (ref 15–41)
Albumin: 3.8 g/dL (ref 3.5–5.0)
Alkaline Phosphatase: 61 U/L (ref 38–126)
Anion gap: 8 (ref 5–15)
BUN: 22 mg/dL (ref 8–23)
CO2: 24 mmol/L (ref 22–32)
Calcium: 8.6 mg/dL — ABNORMAL LOW (ref 8.9–10.3)
Chloride: 106 mmol/L (ref 98–111)
Creatinine, Ser: 0.84 mg/dL (ref 0.44–1.00)
GFR, Estimated: >60 mL/min (ref >60)
Glucose, Bld: 107 mg/dL — ABNORMAL HIGH (ref 70–99)
Potassium: 3.8 mmol/L (ref 3.5–5.1)
Sodium: 138 mmol/L (ref 135–145)
Total Bilirubin: 0.8 mg/dL (ref 0.3–1.2)
Total Protein: 7.1 g/dL (ref 6.5–8.1)

## 2023-02-13 LAB — TROPONIN I (HIGH SENSITIVITY)
Troponin I (High Sensitivity): 16 ng/L (ref <18)
Troponin I (High Sensitivity): 15 ng/L (ref <18)

## 2023-02-13 LAB — URINALYSIS, ROUTINE W REFLEX MICROSCOPIC
Bilirubin Urine: NEGATIVE
Glucose, UA: NEGATIVE mg/dL
Ketones, ur: NEGATIVE mg/dL
Nitrite: NEGATIVE
Protein, ur: NEGATIVE mg/dL
Specific Gravity, Urine: 1.003 — ABNORMAL LOW (ref 1.005–1.030)
WBC, UA: >50 WBC/hpf (ref 0–5)
pH: 7 (ref 5.0–8.0)

## 2023-02-13 LAB — TSH: TSH: 1.386 u[IU]/mL (ref 0.350–4.500)

## 2023-02-13 LAB — LIPASE, BLOOD: Lipase: 73 U/L — ABNORMAL HIGH (ref 11–51)

## 2023-02-13 LAB — SARS CORONAVIRUS 2 BY RT PCR: SARS Coronavirus 2 by RT PCR: NEGATIVE

## 2023-02-13 MED ORDER — HYDROCODONE-ACETAMINOPHEN 5-325 MG PO TABS
1.0000 | ORAL_TABLET | Freq: Four times a day (QID) | ORAL | Status: DC | PRN
  Administered 2023-02-14 – 2023-02-16 (×5): 1 via ORAL
  Filled 2023-02-13 (×5): qty 1

## 2023-02-13 MED ORDER — HYDROXYZINE HCL 10 MG PO TABS
10.0000 mg | ORAL_TABLET | Freq: Every day | ORAL | Status: DC | PRN
  Administered 2023-02-14 – 2023-02-15 (×2): 10 mg via ORAL
  Filled 2023-02-13 (×2): qty 1

## 2023-02-13 MED ORDER — SODIUM CHLORIDE 0.9 % IV SOLN
1.0000 g | INTRAVENOUS | Status: DC
  Administered 2023-02-14 – 2023-02-15 (×2): 1 g via INTRAVENOUS
  Filled 2023-02-13 (×3): qty 10

## 2023-02-13 MED ORDER — ALBUTEROL SULFATE (2.5 MG/3ML) 0.083% IN NEBU: 2.5000 mg | INHALATION_SOLUTION | RESPIRATORY_TRACT | Status: DC | PRN

## 2023-02-13 MED ORDER — LABETALOL HCL 5 MG/ML IV SOLN
INTRAVENOUS | Status: AC
  Administered 2023-02-13: 10 mg via INTRAVENOUS
  Filled 2023-02-13: qty 4

## 2023-02-13 MED ORDER — SODIUM CHLORIDE 0.9 % IV SOLN
1.0000 g | Freq: Once | INTRAVENOUS | Status: AC
  Administered 2023-02-13: 1 g via INTRAVENOUS
  Filled 2023-02-13: qty 10

## 2023-02-13 MED ORDER — SENNOSIDES-DOCUSATE SODIUM 8.6-50 MG PO TABS
1.0000 | ORAL_TABLET | Freq: Every evening | ORAL | Status: DC | PRN
  Administered 2023-02-14: 1 via ORAL
  Filled 2023-02-13: qty 1

## 2023-02-13 MED ORDER — ACETAMINOPHEN 325 MG PO TABS
650.0000 mg | ORAL_TABLET | Freq: Four times a day (QID) | ORAL | Status: DC | PRN
  Administered 2023-02-14: 650 mg via ORAL
  Filled 2023-02-13: qty 2

## 2023-02-13 MED ORDER — ONDANSETRON HCL 4 MG/2ML IJ SOLN: 4.0000 mg | Freq: Four times a day (QID) | INTRAMUSCULAR | Status: DC | PRN

## 2023-02-13 MED ORDER — LABETALOL HCL 5 MG/ML IV SOLN
10.0000 mg | INTRAVENOUS | Status: DC | PRN
  Administered 2023-02-13: 10 mg via INTRAVENOUS

## 2023-02-13 MED ORDER — SODIUM CHLORIDE 0.9% FLUSH
3.0000 mL | Freq: Two times a day (BID) | INTRAVENOUS | Status: DC
  Administered 2023-02-13 – 2023-02-17 (×7): 3 mL via INTRAVENOUS

## 2023-02-13 MED ORDER — ONDANSETRON HCL 4 MG PO TABS: 4.0000 mg | ORAL_TABLET | Freq: Four times a day (QID) | ORAL | Status: DC | PRN

## 2023-02-13 MED ORDER — ENOXAPARIN SODIUM 40 MG/0.4ML IJ SOSY
40.0000 mg | PREFILLED_SYRINGE | INTRAMUSCULAR | Status: DC
  Administered 2023-02-13 – 2023-02-16 (×4): 40 mg via SUBCUTANEOUS
  Filled 2023-02-13 (×4): qty 0.4

## 2023-02-13 MED ORDER — ALBUTEROL SULFATE HFA 108 (90 BASE) MCG/ACT IN AERS: 1.0000 | INHALATION_SPRAY | RESPIRATORY_TRACT | Status: DC | PRN

## 2023-02-13 MED ORDER — ACETAMINOPHEN 650 MG RE SUPP: 650.0000 mg | Freq: Four times a day (QID) | RECTAL | Status: DC | PRN

## 2023-02-13 MED ORDER — AMLODIPINE BESYLATE 10 MG PO TABS
10.0000 mg | ORAL_TABLET | Freq: Every day | ORAL | Status: DC
  Administered 2023-02-13 – 2023-02-17 (×5): 10 mg via ORAL
  Filled 2023-02-13 (×5): qty 1

## 2023-02-13 NOTE — Hospital Course (Signed)
Alexa [name] President is a 89 y.o. female with medical history significant for COPD, HTN, renal carcinoma s/p left nephrectomy, s/p right THA 11/2021 who is admitted with UTI.

## 2023-02-13 NOTE — H&P (Signed)
History and Physical    Alexa [name] DGU:[id number] DOB: 1929/12/24 DOA: 02/13/2023  PCP: Charlane Ferretti, DO  Patient coming from: Home  I have personally briefly reviewed patient's old medical records in [facility]  Chief Complaint: Dysuria, generalized weakness  HPI: Alexa [name] is a 89 y.o. female with medical history significant for COPD, HTN, renal carcinoma s/p left nephrectomy, s/p right THA 11/2021 who presented to the ED for evaluation of dysuria and generalized weakness.  Patient states that she has been feeling poorly for about 1 week with low energy.  She has been having burning with urination over the last 24 hours.  Initially she had decreased urine output which is now more frequent.  She has been having suprapubic discomfort.  She lives by herself.  She has been ambulating with a walker since her right hip replacement last year.  She says over the last couple days she has had increased difficulty getting up and ambulating in the house even when using her walker.  She has been feeling somewhat "foggy headed."  ED Course  Labs/Imaging on admission: I have personally reviewed following labs and imaging studies.  Initial vitals showed BP 152/86, pulse 86, RR 18, temp 98.4 F, SpO2 97% on room air.  Labs show WBC 8.2, hemoglobin 15.0, platelets 267,000, sodium 138, potassium 3.8, bicarb 24, BUN 22, creatinine 0.84, serum glucose 107, LFTs within normal limits, lipase 73.  Urinalysis shows negative nitrites, large leukocytes, 21-50 RBCs, >50 WBCs, many bacteria on microscopy.  Urine culture in process.  SARS-CoV-2 PCR negative.  Portable chest x-ray shows emphysematous changes without evidence of active pulmonary disease.  Patient was given IV ceftriaxone.  The hospitalist service was consulted to admit for further evaluation and management.  Review of Systems: All systems reviewed and are negative except as documented in history of present illness above.   Past  Medical History:  Diagnosis Date   Angio-edema    Asthma    Chronic obstructive pulmonary disease (HCC)    Diverticulosis    Diverticulosis of colon (without mention of hemorrhage)    Eczema    Esophageal reflux    History of colonic polyps 06/05/05   hyperplastic   Internal hemorrhoids    Irritable bowel syndrome    Ischemic colitis (HCC)    Malignant neoplasm of kidney and other and unspecified urinary organs    renal carcinoma, left nephrectomy in 2008-per patient.   Other and unspecified hyperlipidemia    Personal history of venous thrombosis and embolism    PTE after nephrectomy   Spinal stenosis    Type II or unspecified type diabetes mellitus without mention of complication, not stated as uncontrolled 02/05/2012   pt states it was in 2007   Unspecified essential hypertension    Urticaria     Past Surgical History:  Procedure Laterality Date   ADENOIDECTOMY     BREAST BIOPSY     COLONOSCOPY W/ POLYPECTOMY  2000   COLONOSCOPY W/ POLYPECTOMY  07/2004   Hyperplastic polyps   DILATION AND CURETTAGE OF UTERUS  03/2005   Uterine Polyps   NEPHRECTOMY  2007   right,Dr Dahlstedt   SEPTOPLASTY     TONSILLECTOMY     TOTAL HIP ARTHROPLASTY Right 12/16/2021   Procedure: TOTAL HIP ARTHROPLASTY ANTERIOR APPROACH;  Surgeon: Samson Frederic, MD;  Location: [facility];  Service: Orthopedics;  Laterality: Right;    Social History:  reports that she does not have a smoking history on file. She has  never used smokeless tobacco. She reports that she does not drink alcohol and does not use drugs.  Allergies  Allergen Reactions   Lisinopril     Cough D/Ced by Dr Sherene Sires   Codeine     nausea   Verapamil     Pain in feet    Family History  Problem Relation Age of Onset   Stroke Mother        onset in 73s   Diabetes Mother    Cancer Mother        bladder; nephrectomy for calculi/also uterine , TAH & BSO   Aneurysm Mother        thoracic   Depression Mother    Stroke Brother     Heart failure Brother    Heart attack Other        maternal family history   Heart failure Maternal Grandfather    Lung cancer Father        smoked   Heart failure Sister    Depression Sister    Alzheimer's disease Sister    COPD Sister    Aneurysm Brother        cns   Depression Maternal Aunt        X2   Bipolar disorder Sister    Alcoholism Neg Hx      Prior to Admission medications   Medication Sig Start Date End Date Taking? Authorizing Provider  apixaban (ELIQUIS) 2.5 MG TABS tablet Take 1 tablet (2.5 mg total) by mouth 2 (two) times daily. 12/17/21 01/16/22  Clois Dupes, PA-C  Benzalkonium Chloride (DIABETIC BASICS HEALTHY FOOT EX) Apply 1 application topically daily as needed (foot pain). OTC diabetic foot cream    [provider]  CRANBERRY PO Take 2 capsules by mouth 2 (two) times daily. 42mg     [provider]  ESTRACE VAGINAL 0.1 MG/GM vaginal cream Apply locally every other night at bedtime 01/16/16   [provider]  hydrOXYzine (ATARAX) 10 MG tablet Take 10 mg by mouth daily as needed for itching. 11/13/21   [provider]  Multiple Vitamins-Minerals (EYE VITAMINS PO) Take by mouth. AREDS2    [provider]  Vitamin D, Ergocalciferol, (DRISDOL) 1.25 MG (50000 UNIT) CAPS capsule Take 50,000 Units by mouth once a week. 11/29/21   [provider]  amLODipine (NORVASC) 5 MG tablet Take 1 tablet (5 mg total) by mouth daily. 07/27/20 10/09/22  Copland, Gwenlyn Found, MD  ferrous sulfate 325 (65 FE) MG tablet Take 1 tablet (325 mg total) by mouth daily with breakfast. 12/20/21 10/09/22  Erick Blinks, MD  fluticasone (FLONASE) 50 MCG/ACT nasal spray INSTILL 2 SPRAYS INTO EACH NOSTRIL ONCE DAILY Patient taking differently: Place 2 sprays into both nostrils 2 (two) times daily. INSTILL 2 SPRAYS INTO EACH NOSTRIL ONCE DAILY 04/28/20 10/09/22  Copland, Gwenlyn Found, MD  sitaGLIPtin (JANUVIA) 50 MG tablet Take 1 tablet (50 mg total) by mouth  daily. 10/19/20 10/09/22  CoplandGwenlyn Found, MD    Physical Exam: Vitals:   02/13/23 1915 02/13/23 1930 02/13/23 2000 02/13/23 2015  BP: (!) 140/86 (!) 155/99 (!) 174/89 (!) 178/104  Pulse:  89 95 87  Resp: (!) 24 19 18 14   Temp:      SpO2:  96% 95% 96%   Constitutional: Elderly woman resting in bed, NAD, calm, comfortable Eyes: EOMI, lids and conjunctivae normal ENMT: Mucous membranes are moist. Posterior pharynx clear of any exudate or lesions.Normal dentition.  Neck: normal, supple, no masses.  Respiratory: clear to auscultation bilaterally, no wheezing, no crackles. Normal respiratory effort. No accessory muscle use.  Cardiovascular: Irregular, no murmurs / rubs / gallops. No extremity edema. 2+ pedal pulses. Abdomen: Soft, no tenderness, no masses palpated.  Musculoskeletal: no clubbing / cyanosis. No joint deformity upper and lower extremities. Good ROM, no contractures. Normal muscle tone.  Skin: no rashes, lesions, ulcers. No induration Neurologic: Sensation intact. Strength 5/5 in all 4.  Psychiatric: Alert and oriented x 3. Normal mood.   EKG: Personally reviewed.  Sinus rhythm, rate 79, frequent PACs.  Assessment/Plan Principal Problem:   Urinary tract infection Active Problems:   Generalized weakness   Essential hypertension   COPD (chronic obstructive pulmonary disease) (HCC)   Alexa [name] is a 89 y.o. female with medical history significant for COPD, HTN, renal carcinoma s/p left nephrectomy, s/p right THA 11/2021 who is admitted with UTI.  Assessment and Plan: Urinary tract infection: Presenting with urinary symptoms, generalized weakness.  Some reported confusion at home but mentating well at time of admission. -Continue IV ceftriaxone -Follow urine culture  Generalized weakness: Patient reports decreased mobility despite use of walker.  Denies fall or injury.  She is asking about rehab.  Continue treatment of UTI and request PT/OT eval.  COPD: Stable  without wheezing.  Hypertension: BP has been elevated.  Increase home amlodipine from 5 to 10 mg daily.  IV labetalol as needed.  Frequent PACs: There was question of atrial fibrillation on arrival.  EKG and telemetry appear to show discrete P waves with frequent PACs.  No prior documented history of A-fib.  Will monitor on telemetry overnight.  Chronic pain: Continue home Norco as needed.   DVT prophylaxis: enoxaparin (LOVENOX) injection 40 mg Start: 02/13/23 2100 Code Status: DNR, confirmed with patient on admission Family Communication: Discussed with patient, she has discussed with family Disposition Plan: From home and likely discharge to home pending clinical progress Consults called: None Severity of Illness: The appropriate patient status for this patient is OBSERVATION. Observation status is judged to be reasonable and necessary in order to provide the required intensity of service to ensure the patient's safety. The patient's presenting symptoms, physical exam findings, and initial radiographic and laboratory data in the context of their medical condition is felt to place them at decreased risk for further clinical deterioration. Furthermore, it is anticipated that the patient will be medically stable for discharge from the hospital within 2 midnights of admission.   Darreld Mclean MD [facility]  If 7PM-7AM, please contact night-coverage [email or web address]  02/13/2023, 8:55 PM

## 2023-02-13 NOTE — ED Notes (Signed)
ED TO INPATIENT HANDOFF REPORT  ED Nurse Name and Phone #:   Rayvon Char Name/Age/Gender Alexa [name] 89 y.o. female Room/Bed: WA03/WA03  Code Status   Code Status: Prior  Home/SNF/Other Home Patient oriented to: self, place, time, and situation Is this baseline? Yes   Triage Complete: Triage complete  Chief Complaint Urinary tract infection [N39.0]  Triage Note Pt BIBA with c/o generalized weakness, urine retention, loss of appetite, and abdominal distention. Has been going on for about a month but has progressively gotten worse. Producing a small amount of urine but denies burning or discomfort. Has one kidney d/t cancer. Initial BP with EMS was 186/94.   BP 163/86 HR 80's  CBG 102 96% RA RR 18   Allergies Allergies  Allergen Reactions   Lisinopril     Cough D/Ced by Dr Sherene Sires   Codeine     nausea   Verapamil     Pain in feet    Level of Care/Admitting Diagnosis ED Disposition     ED Disposition  Admit   Condition  --   Comment  Hospital Area: [facility] [[id number]]  Level of Care: Telemetry [5]  Admit to tele based on following criteria: Complex arrhythmia (Bradycardia/Tachycardia)  May place patient in observation at [facility] or [facility] if equivalent level of care is available:: No  Covid Evaluation: Confirmed COVID Negative  Diagnosis: Urinary tract infection [[id number]]  Admitting Physician: Charlsie Quest [[id number]]  Attending Physician: Charlsie Quest [[id number]]          B Medical/Surgery History Past Medical History:  Diagnosis Date   Angio-edema    Asthma    Chronic obstructive pulmonary disease (HCC)    Diverticulosis    Diverticulosis of colon (without mention of hemorrhage)    Eczema    Esophageal reflux    History of colonic polyps 06/05/05   hyperplastic   Internal hemorrhoids    Irritable bowel syndrome    Ischemic colitis (HCC)    Malignant neoplasm of kidney and other and unspecified urinary organs     renal carcinoma, left nephrectomy in 2008-per patient.   Other and unspecified hyperlipidemia    Personal history of venous thrombosis and embolism    PTE after nephrectomy   Spinal stenosis    Type II or unspecified type diabetes mellitus without mention of complication, not stated as uncontrolled 02/05/2012   pt states it was in 2007   Unspecified essential hypertension    Urticaria    Past Surgical History:  Procedure Laterality Date   ADENOIDECTOMY     BREAST BIOPSY     COLONOSCOPY W/ POLYPECTOMY  2000   COLONOSCOPY W/ POLYPECTOMY  07/2004   Hyperplastic polyps   DILATION AND CURETTAGE OF UTERUS  03/2005   Uterine Polyps   NEPHRECTOMY  2007   right,Dr Dahlstedt   SEPTOPLASTY     TONSILLECTOMY     TOTAL HIP ARTHROPLASTY Right 12/16/2021   Procedure: TOTAL HIP ARTHROPLASTY ANTERIOR APPROACH;  Surgeon: Samson Frederic, MD;  Location: [facility];  Service: Orthopedics;  Laterality: Right;     A IV Location/Drains/Wounds Patient Lines/Drains/Airways Status     Active Line/Drains/Airways     Name Placement date Placement time Site Days   Peripheral IV 02/13/23 20 G 1" Left Antecubital 02/13/23  1709  Antecubital  less than 1            Intake/Output Last 24 hours No intake or output data in the 24 hours  ending 02/13/23 2047  Labs/Imaging Results for orders placed or performed during the hospital encounter of 02/13/23 (from the past 48 hour(s))  Urinalysis, Routine w reflex microscopic -Urine, Clean Catch     Status: Abnormal   Collection Time: 02/13/23  5:44 PM  Result Value Ref Range   Color, Urine STRAW (A) YELLOW   APPearance CLOUDY (A) CLEAR   Specific Gravity, Urine 1.003 (L) 1.005 - 1.030   pH 7.0 5.0 - 8.0   Glucose, UA NEGATIVE NEGATIVE mg/dL   Hgb urine dipstick SMALL (A) NEGATIVE   Bilirubin Urine NEGATIVE NEGATIVE   Ketones, ur NEGATIVE NEGATIVE mg/dL   Protein, ur NEGATIVE NEGATIVE mg/dL   Nitrite NEGATIVE NEGATIVE   Leukocytes,Ua LARGE (A) NEGATIVE    RBC / HPF 21-50 0 - 5 RBC/hpf   WBC, UA >50 0 - 5 WBC/hpf   Bacteria, UA MANY (A) NONE SEEN   Squamous Epithelial / HPF 0-5 0 - 5 /HPF   WBC Clumps PRESENT     Comment: Performed at [facility], [address] [place], [place] [address]  SARS Coronavirus 2 by RT PCR (hospital order, performed in [facility] hospital lab) *cepheid single result test* Anterior Nasal Swab     Status: None   Collection Time: 02/13/23  5:44 PM   Specimen: Anterior Nasal Swab  Result Value Ref Range   SARS Coronavirus 2 by RT PCR NEGATIVE NEGATIVE    Comment: (NOTE) SARS-CoV-2 target nucleic acids are NOT DETECTED.  The SARS-CoV-2 RNA is generally detectable in upper and lower respiratory specimens during the acute phase of infection. The lowest concentration of SARS-CoV-2 viral copies this assay can detect is 250 copies / mL. A negative result does not preclude SARS-CoV-2 infection and should not be used as the sole basis for treatment or other patient management decisions.  A negative result may occur with improper specimen collection / handling, submission of specimen other than nasopharyngeal swab, presence of viral mutation(s) within the areas targeted by this assay, and inadequate number of viral copies (<250 copies / mL). A negative result must be combined with clinical observations, patient history, and epidemiological information.  Fact Sheet for Patients:   RoadLapTop.co.za  Fact Sheet for Healthcare Providers: http://kim-miller.com/  This test is not yet approved or  cleared by the [place] FDA and has been authorized for detection and/or diagnosis of SARS-CoV-2 by FDA under an Emergency Use Authorization (EUA).  This EUA will remain in effect (meaning this test can be used) for the duration of the COVID-19 declaration under Section 564(b)(1) of the Act, 21 U.S.C. section 360bbb-3(b)(1), unless the authorization is  terminated or revoked sooner.  Performed at [facility], [address] [place], [place] [address]   Comprehensive metabolic panel     Status: Abnormal   Collection Time: 02/13/23  6:34 PM  Result Value Ref Range   Sodium 138 135 - 145 mmol/L   Potassium 3.8 3.5 - 5.1 mmol/L   Chloride 106 98 - 111 mmol/L   CO2 24 22 - 32 mmol/L   Glucose, Bld 107 (H) 70 - 99 mg/dL    Comment: Glucose reference range applies only to samples taken after fasting for at least 8 hours.   BUN 22 8 - 23 mg/dL   Creatinine, Ser 4.74 0.44 - 1.00 mg/dL   Calcium 8.6 (L) 8.9 - 10.3 mg/dL   Total Protein 7.1 6.5 - 8.1 g/dL   Albumin 3.8 3.5 - 5.0 g/dL  AST 17 15 - 41 U/L   ALT 14 0 - 44 U/L   Alkaline Phosphatase 61 38 - 126 U/L   Total Bilirubin 0.8 0.3 - 1.2 mg/dL   GFR, Estimated >16 >10 mL/min    Comment: (NOTE) Calculated using the CKD-EPI Creatinine Equation (2021)    Anion gap 8 5 - 15    Comment: Performed at [facility], [address] [place], [place] [address]  CBC with Differential     Status: Abnormal   Collection Time: 02/13/23  6:34 PM  Result Value Ref Range   WBC 8.2 4.0 - 10.5 K/uL   RBC 4.92 3.87 - 5.11 MIL/uL   Hemoglobin 15.0 12.0 - 15.0 g/dL   HCT 40.9 81.1 - 91.4 %   MCV 90.0 80.0 - 100.0 fL   MCH 30.5 26.0 - 34.0 pg   MCHC 33.9 30.0 - 36.0 g/dL   RDW 78.2 95.6 - 21.3 %   Platelets 267 150 - 400 K/uL   nRBC 0.0 0.0 - 0.2 %   Neutrophils Relative % 57 %   Neutro Abs 4.7 1.7 - 7.7 K/uL   Lymphocytes Relative 28 %   Lymphs Abs 2.3 0.7 - 4.0 K/uL   Monocytes Relative 7 %   Monocytes Absolute 0.6 0.1 - 1.0 K/uL   Eosinophils Relative 7 %   Eosinophils Absolute 0.6 (H) 0.0 - 0.5 K/uL   Basophils Relative 1 %   Basophils Absolute 0.1 0.0 - 0.1 K/uL   Immature Granulocytes 0 %   Abs Immature Granulocytes 0.02 0.00 - 0.07 K/uL    Comment: Performed at [facility], [address], [place], [place] [address]   Lipase, blood     Status: Abnormal   Collection Time: 02/13/23  6:34 PM  Result Value Ref Range   Lipase 73 (H) 11 - 51 U/L    Comment: Performed at [facility], [address], [place], [place] [address]  Troponin I (High Sensitivity)     Status: None   Collection Time: 02/13/23  6:34 PM  Result Value Ref Range   Troponin I (High Sensitivity) 16 <18 ng/L    Comment: (NOTE) Elevated high sensitivity troponin I (hsTnI) values and significant  changes across serial measurements may suggest ACS but many other  chronic and acute conditions are known to elevate hsTnI results.  Refer to the "Links" section for chest pain algorithms and additional  guidance. Performed at [facility], [address], [place], [place] [address]   TSH     Status: None   Collection Time: 02/13/23  6:34 PM  Result Value Ref Range   TSH 1.386 0.350 - 4.500 uIU/mL    Comment: Performed by a 3rd Generation assay with a functional sensitivity of <=0.01 uIU/mL. Performed at [facility], [address], [place], [place] [address]    DG Chest Portable 1 View  Result Date: 02/13/2023 CLINICAL DATA:  Generalized weakness. Chest tightness and sputum since 1 week ago. Hypertension. EXAM: PORTABLE CHEST 1 VIEW COMPARISON:  12/14/2021 FINDINGS: Heart size and pulmonary vascularity are normal. Calcified and ectatic thoracic aorta similar to prior study. Emphysematous changes are suggested in the lungs. No airspace disease or consolidation. No pleural effusions. No pneumothorax. Degenerative changes in the spine and shoulders. IMPRESSION: 1. Emphysematous changes in the lungs. No evidence of active pulmonary disease. 2. Calcified and ectatic thoracic aorta. Electronically Signed   By: Marisa Cyphers.D.  On: 02/13/2023 18:14    Pending Labs Unresulted Labs (From admission, onward)     Start     Ordered   02/13/23 1953  Urine Culture  Once,   URGENT        Question:  Indication  Answer:  Dysuria   02/13/23 1952            Vitals/Pain Today's Vitals   02/13/23 1915 02/13/23 1930 02/13/23 2000 02/13/23 2015  BP: (!) 140/86 (!) 155/99 (!) 174/89 (!) 178/104  Pulse:  89 95 87  Resp: (!) 24 19 18 14   Temp:      SpO2:  96% 95% 96%    Isolation Precautions No active isolations  Medications Medications  cefTRIAXone (ROCEPHIN) 1 g in sodium chloride 0.9 % 100 mL IVPB (1 g Intravenous New Bag/Given 02/13/23 2043)    Mobility walks with device     Focused Assessments     R Recommendations: See Admitting Provider Note  Report given to:   Additional Notes:

## 2023-02-13 NOTE — Plan of Care (Signed)

## 2023-02-13 NOTE — ED Triage Notes (Signed)
Pt BIBA with c/o generalized weakness, urine retention, loss of appetite, and abdominal distention. Has been going on for about a month but has progressively gotten worse. Producing a small amount of urine but denies burning or discomfort. Has one kidney d/t cancer. Initial BP with EMS was 186/94.   BP 163/86 HR 80's  CBG 102 96% RA RR 18

## 2023-02-13 NOTE — ED Provider Notes (Signed)
[place] EMERGENCY DEPARTMENT AT [facility] Provider Note   CSN: [id number] Arrival date & time: 02/13/23  1651     History  Chief Complaint  Patient presents with   Generalized Weakness    Alexa [name] is a 89 y.o. female with a history of A-fib reports that she is not on Eliquis presented to ED with generalized weakness.  Patient reports she has been feeling poorly for about a week.  She describes low energy.  Says typically can get up and walk with her walker in her home but has been having difficulty doing it the past 2 days.  She reports she has had burning with urination for the past 24 hours.  She lives by herself.  She typically is able to care for herself.  Hx of stage 3 CKD and nephrectomy, UTI's,   HPI     Home Medications Prior to Admission medications   Medication Sig Start Date End Date Taking? Authorizing Provider  Benzalkonium Chloride (DIABETIC BASICS HEALTHY FOOT EX) Apply 1 application topically daily as needed (foot pain). OTC diabetic foot cream   Yes [provider]  CRANBERRY PO Take 2 capsules by mouth 2 (two) times daily. 42mg    Yes [provider]  ESTRACE VAGINAL 0.1 MG/GM vaginal cream Apply locally every other night at bedtime 01/16/16  Yes [provider]  HYDROcodone-acetaminophen (NORCO/VICODIN) 5-325 MG tablet Take 1 tablet by mouth every 6 (six) hours as needed for moderate pain.   Yes [provider]  hydrOXYzine (ATARAX) 10 MG tablet Take 10 mg by mouth daily as needed for itching. 11/13/21  Yes [provider]  Multiple Vitamins-Minerals (EYE VITAMINS PO) Take by mouth. AREDS2   Yes [provider]  sitaGLIPtin (JANUVIA) 50 MG tablet Take 50 mg by mouth daily.   Yes [provider]  Vitamin D, Ergocalciferol, (DRISDOL) 1.25 MG (50000 UNIT) CAPS capsule Take 50,000 Units by mouth once a week. 11/29/21  Yes [provider]  amLODipine (NORVASC) 5 MG tablet Take 5 mg  by mouth daily.    [provider]  ferrous sulfate 325 (65 FE) MG tablet Take 1 tablet (325 mg total) by mouth daily with breakfast. 12/20/21 10/09/22  Erick Blinks, MD  fluticasone (FLONASE) 50 MCG/ACT nasal spray INSTILL 2 SPRAYS INTO EACH NOSTRIL ONCE DAILY Patient taking differently: Place 2 sprays into both nostrils 2 (two) times daily. INSTILL 2 SPRAYS INTO EACH NOSTRIL ONCE DAILY 04/28/20 10/09/22  Copland, Gwenlyn Found, MD      Allergies    Lisinopril, Codeine, and Verapamil    Review of Systems   Review of Systems  Physical Exam Updated Vital Signs BP (!) 156/80 (BP Location: Left Arm)   Pulse 91   Temp 97.8 F (36.6 C) (Oral)   Resp 18   Ht 5\' 1"  (1.549 m)   Wt 52.2 kg   LMP  (LMP Unknown)   SpO2 98%   BMI 21.74 kg/m  Physical Exam Constitutional:      General: She is not in acute distress. HENT:     Head: Normocephalic and atraumatic.  Eyes:     Conjunctiva/sclera: Conjunctivae normal.     Pupils: Pupils are equal, round, and reactive to light.  Cardiovascular:     Rate and Rhythm: Normal rate. Rhythm irregular.  Pulmonary:     Effort: Pulmonary effort is normal. No respiratory distress.  Abdominal:     General: There is no distension.     Tenderness: There  is no abdominal tenderness.  Skin:    General: Skin is warm and dry.  Neurological:     General: No focal deficit present.     Mental Status: She is alert and oriented to person, place, and time. Mental status is at baseline.  Psychiatric:        Mood and Affect: Mood normal.        Behavior: Behavior normal.     ED Results / Procedures / Treatments   Labs (all labs ordered are listed, but only abnormal results are displayed) Labs Reviewed  COMPREHENSIVE METABOLIC PANEL - Abnormal; Notable for the following components:      Result Value   Glucose, Bld 107 (*)    Calcium 8.6 (*)    All other components within normal limits  CBC WITH DIFFERENTIAL/PLATELET - Abnormal; Notable for the  following components:   Eosinophils Absolute 0.6 (*)    All other components within normal limits  LIPASE, BLOOD - Abnormal; Notable for the following components:   Lipase 73 (*)    All other components within normal limits  URINALYSIS, ROUTINE W REFLEX MICROSCOPIC - Abnormal; Notable for the following components:   Color, Urine STRAW (*)    APPearance CLOUDY (*)    Specific Gravity, Urine 1.003 (*)    Hgb urine dipstick SMALL (*)    Leukocytes,Ua LARGE (*)    Bacteria, UA MANY (*)    All other components within normal limits  BASIC METABOLIC PANEL - Abnormal; Notable for the following components:   Glucose, Bld 126 (*)    Calcium 8.4 (*)    All other components within normal limits  SARS CORONAVIRUS 2 BY RT PCR  URINE CULTURE  TSH  CBC  MAGNESIUM  COMPREHENSIVE METABOLIC PANEL  CBC  MAGNESIUM  TROPONIN I (HIGH SENSITIVITY)  TROPONIN I (HIGH SENSITIVITY)    EKG EKG Interpretation Date/Time:  Thursday February 13 2023 17:04:34 EDT Ventricular Rate:  79 PR Interval:  203 QRS Duration:  84 QT Interval:  382 QTC Calculation: 438 R Axis:   -48  Text Interpretation: Sinus rhythm Atrial premature complexes Left anterior fascicular block Abnormal R-wave progression, early transition Consider left ventricular hypertrophy Confirmed by Alvester Chou [phone]) on 02/13/2023 6:13:11 PM  Radiology DG Chest Portable 1 View  Result Date: 02/13/2023 CLINICAL DATA:  Generalized weakness. Chest tightness and sputum since 1 week ago. Hypertension. EXAM: PORTABLE CHEST 1 VIEW COMPARISON:  12/14/2021 FINDINGS: Heart size and pulmonary vascularity are normal. Calcified and ectatic thoracic aorta similar to prior study. Emphysematous changes are suggested in the lungs. No airspace disease or consolidation. No pleural effusions. No pneumothorax. Degenerative changes in the spine and shoulders. IMPRESSION: 1. Emphysematous changes in the lungs. No evidence of active pulmonary disease. 2. Calcified and  ectatic thoracic aorta. Electronically Signed   By: Burman Nieves M.D.   On: 02/13/2023 18:14    Procedures Procedures    Medications Ordered in ED Medications  labetalol (NORMODYNE) injection 10 mg (10 mg Intravenous Given 02/13/23 2100)  enoxaparin (LOVENOX) injection 40 mg (40 mg Subcutaneous Given 02/13/23 2249)  sodium chloride flush (NS) 0.9 % injection 3 mL (3 mLs Intravenous Given 02/14/23 0949)  acetaminophen (TYLENOL) tablet 650 mg (650 mg Oral Given 02/14/23 0815)    Or  acetaminophen (TYLENOL) suppository 650 mg ( Rectal See Alternative 02/14/23 0815)  ondansetron (ZOFRAN) tablet 4 mg (has no administration in time range)    Or  ondansetron (ZOFRAN) injection 4 mg (has no administration in time  range)  senna-docusate (Senokot-S) tablet 1 tablet (has no administration in time range)  cefTRIAXone (ROCEPHIN) 1 g in sodium chloride 0.9 % 100 mL IVPB (has no administration in time range)  amLODipine (NORVASC) tablet 10 mg (10 mg Oral Given 02/14/23 0948)  HYDROcodone-acetaminophen (NORCO/VICODIN) 5-325 MG per tablet 1 tablet (1 tablet Oral Given 02/14/23 1220)  hydrOXYzine (ATARAX) tablet 10 mg (has no administration in time range)  albuterol (PROVENTIL) (2.5 MG/3ML) 0.083% nebulizer solution 2.5 mg (has no administration in time range)  bisacodyl (DULCOLAX) suppository 10 mg (10 mg Rectal Given 02/14/23 0825)  cefTRIAXone (ROCEPHIN) 1 g in sodium chloride 0.9 % 100 mL IVPB (0 g Intravenous Stopped 02/13/23 2143)  bisacodyl (DULCOLAX) 10 MG suppository (  Duplicate 02/14/23 0830)    ED Course/ Medical Decision Making/ A&P Clinical Course as of 02/14/23 1702  Thu Feb 13, 2023  2012 Admitted to hospitalist [MT]    Clinical Course User Index [MT] Terald Sleeper, MD                             Medical Decision Making Amount and/or Complexity of Data Reviewed Labs: ordered. Radiology: ordered.  Risk Decision regarding hospitalization.   This patient presents to the ED  with concern for weakness, fatigue. This involves an extensive number of treatment options, and is a complaint that carries with it a high risk of complications and morbidity.  The differential diagnosis includes anemia vs dehydration vs viral illness vs infection vs other  Co-morbidities that complicate the patient evaluation: hx of recurring UTI's   I ordered and personally interpreted labs.  The pertinent results include: UA with large leukocytes and many bacteria, many white blood cells, negative nitrites.  COVID is negative.  White blood cell count and hemoglobin within normal limits.  Troponins unremarkable.  I ordered imaging studies including dg chest I independently visualized and interpreted imaging which showed no emergent findings I agree with the radiologist interpretation  The patient was maintained on a cardiac monitor.  I personally viewed and interpreted the cardiac monitored which showed an underlying rhythm of: rate controlled A Fib  Per my interpretation the patient's ECG shows rate controlled A Fib  I ordered medication including IV rocephin for UTI  I have reviewed the patients home medicines and have made adjustments as needed  Test Considered: doubt meningitis, CVA, no indication for emergent neuroimaging or LP   After the interventions noted above, I reevaluated the patient and found that they have: stayed the same   Dispostion:  After consideration of the diagnostic results and the patients response to treatment, I feel that the patent would benefit from medical admission for suspected UTI with generalized weakness, mild confusion.         Final Clinical Impression(s) / ED Diagnoses Final diagnoses:  Urinary tract infection without hematuria, site unspecified  Weakness    Rx / DC Orders ED Discharge Orders     None         Terald Sleeper, MD 02/14/23 1702

## 2023-02-14 ENCOUNTER — Inpatient Hospital Stay (HOSPITAL_COMMUNITY): Payer: Medicare HMO

## 2023-02-14 DIAGNOSIS — Z833 Family history of diabetes mellitus: Secondary | ICD-10-CM | POA: Diagnosis not present

## 2023-02-14 DIAGNOSIS — N390 Urinary tract infection, site not specified: Secondary | ICD-10-CM | POA: Diagnosis present

## 2023-02-14 DIAGNOSIS — R531 Weakness: Secondary | ICD-10-CM | POA: Diagnosis present

## 2023-02-14 DIAGNOSIS — E1122 Type 2 diabetes mellitus with diabetic chronic kidney disease: Secondary | ICD-10-CM | POA: Diagnosis present

## 2023-02-14 DIAGNOSIS — Z96641 Presence of right artificial hip joint: Secondary | ICD-10-CM | POA: Diagnosis present

## 2023-02-14 DIAGNOSIS — I4891 Unspecified atrial fibrillation: Secondary | ICD-10-CM | POA: Diagnosis present

## 2023-02-14 DIAGNOSIS — Z66 Do not resuscitate: Secondary | ICD-10-CM | POA: Diagnosis present

## 2023-02-14 DIAGNOSIS — I129 Hypertensive chronic kidney disease with stage 1 through stage 4 chronic kidney disease, or unspecified chronic kidney disease: Secondary | ICD-10-CM | POA: Diagnosis present

## 2023-02-14 DIAGNOSIS — E785 Hyperlipidemia, unspecified: Secondary | ICD-10-CM | POA: Diagnosis present

## 2023-02-14 DIAGNOSIS — N183 Chronic kidney disease, stage 3 unspecified: Secondary | ICD-10-CM | POA: Diagnosis present

## 2023-02-14 DIAGNOSIS — Z8249 Family history of ischemic heart disease and other diseases of the circulatory system: Secondary | ICD-10-CM | POA: Diagnosis not present

## 2023-02-14 DIAGNOSIS — K219 Gastro-esophageal reflux disease without esophagitis: Secondary | ICD-10-CM | POA: Diagnosis present

## 2023-02-14 DIAGNOSIS — Z1152 Encounter for screening for COVID-19: Secondary | ICD-10-CM | POA: Diagnosis not present

## 2023-02-14 DIAGNOSIS — Z905 Acquired absence of kidney: Secondary | ICD-10-CM | POA: Diagnosis not present

## 2023-02-14 DIAGNOSIS — G8929 Other chronic pain: Secondary | ICD-10-CM | POA: Diagnosis present

## 2023-02-14 DIAGNOSIS — Z7984 Long term (current) use of oral hypoglycemic drugs: Secondary | ICD-10-CM | POA: Diagnosis not present

## 2023-02-14 DIAGNOSIS — B9620 Unspecified Escherichia coli [E. coli] as the cause of diseases classified elsewhere: Secondary | ICD-10-CM | POA: Diagnosis present

## 2023-02-14 DIAGNOSIS — Z1611 Resistance to penicillins: Secondary | ICD-10-CM | POA: Diagnosis present

## 2023-02-14 DIAGNOSIS — Z7901 Long term (current) use of anticoagulants: Secondary | ICD-10-CM | POA: Diagnosis not present

## 2023-02-14 DIAGNOSIS — K589 Irritable bowel syndrome without diarrhea: Secondary | ICD-10-CM | POA: Diagnosis present

## 2023-02-14 DIAGNOSIS — Z86718 Personal history of other venous thrombosis and embolism: Secondary | ICD-10-CM | POA: Diagnosis not present

## 2023-02-14 DIAGNOSIS — J4489 Other specified chronic obstructive pulmonary disease: Secondary | ICD-10-CM | POA: Diagnosis present

## 2023-02-14 DIAGNOSIS — N3001 Acute cystitis with hematuria: Secondary | ICD-10-CM | POA: Diagnosis not present

## 2023-02-14 DIAGNOSIS — Z825 Family history of asthma and other chronic lower respiratory diseases: Secondary | ICD-10-CM | POA: Diagnosis not present

## 2023-02-14 DIAGNOSIS — Z85528 Personal history of other malignant neoplasm of kidney: Secondary | ICD-10-CM | POA: Diagnosis not present

## 2023-02-14 LAB — CBC
HCT: 42.1 % (ref 36.0–46.0)
Hemoglobin: 13.9 g/dL (ref 12.0–15.0)
MCH: 30.2 pg (ref 26.0–34.0)
MCHC: 33 g/dL (ref 30.0–36.0)
MCV: 91.5 fL (ref 80.0–100.0)
Platelets: 255 10*3/uL (ref 150–400)
RBC: 4.6 MIL/uL (ref 3.87–5.11)
RDW: 13.8 % (ref 11.5–15.5)
WBC: 7.6 10*3/uL (ref 4.0–10.5)
nRBC: 0 % (ref 0.0–0.2)

## 2023-02-14 LAB — BASIC METABOLIC PANEL
Anion gap: 9 (ref 5–15)
BUN: 20 mg/dL (ref 8–23)
CO2: 22 mmol/L (ref 22–32)
Calcium: 8.4 mg/dL — ABNORMAL LOW (ref 8.9–10.3)
Chloride: 106 mmol/L (ref 98–111)
Creatinine, Ser: 0.75 mg/dL (ref 0.44–1.00)
GFR, Estimated: >60 mL/min (ref >60)
Glucose, Bld: 126 mg/dL — ABNORMAL HIGH (ref 70–99)
Potassium: 3.5 mmol/L (ref 3.5–5.1)
Sodium: 137 mmol/L (ref 135–145)

## 2023-02-14 LAB — MAGNESIUM: Magnesium: 2.4 mg/dL (ref 1.7–2.4)

## 2023-02-14 MED ORDER — BISACODYL 10 MG RE SUPP
RECTAL | Status: AC
  Filled 2023-02-14: qty 1

## 2023-02-14 MED ORDER — ALUM & MAG HYDROXIDE-SIMETH 200-200-20 MG/5ML PO SUSP
30.0000 mL | ORAL | Status: DC | PRN
  Filled 2023-02-14: qty 30

## 2023-02-14 MED ORDER — BISACODYL 10 MG RE SUPP
10.0000 mg | Freq: Every day | RECTAL | Status: DC | PRN
  Administered 2023-02-14 – 2023-02-16 (×2): 10 mg via RECTAL
  Filled 2023-02-14: qty 1

## 2023-02-14 MED ORDER — NITROGLYCERIN 0.4 MG SL SUBL
SUBLINGUAL_TABLET | SUBLINGUAL | Status: AC
  Filled 2023-02-14: qty 1

## 2023-02-14 MED ORDER — SIMETHICONE 80 MG PO CHEW
80.0000 mg | CHEWABLE_TABLET | Freq: Four times a day (QID) | ORAL | Status: DC | PRN
  Administered 2023-02-14: 80 mg via ORAL
  Filled 2023-02-14: qty 1

## 2023-02-14 MED ORDER — MORPHINE SULFATE (PF) 2 MG/ML IV SOLN
2.0000 mg | Freq: Once | INTRAVENOUS | Status: AC
  Administered 2023-02-14: 2 mg via INTRAVENOUS
  Filled 2023-02-14: qty 1

## 2023-02-14 MED ORDER — NITROGLYCERIN 0.4 MG SL SUBL: 0.4000 mg | SUBLINGUAL_TABLET | SUBLINGUAL | Status: DC | PRN

## 2023-02-14 NOTE — Evaluation (Signed)
Physical Therapy Evaluation Patient Details Name: Alexa [name] MRN: [id number] DOB: [birth year] Today's Date: 02/14/2023  History of Present Illness  Pt is 89 yo female admitted on 02/13/23 with significant weakness and found to have UTI.  Pt with hx including COPD, HTN, renal carcinoma, s/p L nephrectomy, R THA 5/23  (after fall)  Clinical Impression  Pt admitted with above diagnosis. At baseline, pt resides alone and ambulates with rollator.  Today, pt requiring min guard for safety and ambulated 50' with RW.  She did report feeling somewhat unsteady/weak compared to baseline.  Pt reports trying to get increased assist at home with aides but has been unable.  States she would like to improve her mobility and is hoping to go to SNF at d/c.  States has had HHPT in past and it did not help.  Pt currently with functional limitations due to the deficits listed below (see PT Problem List). Pt will benefit from acute skilled PT to increase their independence and safety with mobility to allow discharge.  Due to living alone and fall risk Patient will benefit from continued inpatient follow up therapy, <3 hours/day          Assistance Recommended at Discharge Intermittent Supervision/Assistance  If plan is discharge home, recommend the following:  Can travel by private vehicle  A little help with walking and/or transfers;A little help with bathing/dressing/bathroom;Assistance with cooking/housework;Help with stairs or ramp for entrance   Yes    Equipment Recommendations None recommended by PT  Recommendations for Other Services       Functional Status Assessment Patient has had a recent decline in their functional status and demonstrates the ability to make significant improvements in function in a reasonable and predictable amount of time.     Precautions / Restrictions Precautions Precautions: Fall      Mobility  Bed Mobility Overal bed mobility: Needs Assistance              General bed mobility comments: Sitting EOB to eat at arrival    Transfers Overall transfer level: Needs assistance Equipment used: Rolling walker (2 wheels) Transfers: Sit to/from Stand Sit to Stand: Min guard           General transfer comment: min guard for safety - stood from bed and toielt; performed her own toielting ADLs    Ambulation/Gait Ambulation/Gait assistance: Min guard Gait Distance (Feet): 50 Feet Assistive device: Rolling walker (2 wheels) Gait Pattern/deviations: Step-through pattern, Decreased stride length Gait velocity: decreased     General Gait Details: Min guard safety; pt having to slow with turns (difficulty managing RW, used to rollator); reports feels a little "wobbly"  Stairs            Wheelchair Mobility     Tilt Bed    Modified Rankin (Stroke Patients Only)       Balance Overall balance assessment: Needs assistance Sitting-balance support: No upper extremity supported Sitting balance-Leahy Scale: Good     Standing balance support: Bilateral upper extremity supported, No upper extremity supported Standing balance-Leahy Scale: Fair Standing balance comment: RW to ambulate but did adls without UE support                             Pertinent Vitals/Pain Pain Assessment Pain Assessment: No/denies pain    Home Living Family/patient expects to be discharged to:: Private residence Living Arrangements: Alone Available Help at Discharge: Family;Available PRN/intermittently Type of Home: House Home  Access: Stairs to enter Entrance Stairs-Rails: Doctor, general practice of Steps: 5   Home Layout: One level Home Equipment: Tub bench;Rollator (4 wheels)      Prior Function Prior Level of Function : Needs assist             Mobility Comments: Ambulatory wtih rollator ADLs Comments: Does not drive; reports typically fixes easy meals and does her own adls; has a lady that helps 1 day a week with  higher level iadls     Hand Dominance        Extremity/Trunk Assessment   Upper Extremity Assessment Upper Extremity Assessment: Defer to OT evaluation    Lower Extremity Assessment Lower Extremity Assessment: RLE deficits/detail;LLE deficits/detail RLE Deficits / Details: ROM WFL; MMT 5/5 LLE Deficits / Details: ROM WFL; MMT 5/5    Cervical / Trunk Assessment Cervical / Trunk Assessment: Normal  Communication   Communication: No difficulties  Cognition Arousal/Alertness: Awake/alert Behavior During Therapy: WFL for tasks assessed/performed Overall Cognitive Status: Within Functional Limits for tasks assessed                                          General Comments General comments (skin integrity, edema, etc.): Reports that she would like to go to SNF to improve her mobility as she lives alone. Had HHPT in past and reports felt she did not benefit and does not drive to go to outpt.  States she has been trying to call to get more assistance at home.    Exercises     Assessment/Plan    PT Assessment Patient needs continued PT services  PT Problem List Decreased strength;Decreased mobility;Decreased activity tolerance;Decreased balance;Decreased knowledge of use of DME       PT Treatment Interventions DME instruction;Therapeutic activities;Modalities;Therapeutic exercise;Gait training;Stair training;Balance training;Functional mobility training;Patient/family education    PT Goals (Current goals can be found in the Care Plan section)  Acute Rehab PT Goals Patient Stated Goal: be able to walk more independently and get outside to her garden PT Goal Formulation: With patient Time For Goal Achievement: 02/28/23 Potential to Achieve Goals: Good    Frequency Min 1X/week     Co-evaluation               AM-PAC PT "6 Clicks" Mobility  Outcome Measure Help needed turning from your back to your side while in a flat bed without using bedrails?:  None Help needed moving from lying on your back to sitting on the side of a flat bed without using bedrails?: A Little Help needed moving to and from a bed to a chair (including a wheelchair)?: A Little Help needed standing up from a chair using your arms (e.g., wheelchair or bedside chair)?: A Little Help needed to walk in hospital room?: A Little Help needed climbing 3-5 steps with a railing? : A Little 6 Click Score: 19    End of Session Equipment Utilized During Treatment: Gait belt Activity Tolerance: Patient tolerated treatment well Patient left: in bed;with bed alarm set;with call bell/phone within reach Nurse Communication: Mobility status PT Visit Diagnosis: Other abnormalities of gait and mobility (R26.89)    Time: 5784-6962 PT Time Calculation (min) (ACUTE ONLY): 26 min   Charges:   PT Evaluation $PT Eval Low Complexity: 1 Low PT Treatments $Gait Training: 8-22 mins PT General Charges $$ ACUTE PT VISIT: 1 Visit  Anise Salvo, PT Acute Rehab [facility] Rehab [phone]   Rayetta Humphrey 02/14/2023, 2:15 PM

## 2023-02-14 NOTE — TOC Initial Note (Signed)
Transition of Care [facility]) - Initial/Assessment Note    Patient Details  Name: Alexa [name] MRN: [id number] Date of Birth: 05/26/1930  Transition of Care [facility]) CM/SW Contact:    Otelia Santee, LCSW Phone Number: 02/14/2023, 3:32 PM  Clinical Narrative:                 Met with pt in room to discuss recommendation for SNF placement. Pt shares she currently lives alone and has some support from family and friends. Pt uses rollator at baseline but, is hoping to improve to be able to ambulate independently. Pt shares she has [facility] spouse at [facility].  Referrals have been faxed out for SNF and currently awaiting bed offers.   Expected Discharge Plan: Skilled Nursing Facility Barriers to Discharge: SNF Pending bed offer   Patient Goals and CMS Choice Patient states their goals for this hospitalization and ongoing recovery are:: To be more independent again CMS Medicare.gov Compare Post Acute Care list provided to:: Patient Choice offered to / list presented to : Patient [place] ownership interest in [facility].provided to:: Patient    Expected Discharge Plan and Services In-house Referral: NA Discharge Planning Services: NA Post Acute Care Choice: Skilled Nursing Facility Living arrangements for the past 2 months: Single Family Home                 DME Arranged: N/A DME Agency: NA                  Prior Living Arrangements/Services Living arrangements for the past 2 months: Single Family Home Lives with:: Self Patient language and need for interpreter reviewed:: Yes Do you feel safe going back to the place where you live?: Yes      Need for Family Participation in Patient Care: No (Comment) Care giver support system in place?: Yes (comment) Current home services: DME (rollator) Criminal Activity/Legal Involvement Pertinent to Current Situation/Hospitalization: No - Comment as needed  Activities of Daily  Living Home Assistive Devices/Equipment: Walker (specify type) ADL Screening (condition at time of admission) Patient's cognitive ability adequate to safely complete daily activities?: Yes Is the patient deaf or have difficulty hearing?: No Does the patient have difficulty seeing, even when wearing glasses/contacts?: No Does the patient have difficulty concentrating, remembering, or making decisions?: No Patient able to express need for assistance with ADLs?: Yes Does the patient have difficulty dressing or bathing?: No Independently performs ADLs?: Yes (appropriate for developmental age) Does the patient have difficulty walking or climbing stairs?: No Weakness of Legs: None Weakness of Arms/Hands: None  Permission Sought/Granted Permission sought to share information with : Facility Industrial/product designer granted to share information with : Yes, Verbal Permission Granted     Permission granted to share info w AGENCY: SNF's        Emotional Assessment Appearance:: Appears stated age Attitude/Demeanor/Rapport: Engaged Affect (typically observed): Accepting Orientation: : Oriented to Self, Oriented to Place, Oriented to  Time, Oriented to Situation Alcohol / Substance Use: Not Applicable Psych Involvement: No (comment)  Admission diagnosis:  Weakness [R53.1] Urinary tract infection [N39.0] Urinary tract infection without hematuria, site unspecified [N39.0] Patient Active Problem List   Diagnosis Date Noted   Urinary tract infection 02/13/2023   COPD (chronic obstructive pulmonary disease) (HCC) 02/13/2023   Generalized weakness 02/13/2023   Acute postoperative anemia due to expected blood loss 12/17/2021   Closed right hip fracture (HCC) 12/15/2021   Stage  3a chronic kidney disease (CKD) (HCC) 12/15/2021   Acute urinary retention 12/15/2021   Acute cystitis without hematuria    Elevated troponin    Allergy to alpha-gal 12/18/2020   Other adverse food reactions,  not elsewhere classified, subsequent encounter 12/18/2020   Rash and other nonspecific skin eruption 11/21/2020   Seasonal and perennial allergic rhinitis 11/21/2020   Solitary kidney 12/15/2018   Cough variant asthma 12/29/2014   Spinal stenosis in cervical region 12/13/2014   Radiculopathy, lumbar region 12/13/2014   Depression 05/03/2008   PERONEAL NEUROPATHY 05/03/2008   Malignant neoplasm of kidney excluding renal pelvis (HCC) 08/06/2007   Controlled type 2 diabetes mellitus without complication, without long-term current use of insulin (HCC) 08/06/2007   GERD 08/06/2007   PULMONARY EMBOLISM, HX OF 08/06/2007   Hyperlipidemia 09/03/2006   Essential hypertension 09/03/2006   IBS 09/03/2006   PCP:  Charlane Ferretti, DO Pharmacy:   [facility] [phone] - [place], [place] - [address] AT [facility] [address] [place] [address] Phone: [phone] Fax: [phone]  [facility] - [place], [place] - [address] [place] [address] Phone: [phone] Fax: [phone]  [facility] #[id number] [place], [place] - [address] AT [facility] OF [facility] & [address] [place] [address] Phone: [phone] Fax: [phone]     Social Determinants of Health (SDOH) Social History: SDOH Screenings   Food Insecurity: No Food Insecurity (10/22/2019)  Housing: Patient Declined (02/14/2023)  Transportation Needs: No Transportation Needs (10/22/2019)  Alcohol Screen: Low Risk  (04/03/2018)  Depression (PHQ2-9): Low Risk  (04/07/2020)  Financial Resource Strain: Low Risk  (08/08/2020)  Tobacco Use: Low Risk  (02/13/2023)   SDOH Interventions:     Readmission Risk Interventions    12/17/2021    1:08 PM  Readmission Risk Prevention Plan  Transportation Screening Complete  PCP or Specialist Appt within 5-7 Days Complete  Home Care Screening Complete  Medication Review (RN CM)  Complete

## 2023-02-14 NOTE — [place] FL2 (Signed)
[place] MEDICAID FL2 LEVEL OF CARE FORM     IDENTIFICATION  Patient Name: Alexa [name] Birthdate: May 15, 1930 Sex: female Admission Date (Current Location): 02/13/2023  [facility] and [place] Number:  Producer, television/film/video and Address:  [facility],  [address] [place], [place] [address]      Provider Number: [id number]  Attending Physician Name and Address:  Leatha Gilding, MD  Relative Name and Phone Number:  Youlanda Roys P[place]ix [phone]    Current Level of Care: Hospital Recommended Level of Care: Skilled Nursing Facility Prior Approval Number:    Date Approved/Denied:   PASRR Number: [id number]  Discharge Plan: SNF    Current Diagnoses: Patient Active Problem List   Diagnosis Date Noted   Urinary tract infection 02/13/2023   COPD (chronic obstructive pulmonary disease) (HCC) 02/13/2023   Generalized weakness 02/13/2023   Acute postoperative anemia due to expected blood loss 12/17/2021   Closed right hip fracture (HCC) 12/15/2021   Stage 3a chronic kidney disease (CKD) (HCC) 12/15/2021   Acute urinary retention 12/15/2021   Acute cystitis without hematuria    Elevated troponin    Allergy to alpha-gal 12/18/2020   Other adverse food reactions, not elsewhere classified, subsequent encounter 12/18/2020   Rash and other nonspecific skin eruption 11/21/2020   Seasonal and perennial allergic rhinitis 11/21/2020   Solitary kidney 12/15/2018   Cough variant asthma 12/29/2014   Spinal stenosis in cervical region 12/13/2014   Radiculopathy, lumbar region 12/13/2014   Depression 05/03/2008   PERONEAL NEUROPATHY 05/03/2008   Malignant neoplasm of kidney excluding renal pelvis (HCC) 08/06/2007   Controlled type 2 diabetes mellitus without complication, without long-term current use of insulin (HCC) 08/06/2007   GERD 08/06/2007   PULMONARY EMBOLISM, HX OF 08/06/2007   Hyperlipidemia 09/03/2006   Essential hypertension 09/03/2006   IBS 09/03/2006     Orientation RESPIRATION BLADDER Height & Weight     Self, Time, Situation, Place  Normal Incontinent Weight: 52.2 kg Height:  5\' 1"  (154.9 cm)  BEHAVIORAL SYMPTOMS/MOOD NEUROLOGICAL BOWEL NUTRITION STATUS      Continent Diet (See discharge summary)  AMBULATORY STATUS COMMUNICATION OF NEEDS Skin   Limited Assist Verbally Normal                       Personal Care Assistance Level of Assistance  Bathing, Feeding, Dressing Bathing Assistance: Limited assistance Feeding assistance: Independent Dressing Assistance: Limited assistance     Functional Limitations Info  Sight, Hearing, Speech Sight Info: Impaired Hearing Info: Adequate Speech Info: Adequate    SPECIAL CARE FACTORS FREQUENCY  PT (By licensed PT), OT (By licensed OT)     PT Frequency: 5x/wk OT Frequency: 5x/wk            Contractures Contractures Info: Not present    Additional Factors Info  Code Status, Allergies Code Status Info: DNR Allergies Info: Lisinopril, Codeine, Verapamil           Current Medications (02/14/2023):  This is the current hospital active medication list Current Facility-Administered Medications  Medication Dose Route Frequency Provider Last Rate Last Admin   acetaminophen (TYLENOL) tablet 650 mg  650 mg Oral Q6H PRN Charlsie Quest, MD   650 mg at 02/14/23 0815   Or   acetaminophen (TYLENOL) suppository 650 mg  650 mg Rectal Q6H PRN Charlsie Quest, MD       albuterol (PROVENTIL) (2.5 MG/3ML) 0.083% nebulizer solution 2.5 mg  2.5 mg Nebulization Q4H PRN Fayrene Fearing,  Melissa, RPH       amLODipine (NORVASC) tablet 10 mg  10 mg Oral Daily Darreld Mclean R, MD   10 mg at 02/14/23 0948   bisacodyl (DULCOLAX) suppository 10 mg  10 mg Rectal Daily PRN Leatha Gilding, MD   10 mg at 02/14/23 0825   cefTRIAXone (ROCEPHIN) 1 g in sodium chloride 0.9 % 100 mL IVPB  1 g Intravenous Q24H Darreld Mclean R, MD       enoxaparin (LOVENOX) injection 40 mg  40 mg Subcutaneous Q24H Darreld Mclean  R, MD   40 mg at 02/13/23 2249   HYDROcodone-acetaminophen (NORCO/VICODIN) 5-325 MG per tablet 1 tablet  1 tablet Oral Q6H PRN Charlsie Quest, MD   1 tablet at 02/14/23 1220   hydrOXYzine (ATARAX) tablet 10 mg  10 mg Oral Daily PRN Charlsie Quest, MD       labetalol (NORMODYNE) injection 10 mg  10 mg Intravenous Q4H PRN Darreld Mclean R, MD   10 mg at 02/13/23 2100   ondansetron (ZOFRAN) tablet 4 mg  4 mg Oral Q6H PRN Charlsie Quest, MD       Or   ondansetron (ZOFRAN) injection 4 mg  4 mg Intravenous Q6H PRN Charlsie Quest, MD       senna-docusate (Senokot-S) tablet 1 tablet  1 tablet Oral QHS PRN Darreld Mclean R, MD       sodium chloride flush (NS) 0.9 % injection 3 mL  3 mL Intravenous Q12H Charlsie Quest, MD   3 mL at 02/14/23 4098     Discharge Medications: Please see discharge summary for a list of discharge medications.  Relevant Imaging Results:  Relevant Lab Results:   Additional Information SS# [id number]  Otelia Santee, LCSW

## 2023-02-14 NOTE — Evaluation (Signed)
Occupational Therapy Evaluation Patient Details Name: Alexa [name] MRN: [id number] DOB: [birth year] Today's Date: 02/14/2023   History of Present Illness Pt is 89 yo female admitted on 02/13/23 with significant weakness and found to have suspected UTI.  Pt with hx including COPD, HTN, renal carcinoma, s/p L nephrectomy, R THA 5/23  (after fall)   Clinical Impression   The pt is currently limited by the below listed deficits (see OT problem list), which compromises her ADL performance and overall functional independence. She reports feelings of ongoing weakness and fatigue which has worsened over the past ~1 yr ago; prior to that, she reported being much more active and energetic, and being able to perform tasks such as gardening regularly & cooking full course meals. She continues to reports feelings of increased fatigue and generalized weakness, and stated she would like to pursue short term SNF rehab, to help her achieve her maximal functional independence and optimal ADL/IADL performance. OT will continue to follow her for further services in the acute setting. Patient will benefit from continued inpatient follow up therapy, <3 hours/day.      Recommendations for follow up therapy are one component of a multi-disciplinary discharge planning process, led by the attending physician.  Recommendations may be updated based on patient status, additional functional criteria and insurance authorization.   Assistance Recommended at Discharge Intermittent Supervision/Assistance  Patient can return home with the following Assist for transportation;Assistance with cooking/housework;A little help with bathing/dressing/bathroom;Help with stairs or ramp for entrance    Functional Status Assessment  Patient has had a recent decline in their functional status and demonstrates the ability to make significant improvements in function in a reasonable and predictable amount of time.  Equipment Recommendations   Other (comment) (defer to next level of care)    Recommendations for Other Services       Precautions / Restrictions Precautions Precautions: Fall Restrictions Weight Bearing Restrictions: No      Mobility Bed Mobility      General bed mobility comments: Pt was received seated in the bedside chair    Transfers Overall transfer level: Needs assistance Equipment used: Rolling walker (2 wheels) Transfers: Sit to/from Stand Sit to Stand: Min guard                  Balance Overall balance assessment: Needs assistance   Sitting balance-Leahy Scale: Good       Standing balance-Leahy Scale: Fair            ADL either performed or assessed with clinical judgement   ADL Overall ADL's : Needs assistance/impaired Eating/Feeding: Independent;Sitting   Grooming: Min guard;Standing Grooming Details (indicate cue type and reason): at sink level         Upper Body Dressing : Set up;Sitting   Lower Body Dressing: Min guard;Sit to/from stand   Toilet Transfer: Min guard;Regular Toilet;Rolling walker (2 wheels);Ambulation   Toileting- Clothing Manipulation and Hygiene: Min guard;Sit to/from stand               Vision   Additional Comments: She correctly read the time depicted on the wall clock.            Pertinent Vitals/Pain Pain Assessment Pain Assessment: No/denies pain     Hand Dominance Right   Extremity/Trunk Assessment Upper Extremity Assessment Upper Extremity Assessment: Overall WFL for tasks assessed. Arthritic changes of hands noted    Lower Extremity Assessment Lower Extremity Assessment: Overall WFL for tasks assessed      Communication  Communication Communication: No difficulties   Cognition Arousal/Alertness: Awake/alert Behavior During Therapy: WFL for tasks assessed/performed Overall Cognitive Status: Within Functional Limits for tasks assessed            General Comments: Oriented x4, pleasant, able to follow  commands without difficulty     General Comments  Reports that she would like to go to SNF to improve her mobility as she lives alone. Had HHPT in past and reports felt she did not benefit and does not drive to go to outpt.  States she has been trying to call to get more assistance at home.            Home Living Family/patient expects to be discharged to:: Private residence Living Arrangements: Alone Available Help at Discharge: Family;Available PRN/intermittently Type of Home: House Home Access: Stairs to enter [employer] of Steps: 5 Entrance Stairs-Rails: Right;Left Home Layout: One level     Bathroom Shower/Tub: Tub/shower unit         Home Equipment: Tub bench;Rollator (4 wheels);BSC/3in1          Prior Functioning/Environment Prior Level of Function : Independent/Modified Independent             Mobility Comments: Ambulatory wtih rollator ADLs Comments: She does not drive anymore; reports typically fixes easy meals and does her own adls; has a lady that helps 1 day a week with higher level iadls. Prior to ~1 yr ago, she reported being much more active, performing such tasks as gardening and cooking full course meals.         OT Problem List: Decreased strength;Decreased activity tolerance;Impaired balance (sitting and/or standing);Decreased knowledge of use of DME or AE      OT Treatment/Interventions: Self-care/ADL training;Therapeutic exercise;Energy conservation;DME and/or AE instruction;Therapeutic activities;Balance training;Patient/family education    OT Goals(Current goals can be found in the care plan section) Acute Rehab OT Goals Patient Stated Goal: To go to SNF rehab so she can regain her full strength and endurance OT Goal Formulation: With patient Time For Goal Achievement: 02/28/23 Potential to Achieve Goals: Good ADL Goals Pt Will Perform Grooming: with modified independence;standing Pt Will Perform Lower Body Dressing: with  modified independence;sit to/from stand Pt Will Transfer to Toilet: with modified independence;ambulating;grab bars Pt Will Perform Toileting - Clothing Manipulation and hygiene: with modified independence;sit to/from stand  OT Frequency: Min 1X/week       AM-PAC OT "6 Clicks" Daily Activity     Outcome Measure Help from another person eating meals?: None Help from another person taking care of personal grooming?: A Little Help from another person toileting, which includes using toliet, bedpan, or urinal?: A Little Help from another person bathing (including washing, rinsing, drying)?: A Little Help from another person to put on and taking off regular upper body clothing?: A Little Help from another person to put on and taking off regular lower body clothing?: A Little 6 Click Score: 19   End of Session Equipment Utilized During Treatment: Gait belt;Rolling walker (2 wheels) Nurse Communication: Mobility status  Activity Tolerance: Patient limited by fatigue Patient left: in chair;with call bell/phone within reach;with chair alarm set  OT Visit Diagnosis: Unsteadiness on feet (R26.81);Muscle weakness (generalized) (M62.81)                Time: 5366-4403 OT Time Calculation (min): 36 min Charges:  OT General Charges $OT Visit: 1 Visit OT Evaluation $OT Eval Low Complexity: 1 Low OT Treatments $Therapeutic Activity: 8-22 mins    Barron Alvine  Karsten Ro, OTR/L 02/14/2023, 5:19 PM

## 2023-02-14 NOTE — Progress Notes (Signed)
PROGRESS NOTE  Alexa [name] ZOX:[id number] DOB: [birth year] DOA: 02/13/2023 PCP: Charlane Ferretti, DO   LOS: 0 days   Brief Narrative / Interim history: Alexa [name] is a 89 y.o. female with medical history significant for COPD, HTN, renal carcinoma s/p left nephrectomy, s/p right THA 11/2021 who presented to the ED for evaluation of dysuria and generalized weakness. She was found to have a UTI, cultures were sent and she was admitted to the hospital.   Subjective / 24h Interval events: Continues to complains of significant weakness this morning. Has persistent dysuria.   Assesement and Plan: Principal Problem:   Urinary tract infection Active Problems:   Generalized weakness   Essential hypertension   COPD (chronic obstructive pulmonary disease) (HCC)   Principal problem UTI - with significant weakness still today. Lives alone, not strong enough to go home yet. PT consult pending -continue antibiotics while monitoring cultures.   Active problems COPD - stable, on room air, no wheezing  HTN - continue amlodipine  PACs - no A fib noted  Chronic pain - continue home norco  OA, hip replacement 11/2021 - PT eval  Scheduled Meds:  amLODipine  10 mg Oral Daily   enoxaparin (LOVENOX) injection  40 mg Subcutaneous Q24H   sodium chloride flush  3 mL Intravenous Q12H   Continuous Infusions:  cefTRIAXone (ROCEPHIN)  IV     PRN Meds:.acetaminophen **OR** acetaminophen, albuterol, bisacodyl, HYDROcodone-acetaminophen, hydrOXYzine, labetalol, ondansetron **OR** ondansetron (ZOFRAN) IV, senna-docusate  Current Outpatient Medications  Medication Instructions   amLODipine (NORVASC) 5 mg, Oral, Daily   Benzalkonium Chloride (DIABETIC BASICS HEALTHY FOOT EX) 1 application , Topical, Daily PRN, OTC diabetic foot cream    CRANBERRY PO 2 capsules, Oral, 2 times daily, 42mg    ESTRACE VAGINAL 0.1 MG/GM vaginal cream Apply locally every other night at bedtime   HYDROcodone-acetaminophen  (NORCO/VICODIN) 5-325 MG tablet 1 tablet, Oral, Every 6 hours PRN   hydrOXYzine (ATARAX) 10 mg, Oral, Daily PRN   Multiple Vitamins-Minerals (EYE VITAMINS PO) Oral, AREDS2   sitaGLIPtin (JANUVIA) 50 mg, Oral, Daily   Vitamin D (Ergocalciferol) (DRISDOL) 50,000 Units, Oral, Weekly    Diet Orders (From admission, onward)     Start     Ordered   02/13/23 2048  Diet Heart Room service appropriate? Yes; Fluid consistency: Thin  Diet effective now       Question Answer Comment  Room service appropriate? Yes   Fluid consistency: Thin      02/13/23 2047            DVT prophylaxis: enoxaparin (LOVENOX) injection 40 mg Start: 02/13/23 2200   Lab Results  Component Value Date   PLT 255 02/14/2023      Code Status: DNR  Family Communication: no family at bedside   Status is: Observation The patient will require care spanning > 2 midnights and should be moved to inpatient because: Iv antibiotics, persistent weakness   Level of care: Telemetry  Consultants:  none  Objective: Vitals:   02/13/23 2133 02/13/23 2258 02/14/23 0210 02/14/23 0625  BP: (!) 142/91 (!) 142/91 (!) 145/88 (!) 153/93  Pulse: 74 75 69 79  Resp: 16 18    Temp: 98.4 F (36.9 C) 98.4 F (36.9 C) 97.6 F (36.4 C) 98.2 F (36.8 C)  TempSrc: Oral Oral Oral Oral  SpO2: 98% 98% 99% 98%  Weight:  52.2 kg    Height:  5\' 1"  (1.549 m)      Intake/Output Summary (Last 24 hours)  at 02/14/2023 0919 Last data filed at 02/14/2023 0657 Gross per 24 hour  Intake --  Output 500 ml  Net -500 ml   Wt Readings from Last 3 Encounters:  02/13/23 52.2 kg  10/17/22 52.2 kg  07/07/22 53.5 kg    Examination:  Constitutional: NAD Eyes: no scleral icterus ENMT: Mucous membranes are moist.  Neck: normal, supple Respiratory: clear to auscultation bilaterally, no wheezing, no crackles. Normal respiratory effort. No accessory muscle use.  Cardiovascular: Regular rate and rhythm, no murmurs / rubs / gallops. No LE  edema.  Abdomen: non distended, no tenderness. Bowel sounds positive.  Musculoskeletal: no clubbing / cyanosis.   Data Reviewed: I have independently reviewed following labs and imaging studies   CBC Recent Labs  Lab 02/13/23 1834 02/14/23 0600  WBC 8.2 7.6  HGB 15.0 13.9  HCT 44.3 42.1  PLT 267 255  MCV 90.0 91.5  MCH 30.5 30.2  MCHC 33.9 33.0  RDW 13.8 13.8  LYMPHSABS 2.3  --   MONOABS 0.6  --   EOSABS 0.6*  --   BASOSABS 0.1  --     Recent Labs  Lab 02/13/23 1834  NA 138  K 3.8  CL 106  CO2 24  GLUCOSE 107*  BUN 22  CREATININE 0.84  CALCIUM 8.6*  AST 17  ALT 14  ALKPHOS 61  BILITOT 0.8  ALBUMIN 3.8  TSH 1.386    ------------------------------------------------------------------------------------------------------------------ No results for input(s): "CHOL", "HDL", "LDLCALC", "TRIG", "CHOLHDL", "LDLDIRECT" in the last 72 hours.  Lab Results  Component Value Date   HGBA1C 6.2 (H) 12/14/2021   ------------------------------------------------------------------------------------------------------------------ Recent Labs    02/13/23 1834  TSH 1.386    Cardiac Enzymes No results for input(s): "CKMB", "TROPONINI", "MYOGLOBIN" in the last 168 hours.  Invalid input(s): "CK" ------------------------------------------------------------------------------------------------------------------    Component Value Date/Time   BNP 104.3 (H) 11/17/2019 1549    CBG: No results for input(s): "GLUCAP" in the last 168 hours.  Recent Results (from the past 240 hour(s))  SARS Coronavirus 2 by RT PCR (hospital order, performed in [facility] hospital lab) *cepheid single result test* Anterior Nasal Swab     Status: None   Collection Time: 02/13/23  5:44 PM   Specimen: Anterior Nasal Swab  Result Value Ref Range Status   SARS Coronavirus 2 by RT PCR NEGATIVE NEGATIVE Final    Comment: (NOTE) SARS-CoV-2 target nucleic acids are NOT DETECTED.  The SARS-CoV-2 RNA is  generally detectable in upper and lower respiratory specimens during the acute phase of infection. The lowest concentration of SARS-CoV-2 viral copies this assay can detect is 250 copies / mL. A negative result does not preclude SARS-CoV-2 infection and should not be used as the sole basis for treatment or other patient management decisions.  A negative result may occur with improper specimen collection / handling, submission of specimen other than nasopharyngeal swab, presence of viral mutation(s) within the areas targeted by this assay, and inadequate number of viral copies (<250 copies / mL). A negative result must be combined with clinical observations, patient history, and epidemiological information.  Fact Sheet for Patients:   RoadLapTop.co.za  Fact Sheet for Healthcare Providers: http://kim-miller.com/  This test is not yet approved or  cleared by the [place] FDA and has been authorized for detection and/or diagnosis of SARS-CoV-2 by FDA under an Emergency Use Authorization (EUA).  This EUA will remain in effect (meaning this test can be used) for the duration of the COVID-19 declaration under Section 564(b)(1)  of the Act, 21 U.S.C. section 360bbb-3(b)(1), unless the authorization is terminated or revoked sooner.  Performed at [facility], [address], [place], [place] [address]      Radiology Studies: DG Chest Portable 1 View  Result Date: 02/13/2023 CLINICAL DATA:  Generalized weakness. Chest tightness and sputum since 1 week ago. Hypertension. EXAM: PORTABLE CHEST 1 VIEW COMPARISON:  12/14/2021 FINDINGS: Heart size and pulmonary vascularity are normal. Calcified and ectatic thoracic aorta similar to prior study. Emphysematous changes are suggested in the lungs. No airspace disease or consolidation. No pleural effusions. No pneumothorax. Degenerative changes in the spine and shoulders. IMPRESSION: 1.  Emphysematous changes in the lungs. No evidence of active pulmonary disease. 2. Calcified and ectatic thoracic aorta. Electronically Signed   By: Burman Nieves M.D.   On: 02/13/2023 18:14     Pamella Pert, MD, PhD [facility]  Between 7 am - 7 pm I am available, please contact me via Amion (for emergencies) or Securechat (non urgent messages)  Between 7 pm - 7 am I am not available, please contact night coverage MD/APP via Amion

## 2023-02-15 DIAGNOSIS — N3001 Acute cystitis with hematuria: Secondary | ICD-10-CM | POA: Diagnosis not present

## 2023-02-15 LAB — TROPONIN I (HIGH SENSITIVITY)
Troponin I (High Sensitivity): 16 ng/L (ref <18)
Troponin I (High Sensitivity): 20 ng/L — ABNORMAL HIGH (ref <18)

## 2023-02-15 LAB — CBC
HCT: 42.3 % (ref 36.0–46.0)
Hemoglobin: 14 g/dL (ref 12.0–15.0)
MCH: 29.9 pg (ref 26.0–34.0)
MCHC: 33.1 g/dL (ref 30.0–36.0)
MCV: 90.2 fL (ref 80.0–100.0)
Platelets: 272 10*3/uL (ref 150–400)
RBC: 4.69 MIL/uL (ref 3.87–5.11)
RDW: 13.8 % (ref 11.5–15.5)
WBC: 7.8 10*3/uL (ref 4.0–10.5)
nRBC: 0 % (ref 0.0–0.2)

## 2023-02-15 LAB — URINE CULTURE

## 2023-02-15 LAB — COMPREHENSIVE METABOLIC PANEL
ALT: 12 U/L (ref 0–44)
AST: 15 U/L (ref 15–41)
Albumin: 3.4 g/dL — ABNORMAL LOW (ref 3.5–5.0)
Alkaline Phosphatase: 52 U/L (ref 38–126)
Anion gap: 8 (ref 5–15)
BUN: 23 mg/dL (ref 8–23)
CO2: 24 mmol/L (ref 22–32)
Calcium: 8.9 mg/dL (ref 8.9–10.3)
Chloride: 106 mmol/L (ref 98–111)
Creatinine, Ser: 0.76 mg/dL (ref 0.44–1.00)
GFR, Estimated: >60 mL/min (ref >60)
Glucose, Bld: 127 mg/dL — ABNORMAL HIGH (ref 70–99)
Potassium: 4.2 mmol/L (ref 3.5–5.1)
Sodium: 138 mmol/L (ref 135–145)
Total Bilirubin: 0.9 mg/dL (ref 0.3–1.2)
Total Protein: 6.3 g/dL — ABNORMAL LOW (ref 6.5–8.1)

## 2023-02-15 LAB — MAGNESIUM: Magnesium: 2.2 mg/dL (ref 1.7–2.4)

## 2023-02-15 MED ORDER — MELATONIN 3 MG PO TABS
3.0000 mg | ORAL_TABLET | Freq: Once | ORAL | Status: AC
  Administered 2023-02-15: 3 mg via ORAL
  Filled 2023-02-15: qty 1

## 2023-02-15 MED ORDER — SODIUM CHLORIDE 0.9 % IV BOLUS
1000.0000 mL | Freq: Once | INTRAVENOUS | Status: AC
  Administered 2023-02-15: 1000 mL via INTRAVENOUS

## 2023-02-15 NOTE — TOC Progression Note (Signed)
Transition of Care [facility]) - Progression Note    Patient Details  Name: Alexa [name] MRN: [id number] Date of Birth: [birth year]  Transition of [facility]) CM/SW Contact  Otelia Santee, LCSW Phone Number: 02/15/2023, 2:44 PM  Clinical Narrative:    Spoke with pt via t/c to room to review bed offers. Pt has accepted offer for [facility] Pt may be medically ready to transfer on Monday 7/22.  Insurance auth requested and approved for start of service from 7/22-7/24.   TOC will follow for medical readiness.    Expected Discharge Plan: Skilled Nursing Facility Barriers to Discharge: SNF Pending bed offer  Expected Discharge Plan and Services In-house Referral: NA Discharge Planning Services: NA Post Acute Care Choice: Skilled Nursing Facility Living arrangements for the past 2 months: Single Family Home                 DME Arranged: N/A DME Agency: NA                   Social Determinants of Health (SDOH) Interventions SDOH Screenings   Food Insecurity: No Food Insecurity (10/22/2019)  Housing: Patient Declined (02/15/2023)  Transportation Needs: No Transportation Needs (10/22/2019)  Alcohol Screen: Low Risk  (04/03/2018)  Depression (PHQ2-9): Low Risk  (04/07/2020)  Financial Resource Strain: Low Risk  (08/08/2020)  Tobacco Use: Low Risk  (02/13/2023)    Readmission Risk Interventions    12/17/2021    1:08 PM  Readmission Risk Prevention Plan  Transportation Screening Complete  PCP or Specialist Appt within 5-7 Days Complete  Home Care Screening Complete  Medication Review (RN CM) Complete

## 2023-02-15 NOTE — Progress Notes (Signed)
    Patient Name: [name]           DOB: February 11, 1930  MRN: [id number]      Admission Date: 02/13/2023  Attending Provider: Leatha Gilding, MD  Primary Diagnosis: Urinary tract infection   Level of care: Telemetry    CROSS COVER NOTE   Date of Service   02/15/2023   Alexa [name], 89 y.o. female, was admitted on 02/13/2023 for Urinary tract infection.    HPI/Events of Note   Uncontrolled pain Chronic pain Generalized pain, 10/10. Pain located in chest and abdomen.  Looks uncomfortable and anxious. Endorses SOB.  Denies palpitations, nausea, vomiting. Describes chest pain as pressure-like, heartburn.  Pain not reproduced with palpation.   Abdomen distended, but soft with active bowel sounds.  Tenderness over bladder.  Endorses frequently voiding low-volume urine.  Last BM was today, small and loose.   Unable to obtain reading from bladder scan, but suspect retention. In/out cath removed 600 cc.  Felt relief after In-N-Out. 12-lead EKG--> sinus tachycardia, HR 100s, with frequent PACs.  No ST segment changes noted.   Interventions/ Plan   EKG Troponin- negative  Maalox, simethicone IV morphine, 2 mg Chest x-ray, KUB        Anthoney Harada, DNP, ACNPC- AG [facility]

## 2023-02-15 NOTE — Progress Notes (Addendum)
Rapid Response Event Note   Reason for Call : Pt having chest pain 10/10   Initial Focused Assessment:  Pt lying bed, seeming to be very uncomfortable.  Pt A/O and able to F/C.  Pt rates her pain 20/10.  However,  pt states her pain is all over especially in her stomach.  Pt is very anxious and complaining of pain.  She says her pian in her chest is pressure and heaviness.  Pt also states she is SOB.  Pt VS, see flow sheet.  B/P elevated.  Pt Abdomen distended, soft with active bowel sounds.   Interventions: EKG and O2 applied per protocol.  Bladder scan not revealing.  NP at bedside relaying orders see MAR.  I/O cath completed    Plan of Care: Pt will remain current location per NP.  Pt seems to be feeling much better after morphine IVP  2mg   and  I/O cath per NP orders.    Event Summary:   MD Notified: Yes Call Time: 2228 Arrival Time: 2231 End Time: 2314  Conley Rolls, RN

## 2023-02-15 NOTE — Progress Notes (Signed)
Pt stating " I feel like my only kidney is failing, I need cardazin, I need to call my family, send the Dr."  Charge RN made pt aware Dr. Eulah Citizen be rounding soon. V/S WDL

## 2023-02-15 NOTE — Progress Notes (Signed)
   02/14/23 2251  Vitals  Temp 97.8 F (36.6 C)  Temp Source Oral  BP (!) 175/97  BP Location Left Arm  BP Method Automatic  Patient Position (if appropriate) Lying  Pulse Rate (!) 108  Pulse Rate Source Monitor  ECG Heart Rate (!) 106  Resp (!) 33  Level of Consciousness  Level of Consciousness Alert  MEWS COLOR  MEWS Score Color Yellow  Pain Assessment  Pain Scale 0-10  Pain Score 4  Complaints & Interventions  Complains of Other (Comment) (chest/abd pain)  MEWS Score  MEWS Temp 0  MEWS Systolic 0  MEWS Pulse 1  MEWS RR 2  MEWS LOC 0  MEWS Score 3  Provider Notification  Provider Name/Title Liana Crocker  Date Provider Notified 02/14/23  Time Provider Notified 2217  Method of Notification Page  Notification Reason Other (Comment) (Yellow mEWS/chest pain)  Provider response En route  Date of Provider Response 02/14/23  Time of Provider Response 2231  Rapid Response Notification  Name of Rapid Response RN Notified janel,RN  Date Rapid Response Notified 02/14/23  Time Rapid Response Notified 2220  Note  Patient Observations see note   Rapid was called d/t pt c/o chest pain/tightness/ sob radiating to right shoulder attempted to give mylaanta pt refused.Marland Kitchen eKG was done as well as v/s. [place] was places 4 L sat 91 RA. Then c/o stomach pain abd distention and feeling like needing to urinate but not being able to. Morphine and mylicon drops administered. Bladder scan was attempted , was not able to get a good read, I&0 cath was performed 600 ml light yellow. Pt felt better.

## 2023-02-15 NOTE — Progress Notes (Signed)
Mobility Specialist - Progress Note   02/15/23 1121  Mobility  Activity Ambulated with assistance in hallway  Level of Assistance Standby assist, set-up cues, supervision of patient - no hands on  Assistive Device Front wheel walker  Distance Ambulated (ft) 275 ft  Activity Response Tolerated well  Mobility Referral Yes  $Mobility charge 1 Mobility  Mobility Specialist Start Time (ACUTE ONLY) 1100  Mobility Specialist Stop Time (ACUTE ONLY) 1121  Mobility Specialist Time Calculation (min) (ACUTE ONLY) 21 min   Pt received in recliner and agreeable to mobility. No complaints during session. Pt to bed after session with all needs met & nurse in room.   [facility]

## 2023-02-15 NOTE — Plan of Care (Signed)

## 2023-02-15 NOTE — Progress Notes (Signed)
PROGRESS NOTE  Alexa [name] AOZ:[id number] DOB: [birth year] DOA: 02/13/2023 PCP: Charlane Ferretti, DO   LOS: 1 day   Brief Narrative / Interim history: Alexa [name] is a 89 y.o. female with medical history significant for COPD, HTN, renal carcinoma s/p left nephrectomy, s/p right THA 11/2021 who presented to the ED for evaluation of dysuria and generalized weakness. She was found to have a UTI, cultures were sent and she was admitted to the hospital.   Subjective / 24h Interval events: Had a rough night last night, felt a lot of abdominal and chest fullness, was found to have significant urinary retention requiring an In-N-Out cath  Assesement and Plan: Principal Problem:   Urinary tract infection Active Problems:   Generalized weakness   Essential hypertension   COPD (chronic obstructive pulmonary disease) (HCC)   UTI (urinary tract infection)   Principal problem UTI - with significant weakness still, SNF has been recommended and she is agreeable. Lives alone, not strong enough to go home yet -continue antibiotics while monitoring cultures.  -With urinary retention now, continue to monitor today.  Will give limited IV fluids  Active problems COPD - stable, on room air, no wheezing  HTN - continue amlodipine  PACs - no A fib noted  Chronic pain - continue home norco  OA, hip replacement 11/2021 -noted  Scheduled Meds:  amLODipine  10 mg Oral Daily   enoxaparin (LOVENOX) injection  40 mg Subcutaneous Q24H   sodium chloride flush  3 mL Intravenous Q12H   Continuous Infusions:  cefTRIAXone (ROCEPHIN)  IV 1 g (02/14/23 2030)   PRN Meds:.acetaminophen **OR** acetaminophen, albuterol, alum & mag hydroxide-simeth, bisacodyl, HYDROcodone-acetaminophen, hydrOXYzine, labetalol, ondansetron **OR** ondansetron (ZOFRAN) IV, senna-docusate, simethicone  Current Outpatient Medications  Medication Instructions   amLODipine (NORVASC) 5 mg, Oral, Daily   Benzalkonium Chloride (DIABETIC  BASICS HEALTHY FOOT EX) 1 application , Topical, Daily PRN, OTC diabetic foot cream    CRANBERRY PO 2 capsules, Oral, 2 times daily, 42mg    ESTRACE VAGINAL 0.1 MG/GM vaginal cream Apply locally every other night at bedtime   HYDROcodone-acetaminophen (NORCO/VICODIN) 5-325 MG tablet 1 tablet, Oral, Every 6 hours PRN   hydrOXYzine (ATARAX) 10 mg, Oral, Daily PRN   Multiple Vitamins-Minerals (EYE VITAMINS PO) Oral, AREDS2   sitaGLIPtin (JANUVIA) 50 mg, Oral, Daily   Vitamin D (Ergocalciferol) (DRISDOL) 50,000 Units, Oral, Weekly    Diet Orders (From admission, onward)     Start     Ordered   02/13/23 2048  Diet Heart Room service appropriate? Yes; Fluid consistency: Thin  Diet effective now       Question Answer Comment  Room service appropriate? Yes   Fluid consistency: Thin      02/13/23 2047            DVT prophylaxis: enoxaparin (LOVENOX) injection 40 mg Start: 02/13/23 2200   Lab Results  Component Value Date   PLT 272 02/15/2023      Code Status: DNR  Family Communication: no family at bedside   Status is: Inpatient  Level of care: Telemetry  Consultants:  none  Objective: Vitals:   02/14/23 1317 02/14/23 2050 02/14/23 2251 02/15/23 0454  BP: (!) 156/80 (!) 161/86 (!) 175/97 (!) 143/86  Pulse: 91 85 (!) 108 85  Resp: 18 18 (!) 33 18  Temp: 97.8 F (36.6 C) 97.6 F (36.4 C) 97.8 F (36.6 C) 97.6 F (36.4 C)  TempSrc: Oral  Oral Oral  SpO2: 98% 98%  99%  Weight:      Height:        Intake/Output Summary (Last 24 hours) at 02/15/2023 1207 Last data filed at 02/15/2023 1100 Gross per 24 hour  Intake 1680 ml  Output --  Net 1680 ml   Wt Readings from Last 3 Encounters:  02/13/23 52.2 kg  10/17/22 52.2 kg  07/07/22 53.5 kg    Examination:  Constitutional: NAD Eyes: lids and conjunctivae normal, no scleral icterus ENMT: mmm Neck: normal, supple Respiratory: clear to auscultation bilaterally, no wheezing, no crackles. Normal respiratory  effort.  Cardiovascular: Regular rate and rhythm, no murmurs / rubs / gallops. No LE edema. Abdomen: soft, no distention, no tenderness. Bowel sounds positive.   Data Reviewed: I have independently reviewed following labs and imaging studies   CBC Recent Labs  Lab 02/13/23 1834 02/14/23 0600 02/15/23 0116  WBC 8.2 7.6 7.8  HGB 15.0 13.9 14.0  HCT 44.3 42.1 42.3  PLT 267 255 272  MCV 90.0 91.5 90.2  MCH 30.5 30.2 29.9  MCHC 33.9 33.0 33.1  RDW 13.8 13.8 13.8  LYMPHSABS 2.3  --   --   MONOABS 0.6  --   --   EOSABS 0.6*  --   --   BASOSABS 0.1  --   --     Recent Labs  Lab 02/13/23 1834 02/14/23 0600 02/15/23 0116  NA 138 137 138  K 3.8 3.5 4.2  CL 106 106 106  CO2 24 22 24   GLUCOSE 107* 126* 127*  BUN 22 20 23   CREATININE 0.84 0.75 0.76  CALCIUM 8.6* 8.4* 8.9  AST 17  --  15  ALT 14  --  12  ALKPHOS 61  --  52  BILITOT 0.8  --  0.9  ALBUMIN 3.8  --  3.4*  MG  --  2.4 2.2  TSH 1.386  --   --     ------------------------------------------------------------------------------------------------------------------ No results for input(s): "CHOL", "HDL", "LDLCALC", "TRIG", "CHOLHDL", "LDLDIRECT" in the last 72 hours.  Lab Results  Component Value Date   HGBA1C 6.2 (H) 12/14/2021   ------------------------------------------------------------------------------------------------------------------ Recent Labs    02/13/23 1834  TSH 1.386    Cardiac Enzymes No results for input(s): "CKMB", "TROPONINI", "MYOGLOBIN" in the last 168 hours.  Invalid input(s): "CK" ------------------------------------------------------------------------------------------------------------------    Component Value Date/Time   BNP 104.3 (H) 11/17/2019 1549    CBG: No results for input(s): "GLUCAP" in the last 168 hours.  Recent Results (from the past 240 hour(s))  SARS Coronavirus 2 by RT PCR (hospital order, performed in [facility] hospital lab) *cepheid single result test*  Anterior Nasal Swab     Status: None   Collection Time: 02/13/23  5:44 PM   Specimen: Anterior Nasal Swab  Result Value Ref Range Status   SARS Coronavirus 2 by RT PCR NEGATIVE NEGATIVE Final    Comment: (NOTE) SARS-CoV-2 target nucleic acids are NOT DETECTED.  The SARS-CoV-2 RNA is generally detectable in upper and lower respiratory specimens during the acute phase of infection. The lowest concentration of SARS-CoV-2 viral copies this assay can detect is 250 copies / mL. A negative result does not preclude SARS-CoV-2 infection and should not be used as the sole basis for treatment or other patient management decisions.  A negative result may occur with improper specimen collection / handling, submission of specimen other than nasopharyngeal swab, presence of viral mutation(s) within the areas targeted by this assay, and inadequate number of viral copies (<250 copies / mL). A  negative result must be combined with clinical observations, patient history, and epidemiological information.  Fact Sheet for Patients:   RoadLapTop.co.za  Fact Sheet for Healthcare Providers: http://kim-miller.com/  This test is not yet approved or  cleared by the [place] FDA and has been authorized for detection and/or diagnosis of SARS-CoV-2 by FDA under an Emergency Use Authorization (EUA).  This EUA will remain in effect (meaning this test can be used) for the duration of the COVID-19 declaration under Section 564(b)(1) of the Act, 21 U.S.C. section 360bbb-3(b)(1), unless the authorization is terminated or revoked sooner.  Performed at [facility], [address] [place], [place] [address]   Urine Culture     Status: Abnormal (Preliminary result)   Collection Time: 02/13/23  5:44 PM   Specimen: Urine, Clean Catch  Result Value Ref Range Status   Specimen Description   Final    URINE, CLEAN CATCH Performed at [facility], [address], [place], [place] [address]    Special Requests   Final    NONE Performed at [facility], [address], [place], [place] [address]    Culture (A)  Final    >=100,000 COLONIES/mL ESCHERICHIA COLI SUSCEPTIBILITIES TO FOLLOW Performed at [facility], [address] [place], [place] [address]    Report Status PENDING  Incomplete     Radiology Studies: DG Chest Port 1 View  Result Date: 02/14/2023 CLINICAL DATA:  Abdominal distention, UTI. EXAM: PORTABLE CHEST 1 VIEW COMPARISON:  02/13/2023. FINDINGS: The heart size and mediastinal contours are within normal limits. There is atherosclerotic calcification of the aorta. No consolidation, effusion, or pneumothorax. Severe degenerative changes are noted at the left glenohumeral joint and moderate degenerative changes are present at the right glenohumeral joint. No acute osseous abnormality. IMPRESSION: No active disease. Electronically Signed   By: Thornell Sartorius M.D.   On: 02/14/2023 23:37   DG Abd 1 View  Result Date: 02/14/2023 CLINICAL DATA:  Abdominal pain and distension EXAM: ABDOMEN - 1 VIEW COMPARISON:  None Available. FINDINGS: The bowel gas pattern is normal. No radio-opaque calculi or other significant radiographic abnormality are seen. Right hip arthroplasty is present. There is curvature and degenerative change of the lumbar spine. IMPRESSION: Nonobstructive bowel gas pattern. Electronically Signed   By: Darliss Cheney M.D.   On: 02/14/2023 23:37     Pamella Pert, MD, PhD [facility]  Between 7 am - 7 pm I am available, please contact me via Amion (for emergencies) or Securechat (non urgent messages)  Between 7 pm - 7 am I am not available, please contact night coverage MD/APP via Amion

## 2023-02-16 DIAGNOSIS — N3001 Acute cystitis with hematuria: Secondary | ICD-10-CM | POA: Diagnosis not present

## 2023-02-16 LAB — COMPREHENSIVE METABOLIC PANEL
ALT: 14 U/L (ref 0–44)
AST: 18 U/L (ref 15–41)
Albumin: 3.5 g/dL (ref 3.5–5.0)
Alkaline Phosphatase: 54 U/L (ref 38–126)
Anion gap: 8 (ref 5–15)
BUN: 18 mg/dL (ref 8–23)
CO2: 24 mmol/L (ref 22–32)
Calcium: 9 mg/dL (ref 8.9–10.3)
Chloride: 109 mmol/L (ref 98–111)
Creatinine, Ser: 0.65 mg/dL (ref 0.44–1.00)
GFR, Estimated: >60 mL/min (ref >60)
Glucose, Bld: 122 mg/dL — ABNORMAL HIGH (ref 70–99)
Potassium: 4.2 mmol/L (ref 3.5–5.1)
Sodium: 141 mmol/L (ref 135–145)
Total Bilirubin: 1 mg/dL (ref 0.3–1.2)
Total Protein: 6.6 g/dL (ref 6.5–8.1)

## 2023-02-16 LAB — CBC
HCT: 44.8 % (ref 36.0–46.0)
Hemoglobin: 14.5 g/dL (ref 12.0–15.0)
MCH: 29.7 pg (ref 26.0–34.0)
MCHC: 32.4 g/dL (ref 30.0–36.0)
MCV: 91.6 fL (ref 80.0–100.0)
Platelets: 280 10*3/uL (ref 150–400)
RBC: 4.89 MIL/uL (ref 3.87–5.11)
RDW: 14 % (ref 11.5–15.5)
WBC: 7.6 10*3/uL (ref 4.0–10.5)
nRBC: 0 % (ref 0.0–0.2)

## 2023-02-16 LAB — URINE CULTURE: Culture: >=100000 — AB

## 2023-02-16 LAB — MAGNESIUM: Magnesium: 2.2 mg/dL (ref 1.7–2.4)

## 2023-02-16 MED ORDER — MELATONIN 3 MG PO TABS
3.0000 mg | ORAL_TABLET | Freq: Every evening | ORAL | Status: DC | PRN
  Administered 2023-02-16: 3 mg via ORAL
  Filled 2023-02-16: qty 1

## 2023-02-16 MED ORDER — CEFADROXIL 500 MG PO CAPS
500.0000 mg | ORAL_CAPSULE | Freq: Two times a day (BID) | ORAL | Status: DC
  Administered 2023-02-16 – 2023-02-17 (×3): 500 mg via ORAL
  Filled 2023-02-16 (×3): qty 1

## 2023-02-16 NOTE — Plan of Care (Signed)
  Problem: Coping: Goal: Level of anxiety will decrease Outcome: Progressing   Problem: Pain Managment: Goal: General experience of comfort will improve Outcome: Progressing   Problem: Safety: Goal: Ability to remain free from injury will improve Outcome: Progressing   

## 2023-02-16 NOTE — Progress Notes (Signed)
Mobility Specialist - Progress Note   02/16/23 1042  Mobility  Activity Ambulated with assistance in hallway  Level of Assistance Standby assist, set-up cues, supervision of patient - no hands on  Assistive Device Front wheel walker  Distance Ambulated (ft) 275 ft  Activity Response Tolerated well  Mobility Referral Yes  $Mobility charge 1 Mobility  Mobility Specialist Start Time (ACUTE ONLY) 1025  Mobility Specialist Stop Time (ACUTE ONLY) 1041  Mobility Specialist Time Calculation (min) (ACUTE ONLY) 16 min   Pt received in bed and agreeable to mobility. No complaints during session. Pt to bed after session with all needs met.   [facility]

## 2023-02-16 NOTE — Plan of Care (Signed)

## 2023-02-16 NOTE — Progress Notes (Signed)
PROGRESS NOTE  Alexa [name] MWN:[id number] DOB: 12-24-1929 DOA: 02/13/2023 PCP: Charlane Ferretti, DO   LOS: 2 days   Brief Narrative / Interim history: Alexa [name] is a 89 y.o. female with medical history significant for COPD, HTN, renal carcinoma s/p left nephrectomy, s/p right THA 11/2021 who presented to the ED for evaluation of dysuria and generalized weakness. She was found to have a UTI, cultures were sent and she was admitted to the hospital.   Subjective / 24h Interval events: Feeling better this morning, no longer having urinary retention  Assesement and Plan: Principal Problem:   Urinary tract infection Active Problems:   Generalized weakness   Essential hypertension   COPD (chronic obstructive pulmonary disease) (HCC)   UTI (urinary tract infection)   Principal problem UTI - with significant weakness still, SNF has been recommended and she is agreeable. Lives alone, not strong enough to go home yet.  Plan for SNF tomorrow -Urine culture with E. coli resistant to ampicillin and Unasyn, otherwise sensitive.  Narrowed to cefadroxil  Active problems COPD - stable, on room air, no wheezing  HTN - continue amlodipine  PACs - no A fib noted  Chronic pain - continue home norco  OA, hip replacement 11/2021 -noted  Scheduled Meds:  amLODipine  10 mg Oral Daily   enoxaparin (LOVENOX) injection  40 mg Subcutaneous Q24H   sodium chloride flush  3 mL Intravenous Q12H   Continuous Infusions:  cefTRIAXone (ROCEPHIN)  IV 1 g (02/15/23 2120)   PRN Meds:.acetaminophen **OR** acetaminophen, albuterol, alum & mag hydroxide-simeth, bisacodyl, HYDROcodone-acetaminophen, hydrOXYzine, labetalol, ondansetron **OR** ondansetron (ZOFRAN) IV, senna-docusate, simethicone  Current Outpatient Medications  Medication Instructions   amLODipine (NORVASC) 5 mg, Oral, Daily   Benzalkonium Chloride (DIABETIC BASICS HEALTHY FOOT EX) 1 application , Topical, Daily PRN, OTC diabetic foot cream     CRANBERRY PO 2 capsules, Oral, 2 times daily, 42mg    ESTRACE VAGINAL 0.1 MG/GM vaginal cream Apply locally every other night at bedtime   HYDROcodone-acetaminophen (NORCO/VICODIN) 5-325 MG tablet 1 tablet, Oral, Every 6 hours PRN   hydrOXYzine (ATARAX) 10 mg, Oral, Daily PRN   Multiple Vitamins-Minerals (EYE VITAMINS PO) Oral, AREDS2   sitaGLIPtin (JANUVIA) 50 mg, Oral, Daily   Vitamin D (Ergocalciferol) (DRISDOL) 50,000 Units, Oral, Weekly    Diet Orders (From admission, onward)     Start     Ordered   02/13/23 2048  Diet Heart Room service appropriate? Yes; Fluid consistency: Thin  Diet effective now       Question Answer Comment  Room service appropriate? Yes   Fluid consistency: Thin      02/13/23 2047            DVT prophylaxis: enoxaparin (LOVENOX) injection 40 mg Start: 02/13/23 2200   Lab Results  Component Value Date   PLT 280 02/16/2023      Code Status: DNR  Family Communication: no family at bedside   Status is: Inpatient.  SNF tomorrow  Level of care: Telemetry  Consultants:  none  Objective: Vitals:   02/15/23 1351 02/15/23 2025 02/16/23 0454 02/16/23 0915  BP: (!) 141/67 (!) 147/85 (!) 150/86 (!) 159/86  Pulse: (!) 107 90 78 70  Resp: 19 18 18    Temp: 97.6 F (36.4 C) 97.8 F (36.6 C) 97.6 F (36.4 C) 98 F (36.7 C)  TempSrc: Oral Oral Oral Oral  SpO2: 95% 96% 97% 100%  Weight:      Height:  Intake/Output Summary (Last 24 hours) at 02/16/2023 1150 Last data filed at 02/16/2023 1000 Gross per 24 hour  Intake 1741.51 ml  Output 650 ml  Net 1091.51 ml   Wt Readings from Last 3 Encounters:  02/13/23 52.2 kg  10/17/22 52.2 kg  07/07/22 53.5 kg    Examination:  Constitutional: NAD Eyes: lids and conjunctivae normal, no scleral icterus ENMT: mmm Neck: normal, supple Respiratory: clear to auscultation bilaterally, no wheezing, no crackles. Normal respiratory effort.  Cardiovascular: Regular rate and rhythm, no murmurs / rubs  / gallops. No LE edema. Abdomen: soft, no distention, no tenderness. Bowel sounds positive.    Data Reviewed: I have independently reviewed following labs and imaging studies   CBC Recent Labs  Lab 02/13/23 1834 02/14/23 0600 02/15/23 0116 02/16/23 0551  WBC 8.2 7.6 7.8 7.6  HGB 15.0 13.9 14.0 14.5  HCT 44.3 42.1 42.3 44.8  PLT 267 255 272 280  MCV 90.0 91.5 90.2 91.6  MCH 30.5 30.2 29.9 29.7  MCHC 33.9 33.0 33.1 32.4  RDW 13.8 13.8 13.8 14.0  LYMPHSABS 2.3  --   --   --   MONOABS 0.6  --   --   --   EOSABS 0.6*  --   --   --   BASOSABS 0.1  --   --   --     Recent Labs  Lab 02/13/23 1834 02/14/23 0600 02/15/23 0116 02/16/23 0551  NA 138 137 138 141  K 3.8 3.5 4.2 4.2  CL 106 106 106 109  CO2 24 22 24 24   GLUCOSE 107* 126* 127* 122*  BUN 22 20 23 18   CREATININE 0.84 0.75 0.76 0.65  CALCIUM 8.6* 8.4* 8.9 9.0  AST 17  --  15 18  ALT 14  --  12 14  ALKPHOS 61  --  52 54  BILITOT 0.8  --  0.9 1.0  ALBUMIN 3.8  --  3.4* 3.5  MG  --  2.4 2.2 2.2  TSH 1.386  --   --   --     ------------------------------------------------------------------------------------------------------------------ No results for input(s): "CHOL", "HDL", "LDLCALC", "TRIG", "CHOLHDL", "LDLDIRECT" in the last 72 hours.  Lab Results  Component Value Date   HGBA1C 6.2 (H) 12/14/2021   ------------------------------------------------------------------------------------------------------------------ Recent Labs    02/13/23 1834  TSH 1.386    Cardiac Enzymes No results for input(s): "CKMB", "TROPONINI", "MYOGLOBIN" in the last 168 hours.  Invalid input(s): "CK" ------------------------------------------------------------------------------------------------------------------    Component Value Date/Time   BNP 104.3 (H) 11/17/2019 1549    CBG: No results for input(s): "GLUCAP" in the last 168 hours.  Recent Results (from the past 240 hour(s))  SARS Coronavirus 2 by RT PCR (hospital  order, performed in [facility] hospital lab) *cepheid single result test* Anterior Nasal Swab     Status: None   Collection Time: 02/13/23  5:44 PM   Specimen: Anterior Nasal Swab  Result Value Ref Range Status   SARS Coronavirus 2 by RT PCR NEGATIVE NEGATIVE Final    Comment: (NOTE) SARS-CoV-2 target nucleic acids are NOT DETECTED.  The SARS-CoV-2 RNA is generally detectable in upper and lower respiratory specimens during the acute phase of infection. The lowest concentration of SARS-CoV-2 viral copies this assay can detect is 250 copies / mL. A negative result does not preclude SARS-CoV-2 infection and should not be used as the sole basis for treatment or other patient management decisions.  A negative result may occur with improper specimen collection / handling,  submission of specimen other than nasopharyngeal swab, presence of viral mutation(s) within the areas targeted by this assay, and inadequate number of viral copies (<250 copies / mL). A negative result must be combined with clinical observations, patient history, and epidemiological information.  Fact Sheet for Patients:   RoadLapTop.co.za  Fact Sheet for Healthcare Providers: http://kim-miller.com/  This test is not yet approved or  cleared by the [place] FDA and has been authorized for detection and/or diagnosis of SARS-CoV-2 by FDA under an Emergency Use Authorization (EUA).  This EUA will remain in effect (meaning this test can be used) for the duration of the COVID-19 declaration under Section 564(b)(1) of the Act, 21 U.S.C. section 360bbb-3(b)(1), unless the authorization is terminated or revoked sooner.  Performed at [facility], [address], [place], [place] [address]   Urine Culture     Status: Abnormal   Collection Time: 02/13/23  5:44 PM   Specimen: Urine, Clean Catch  Result Value Ref Range Status   Specimen Description   Final     URINE, CLEAN CATCH Performed at [facility], [address], [place], [place] [address]    Special Requests   Final    NONE Performed at [facility], [address] [place], [place] [address]    Culture >=100,000 COLONIES/mL ESCHERICHIA COLI (A)  Final   Report Status 02/16/2023 FINAL  Final   Organism ID, Bacteria ESCHERICHIA COLI (A)  Final      Susceptibility   Escherichia coli - MIC*    AMPICILLIN >=32 RESISTANT Resistant     CEFAZOLIN <=4 SENSITIVE Sensitive     CEFEPIME <=0.12 SENSITIVE Sensitive     CEFTRIAXONE <=0.25 SENSITIVE Sensitive     CIPROFLOXACIN <=0.25 SENSITIVE Sensitive     GENTAMICIN <=1 SENSITIVE Sensitive     IMIPENEM <=0.25 SENSITIVE Sensitive     NITROFURANTOIN <=16 SENSITIVE Sensitive     TRIMETH/SULFA <=20 SENSITIVE Sensitive     AMPICILLIN/SULBACTAM >=32 RESISTANT Resistant     PIP/TAZO <=4 SENSITIVE Sensitive     * >=100,000 COLONIES/mL ESCHERICHIA COLI     Radiology Studies: No results found.   Pamella Pert, MD, PhD [facility]  Between 7 am - 7 pm I am available, please contact me via Amion (for emergencies) or Securechat (non urgent messages)  Between 7 pm - 7 am I am not available, please contact night coverage MD/APP via Amion

## 2023-02-17 DIAGNOSIS — M79661 Pain in right lower leg: Secondary | ICD-10-CM | POA: Diagnosis not present

## 2023-02-17 DIAGNOSIS — Z86718 Personal history of other venous thrombosis and embolism: Secondary | ICD-10-CM | POA: Diagnosis not present

## 2023-02-17 DIAGNOSIS — E1165 Type 2 diabetes mellitus with hyperglycemia: Secondary | ICD-10-CM | POA: Diagnosis not present

## 2023-02-17 DIAGNOSIS — R2689 Other abnormalities of gait and mobility: Secondary | ICD-10-CM | POA: Diagnosis not present

## 2023-02-17 DIAGNOSIS — B9620 Unspecified Escherichia coli [E. coli] as the cause of diseases classified elsewhere: Secondary | ICD-10-CM | POA: Diagnosis not present

## 2023-02-17 DIAGNOSIS — R339 Retention of urine, unspecified: Secondary | ICD-10-CM | POA: Diagnosis not present

## 2023-02-17 DIAGNOSIS — Z87448 Personal history of other diseases of urinary system: Secondary | ICD-10-CM | POA: Diagnosis not present

## 2023-02-17 DIAGNOSIS — N390 Urinary tract infection, site not specified: Secondary | ICD-10-CM | POA: Diagnosis not present

## 2023-02-17 DIAGNOSIS — M1990 Unspecified osteoarthritis, unspecified site: Secondary | ICD-10-CM | POA: Diagnosis not present

## 2023-02-17 DIAGNOSIS — R2681 Unsteadiness on feet: Secondary | ICD-10-CM | POA: Diagnosis not present

## 2023-02-17 DIAGNOSIS — M6281 Muscle weakness (generalized): Secondary | ICD-10-CM | POA: Diagnosis not present

## 2023-02-17 DIAGNOSIS — I493 Ventricular premature depolarization: Secondary | ICD-10-CM | POA: Diagnosis not present

## 2023-02-17 DIAGNOSIS — M79604 Pain in right leg: Secondary | ICD-10-CM | POA: Diagnosis not present

## 2023-02-17 DIAGNOSIS — R41841 Cognitive communication deficit: Secondary | ICD-10-CM | POA: Diagnosis not present

## 2023-02-17 DIAGNOSIS — R1311 Dysphagia, oral phase: Secondary | ICD-10-CM | POA: Diagnosis not present

## 2023-02-17 DIAGNOSIS — Z7189 Other specified counseling: Secondary | ICD-10-CM | POA: Diagnosis not present

## 2023-02-17 DIAGNOSIS — Z9181 History of falling: Secondary | ICD-10-CM | POA: Diagnosis not present

## 2023-02-17 DIAGNOSIS — W19XXXA Unspecified fall, initial encounter: Secondary | ICD-10-CM | POA: Diagnosis not present

## 2023-02-17 DIAGNOSIS — N3001 Acute cystitis with hematuria: Secondary | ICD-10-CM | POA: Diagnosis not present

## 2023-02-17 DIAGNOSIS — K219 Gastro-esophageal reflux disease without esophagitis: Secondary | ICD-10-CM | POA: Diagnosis not present

## 2023-02-17 DIAGNOSIS — Z8619 Personal history of other infectious and parasitic diseases: Secondary | ICD-10-CM | POA: Diagnosis not present

## 2023-02-17 DIAGNOSIS — S8011XA Contusion of right lower leg, initial encounter: Secondary | ICD-10-CM | POA: Diagnosis not present

## 2023-02-17 DIAGNOSIS — M5416 Radiculopathy, lumbar region: Secondary | ICD-10-CM | POA: Diagnosis not present

## 2023-02-17 DIAGNOSIS — J449 Chronic obstructive pulmonary disease, unspecified: Secondary | ICD-10-CM | POA: Diagnosis not present

## 2023-02-17 DIAGNOSIS — I10 Essential (primary) hypertension: Secondary | ICD-10-CM | POA: Diagnosis not present

## 2023-02-17 DIAGNOSIS — Z87440 Personal history of urinary (tract) infections: Secondary | ICD-10-CM | POA: Diagnosis not present

## 2023-02-17 DIAGNOSIS — R531 Weakness: Secondary | ICD-10-CM | POA: Diagnosis not present

## 2023-02-17 DIAGNOSIS — S8011XD Contusion of right lower leg, subsequent encounter: Secondary | ICD-10-CM | POA: Diagnosis not present

## 2023-02-17 DIAGNOSIS — Z7984 Long term (current) use of oral hypoglycemic drugs: Secondary | ICD-10-CM | POA: Diagnosis not present

## 2023-02-17 DIAGNOSIS — E119 Type 2 diabetes mellitus without complications: Secondary | ICD-10-CM | POA: Diagnosis not present

## 2023-02-17 MED ORDER — HYDROCODONE-ACETAMINOPHEN 5-325 MG PO TABS: 1.0000 | ORAL_TABLET | Freq: Four times a day (QID) | ORAL | 0 refills | Status: DC | PRN

## 2023-02-17 MED ORDER — AMLODIPINE BESYLATE 5 MG PO TABS: 10.0000 mg | ORAL_TABLET | Freq: Every day | ORAL | Status: DC

## 2023-02-17 MED ORDER — CEFADROXIL 500 MG PO CAPS: 500.0000 mg | ORAL_CAPSULE | Freq: Two times a day (BID) | ORAL | Status: AC

## 2023-02-17 NOTE — Progress Notes (Signed)
Report called to [place] at [facility]. Tinnie Gens is aware that patients brother will be bringing her to rehab facility.

## 2023-02-17 NOTE — TOC Transition Note (Addendum)
Transition of Care [facility]) - CM/SW Discharge Note   Patient Details  Name: Alexa [name] MRN: [id number] Date of Birth: 27-Feb-1930  [facility]) CM/SW Contact:  Otelia Santee, LCSW Phone Number: 02/17/2023, 10:43 AM   Clinical Narrative:    Pt to transfer to [facility] for ST SNF. Pt will be going to room 604p. RN to call report to [phone]. Pt's brother to provide transportation to patient at discharge. Pt to call brother for time frame for transportation.    Final next level of care: Skilled Nursing Facility Barriers to Discharge: Barriers Resolved   Patient Goals and CMS Choice CMS Medicare.gov Compare Post Acute Care list provided to:: Patient Choice offered to / list presented to : Patient  Discharge Placement  Pt to transfer to [facility] for ST SNF. Pt will be going to room 604p. RN to call report to [phone]. Pt's brother to provide transportation to patient at discharge. Pt to call brother for time frame for transportation.    Existing PASRR number confirmed : 02/14/23          Patient chooses bed at: [facility] Patient to be transferred to facility by: Brother Name of family member notified: Patient Patient and family notified of of transfer: 02/17/23  Discharge Plan and Services Additional resources added to the After Visit Summary for   In-house Referral: NA Discharge Planning Services: NA Post Acute Care Choice: Skilled Nursing Facility          DME Arranged: N/A DME Agency: NA                  Social Determinants of Health (SDOH) Interventions SDOH Screenings   Food Insecurity: No Food Insecurity (10/22/2019)  Housing: Patient Declined (02/13/2023)  Transportation Needs: No Transportation Needs (10/22/2019)  Alcohol Screen: Low Risk  (04/03/2018)  Depression (PHQ2-9): Low Risk  (04/07/2020)  Financial Resource Strain: Low Risk  (08/08/2020)  Tobacco Use: Low Risk  (02/13/2023)     Readmission Risk Interventions     02/17/2023   10:42 AM 12/17/2021    1:08 PM  Readmission Risk Prevention Plan  Post Dischage Appt Complete   Medication Screening Complete   Transportation Screening Complete Complete  PCP or Specialist Appt within 5-7 Days  Complete  Home Care Screening  Complete  Medication Review (RN CM)  Complete

## 2023-02-17 NOTE — Plan of Care (Signed)

## 2023-02-17 NOTE — Discharge Summary (Signed)
Physician Discharge Summary  Alexa [name] ZOX:[id number] DOB: Jan 27, 1930 DOA: 02/13/2023  PCP: Alexa Ferretti, DO  Admit date: 02/13/2023 Discharge date: 02/17/2023  Admitted From: home Disposition:  SNF  Recommendations for Outpatient Follow-up:  Follow up with PCP in 1-2 weeks Continue cefadroxil for 3 additional days upon discharge  Home Health: none Equipment/Devices: none  Discharge Condition: stable CODE STATUS: DNR Diet Orders (From admission, onward)     Start     Ordered   02/13/23 2048  Diet Heart Room service appropriate? Yes; Fluid consistency: Thin  Diet effective now       Question Answer Comment  Room service appropriate? Yes   Fluid consistency: Thin      02/13/23 2047            HPI: Per admitting MD, Alexa [name] is a 89 y.o. female with medical history significant for COPD, HTN, renal carcinoma s/p left nephrectomy, s/p right THA 11/2021 who presented to the ED for evaluation of dysuria and generalized weakness. Patient states that she has been feeling poorly for about 1 week with low energy.  She has been having burning with urination over the last 24 hours.  Initially she had decreased urine output which is now more frequent.  She has been having suprapubic discomfort. She lives by herself.  She has been ambulating with a walker since her right hip replacement last year.  She says over the last couple days she has had increased difficulty getting up and ambulating in the house even when using her walker.  She has been feeling somewhat "foggy headed."  Hospital Course / Discharge diagnoses: Principal Problem:   Urinary tract infection Active Problems:   Generalized weakness   Essential hypertension   COPD (chronic obstructive pulmonary disease) (HCC)   UTI (urinary tract infection)   Principal problem UTI -patient was admitted to the hospital with UTI type symptoms as well as significant weakness.  She was initially placed on ceftriaxone, and  urine cultures showed E. coli resistant to ampicillin and Unasyn, otherwise sensitive.  She was narrowed to cefadroxil.  She continued to improve, feels close to baseline, and will be discharged to SNF in stable condition with 3 additional days of antibiotics.  She did have one episode of urinary retention requiring I&O cath, but following that has been voiding well on her own  Active problems COPD - stable, on room air, no wheezing HTN -blood pressure on the high side, increase amlodipine dose  PACs - no A fib noted Chronic pain - continue home norco OA, hip replacement 11/2021 -noted  Sepsis ruled out   Discharge Instructions   Allergies as of 02/17/2023       Reactions   Lisinopril    Cough D/Ced by Dr Sherene Sires   Codeine    nausea   Verapamil    Pain in feet        Medication List     TAKE these medications    amLODipine 5 MG tablet Commonly known as: NORVASC Take 2 tablets (10 mg total) by mouth daily. What changed: how much to take   cefadroxil 500 MG capsule Commonly known as: DURICEF Take 1 capsule (500 mg total) by mouth 2 (two) times daily for 3 days.   CRANBERRY PO Take 2 capsules by mouth 2 (two) times daily. 42mg    DIABETIC BASICS HEALTHY FOOT EX Apply 1 application topically daily as needed (foot pain). OTC diabetic foot cream   ESTRACE VAGINAL 0.1 MG/GM vaginal  cream Generic drug: estradiol Apply locally every other night at bedtime   EYE VITAMINS PO Take by mouth. AREDS2   HYDROcodone-acetaminophen 5-325 MG tablet Commonly known as: NORCO/VICODIN Take 1 tablet by mouth every 6 (six) hours as needed for moderate pain.   hydrOXYzine 10 MG tablet Commonly known as: ATARAX Take 10 mg by mouth daily as needed for itching.   sitaGLIPtin 50 MG tablet Commonly known as: JANUVIA Take 50 mg by mouth daily.   Vitamin D (Ergocalciferol) 1.25 MG (50000 UNIT) Caps capsule Commonly known as: DRISDOL Take 50,000 Units by mouth once a week.         Contact information for after-discharge care     Destination     [facility] Preferred SNF .   Service: Skilled Nursing Contact information: [address] [place] [address] [phone]                     Consultations: none  Procedures/Studies:  DG Chest Port 1 View  Result Date: 02/14/2023 CLINICAL DATA:  Abdominal distention, UTI. EXAM: PORTABLE CHEST 1 VIEW COMPARISON:  02/13/2023. FINDINGS: The heart size and mediastinal contours are within normal limits. There is atherosclerotic calcification of the aorta. No consolidation, effusion, or pneumothorax. Severe degenerative changes are noted at the left glenohumeral joint and moderate degenerative changes are present at the right glenohumeral joint. No acute osseous abnormality. IMPRESSION: No active disease. Electronically Signed   By: Thornell Sartorius M.D.   On: 02/14/2023 23:37   DG Abd 1 View  Result Date: 02/14/2023 CLINICAL DATA:  Abdominal pain and distension EXAM: ABDOMEN - 1 VIEW COMPARISON:  None Available. FINDINGS: The bowel gas pattern is normal. No radio-opaque calculi or other significant radiographic abnormality are seen. Right hip arthroplasty is present. There is curvature and degenerative change of the lumbar spine. IMPRESSION: Nonobstructive bowel gas pattern. Electronically Signed   By: Darliss Cheney M.D.   On: 02/14/2023 23:37   DG Chest Portable 1 View  Result Date: 02/13/2023 CLINICAL DATA:  Generalized weakness. Chest tightness and sputum since 1 week ago. Hypertension. EXAM: PORTABLE CHEST 1 VIEW COMPARISON:  12/14/2021 FINDINGS: Heart size and pulmonary vascularity are normal. Calcified and ectatic thoracic aorta similar to prior study. Emphysematous changes are suggested in the lungs. No airspace disease or consolidation. No pleural effusions. No pneumothorax. Degenerative changes in the spine and shoulders. IMPRESSION: 1. Emphysematous changes in the  lungs. No evidence of active pulmonary disease. 2. Calcified and ectatic thoracic aorta. Electronically Signed   By: Burman Nieves M.D.   On: 02/13/2023 18:14    Subjective: - no chest pain, shortness of breath, no abdominal pain, nausea or vomiting.   Discharge Exam: BP 135/80 (BP Location: Left Arm)   Pulse 64   Temp 97.7 F (36.5 C) (Oral)   Resp 18   Ht 5\' 1"  (1.549 m)   Wt 52.2 kg   LMP  (LMP Unknown)   SpO2 96%   BMI 21.74 kg/m   General: Pt is alert, awake, not in acute distress Cardiovascular: RRR, S1/S2 +, no rubs, no gallops Respiratory: CTA bilaterally, no wheezing, no rhonchi Abdominal: Soft, NT, ND, bowel sounds + Extremities: no edema, no cyanosis  The results of significant diagnostics from this hospitalization (including imaging, microbiology, ancillary and laboratory) are listed below for reference.     Microbiology: Recent Results (from the past 240 hour(s))  SARS Coronavirus 2 by RT PCR (hospital order, performed in [facility]  hospital lab) *cepheid single result test* Anterior Nasal Swab     Status: None   Collection Time: 02/13/23  5:44 PM   Specimen: Anterior Nasal Swab  Result Value Ref Range Status   SARS Coronavirus 2 by RT PCR NEGATIVE NEGATIVE Final    Comment: (NOTE) SARS-CoV-2 target nucleic acids are NOT DETECTED.  The SARS-CoV-2 RNA is generally detectable in upper and lower respiratory specimens during the acute phase of infection. The lowest concentration of SARS-CoV-2 viral copies this assay can detect is 250 copies / mL. A negative result does not preclude SARS-CoV-2 infection and should not be used as the sole basis for treatment or other patient management decisions.  A negative result may occur with improper specimen collection / handling, submission of specimen other than nasopharyngeal swab, presence of viral mutation(s) within the areas targeted by this assay, and inadequate number of viral copies (<250 copies / mL). A  negative result must be combined with clinical observations, patient history, and epidemiological information.  Fact Sheet for Patients:   RoadLapTop.co.za  Fact Sheet for Healthcare Providers: http://kim-miller.com/  This test is not yet approved or  cleared by the [place] FDA and has been authorized for detection and/or diagnosis of SARS-CoV-2 by FDA under an Emergency Use Authorization (EUA).  This EUA will remain in effect (meaning this test can be used) for the duration of the COVID-19 declaration under Section 564(b)(1) of the Act, 21 U.S.C. section 360bbb-3(b)(1), unless the authorization is terminated or revoked sooner.  Performed at [facility], [address], [place], [place] [address]   Urine Culture     Status: Abnormal   Collection Time: 02/13/23  5:44 PM   Specimen: Urine, Clean Catch  Result Value Ref Range Status   Specimen Description   Final    URINE, CLEAN CATCH Performed at [facility], [address] [place], [place] [address]    Special Requests   Final    NONE Performed at [facility], [address] [place], [place] [address]    Culture >=100,000 COLONIES/mL ESCHERICHIA COLI (A)  Final   Report Status 02/16/2023 FINAL  Final   Organism ID, Bacteria ESCHERICHIA COLI (A)  Final      Susceptibility   Escherichia coli - MIC*    AMPICILLIN >=32 RESISTANT Resistant     CEFAZOLIN <=4 SENSITIVE Sensitive     CEFEPIME <=0.12 SENSITIVE Sensitive     CEFTRIAXONE <=0.25 SENSITIVE Sensitive     CIPROFLOXACIN <=0.25 SENSITIVE Sensitive     GENTAMICIN <=1 SENSITIVE Sensitive     IMIPENEM <=0.25 SENSITIVE Sensitive     NITROFURANTOIN <=16 SENSITIVE Sensitive     TRIMETH/SULFA <=20 SENSITIVE Sensitive     AMPICILLIN/SULBACTAM >=32 RESISTANT Resistant     PIP/TAZO <=4 SENSITIVE Sensitive     * >=100,000 COLONIES/mL ESCHERICHIA COLI     Labs: Basic  Metabolic Panel: Recent Labs  Lab 02/13/23 1834 02/14/23 0600 02/15/23 0116 02/16/23 0551  NA 138 137 138 141  K 3.8 3.5 4.2 4.2  CL 106 106 106 109  CO2 24 22 24 24   GLUCOSE 107* 126* 127* 122*  BUN 22 20 23 18   CREATININE 0.84 0.75 0.76 0.65  CALCIUM 8.6* 8.4* 8.9 9.0  MG  --  2.4 2.2 2.2   Liver Function Tests: Recent Labs  Lab 02/13/23 1834 02/15/23 0116 02/16/23 0551  AST 17 15 18   ALT 14 12 14   ALKPHOS 61 52 54  BILITOT 0.8  0.9 1.0  PROT 7.1 6.3* 6.6  ALBUMIN 3.8 3.4* 3.5   CBC: Recent Labs  Lab 02/13/23 1834 02/14/23 0600 02/15/23 0116 02/16/23 0551  WBC 8.2 7.6 7.8 7.6  NEUTROABS 4.7  --   --   --   HGB 15.0 13.9 14.0 14.5  HCT 44.3 42.1 42.3 44.8  MCV 90.0 91.5 90.2 91.6  PLT 267 255 272 280   CBG: No results for input(s): "GLUCAP" in the last 168 hours. Hgb A1c No results for input(s): "HGBA1C" in the last 72 hours. Lipid Profile No results for input(s): "CHOL", "HDL", "LDLCALC", "TRIG", "CHOLHDL", "LDLDIRECT" in the last 72 hours. Thyroid function studies No results for input(s): "TSH", "T4TOTAL", "T3FREE", "THYROIDAB" in the last 72 hours.  Invalid input(s): "FREET3" Urinalysis    Component Value Date/Time   COLORURINE STRAW (A) 02/13/2023 1744   APPEARANCEUR CLOUDY (A) 02/13/2023 1744   LABSPEC 1.003 (L) 02/13/2023 1744   PHURINE 7.0 02/13/2023 1744   GLUCOSEU NEGATIVE 02/13/2023 1744   HGBUR SMALL (A) 02/13/2023 1744   HGBUR negative 08/02/2009 1512   BILIRUBINUR NEGATIVE 02/13/2023 1744   BILIRUBINUR negative 08/25/2019 1154   BILIRUBINUR Negative 12/16/2017 1115   KETONESUR NEGATIVE 02/13/2023 1744   PROTEINUR NEGATIVE 02/13/2023 1744   UROBILINOGEN 0.2 08/25/2019 1154   UROBILINOGEN 0.2 12/12/2014 1623   NITRITE NEGATIVE 02/13/2023 1744   LEUKOCYTESUR LARGE (A) 02/13/2023 1744    FURTHER DISCHARGE INSTRUCTIONS:   Get Medicines reviewed and adjusted: Please take all your medications with you for your next visit with your  Primary MD   Laboratory/radiological data: Please request your Primary MD to go over all hospital tests and procedure/radiological results at the follow up, please ask your Primary MD to get all Hospital records sent to his/her office.   In some cases, they will be blood work, cultures and biopsy results pending at the time of your discharge. Please request that your primary care M.D. goes through all the records of your hospital data and follows up on these results.   Also Note the following: If you experience worsening of your admission symptoms, develop shortness of breath, life threatening emergency, suicidal or homicidal thoughts you must seek medical attention immediately by calling 911 or calling your MD immediately  if symptoms less severe.   You must read complete instructions/literature along with all the possible adverse reactions/side effects for all the Medicines you take and that have been prescribed to you. Take any new Medicines after you have completely understood and accpet all the possible adverse reactions/side effects.    Do not drive when taking Pain medications or sleeping medications (Benzodaizepines)   Do not take more than prescribed Pain, Sleep and Anxiety Medications. It is not advisable to combine anxiety,sleep and pain medications without talking with your primary care practitioner   Special Instructions: If you have smoked or chewed Tobacco  in the last 2 yrs please stop smoking, stop any regular Alcohol  and or any Recreational drug use.   Wear Seat belts while driving.   Please note: You were cared for by a hospitalist during your hospital stay. Once you are discharged, your primary care physician will handle any further medical issues. Please note that NO REFILLS for any discharge medications will be authorized once you are discharged, as it is imperative that you return to your primary care physician (or establish a relationship with a primary care physician if  you do not have one) for your post hospital discharge needs so that they  can reassess your need for medications and monitor your lab values.  Time coordinating discharge: 35 minutes  SIGNED:  Pamella Pert, MD, PhD 02/17/2023, 9:00 AM

## 2023-02-18 DIAGNOSIS — B9620 Unspecified Escherichia coli [E. coli] as the cause of diseases classified elsewhere: Secondary | ICD-10-CM | POA: Diagnosis not present

## 2023-02-18 DIAGNOSIS — N390 Urinary tract infection, site not specified: Secondary | ICD-10-CM | POA: Diagnosis not present

## 2023-02-18 DIAGNOSIS — I10 Essential (primary) hypertension: Secondary | ICD-10-CM | POA: Diagnosis not present

## 2023-02-18 DIAGNOSIS — E1165 Type 2 diabetes mellitus with hyperglycemia: Secondary | ICD-10-CM | POA: Diagnosis not present

## 2023-02-18 DIAGNOSIS — R531 Weakness: Secondary | ICD-10-CM | POA: Diagnosis not present

## 2023-02-18 DIAGNOSIS — J449 Chronic obstructive pulmonary disease, unspecified: Secondary | ICD-10-CM | POA: Diagnosis not present

## 2023-02-20 DIAGNOSIS — Z86718 Personal history of other venous thrombosis and embolism: Secondary | ICD-10-CM | POA: Diagnosis not present

## 2023-02-20 DIAGNOSIS — R339 Retention of urine, unspecified: Secondary | ICD-10-CM | POA: Diagnosis not present

## 2023-02-20 DIAGNOSIS — E1165 Type 2 diabetes mellitus with hyperglycemia: Secondary | ICD-10-CM | POA: Diagnosis not present

## 2023-02-20 DIAGNOSIS — I493 Ventricular premature depolarization: Secondary | ICD-10-CM | POA: Diagnosis not present

## 2023-02-20 DIAGNOSIS — R2681 Unsteadiness on feet: Secondary | ICD-10-CM | POA: Diagnosis not present

## 2023-02-20 DIAGNOSIS — Z9181 History of falling: Secondary | ICD-10-CM | POA: Diagnosis not present

## 2023-02-20 DIAGNOSIS — M1990 Unspecified osteoarthritis, unspecified site: Secondary | ICD-10-CM | POA: Diagnosis not present

## 2023-02-20 DIAGNOSIS — R531 Weakness: Secondary | ICD-10-CM | POA: Diagnosis not present

## 2023-02-20 DIAGNOSIS — K219 Gastro-esophageal reflux disease without esophagitis: Secondary | ICD-10-CM | POA: Diagnosis not present

## 2023-02-20 DIAGNOSIS — I10 Essential (primary) hypertension: Secondary | ICD-10-CM | POA: Diagnosis not present

## 2023-02-20 DIAGNOSIS — J449 Chronic obstructive pulmonary disease, unspecified: Secondary | ICD-10-CM | POA: Diagnosis not present

## 2023-02-20 DIAGNOSIS — M5416 Radiculopathy, lumbar region: Secondary | ICD-10-CM | POA: Diagnosis not present

## 2023-02-24 DIAGNOSIS — R531 Weakness: Secondary | ICD-10-CM | POA: Diagnosis not present

## 2023-02-24 DIAGNOSIS — R2681 Unsteadiness on feet: Secondary | ICD-10-CM | POA: Diagnosis not present

## 2023-02-24 DIAGNOSIS — Z9181 History of falling: Secondary | ICD-10-CM | POA: Diagnosis not present

## 2023-02-24 DIAGNOSIS — M5416 Radiculopathy, lumbar region: Secondary | ICD-10-CM | POA: Diagnosis not present

## 2023-02-26 DIAGNOSIS — Z9181 History of falling: Secondary | ICD-10-CM | POA: Diagnosis not present

## 2023-02-26 DIAGNOSIS — R2681 Unsteadiness on feet: Secondary | ICD-10-CM | POA: Diagnosis not present

## 2023-02-26 DIAGNOSIS — M5416 Radiculopathy, lumbar region: Secondary | ICD-10-CM | POA: Diagnosis not present

## 2023-02-26 DIAGNOSIS — R531 Weakness: Secondary | ICD-10-CM | POA: Diagnosis not present

## 2023-02-27 DIAGNOSIS — Z7984 Long term (current) use of oral hypoglycemic drugs: Secondary | ICD-10-CM | POA: Diagnosis not present

## 2023-02-27 DIAGNOSIS — Z7189 Other specified counseling: Secondary | ICD-10-CM | POA: Diagnosis not present

## 2023-02-27 DIAGNOSIS — Z87448 Personal history of other diseases of urinary system: Secondary | ICD-10-CM | POA: Diagnosis not present

## 2023-02-27 DIAGNOSIS — E1165 Type 2 diabetes mellitus with hyperglycemia: Secondary | ICD-10-CM | POA: Diagnosis not present

## 2023-02-27 DIAGNOSIS — W19XXXA Unspecified fall, initial encounter: Secondary | ICD-10-CM | POA: Diagnosis not present

## 2023-02-27 DIAGNOSIS — M5416 Radiculopathy, lumbar region: Secondary | ICD-10-CM | POA: Diagnosis not present

## 2023-02-27 DIAGNOSIS — J449 Chronic obstructive pulmonary disease, unspecified: Secondary | ICD-10-CM | POA: Diagnosis not present

## 2023-02-27 DIAGNOSIS — M79604 Pain in right leg: Secondary | ICD-10-CM | POA: Diagnosis not present

## 2023-02-27 DIAGNOSIS — S8011XA Contusion of right lower leg, initial encounter: Secondary | ICD-10-CM | POA: Diagnosis not present

## 2023-03-03 DIAGNOSIS — S8011XD Contusion of right lower leg, subsequent encounter: Secondary | ICD-10-CM | POA: Diagnosis not present

## 2023-03-03 DIAGNOSIS — K219 Gastro-esophageal reflux disease without esophagitis: Secondary | ICD-10-CM | POA: Diagnosis not present

## 2023-03-03 DIAGNOSIS — J449 Chronic obstructive pulmonary disease, unspecified: Secondary | ICD-10-CM | POA: Diagnosis not present

## 2023-03-03 DIAGNOSIS — Z7984 Long term (current) use of oral hypoglycemic drugs: Secondary | ICD-10-CM | POA: Diagnosis not present

## 2023-03-03 DIAGNOSIS — Z87440 Personal history of urinary (tract) infections: Secondary | ICD-10-CM | POA: Diagnosis not present

## 2023-03-03 DIAGNOSIS — M5416 Radiculopathy, lumbar region: Secondary | ICD-10-CM | POA: Diagnosis not present

## 2023-03-03 DIAGNOSIS — E1165 Type 2 diabetes mellitus with hyperglycemia: Secondary | ICD-10-CM | POA: Diagnosis not present

## 2023-03-04 DIAGNOSIS — I493 Ventricular premature depolarization: Secondary | ICD-10-CM | POA: Diagnosis not present

## 2023-03-04 DIAGNOSIS — B9620 Unspecified Escherichia coli [E. coli] as the cause of diseases classified elsewhere: Secondary | ICD-10-CM | POA: Diagnosis not present

## 2023-03-04 DIAGNOSIS — E1165 Type 2 diabetes mellitus with hyperglycemia: Secondary | ICD-10-CM | POA: Diagnosis not present

## 2023-03-04 DIAGNOSIS — J4489 Other specified chronic obstructive pulmonary disease: Secondary | ICD-10-CM | POA: Diagnosis not present

## 2023-03-04 DIAGNOSIS — I10 Essential (primary) hypertension: Secondary | ICD-10-CM | POA: Diagnosis not present

## 2023-03-04 DIAGNOSIS — R339 Retention of urine, unspecified: Secondary | ICD-10-CM | POA: Diagnosis not present

## 2023-03-04 DIAGNOSIS — K219 Gastro-esophageal reflux disease without esophagitis: Secondary | ICD-10-CM | POA: Diagnosis not present

## 2023-03-04 DIAGNOSIS — N390 Urinary tract infection, site not specified: Secondary | ICD-10-CM | POA: Diagnosis not present

## 2023-03-04 DIAGNOSIS — K5790 Diverticulosis of intestine, part unspecified, without perforation or abscess without bleeding: Secondary | ICD-10-CM | POA: Diagnosis not present

## 2023-03-10 DIAGNOSIS — B9620 Unspecified Escherichia coli [E. coli] as the cause of diseases classified elsewhere: Secondary | ICD-10-CM | POA: Diagnosis not present

## 2023-03-10 DIAGNOSIS — N390 Urinary tract infection, site not specified: Secondary | ICD-10-CM | POA: Diagnosis not present

## 2023-03-10 DIAGNOSIS — E1165 Type 2 diabetes mellitus with hyperglycemia: Secondary | ICD-10-CM | POA: Diagnosis not present

## 2023-03-10 DIAGNOSIS — I493 Ventricular premature depolarization: Secondary | ICD-10-CM | POA: Diagnosis not present

## 2023-03-10 DIAGNOSIS — K219 Gastro-esophageal reflux disease without esophagitis: Secondary | ICD-10-CM | POA: Diagnosis not present

## 2023-03-10 DIAGNOSIS — R339 Retention of urine, unspecified: Secondary | ICD-10-CM | POA: Diagnosis not present

## 2023-03-10 DIAGNOSIS — I10 Essential (primary) hypertension: Secondary | ICD-10-CM | POA: Diagnosis not present

## 2023-03-10 DIAGNOSIS — K5790 Diverticulosis of intestine, part unspecified, without perforation or abscess without bleeding: Secondary | ICD-10-CM | POA: Diagnosis not present

## 2023-03-10 DIAGNOSIS — J4489 Other specified chronic obstructive pulmonary disease: Secondary | ICD-10-CM | POA: Diagnosis not present

## 2023-03-12 DIAGNOSIS — I10 Essential (primary) hypertension: Secondary | ICD-10-CM | POA: Diagnosis not present

## 2023-03-12 DIAGNOSIS — N390 Urinary tract infection, site not specified: Secondary | ICD-10-CM | POA: Diagnosis not present

## 2023-03-12 DIAGNOSIS — E1169 Type 2 diabetes mellitus with other specified complication: Secondary | ICD-10-CM | POA: Diagnosis not present

## 2023-03-12 DIAGNOSIS — R399 Unspecified symptoms and signs involving the genitourinary system: Secondary | ICD-10-CM | POA: Diagnosis not present

## 2023-03-12 DIAGNOSIS — R2681 Unsteadiness on feet: Secondary | ICD-10-CM | POA: Diagnosis not present

## 2023-03-12 DIAGNOSIS — M810 Age-related osteoporosis without current pathological fracture: Secondary | ICD-10-CM | POA: Diagnosis not present

## 2023-03-12 DIAGNOSIS — F0390 Unspecified dementia without behavioral disturbance: Secondary | ICD-10-CM | POA: Diagnosis not present

## 2023-03-12 DIAGNOSIS — M549 Dorsalgia, unspecified: Secondary | ICD-10-CM | POA: Diagnosis not present

## 2023-03-12 DIAGNOSIS — D6859 Other primary thrombophilia: Secondary | ICD-10-CM | POA: Diagnosis not present

## 2023-03-19 DIAGNOSIS — I493 Ventricular premature depolarization: Secondary | ICD-10-CM | POA: Diagnosis not present

## 2023-03-19 DIAGNOSIS — I10 Essential (primary) hypertension: Secondary | ICD-10-CM | POA: Diagnosis not present

## 2023-03-19 DIAGNOSIS — R339 Retention of urine, unspecified: Secondary | ICD-10-CM | POA: Diagnosis not present

## 2023-03-19 DIAGNOSIS — K219 Gastro-esophageal reflux disease without esophagitis: Secondary | ICD-10-CM | POA: Diagnosis not present

## 2023-03-19 DIAGNOSIS — B9620 Unspecified Escherichia coli [E. coli] as the cause of diseases classified elsewhere: Secondary | ICD-10-CM | POA: Diagnosis not present

## 2023-03-19 DIAGNOSIS — E1165 Type 2 diabetes mellitus with hyperglycemia: Secondary | ICD-10-CM | POA: Diagnosis not present

## 2023-03-19 DIAGNOSIS — K5790 Diverticulosis of intestine, part unspecified, without perforation or abscess without bleeding: Secondary | ICD-10-CM | POA: Diagnosis not present

## 2023-03-19 DIAGNOSIS — J4489 Other specified chronic obstructive pulmonary disease: Secondary | ICD-10-CM | POA: Diagnosis not present

## 2023-03-19 DIAGNOSIS — N390 Urinary tract infection, site not specified: Secondary | ICD-10-CM | POA: Diagnosis not present

## 2023-03-21 DIAGNOSIS — K219 Gastro-esophageal reflux disease without esophagitis: Secondary | ICD-10-CM | POA: Diagnosis not present

## 2023-03-21 DIAGNOSIS — B9620 Unspecified Escherichia coli [E. coli] as the cause of diseases classified elsewhere: Secondary | ICD-10-CM | POA: Diagnosis not present

## 2023-03-21 DIAGNOSIS — I493 Ventricular premature depolarization: Secondary | ICD-10-CM | POA: Diagnosis not present

## 2023-03-21 DIAGNOSIS — J4489 Other specified chronic obstructive pulmonary disease: Secondary | ICD-10-CM | POA: Diagnosis not present

## 2023-03-21 DIAGNOSIS — K5790 Diverticulosis of intestine, part unspecified, without perforation or abscess without bleeding: Secondary | ICD-10-CM | POA: Diagnosis not present

## 2023-03-21 DIAGNOSIS — E1165 Type 2 diabetes mellitus with hyperglycemia: Secondary | ICD-10-CM | POA: Diagnosis not present

## 2023-03-21 DIAGNOSIS — N390 Urinary tract infection, site not specified: Secondary | ICD-10-CM | POA: Diagnosis not present

## 2023-03-21 DIAGNOSIS — R339 Retention of urine, unspecified: Secondary | ICD-10-CM | POA: Diagnosis not present

## 2023-03-21 DIAGNOSIS — I10 Essential (primary) hypertension: Secondary | ICD-10-CM | POA: Diagnosis not present

## 2023-03-25 DIAGNOSIS — B9620 Unspecified Escherichia coli [E. coli] as the cause of diseases classified elsewhere: Secondary | ICD-10-CM | POA: Diagnosis not present

## 2023-03-25 DIAGNOSIS — R339 Retention of urine, unspecified: Secondary | ICD-10-CM | POA: Diagnosis not present

## 2023-03-25 DIAGNOSIS — N390 Urinary tract infection, site not specified: Secondary | ICD-10-CM | POA: Diagnosis not present

## 2023-03-25 DIAGNOSIS — K5790 Diverticulosis of intestine, part unspecified, without perforation or abscess without bleeding: Secondary | ICD-10-CM | POA: Diagnosis not present

## 2023-03-25 DIAGNOSIS — I10 Essential (primary) hypertension: Secondary | ICD-10-CM | POA: Diagnosis not present

## 2023-03-25 DIAGNOSIS — I493 Ventricular premature depolarization: Secondary | ICD-10-CM | POA: Diagnosis not present

## 2023-03-25 DIAGNOSIS — J4489 Other specified chronic obstructive pulmonary disease: Secondary | ICD-10-CM | POA: Diagnosis not present

## 2023-03-25 DIAGNOSIS — K219 Gastro-esophageal reflux disease without esophagitis: Secondary | ICD-10-CM | POA: Diagnosis not present

## 2023-03-25 DIAGNOSIS — E1165 Type 2 diabetes mellitus with hyperglycemia: Secondary | ICD-10-CM | POA: Diagnosis not present

## 2023-04-22 DIAGNOSIS — E785 Hyperlipidemia, unspecified: Secondary | ICD-10-CM | POA: Diagnosis not present

## 2023-04-22 DIAGNOSIS — E559 Vitamin D deficiency, unspecified: Secondary | ICD-10-CM | POA: Diagnosis not present

## 2023-04-22 DIAGNOSIS — N183 Chronic kidney disease, stage 3 unspecified: Secondary | ICD-10-CM | POA: Diagnosis not present

## 2023-04-22 DIAGNOSIS — N1831 Chronic kidney disease, stage 3a: Secondary | ICD-10-CM | POA: Diagnosis not present

## 2023-04-22 DIAGNOSIS — M810 Age-related osteoporosis without current pathological fracture: Secondary | ICD-10-CM | POA: Diagnosis not present

## 2023-04-22 DIAGNOSIS — E1169 Type 2 diabetes mellitus with other specified complication: Secondary | ICD-10-CM | POA: Diagnosis not present

## 2023-04-22 DIAGNOSIS — Z1389 Encounter for screening for other disorder: Secondary | ICD-10-CM | POA: Diagnosis not present

## 2023-04-22 DIAGNOSIS — I10 Essential (primary) hypertension: Secondary | ICD-10-CM | POA: Diagnosis not present

## 2023-05-07 DIAGNOSIS — Z0000 Encounter for general adult medical examination without abnormal findings: Secondary | ICD-10-CM | POA: Diagnosis not present

## 2023-05-07 DIAGNOSIS — E441 Mild protein-calorie malnutrition: Secondary | ICD-10-CM | POA: Diagnosis not present

## 2023-05-07 DIAGNOSIS — M549 Dorsalgia, unspecified: Secondary | ICD-10-CM | POA: Diagnosis not present

## 2023-05-07 DIAGNOSIS — E1122 Type 2 diabetes mellitus with diabetic chronic kidney disease: Secondary | ICD-10-CM | POA: Diagnosis not present

## 2023-05-07 DIAGNOSIS — N1831 Chronic kidney disease, stage 3a: Secondary | ICD-10-CM | POA: Diagnosis not present

## 2023-05-07 DIAGNOSIS — I10 Essential (primary) hypertension: Secondary | ICD-10-CM | POA: Diagnosis not present

## 2023-05-07 DIAGNOSIS — Z23 Encounter for immunization: Secondary | ICD-10-CM | POA: Diagnosis not present

## 2023-05-07 DIAGNOSIS — M810 Age-related osteoporosis without current pathological fracture: Secondary | ICD-10-CM | POA: Diagnosis not present

## 2023-05-07 DIAGNOSIS — R059 Cough, unspecified: Secondary | ICD-10-CM | POA: Diagnosis not present

## 2023-05-07 DIAGNOSIS — D6859 Other primary thrombophilia: Secondary | ICD-10-CM | POA: Diagnosis not present

## 2023-05-21 ENCOUNTER — Emergency Department (HOSPITAL_COMMUNITY): Payer: Medicare Other

## 2023-05-21 ENCOUNTER — Inpatient Hospital Stay (HOSPITAL_COMMUNITY)
Admission: EM | Admit: 2023-05-21 | Discharge: 2023-05-27 | DRG: 690 | Disposition: A | Discharge status: Skilled Nursing Facility | Payer: Medicare Other | Attending: Internal Medicine | Admitting: Internal Medicine

## 2023-05-21 DIAGNOSIS — Z825 Family history of asthma and other chronic lower respiratory diseases: Secondary | ICD-10-CM | POA: Diagnosis not present

## 2023-05-21 DIAGNOSIS — K5730 Diverticulosis of large intestine without perforation or abscess without bleeding: Secondary | ICD-10-CM | POA: Diagnosis present

## 2023-05-21 DIAGNOSIS — R110 Nausea: Secondary | ICD-10-CM | POA: Diagnosis not present

## 2023-05-21 DIAGNOSIS — Z66 Do not resuscitate: Secondary | ICD-10-CM | POA: Diagnosis present

## 2023-05-21 DIAGNOSIS — N3000 Acute cystitis without hematuria: Principal | ICD-10-CM | POA: Diagnosis present

## 2023-05-21 DIAGNOSIS — Z818 Family history of other mental and behavioral disorders: Secondary | ICD-10-CM | POA: Diagnosis not present

## 2023-05-21 DIAGNOSIS — I440 Atrioventricular block, first degree: Secondary | ICD-10-CM | POA: Diagnosis present

## 2023-05-21 DIAGNOSIS — Z860100 Personal history of colon polyps, unspecified: Secondary | ICD-10-CM

## 2023-05-21 DIAGNOSIS — R112 Nausea with vomiting, unspecified: Principal | ICD-10-CM

## 2023-05-21 DIAGNOSIS — Z823 Family history of stroke: Secondary | ICD-10-CM | POA: Diagnosis not present

## 2023-05-21 DIAGNOSIS — Z85528 Personal history of other malignant neoplasm of kidney: Secondary | ICD-10-CM

## 2023-05-21 DIAGNOSIS — Z820 Family history of epilepsy and other diseases of the nervous system: Secondary | ICD-10-CM

## 2023-05-21 DIAGNOSIS — Z905 Acquired absence of kidney: Secondary | ICD-10-CM

## 2023-05-21 DIAGNOSIS — Z833 Family history of diabetes mellitus: Secondary | ICD-10-CM

## 2023-05-21 DIAGNOSIS — N1831 Chronic kidney disease, stage 3a: Secondary | ICD-10-CM | POA: Diagnosis not present

## 2023-05-21 DIAGNOSIS — Z1611 Resistance to penicillins: Secondary | ICD-10-CM | POA: Diagnosis present

## 2023-05-21 DIAGNOSIS — E785 Hyperlipidemia, unspecified: Secondary | ICD-10-CM | POA: Diagnosis present

## 2023-05-21 DIAGNOSIS — Z96641 Presence of right artificial hip joint: Secondary | ICD-10-CM | POA: Diagnosis present

## 2023-05-21 DIAGNOSIS — R778 Other specified abnormalities of plasma proteins: Secondary | ICD-10-CM | POA: Diagnosis not present

## 2023-05-21 DIAGNOSIS — R42 Dizziness and giddiness: Secondary | ICD-10-CM | POA: Diagnosis not present

## 2023-05-21 DIAGNOSIS — G8929 Other chronic pain: Secondary | ICD-10-CM | POA: Diagnosis present

## 2023-05-21 DIAGNOSIS — Z8249 Family history of ischemic heart disease and other diseases of the circulatory system: Secondary | ICD-10-CM

## 2023-05-21 DIAGNOSIS — R109 Unspecified abdominal pain: Secondary | ICD-10-CM | POA: Diagnosis not present

## 2023-05-21 DIAGNOSIS — Z885 Allergy status to narcotic agent status: Secondary | ICD-10-CM

## 2023-05-21 DIAGNOSIS — E1122 Type 2 diabetes mellitus with diabetic chronic kidney disease: Secondary | ICD-10-CM | POA: Diagnosis present

## 2023-05-21 DIAGNOSIS — B9620 Unspecified Escherichia coli [E. coli] as the cause of diseases classified elsewhere: Secondary | ICD-10-CM | POA: Diagnosis not present

## 2023-05-21 DIAGNOSIS — K5903 Drug induced constipation: Secondary | ICD-10-CM | POA: Diagnosis present

## 2023-05-21 DIAGNOSIS — R7989 Other specified abnormal findings of blood chemistry: Secondary | ICD-10-CM | POA: Diagnosis not present

## 2023-05-21 DIAGNOSIS — R55 Syncope and collapse: Secondary | ICD-10-CM | POA: Insufficient documentation

## 2023-05-21 DIAGNOSIS — Z801 Family history of malignant neoplasm of trachea, bronchus and lung: Secondary | ICD-10-CM | POA: Diagnosis not present

## 2023-05-21 DIAGNOSIS — I10 Essential (primary) hypertension: Secondary | ICD-10-CM | POA: Diagnosis present

## 2023-05-21 DIAGNOSIS — I2489 Other forms of acute ischemic heart disease: Secondary | ICD-10-CM | POA: Diagnosis not present

## 2023-05-21 DIAGNOSIS — Z888 Allergy status to other drugs, medicaments and biological substances status: Secondary | ICD-10-CM

## 2023-05-21 DIAGNOSIS — Z7984 Long term (current) use of oral hypoglycemic drugs: Secondary | ICD-10-CM

## 2023-05-21 DIAGNOSIS — I129 Hypertensive chronic kidney disease with stage 1 through stage 4 chronic kidney disease, or unspecified chronic kidney disease: Secondary | ICD-10-CM | POA: Diagnosis present

## 2023-05-21 DIAGNOSIS — R1111 Vomiting without nausea: Secondary | ICD-10-CM | POA: Diagnosis not present

## 2023-05-21 DIAGNOSIS — Z86718 Personal history of other venous thrombosis and embolism: Secondary | ICD-10-CM | POA: Diagnosis not present

## 2023-05-21 DIAGNOSIS — Z87440 Personal history of urinary (tract) infections: Secondary | ICD-10-CM

## 2023-05-21 DIAGNOSIS — K5790 Diverticulosis of intestine, part unspecified, without perforation or abscess without bleeding: Secondary | ICD-10-CM | POA: Diagnosis not present

## 2023-05-21 DIAGNOSIS — R1310 Dysphagia, unspecified: Secondary | ICD-10-CM | POA: Diagnosis not present

## 2023-05-21 DIAGNOSIS — Z1152 Encounter for screening for COVID-19: Secondary | ICD-10-CM | POA: Diagnosis not present

## 2023-05-21 DIAGNOSIS — I5A Non-ischemic myocardial injury (non-traumatic): Secondary | ICD-10-CM | POA: Diagnosis present

## 2023-05-21 DIAGNOSIS — Z79899 Other long term (current) drug therapy: Secondary | ICD-10-CM

## 2023-05-21 DIAGNOSIS — K5900 Constipation, unspecified: Secondary | ICD-10-CM | POA: Insufficient documentation

## 2023-05-21 DIAGNOSIS — N390 Urinary tract infection, site not specified: Secondary | ICD-10-CM | POA: Diagnosis present

## 2023-05-21 DIAGNOSIS — Z751 Person awaiting admission to adequate facility elsewhere: Secondary | ICD-10-CM

## 2023-05-21 DIAGNOSIS — J4489 Other specified chronic obstructive pulmonary disease: Secondary | ICD-10-CM | POA: Diagnosis present

## 2023-05-21 DIAGNOSIS — T402X5A Adverse effect of other opioids, initial encounter: Secondary | ICD-10-CM | POA: Diagnosis present

## 2023-05-21 LAB — COMPREHENSIVE METABOLIC PANEL
ALT: 16 U/L (ref 0–44)
AST: 22 U/L (ref 15–41)
Albumin: 4.1 g/dL (ref 3.5–5.0)
Alkaline Phosphatase: 67 U/L (ref 38–126)
Anion gap: 10 (ref 5–15)
BUN: 23 mg/dL (ref 8–23)
CO2: 25 mmol/L (ref 22–32)
Calcium: 9 mg/dL (ref 8.9–10.3)
Chloride: 105 mmol/L (ref 98–111)
Creatinine, Ser: 0.8 mg/dL (ref 0.44–1.00)
GFR, Estimated: >60 mL/min (ref >60)
Glucose, Bld: 181 mg/dL — ABNORMAL HIGH (ref 70–99)
Potassium: 3.7 mmol/L (ref 3.5–5.1)
Sodium: 140 mmol/L (ref 135–145)
Total Bilirubin: 0.7 mg/dL (ref 0.3–1.2)
Total Protein: 7.5 g/dL (ref 6.5–8.1)

## 2023-05-21 LAB — URINALYSIS, ROUTINE W REFLEX MICROSCOPIC
Bilirubin Urine: NEGATIVE
Glucose, UA: NEGATIVE mg/dL
Ketones, ur: NEGATIVE mg/dL
Nitrite: POSITIVE — AB
Protein, ur: 100 mg/dL — AB
Specific Gravity, Urine: 1.019 (ref 1.005–1.030)
WBC, UA: >50 WBC/hpf (ref 0–5)
pH: 5 (ref 5.0–8.0)

## 2023-05-21 LAB — CBC WITH DIFFERENTIAL/PLATELET
Abs Immature Granulocytes: 0.08 10*3/uL — ABNORMAL HIGH (ref 0.00–0.07)
Basophils Absolute: 0.1 10*3/uL (ref 0.0–0.1)
Basophils Relative: 1 %
Eosinophils Absolute: 0.3 10*3/uL (ref 0.0–0.5)
Eosinophils Relative: 4 %
HCT: 48.2 % — ABNORMAL HIGH (ref 36.0–46.0)
Hemoglobin: 15.6 g/dL — ABNORMAL HIGH (ref 12.0–15.0)
Immature Granulocytes: 1 %
Lymphocytes Relative: 10 %
Lymphs Abs: 0.9 10*3/uL (ref 0.7–4.0)
MCH: 29.8 pg (ref 26.0–34.0)
MCHC: 32.4 g/dL (ref 30.0–36.0)
MCV: 92 fL (ref 80.0–100.0)
Monocytes Absolute: 0.5 10*3/uL (ref 0.1–1.0)
Monocytes Relative: 6 %
Neutro Abs: 7.3 10*3/uL (ref 1.7–7.7)
Neutrophils Relative %: 78 %
Platelets: 281 10*3/uL (ref 150–400)
RBC: 5.24 MIL/uL — ABNORMAL HIGH (ref 3.87–5.11)
RDW: 13.7 % (ref 11.5–15.5)
WBC: 9.2 10*3/uL (ref 4.0–10.5)
nRBC: 0 % (ref 0.0–0.2)

## 2023-05-21 LAB — LIPASE, BLOOD: Lipase: 70 U/L — ABNORMAL HIGH (ref 11–51)

## 2023-05-21 LAB — TROPONIN I (HIGH SENSITIVITY)
Troponin I (High Sensitivity): 88 ng/L — ABNORMAL HIGH (ref <18)
Troponin I (High Sensitivity): 85 ng/L — ABNORMAL HIGH (ref <18)

## 2023-05-21 LAB — SARS CORONAVIRUS 2 BY RT PCR: SARS Coronavirus 2 by RT PCR: NEGATIVE

## 2023-05-21 MED ORDER — CEFADROXIL 500 MG PO CAPS
1000.0000 mg | ORAL_CAPSULE | Freq: Every day | ORAL | Status: DC
  Filled 2023-05-21: qty 2

## 2023-05-21 MED ORDER — ONDANSETRON HCL 4 MG PO TABS: 4.0000 mg | ORAL_TABLET | Freq: Four times a day (QID) | ORAL | Status: DC | PRN

## 2023-05-21 MED ORDER — ONDANSETRON HCL 4 MG/2ML IJ SOLN
4.0000 mg | Freq: Once | INTRAMUSCULAR | Status: AC
  Administered 2023-05-21: 4 mg via INTRAVENOUS
  Filled 2023-05-21: qty 2

## 2023-05-21 MED ORDER — HYDROCODONE-ACETAMINOPHEN 5-325 MG PO TABS
1.0000 | ORAL_TABLET | Freq: Four times a day (QID) | ORAL | Status: DC | PRN
  Administered 2023-05-22 – 2023-05-27 (×13): 1 via ORAL
  Filled 2023-05-21 (×13): qty 1

## 2023-05-21 MED ORDER — SENNOSIDES-DOCUSATE SODIUM 8.6-50 MG PO TABS
1.0000 | ORAL_TABLET | Freq: Every day | ORAL | Status: DC
Start: 2023-05-21 — End: 2023-05-27
  Administered 2023-05-22 – 2023-05-25 (×5): 1 via ORAL
  Filled 2023-05-21 (×6): qty 1

## 2023-05-21 MED ORDER — AMLODIPINE BESYLATE 5 MG PO TABS
5.0000 mg | ORAL_TABLET | Freq: Every day | ORAL | Status: DC
  Administered 2023-05-22: 5 mg via ORAL
  Filled 2023-05-21: qty 1

## 2023-05-21 MED ORDER — INSULIN ASPART 100 UNIT/ML IJ SOLN
0.0000 [IU] | Freq: Three times a day (TID) | INTRAMUSCULAR | Status: DC
  Administered 2023-05-22 – 2023-05-23 (×2): 1 [IU] via SUBCUTANEOUS
  Administered 2023-05-26: 2 [IU] via SUBCUTANEOUS
  Administered 2023-05-27: 1 [IU] via SUBCUTANEOUS
  Filled 2023-05-21: qty 0.06

## 2023-05-21 MED ORDER — SODIUM CHLORIDE 0.9 % IV SOLN
1.0000 g | INTRAVENOUS | Status: DC
  Administered 2023-05-22 – 2023-05-23 (×2): 1 g via INTRAVENOUS
  Filled 2023-05-21 (×2): qty 10

## 2023-05-21 MED ORDER — BISACODYL 10 MG RE SUPP
10.0000 mg | Freq: Every day | RECTAL | Status: DC
  Administered 2023-05-22 – 2023-05-24 (×3): 10 mg via RECTAL
  Filled 2023-05-21 (×6): qty 1

## 2023-05-21 MED ORDER — ALBUTEROL SULFATE HFA 108 (90 BASE) MCG/ACT IN AERS: 2.0000 | INHALATION_SPRAY | RESPIRATORY_TRACT | Status: DC | PRN

## 2023-05-21 MED ORDER — SODIUM CHLORIDE 0.9 % IV SOLN
1.0000 g | Freq: Once | INTRAVENOUS | Status: AC
Start: 2023-05-21 — End: 2023-05-21
  Administered 2023-05-21: 1 g via INTRAVENOUS
  Filled 2023-05-21: qty 10

## 2023-05-21 MED ORDER — ONDANSETRON HCL 4 MG/2ML IJ SOLN
4.0000 mg | Freq: Four times a day (QID) | INTRAMUSCULAR | Status: DC | PRN
  Filled 2023-05-21: qty 2

## 2023-05-21 MED ORDER — IOHEXOL 300 MG/ML  SOLN
100.0000 mL | Freq: Once | INTRAMUSCULAR | Status: AC | PRN
  Administered 2023-05-21: 100 mL via INTRAVENOUS

## 2023-05-21 MED ORDER — ALBUTEROL SULFATE (2.5 MG/3ML) 0.083% IN NEBU
2.5000 mg | INHALATION_SOLUTION | RESPIRATORY_TRACT | Status: DC | PRN
  Administered 2023-05-26 (×2): 2.5 mg via RESPIRATORY_TRACT
  Filled 2023-05-21 (×2): qty 3

## 2023-05-21 MED ORDER — POLYETHYLENE GLYCOL 3350 17 G PO PACK: 17.0000 g | PACK | Freq: Every day | ORAL | Status: DC

## 2023-05-21 MED ORDER — ENOXAPARIN SODIUM 40 MG/0.4ML IJ SOSY
40.0000 mg | PREFILLED_SYRINGE | INTRAMUSCULAR | Status: DC
Start: 2023-05-22 — End: 2023-05-27
  Administered 2023-05-22 – 2023-05-27 (×6): 40 mg via SUBCUTANEOUS
  Filled 2023-05-21 (×6): qty 0.4

## 2023-05-21 NOTE — ED Provider Notes (Signed)
[place] [facility] AT [facility] Provider Note   CSN: [id number] Arrival date & time: 05/21/23  1637     History  Chief Complaint  Patient presents with   Nausea   Emesis    Vernestine Rients Phebus is a 89 y.o. female with history of COPD, hypertension, renal carcinoma status post left nephrectomy, presenting to the ED with complaint of generalized fatigue and nausea, vomiting.  Patient reports she has had "low energy" this week.  She notes that she ate a salad that she made at home this afternoon around 1 PM, which involved the dressing she cannot recall as well as a variety of vegetables.  He said within 1 hour venous L she began to feel very queasy and had to sit down on the toilet, had nausea and dry heaving, also some cramping abdominal pain.  She says she felt too weak to get off the toilet.  She denies chest pain.  She says since arriving in the ED she is feeling better.  She does have a history of recurring UTI's but denies these are active symptoms.  HPI     Home Medications Prior to Admission medications   Medication Sig Start Date End Date Taking? Authorizing Provider  amLODipine (NORVASC) 5 MG tablet Take 2 tablets (10 mg total) by mouth daily. 02/17/23   Leatha Gilding, MD  Benzalkonium Chloride (DIABETIC BASICS HEALTHY FOOT EX) Apply 1 application topically daily as needed (foot pain). OTC diabetic foot cream    [provider]  CRANBERRY PO Take 2 capsules by mouth 2 (two) times daily. 42mg     [provider]  ESTRACE VAGINAL 0.1 MG/GM vaginal cream Apply locally every other night at bedtime 01/16/16   [provider]  HYDROcodone-acetaminophen (NORCO/VICODIN) 5-325 MG tablet Take 1 tablet by mouth every 6 (six) hours as needed for moderate pain. 02/17/23   Leatha Gilding, MD  hydrOXYzine (ATARAX) 10 MG tablet Take 10 mg by mouth daily as needed for itching. 11/13/21   [provider]  Multiple Vitamins-Minerals  (EYE VITAMINS PO) Take by mouth. AREDS2    [provider]  sitaGLIPtin (JANUVIA) 50 MG tablet Take 50 mg by mouth daily.    [provider]  Vitamin D, Ergocalciferol, (DRISDOL) 1.25 MG (50000 UNIT) CAPS capsule Take 50,000 Units by mouth once a week. 11/29/21   [provider]  ferrous sulfate 325 (65 FE) MG tablet Take 1 tablet (325 mg total) by mouth daily with breakfast. 12/20/21 10/09/22  Erick Blinks, MD  fluticasone (FLONASE) 50 MCG/ACT nasal spray INSTILL 2 SPRAYS INTO EACH NOSTRIL ONCE DAILY Patient taking differently: Place 2 sprays into both nostrils 2 (two) times daily. INSTILL 2 SPRAYS INTO EACH NOSTRIL ONCE DAILY 04/28/20 10/09/22  Copland, Gwenlyn Found, MD      Allergies    Lisinopril, Codeine, and Verapamil    Review of Systems   Review of Systems  Physical Exam Updated Vital Signs BP (!) 163/103   Pulse 84   Temp 97.8 F (36.6 C)   Resp 18   LMP  (LMP Unknown)   SpO2 97%  Physical Exam Constitutional:      General: She is not in acute distress. HENT:     Head: Normocephalic and atraumatic.  Eyes:     Conjunctiva/sclera: Conjunctivae normal.     Pupils: Pupils are equal, round, and reactive to light.  Cardiovascular:     Rate and Rhythm: Normal rate and regular rhythm.  Pulmonary:  Effort: Pulmonary effort is normal. No respiratory distress.     Breath sounds: Normal breath sounds.  Abdominal:     General: There is no distension.     Tenderness: There is no abdominal tenderness.  Skin:    General: Skin is warm and dry.  Neurological:     General: No focal deficit present.     Mental Status: She is alert and oriented to person, place, and time. Mental status is at baseline.  Psychiatric:        Mood and Affect: Mood normal.        Behavior: Behavior normal.     ED Results / Procedures / Treatments   Labs (all labs ordered are listed, but only abnormal results are displayed) Labs Reviewed  COMPREHENSIVE METABOLIC PANEL -  Abnormal; Notable for the following components:      Result Value   Glucose, Bld 181 (*)    All other components within normal limits  CBC WITH DIFFERENTIAL/PLATELET - Abnormal; Notable for the following components:   RBC 5.24 (*)    Hemoglobin 15.6 (*)    HCT 48.2 (*)    Abs Immature Granulocytes 0.08 (*)    All other components within normal limits  LIPASE, BLOOD - Abnormal; Notable for the following components:   Lipase 70 (*)    All other components within normal limits  URINALYSIS, ROUTINE W REFLEX MICROSCOPIC - Abnormal; Notable for the following components:   APPearance CLOUDY (*)    Hgb urine dipstick SMALL (*)    Protein, ur 100 (*)    Nitrite POSITIVE (*)    Leukocytes,Ua LARGE (*)    Bacteria, UA MANY (*)    All other components within normal limits  TROPONIN I (HIGH SENSITIVITY) - Abnormal; Notable for the following components:   Troponin I (High Sensitivity) 88 (*)    All other components within normal limits  TROPONIN I (HIGH SENSITIVITY) - Abnormal; Notable for the following components:   Troponin I (High Sensitivity) 85 (*)    All other components within normal limits  SARS CORONAVIRUS 2 BY RT PCR  URINE CULTURE    EKG None  Radiology CT ABDOMEN PELVIS W CONTRAST  Result Date: 05/21/2023 CLINICAL DATA:  Acute abdominal pain EXAM: CT ABDOMEN AND PELVIS WITH CONTRAST TECHNIQUE: Multidetector CT imaging of the abdomen and pelvis was performed using the standard protocol following bolus administration of intravenous contrast. RADIATION DOSE REDUCTION: This exam was performed according to the departmental dose-optimization program which includes automated exposure control, adjustment of the mA and/or kV according to patient size and/or use of iterative reconstruction technique. CONTRAST:  OMNIPAQUE IOHEXOL 300 MG/ML  SOLN COMPARISON:  10/18/2022 FINDINGS: Lower chest: Mild bronchial wall thickening and bronchiectasis is noted in the bases bilaterally stable from  the prior exam. Hepatobiliary: No focal liver abnormality is seen. No gallstones, gallbladder wall thickening, or biliary dilatation. Pancreas: Unremarkable. No pancreatic ductal dilatation or surrounding inflammatory changes. Spleen: Normal in size without focal abnormality. Adrenals/Urinary Tract: Adrenal glands are within normal limits. Changes of prior left nephrectomy are noted. Right kidney is well visualized and within normal limits. No obstructive changes are seen. Bladder is partially distended. Stomach/Bowel: Scattered diverticular changes noted without evidence of diverticulitis. No obstructive or inflammatory changes are seen. The appendix is within normal limits. Small bowel and stomach are unremarkable. Vascular/Lymphatic: Aortic atherosclerosis. No enlarged abdominal or pelvic lymph nodes. Reproductive: Uterus and bilateral adnexa are unremarkable. Other: No free fluid noted.  No focal herniation is  seen. Musculoskeletal: Right hip replacement is noted. Degenerative changes of the thoracic spine are seen. No acute abnormality is noted. Chronic T12 compression fracture is seen. IMPRESSION: Diverticulosis without diverticulitis. Status post left nephrectomy. No acute abnormality noted. Electronically Signed   By: Alcide Clever M.D.   On: 05/21/2023 21:40    Procedures Procedures    Medications Ordered in ED Medications  cefTRIAXone (ROCEPHIN) 1 g in sodium chloride 0.9 % 100 mL IVPB (1 g Intravenous New Bag/Given 05/21/23 2308)  ondansetron (ZOFRAN) injection 4 mg (4 mg Intravenous Given 05/21/23 1713)  iohexol (OMNIPAQUE) 300 MG/ML solution 100 mL (100 mLs Intravenous Contrast Given 05/21/23 1945)    ED Course/ Medical Decision Making/ A&P Clinical Course as of 05/21/23 2313  Wed May 21, 2023  1909 Patient is EKG is available on file but does not show any acute ischemic findings.  Initial troponin has some mild elevation to it, 88, will need to repeat [MT]  2256 Admitted to the  hospitalist [MT]    Clinical Course User Index [MT] Micaiah Litle, Kermit Balo, MD                                 Medical Decision Making Amount and/or Complexity of Data Reviewed Labs: ordered. Radiology: ordered. ECG/medicine tests: ordered.  Risk Prescription drug management. Decision regarding hospitalization.   This patient presents to the ED with concern for nausea, vomiting, fatigue. This involves an extensive number of treatment options, and is a complaint that carries with it a high risk of complications and morbidity.  The differential diagnosis includes atypical ACS versus infection versus UTI versus foodborne illness versus other  Co-morbidities that complicate the patient evaluation: History of recurrent UTIs at high risk of UTI  Additional history obtained from EMS  External records from outside source obtained and reviewed including prior UTI results July  I ordered and personally interpreted labs.  The pertinent results include: UA with evidence of infection.  Troponins have some elevation 88-85, flat on repeat.  I ordered imaging studies including CT abdomen pelvis I independently visualized and interpreted imaging which showed no emergent findings I agree with the radiologist interpretation  The patient was maintained on a cardiac monitor.  I personally viewed and interpreted the cardiac monitored which showed an underlying rhythm of: Sinus rhythm  Per my interpretation the patient's ECG shows sinus rhythm no acute ischemic findings  I ordered medication including IV Zofran for nausea, IV Rocephin for UTI  I have reviewed the patients home medicines and have made adjustments as needed  Test Considered: Low suspicion for acute PE  After the interventions noted above, I reevaluated the patient and found that they have: stayed the same  Social Determinants of Health:discussion with hospice agency  Dispostion:  After consideration of the diagnostic results and  the patients response to treatment, I feel that the patent would benefit from medical admission.  The patient is not having any active chest pain at this time and her EKG does not show acute ischemia.  I do not see indication for IV heparin.  Hospital team can continue trending troponins as needed overnight  *  Please note that I was in contact with the patient's hospice agency.  She was placed on hospice for "COPD."  The patient herself is not aware of why she was placed on hospice, and based on her lab work and presentation today, I do not see evidence of  imminent impending death.  Her hospice agency representative at the number above informed me that the patient will need to be discontinued from hospice due to [facility] billing conflicts if she is requiring hospitalization.  They have asked that the hospice agency be contacted with any change or updating the patient status, including plan for discharge home or discharged to rehab.  The patient may be eligible to return to hospice at a later time.  [facility] - [phone] Iona Hansen        Final Clinical Impression(s) / ED Diagnoses Final diagnoses:  Nausea and vomiting, unspecified vomiting type  Acute cystitis without hematuria  Elevated troponin    Rx / DC Orders ED Discharge Orders     None         Terald Sleeper, MD 05/21/23 2313

## 2023-05-21 NOTE — ED Triage Notes (Signed)
Pt BIBA from home with c/o N/V after eating a salad for lunch. Given IV zofran. Pt states she now feels better.  20G LFA  180/100-HR 80's- 98%

## 2023-05-22 ENCOUNTER — Encounter (HOSPITAL_COMMUNITY): Payer: Self-pay | Admitting: Internal Medicine

## 2023-05-22 ENCOUNTER — Other Ambulatory Visit: Payer: Self-pay

## 2023-05-22 DIAGNOSIS — B9620 Unspecified Escherichia coli [E. coli] as the cause of diseases classified elsewhere: Secondary | ICD-10-CM | POA: Diagnosis present

## 2023-05-22 DIAGNOSIS — I129 Hypertensive chronic kidney disease with stage 1 through stage 4 chronic kidney disease, or unspecified chronic kidney disease: Secondary | ICD-10-CM | POA: Diagnosis present

## 2023-05-22 DIAGNOSIS — J4489 Other specified chronic obstructive pulmonary disease: Secondary | ICD-10-CM | POA: Diagnosis present

## 2023-05-22 DIAGNOSIS — Z818 Family history of other mental and behavioral disorders: Secondary | ICD-10-CM | POA: Diagnosis not present

## 2023-05-22 DIAGNOSIS — R112 Nausea with vomiting, unspecified: Secondary | ICD-10-CM

## 2023-05-22 DIAGNOSIS — K5900 Constipation, unspecified: Secondary | ICD-10-CM | POA: Insufficient documentation

## 2023-05-22 DIAGNOSIS — N1831 Chronic kidney disease, stage 3a: Secondary | ICD-10-CM | POA: Diagnosis present

## 2023-05-22 DIAGNOSIS — N3000 Acute cystitis without hematuria: Principal | ICD-10-CM

## 2023-05-22 DIAGNOSIS — Z833 Family history of diabetes mellitus: Secondary | ICD-10-CM | POA: Diagnosis not present

## 2023-05-22 DIAGNOSIS — Z1611 Resistance to penicillins: Secondary | ICD-10-CM | POA: Diagnosis present

## 2023-05-22 DIAGNOSIS — R55 Syncope and collapse: Secondary | ICD-10-CM | POA: Insufficient documentation

## 2023-05-22 DIAGNOSIS — I5A Non-ischemic myocardial injury (non-traumatic): Secondary | ICD-10-CM | POA: Diagnosis present

## 2023-05-22 DIAGNOSIS — E785 Hyperlipidemia, unspecified: Secondary | ICD-10-CM | POA: Diagnosis present

## 2023-05-22 DIAGNOSIS — E1122 Type 2 diabetes mellitus with diabetic chronic kidney disease: Secondary | ICD-10-CM | POA: Diagnosis present

## 2023-05-22 DIAGNOSIS — Z1152 Encounter for screening for COVID-19: Secondary | ICD-10-CM | POA: Diagnosis not present

## 2023-05-22 DIAGNOSIS — Z8249 Family history of ischemic heart disease and other diseases of the circulatory system: Secondary | ICD-10-CM | POA: Diagnosis not present

## 2023-05-22 DIAGNOSIS — K5903 Drug induced constipation: Secondary | ICD-10-CM | POA: Diagnosis present

## 2023-05-22 DIAGNOSIS — Z86718 Personal history of other venous thrombosis and embolism: Secondary | ICD-10-CM | POA: Diagnosis not present

## 2023-05-22 DIAGNOSIS — Z825 Family history of asthma and other chronic lower respiratory diseases: Secondary | ICD-10-CM | POA: Diagnosis not present

## 2023-05-22 DIAGNOSIS — Z66 Do not resuscitate: Secondary | ICD-10-CM | POA: Diagnosis present

## 2023-05-22 DIAGNOSIS — G8929 Other chronic pain: Secondary | ICD-10-CM | POA: Diagnosis present

## 2023-05-22 DIAGNOSIS — R1310 Dysphagia, unspecified: Secondary | ICD-10-CM | POA: Diagnosis present

## 2023-05-22 DIAGNOSIS — R7989 Other specified abnormal findings of blood chemistry: Secondary | ICD-10-CM | POA: Diagnosis not present

## 2023-05-22 DIAGNOSIS — Z823 Family history of stroke: Secondary | ICD-10-CM | POA: Diagnosis not present

## 2023-05-22 DIAGNOSIS — I2489 Other forms of acute ischemic heart disease: Secondary | ICD-10-CM | POA: Diagnosis not present

## 2023-05-22 DIAGNOSIS — Z905 Acquired absence of kidney: Secondary | ICD-10-CM | POA: Diagnosis not present

## 2023-05-22 DIAGNOSIS — Z7984 Long term (current) use of oral hypoglycemic drugs: Secondary | ICD-10-CM | POA: Diagnosis not present

## 2023-05-22 DIAGNOSIS — Z801 Family history of malignant neoplasm of trachea, bronchus and lung: Secondary | ICD-10-CM | POA: Diagnosis not present

## 2023-05-22 LAB — URINALYSIS, ROUTINE W REFLEX MICROSCOPIC
Bilirubin Urine: NEGATIVE
Glucose, UA: NEGATIVE mg/dL
Ketones, ur: NEGATIVE mg/dL
Nitrite: POSITIVE — AB
Protein, ur: NEGATIVE mg/dL
Specific Gravity, Urine: 1.018 (ref 1.005–1.030)
WBC, UA: >50 WBC/hpf (ref 0–5)
pH: 7 (ref 5.0–8.0)

## 2023-05-22 LAB — MAGNESIUM: Magnesium: 2.1 mg/dL (ref 1.7–2.4)

## 2023-05-22 LAB — GLUCOSE, CAPILLARY
Glucose-Capillary: 132 mg/dL — ABNORMAL HIGH (ref 70–99)
Glucose-Capillary: 108 mg/dL — ABNORMAL HIGH (ref 70–99)
Glucose-Capillary: 164 mg/dL — ABNORMAL HIGH (ref 70–99)
Glucose-Capillary: 107 mg/dL — ABNORMAL HIGH (ref 70–99)
Glucose-Capillary: 109 mg/dL — ABNORMAL HIGH (ref 70–99)

## 2023-05-22 LAB — CBC
HCT: 42.4 % (ref 36.0–46.0)
Hemoglobin: 14.1 g/dL (ref 12.0–15.0)
MCH: 30.3 pg (ref 26.0–34.0)
MCHC: 33.3 g/dL (ref 30.0–36.0)
MCV: 91 fL (ref 80.0–100.0)
Platelets: 271 10*3/uL (ref 150–400)
RBC: 4.66 MIL/uL (ref 3.87–5.11)
RDW: 13.7 % (ref 11.5–15.5)
WBC: 10.1 10*3/uL (ref 4.0–10.5)
nRBC: 0 % (ref 0.0–0.2)

## 2023-05-22 LAB — BASIC METABOLIC PANEL
Anion gap: 8 (ref 5–15)
BUN: 16 mg/dL (ref 8–23)
CO2: 26 mmol/L (ref 22–32)
Calcium: 8.9 mg/dL (ref 8.9–10.3)
Chloride: 105 mmol/L (ref 98–111)
Creatinine, Ser: 0.62 mg/dL (ref 0.44–1.00)
GFR, Estimated: >60 mL/min (ref >60)
Glucose, Bld: 122 mg/dL — ABNORMAL HIGH (ref 70–99)
Potassium: 3.7 mmol/L (ref 3.5–5.1)
Sodium: 139 mmol/L (ref 135–145)

## 2023-05-22 LAB — PHOSPHORUS: Phosphorus: 3.1 mg/dL (ref 2.5–4.6)

## 2023-05-22 MED ORDER — HYDRALAZINE HCL 10 MG PO TABS
10.0000 mg | ORAL_TABLET | Freq: Four times a day (QID) | ORAL | Status: DC | PRN
Start: 2023-05-22 — End: 2023-05-27

## 2023-05-22 MED ORDER — AMLODIPINE BESYLATE 10 MG PO TABS
10.0000 mg | ORAL_TABLET | Freq: Every day | ORAL | Status: DC
  Administered 2023-05-23 – 2023-05-27 (×5): 10 mg via ORAL
  Filled 2023-05-22 (×5): qty 1

## 2023-05-22 MED ORDER — POLYETHYLENE GLYCOL 3350 17 G PO PACK
17.0000 g | PACK | Freq: Two times a day (BID) | ORAL | Status: AC
  Administered 2023-05-22 – 2023-05-23 (×4): 17 g via ORAL
  Filled 2023-05-22 (×4): qty 1

## 2023-05-22 MED ORDER — SORBITOL 70 % SOLN
30.0000 mL | Freq: Once | Status: AC
  Administered 2023-05-22: 30 mL via ORAL
  Filled 2023-05-22: qty 30

## 2023-05-22 NOTE — ED Notes (Signed)
ED TO INPATIENT HANDOFF REPORT  Name/Age/Gender Alexa [name] 89 y.o. female  Code Status    Code Status Orders  (From admission, onward)           Start     Ordered   05/21/23 2332  Do not attempt resuscitation (DNR)- Limited -Do Not Intubate (DNI)  Continuous       Question Answer Comment  If pulseless and not breathing No CPR or chest compressions.   In Pre-Arrest Conditions (Patient Is Breathing and Has A Pulse) Do not intubate. Provide all appropriate non-invasive medical interventions. Avoid ICU transfer unless indicated or required.   Consent: Discussion documented in EHR or advanced directives reviewed      05/21/23 2335           Code Status History     Date Active Date Inactive Code Status Order ID Comments User Context   02/13/2023 2059 02/17/2023 1722 DNR [id number]  Charlsie Quest, MD ED   12/15/2021 0554 12/20/2021 0021 DNR [id number]  Briscoe Deutscher, MD Inpatient   11/22/2014 1451 11/23/2014 1452 Full Code [id number]  Conley Canal Inpatient   02/05/2012 1623 02/07/2012 1406 Full Code [id number]  Vernie Shanks, RN Inpatient       Home/SNF/Other Home  Chief Complaint Urinary tract infection [N39.0]  Level of Care/Admitting Diagnosis ED Disposition     ED Disposition  Admit   Condition  --   Comment  Hospital Area: [facility] [[id number]]  Level of Care: Med-Surg [16]  May place patient in observation at [facility] or [facility] if equivalent level of care is available:: No  Covid Evaluation: Asymptomatic - no recent exposure (last 10 days) testing not required  Diagnosis: Urinary tract infection [324401]  Admitting Physician: Dolly Rias [[id number]]  Attending Physician: Dolly Rias [[id number]]          Medical History Past Medical History:  Diagnosis Date   Angio-edema    Asthma    Chronic obstructive pulmonary disease (HCC)    Diverticulosis    Diverticulosis of colon (without mention of hemorrhage)     Eczema    Esophageal reflux    History of colonic polyps 06/05/05   hyperplastic   Internal hemorrhoids    Irritable bowel syndrome    Ischemic colitis (HCC)    Malignant neoplasm of kidney and other and unspecified urinary organs    renal carcinoma, left nephrectomy in 2008-per patient.   Other and unspecified hyperlipidemia    Personal history of venous thrombosis and embolism    PTE after nephrectomy   Spinal stenosis    Type II or unspecified type diabetes mellitus without mention of complication, not stated as uncontrolled 02/05/2012   pt states it was in 2007   Unspecified essential hypertension    Urticaria     Allergies Allergies  Allergen Reactions   Lisinopril     Cough D/Ced by Dr Sherene Sires   Codeine     nausea   Verapamil     Pain in feet    IV Location/Drains/Wounds Patient Lines/Drains/Airways Status     Active Line/Drains/Airways     Name Placement date Placement time Site Days   Peripheral IV 05/21/23 20 G Anterior;Left Forearm 05/21/23  1709  Forearm  1   Peripheral IV 05/21/23 20 G Right Antecubital 05/21/23  1725  Antecubital  1            Labs/Imaging Results for orders placed or performed during the hospital  encounter of 05/21/23 (from the past 48 hour(s))  Comprehensive metabolic panel     Status: Abnormal   Collection Time: 05/21/23  5:23 PM  Result Value Ref Range   Sodium 140 135 - 145 mmol/L   Potassium 3.7 3.5 - 5.1 mmol/L   Chloride 105 98 - 111 mmol/L   CO2 25 22 - 32 mmol/L   Glucose, Bld 181 (H) 70 - 99 mg/dL    Comment: Glucose reference range applies only to samples taken after fasting for at least 8 hours.   BUN 23 8 - 23 mg/dL   Creatinine, Ser 6.30 0.44 - 1.00 mg/dL   Calcium 9.0 8.9 - 16.0 mg/dL   Total Protein 7.5 6.5 - 8.1 g/dL   Albumin 4.1 3.5 - 5.0 g/dL   AST 22 15 - 41 U/L   ALT 16 0 - 44 U/L   Alkaline Phosphatase 67 38 - 126 U/L   Total Bilirubin 0.7 0.3 - 1.2 mg/dL   GFR, Estimated >10 >93 mL/min    Comment:  (NOTE) Calculated using the CKD-EPI Creatinine Equation (2021)    Anion gap 10 5 - 15    Comment: Performed at [facility], [address] [place], [place] [address]  CBC with Differential     Status: Abnormal   Collection Time: 05/21/23  5:23 PM  Result Value Ref Range   WBC 9.2 4.0 - 10.5 K/uL   RBC 5.24 (H) 3.87 - 5.11 MIL/uL   Hemoglobin 15.6 (H) 12.0 - 15.0 g/dL   HCT 32.2 (H) 02.5 - 42.7 %   MCV 92.0 80.0 - 100.0 fL   MCH 29.8 26.0 - 34.0 pg   MCHC 32.4 30.0 - 36.0 g/dL   RDW 06.2 37.6 - 28.3 %   Platelets 281 150 - 400 K/uL   nRBC 0.0 0.0 - 0.2 %   Neutrophils Relative % 78 %   Neutro Abs 7.3 1.7 - 7.7 K/uL   Lymphocytes Relative 10 %   Lymphs Abs 0.9 0.7 - 4.0 K/uL   Monocytes Relative 6 %   Monocytes Absolute 0.5 0.1 - 1.0 K/uL   Eosinophils Relative 4 %   Eosinophils Absolute 0.3 0.0 - 0.5 K/uL   Basophils Relative 1 %   Basophils Absolute 0.1 0.0 - 0.1 K/uL   Immature Granulocytes 1 %   Abs Immature Granulocytes 0.08 (H) 0.00 - 0.07 K/uL    Comment: Performed at [facility], [address], [place], [place] [address]  Lipase, blood     Status: Abnormal   Collection Time: 05/21/23  5:23 PM  Result Value Ref Range   Lipase 70 (H) 11 - 51 U/L    Comment: Performed at [facility], [address], [place], [place] [address]  Troponin I (High Sensitivity)     Status: Abnormal   Collection Time: 05/21/23  5:23 PM  Result Value Ref Range   Troponin I (High Sensitivity) 88 (H) <18 ng/L    Comment: (NOTE) Elevated high sensitivity troponin I (hsTnI) values and significant  changes across serial measurements may suggest ACS but many other  chronic and acute conditions are known to elevate hsTnI results.  Refer to the "Links" section for chest pain algorithms and additional  guidance. Performed at [facility], [address], [place], [place] [address]   SARS Coronavirus 2 by RT PCR  (hospital order, performed in [facility] hospital lab) *cepheid single result test* Anterior Nasal Swab  Status: None   Collection Time: 05/21/23  5:23 PM   Specimen: Anterior Nasal Swab  Result Value Ref Range   SARS Coronavirus 2 by RT PCR NEGATIVE NEGATIVE    Comment: (NOTE) SARS-CoV-2 target nucleic acids are NOT DETECTED.  The SARS-CoV-2 RNA is generally detectable in upper and lower respiratory specimens during the acute phase of infection. The lowest concentration of SARS-CoV-2 viral copies this assay can detect is 250 copies / mL. A negative result does not preclude SARS-CoV-2 infection and should not be used as the sole basis for treatment or other patient management decisions.  A negative result may occur with improper specimen collection / handling, submission of specimen other than nasopharyngeal swab, presence of viral mutation(s) within the areas targeted by this assay, and inadequate number of viral copies (<250 copies / mL). A negative result must be combined with clinical observations, patient history, and epidemiological information.  Fact Sheet for Patients:   RoadLapTop.co.za  Fact Sheet for Healthcare Providers: http://kim-miller.com/  This test is not yet approved or  cleared by the [place] FDA and has been authorized for detection and/or diagnosis of SARS-CoV-2 by FDA under an Emergency Use Authorization (EUA).  This EUA will remain in effect (meaning this test can be used) for the duration of the COVID-19 declaration under Section 564(b)(1) of the Act, 21 U.S.C. section 360bbb-3(b)(1), unless the authorization is terminated or revoked sooner.  Performed at [facility], [address] [place], [place] [address]   Urinalysis, Routine w reflex microscopic -Urine, Clean Catch     Status: Abnormal   Collection Time: 05/21/23  8:07 PM  Result Value Ref Range   Color, Urine YELLOW  YELLOW   APPearance CLOUDY (A) CLEAR   Specific Gravity, Urine 1.019 1.005 - 1.030   pH 5.0 5.0 - 8.0   Glucose, UA NEGATIVE NEGATIVE mg/dL   Hgb urine dipstick SMALL (A) NEGATIVE   Bilirubin Urine NEGATIVE NEGATIVE   Ketones, ur NEGATIVE NEGATIVE mg/dL   Protein, ur 782 (A) NEGATIVE mg/dL   Nitrite POSITIVE (A) NEGATIVE   Leukocytes,Ua LARGE (A) NEGATIVE   RBC / HPF 21-50 0 - 5 RBC/hpf   WBC, UA >50 0 - 5 WBC/hpf   Bacteria, UA MANY (A) NONE SEEN   Squamous Epithelial / HPF 11-20 0 - 5 /HPF   WBC Clumps PRESENT    Hyaline Casts, UA PRESENT     Comment: Performed at [facility], [address] [place], [place] [address]  Troponin I (High Sensitivity)     Status: Abnormal   Collection Time: 05/21/23  8:08 PM  Result Value Ref Range   Troponin I (High Sensitivity) 85 (H) <18 ng/L    Comment: (NOTE) Elevated high sensitivity troponin I (hsTnI) values and significant  changes across serial measurements may suggest ACS but many other  chronic and acute conditions are known to elevate hsTnI results.  Refer to the "Links" section for chest pain algorithms and additional  guidance. Performed at [facility], [address], [place], [place] [address]    CT ABDOMEN PELVIS W CONTRAST  Result Date: 05/21/2023 CLINICAL DATA:  Acute abdominal pain EXAM: CT ABDOMEN AND PELVIS WITH CONTRAST TECHNIQUE: Multidetector CT imaging of the abdomen and pelvis was performed using the standard protocol following bolus administration of intravenous contrast. RADIATION DOSE REDUCTION: This exam was performed according to the departmental dose-optimization program which includes automated exposure control, adjustment of the mA and/or kV according to patient  size and/or use of iterative reconstruction technique. CONTRAST:  OMNIPAQUE IOHEXOL 300 MG/ML  SOLN COMPARISON:  10/18/2022 FINDINGS: Lower chest: Mild bronchial wall thickening and bronchiectasis is noted in  the bases bilaterally stable from the prior exam. Hepatobiliary: No focal liver abnormality is seen. No gallstones, gallbladder wall thickening, or biliary dilatation. Pancreas: Unremarkable. No pancreatic ductal dilatation or surrounding inflammatory changes. Spleen: Normal in size without focal abnormality. Adrenals/Urinary Tract: Adrenal glands are within normal limits. Changes of prior left nephrectomy are noted. Right kidney is well visualized and within normal limits. No obstructive changes are seen. Bladder is partially distended. Stomach/Bowel: Scattered diverticular changes noted without evidence of diverticulitis. No obstructive or inflammatory changes are seen. The appendix is within normal limits. Small bowel and stomach are unremarkable. Vascular/Lymphatic: Aortic atherosclerosis. No enlarged abdominal or pelvic lymph nodes. Reproductive: Uterus and bilateral adnexa are unremarkable. Other: No free fluid noted.  No focal herniation is seen. Musculoskeletal: Right hip replacement is noted. Degenerative changes of the thoracic spine are seen. No acute abnormality is noted. Chronic T12 compression fracture is seen. IMPRESSION: Diverticulosis without diverticulitis. Status post left nephrectomy. No acute abnormality noted. Electronically Signed   By: Alcide Clever M.D.   On: 05/21/2023 21:40    Pending Labs Unresulted Labs (From admission, onward)     Start     Ordered   05/22/23 0500  Basic metabolic panel  Tomorrow morning,   R        05/21/23 2335   05/22/23 0500  CBC  Tomorrow morning,   R        05/21/23 2335   05/22/23 0500  Magnesium  Tomorrow morning,   R        05/21/23 2335   05/22/23 0500  Phosphorus  Tomorrow morning,   R        05/21/23 2335   05/21/23 2335  Urinalysis, Routine w reflex microscopic -Urine, Clean Catch  Once,   R       Comments: Repeat UA initial sample contaminated   Question:  Specimen Source  Answer:  Urine, Clean Catch   05/21/23 2335   05/21/23 2031  Urine  Culture  Add-on,   AD       Question:  Indication  Answer:  Dysuria   05/21/23 2030            Vitals/Pain Today's Vitals   05/21/23 1941 05/21/23 2100 05/21/23 2230 05/21/23 2345  BP: (!) 166/107 (!) 150/95 (!) 163/103   Pulse: 93 84 84   Resp: (!) 30 18    Temp: 97.8 F (36.6 C)   98.3 F (36.8 C)  TempSrc:    Oral  SpO2: 95% 93% 97%   PainSc:        Isolation Precautions No active isolations  Medications Medications  enoxaparin (LOVENOX) injection 40 mg (has no administration in time range)  bisacodyl (DULCOLAX) suppository 10 mg (has no administration in time range)  polyethylene glycol (MIRALAX / GLYCOLAX) packet 17 g (has no administration in time range)  senna-docusate (Senokot-S) tablet 1 tablet (has no administration in time range)  ondansetron (ZOFRAN) tablet 4 mg (has no administration in time range)    Or  ondansetron (ZOFRAN) injection 4 mg (has no administration in time range)  cefTRIAXone (ROCEPHIN) 1 g in sodium chloride 0.9 % 100 mL IVPB (has no administration in time range)  HYDROcodone-acetaminophen (NORCO/VICODIN) 5-325 MG per tablet 1 tablet (has no administration in time range)  amLODipine (NORVASC) tablet 5 mg (  has no administration in time range)  insulin aspart (novoLOG) injection 0-6 Units (has no administration in time range)  albuterol (PROVENTIL) (2.5 MG/3ML) 0.083% nebulizer solution 2.5 mg (has no administration in time range)  ondansetron (ZOFRAN) injection 4 mg (4 mg Intravenous Given 05/21/23 1713)  iohexol (OMNIPAQUE) 300 MG/ML solution 100 mL (100 mLs Intravenous Contrast Given 05/21/23 1945)  cefTRIAXone (ROCEPHIN) 1 g in sodium chloride 0.9 % 100 mL IVPB (0 g Intravenous Stopped 05/21/23 2332)    Mobility walks with person assist

## 2023-05-22 NOTE — H&P (Signed)
History and Physical    Azadeh Kalicki Lowy NFA:[id number] DOB: [birth year] DOA: 05/21/2023  PCP: Charlane Ferretti, DO   Patient coming from: Home   Chief Complaint:  Chief Complaint  Patient presents with   Nausea   Emesis    HPI:  Alexa [name] is a 89 y.o. female with hx of COPD, hypertension, diabetes, renal cell carcinoma with left-sided nephrectomy, spinal stenosis, chronic pain, recurrent UTI, who presents after acute onset of nausea and vomiting, presyncopal episode.  Reports symptom onset after dinner when she had a salad, initially felt well.  However later was on the commode and reports straining to have a bowel movement.  Had associated lower abdominal pain.  Developed diaphoresis, nausea, vomiting, presyncope.  Did not have a syncopal episode.  Family came to check on her and called EMS since she was unable to stand off the toilet.  Otherwise recently reports 1 week of generalized weakness, recent dysuria.  Also notes history of intermittent dysphagia.  And lastly reports having a colonic mass distally although had not had recent colonoscopy, takes suppositories daily to avoid constipation.  Denies any blood in the stool.  Of note she is currently on hospice care outpatient.  Reports that she called to inquire about hospice but was not sure about this, and then was signed up without her knowing.  However then was agreeable to hospice services and overall seems to be be okay with ongoing hospice care.  However did want to come into the hospital for observation and did not feel safe at home with her weakness and intolerance of p.o.'s.    Review of Systems:  ROS complete and negative except as marked above   Allergies  Allergen Reactions   Lisinopril     Cough D/Ced by Dr Sherene Sires   Codeine     nausea   Verapamil     Pain in feet    Prior to Admission medications   Medication Sig Start Date End Date Taking? Authorizing Provider  amLODipine (NORVASC) 5 MG tablet Take 2 tablets (10  mg total) by mouth daily. 02/17/23   Leatha Gilding, MD  Benzalkonium Chloride (DIABETIC BASICS HEALTHY FOOT EX) Apply 1 application topically daily as needed (foot pain). OTC diabetic foot cream    [provider]  CRANBERRY PO Take 2 capsules by mouth 2 (two) times daily. 42mg     [provider]  ESTRACE VAGINAL 0.1 MG/GM vaginal cream Apply locally every other night at bedtime 01/16/16   [provider]  HYDROcodone-acetaminophen (NORCO/VICODIN) 5-325 MG tablet Take 1 tablet by mouth every 6 (six) hours as needed for moderate pain. 02/17/23   Leatha Gilding, MD  hydrOXYzine (ATARAX) 10 MG tablet Take 10 mg by mouth daily as needed for itching. 11/13/21   [provider]  Multiple Vitamins-Minerals (EYE VITAMINS PO) Take by mouth. AREDS2    [provider]  sitaGLIPtin (JANUVIA) 50 MG tablet Take 50 mg by mouth daily.    [provider]  Vitamin D, Ergocalciferol, (DRISDOL) 1.25 MG (50000 UNIT) CAPS capsule Take 50,000 Units by mouth once a week. 11/29/21   [provider]  ferrous sulfate 325 (65 FE) MG tablet Take 1 tablet (325 mg total) by mouth daily with breakfast. 12/20/21 10/09/22  Erick Blinks, MD  fluticasone (FLONASE) 50 MCG/ACT nasal spray INSTILL 2 SPRAYS INTO EACH NOSTRIL ONCE DAILY Patient taking differently: Place 2 sprays into both nostrils 2 (two) times daily. INSTILL 2 SPRAYS INTO EACH NOSTRIL ONCE  DAILY 04/28/20 10/09/22  Copland, Gwenlyn Found, MD    Past Medical History:  Diagnosis Date   Angio-edema    Asthma    Chronic obstructive pulmonary disease (HCC)    Diverticulosis    Diverticulosis of colon (without mention of hemorrhage)    Eczema    Esophageal reflux    History of colonic polyps 06/05/05   hyperplastic   Internal hemorrhoids    Irritable bowel syndrome    Ischemic colitis (HCC)    Malignant neoplasm of kidney and other and unspecified urinary organs    renal carcinoma, left nephrectomy in 2008-per  patient.   Other and unspecified hyperlipidemia    Personal history of venous thrombosis and embolism    PTE after nephrectomy   Spinal stenosis    Type II or unspecified type diabetes mellitus without mention of complication, not stated as uncontrolled 02/05/2012   pt states it was in 2007   Unspecified essential hypertension    Urticaria     Past Surgical History:  Procedure Laterality Date   ADENOIDECTOMY     BREAST BIOPSY     COLONOSCOPY W/ POLYPECTOMY  2000   COLONOSCOPY W/ POLYPECTOMY  07/2004   Hyperplastic polyps   DILATION AND CURETTAGE OF UTERUS  03/2005   Uterine Polyps   NEPHRECTOMY  2007   right,Dr Dahlstedt   SEPTOPLASTY     TONSILLECTOMY     TOTAL HIP ARTHROPLASTY Right 12/16/2021   Procedure: TOTAL HIP ARTHROPLASTY ANTERIOR APPROACH;  Surgeon: Samson Frederic, MD;  Location: [facility];  Service: Orthopedics;  Laterality: Right;     reports that she has never smoked. She has never used smokeless tobacco. She reports that she does not drink alcohol and does not use drugs.  Family History  Problem Relation Age of Onset   Stroke Mother        onset in 14s   Diabetes Mother    Cancer Mother        bladder; nephrectomy for calculi/also uterine , TAH & BSO   Aneurysm Mother        thoracic   Depression Mother    Stroke Brother    Heart failure Brother    Heart attack Other        maternal family history   Heart failure Maternal Grandfather    Lung cancer Father        smoked   Heart failure Sister    Depression Sister    Alzheimer's disease Sister    COPD Sister    Aneurysm Brother        cns   Depression Maternal Aunt        X2   Bipolar disorder Sister    Alcoholism Neg Hx      Physical Exam: Vitals:   05/22/23 0015 05/22/23 0025 05/22/23 0030 05/22/23 0102  BP: (!) 179/106 (!) 174/100  (!) 182/89  Pulse: 81 81 82 90  Resp: (!) 31  19 18   Temp:    98.1 F (36.7 C)  TempSrc:    Oral  SpO2: 93% 93% 94% 98%  Weight:    49.2 kg  Height:    5'  2" (1.575 m)    Gen: Awake, alert, elderly, frail CV: Regular, normal S1, S2, no murmurs  Resp: Normal WOB, CTAB  Abd: Flat, normoactive, nontender MSK: Symmetric, no edema  Skin: No rashes or lesions to exposed skin  Neuro: Alert and interactive, fully oriented Psych: euthymic, appropriate    Data review:  Labs reviewed, notable for:   Lipase 70, other LFT normal High-sensitivity troponin 88, down to 85 Other chemistries unremarkable Blood counts normal UA appears contaminated, note of hyaline cast and WBC clumps  Micro:  Results for orders placed or performed during the hospital encounter of 05/21/23  SARS Coronavirus 2 by RT PCR (hospital order, performed in [facility] hospital lab) *cepheid single result test* Anterior Nasal Swab     Status: None   Collection Time: 05/21/23  5:23 PM   Specimen: Anterior Nasal Swab  Result Value Ref Range Status   SARS Coronavirus 2 by RT PCR NEGATIVE NEGATIVE Final    Comment: (NOTE) SARS-CoV-2 target nucleic acids are NOT DETECTED.  The SARS-CoV-2 RNA is generally detectable in upper and lower respiratory specimens during the acute phase of infection. The lowest concentration of SARS-CoV-2 viral copies this assay can detect is 250 copies / mL. A negative result does not preclude SARS-CoV-2 infection and should not be used as the sole basis for treatment or other patient management decisions.  A negative result may occur with improper specimen collection / handling, submission of specimen other than nasopharyngeal swab, presence of viral mutation(s) within the areas targeted by this assay, and inadequate number of viral copies (<250 copies / mL). A negative result must be combined with clinical observations, patient history, and epidemiological information.  Fact Sheet for Patients:   RoadLapTop.co.za  Fact Sheet for Healthcare Providers: http://kim-miller.com/  This test is not yet  approved or  cleared by the [place] FDA and has been authorized for detection and/or diagnosis of SARS-CoV-2 by FDA under an Emergency Use Authorization (EUA).  This EUA will remain in effect (meaning this test can be used) for the duration of the COVID-19 declaration under Section 564(b)(1) of the Act, 21 U.S.C. section 360bbb-3(b)(1), unless the authorization is terminated or revoked sooner.  Performed at [facility], [address] [place], [place] [address]     Imaging reviewed:  CT ABDOMEN PELVIS W CONTRAST  Result Date: 05/21/2023 CLINICAL DATA:  Acute abdominal pain EXAM: CT ABDOMEN AND PELVIS WITH CONTRAST TECHNIQUE: Multidetector CT imaging of the abdomen and pelvis was performed using the standard protocol following bolus administration of intravenous contrast. RADIATION DOSE REDUCTION: This exam was performed according to the departmental dose-optimization program which includes automated exposure control, adjustment of the mA and/or kV according to patient size and/or use of iterative reconstruction technique. CONTRAST:  OMNIPAQUE IOHEXOL 300 MG/ML  SOLN COMPARISON:  10/18/2022 FINDINGS: Lower chest: Mild bronchial wall thickening and bronchiectasis is noted in the bases bilaterally stable from the prior exam. Hepatobiliary: No focal liver abnormality is seen. No gallstones, gallbladder wall thickening, or biliary dilatation. Pancreas: Unremarkable. No pancreatic ductal dilatation or surrounding inflammatory changes. Spleen: Normal in size without focal abnormality. Adrenals/Urinary Tract: Adrenal glands are within normal limits. Changes of prior left nephrectomy are noted. Right kidney is well visualized and within normal limits. No obstructive changes are seen. Bladder is partially distended. Stomach/Bowel: Scattered diverticular changes noted without evidence of diverticulitis. No obstructive or inflammatory changes are seen. The appendix is within  normal limits. Small bowel and stomach are unremarkable. Vascular/Lymphatic: Aortic atherosclerosis. No enlarged abdominal or pelvic lymph nodes. Reproductive: Uterus and bilateral adnexa are unremarkable. Other: No free fluid noted.  No focal herniation is seen. Musculoskeletal: Right hip replacement is noted. Degenerative changes of the thoracic spine are seen. No acute abnormality is noted. Chronic T12 compression fracture is seen. IMPRESSION: Diverticulosis without diverticulitis.  Status post left nephrectomy. No acute abnormality noted. Electronically Signed   By: Alcide Clever M.D.   On: 05/21/2023 21:40    EKG:  First-degree AV block with PAC, LAD, LVH, poor R wave progression, inferior Q waves.  No acute ischemic changes  ED Course:  Treated with Zofran, ceftriaxone for possible UTI   Assessment/Plan:  89 y.o. female with hx COPD, hypertension, diabetes, renal cell carcinoma with left-sided nephrectomy, spinal stenosis, chronic pain, recurrent UTI, who presents after acute onset of nausea and vomiting, presyncopal episode.    N/V, abd pain, presyncopal episode Acute onset after a meal while on the toilet and straining to have a bowel movement.  Associated lower abdominal pain preceding diaphoresis and onset of nausea and vomiting.  Then having recurrent episodes of vomiting.  Evaluation here with relatively unremarkable chemistries.  Has low-level elevation in lipase.  On CT imaging no explanatory pathology.  Hepatobiliary appears normal, pancreas unremarkable, diverticulosis without other abnormalities in GI tract. Suspect vasovagal episode in the setting of her abdominal pain considering constellation of symptoms.  Other considerations would include toxinosis from food considering rapid onset, gastroenteritis.  EKG is first-degree AV block with PACs -Symptomatic management: Zofran as needed, oral hydration as able -OK for regular diet as tolerated -Check orthostatics in the  morning  Possible UTI Initial UA contaminated.  However does endorse dysuria.  History of E. coli with some resistance although has been cephalosporin sensitive in the past -Continue ceftriaxone 1 g IV every 24 hours - Added on urine culture  Acute myocardial injury No history of chest pain, does report history exertional dyspnea.  EKG without acute ischemic changes, note of inferior Q waves.  High-sensitivity troponin elevated at 88 , but downtrending to 85.  Not consistent with ACS.  Suspect possible demand event possible transient hypotension associated with her vagal episode.  -Management directed at possible predisposing episode per above  Chronic constipation, reported colonic mass Reports distal colonic mass although unable to describe further.  No findings on CT imaging.  Per her report ED provider did rectal exam noted possible hemorrhoids.  She has a history of remote colonoscopy last in 2006 with polypectomy.  Otherwise likely has some degree of opioid-induced constipation -Daily bisacodyl suppository, MiraLAX, senna -With her current goals of care, would not pursue sigmoidoscopy/colonoscopy at this point  Intermittent dysphagia -Pending goals per below can consider GI evaluation outpatient if desires  Goals of care Was on hospice care prior to this admission reportedly for COPD.  She desired observation in the hospital for issues above.  From discussion with me it sounds like that she is amenable to going back on hospice care following this admission. -Will need social work involvement re: hospice care reinitiation  Chronic medical problems: Hypertension: Continue home amlodipine Diabetes: Sliding scale while inpatient Chronic pain: Continue home Norco 4 times daily as needed   Body mass index is 19.84 kg/m.    DVT prophylaxis:  Lovenox Code Status:  DNR/DNI(Do NOT Intubate); confirmed with patient Diet:  Diet Orders (From admission, onward)     Start     Ordered    05/21/23 2332  Diet regular Room service appropriate? Yes; Fluid consistency: Thin  Diet effective now       Question Answer Comment  Room service appropriate? Yes   Fluid consistency: Thin      05/21/23 2335           Family Communication: No Consults: None Admission status:   Inpatient, Med-Surg  Severity of Illness: The appropriate patient status for this patient is INPATIENT. Inpatient status is judged to be reasonable and necessary in order to provide the required intensity of service to ensure the patient's safety. The patient's presenting symptoms, physical exam findings, and initial radiographic and laboratory data in the context of their chronic comorbidities is felt to place them at high risk for further clinical deterioration. Furthermore, it is not anticipated that the patient will be medically stable for discharge from the hospital within 2 midnights of admission.   * I certify that at the point of admission it is my clinical judgment that the patient will require inpatient hospital care spanning beyond 2 midnights from the point of admission due to high intensity of service, high risk for further deterioration and high frequency of surveillance required.*   Dolly Rias, MD [facility]  How to contact the [facility] Attending or Consulting provider 7A - 7P or covering provider during after hours 7P -7A, for this patient.  Check the care team in [facility] and look for a) attending/consulting [facility] provider listed and b) the [facility] team listed Log into [email or web address] and use [place]'s universal password to access. If you do not have the password, please contact the hospital operator. Locate the [facility] provider you are looking for under [facility] and page to a number that you can be directly reached. If you still have difficulty reaching the provider, please page the [facility] (Director on Call) for the Hospitalists listed on amion for assistance.  05/22/2023, 1:23 AM

## 2023-05-22 NOTE — Plan of Care (Signed)
  Problem: Coping: Goal: Ability to adjust to condition or change in health will improve Outcome: Progressing   Problem: Health Behavior/Discharge Planning: Goal: Ability to manage health-related needs will improve Outcome: Progressing   Problem: Nutritional: Goal: Maintenance of adequate nutrition will improve Outcome: Progressing   Problem: Skin Integrity: Goal: Risk for impaired skin integrity will decrease Outcome: Progressing   Problem: Education: Goal: Knowledge of General Education information will improve Description: Including pain rating scale, medication(s)/side effects and non-pharmacologic comfort measures Outcome: Progressing   Problem: Coping: Goal: Level of anxiety will decrease Outcome: Progressing   Problem: Pain Management: Goal: General experience of comfort will improve Outcome: Progressing   Problem: Safety: Goal: Ability to remain free from injury will improve Outcome: Progressing

## 2023-05-22 NOTE — Evaluation (Signed)
Physical Therapy Evaluation Patient Details Name: Alexa [name] MRN: [id number] DOB: 05/15/1930 Today's Date: 05/22/2023  History of Present Illness  89 y.o. female with hx of COPD, hypertension, diabetes, renal cell carcinoma with left-sided nephrectomy, spinal stenosis, chronic pain, recurrent UTI, who presents after acute onset of nausea and vomiting, presyncopal episode. Dx of possible UTI, presyncopal episode  Clinical Impression  Pt admitted with above diagnosis. Pt ambulated 50' with RW, distance limited by chronic arthritis pain "all over" and headache, pt denied dizziness with ambulation, no loss of balance. Pt lives alone, her family members that typically assist her with transportation and getting groceries (pt doesn't drive)  are having health issues and aren't available to provide assistance. Patient will benefit from continued inpatient follow up therapy, <3 hours/day  Pt currently with functional limitations due to the deficits listed below (see PT Problem List). Pt will benefit from acute skilled PT to increase their independence and safety with mobility to allow discharge.           If plan is discharge home, recommend the following: A little help with walking and/or transfers;A little help with bathing/dressing/bathroom;Assistance with cooking/housework;Assist for transportation;Help with stairs or ramp for entrance   Can travel by private vehicle   Yes    Equipment Recommendations None recommended by PT  Recommendations for Other Services       Functional Status Assessment Patient has had a recent decline in their functional status and demonstrates the ability to make significant improvements in function in a reasonable and predictable amount of time.     Precautions / Restrictions Precautions Precautions: Fall Precaution Comments: "I've probably had some falls, I can't remember". Restrictions Weight Bearing Restrictions: No      Mobility  Bed Mobility                General bed mobility comments: up on bedside commode    Transfers Overall transfer level: Needs assistance Equipment used: Rolling walker (2 wheels) Transfers: Sit to/from Stand Sit to Stand: Contact guard assist           General transfer comment: VCs hand placement.    Ambulation/Gait Ambulation/Gait assistance: Supervision Gait Distance (Feet): 50 Feet Assistive device: Rolling walker (2 wheels) Gait Pattern/deviations: Step-through pattern, Decreased stride length Gait velocity: decr     General Gait Details: steady, no loss of balance, distance limited by fatigue, pt denied dizziness with walking  Stairs            Wheelchair Mobility     Tilt Bed    Modified Rankin (Stroke Patients Only)       Balance Overall balance assessment: Needs assistance   Sitting balance-Leahy Scale: Good     Standing balance support: During functional activity, Reliant on assistive device for balance, Bilateral upper extremity supported Standing balance-Leahy Scale: Fair                               Pertinent Vitals/Pain Pain Assessment Pain Assessment: 0-10 Pain Score: 8  Pain Location: arthritis pain "all over" and headache Pain Descriptors / Indicators: Aching Pain Intervention(s): Limited activity within patient's tolerance, Monitored during session, Premedicated before session, Repositioned    Home Living Family/patient expects to be discharged to:: Private residence Living Arrangements: Alone   Type of Home: House Home Access: Stairs to enter   Secretary/administrator of Steps: 5   Home Layout: One level Home Equipment: Tub bench;Rollator (4 wheels);BSC/3in1 Additional  Comments: doesn't drive, brother, friends, and grandson help with groceries and appointments    Prior Function Prior Level of Function : Independent/Modified Independent             Mobility Comments: Ambulatory wtih rollator ADLs Comments: Does not drive;  reports typically fixes easy meals and does her own adls; has a lady that helps 3 days a week with higher level iadls     Extremity/Trunk Assessment   Upper Extremity Assessment Upper Extremity Assessment: Overall WFL for tasks assessed    Lower Extremity Assessment Lower Extremity Assessment: Overall WFL for tasks assessed;RLE deficits/detail RLE Deficits / Details: "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 Genu valgus noted.  knee ext 5/5, ankle DF AROM ~0*. RLE Sensation: WNL RLE Coordination: WNL    Cervical / Trunk Assessment Cervical / Trunk Assessment: Normal  Communication   Communication Communication: No apparent difficulties  Cognition Arousal: Alert Behavior During Therapy: WFL for tasks assessed/performed Overall Cognitive Status: Impaired/Different from baseline Area of Impairment: Memory                     Memory: Decreased short-term memory         General Comments: "this infection is affecting my memory, I've probably had some falls at home but I can't remember". Pt able to follow commands, is alert and oriented x 4.        General Comments      Exercises     Assessment/Plan    PT Assessment Patient needs continued PT services  PT Problem List Decreased activity tolerance;Decreased mobility       PT Treatment Interventions Gait training;Therapeutic exercise;Functional mobility training;Therapeutic activities;Patient/family education;Balance training    PT Goals (Current goals can be found in the Care Plan section)  Acute Rehab PT Goals Patient Stated Goal: to go to rehab then back home PT Goal Formulation: With patient Time For Goal Achievement: 06/05/23 Potential to Achieve Goals: Good    Frequency Min 1X/week     Co-evaluation               AM-PAC PT "6 Clicks" Mobility  Outcome Measure Help needed turning from your back to your side while in a flat bed without using bedrails?: A Little Help needed moving from lying on your back  to sitting on the side of a flat bed without using bedrails?: A Little Help needed moving to and from a bed to a chair (including a wheelchair)?: A Little Help needed standing up from a chair using your arms (e.g., wheelchair or bedside chair)?: A Little Help needed to walk in hospital room?: A Little Help needed climbing 3-5 steps with a railing? : A Little 6 Click Score: 18    End of Session Equipment Utilized During Treatment: Gait belt Activity Tolerance: Patient tolerated treatment well Patient left: in chair;with chair alarm set;with call bell/phone within reach Nurse Communication: Mobility status PT Visit Diagnosis: Difficulty in walking, not elsewhere classified (R26.2);Pain    Time: 4332-9518 PT Time Calculation (min) (ACUTE ONLY): 29 min   Charges:   PT Evaluation $PT Eval Moderate Complexity: 1 Mod PT Treatments $Gait Training: 8-22 mins PT General Charges $$ ACUTE PT VISIT: 1 Visit        Tamala Ser PT 05/22/2023  Acute Rehabilitation Services  Office [phone]

## 2023-05-22 NOTE — Progress Notes (Signed)
[facility] PROGRESS NOTE    Progress Note  Alexa [name]  PIR:[id number] DOB: 17-May-1930 DOA: 05/21/2023 PCP: Charlane Ferretti, DO     Brief Narrative:   Alexa [name] is an 89 y.o. female past medical history of COPD, essential hypertension renal cell carcinoma with left-sided nephrectomy, spinal stenosis, recurrent UTIs comes in for acute nausea vomiting and presyncopal episode, associated with suprapubic abdominal pain diaphoresis.  Assessment/Plan:  Near syncopal, nausea vomiting and suprapubic pain: Possibly vasovagal as this was after straining to have a bowel movement, cardiac biomarkers have been flat. Twelve-lead EKG showed first-degree AV block with PACs. Continue Zofran with symptomatic management she was allowed diet.  Possibly due to urinary tract infection: Show more than 50 white blood cells and positive nitrates started empirically on Rocephin urine cultures have been sent.  Elevated troponin Cardiac biomarkers are basically remained flat she denies any chest pain or shortness of breath. Twelve-lead EKG showed no evidence of ischemia.  Chronic constipation: CT scan of the abdomen pelvis showed diverticulosis without diverticulitis status post nephrectomy. Continue MiraLAX p.o. twice daily and sorbitol once. Likely due to narcotics.  Stage 3a chronic kidney disease (CKD) (HCC) Creatinine has remained baseline.  Goals of care: Will go back with hospice at home.  DVT prophylaxis: lovenox Family Communication:none Status is: Observation The patient remains OBS appropriate and will d/c before 2 midnights.    Code Status:     Code Status Orders  (From admission, onward)           Start     Ordered   05/21/23 2332  Do not attempt resuscitation (DNR)- Limited -Do Not Intubate (DNI)  Continuous       Question Answer Comment  If pulseless and not breathing No CPR or chest compressions.   In Pre-Arrest Conditions (Patient Is Breathing and Has A  Pulse) Do not intubate. Provide all appropriate non-invasive medical interventions. Avoid ICU transfer unless indicated or required.   Consent: Discussion documented in EHR or advanced directives reviewed      05/21/23 2335           Code Status History     Date Active Date Inactive Code Status Order ID Comments User Context   02/13/2023 2059 02/17/2023 1722 DNR [id number]  Charlsie Quest, MD ED   12/15/2021 0554 12/20/2021 0021 DNR [id number]  Briscoe Deutscher, MD Inpatient   11/22/2014 1451 11/23/2014 1452 Full Code [id number]  Conley Canal Inpatient   02/05/2012 1623 02/07/2012 1406 Full Code [id number]  Vernie Shanks, RN Inpatient      Advance Directive Documentation    Flowsheet Row Most Recent Value  Type of Advance Directive Out of facility DNR (pink MOST or yellow form)  Pre-existing out of facility DNR order (yellow form or pink MOST form) --  "MOST" Form in Place? --         IV Access:   Peripheral IV   Procedures and diagnostic studies:   CT ABDOMEN PELVIS W CONTRAST  Result Date: 05/21/2023 CLINICAL DATA:  Acute abdominal pain EXAM: CT ABDOMEN AND PELVIS WITH CONTRAST TECHNIQUE: Multidetector CT imaging of the abdomen and pelvis was performed using the standard protocol following bolus administration of intravenous contrast. RADIATION DOSE REDUCTION: This exam was performed according to the departmental dose-optimization program which includes automated exposure control, adjustment of the mA and/or kV according to patient size and/or use of iterative reconstruction technique. CONTRAST:  OMNIPAQUE IOHEXOL 300 MG/ML  SOLN COMPARISON:  10/18/2022  FINDINGS: Lower chest: Mild bronchial wall thickening and bronchiectasis is noted in the bases bilaterally stable from the prior exam. Hepatobiliary: No focal liver abnormality is seen. No gallstones, gallbladder wall thickening, or biliary dilatation. Pancreas: Unremarkable. No pancreatic ductal dilatation or  surrounding inflammatory changes. Spleen: Normal in size without focal abnormality. Adrenals/Urinary Tract: Adrenal glands are within normal limits. Changes of prior left nephrectomy are noted. Right kidney is well visualized and within normal limits. No obstructive changes are seen. Bladder is partially distended. Stomach/Bowel: Scattered diverticular changes noted without evidence of diverticulitis. No obstructive or inflammatory changes are seen. The appendix is within normal limits. Small bowel and stomach are unremarkable. Vascular/Lymphatic: Aortic atherosclerosis. No enlarged abdominal or pelvic lymph nodes. Reproductive: Uterus and bilateral adnexa are unremarkable. Other: No free fluid noted.  No focal herniation is seen. Musculoskeletal: Right hip replacement is noted. Degenerative changes of the thoracic spine are seen. No acute abnormality is noted. Chronic T12 compression fracture is seen. IMPRESSION: Diverticulosis without diverticulitis. Status post left nephrectomy. No acute abnormality noted. Electronically Signed   By: Alcide Clever M.D.   On: 05/21/2023 21:40     Medical Consultants:   None.   Subjective:    Alexa [name] still having suprapubic pain has not had a bowel movement  Objective:    Vitals:   05/22/23 0025 05/22/23 0030 05/22/23 0102 05/22/23 0546  BP: (!) 174/100  (!) 182/89 (!) 166/92  Pulse: 81 82 90 82  Resp:  19 18 18   Temp:   98.1 F (36.7 C) 98 F (36.7 C)  TempSrc:   Oral Oral  SpO2: 93% 94% 98% 94%  Weight:   49.2 kg   Height:   5\' 2"  (1.575 m)    SpO2: 94 %   Intake/Output Summary (Last 24 hours) at 05/22/2023 0837 Last data filed at 05/22/2023 0100 Gross per 24 hour  Intake 80 ml  Output 50 ml  Net 30 ml   Filed Weights   05/22/23 0102  Weight: 49.2 kg    Exam: General exam: In no acute distress. Respiratory system: Good air movement and clear to auscultation. Cardiovascular system: S1 & S2 heard, RRR. No JVD. Gastrointestinal  system: Abdomen is nondistended, soft and nontender.  Extremities: No pedal edema. Skin: No rashes, lesions or ulcers Psychiatry: Judgement and insight appear normal. Mood & affect appropriate.    Data Reviewed:    Labs: Basic Metabolic Panel: Recent Labs  Lab 05/21/23 1723 05/22/23 0438  NA 140 139  K 3.7 3.7  CL 105 105  CO2 25 26  GLUCOSE 181* 122*  BUN 23 16  CREATININE 0.80 0.62  CALCIUM 9.0 8.9  MG  --  2.1  PHOS  --  3.1   GFR Estimated Creatinine Clearance: 35.6 mL/min (by C-G formula based on SCr of 0.62 mg/dL). Liver Function Tests: Recent Labs  Lab 05/21/23 1723  AST 22  ALT 16  ALKPHOS 67  BILITOT 0.7  PROT 7.5  ALBUMIN 4.1   Recent Labs  Lab 05/21/23 1723  LIPASE 70*   No results for input(s): "AMMONIA" in the last 168 hours. Coagulation profile No results for input(s): "INR", "PROTIME" in the last 168 hours. COVID-19 Labs  No results for input(s): "DDIMER", "FERRITIN", "LDH", "CRP" in the last 72 hours.  Lab Results  Component Value Date   SARSCOV2NAA NEGATIVE 05/21/2023   SARSCOV2NAA NEGATIVE 02/13/2023   SARSCOV2NAA NEGATIVE 11/17/2019    CBC: Recent Labs  Lab 05/21/23 1723 05/22/23 0438  WBC 9.2 10.1  NEUTROABS 7.3  --   HGB 15.6* 14.1  HCT 48.2* 42.4  MCV 92.0 91.0  PLT 281 271   Cardiac Enzymes: No results for input(s): "CKTOTAL", "CKMB", "CKMBINDEX", "TROPONINI" in the last 168 hours. BNP (last 3 results) No results for input(s): "PROBNP" in the last 8760 hours. CBG: Recent Labs  Lab 05/22/23 0107 05/22/23 0803  GLUCAP 132* 108*   D-Dimer: No results for input(s): "DDIMER" in the last 72 hours. Hgb A1c: No results for input(s): "HGBA1C" in the last 72 hours. Lipid Profile: No results for input(s): "CHOL", "HDL", "LDLCALC", "TRIG", "CHOLHDL", "LDLDIRECT" in the last 72 hours. Thyroid function studies: No results for input(s): "TSH", "T4TOTAL", "T3FREE", "THYROIDAB" in the last 72 hours.  Invalid input(s):  "FREET3" Anemia work up: No results for input(s): "VITAMINB12", "FOLATE", "FERRITIN", "TIBC", "IRON", "RETICCTPCT" in the last 72 hours. Sepsis Labs: Recent Labs  Lab 05/21/23 1723 05/22/23 0438  WBC 9.2 10.1   Microbiology Recent Results (from the past 240 hour(s))  SARS Coronavirus 2 by RT PCR (hospital order, performed in [facility] hospital lab) *cepheid single result test* Anterior Nasal Swab     Status: None   Collection Time: 05/21/23  5:23 PM   Specimen: Anterior Nasal Swab  Result Value Ref Range Status   SARS Coronavirus 2 by RT PCR NEGATIVE NEGATIVE Final    Comment: (NOTE) SARS-CoV-2 target nucleic acids are NOT DETECTED.  The SARS-CoV-2 RNA is generally detectable in upper and lower respiratory specimens during the acute phase of infection. The lowest concentration of SARS-CoV-2 viral copies this assay can detect is 250 copies / mL. A negative result does not preclude SARS-CoV-2 infection and should not be used as the sole basis for treatment or other patient management decisions.  A negative result may occur with improper specimen collection / handling, submission of specimen other than nasopharyngeal swab, presence of viral mutation(s) within the areas targeted by this assay, and inadequate number of viral copies (<250 copies / mL). A negative result must be combined with clinical observations, patient history, and epidemiological information.  Fact Sheet for Patients:   RoadLapTop.co.za  Fact Sheet for Healthcare Providers: http://kim-miller.com/  This test is not yet approved or  cleared by the [place] FDA and has been authorized for detection and/or diagnosis of SARS-CoV-2 by FDA under an Emergency Use Authorization (EUA).  This EUA will remain in effect (meaning this test can be used) for the duration of the COVID-19 declaration under Section 564(b)(1) of the Act, 21 U.S.C. section 360bbb-3(b)(1), unless  the authorization is terminated or revoked sooner.  Performed at [facility], [address] [place], [place] [address]      Medications:    amLODipine  5 mg Oral Daily   bisacodyl  10 mg Rectal Daily   enoxaparin (LOVENOX) injection  40 mg Subcutaneous Q24H   insulin aspart  0-6 Units Subcutaneous TID WC   polyethylene glycol  17 g Oral Daily   senna-docusate  1 tablet Oral QHS   Continuous Infusions:  cefTRIAXone (ROCEPHIN)  IV        LOS: 0 days   Marinda Elk  [facility]  05/22/2023, 8:37 AM

## 2023-05-23 DIAGNOSIS — N3000 Acute cystitis without hematuria: Secondary | ICD-10-CM | POA: Diagnosis not present

## 2023-05-23 LAB — GLUCOSE, CAPILLARY
Glucose-Capillary: 115 mg/dL — ABNORMAL HIGH (ref 70–99)
Glucose-Capillary: 158 mg/dL — ABNORMAL HIGH (ref 70–99)
Glucose-Capillary: 115 mg/dL — ABNORMAL HIGH (ref 70–99)

## 2023-05-23 MED ORDER — FLEET ENEMA RE ENEM
1.0000 | ENEMA | Freq: Every day | RECTAL | Status: AC | PRN
  Administered 2023-05-23: 1 via RECTAL
  Filled 2023-05-23: qty 1

## 2023-05-23 MED ORDER — HYDRALAZINE HCL 10 MG PO TABS
10.0000 mg | ORAL_TABLET | Freq: Four times a day (QID) | ORAL | Status: DC
  Administered 2023-05-23 – 2023-05-27 (×16): 10 mg via ORAL
  Filled 2023-05-23 (×17): qty 1

## 2023-05-23 MED ORDER — SORBITOL 70 % SOLN
60.0000 mL | Freq: Once | Status: AC
  Administered 2023-05-23: 60 mL via ORAL
  Filled 2023-05-23: qty 60

## 2023-05-23 NOTE — NC FL2 (Signed)
[place] MEDICAID FL2 LEVEL OF CARE FORM     IDENTIFICATION  Patient Name: Alexa [name] Birthdate: 17-Dec-1929 Sex: female Admission Date (Current Location): 05/21/2023  [facility] and [place] Number:  Producer, television/film/video and Address:  [facility],  [address] [place], [place] [address]      Provider Number: [id number]  Attending Physician Name and Address:  David Stall, Darin Engels, MD  Relative Name and Phone Number:  Tamekia Potosky (son) [phone]    Current Level of Care: Hospital Recommended Level of Care: Skilled Nursing Facility Prior Approval Number:    Date Approved/Denied:   PASRR Number: [id number]  Discharge Plan: SNF    Current Diagnoses: Patient Active Problem List   Diagnosis Date Noted   Near syncope 05/22/2023   Constipation 05/22/2023   UTI (urinary tract infection) 02/14/2023   Urinary tract infection 02/13/2023   COPD (chronic obstructive pulmonary disease) (HCC) 02/13/2023   Generalized weakness 02/13/2023   Acute postoperative anemia due to expected blood loss 12/17/2021   Closed right hip fracture (HCC) 12/15/2021   Stage 3a chronic kidney disease (CKD) (HCC) 12/15/2021   Acute urinary retention 12/15/2021   Acute cystitis without hematuria    Elevated troponin    Allergy to alpha-gal 12/18/2020   Other adverse food reactions, not elsewhere classified, subsequent encounter 12/18/2020   Rash and other nonspecific skin eruption 11/21/2020   Seasonal and perennial allergic rhinitis 11/21/2020   Solitary kidney 12/15/2018   Cough variant asthma 12/29/2014   Spinal stenosis in cervical region 12/13/2014   Radiculopathy, lumbar region 12/13/2014   Depression 05/03/2008   PERONEAL NEUROPATHY 05/03/2008   Malignant neoplasm of kidney excluding renal pelvis (HCC) 08/06/2007   Controlled type 2 diabetes mellitus without complication, without long-term current use of insulin (HCC) 08/06/2007   GERD 08/06/2007   PULMONARY EMBOLISM, HX OF  08/06/2007   Hyperlipidemia 09/03/2006   Essential hypertension 09/03/2006   IBS 09/03/2006    Orientation RESPIRATION BLADDER Height & Weight     Self, Time, Situation, Place  Normal Continent Weight: 49.2 kg Height:  5\' 2"  (157.5 cm)  BEHAVIORAL SYMPTOMS/MOOD NEUROLOGICAL BOWEL NUTRITION STATUS      Continent Diet (Regular)  AMBULATORY STATUS COMMUNICATION OF NEEDS Skin   Limited Assist Verbally Normal                       Personal Care Assistance Level of Assistance  Bathing, Feeding, Dressing Bathing Assistance: Limited assistance Feeding assistance: Independent Dressing Assistance: Limited assistance     Functional Limitations Info  Sight, Hearing, Speech Sight Info: Impaired (glasses) Hearing Info: Adequate Speech Info: Adequate    SPECIAL CARE FACTORS FREQUENCY  PT (By licensed PT), OT (By licensed OT)     PT Frequency: 5x/week OT Frequency: 5x/week            Contractures Contractures Info: Not present    Additional Factors Info  Code Status, Allergies, Insulin Sliding Scale Code Status Info: Limited: Do not attempt resuscitation (DNR) -DNR-LIMITED -Do Not Intubate/DN Allergies Info: Lisinopril, Codeine, Verapamil   Insulin Sliding Scale Info: See MAR       Current Medications (05/23/2023):  This is the current hospital active medication list Current Facility-Administered Medications  Medication Dose Route Frequency Provider Last Rate Last Admin   albuterol (PROVENTIL) (2.5 MG/3ML) 0.083% nebulizer solution 2.5 mg  2.5 mg Nebulization Q4H PRN Segars, Christiane Ha, MD       amLODipine (NORVASC) tablet 10 mg  10 mg Oral  Daily Marinda Elk, MD   10 mg at 05/23/23 0846   bisacodyl (DULCOLAX) suppository 10 mg  10 mg Rectal Daily Dolly Rias, MD   10 mg at 05/23/23 0846   cefTRIAXone (ROCEPHIN) 1 g in sodium chloride 0.9 % 100 mL IVPB  1 g Intravenous Q24H Dolly Rias, MD 200 mL/hr at 05/22/23 2229 1 g at 05/22/23 2229   enoxaparin  (LOVENOX) injection 40 mg  40 mg Subcutaneous Q24H Dolly Rias, MD   40 mg at 05/23/23 0846   hydrALAZINE (APRESOLINE) tablet 10 mg  10 mg Oral Q6H PRN Marinda Elk, MD       hydrALAZINE (APRESOLINE) tablet 10 mg  10 mg Oral Q6H Marinda Elk, MD   10 mg at 05/23/23 1204   HYDROcodone-acetaminophen (NORCO/VICODIN) 5-325 MG per tablet 1 tablet  1 tablet Oral Q6H PRN Dolly Rias, MD   1 tablet at 05/23/23 1046   insulin aspart (novoLOG) injection 0-6 Units  0-6 Units Subcutaneous TID WC Dolly Rias, MD   1 Units at 05/23/23 1204   ondansetron (ZOFRAN) tablet 4 mg  4 mg Oral Q6H PRN Dolly Rias, MD       Or   ondansetron (ZOFRAN) injection 4 mg  4 mg Intravenous Q6H PRN Segars, Christiane Ha, MD       polyethylene glycol (MIRALAX / GLYCOLAX) packet 17 g  17 g Oral BID Marinda Elk, MD   17 g at 05/23/23 0846   senna-docusate (Senokot-S) tablet 1 tablet  1 tablet Oral Lorenza Chick, MD   1 tablet at 05/22/23 2221     Discharge Medications: Please see discharge summary for a list of discharge medications.  Relevant Imaging Results:  Relevant Lab Results:   Additional Information SSN [id number]  Adrian Prows, RN

## 2023-05-23 NOTE — TOC Progression Note (Signed)
Transition of Care [facility]) - Progression Note    Patient Details  Name: Alexa [name] MRN: [id number] Date of Birth: 03-11-1930  Transition of Care [facility]) CM/SW Contact  Adrian Prows, RN Phone Number: 05/23/2023, 3:56 PM  Clinical Narrative:    Patient uses [facility]; spoke w/ Morrie Sheldon at agency [phone]); she says pt was discharged from agency on 05/21/23.    Expected Discharge Plan: Skilled Nursing Facility Barriers to Discharge: Continued Medical Work up  Expected Discharge Plan and Services   Discharge Planning Services: CM Consult Post Acute Care Choice: Skilled Nursing Facility Living arrangements for the past 2 months: Single Family Home                                       Social Determinants of Health (SDOH) Interventions SDOH Screenings   Food Insecurity: No Food Insecurity (05/23/2023)  Housing: Patient Declined (05/23/2023)  Transportation Needs: No Transportation Needs (05/23/2023)  Utilities: Not At Risk (05/23/2023)  Alcohol Screen: Low Risk  (04/03/2018)  Depression (PHQ2-9): Low Risk  (04/07/2020)  Financial Resource Strain: Low Risk  (08/08/2020)  Tobacco Use: Low Risk  (05/22/2023)    Readmission Risk Interventions    05/23/2023   11:53 AM 02/17/2023   10:42 AM 12/17/2021    1:08 PM  Readmission Risk Prevention Plan  Post Dischage Appt  Complete   Medication Screening  Complete   Transportation Screening Complete Complete Complete  PCP or Specialist Appt within 5-7 Days Complete  Complete  Home Care Screening Complete  Complete  Medication Review (RN CM) Complete  Complete

## 2023-05-23 NOTE — TOC Initial Note (Signed)
Transition of Care [facility]) - Initial/Assessment Note    Patient Details  Name: Alexa [name] MRN: [id number] Date of Birth: Jul 22, 1930  Transition of Care [facility]) CM/SW Contact:    Adrian Prows, RN Phone Number: 05/23/2023, 11:57 AM  Clinical Narrative:                 TOC for d/c planning; PT recc SNF; spoke w/ pt in room; pt says she lives at home alone; she agrees to SNF; pt identified POC Ceola Hugley (son) [phone]; she denies SDOH risks; pt says she has no one to transport her home because her son and brother are sick; she has glasses and upper partial; pt says she has a Museum/gallery exhibitions officer, cane, shower chair, BSC, and shower chair; she has grab bars in her shower; pt says she does not have home oxygen; patient says she has an Charity fundraiser from hospice that checks on her weekly; she is not sure of the name of the agency; pt agrees to SNF; explained SNF process; she would like the bed search limited to [facility]; her facility of choice is [facility]; pt says she can feed herself; she continent of bowel and bladder; pt says she has no issues with her skin; will initiate SNF process; awaiting bed offers. Expected Discharge Plan: Skilled Nursing Facility Barriers to Discharge: Continued Medical Work up   Patient Goals and CMS Choice Patient states their goals for this hospitalization and ongoing recovery are:: going to SNF for rehab CMS Medicare.gov Compare Post Acute Care list provided to:: Patient Choice offered to / list presented to : Patient [place] ownership interest in [facility].provided to:: Patient    Expected Discharge Plan and Services   Discharge Planning Services: CM Consult Post Acute Care Choice: Skilled Nursing Facility Living arrangements for the past 2 months: Single Family Home                                      Prior Living Arrangements/Services Living arrangements for the past 2 months: Single Family Home Lives with:: Self Patient  language and need for interpreter reviewed:: Yes Do you feel safe going back to the place where you live?: Yes      Need for Family Participation in Patient Care: Yes (Comment) Care giver support system in place?: Yes (comment) Current home services: DME (cane, Rolator, BSC, shower chair) Criminal Activity/Legal Involvement Pertinent to Current Situation/Hospitalization: No - Comment as needed  Activities of Daily Living   ADL Screening (condition at time of admission) Independently performs ADLs?: Yes (appropriate for developmental age) Is the patient deaf or have difficulty hearing?: No Does the patient have difficulty seeing, even when wearing glasses/contacts?: No Does the patient have difficulty concentrating, remembering, or making decisions?: No  Permission Sought/Granted Permission sought to share information with : Case Manager Permission granted to share information with : Yes, Verbal Permission Granted  Share Information with NAME: Case Manager     Permission granted to share info w Relationship: Varetta Browell (son) [phone]     Emotional Assessment Appearance:: Appears stated age Attitude/Demeanor/Rapport: Gracious Affect (typically observed): Accepting Orientation: : Oriented to Self, Oriented to Place, Oriented to  Time, Oriented to Situation Alcohol / Substance Use: Not Applicable Psych Involvement: No (comment)  Admission diagnosis:  Urinary tract infection [N39.0] Elevated troponin [R79.89] Acute cystitis without hematuria [N30.00] Nausea and vomiting, unspecified vomiting type [R11.2] UTI (urinary  tract infection) [N39.0] Patient Active Problem List   Diagnosis Date Noted   Near syncope 05/22/2023   Constipation 05/22/2023   UTI (urinary tract infection) 02/14/2023   Urinary tract infection 02/13/2023   COPD (chronic obstructive pulmonary disease) (HCC) 02/13/2023   Generalized weakness 02/13/2023   Acute postoperative anemia due to expected blood loss  12/17/2021   Closed right hip fracture (HCC) 12/15/2021   Stage 3a chronic kidney disease (CKD) (HCC) 12/15/2021   Acute urinary retention 12/15/2021   Acute cystitis without hematuria    Elevated troponin    Allergy to alpha-gal 12/18/2020   Other adverse food reactions, not elsewhere classified, subsequent encounter 12/18/2020   Rash and other nonspecific skin eruption 11/21/2020   Seasonal and perennial allergic rhinitis 11/21/2020   Solitary kidney 12/15/2018   Cough variant asthma 12/29/2014   Spinal stenosis in cervical region 12/13/2014   Radiculopathy, lumbar region 12/13/2014   Depression 05/03/2008   PERONEAL NEUROPATHY 05/03/2008   Malignant neoplasm of kidney excluding renal pelvis (HCC) 08/06/2007   Controlled type 2 diabetes mellitus without complication, without long-term current use of insulin (HCC) 08/06/2007   GERD 08/06/2007   PULMONARY EMBOLISM, HX OF 08/06/2007   Hyperlipidemia 09/03/2006   Essential hypertension 09/03/2006   IBS 09/03/2006   PCP:  Charlane Ferretti, DO Pharmacy:   [facility] [id number] - [place], [place] - [address] AT [address] [place] [address] Phone: [phone] Fax: [phone]  [facility] - [place], [place] - [address] [place] [address] Phone: [phone] Fax: [phone]  [facility] #[id number] [place], [place] - [address] AT [place] [facility] & [address] [place] [address] Phone: [phone] Fax: [phone]     Social Determinants of Health (SDOH) Social History: SDOH Screenings   Food Insecurity: No Food Insecurity (05/23/2023)  Housing: Patient Declined (05/23/2023)  Transportation Needs: No Transportation Needs (05/23/2023)  Utilities: Not At Risk (05/23/2023)  Alcohol Screen: Low Risk  (04/03/2018)  Depression (PHQ2-9): Low Risk  (04/07/2020)  Financial Resource Strain: Low Risk   (08/08/2020)  Tobacco Use: Low Risk  (05/22/2023)   SDOH Interventions: Food Insecurity Interventions: Intervention Not Indicated, Inpatient TOC Housing Interventions: Intervention Not Indicated, Inpatient TOC Transportation Interventions: Intervention Not Indicated, Inpatient TOC Utilities Interventions: Intervention Not Indicated, Inpatient TOC   Readmission Risk Interventions    05/23/2023   11:53 AM 02/17/2023   10:42 AM 12/17/2021    1:08 PM  Readmission Risk Prevention Plan  Post Dischage Appt  Complete   Medication Screening  Complete   Transportation Screening Complete Complete Complete  PCP or Specialist Appt within 5-7 Days Complete  Complete  Home Care Screening Complete  Complete  Medication Review (RN CM) Complete  Complete

## 2023-05-23 NOTE — Progress Notes (Signed)
Mobility Specialist - Progress Note   05/23/23 1015  Mobility  Activity Ambulated with assistance in hallway  Level of Assistance Contact guard assist, steadying assist  Assistive Device Front wheel walker  Distance Ambulated (ft) 120 ft  Activity Response Tolerated well  Mobility Referral Yes  $Mobility charge 1 Mobility  Mobility Specialist Start Time (ACUTE ONLY) [id number]  Mobility Specialist Stop Time (ACUTE ONLY) 1014  Mobility Specialist Time Calculation (min) (ACUTE ONLY) 16 min   Pt received in bed and agreeable to mobility. Pt was minA from STS & CG during session. Upon returning to room, pt requested assistance to [facility] for BM. Instructed pt to pull call bell when finished. RN made aware.   [facility]

## 2023-05-23 NOTE — Progress Notes (Signed)
[facility] PROGRESS NOTE    Progress Note  Alexa [name]  XBM:[id number] DOB: 10-Sep-1929 DOA: 05/21/2023 PCP: Charlane Ferretti, DO     Brief Narrative:   Alexa [name] is an 89 y.o. female past medical history of COPD, essential hypertension renal cell carcinoma with left-sided nephrectomy, spinal stenosis, recurrent UTIs comes in for acute nausea vomiting and presyncopal episode, associated with suprapubic abdominal pain diaphoresis.  Assessment/Plan:  Near syncopal, nausea vomiting and suprapubic pain: Possibly vasovagal as this was after straining to have a bowel movement, cardiac biomarkers have been flat. Twelve-lead EKG showed first-degree AV block with PACs. Continue Zofran with symptomatic management she was allowed diet.  Possibly due to urinary tract infection: Show more than 50 white blood cells and positive nitrates started empirically on Rocephin, Urine cultures have been negative till date.  Elevated troponin Cardiac biomarkers are basically remained flat she denies any chest pain or shortness of breath. Twelve-lead EKG showed no evidence of ischemia.  Essential hypertension: Blood pressure significantly elevated and Norvasc was increased, will start on low-dose hydralazine.  Chronic constipation: CT scan of the abdomen pelvis showed diverticulosis without diverticulitis status post nephrectomy. Continue relaxant sorbitol has not had a bowel movement.  Stage 3a chronic kidney disease (CKD) (HCC) Creatinine has remained baseline.  Goals of care: Will go back with hospice at home.  DVT prophylaxis: lovenox Family Communication:none Status is: Observation The patient remains OBS appropriate and will d/c before 2 midnights.    Code Status:     Code Status Orders  (From admission, onward)           Start     Ordered   05/21/23 2332  Do not attempt resuscitation (DNR)- Limited -Do Not Intubate (DNI)  Continuous       Question Answer Comment   If pulseless and not breathing No CPR or chest compressions.   In Pre-Arrest Conditions (Patient Is Breathing and Has A Pulse) Do not intubate. Provide all appropriate non-invasive medical interventions. Avoid ICU transfer unless indicated or required.   Consent: Discussion documented in EHR or advanced directives reviewed      05/21/23 2335           Code Status History     Date Active Date Inactive Code Status Order ID Comments User Context   02/13/2023 2059 02/17/2023 1722 DNR [id number]  Charlsie Quest, MD ED   12/15/2021 0554 12/20/2021 0021 DNR [id number]  Briscoe Deutscher, MD Inpatient   11/22/2014 1451 11/23/2014 1452 Full Code [id number]  Conley Canal Inpatient   02/05/2012 1623 02/07/2012 1406 Full Code [id number]  Vernie Shanks, RN Inpatient      Advance Directive Documentation    Flowsheet Row Most Recent Value  Type of Advance Directive Out of facility DNR (pink MOST or yellow form)  Pre-existing out of facility DNR order (yellow form or pink MOST form) --  "MOST" Form in Place? --         IV Access:   Peripheral IV   Procedures and diagnostic studies:   CT ABDOMEN PELVIS W CONTRAST  Result Date: 05/21/2023 CLINICAL DATA:  Acute abdominal pain EXAM: CT ABDOMEN AND PELVIS WITH CONTRAST TECHNIQUE: Multidetector CT imaging of the abdomen and pelvis was performed using the standard protocol following bolus administration of intravenous contrast. RADIATION DOSE REDUCTION: This exam was performed according to the departmental dose-optimization program which includes automated exposure control, adjustment of the mA and/or kV according to patient size and/or use of  iterative reconstruction technique. CONTRAST:  OMNIPAQUE IOHEXOL 300 MG/ML  SOLN COMPARISON:  10/18/2022 FINDINGS: Lower chest: Mild bronchial wall thickening and bronchiectasis is noted in the bases bilaterally stable from the prior exam. Hepatobiliary: No focal liver abnormality is seen. No  gallstones, gallbladder wall thickening, or biliary dilatation. Pancreas: Unremarkable. No pancreatic ductal dilatation or surrounding inflammatory changes. Spleen: Normal in size without focal abnormality. Adrenals/Urinary Tract: Adrenal glands are within normal limits. Changes of prior left nephrectomy are noted. Right kidney is well visualized and within normal limits. No obstructive changes are seen. Bladder is partially distended. Stomach/Bowel: Scattered diverticular changes noted without evidence of diverticulitis. No obstructive or inflammatory changes are seen. The appendix is within normal limits. Small bowel and stomach are unremarkable. Vascular/Lymphatic: Aortic atherosclerosis. No enlarged abdominal or pelvic lymph nodes. Reproductive: Uterus and bilateral adnexa are unremarkable. Other: No free fluid noted.  No focal herniation is seen. Musculoskeletal: Right hip replacement is noted. Degenerative changes of the thoracic spine are seen. No acute abnormality is noted. Chronic T12 compression fracture is seen. IMPRESSION: Diverticulosis without diverticulitis. Status post left nephrectomy. No acute abnormality noted. Electronically Signed   By: Alcide Clever M.D.   On: 05/21/2023 21:40     Medical Consultants:   None.   Subjective:    Alexa [name] feels better but not had a bowel movement.  Objective:    Vitals:   05/22/23 0910 05/22/23 1334 05/22/23 2150 05/23/23 0608  BP: (!) 166/92 (!) 151/84 (!) 153/111 (!) 174/106  Pulse:  91 100 71  Resp:  16 18 18   Temp:  98.3 F (36.8 C) 98.5 F (36.9 C) 97.9 F (36.6 C)  TempSrc:  Oral Oral Oral  SpO2: 92% 91% 93% 97%  Weight:      Height:       SpO2: 97 %   Intake/Output Summary (Last 24 hours) at 05/23/2023 0942 Last data filed at 05/23/2023 9147 Gross per 24 hour  Intake 700 ml  Output 300 ml  Net 400 ml   Filed Weights   05/22/23 0102  Weight: 49.2 kg    Exam: General exam: In no acute distress. Respiratory  system: Good air movement and clear to auscultation. Cardiovascular system: S1 & S2 heard, RRR. No JVD. Gastrointestinal system: Abdomen is nondistended, soft and nontender.  Extremities: No pedal edema. Skin: No rashes, lesions or ulcers Psychiatry: Judgement and insight appear normal. Mood & affect appropriate.  Data Reviewed:    Labs: Basic Metabolic Panel: Recent Labs  Lab 05/21/23 1723 05/22/23 0438  NA 140 139  K 3.7 3.7  CL 105 105  CO2 25 26  GLUCOSE 181* 122*  BUN 23 16  CREATININE 0.80 0.62  CALCIUM 9.0 8.9  MG  --  2.1  PHOS  --  3.1   GFR Estimated Creatinine Clearance: 35.6 mL/min (by C-G formula based on SCr of 0.62 mg/dL). Liver Function Tests: Recent Labs  Lab 05/21/23 1723  AST 22  ALT 16  ALKPHOS 67  BILITOT 0.7  PROT 7.5  ALBUMIN 4.1   Recent Labs  Lab 05/21/23 1723  LIPASE 70*   No results for input(s): "AMMONIA" in the last 168 hours. Coagulation profile No results for input(s): "INR", "PROTIME" in the last 168 hours. COVID-19 Labs  No results for input(s): "DDIMER", "FERRITIN", "LDH", "CRP" in the last 72 hours.  Lab Results  Component Value Date   SARSCOV2NAA NEGATIVE 05/21/2023   SARSCOV2NAA NEGATIVE 02/13/2023   SARSCOV2NAA NEGATIVE 11/17/2019  CBC: Recent Labs  Lab 05/21/23 1723 05/22/23 0438  WBC 9.2 10.1  NEUTROABS 7.3  --   HGB 15.6* 14.1  HCT 48.2* 42.4  MCV 92.0 91.0  PLT 281 271   Cardiac Enzymes: No results for input(s): "CKTOTAL", "CKMB", "CKMBINDEX", "TROPONINI" in the last 168 hours. BNP (last 3 results) No results for input(s): "PROBNP" in the last 8760 hours. CBG: Recent Labs  Lab 05/22/23 0803 05/22/23 1204 05/22/23 1633 05/22/23 2155 05/23/23 0744  GLUCAP 108* 164* 107* 109* 115*   D-Dimer: No results for input(s): "DDIMER" in the last 72 hours. Hgb A1c: No results for input(s): "HGBA1C" in the last 72 hours. Lipid Profile: No results for input(s): "CHOL", "HDL", "LDLCALC", "TRIG",  "CHOLHDL", "LDLDIRECT" in the last 72 hours. Thyroid function studies: No results for input(s): "TSH", "T4TOTAL", "T3FREE", "THYROIDAB" in the last 72 hours.  Invalid input(s): "FREET3" Anemia work up: No results for input(s): "VITAMINB12", "FOLATE", "FERRITIN", "TIBC", "IRON", "RETICCTPCT" in the last 72 hours. Sepsis Labs: Recent Labs  Lab 05/21/23 1723 05/22/23 0438  WBC 9.2 10.1   Microbiology Recent Results (from the past 240 hour(s))  SARS Coronavirus 2 by RT PCR (hospital order, performed in [facility] hospital lab) *cepheid single result test* Anterior Nasal Swab     Status: None   Collection Time: 05/21/23  5:23 PM   Specimen: Anterior Nasal Swab  Result Value Ref Range Status   SARS Coronavirus 2 by RT PCR NEGATIVE NEGATIVE Final    Comment: (NOTE) SARS-CoV-2 target nucleic acids are NOT DETECTED.  The SARS-CoV-2 RNA is generally detectable in upper and lower respiratory specimens during the acute phase of infection. The lowest concentration of SARS-CoV-2 viral copies this assay can detect is 250 copies / mL. A negative result does not preclude SARS-CoV-2 infection and should not be used as the sole basis for treatment or other patient management decisions.  A negative result may occur with improper specimen collection / handling, submission of specimen other than nasopharyngeal swab, presence of viral mutation(s) within the areas targeted by this assay, and inadequate number of viral copies (<250 copies / mL). A negative result must be combined with clinical observations, patient history, and epidemiological information.  Fact Sheet for Patients:   RoadLapTop.co.za  Fact Sheet for Healthcare Providers: http://kim-miller.com/  This test is not yet approved or  cleared by the [place] FDA and has been authorized for detection and/or diagnosis of SARS-CoV-2 by FDA under an Emergency Use Authorization (EUA).  This  EUA will remain in effect (meaning this test can be used) for the duration of the COVID-19 declaration under Section 564(b)(1) of the Act, 21 U.S.C. section 360bbb-3(b)(1), unless the authorization is terminated or revoked sooner.  Performed at [facility], [address], [place], [place] [address]      Medications:    amLODipine  10 mg Oral Daily   bisacodyl  10 mg Rectal Daily   enoxaparin (LOVENOX) injection  40 mg Subcutaneous Q24H   insulin aspart  0-6 Units Subcutaneous TID WC   polyethylene glycol  17 g Oral BID   senna-docusate  1 tablet Oral QHS   Continuous Infusions:  cefTRIAXone (ROCEPHIN)  IV 1 g (05/22/23 2229)      LOS: 1 day   Marinda Elk  [facility]  05/23/2023, 9:42 AM

## 2023-05-24 DIAGNOSIS — N3000 Acute cystitis without hematuria: Secondary | ICD-10-CM | POA: Diagnosis not present

## 2023-05-24 LAB — URINE CULTURE: Culture: >=100000 — AB

## 2023-05-24 LAB — GLUCOSE, CAPILLARY
Glucose-Capillary: 176 mg/dL — ABNORMAL HIGH (ref 70–99)
Glucose-Capillary: 109 mg/dL — ABNORMAL HIGH (ref 70–99)
Glucose-Capillary: 194 mg/dL — ABNORMAL HIGH (ref 70–99)
Glucose-Capillary: 190 mg/dL — ABNORMAL HIGH (ref 70–99)
Glucose-Capillary: 122 mg/dL — ABNORMAL HIGH (ref 70–99)

## 2023-05-24 MED ORDER — SORBITOL 70 % SOLN
60.0000 mL | Freq: Once | Status: DC
  Filled 2023-05-24 (×2): qty 60

## 2023-05-24 MED ORDER — POLYETHYLENE GLYCOL 3350 17 G PO PACK
17.0000 g | PACK | Freq: Two times a day (BID) | ORAL | Status: AC
  Administered 2023-05-24 – 2023-05-25 (×4): 17 g via ORAL
  Filled 2023-05-24 (×4): qty 1

## 2023-05-24 MED ORDER — CEPHALEXIN 500 MG PO CAPS
500.0000 mg | ORAL_CAPSULE | Freq: Three times a day (TID) | ORAL | Status: DC
  Administered 2023-05-24 – 2023-05-27 (×9): 500 mg via ORAL
  Filled 2023-05-24 (×9): qty 1

## 2023-05-24 NOTE — Progress Notes (Signed)
[facility] PROGRESS NOTE    Progress Note  Alexa [name]  NGE:[id number] DOB: 01-13-1930 DOA: 05/21/2023 PCP: Charlane Ferretti, DO     Brief Narrative:   Alexa [name] is an 89 y.o. female past medical history of COPD, essential hypertension renal cell carcinoma with left-sided nephrectomy, spinal stenosis, recurrent UTIs comes in for acute nausea vomiting and presyncopal episode, associated with suprapubic abdominal pain diaphoresis.  Assessment/Plan:  Near syncopal, nausea vomiting and suprapubic pain: Possibly vasovagal as this was after straining to have a bowel movement, cardiac biomarkers have been flat. Twelve-lead EKG showed first-degree AV block with PACs. Continue Zofran with symptomatic management she was allowed diet. PT evaluated the patient will need to go to skilled nursing facility.  Possibly due to urinary tract infection: Show more than 50 white blood cells and positive nitrates started empirically on Rocephin, Urine cultures grew E. coli sensitive to Ancef.  Elevated troponin Cardiac biomarkers are basically remained flat she denies any chest pain or shortness of breath. Twelve-lead EKG showed no evidence of ischemia.  Essential hypertension: Blood pressure significantly elevated and Norvasc was increased, will start on low-dose hydralazine.  Chronic constipation: CT scan of the abdomen pelvis showed diverticulosis without diverticulitis status post nephrectomy. She denies she had a small bowel movement that she still feels like she needs to go to the bathroom. Continue MiraLAX and sorbitol.  Stage 3a chronic kidney disease (CKD) (HCC) Creatinine has remained baseline.  Goals of care: Will go back with hospice at home.  DVT prophylaxis: lovenox Family Communication:none Status is: Observation The patient remains OBS appropriate and will d/c before 2 midnights.    Code Status:     Code Status Orders  (From admission, onward)            Start     Ordered   05/21/23 2332  Do not attempt resuscitation (DNR)- Limited -Do Not Intubate (DNI)  Continuous       Question Answer Comment  If pulseless and not breathing No CPR or chest compressions.   In Pre-Arrest Conditions (Patient Is Breathing and Has A Pulse) Do not intubate. Provide all appropriate non-invasive medical interventions. Avoid ICU transfer unless indicated or required.   Consent: Discussion documented in EHR or advanced directives reviewed      05/21/23 2335           Code Status History     Date Active Date Inactive Code Status Order ID Comments User Context   02/13/2023 2059 02/17/2023 1722 DNR [id number]  Charlsie Quest, MD ED   12/15/2021 0554 12/20/2021 0021 DNR [id number]  Briscoe Deutscher, MD Inpatient   11/22/2014 1451 11/23/2014 1452 Full Code [id number]  Conley Canal Inpatient   02/05/2012 1623 02/07/2012 1406 Full Code [id number]  Vernie Shanks, RN Inpatient      Advance Directive Documentation    Flowsheet Row Most Recent Value  Type of Advance Directive Out of facility DNR (pink MOST or yellow form)  Pre-existing out of facility DNR order (yellow form or pink MOST form) --  "MOST" Form in Place? --         IV Access:   Peripheral IV   Procedures and diagnostic studies:   No results found.   Medical Consultants:   None.   Subjective:    Tilisa Detorres Weitz feels better, but only had a small bowel movement she relates she still feels like she needs to have a bowel movement  Objective:  Vitals:   05/23/23 0608 05/23/23 1200 05/23/23 2006 05/24/23 0456  BP: (!) 174/106 (!) 148/77 (!) 144/98 (!) 158/91  Pulse: 71 70 91 81  Resp: 18 16 18 18   Temp: 97.9 F (36.6 C) 97.8 F (36.6 C) 98.4 F (36.9 C) 98.1 F (36.7 C)  TempSrc: Oral Oral Oral Oral  SpO2: 97% 95% 95% 96%  Weight:      Height:       SpO2: 96 %   Intake/Output Summary (Last 24 hours) at 05/24/2023 1036 Last data filed at 05/24/2023 0947 Gross per 24  hour  Intake 780 ml  Output 400 ml  Net 380 ml   Filed Weights   05/22/23 0102  Weight: 49.2 kg    Exam: General exam: In no acute distress. Respiratory system: Good air movement and clear to auscultation. Cardiovascular system: S1 & S2 heard, RRR. No JVD. Gastrointestinal system: Abdomen is nondistended, soft and nontender.  Extremities: No pedal edema. Skin: No rashes, lesions or ulcers Psychiatry: Judgement and insight appear normal. Mood & affect appropriate.  Data Reviewed:    Labs: Basic Metabolic Panel: Recent Labs  Lab 05/21/23 1723 05/22/23 0438  NA 140 139  K 3.7 3.7  CL 105 105  CO2 25 26  GLUCOSE 181* 122*  BUN 23 16  CREATININE 0.80 0.62  CALCIUM 9.0 8.9  MG  --  2.1  PHOS  --  3.1   GFR Estimated Creatinine Clearance: 35.6 mL/min (by C-G formula based on SCr of 0.62 mg/dL). Liver Function Tests: Recent Labs  Lab 05/21/23 1723  AST 22  ALT 16  ALKPHOS 67  BILITOT 0.7  PROT 7.5  ALBUMIN 4.1   Recent Labs  Lab 05/21/23 1723  LIPASE 70*   No results for input(s): "AMMONIA" in the last 168 hours. Coagulation profile No results for input(s): "INR", "PROTIME" in the last 168 hours. COVID-19 Labs  No results for input(s): "DDIMER", "FERRITIN", "LDH", "CRP" in the last 72 hours.  Lab Results  Component Value Date   SARSCOV2NAA NEGATIVE 05/21/2023   SARSCOV2NAA NEGATIVE 02/13/2023   SARSCOV2NAA NEGATIVE 11/17/2019    CBC: Recent Labs  Lab 05/21/23 1723 05/22/23 0438  WBC 9.2 10.1  NEUTROABS 7.3  --   HGB 15.6* 14.1  HCT 48.2* 42.4  MCV 92.0 91.0  PLT 281 271   Cardiac Enzymes: No results for input(s): "CKTOTAL", "CKMB", "CKMBINDEX", "TROPONINI" in the last 168 hours. BNP (last 3 results) No results for input(s): "PROBNP" in the last 8760 hours. CBG: Recent Labs  Lab 05/23/23 0744 05/23/23 1156 05/23/23 1651 05/24/23 0458 05/24/23 0808  GLUCAP 115* 158* 115* 176* 109*   D-Dimer: No results for input(s): "DDIMER" in  the last 72 hours. Hgb A1c: No results for input(s): "HGBA1C" in the last 72 hours. Lipid Profile: No results for input(s): "CHOL", "HDL", "LDLCALC", "TRIG", "CHOLHDL", "LDLDIRECT" in the last 72 hours. Thyroid function studies: No results for input(s): "TSH", "T4TOTAL", "T3FREE", "THYROIDAB" in the last 72 hours.  Invalid input(s): "FREET3" Anemia work up: No results for input(s): "VITAMINB12", "FOLATE", "FERRITIN", "TIBC", "IRON", "RETICCTPCT" in the last 72 hours. Sepsis Labs: Recent Labs  Lab 05/21/23 1723 05/22/23 0438  WBC 9.2 10.1   Microbiology Recent Results (from the past 240 hour(s))  SARS Coronavirus 2 by RT PCR (hospital order, performed in [facility] hospital lab) *cepheid single result test* Anterior Nasal Swab     Status: None   Collection Time: 05/21/23  5:23 PM   Specimen: Anterior Nasal Swab  Result Value Ref Range Status   SARS Coronavirus 2 by RT PCR NEGATIVE NEGATIVE Final    Comment: (NOTE) SARS-CoV-2 target nucleic acids are NOT DETECTED.  The SARS-CoV-2 RNA is generally detectable in upper and lower respiratory specimens during the acute phase of infection. The lowest concentration of SARS-CoV-2 viral copies this assay can detect is 250 copies / mL. A negative result does not preclude SARS-CoV-2 infection and should not be used as the sole basis for treatment or other patient management decisions.  A negative result may occur with improper specimen collection / handling, submission of specimen other than nasopharyngeal swab, presence of viral mutation(s) within the areas targeted by this assay, and inadequate number of viral copies (<250 copies / mL). A negative result must be combined with clinical observations, patient history, and epidemiological information.  Fact Sheet for Patients:   RoadLapTop.co.za  Fact Sheet for Healthcare Providers: http://kim-miller.com/  This test is not yet approved or   cleared by the [place] FDA and has been authorized for detection and/or diagnosis of SARS-CoV-2 by FDA under an Emergency Use Authorization (EUA).  This EUA will remain in effect (meaning this test can be used) for the duration of the COVID-19 declaration under Section 564(b)(1) of the Act, 21 U.S.C. section 360bbb-3(b)(1), unless the authorization is terminated or revoked sooner.  Performed at [facility], [address], [place], [place] [address]   Urine Culture     Status: Abnormal   Collection Time: 05/21/23  8:33 PM   Specimen: Urine, Clean Catch  Result Value Ref Range Status   Specimen Description   Final    URINE, CLEAN CATCH Performed at [facility], [address], [place], [place] [address]    Special Requests   Final    NONE Performed at [facility], [address], [place], [place] [address]    Culture >=100,000 COLONIES/mL ESCHERICHIA COLI (A)  Final   Report Status 05/24/2023 FINAL  Final   Organism ID, Bacteria ESCHERICHIA COLI (A)  Final      Susceptibility   Escherichia coli - MIC*    AMPICILLIN >=32 RESISTANT Resistant     CEFAZOLIN <=4 SENSITIVE Sensitive     CEFEPIME <=0.12 SENSITIVE Sensitive     CEFTRIAXONE <=0.25 SENSITIVE Sensitive     CIPROFLOXACIN <=0.25 SENSITIVE Sensitive     GENTAMICIN <=1 SENSITIVE Sensitive     IMIPENEM <=0.25 SENSITIVE Sensitive     NITROFURANTOIN <=16 SENSITIVE Sensitive     TRIMETH/SULFA <=20 SENSITIVE Sensitive     AMPICILLIN/SULBACTAM 16 INTERMEDIATE Intermediate     PIP/TAZO <=4 SENSITIVE Sensitive ug/mL    * >=100,000 COLONIES/mL ESCHERICHIA COLI     Medications:    amLODipine  10 mg Oral Daily   bisacodyl  10 mg Rectal Daily   enoxaparin (LOVENOX) injection  40 mg Subcutaneous Q24H   hydrALAZINE  10 mg Oral Q6H   insulin aspart  0-6 Units Subcutaneous TID WC   senna-docusate  1 tablet Oral QHS   Continuous Infusions:  cefTRIAXone (ROCEPHIN)   IV 1 g (05/23/23 2303)      LOS: 2 days   Marinda Elk  [facility]  05/24/2023, 10:36 AM

## 2023-05-24 NOTE — Plan of Care (Signed)
  Problem: Education: Goal: Ability to describe self-care measures that may prevent or decrease complications (Diabetes Survival Skills Education) will improve Outcome: Progressing   Problem: Coping: Goal: Ability to adjust to condition or change in health will improve Outcome: Progressing   Problem: Clinical Measurements: Goal: Respiratory complications will improve Outcome: Progressing

## 2023-05-24 NOTE — Progress Notes (Signed)
Mobility Specialist - Progress Note   05/24/23 1053  Mobility  Activity Ambulated with assistance in hallway  Level of Assistance Standby assist, set-up cues, supervision of patient - no hands on  Assistive Device Front wheel walker  Distance Ambulated (ft) 40 ft  Activity Response Tolerated well  Mobility Referral Yes  $Mobility charge 1 Mobility  Mobility Specialist Start Time (ACUTE ONLY) 0945  Mobility Specialist Stop Time (ACUTE ONLY) [id number]  Mobility Specialist Time Calculation (min) (ACUTE ONLY) 13 min   Pt received in bed and agreeable to mobility. Pt was minA from STS. No complaints during session. Pt to recliner after session with all needs met.   [facility]

## 2023-05-25 DIAGNOSIS — R7989 Other specified abnormal findings of blood chemistry: Secondary | ICD-10-CM | POA: Diagnosis not present

## 2023-05-25 DIAGNOSIS — N3000 Acute cystitis without hematuria: Secondary | ICD-10-CM | POA: Diagnosis not present

## 2023-05-25 DIAGNOSIS — R112 Nausea with vomiting, unspecified: Secondary | ICD-10-CM

## 2023-05-25 LAB — GLUCOSE, CAPILLARY
Glucose-Capillary: 120 mg/dL — ABNORMAL HIGH (ref 70–99)
Glucose-Capillary: 122 mg/dL — ABNORMAL HIGH (ref 70–99)
Glucose-Capillary: 107 mg/dL — ABNORMAL HIGH (ref 70–99)
Glucose-Capillary: 111 mg/dL — ABNORMAL HIGH (ref 70–99)

## 2023-05-25 NOTE — Plan of Care (Signed)
  Problem: Education: Goal: Ability to describe self-care measures that may prevent or decrease complications (Diabetes Survival Skills Education) will improve Outcome: Progressing Goal: Individualized Educational Video(s) Outcome: Progressing   Problem: Fluid Volume: Goal: Ability to maintain a balanced intake and output will improve Outcome: Progressing   Problem: Health Behavior/Discharge Planning: Goal: Ability to identify and utilize available resources and services will improve Outcome: Progressing Goal: Ability to manage health-related needs will improve Outcome: Progressing

## 2023-05-25 NOTE — Progress Notes (Signed)
Mobility Specialist - Progress Note   05/25/23 1524  Mobility  Activity Ambulated with assistance in hallway;Ambulated with assistance to bathroom  Level of Assistance Standby assist, set-up cues, supervision of patient - no hands on  Assistive Device Front wheel walker  Distance Ambulated (ft) 250 ft  Activity Response Tolerated well  Mobility Referral Yes  $Mobility charge 1 Mobility  Mobility Specialist Start Time (ACUTE ONLY) 0241  Mobility Specialist Stop Time (ACUTE ONLY) 0305  Mobility Specialist Time Calculation (min) (ACUTE ONLY) 24 min   Pt received in bed and agreeable to mobility. Prior to ambulating, pt requested assistance to bathroom for BM. No complaints during session. Pt to bed to recliner after session with all needs met.   [facility]

## 2023-05-25 NOTE — Progress Notes (Signed)
[facility] PROGRESS NOTE    Progress Note  Alexa [name]  GNF:[id number] DOB: 1929-09-09 DOA: 05/21/2023 PCP: Charlane Ferretti, DO     Brief Narrative:   Alexa [name] is an 89 y.o. female past medical history of COPD, essential hypertension renal cell carcinoma with left-sided nephrectomy, spinal stenosis, recurrent UTIs comes in for acute nausea vomiting and presyncopal episode, associated with suprapubic abdominal pain diaphoresis.  Assessment/Plan:  Near syncopal, nausea vomiting and suprapubic pain: Possibly vasovagal as this was after straining to have a bowel movement, cardiac biomarkers have been flat.  Symptoms now been resolved. Continue Zofran with symptomatic management she was allowed diet. PT evaluated the patient will need to go to skilled nursing facility. Patient relates she does not want to go nursing facility she would like to go home.  Possibly due to urinary tract infection: Urine cultures grew E. coli sensitive to Ancef. Has remained afebrile. Transition to oral Keflex.  She will complete her treatment as an outpatient. PT evaluated the patient will need skilled nursing facility.  Elevated troponin Cardiac biomarkers have basically remained flat likely demand ischemia.  Essential hypertension: Blood pressure is improved continue Norvasc and hydralazine.  Chronic constipation: CT scan of the abdomen pelvis showed diverticulosis without diverticulitis status post nephrectomy. She started on sorbitol and MiraLAX has had good bowel movements.  l.  Stage 3a chronic kidney disease (CKD) (HCC) Creatinine has remained baseline.  Goals of care: Will go back with hospice at home.  DVT prophylaxis: lovenox Family Communication:none Status is: Observation The patient remains OBS appropriate and will d/c before 2 midnights.    Code Status:     Code Status Orders  (From admission, onward)           Start     Ordered   05/21/23 2332  Do not  attempt resuscitation (DNR)- Limited -Do Not Intubate (DNI)  Continuous       Question Answer Comment  If pulseless and not breathing No CPR or chest compressions.   In Pre-Arrest Conditions (Patient Is Breathing and Has A Pulse) Do not intubate. Provide all appropriate non-invasive medical interventions. Avoid ICU transfer unless indicated or required.   Consent: Discussion documented in EHR or advanced directives reviewed      05/21/23 2335           Code Status History     Date Active Date Inactive Code Status Order ID Comments User Context   02/13/2023 2059 02/17/2023 1722 DNR [id number]  Charlsie Quest, MD ED   12/15/2021 0554 12/20/2021 0021 DNR [id number]  Briscoe Deutscher, MD Inpatient   11/22/2014 1451 11/23/2014 1452 Full Code [id number]  Conley Canal Inpatient   02/05/2012 1623 02/07/2012 1406 Full Code [id number]  Vernie Shanks, RN Inpatient      Advance Directive Documentation    Flowsheet Row Most Recent Value  Type of Advance Directive Out of facility DNR (pink MOST or yellow form)  Pre-existing out of facility DNR order (yellow form or pink MOST form) --  "MOST" Form in Place? --         IV Access:   Peripheral IV   Procedures and diagnostic studies:   No results found.   Medical Consultants:   None.   Subjective:    Alexa [name] had a bowel movement feels better she relates she does not want to go to skilled nursing facility she would like to go home.  Objective:    Vitals:  05/24/23 1402 05/24/23 1404 05/24/23 2132 05/25/23 0508  BP: (!) 151/85 133/83 (!) 154/92 (!) 154/95  Pulse: 81 86 78 82  Resp: 20  18 19   Temp: 97.9 F (36.6 C)  (!) 97.5 F (36.4 C) 98 F (36.7 C)  TempSrc: Oral  Oral Oral  SpO2: 94%  98% 99%  Weight:      Height:       SpO2: 99 %   Intake/Output Summary (Last 24 hours) at 05/25/2023 0958 Last data filed at 05/25/2023 0500 Gross per 24 hour  Intake 660 ml  Output 550 ml  Net 110 ml   Filed  Weights   05/22/23 0102  Weight: 49.2 kg    Exam: General exam: In no acute distress. Respiratory system: Good air movement and clear to auscultation. Cardiovascular system: S1 & S2 heard, RRR. No JVD. Gastrointestinal system: Abdomen is nondistended, soft and nontender.  Extremities: No pedal edema. Skin: No rashes, lesions or ulcers Psychiatry: Judgement and insight appear normal. Mood & affect appropriate.  Data Reviewed:    Labs: Basic Metabolic Panel: Recent Labs  Lab 05/21/23 1723 05/22/23 0438  NA 140 139  K 3.7 3.7  CL 105 105  CO2 25 26  GLUCOSE 181* 122*  BUN 23 16  CREATININE 0.80 0.62  CALCIUM 9.0 8.9  MG  --  2.1  PHOS  --  3.1   GFR Estimated Creatinine Clearance: 35.6 mL/min (by C-G formula based on SCr of 0.62 mg/dL). Liver Function Tests: Recent Labs  Lab 05/21/23 1723  AST 22  ALT 16  ALKPHOS 67  BILITOT 0.7  PROT 7.5  ALBUMIN 4.1   Recent Labs  Lab 05/21/23 1723  LIPASE 70*   No results for input(s): "AMMONIA" in the last 168 hours. Coagulation profile No results for input(s): "INR", "PROTIME" in the last 168 hours. COVID-19 Labs  No results for input(s): "DDIMER", "FERRITIN", "LDH", "CRP" in the last 72 hours.  Lab Results  Component Value Date   SARSCOV2NAA NEGATIVE 05/21/2023   SARSCOV2NAA NEGATIVE 02/13/2023   SARSCOV2NAA NEGATIVE 11/17/2019    CBC: Recent Labs  Lab 05/21/23 1723 05/22/23 0438  WBC 9.2 10.1  NEUTROABS 7.3  --   HGB 15.6* 14.1  HCT 48.2* 42.4  MCV 92.0 91.0  PLT 281 271   Cardiac Enzymes: No results for input(s): "CKTOTAL", "CKMB", "CKMBINDEX", "TROPONINI" in the last 168 hours. BNP (last 3 results) No results for input(s): "PROBNP" in the last 8760 hours. CBG: Recent Labs  Lab 05/24/23 0808 05/24/23 1154 05/24/23 1626 05/24/23 2134 05/25/23 0755  GLUCAP 109* 194* 190* 122* 120*   D-Dimer: No results for input(s): "DDIMER" in the last 72 hours. Hgb A1c: No results for input(s):  "HGBA1C" in the last 72 hours. Lipid Profile: No results for input(s): "CHOL", "HDL", "LDLCALC", "TRIG", "CHOLHDL", "LDLDIRECT" in the last 72 hours. Thyroid function studies: No results for input(s): "TSH", "T4TOTAL", "T3FREE", "THYROIDAB" in the last 72 hours.  Invalid input(s): "FREET3" Anemia work up: No results for input(s): "VITAMINB12", "FOLATE", "FERRITIN", "TIBC", "IRON", "RETICCTPCT" in the last 72 hours. Sepsis Labs: Recent Labs  Lab 05/21/23 1723 05/22/23 0438  WBC 9.2 10.1   Microbiology Recent Results (from the past 240 hour(s))  SARS Coronavirus 2 by RT PCR (hospital order, performed in [facility] hospital lab) *cepheid single result test* Anterior Nasal Swab     Status: None   Collection Time: 05/21/23  5:23 PM   Specimen: Anterior Nasal Swab  Result Value Ref Range Status  SARS Coronavirus 2 by RT PCR NEGATIVE NEGATIVE Final    Comment: (NOTE) SARS-CoV-2 target nucleic acids are NOT DETECTED.  The SARS-CoV-2 RNA is generally detectable in upper and lower respiratory specimens during the acute phase of infection. The lowest concentration of SARS-CoV-2 viral copies this assay can detect is 250 copies / mL. A negative result does not preclude SARS-CoV-2 infection and should not be used as the sole basis for treatment or other patient management decisions.  A negative result may occur with improper specimen collection / handling, submission of specimen other than nasopharyngeal swab, presence of viral mutation(s) within the areas targeted by this assay, and inadequate number of viral copies (<250 copies / mL). A negative result must be combined with clinical observations, patient history, and epidemiological information.  Fact Sheet for Patients:   RoadLapTop.co.za  Fact Sheet for Healthcare Providers: http://kim-miller.com/  This test is not yet approved or  cleared by the [place] FDA and has been  authorized for detection and/or diagnosis of SARS-CoV-2 by FDA under an Emergency Use Authorization (EUA).  This EUA will remain in effect (meaning this test can be used) for the duration of the COVID-19 declaration under Section 564(b)(1) of the Act, 21 U.S.C. section 360bbb-3(b)(1), unless the authorization is terminated or revoked sooner.  Performed at [facility], [address] [place], [place] [address]   Urine Culture     Status: Abnormal   Collection Time: 05/21/23  8:33 PM   Specimen: Urine, Clean Catch  Result Value Ref Range Status   Specimen Description   Final    URINE, CLEAN CATCH Performed at [facility], [address] [place], [place] [address]    Special Requests   Final    NONE Performed at [facility], [address], [place], [place] [address]    Culture >=100,000 COLONIES/mL ESCHERICHIA COLI (A)  Final   Report Status 05/24/2023 FINAL  Final   Organism ID, Bacteria ESCHERICHIA COLI (A)  Final      Susceptibility   Escherichia coli - MIC*    AMPICILLIN >=32 RESISTANT Resistant     CEFAZOLIN <=4 SENSITIVE Sensitive     CEFEPIME <=0.12 SENSITIVE Sensitive     CEFTRIAXONE <=0.25 SENSITIVE Sensitive     CIPROFLOXACIN <=0.25 SENSITIVE Sensitive     GENTAMICIN <=1 SENSITIVE Sensitive     IMIPENEM <=0.25 SENSITIVE Sensitive     NITROFURANTOIN <=16 SENSITIVE Sensitive     TRIMETH/SULFA <=20 SENSITIVE Sensitive     AMPICILLIN/SULBACTAM 16 INTERMEDIATE Intermediate     PIP/TAZO <=4 SENSITIVE Sensitive ug/mL    * >=100,000 COLONIES/mL ESCHERICHIA COLI     Medications:    amLODipine  10 mg Oral Daily   bisacodyl  10 mg Rectal Daily   cephALEXin  500 mg Oral Q8H   enoxaparin (LOVENOX) injection  40 mg Subcutaneous Q24H   hydrALAZINE  10 mg Oral Q6H   insulin aspart  0-6 Units Subcutaneous TID WC   polyethylene glycol  17 g Oral BID   senna-docusate  1 tablet Oral QHS   sorbitol  60 mL Oral Once    Continuous Infusions:      LOS: 3 days   Marinda Elk  [facility]  05/25/2023, 9:58 AM

## 2023-05-26 DIAGNOSIS — N1831 Chronic kidney disease, stage 3a: Secondary | ICD-10-CM | POA: Diagnosis not present

## 2023-05-26 DIAGNOSIS — N3000 Acute cystitis without hematuria: Secondary | ICD-10-CM | POA: Diagnosis not present

## 2023-05-26 DIAGNOSIS — R112 Nausea with vomiting, unspecified: Secondary | ICD-10-CM | POA: Diagnosis not present

## 2023-05-26 DIAGNOSIS — R7989 Other specified abnormal findings of blood chemistry: Secondary | ICD-10-CM | POA: Diagnosis not present

## 2023-05-26 LAB — GLUCOSE, CAPILLARY
Glucose-Capillary: 145 mg/dL — ABNORMAL HIGH (ref 70–99)
Glucose-Capillary: 243 mg/dL — ABNORMAL HIGH (ref 70–99)
Glucose-Capillary: 127 mg/dL — ABNORMAL HIGH (ref 70–99)
Glucose-Capillary: 144 mg/dL — ABNORMAL HIGH (ref 70–99)

## 2023-05-26 MED ORDER — CEPHALEXIN 500 MG PO CAPS: 500.0000 mg | ORAL_CAPSULE | Freq: Three times a day (TID) | ORAL | 0 refills | Status: AC

## 2023-05-26 NOTE — TOC Progression Note (Signed)
Transition of Care [facility]) - Progression Note    Patient Details  Name: Alexa [name] MRN: [id number] Date of Birth: 1929/11/17  [facility] CM/SW Contact  Amada Jupiter, LCSW Phone Number: 05/26/2023, 2:57 PM  Clinical Narrative:     Met with pt today to discuss dc plans.  Pt now agreeable with plan for SNF and requests [facility].  Have started insurance authorization - still pending decision at this time.  Expected Discharge Plan: Skilled Nursing Facility Barriers to Discharge: Continued Medical Work up  Expected Discharge Plan and Services   Discharge Planning Services: CM Consult Post Acute Care Choice: Skilled Nursing Facility Living arrangements for the past 2 months: Single Family Home Expected Discharge Date: 05/26/23                                     Social Determinants of Health (SDOH) Interventions SDOH Screenings   Food Insecurity: No Food Insecurity (05/23/2023)  Housing: Patient Declined (05/23/2023)  Transportation Needs: No Transportation Needs (05/23/2023)  Utilities: Not At Risk (05/23/2023)  Alcohol Screen: Low Risk  (04/03/2018)  Depression (PHQ2-9): Low Risk  (04/07/2020)  Financial Resource Strain: Low Risk  (08/08/2020)  Tobacco Use: Low Risk  (05/22/2023)    Readmission Risk Interventions    05/23/2023   11:53 AM 02/17/2023   10:42 AM 12/17/2021    1:08 PM  Readmission Risk Prevention Plan  Post Dischage Appt  Complete   Medication Screening  Complete   Transportation Screening Complete Complete Complete  PCP or Specialist Appt within 5-7 Days Complete  Complete  Home Care Screening Complete  Complete  Medication Review (RN CM) Complete  Complete

## 2023-05-26 NOTE — Plan of Care (Signed)
  Problem: Nutritional: Goal: Maintenance of adequate nutrition will improve Outcome: Progressing   Problem: Education: Goal: Knowledge of General Education information will improve Description: Including pain rating scale, medication(s)/side effects and non-pharmacologic comfort measures Outcome: Progressing

## 2023-05-26 NOTE — Progress Notes (Signed)
Physical Therapy Treatment Patient Details Name: Alexa [name] MRN: [id number] DOB: Dec 27, 1929 Today's Date: 05/26/2023   History of Present Illness 89 y.o. female with hx of COPD, hypertension, diabetes, renal cell carcinoma with left-sided nephrectomy, spinal stenosis, chronic pain, recurrent UTI, who presents after acute onset of nausea and vomiting, presyncopal episode. Dx of possible UTI, presyncopal episode    PT Comments  Pt tolerated increased ambulation distance of 140' with RW, no loss of balance, distance limited by back pain and fatigue, pain medication requested. Pt reports she does not have help available at home and that she's agreeable to ST-SNF.    If plan is discharge home, recommend the following: A little help with walking and/or transfers;A little help with bathing/dressing/bathroom;Assistance with cooking/housework;Assist for transportation;Help with stairs or ramp for entrance   Can travel by private vehicle     Yes  Equipment Recommendations  None recommended by PT    Recommendations for Other Services       Precautions / Restrictions Precautions Precautions: Fall Precaution Comments: "I've probably had some falls, I can't remember". Restrictions Weight Bearing Restrictions: No     Mobility  Bed Mobility               General bed mobility comments: up in recliner    Transfers Overall transfer level: Needs assistance Equipment used: Rolling walker (2 wheels) Transfers: Sit to/from Stand Sit to Stand: Supervision           General transfer comment: VCs hand placement.    Ambulation/Gait Ambulation/Gait assistance: Supervision Gait Distance (Feet): 140 Feet Assistive device: Rolling walker (2 wheels) Gait Pattern/deviations: Step-through pattern, Decreased stride length Gait velocity: decr     General Gait Details: steady, no loss of balance, distance limited by fatigue and back pain, pt denied dizziness with walking   Stairs              Wheelchair Mobility     Tilt Bed    Modified Rankin (Stroke Patients Only)       Balance Overall balance assessment: Needs assistance   Sitting balance-Leahy Scale: Good     Standing balance support: During functional activity, Reliant on assistive device for balance, Bilateral upper extremity supported Standing balance-Leahy Scale: Fair                              Cognition Arousal: Alert Behavior During Therapy: WFL for tasks assessed/performed Overall Cognitive Status: Impaired/Different from baseline Area of Impairment: Memory                     Memory: Decreased short-term memory         General Comments: Pt able to follow commands, is alert and oriented x 4.        Exercises      General Comments        Pertinent Vitals/Pain Pain Assessment Pain Score: 8  Pain Location: back Pain Descriptors / Indicators: Aching Pain Intervention(s): Limited activity within patient's tolerance, Monitored during session, Patient requesting pain meds-RN notified    Home Living                          Prior Function            PT Goals (current goals can now be found in the care plan section) Acute Rehab PT Goals Patient Stated Goal: to go to rehab then  back home PT Goal Formulation: With patient Time For Goal Achievement: 06/05/23 Potential to Achieve Goals: Good Progress towards PT goals: Progressing toward goals    Frequency    Min 1X/week      PT Plan      Co-evaluation              AM-PAC PT "6 Clicks" Mobility   Outcome Measure  Help needed turning from your back to your side while in a flat bed without using bedrails?: None Help needed moving from lying on your back to sitting on the side of a flat bed without using bedrails?: A Little Help needed moving to and from a bed to a chair (including a wheelchair)?: None Help needed standing up from a chair using your arms (e.g., wheelchair or  bedside chair)?: A Little Help needed to walk in hospital room?: A Little Help needed climbing 3-5 steps with a railing? : A Little 6 Click Score: 20    End of Session Equipment Utilized During Treatment: Gait belt Activity Tolerance: Patient tolerated treatment well Patient left: in chair;with chair alarm set;with call bell/phone within reach Nurse Communication: Mobility status PT Visit Diagnosis: Difficulty in walking, not elsewhere classified (R26.2);Pain     Time: 1610-9604 PT Time Calculation (min) (ACUTE ONLY): 18 min  Charges:    $Gait Training: 8-22 mins PT General Charges $$ ACUTE PT VISIT: 1 Visit                     Tamala Ser PT 05/26/2023  Acute Rehabilitation Services  Office [phone]

## 2023-05-26 NOTE — Discharge Summary (Addendum)
Physician Discharge Summary  Alexa [name] UEA:[id number] DOB: 1930/05/29 DOA: 05/21/2023  PCP: Charlane Ferretti, DO  Admit date: 05/21/2023 Discharge date: 05/26/2023  Admitted From: Home Disposition:  Home  Recommendations for Outpatient Follow-up:  Follow up with PCP in 1-2 weeks Please obtain BMP/CBC in one week   Home Health:No Equipment/Devices:None  Discharge Condition:Stable CODE STATUS:DNR Diet recommendation: Heart Healthy  Brief/Interim Summary: 89 y.o. female past medical history of COPD, essential hypertension renal cell carcinoma with left-sided nephrectomy, spinal stenosis, recurrent UTIs comes in for acute nausea vomiting and presyncopal episode, associated with suprapubic abdominal pain diaphoresis.   Discharge Diagnoses:  Principal Problem:   Urinary tract infection Active Problems:   Elevated troponin   Essential hypertension   Stage 3a chronic kidney disease (CKD) (HCC)   UTI (urinary tract infection)   Near syncope   Constipation  Near syncopal episode, with nausea and vomiting and suprapubic pain: Possibly vasovagal as she was straining to have a bowel movement when she started to feel lightheaded. Cardiac biomarkers have been flat symptoms are now resolved. PT OT evaluated the patient recommended skilled.  Possible UTI: Urine culture grew E. coli, she was started on admission on IV antibiotics now transition to oral Keflex which she will continue as an outpatient.  Elevated troponins: She denies any chest pain cardiac biomarkers have basically remained flat, elevated likely due to demand ischemia.  Essential hypertension: No changes made to her medication.  Chronic constipation: She was started on MiraLAX and sorbitol this resolved for constipation.  Chronic kidney disease stage IIIa: His creatinine remained at baseline.  Goals of care: She will go home with hospice she does not want to go to skilled nursing facility.  Discharge  Instructions  Discharge Instructions     Diet - low sodium heart healthy   Complete by: As directed    Increase activity slowly   Complete by: As directed       Allergies as of 05/26/2023       Reactions   Lisinopril    Cough D/Ced by Dr Sherene Sires   Codeine    nausea   Verapamil    Pain in feet        Medication List     STOP taking these medications    HYDROcodone-acetaminophen 5-325 MG tablet Commonly known as: NORCO/VICODIN       TAKE these medications    acetaminophen 500 MG tablet Commonly known as: TYLENOL Take 500-1,000 mg by mouth every 6 (six) hours as needed for moderate pain (pain score 4-6).   albuterol (2.5 MG/3ML) 0.083% nebulizer solution Commonly known as: PROVENTIL Take 2.5 mg by nebulization every 4 (four) hours as needed for wheezing or shortness of breath.   amLODipine 5 MG tablet Commonly known as: NORVASC Take 2 tablets (10 mg total) by mouth daily. What changed: how much to take   cephALEXin 500 MG capsule Commonly known as: KEFLEX Take 1 capsule (500 mg total) by mouth every 8 (eight) hours for 3 days.   CRANBERRY PO Take 2 tablets by mouth 2 (two) times daily.   DIABETIC BASICS HEALTHY FOOT EX Apply 1 application  topically at bedtime.   ESTRACE VAGINAL 0.1 MG/GM vaginal cream Generic drug: estradiol Apply locally every other night at bedtime   fluticasone 50 MCG/ACT nasal spray Commonly known as: FLONASE Place 1 spray into both nostrils daily.   hydrOXYzine 10 MG tablet Commonly known as: ATARAX Take 10 mg by mouth daily.   RED WINE COMPLEX PO  Take 1 tablet by mouth daily.   sitaGLIPtin 50 MG tablet Commonly known as: JANUVIA Take 50 mg by mouth daily as needed.   VITAMIN B 12 PO Take 1 Dose by mouth daily.   Vitamin D (Ergocalciferol) 1.25 MG (50000 UNIT) Caps capsule Commonly known as: DRISDOL Take 50,000 Units by mouth once a week.   VITAMIN D PO Take 1 tablet by mouth daily.   ZINC PO Take 1 tablet by  mouth daily.        Allergies  Allergen Reactions   Lisinopril     Cough D/Ced by Dr Sherene Sires   Codeine     nausea   Verapamil     Pain in feet    Consultations: None   Procedures/Studies: CT ABDOMEN PELVIS W CONTRAST  Result Date: 05/21/2023 CLINICAL DATA:  Acute abdominal pain EXAM: CT ABDOMEN AND PELVIS WITH CONTRAST TECHNIQUE: Multidetector CT imaging of the abdomen and pelvis was performed using the standard protocol following bolus administration of intravenous contrast. RADIATION DOSE REDUCTION: This exam was performed according to the departmental dose-optimization program which includes automated exposure control, adjustment of the mA and/or kV according to patient size and/or use of iterative reconstruction technique. CONTRAST:  OMNIPAQUE IOHEXOL 300 MG/ML  SOLN COMPARISON:  10/18/2022 FINDINGS: Lower chest: Mild bronchial wall thickening and bronchiectasis is noted in the bases bilaterally stable from the prior exam. Hepatobiliary: No focal liver abnormality is seen. No gallstones, gallbladder wall thickening, or biliary dilatation. Pancreas: Unremarkable. No pancreatic ductal dilatation or surrounding inflammatory changes. Spleen: Normal in size without focal abnormality. Adrenals/Urinary Tract: Adrenal glands are within normal limits. Changes of prior left nephrectomy are noted. Right kidney is well visualized and within normal limits. No obstructive changes are seen. Bladder is partially distended. Stomach/Bowel: Scattered diverticular changes noted without evidence of diverticulitis. No obstructive or inflammatory changes are seen. The appendix is within normal limits. Small bowel and stomach are unremarkable. Vascular/Lymphatic: Aortic atherosclerosis. No enlarged abdominal or pelvic lymph nodes. Reproductive: Uterus and bilateral adnexa are unremarkable. Other: No free fluid noted.  No focal herniation is seen. Musculoskeletal: Right hip replacement is noted. Degenerative  changes of the thoracic spine are seen. No acute abnormality is noted. Chronic T12 compression fracture is seen. IMPRESSION: Diverticulosis without diverticulitis. Status post left nephrectomy. No acute abnormality noted. Electronically Signed   By: Alcide Clever M.D.   On: 05/21/2023 21:40   (Echo, Carotid, EGD, Colonoscopy, ERCP)    Subjective: No complaints  Discharge Exam: Vitals:   05/26/23 0510 05/26/23 0903  BP: 132/86   Pulse: 79   Resp: 18   Temp: 97.8 F (36.6 C)   SpO2: 96% 98%   Vitals:   05/25/23 2203 05/26/23 0340 05/26/23 0510 05/26/23 0903  BP: (!) 147/97  132/86   Pulse: 90  79   Resp: 18  18   Temp: 97.7 F (36.5 C)  97.8 F (36.6 C)   TempSrc: Oral  Oral   SpO2: 95% 94% 96% 98%  Weight:      Height:        General: Pt is alert, awake, not in acute distress Cardiovascular: RRR, S1/S2 +, no rubs, no gallops Respiratory: CTA bilaterally, no wheezing, no rhonchi Abdominal: Soft, NT, ND, bowel sounds + Extremities: no edema, no cyanosis    The results of significant diagnostics from this hospitalization (including imaging, microbiology, ancillary and laboratory) are listed below for reference.     Microbiology: Recent Results (from the past 240 hour(s))  SARS Coronavirus 2 by RT PCR (hospital order, performed in [facility] hospital lab) *cepheid single result test* Anterior Nasal Swab     Status: None   Collection Time: 05/21/23  5:23 PM   Specimen: Anterior Nasal Swab  Result Value Ref Range Status   SARS Coronavirus 2 by RT PCR NEGATIVE NEGATIVE Final    Comment: (NOTE) SARS-CoV-2 target nucleic acids are NOT DETECTED.  The SARS-CoV-2 RNA is generally detectable in upper and lower respiratory specimens during the acute phase of infection. The lowest concentration of SARS-CoV-2 viral copies this assay can detect is 250 copies / mL. A negative result does not preclude SARS-CoV-2 infection and should not be used as the sole basis for treatment or  other patient management decisions.  A negative result may occur with improper specimen collection / handling, submission of specimen other than nasopharyngeal swab, presence of viral mutation(s) within the areas targeted by this assay, and inadequate number of viral copies (<250 copies / mL). A negative result must be combined with clinical observations, patient history, and epidemiological information.  Fact Sheet for Patients:   RoadLapTop.co.za  Fact Sheet for Healthcare Providers: http://kim-miller.com/  This test is not yet approved or  cleared by the [place] FDA and has been authorized for detection and/or diagnosis of SARS-CoV-2 by FDA under an Emergency Use Authorization (EUA).  This EUA will remain in effect (meaning this test can be used) for the duration of the COVID-19 declaration under Section 564(b)(1) of the Act, 21 U.S.C. section 360bbb-3(b)(1), unless the authorization is terminated or revoked sooner.  Performed at [facility], [address], [place], [place] [address]   Urine Culture     Status: Abnormal   Collection Time: 05/21/23  8:33 PM   Specimen: Urine, Clean Catch  Result Value Ref Range Status   Specimen Description   Final    URINE, CLEAN CATCH Performed at [facility], [address] [place], [place] [address]    Special Requests   Final    NONE Performed at [facility], [address], [place], [place] [address]    Culture >=100,000 COLONIES/mL ESCHERICHIA COLI (A)  Final   Report Status 05/24/2023 FINAL  Final   Organism ID, Bacteria ESCHERICHIA COLI (A)  Final      Susceptibility   Escherichia coli - MIC*    AMPICILLIN >=32 RESISTANT Resistant     CEFAZOLIN <=4 SENSITIVE Sensitive     CEFEPIME <=0.12 SENSITIVE Sensitive     CEFTRIAXONE <=0.25 SENSITIVE Sensitive     CIPROFLOXACIN <=0.25 SENSITIVE Sensitive     GENTAMICIN <=1  SENSITIVE Sensitive     IMIPENEM <=0.25 SENSITIVE Sensitive     NITROFURANTOIN <=16 SENSITIVE Sensitive     TRIMETH/SULFA <=20 SENSITIVE Sensitive     AMPICILLIN/SULBACTAM 16 INTERMEDIATE Intermediate     PIP/TAZO <=4 SENSITIVE Sensitive ug/mL    * >=100,000 COLONIES/mL ESCHERICHIA COLI     Labs: BNP (last 3 results) No results for input(s): "BNP" in the last 8760 hours. Basic Metabolic Panel: Recent Labs  Lab 05/21/23 1723 05/22/23 0438  NA 140 139  K 3.7 3.7  CL 105 105  CO2 25 26  GLUCOSE 181* 122*  BUN 23 16  CREATININE 0.80 0.62  CALCIUM 9.0 8.9  MG  --  2.1  PHOS  --  3.1   Liver Function Tests: Recent Labs  Lab 05/21/23 1723  AST 22  ALT 16  ALKPHOS 67  BILITOT 0.7  PROT 7.5  ALBUMIN 4.1   Recent Labs  Lab 05/21/23 1723  LIPASE 70*   No results for input(s): "AMMONIA" in the last 168 hours. CBC: Recent Labs  Lab 05/21/23 1723 05/22/23 0438  WBC 9.2 10.1  NEUTROABS 7.3  --   HGB 15.6* 14.1  HCT 48.2* 42.4  MCV 92.0 91.0  PLT 281 271   Cardiac Enzymes: No results for input(s): "CKTOTAL", "CKMB", "CKMBINDEX", "TROPONINI" in the last 168 hours. BNP: Invalid input(s): "POCBNP" CBG: Recent Labs  Lab 05/25/23 0755 05/25/23 1140 05/25/23 1632 05/25/23 2201 05/26/23 0727  GLUCAP 120* 122* 107* 111* 145*   D-Dimer No results for input(s): "DDIMER" in the last 72 hours. Hgb A1c No results for input(s): "HGBA1C" in the last 72 hours. Lipid Profile No results for input(s): "CHOL", "HDL", "LDLCALC", "TRIG", "CHOLHDL", "LDLDIRECT" in the last 72 hours. Thyroid function studies No results for input(s): "TSH", "T4TOTAL", "T3FREE", "THYROIDAB" in the last 72 hours.  Invalid input(s): "FREET3" Anemia work up No results for input(s): "VITAMINB12", "FOLATE", "FERRITIN", "TIBC", "IRON", "RETICCTPCT" in the last 72 hours. Urinalysis    Component Value Date/Time   COLORURINE STRAW (A) 05/22/2023 0100   APPEARANCEUR CLEAR 05/22/2023 0100   LABSPEC  1.018 05/22/2023 0100   PHURINE 7.0 05/22/2023 0100   GLUCOSEU NEGATIVE 05/22/2023 0100   HGBUR SMALL (A) 05/22/2023 0100   HGBUR negative 08/02/2009 1512   BILIRUBINUR NEGATIVE 05/22/2023 0100   BILIRUBINUR negative 08/25/2019 1154   BILIRUBINUR Negative 12/16/2017 1115   KETONESUR NEGATIVE 05/22/2023 0100   PROTEINUR NEGATIVE 05/22/2023 0100   UROBILINOGEN 0.2 08/25/2019 1154   UROBILINOGEN 0.2 12/12/2014 1623   NITRITE POSITIVE (A) 05/22/2023 0100   LEUKOCYTESUR LARGE (A) 05/22/2023 0100   Sepsis Labs Recent Labs  Lab 05/21/23 1723 05/22/23 0438  WBC 9.2 10.1   Microbiology Recent Results (from the past 240 hour(s))  SARS Coronavirus 2 by RT PCR (hospital order, performed in [facility] hospital lab) *cepheid single result test* Anterior Nasal Swab     Status: None   Collection Time: 05/21/23  5:23 PM   Specimen: Anterior Nasal Swab  Result Value Ref Range Status   SARS Coronavirus 2 by RT PCR NEGATIVE NEGATIVE Final    Comment: (NOTE) SARS-CoV-2 target nucleic acids are NOT DETECTED.  The SARS-CoV-2 RNA is generally detectable in upper and lower respiratory specimens during the acute phase of infection. The lowest concentration of SARS-CoV-2 viral copies this assay can detect is 250 copies / mL. A negative result does not preclude SARS-CoV-2 infection and should not be used as the sole basis for treatment or other patient management decisions.  A negative result may occur with improper specimen collection / handling, submission of specimen other than nasopharyngeal swab, presence of viral mutation(s) within the areas targeted by this assay, and inadequate number of viral copies (<250 copies / mL). A negative result must be combined with clinical observations, patient history, and epidemiological information.  Fact Sheet for Patients:   RoadLapTop.co.za  Fact Sheet for Healthcare Providers: http://kim-miller.com/  This  test is not yet approved or  cleared by the [place] FDA and has been authorized for detection and/or diagnosis of SARS-CoV-2 by FDA under an Emergency Use Authorization (EUA).  This EUA will remain in effect (meaning this test can be used) for the duration of the COVID-19 declaration under Section 564(b)(1) of the Act, 21 U.S.C. section 360bbb-3(b)(1), unless the authorization is terminated or revoked sooner.  Performed at [facility], [address], [place], [place] [address]   Urine Culture     Status: Abnormal   Collection Time: 05/21/23  8:33 PM   Specimen: Urine, Clean Catch  Result Value Ref Range Status   Specimen Description   Final    URINE, CLEAN CATCH Performed at [facility], [address], [place], [place] [address]    Special Requests   Final    NONE Performed at [facility], [address] [place], [place] [address]    Culture >=100,000 COLONIES/mL ESCHERICHIA COLI (A)  Final   Report Status 05/24/2023 FINAL  Final   Organism ID, Bacteria ESCHERICHIA COLI (A)  Final      Susceptibility   Escherichia coli - MIC*    AMPICILLIN >=32 RESISTANT Resistant     CEFAZOLIN <=4 SENSITIVE Sensitive     CEFEPIME <=0.12 SENSITIVE Sensitive     CEFTRIAXONE <=0.25 SENSITIVE Sensitive     CIPROFLOXACIN <=0.25 SENSITIVE Sensitive     GENTAMICIN <=1 SENSITIVE Sensitive     IMIPENEM <=0.25 SENSITIVE Sensitive     NITROFURANTOIN <=16 SENSITIVE Sensitive     TRIMETH/SULFA <=20 SENSITIVE Sensitive     AMPICILLIN/SULBACTAM 16 INTERMEDIATE Intermediate     PIP/TAZO <=4 SENSITIVE Sensitive ug/mL    * >=100,000 COLONIES/mL ESCHERICHIA COLI     Time coordinating discharge: Over 35 minutes  SIGNED:   Marinda Elk, MD  [facility] 05/26/2023, 11:27 AM Pager   If 7PM-7AM, please contact night-coverage [email or web address] Password [email or web address]

## 2023-05-27 DIAGNOSIS — N1831 Chronic kidney disease, stage 3a: Secondary | ICD-10-CM | POA: Diagnosis not present

## 2023-05-27 DIAGNOSIS — R112 Nausea with vomiting, unspecified: Secondary | ICD-10-CM | POA: Diagnosis not present

## 2023-05-27 DIAGNOSIS — R7989 Other specified abnormal findings of blood chemistry: Secondary | ICD-10-CM | POA: Diagnosis not present

## 2023-05-27 DIAGNOSIS — N3000 Acute cystitis without hematuria: Secondary | ICD-10-CM | POA: Diagnosis not present

## 2023-05-27 LAB — GLUCOSE, CAPILLARY: Glucose-Capillary: 151 mg/dL — ABNORMAL HIGH (ref 70–99)

## 2023-05-27 MED ORDER — CARMEX CLASSIC LIP BALM EX OINT
1.0000 | TOPICAL_OINTMENT | CUTANEOUS | Status: DC | PRN
  Administered 2023-05-27: 1 via TOPICAL
  Filled 2023-05-27: qty 10

## 2023-05-27 NOTE — Discharge Summary (Addendum)
Physician Discharge Summary  Alexa [name] WUJ:[id number] DOB: 19-Apr-1930 DOA: 05/21/2023  PCP: Charlane Ferretti, DO  Admit date: 05/21/2023 Discharge date: 05/27/2023  Admitted From: Home Disposition:  Home  Recommendations for Outpatient Follow-up:  Follow up with PCP in 1-2 weeks Please obtain BMP/CBC in one week Patient will be discharged with home hospice.  Will resume home to hospice as an outpatient.   Home Health:No Equipment/Devices:None  Discharge Condition:Stable CODE STATUS:DNR Diet recommendation: Heart Healthy  Brief/Interim Summary: 89 y.o. female past medical history of COPD, essential hypertension renal cell carcinoma with left-sided nephrectomy, spinal stenosis, recurrent UTIs comes in for acute nausea vomiting and presyncopal episode, associated with suprapubic abdominal pain diaphoresis.   Discharge Diagnoses:  Principal Problem:   Urinary tract infection Active Problems:   Elevated troponin   Essential hypertension   Stage 3a chronic kidney disease (CKD) (HCC)   UTI (urinary tract infection)   Near syncope   Constipation  Near syncopal episode, with nausea and vomiting and suprapubic pain: Possibly vasovagal as she was straining to have a bowel movement when she started to feel lightheaded. Cardiac biomarkers have been flat symptoms are now resolved. PT OT evaluated the patient recommended skilled.  We patient refused to go home with hospice.  Possible UTI: Urine culture grew E. coli, she was started on admission on IV antibiotics now transition to oral Keflex which she will continue as an outpatient.  Elevated troponins: She denies any chest pain cardiac biomarkers have basically remained flat, elevated likely due to demand ischemia.  Essential hypertension: No changes made to her medication.  Chronic constipation: She was started on MiraLAX and sorbitol this resolved for constipation.  Chronic kidney disease stage IIIa: His creatinine  remained at baseline.  Goals of care: She will go home with hospice she does not want to go to skilled nursing facility.  Discharge Instructions  Discharge Instructions     Diet - low sodium heart healthy   Complete by: As directed    Increase activity slowly   Complete by: As directed       Allergies as of 05/27/2023       Reactions   Lisinopril    Cough D/Ced by Dr Sherene Sires   Codeine    nausea   Verapamil    Pain in feet        Medication List     STOP taking these medications    HYDROcodone-acetaminophen 5-325 MG tablet Commonly known as: NORCO/VICODIN       TAKE these medications    acetaminophen 500 MG tablet Commonly known as: TYLENOL Take 500-1,000 mg by mouth every 6 (six) hours as needed for moderate pain (pain score 4-6).   albuterol (2.5 MG/3ML) 0.083% nebulizer solution Commonly known as: PROVENTIL Take 2.5 mg by nebulization every 4 (four) hours as needed for wheezing or shortness of breath.   amLODipine 5 MG tablet Commonly known as: NORVASC Take 2 tablets (10 mg total) by mouth daily. What changed: how much to take   cephALEXin 500 MG capsule Commonly known as: KEFLEX Take 1 capsule (500 mg total) by mouth every 8 (eight) hours for 3 days.   CRANBERRY PO Take 2 tablets by mouth 2 (two) times daily.   DIABETIC BASICS HEALTHY FOOT EX Apply 1 application  topically at bedtime.   ESTRACE VAGINAL 0.1 MG/GM vaginal cream Generic drug: estradiol Apply locally every other night at bedtime   fluticasone 50 MCG/ACT nasal spray Commonly known as: FLONASE Place 1 spray into  both nostrils daily.   hydrOXYzine 10 MG tablet Commonly known as: ATARAX Take 10 mg by mouth daily.   RED WINE COMPLEX PO Take 1 tablet by mouth daily.   sitaGLIPtin 50 MG tablet Commonly known as: JANUVIA Take 50 mg by mouth daily as needed.   VITAMIN B 12 PO Take 1 Dose by mouth daily.   Vitamin D (Ergocalciferol) 1.25 MG (50000 UNIT) Caps capsule Commonly  known as: DRISDOL Take 50,000 Units by mouth once a week.   VITAMIN D PO Take 1 tablet by mouth daily.   ZINC PO Take 1 tablet by mouth daily.         Contact information for after-discharge care     Destination     [facility] Preferred SNF .   Service: Skilled Nursing Contact information: [address] [place] [address] [phone]                    Allergies  Allergen Reactions   Lisinopril     Cough D/Ced by Dr Sherene Sires   Codeine     nausea   Verapamil     Pain in feet    Consultations: None   Procedures/Studies: CT ABDOMEN PELVIS W CONTRAST  Result Date: 05/21/2023 CLINICAL DATA:  Acute abdominal pain EXAM: CT ABDOMEN AND PELVIS WITH CONTRAST TECHNIQUE: Multidetector CT imaging of the abdomen and pelvis was performed using the standard protocol following bolus administration of intravenous contrast. RADIATION DOSE REDUCTION: This exam was performed according to the departmental dose-optimization program which includes automated exposure control, adjustment of the mA and/or kV according to patient size and/or use of iterative reconstruction technique. CONTRAST:  OMNIPAQUE IOHEXOL 300 MG/ML  SOLN COMPARISON:  10/18/2022 FINDINGS: Lower chest: Mild bronchial wall thickening and bronchiectasis is noted in the bases bilaterally stable from the prior exam. Hepatobiliary: No focal liver abnormality is seen. No gallstones, gallbladder wall thickening, or biliary dilatation. Pancreas: Unremarkable. No pancreatic ductal dilatation or surrounding inflammatory changes. Spleen: Normal in size without focal abnormality. Adrenals/Urinary Tract: Adrenal glands are within normal limits. Changes of prior left nephrectomy are noted. Right kidney is well visualized and within normal limits. No obstructive changes are seen. Bladder is partially distended. Stomach/Bowel: Scattered diverticular changes noted without evidence of  diverticulitis. No obstructive or inflammatory changes are seen. The appendix is within normal limits. Small bowel and stomach are unremarkable. Vascular/Lymphatic: Aortic atherosclerosis. No enlarged abdominal or pelvic lymph nodes. Reproductive: Uterus and bilateral adnexa are unremarkable. Other: No free fluid noted.  No focal herniation is seen. Musculoskeletal: Right hip replacement is noted. Degenerative changes of the thoracic spine are seen. No acute abnormality is noted. Chronic T12 compression fracture is seen. IMPRESSION: Diverticulosis without diverticulitis. Status post left nephrectomy. No acute abnormality noted. Electronically Signed   By: Alcide Clever M.D.   On: 05/21/2023 21:40   (Echo, Carotid, EGD, Colonoscopy, ERCP)    Subjective: No complaints  Discharge Exam: Vitals:   05/26/23 2126 05/27/23 0551  BP: 134/85 (!) 157/92  Pulse: 82 70  Resp: 18 18  Temp: 98 F (36.7 C) 97.9 F (36.6 C)  SpO2: 95% 95%   Vitals:   05/26/23 1251 05/26/23 1428 05/26/23 2126 05/27/23 0551  BP: 134/82 (!) 144/88 134/85 (!) 157/92  Pulse: 90 91 82 70  Resp: 16 16 18 18   Temp: (!) 97.5 F (36.4 C) (!) 97.5 F (36.4 C) 98 F (36.7 C) 97.9 F (36.6 C)  TempSrc: Oral Oral Oral  SpO2: 95% 94% 95% 95%  Weight:      Height:        General: Pt is alert, awake, not in acute distress Cardiovascular: RRR, S1/S2 +, no rubs, no gallops Respiratory: CTA bilaterally, no wheezing, no rhonchi Abdominal: Soft, NT, ND, bowel sounds + Extremities: no edema, no cyanosis    The results of significant diagnostics from this hospitalization (including imaging, microbiology, ancillary and laboratory) are listed below for reference.     Microbiology: Recent Results (from the past 240 hour(s))  SARS Coronavirus 2 by RT PCR (hospital order, performed in [facility] hospital lab) *cepheid single result test* Anterior Nasal Swab     Status: None   Collection Time: 05/21/23  5:23 PM   Specimen:  Anterior Nasal Swab  Result Value Ref Range Status   SARS Coronavirus 2 by RT PCR NEGATIVE NEGATIVE Final    Comment: (NOTE) SARS-CoV-2 target nucleic acids are NOT DETECTED.  The SARS-CoV-2 RNA is generally detectable in upper and lower respiratory specimens during the acute phase of infection. The lowest concentration of SARS-CoV-2 viral copies this assay can detect is 250 copies / mL. A negative result does not preclude SARS-CoV-2 infection and should not be used as the sole basis for treatment or other patient management decisions.  A negative result may occur with improper specimen collection / handling, submission of specimen other than nasopharyngeal swab, presence of viral mutation(s) within the areas targeted by this assay, and inadequate number of viral copies (<250 copies / mL). A negative result must be combined with clinical observations, patient history, and epidemiological information.  Fact Sheet for Patients:   RoadLapTop.co.za  Fact Sheet for Healthcare Providers: http://kim-miller.com/  This test is not yet approved or  cleared by the [place] FDA and has been authorized for detection and/or diagnosis of SARS-CoV-2 by FDA under an Emergency Use Authorization (EUA).  This EUA will remain in effect (meaning this test can be used) for the duration of the COVID-19 declaration under Section 564(b)(1) of the Act, 21 U.S.C. section 360bbb-3(b)(1), unless the authorization is terminated or revoked sooner.  Performed at [facility], [address] [place], [place] [address]   Urine Culture     Status: Abnormal   Collection Time: 05/21/23  8:33 PM   Specimen: Urine, Clean Catch  Result Value Ref Range Status   Specimen Description   Final    URINE, CLEAN CATCH Performed at [facility], [address] [place], [place] [address]    Special Requests   Final    NONE Performed  at [facility], [address], [place], [place] [address]    Culture >=100,000 COLONIES/mL ESCHERICHIA COLI (A)  Final   Report Status 05/24/2023 FINAL  Final   Organism ID, Bacteria ESCHERICHIA COLI (A)  Final      Susceptibility   Escherichia coli - MIC*    AMPICILLIN >=32 RESISTANT Resistant     CEFAZOLIN <=4 SENSITIVE Sensitive     CEFEPIME <=0.12 SENSITIVE Sensitive     CEFTRIAXONE <=0.25 SENSITIVE Sensitive     CIPROFLOXACIN <=0.25 SENSITIVE Sensitive     GENTAMICIN <=1 SENSITIVE Sensitive     IMIPENEM <=0.25 SENSITIVE Sensitive     NITROFURANTOIN <=16 SENSITIVE Sensitive     TRIMETH/SULFA <=20 SENSITIVE Sensitive     AMPICILLIN/SULBACTAM 16 INTERMEDIATE Intermediate     PIP/TAZO <=4 SENSITIVE Sensitive ug/mL    * >=100,000 COLONIES/mL ESCHERICHIA COLI     Labs: BNP (  last 3 results) No results for input(s): "BNP" in the last 8760 hours. Basic Metabolic Panel: Recent Labs  Lab 05/21/23 1723 05/22/23 0438  NA 140 139  K 3.7 3.7  CL 105 105  CO2 25 26  GLUCOSE 181* 122*  BUN 23 16  CREATININE 0.80 0.62  CALCIUM 9.0 8.9  MG  --  2.1  PHOS  --  3.1   Liver Function Tests: Recent Labs  Lab 05/21/23 1723  AST 22  ALT 16  ALKPHOS 67  BILITOT 0.7  PROT 7.5  ALBUMIN 4.1   Recent Labs  Lab 05/21/23 1723  LIPASE 70*   No results for input(s): "AMMONIA" in the last 168 hours. CBC: Recent Labs  Lab 05/21/23 1723 05/22/23 0438  WBC 9.2 10.1  NEUTROABS 7.3  --   HGB 15.6* 14.1  HCT 48.2* 42.4  MCV 92.0 91.0  PLT 281 271   Cardiac Enzymes: No results for input(s): "CKTOTAL", "CKMB", "CKMBINDEX", "TROPONINI" in the last 168 hours. BNP: Invalid input(s): "POCBNP" CBG: Recent Labs  Lab 05/26/23 0727 05/26/23 1130 05/26/23 1649 05/26/23 2124 05/27/23 0718  GLUCAP 145* 243* 127* 144* 151*   D-Dimer No results for input(s): "DDIMER" in the last 72 hours. Hgb A1c No results for input(s): "HGBA1C" in the last 72 hours. Lipid  Profile No results for input(s): "CHOL", "HDL", "LDLCALC", "TRIG", "CHOLHDL", "LDLDIRECT" in the last 72 hours. Thyroid function studies No results for input(s): "TSH", "T4TOTAL", "T3FREE", "THYROIDAB" in the last 72 hours.  Invalid input(s): "FREET3" Anemia work up No results for input(s): "VITAMINB12", "FOLATE", "FERRITIN", "TIBC", "IRON", "RETICCTPCT" in the last 72 hours. Urinalysis    Component Value Date/Time   COLORURINE STRAW (A) 05/22/2023 0100   APPEARANCEUR CLEAR 05/22/2023 0100   LABSPEC 1.018 05/22/2023 0100   PHURINE 7.0 05/22/2023 0100   GLUCOSEU NEGATIVE 05/22/2023 0100   HGBUR SMALL (A) 05/22/2023 0100   HGBUR negative 08/02/2009 1512   BILIRUBINUR NEGATIVE 05/22/2023 0100   BILIRUBINUR negative 08/25/2019 1154   BILIRUBINUR Negative 12/16/2017 1115   KETONESUR NEGATIVE 05/22/2023 0100   PROTEINUR NEGATIVE 05/22/2023 0100   UROBILINOGEN 0.2 08/25/2019 1154   UROBILINOGEN 0.2 12/12/2014 1623   NITRITE POSITIVE (A) 05/22/2023 0100   LEUKOCYTESUR LARGE (A) 05/22/2023 0100   Sepsis Labs Recent Labs  Lab 05/21/23 1723 05/22/23 0438  WBC 9.2 10.1   Microbiology Recent Results (from the past 240 hour(s))  SARS Coronavirus 2 by RT PCR (hospital order, performed in [facility] hospital lab) *cepheid single result test* Anterior Nasal Swab     Status: None   Collection Time: 05/21/23  5:23 PM   Specimen: Anterior Nasal Swab  Result Value Ref Range Status   SARS Coronavirus 2 by RT PCR NEGATIVE NEGATIVE Final    Comment: (NOTE) SARS-CoV-2 target nucleic acids are NOT DETECTED.  The SARS-CoV-2 RNA is generally detectable in upper and lower respiratory specimens during the acute phase of infection. The lowest concentration of SARS-CoV-2 viral copies this assay can detect is 250 copies / mL. A negative result does not preclude SARS-CoV-2 infection and should not be used as the sole basis for treatment or other patient management decisions.  A negative result may  occur with improper specimen collection / handling, submission of specimen other than nasopharyngeal swab, presence of viral mutation(s) within the areas targeted by this assay, and inadequate number of viral copies (<250 copies / mL). A negative result must be combined with clinical observations, patient history, and epidemiological information.  Fact Sheet  for Patients:   RoadLapTop.co.za  Fact Sheet for Healthcare Providers: http://kim-miller.com/  This test is not yet approved or  cleared by the [place] FDA and has been authorized for detection and/or diagnosis of SARS-CoV-2 by FDA under an Emergency Use Authorization (EUA).  This EUA will remain in effect (meaning this test can be used) for the duration of the COVID-19 declaration under Section 564(b)(1) of the Act, 21 U.S.C. section 360bbb-3(b)(1), unless the authorization is terminated or revoked sooner.  Performed at [facility], [address], [place], [place] [address]   Urine Culture     Status: Abnormal   Collection Time: 05/21/23  8:33 PM   Specimen: Urine, Clean Catch  Result Value Ref Range Status   Specimen Description   Final    URINE, CLEAN CATCH Performed at [facility], [address], [place], [place] [address]    Special Requests   Final    NONE Performed at [facility], [address], [place], [place] [address]    Culture >=100,000 COLONIES/mL ESCHERICHIA COLI (A)  Final   Report Status 05/24/2023 FINAL  Final   Organism ID, Bacteria ESCHERICHIA COLI (A)  Final      Susceptibility   Escherichia coli - MIC*    AMPICILLIN >=32 RESISTANT Resistant     CEFAZOLIN <=4 SENSITIVE Sensitive     CEFEPIME <=0.12 SENSITIVE Sensitive     CEFTRIAXONE <=0.25 SENSITIVE Sensitive     CIPROFLOXACIN <=0.25 SENSITIVE Sensitive     GENTAMICIN <=1 SENSITIVE Sensitive     IMIPENEM <=0.25 SENSITIVE Sensitive      NITROFURANTOIN <=16 SENSITIVE Sensitive     TRIMETH/SULFA <=20 SENSITIVE Sensitive     AMPICILLIN/SULBACTAM 16 INTERMEDIATE Intermediate     PIP/TAZO <=4 SENSITIVE Sensitive ug/mL    * >=100,000 COLONIES/mL ESCHERICHIA COLI     Time coordinating discharge: Over 35 minutes  SIGNED:   Marinda Elk, MD  [facility] 05/27/2023, 9:02 AM Pager   If 7PM-7AM, please contact night-coverage [email or web address] Password [email or web address]

## 2023-05-27 NOTE — Plan of Care (Signed)
  Problem: Coping: Goal: Ability to adjust to condition or change in health will improve Outcome: Progressing   Problem: Fluid Volume: Goal: Ability to maintain a balanced intake and output will improve Outcome: Progressing

## 2023-05-27 NOTE — TOC Transition Note (Signed)
Transition of Care [facility]) - CM/SW Discharge Note   Patient Details  Name: Alexa [name] MRN: [id number] Date of Birth: 06/04/1930  [facility] CM/SW Contact:  Amada Jupiter, LCSW Phone Number: 05/27/2023, 10:30 AM   Clinical Narrative:    Received contact from pt's insurance this morning reporting that pt is not eligible for SNF coverage due to having just unenrolled from Hospice care this month - not eligible until Nov. 1. Have spoken with pt and alerted MD/RN - pt states understanding and plans for dc home today.  She would like to restart the home hospice services with [facility] and I have alerted the liaison who will have services resume this week.  Pt feels her mobility is much improved.  Her family can provide dc transportation and will be checking in with her daily. Has needed DME in the home.  No further TOC needs.   Final next level of care: Home w Hospice Care Barriers to Discharge: Barriers Resolved   Patient Goals and CMS Choice CMS Medicare.gov Compare Post Acute Care list provided to:: Patient Choice offered to / list presented to : Patient  Discharge Placement                         Discharge Plan and Services Additional resources added to the After Visit Summary for     Discharge Planning Services: CM Consult Post Acute Care Choice: Skilled Nursing Facility          DME Arranged: N/A DME Agency: NA       HH Arranged: Nurse's Aide, Charity fundraiser, Social Work [employer] Agency: Other - See comment (resume services with [facility])        Social Determinants of Health (SDOH) Interventions SDOH Screenings   Food Insecurity: No Food Insecurity (05/23/2023)  Housing: Patient Declined (05/23/2023)  Transportation Needs: No Transportation Needs (05/23/2023)  Utilities: Not At Risk (05/23/2023)  Alcohol Screen: Low Risk  (04/03/2018)  Depression (PHQ2-9): Low Risk  (04/07/2020)  Financial Resource Strain: Low Risk  (08/08/2020)  Tobacco Use: Low Risk   (05/22/2023)     Readmission Risk Interventions    05/23/2023   11:53 AM 02/17/2023   10:42 AM 12/17/2021    1:08 PM  Readmission Risk Prevention Plan  Post Dischage Appt  Complete   Medication Screening  Complete   Transportation Screening Complete Complete Complete  PCP or Specialist Appt within 5-7 Days Complete  Complete  Home Care Screening Complete  Complete  Medication Review (RN CM) Complete  Complete

## 2023-05-27 NOTE — Progress Notes (Signed)
AVS reviewed w/ patient who verbalized an understanding - no other questions at this time. PIV  removed as documented. Pt has called her brother to pick her up at the main entrance.

## 2023-05-28 DIAGNOSIS — K5900 Constipation, unspecified: Secondary | ICD-10-CM | POA: Diagnosis not present

## 2023-05-28 DIAGNOSIS — N1831 Chronic kidney disease, stage 3a: Secondary | ICD-10-CM | POA: Diagnosis not present

## 2023-05-28 DIAGNOSIS — I10 Essential (primary) hypertension: Secondary | ICD-10-CM | POA: Diagnosis not present

## 2023-05-28 DIAGNOSIS — D6859 Other primary thrombophilia: Secondary | ICD-10-CM | POA: Diagnosis not present

## 2023-05-28 DIAGNOSIS — F0390 Unspecified dementia without behavioral disturbance: Secondary | ICD-10-CM | POA: Diagnosis not present

## 2023-05-28 DIAGNOSIS — M810 Age-related osteoporosis without current pathological fracture: Secondary | ICD-10-CM | POA: Diagnosis not present

## 2023-05-28 DIAGNOSIS — E1122 Type 2 diabetes mellitus with diabetic chronic kidney disease: Secondary | ICD-10-CM | POA: Diagnosis not present

## 2023-05-28 DIAGNOSIS — R2681 Unsteadiness on feet: Secondary | ICD-10-CM | POA: Diagnosis not present

## 2023-05-28 DIAGNOSIS — M549 Dorsalgia, unspecified: Secondary | ICD-10-CM | POA: Diagnosis not present

## 2023-06-12 DIAGNOSIS — M879 Osteonecrosis, unspecified: Secondary | ICD-10-CM | POA: Diagnosis not present

## 2023-06-12 DIAGNOSIS — E441 Mild protein-calorie malnutrition: Secondary | ICD-10-CM | POA: Diagnosis not present

## 2023-06-12 DIAGNOSIS — B9620 Unspecified Escherichia coli [E. coli] as the cause of diseases classified elsewhere: Secondary | ICD-10-CM | POA: Diagnosis not present

## 2023-06-12 DIAGNOSIS — E1122 Type 2 diabetes mellitus with diabetic chronic kidney disease: Secondary | ICD-10-CM | POA: Diagnosis not present

## 2023-06-12 DIAGNOSIS — F0394 Unspecified dementia, unspecified severity, with anxiety: Secondary | ICD-10-CM | POA: Diagnosis not present

## 2023-06-12 DIAGNOSIS — N390 Urinary tract infection, site not specified: Secondary | ICD-10-CM | POA: Diagnosis not present

## 2023-06-12 DIAGNOSIS — F0393 Unspecified dementia, unspecified severity, with mood disturbance: Secondary | ICD-10-CM | POA: Diagnosis not present

## 2023-06-12 DIAGNOSIS — I129 Hypertensive chronic kidney disease with stage 1 through stage 4 chronic kidney disease, or unspecified chronic kidney disease: Secondary | ICD-10-CM | POA: Diagnosis not present

## 2023-06-12 DIAGNOSIS — E1140 Type 2 diabetes mellitus with diabetic neuropathy, unspecified: Secondary | ICD-10-CM | POA: Diagnosis not present

## 2023-06-16 DIAGNOSIS — E1122 Type 2 diabetes mellitus with diabetic chronic kidney disease: Secondary | ICD-10-CM | POA: Diagnosis not present

## 2023-06-16 DIAGNOSIS — F0393 Unspecified dementia, unspecified severity, with mood disturbance: Secondary | ICD-10-CM | POA: Diagnosis not present

## 2023-06-16 DIAGNOSIS — F0394 Unspecified dementia, unspecified severity, with anxiety: Secondary | ICD-10-CM | POA: Diagnosis not present

## 2023-06-16 DIAGNOSIS — M879 Osteonecrosis, unspecified: Secondary | ICD-10-CM | POA: Diagnosis not present

## 2023-06-16 DIAGNOSIS — E441 Mild protein-calorie malnutrition: Secondary | ICD-10-CM | POA: Diagnosis not present

## 2023-06-16 DIAGNOSIS — E1140 Type 2 diabetes mellitus with diabetic neuropathy, unspecified: Secondary | ICD-10-CM | POA: Diagnosis not present

## 2023-06-16 DIAGNOSIS — I129 Hypertensive chronic kidney disease with stage 1 through stage 4 chronic kidney disease, or unspecified chronic kidney disease: Secondary | ICD-10-CM | POA: Diagnosis not present

## 2023-06-16 DIAGNOSIS — B9620 Unspecified Escherichia coli [E. coli] as the cause of diseases classified elsewhere: Secondary | ICD-10-CM | POA: Diagnosis not present

## 2023-06-16 DIAGNOSIS — N390 Urinary tract infection, site not specified: Secondary | ICD-10-CM | POA: Diagnosis not present

## 2023-06-18 ENCOUNTER — Encounter: Payer: Self-pay | Admitting: Cardiology

## 2023-06-18 DIAGNOSIS — R0602 Shortness of breath: Secondary | ICD-10-CM | POA: Insufficient documentation

## 2023-06-18 NOTE — Progress Notes (Unsigned)
Cardiology Office Note   Date:  06/19/2023   ID:  Alexa [name], DOB Dec 24, 1929, MRN [id number]  PCP:  Alexa Ferretti, DO  Cardiologist:   None Referring:  Alexa Ferretti, DO  No chief complaint on file.     History of Present Illness: Alexa [name] is a 89 y.o. female who presents for evaluation of SOB. She was in the hospital last month most recently.  She had UTI, weakness and CKDIIIa.  The plan was for her to go home with hospice.     I saw her in 2012.  She had a fall but not syncope which was the question.  She had an echo in 2018 for evaluation of SOB.  There was moderate concentric LVH with normal EF.     She says she is back because she was noted to have some apparent arrhythmias when they were seeing her in her primary care office.  I do not have notes from this but I do see that she had some ectopy on her EKG today.  She does not really feel this.  She does not have any presyncope or syncope.  She gets her house on her rolling walker.  She is careful not to fall.  She does have fatigue.  However, she is not having any acute symptoms such as shortness of breath, PND or orthopnea.  She has had no new palpitations, presyncope or syncope.  She has had no weight gain or edema.  She does have a nurse coming to the house once a week.  I do note that when she was in the hospital most recently her troponin was elevated at 85 and 88 but there were no acute EKG changes and was thought to possibly be some demand ischemia.  Of note earlier this year her husband of 70 years died.  He was my patient.  He had progressive dementia.   Past Medical History:  Diagnosis Date   Angio-edema    Asthma    Chronic obstructive pulmonary disease (HCC)    Diverticulosis of colon (without mention of hemorrhage)    Eczema    Esophageal reflux    History of colonic polyps 06/05/2005   hyperplastic   Internal hemorrhoids    Irritable bowel syndrome    Ischemic colitis (HCC)    Malignant  neoplasm of kidney and other and unspecified urinary organs    renal carcinoma, left nephrectomy in 2008-per patient.   Other and unspecified hyperlipidemia    Personal history of venous thrombosis and embolism    PTE after nephrectomy   Spinal stenosis    Type II or unspecified type diabetes mellitus without mention of complication, not stated as uncontrolled 02/05/2012   pt states it was in 2007   Unspecified essential hypertension    Urticaria     Past Surgical History:  Procedure Laterality Date   ADENOIDECTOMY     BREAST BIOPSY     COLONOSCOPY W/ POLYPECTOMY  2000   COLONOSCOPY W/ POLYPECTOMY  07/2004   Hyperplastic polyps   DILATION AND CURETTAGE OF UTERUS  03/2005   Uterine Polyps   NEPHRECTOMY  2007   right,Alexa [name]   SEPTOPLASTY     TONSILLECTOMY     TOTAL HIP ARTHROPLASTY Right 12/16/2021   Procedure: TOTAL HIP ARTHROPLASTY ANTERIOR APPROACH;  Surgeon: Alexa Frederic, MD;  Location: [facility];  Service: Orthopedics;  Laterality: Right;     Current Outpatient Medications  Medication Sig Dispense Refill   acetaminophen (TYLENOL)  500 MG tablet Take 500-1,000 mg by mouth every 6 (six) hours as needed for moderate pain (pain score 4-6).     albuterol (PROVENTIL) (2.5 MG/3ML) 0.083% nebulizer solution Take 2.5 mg by nebulization every 4 (four) hours as needed for wheezing or shortness of breath.     amLODipine (NORVASC) 5 MG tablet Take 2 tablets (10 mg total) by mouth daily. (Patient taking differently: Take 5 mg by mouth daily.)     Benzalkonium Chloride (DIABETIC BASICS HEALTHY FOOT EX) Apply 1 application  topically at bedtime.     CRANBERRY PO Take 2 tablets by mouth 2 (two) times daily.     Cyanocobalamin (VITAMIN B 12 PO) Take 1 Dose by mouth daily.     ESTRACE VAGINAL 0.1 MG/GM vaginal cream Apply locally every other night at bedtime  1   fluticasone (FLONASE) 50 MCG/ACT nasal spray Place 1 spray into both nostrils daily.     hydrOXYzine (ATARAX) 10 MG tablet Take  10 mg by mouth daily.     Misc Natural Products (RED WINE COMPLEX PO) Take 1 tablet by mouth daily.     Multiple Vitamins-Minerals (ZINC PO) Take 1 tablet by mouth daily.     sitaGLIPtin (JANUVIA) 50 MG tablet Take 50 mg by mouth daily as needed.     VITAMIN D PO Take 1 tablet by mouth daily.     Vitamin D, Ergocalciferol, (DRISDOL) 1.25 MG (50000 UNIT) CAPS capsule Take 50,000 Units by mouth once a week.     No current facility-administered medications for this visit.    Allergies:   Lisinopril, Codeine, and Verapamil    Social History:  The patient  reports that she has never smoked. She has never used smokeless tobacco. She reports that she does not drink alcohol and does not use drugs.   Family History:  The patient's family history includes Alzheimer's disease in her sister; Aneurysm in her brother and mother; Bipolar disorder in her sister; COPD in her sister; Cancer in her mother; Depression in her maternal aunt, mother, and sister; Diabetes in her mother; Heart attack in an other family member; Heart failure in her brother, maternal grandfather, and sister; Lung cancer in her father; Stroke in her brother and mother.    ROS:  Please see the history of present illness.   Otherwise, review of systems are positive for none.   All other systems are reviewed and negative.    PHYSICAL EXAM: VS:  BP (!) 156/72 (BP Location: Left Arm, Patient Position: Sitting, Cuff Size: Normal)   Pulse 91   Ht 5\' 2"  (1.575 m)   Wt 110 lb 3.2 oz (50 kg)   LMP  (LMP Unknown)   BMI 20.16 kg/m  , BMI Body mass index is 20.16 kg/m. GENERAL:  Frail appearing HEENT:  Pupils equal round and reactive, fundi not visualized, oral mucosa unremarkable NECK:  No jugular venous distention, waveform within normal limits, carotid upstroke brisk and symmetric, no bruits, no thyromegaly LYMPHATICS:  No cervical, inguinal adenopathy LUNGS:  Clear to auscultation bilaterally BACK:  No CVA tenderness CHEST:   Unremarkable HEART:  PMI not displaced or sustained,S1 and S2 within normal limits, no S3, no S4, no clicks, no rubs, no murmurs ABD:  Flat, positive bowel sounds normal in frequency in pitch, no bruits, no rebound, no guarding, no midline pulsatile mass, no hepatomegaly, no splenomegaly EXT:  2 plus pulses throughout, no edema, no cyanosis no clubbing SKIN:  No rashes no nodules NEURO:  Cranial  nerves II through XII grossly intact, motor grossly intact throughout [facility]:  Cognitively intact, oriented to person place and time    EKG:  EKG Interpretation Date/Time:  Thursday June 19 2023 10:56:54 EST Ventricular Rate:  91 PR Interval:  196 QRS Duration:  78 QT Interval:  358 QTC Calculation: 440 R Axis:   -54  Text Interpretation: Sinus rhythm with occasional Premature ventricular complexes and Premature atrial complexes Left axis deviation Minimal voltage criteria for LVH, may be normal variant ( R in aVL ) Poor anterior R wave progression 21-May-2023 19:43, No significant change since last tracing Confirmed by Rollene Rotunda ([id number]) on 06/19/2023 11:14:28 AM     Recent Labs: 02/13/2023: TSH 1.386 05/21/2023: ALT 16 05/22/2023: BUN 16; Creatinine, Ser 0.62; Hemoglobin 14.1; Magnesium 2.1; Platelets 271; Potassium 3.7; Sodium 139    Lipid Panel    Component Value Date/Time   CHOL 186 12/16/2018 1157   TRIG 184.0 (H) 12/16/2018 1157   HDL 42.90 12/16/2018 1157   CHOLHDL 4 12/16/2018 1157   VLDL 36.8 12/16/2018 1157   LDLCALC 106 (H) 12/16/2018 1157      Wt Readings from Last 3 Encounters:  06/19/23 110 lb 3.2 oz (50 kg)  05/22/23 108 lb 7.5 oz (49.2 kg)  02/13/23 115 lb 1.3 oz (52.2 kg)      Other studies Reviewed: Additional studies/ records that were reviewed today include: Labs. Review of the above records demonstrates:  Please see elsewhere in the note.     ASSESSMENT AND PLAN:   Palpitations: She is having some ectopy on her EKG as above.  I do not think  that she is having any symptomatic tachypalpitations but I will have her wear a 3-day monitor.  Further change in therapy will be based on this result.  Fatigue: She is up-to-date with blood work.  I do not see any acute reversible cause for this.  No further workup.  Elevated troponin: I will check an echocardiogram but she is not having any unstable symptoms and I will manage this conservatively pending the results of that test.  Hypertension: Her blood pressure slightly elevated.  However, she is very frail and elderly and I do not want to add to her medications and she will proceed more conservative therapies.  No change in medications.   Current medicines are reviewed at length with the patient today.  The patient does not have concerns regarding medicines.  The following changes have been made:  no change  Labs/ tests ordered today include:   Orders Placed This Encounter  Procedures   EKG 12-Lead     Disposition:   FU with me as needed   Signed, Rollene Rotunda, MD  06/19/2023 11:15 AM    [facility]

## 2023-06-19 ENCOUNTER — Encounter: Payer: Self-pay | Admitting: Cardiology

## 2023-06-19 ENCOUNTER — Ambulatory Visit: Payer: Medicare HMO | Attending: Cardiology | Admitting: Cardiology

## 2023-06-19 ENCOUNTER — Ambulatory Visit (INDEPENDENT_AMBULATORY_CARE_PROVIDER_SITE_OTHER): Payer: Medicare HMO

## 2023-06-19 VITALS — BP 156/72 | HR 91 | Ht 62.0 in | Wt 110.2 lb

## 2023-06-19 DIAGNOSIS — N390 Urinary tract infection, site not specified: Secondary | ICD-10-CM | POA: Diagnosis not present

## 2023-06-19 DIAGNOSIS — R7989 Other specified abnormal findings of blood chemistry: Secondary | ICD-10-CM | POA: Diagnosis not present

## 2023-06-19 DIAGNOSIS — F0394 Unspecified dementia, unspecified severity, with anxiety: Secondary | ICD-10-CM | POA: Diagnosis not present

## 2023-06-19 DIAGNOSIS — F0393 Unspecified dementia, unspecified severity, with mood disturbance: Secondary | ICD-10-CM | POA: Diagnosis not present

## 2023-06-19 DIAGNOSIS — M879 Osteonecrosis, unspecified: Secondary | ICD-10-CM | POA: Diagnosis not present

## 2023-06-19 DIAGNOSIS — R002 Palpitations: Secondary | ICD-10-CM

## 2023-06-19 DIAGNOSIS — E1140 Type 2 diabetes mellitus with diabetic neuropathy, unspecified: Secondary | ICD-10-CM | POA: Diagnosis not present

## 2023-06-19 DIAGNOSIS — E1122 Type 2 diabetes mellitus with diabetic chronic kidney disease: Secondary | ICD-10-CM | POA: Diagnosis not present

## 2023-06-19 DIAGNOSIS — B9620 Unspecified Escherichia coli [E. coli] as the cause of diseases classified elsewhere: Secondary | ICD-10-CM | POA: Diagnosis not present

## 2023-06-19 DIAGNOSIS — I129 Hypertensive chronic kidney disease with stage 1 through stage 4 chronic kidney disease, or unspecified chronic kidney disease: Secondary | ICD-10-CM | POA: Diagnosis not present

## 2023-06-19 DIAGNOSIS — R0609 Other forms of dyspnea: Secondary | ICD-10-CM | POA: Diagnosis not present

## 2023-06-19 DIAGNOSIS — R0602 Shortness of breath: Secondary | ICD-10-CM

## 2023-06-19 DIAGNOSIS — E441 Mild protein-calorie malnutrition: Secondary | ICD-10-CM | POA: Diagnosis not present

## 2023-06-19 NOTE — Progress Notes (Unsigned)
Enrolled patient for a 3 day Zio XT monitor to be mailed to patients home  

## 2023-06-19 NOTE — Patient Instructions (Signed)
Medication Instructions:  No changes. *If you need a refill on your cardiac medications before your next appointment, please call your pharmacy*    Testing/Procedures: Your physician has requested that you have an echocardiogram. Echocardiography is a painless test that uses sound waves to create images of your heart. It provides your doctor with information about the size and shape of your heart and how well your heart's chambers and valves are working. This procedure takes approximately one hour. There are no restrictions for this procedure. Please do NOT wear cologne, perfume, aftershave, or lotions (deodorant is allowed).  [address] Please arrive 15 minutes prior to your appointment time.  Please note: We ask at that you not bring children with you during ultrasound (echo/ vascular) testing. Due to room size and safety concerns, children are not allowed in the ultrasound rooms during exams. Our front office staff cannot provide observation of children in our lobby area while testing is being conducted. An adult accompanying a patient to their appointment will only be allowed in the ultrasound room at the discretion of the ultrasound technician under special circumstances. We apologize for any inconvenience.   ZIO XT- Long Term Monitor Instructions  Your physician has requested you wear a ZIO patch monitor for 3 days.  This is a single patch monitor. [employer] supplies one patch monitor per enrollment. Additional stickers are not available. Please do not apply patch if you will be having a Nuclear Stress Test,  Echocardiogram, Cardiac CT, MRI, or Chest Xray during the period you would be wearing the  monitor. The patch cannot be worn during these tests. You cannot remove and re-apply the  ZIO XT patch monitor.  Your ZIO patch monitor will be mailed 3 day USPS to your address on file. It may take 3-5 days  to receive your monitor after you have been enrolled.  Once you have  received your monitor, please review the enclosed instructions. Your monitor  has already been registered assigning a specific monitor serial # to you.  Billing and Patient Assistance Program Information  We have supplied [employer] with any of your insurance information on file for billing purposes. [employer] offers a sliding scale Patient Assistance Program for patients that do not have  insurance, or whose insurance does not completely cover the cost of the ZIO monitor.  You must apply for the Patient Assistance Program to qualify for this discounted rate.  To apply, please call [employer] at [phone], select option 4, select option 2, ask to apply for  Patient Assistance Program. Meredeth Ide will ask your household income, and how many people  are in your household. They will quote your out-of-pocket cost based on that information.  [employer] will also be able to set up a 72-month, interest-free payment plan if needed.  Applying the monitor   Shave hair from upper left chest.  Hold abrader disc by orange tab. Rub abrader in 40 strokes over the upper left chest as  indicated in your monitor instructions.  Clean area with 4 enclosed alcohol pads. Let dry.  Apply patch as indicated in monitor instructions. Patch will be placed under collarbone on left  side of chest with arrow pointing upward.  Rub patch adhesive wings for 2 minutes. Remove white label marked "1". Remove the white  label marked "2". Rub patch adhesive wings for 2 additional minutes.  While looking in a mirror, press and release button in center of patch. A small green light will  flash 3-4  times. This will be your only indicator that the monitor has been turned on.  Do not shower for the first 24 hours. You may shower after the first 24 hours.  Press the button if you feel a symptom. You will hear a small click. Record Date, Time and  Symptom in the Patient Logbook.  When you are ready to remove the patch, follow instructions on  the last 2 pages of Patient  Logbook. Stick patch monitor onto the last page of Patient Logbook.  Place Patient Logbook in the blue and white box. Use locking tab on box and tape box closed  securely. The blue and white box has prepaid postage on it. Please place it in the mailbox as  soon as possible. Your physician should have your test results approximately 7 days after the  monitor has been mailed back to [facility].  Call [facility] Customer Care at [phone] if you have questions regarding  your ZIO XT patch monitor. Call them immediately if you see an orange light blinking on your  monitor.  If your monitor falls off in less than 4 days, contact our Monitor department at [phone].  If your monitor becomes loose or falls off after 4 days call [employer] at [phone] for  suggestions on securing your monitor   Follow-Up: At [facility], you and your health needs are our priority.  As part of our continuing mission to provide you with exceptional heart care, we have created designated Provider Care Teams.  These Care Teams include your primary Cardiologist (physician) and Advanced Practice Providers (APPs -  Physician Assistants and Nurse Practitioners) who all work together to provide you with the care you need, when you need it.  We recommend signing up for the patient portal called "MyChart".  Sign up information is provided on this After Visit Summary.  MyChart is used to connect with patients for Virtual Visits (Telemedicine).  Patients are able to view lab/test results, encounter notes, upcoming appointments, etc.  Non-urgent messages can be sent to your provider as well.   To learn more about what you can do with MyChart, go to [email or web address].    Your next appointment:    As needed.   Provider:   Rollene Rotunda, MD

## 2023-06-23 DIAGNOSIS — M879 Osteonecrosis, unspecified: Secondary | ICD-10-CM | POA: Diagnosis not present

## 2023-06-23 DIAGNOSIS — E1140 Type 2 diabetes mellitus with diabetic neuropathy, unspecified: Secondary | ICD-10-CM | POA: Diagnosis not present

## 2023-06-23 DIAGNOSIS — F0393 Unspecified dementia, unspecified severity, with mood disturbance: Secondary | ICD-10-CM | POA: Diagnosis not present

## 2023-06-23 DIAGNOSIS — I129 Hypertensive chronic kidney disease with stage 1 through stage 4 chronic kidney disease, or unspecified chronic kidney disease: Secondary | ICD-10-CM | POA: Diagnosis not present

## 2023-06-23 DIAGNOSIS — N390 Urinary tract infection, site not specified: Secondary | ICD-10-CM | POA: Diagnosis not present

## 2023-06-23 DIAGNOSIS — F0394 Unspecified dementia, unspecified severity, with anxiety: Secondary | ICD-10-CM | POA: Diagnosis not present

## 2023-06-23 DIAGNOSIS — E1122 Type 2 diabetes mellitus with diabetic chronic kidney disease: Secondary | ICD-10-CM | POA: Diagnosis not present

## 2023-06-23 DIAGNOSIS — B9620 Unspecified Escherichia coli [E. coli] as the cause of diseases classified elsewhere: Secondary | ICD-10-CM | POA: Diagnosis not present

## 2023-06-23 DIAGNOSIS — E441 Mild protein-calorie malnutrition: Secondary | ICD-10-CM | POA: Diagnosis not present

## 2023-06-24 DIAGNOSIS — E441 Mild protein-calorie malnutrition: Secondary | ICD-10-CM | POA: Diagnosis not present

## 2023-06-24 DIAGNOSIS — B9620 Unspecified Escherichia coli [E. coli] as the cause of diseases classified elsewhere: Secondary | ICD-10-CM | POA: Diagnosis not present

## 2023-06-24 DIAGNOSIS — M879 Osteonecrosis, unspecified: Secondary | ICD-10-CM | POA: Diagnosis not present

## 2023-06-24 DIAGNOSIS — I129 Hypertensive chronic kidney disease with stage 1 through stage 4 chronic kidney disease, or unspecified chronic kidney disease: Secondary | ICD-10-CM | POA: Diagnosis not present

## 2023-06-24 DIAGNOSIS — E1122 Type 2 diabetes mellitus with diabetic chronic kidney disease: Secondary | ICD-10-CM | POA: Diagnosis not present

## 2023-06-24 DIAGNOSIS — N390 Urinary tract infection, site not specified: Secondary | ICD-10-CM | POA: Diagnosis not present

## 2023-06-24 DIAGNOSIS — F0394 Unspecified dementia, unspecified severity, with anxiety: Secondary | ICD-10-CM | POA: Diagnosis not present

## 2023-06-24 DIAGNOSIS — E1140 Type 2 diabetes mellitus with diabetic neuropathy, unspecified: Secondary | ICD-10-CM | POA: Diagnosis not present

## 2023-06-24 DIAGNOSIS — F0393 Unspecified dementia, unspecified severity, with mood disturbance: Secondary | ICD-10-CM | POA: Diagnosis not present

## 2023-06-25 ENCOUNTER — Other Ambulatory Visit (HOSPITAL_COMMUNITY): Payer: Medicare HMO

## 2023-06-25 DIAGNOSIS — M879 Osteonecrosis, unspecified: Secondary | ICD-10-CM | POA: Diagnosis not present

## 2023-06-25 DIAGNOSIS — N390 Urinary tract infection, site not specified: Secondary | ICD-10-CM | POA: Diagnosis not present

## 2023-06-25 DIAGNOSIS — F0393 Unspecified dementia, unspecified severity, with mood disturbance: Secondary | ICD-10-CM | POA: Diagnosis not present

## 2023-06-25 DIAGNOSIS — E1140 Type 2 diabetes mellitus with diabetic neuropathy, unspecified: Secondary | ICD-10-CM | POA: Diagnosis not present

## 2023-06-25 DIAGNOSIS — E1122 Type 2 diabetes mellitus with diabetic chronic kidney disease: Secondary | ICD-10-CM | POA: Diagnosis not present

## 2023-06-25 DIAGNOSIS — B9620 Unspecified Escherichia coli [E. coli] as the cause of diseases classified elsewhere: Secondary | ICD-10-CM | POA: Diagnosis not present

## 2023-06-25 DIAGNOSIS — I129 Hypertensive chronic kidney disease with stage 1 through stage 4 chronic kidney disease, or unspecified chronic kidney disease: Secondary | ICD-10-CM | POA: Diagnosis not present

## 2023-06-25 DIAGNOSIS — F0394 Unspecified dementia, unspecified severity, with anxiety: Secondary | ICD-10-CM | POA: Diagnosis not present

## 2023-06-25 DIAGNOSIS — E441 Mild protein-calorie malnutrition: Secondary | ICD-10-CM | POA: Diagnosis not present

## 2023-07-01 DIAGNOSIS — E1122 Type 2 diabetes mellitus with diabetic chronic kidney disease: Secondary | ICD-10-CM | POA: Diagnosis not present

## 2023-07-01 DIAGNOSIS — B9620 Unspecified Escherichia coli [E. coli] as the cause of diseases classified elsewhere: Secondary | ICD-10-CM | POA: Diagnosis not present

## 2023-07-01 DIAGNOSIS — N390 Urinary tract infection, site not specified: Secondary | ICD-10-CM | POA: Diagnosis not present

## 2023-07-01 DIAGNOSIS — E1140 Type 2 diabetes mellitus with diabetic neuropathy, unspecified: Secondary | ICD-10-CM | POA: Diagnosis not present

## 2023-07-01 DIAGNOSIS — I129 Hypertensive chronic kidney disease with stage 1 through stage 4 chronic kidney disease, or unspecified chronic kidney disease: Secondary | ICD-10-CM | POA: Diagnosis not present

## 2023-07-01 DIAGNOSIS — M879 Osteonecrosis, unspecified: Secondary | ICD-10-CM | POA: Diagnosis not present

## 2023-07-01 DIAGNOSIS — F0393 Unspecified dementia, unspecified severity, with mood disturbance: Secondary | ICD-10-CM | POA: Diagnosis not present

## 2023-07-01 DIAGNOSIS — E441 Mild protein-calorie malnutrition: Secondary | ICD-10-CM | POA: Diagnosis not present

## 2023-07-01 DIAGNOSIS — F0394 Unspecified dementia, unspecified severity, with anxiety: Secondary | ICD-10-CM | POA: Diagnosis not present

## 2023-07-03 DIAGNOSIS — B9620 Unspecified Escherichia coli [E. coli] as the cause of diseases classified elsewhere: Secondary | ICD-10-CM | POA: Diagnosis not present

## 2023-07-03 DIAGNOSIS — E1140 Type 2 diabetes mellitus with diabetic neuropathy, unspecified: Secondary | ICD-10-CM | POA: Diagnosis not present

## 2023-07-03 DIAGNOSIS — N390 Urinary tract infection, site not specified: Secondary | ICD-10-CM | POA: Diagnosis not present

## 2023-07-03 DIAGNOSIS — M879 Osteonecrosis, unspecified: Secondary | ICD-10-CM | POA: Diagnosis not present

## 2023-07-03 DIAGNOSIS — I129 Hypertensive chronic kidney disease with stage 1 through stage 4 chronic kidney disease, or unspecified chronic kidney disease: Secondary | ICD-10-CM | POA: Diagnosis not present

## 2023-07-03 DIAGNOSIS — F0393 Unspecified dementia, unspecified severity, with mood disturbance: Secondary | ICD-10-CM | POA: Diagnosis not present

## 2023-07-03 DIAGNOSIS — E1122 Type 2 diabetes mellitus with diabetic chronic kidney disease: Secondary | ICD-10-CM | POA: Diagnosis not present

## 2023-07-03 DIAGNOSIS — F0394 Unspecified dementia, unspecified severity, with anxiety: Secondary | ICD-10-CM | POA: Diagnosis not present

## 2023-07-03 DIAGNOSIS — E441 Mild protein-calorie malnutrition: Secondary | ICD-10-CM | POA: Diagnosis not present

## 2023-07-08 DIAGNOSIS — I129 Hypertensive chronic kidney disease with stage 1 through stage 4 chronic kidney disease, or unspecified chronic kidney disease: Secondary | ICD-10-CM | POA: Diagnosis not present

## 2023-07-08 DIAGNOSIS — N390 Urinary tract infection, site not specified: Secondary | ICD-10-CM | POA: Diagnosis not present

## 2023-07-08 DIAGNOSIS — F0393 Unspecified dementia, unspecified severity, with mood disturbance: Secondary | ICD-10-CM | POA: Diagnosis not present

## 2023-07-08 DIAGNOSIS — B9620 Unspecified Escherichia coli [E. coli] as the cause of diseases classified elsewhere: Secondary | ICD-10-CM | POA: Diagnosis not present

## 2023-07-08 DIAGNOSIS — F0394 Unspecified dementia, unspecified severity, with anxiety: Secondary | ICD-10-CM | POA: Diagnosis not present

## 2023-07-08 DIAGNOSIS — E1122 Type 2 diabetes mellitus with diabetic chronic kidney disease: Secondary | ICD-10-CM | POA: Diagnosis not present

## 2023-07-08 DIAGNOSIS — M879 Osteonecrosis, unspecified: Secondary | ICD-10-CM | POA: Diagnosis not present

## 2023-07-08 DIAGNOSIS — E441 Mild protein-calorie malnutrition: Secondary | ICD-10-CM | POA: Diagnosis not present

## 2023-07-08 DIAGNOSIS — E1140 Type 2 diabetes mellitus with diabetic neuropathy, unspecified: Secondary | ICD-10-CM | POA: Diagnosis not present

## 2023-07-09 DIAGNOSIS — F0393 Unspecified dementia, unspecified severity, with mood disturbance: Secondary | ICD-10-CM | POA: Diagnosis not present

## 2023-07-09 DIAGNOSIS — B9620 Unspecified Escherichia coli [E. coli] as the cause of diseases classified elsewhere: Secondary | ICD-10-CM | POA: Diagnosis not present

## 2023-07-09 DIAGNOSIS — E1140 Type 2 diabetes mellitus with diabetic neuropathy, unspecified: Secondary | ICD-10-CM | POA: Diagnosis not present

## 2023-07-09 DIAGNOSIS — N390 Urinary tract infection, site not specified: Secondary | ICD-10-CM | POA: Diagnosis not present

## 2023-07-09 DIAGNOSIS — E1122 Type 2 diabetes mellitus with diabetic chronic kidney disease: Secondary | ICD-10-CM | POA: Diagnosis not present

## 2023-07-09 DIAGNOSIS — I129 Hypertensive chronic kidney disease with stage 1 through stage 4 chronic kidney disease, or unspecified chronic kidney disease: Secondary | ICD-10-CM | POA: Diagnosis not present

## 2023-07-09 DIAGNOSIS — F0394 Unspecified dementia, unspecified severity, with anxiety: Secondary | ICD-10-CM | POA: Diagnosis not present

## 2023-07-09 DIAGNOSIS — M879 Osteonecrosis, unspecified: Secondary | ICD-10-CM | POA: Diagnosis not present

## 2023-07-09 DIAGNOSIS — E441 Mild protein-calorie malnutrition: Secondary | ICD-10-CM | POA: Diagnosis not present

## 2023-07-10 DIAGNOSIS — E1122 Type 2 diabetes mellitus with diabetic chronic kidney disease: Secondary | ICD-10-CM | POA: Diagnosis not present

## 2023-07-10 DIAGNOSIS — F0394 Unspecified dementia, unspecified severity, with anxiety: Secondary | ICD-10-CM | POA: Diagnosis not present

## 2023-07-10 DIAGNOSIS — B9620 Unspecified Escherichia coli [E. coli] as the cause of diseases classified elsewhere: Secondary | ICD-10-CM | POA: Diagnosis not present

## 2023-07-10 DIAGNOSIS — M879 Osteonecrosis, unspecified: Secondary | ICD-10-CM | POA: Diagnosis not present

## 2023-07-10 DIAGNOSIS — E441 Mild protein-calorie malnutrition: Secondary | ICD-10-CM | POA: Diagnosis not present

## 2023-07-10 DIAGNOSIS — F0393 Unspecified dementia, unspecified severity, with mood disturbance: Secondary | ICD-10-CM | POA: Diagnosis not present

## 2023-07-10 DIAGNOSIS — N390 Urinary tract infection, site not specified: Secondary | ICD-10-CM | POA: Diagnosis not present

## 2023-07-10 DIAGNOSIS — I129 Hypertensive chronic kidney disease with stage 1 through stage 4 chronic kidney disease, or unspecified chronic kidney disease: Secondary | ICD-10-CM | POA: Diagnosis not present

## 2023-07-10 DIAGNOSIS — E1140 Type 2 diabetes mellitus with diabetic neuropathy, unspecified: Secondary | ICD-10-CM | POA: Diagnosis not present

## 2023-07-11 DIAGNOSIS — E441 Mild protein-calorie malnutrition: Secondary | ICD-10-CM | POA: Diagnosis not present

## 2023-07-11 DIAGNOSIS — B9620 Unspecified Escherichia coli [E. coli] as the cause of diseases classified elsewhere: Secondary | ICD-10-CM | POA: Diagnosis not present

## 2023-07-11 DIAGNOSIS — F0393 Unspecified dementia, unspecified severity, with mood disturbance: Secondary | ICD-10-CM | POA: Diagnosis not present

## 2023-07-11 DIAGNOSIS — N390 Urinary tract infection, site not specified: Secondary | ICD-10-CM | POA: Diagnosis not present

## 2023-07-11 DIAGNOSIS — F0394 Unspecified dementia, unspecified severity, with anxiety: Secondary | ICD-10-CM | POA: Diagnosis not present

## 2023-07-11 DIAGNOSIS — E1140 Type 2 diabetes mellitus with diabetic neuropathy, unspecified: Secondary | ICD-10-CM | POA: Diagnosis not present

## 2023-07-11 DIAGNOSIS — E1122 Type 2 diabetes mellitus with diabetic chronic kidney disease: Secondary | ICD-10-CM | POA: Diagnosis not present

## 2023-07-11 DIAGNOSIS — I129 Hypertensive chronic kidney disease with stage 1 through stage 4 chronic kidney disease, or unspecified chronic kidney disease: Secondary | ICD-10-CM | POA: Diagnosis not present

## 2023-07-11 DIAGNOSIS — M879 Osteonecrosis, unspecified: Secondary | ICD-10-CM | POA: Diagnosis not present

## 2023-07-12 DIAGNOSIS — E1122 Type 2 diabetes mellitus with diabetic chronic kidney disease: Secondary | ICD-10-CM | POA: Diagnosis not present

## 2023-07-12 DIAGNOSIS — N390 Urinary tract infection, site not specified: Secondary | ICD-10-CM | POA: Diagnosis not present

## 2023-07-12 DIAGNOSIS — E441 Mild protein-calorie malnutrition: Secondary | ICD-10-CM | POA: Diagnosis not present

## 2023-07-12 DIAGNOSIS — E1140 Type 2 diabetes mellitus with diabetic neuropathy, unspecified: Secondary | ICD-10-CM | POA: Diagnosis not present

## 2023-07-12 DIAGNOSIS — F0394 Unspecified dementia, unspecified severity, with anxiety: Secondary | ICD-10-CM | POA: Diagnosis not present

## 2023-07-12 DIAGNOSIS — I129 Hypertensive chronic kidney disease with stage 1 through stage 4 chronic kidney disease, or unspecified chronic kidney disease: Secondary | ICD-10-CM | POA: Diagnosis not present

## 2023-07-12 DIAGNOSIS — F0393 Unspecified dementia, unspecified severity, with mood disturbance: Secondary | ICD-10-CM | POA: Diagnosis not present

## 2023-07-12 DIAGNOSIS — M879 Osteonecrosis, unspecified: Secondary | ICD-10-CM | POA: Diagnosis not present

## 2023-07-12 DIAGNOSIS — B9620 Unspecified Escherichia coli [E. coli] as the cause of diseases classified elsewhere: Secondary | ICD-10-CM | POA: Diagnosis not present

## 2023-07-14 ENCOUNTER — Ambulatory Visit (HOSPITAL_COMMUNITY)
Admission: RE | Admit: 2023-07-14 | Payer: Medicare HMO | Source: Ambulatory Visit | Attending: Cardiology | Admitting: Cardiology

## 2023-07-14 ENCOUNTER — Encounter (HOSPITAL_COMMUNITY): Payer: Self-pay | Admitting: Cardiology

## 2023-07-14 DIAGNOSIS — E441 Mild protein-calorie malnutrition: Secondary | ICD-10-CM | POA: Diagnosis not present

## 2023-07-14 DIAGNOSIS — E1122 Type 2 diabetes mellitus with diabetic chronic kidney disease: Secondary | ICD-10-CM | POA: Diagnosis not present

## 2023-07-14 DIAGNOSIS — F0393 Unspecified dementia, unspecified severity, with mood disturbance: Secondary | ICD-10-CM | POA: Diagnosis not present

## 2023-07-14 DIAGNOSIS — E1140 Type 2 diabetes mellitus with diabetic neuropathy, unspecified: Secondary | ICD-10-CM | POA: Diagnosis not present

## 2023-07-14 DIAGNOSIS — N390 Urinary tract infection, site not specified: Secondary | ICD-10-CM | POA: Diagnosis not present

## 2023-07-14 DIAGNOSIS — B9620 Unspecified Escherichia coli [E. coli] as the cause of diseases classified elsewhere: Secondary | ICD-10-CM | POA: Diagnosis not present

## 2023-07-14 DIAGNOSIS — M879 Osteonecrosis, unspecified: Secondary | ICD-10-CM | POA: Diagnosis not present

## 2023-07-14 DIAGNOSIS — I129 Hypertensive chronic kidney disease with stage 1 through stage 4 chronic kidney disease, or unspecified chronic kidney disease: Secondary | ICD-10-CM | POA: Diagnosis not present

## 2023-07-14 DIAGNOSIS — F0394 Unspecified dementia, unspecified severity, with anxiety: Secondary | ICD-10-CM | POA: Diagnosis not present

## 2023-07-15 DIAGNOSIS — M879 Osteonecrosis, unspecified: Secondary | ICD-10-CM | POA: Diagnosis not present

## 2023-07-15 DIAGNOSIS — E1140 Type 2 diabetes mellitus with diabetic neuropathy, unspecified: Secondary | ICD-10-CM | POA: Diagnosis not present

## 2023-07-15 DIAGNOSIS — I129 Hypertensive chronic kidney disease with stage 1 through stage 4 chronic kidney disease, or unspecified chronic kidney disease: Secondary | ICD-10-CM | POA: Diagnosis not present

## 2023-07-15 DIAGNOSIS — F0393 Unspecified dementia, unspecified severity, with mood disturbance: Secondary | ICD-10-CM | POA: Diagnosis not present

## 2023-07-15 DIAGNOSIS — E1122 Type 2 diabetes mellitus with diabetic chronic kidney disease: Secondary | ICD-10-CM | POA: Diagnosis not present

## 2023-07-15 DIAGNOSIS — F0394 Unspecified dementia, unspecified severity, with anxiety: Secondary | ICD-10-CM | POA: Diagnosis not present

## 2023-07-15 DIAGNOSIS — E441 Mild protein-calorie malnutrition: Secondary | ICD-10-CM | POA: Diagnosis not present

## 2023-07-15 DIAGNOSIS — B9620 Unspecified Escherichia coli [E. coli] as the cause of diseases classified elsewhere: Secondary | ICD-10-CM | POA: Diagnosis not present

## 2023-07-15 DIAGNOSIS — N390 Urinary tract infection, site not specified: Secondary | ICD-10-CM | POA: Diagnosis not present

## 2023-07-16 DIAGNOSIS — B9620 Unspecified Escherichia coli [E. coli] as the cause of diseases classified elsewhere: Secondary | ICD-10-CM | POA: Diagnosis not present

## 2023-07-16 DIAGNOSIS — I129 Hypertensive chronic kidney disease with stage 1 through stage 4 chronic kidney disease, or unspecified chronic kidney disease: Secondary | ICD-10-CM | POA: Diagnosis not present

## 2023-07-16 DIAGNOSIS — M879 Osteonecrosis, unspecified: Secondary | ICD-10-CM | POA: Diagnosis not present

## 2023-07-16 DIAGNOSIS — N390 Urinary tract infection, site not specified: Secondary | ICD-10-CM | POA: Diagnosis not present

## 2023-07-16 DIAGNOSIS — E1122 Type 2 diabetes mellitus with diabetic chronic kidney disease: Secondary | ICD-10-CM | POA: Diagnosis not present

## 2023-07-16 DIAGNOSIS — E441 Mild protein-calorie malnutrition: Secondary | ICD-10-CM | POA: Diagnosis not present

## 2023-07-16 DIAGNOSIS — E1140 Type 2 diabetes mellitus with diabetic neuropathy, unspecified: Secondary | ICD-10-CM | POA: Diagnosis not present

## 2023-07-16 DIAGNOSIS — F0393 Unspecified dementia, unspecified severity, with mood disturbance: Secondary | ICD-10-CM | POA: Diagnosis not present

## 2023-07-16 DIAGNOSIS — F0394 Unspecified dementia, unspecified severity, with anxiety: Secondary | ICD-10-CM | POA: Diagnosis not present

## 2023-07-22 DIAGNOSIS — F0394 Unspecified dementia, unspecified severity, with anxiety: Secondary | ICD-10-CM | POA: Diagnosis not present

## 2023-07-22 DIAGNOSIS — E1140 Type 2 diabetes mellitus with diabetic neuropathy, unspecified: Secondary | ICD-10-CM | POA: Diagnosis not present

## 2023-07-22 DIAGNOSIS — M879 Osteonecrosis, unspecified: Secondary | ICD-10-CM | POA: Diagnosis not present

## 2023-07-22 DIAGNOSIS — E441 Mild protein-calorie malnutrition: Secondary | ICD-10-CM | POA: Diagnosis not present

## 2023-07-22 DIAGNOSIS — E1122 Type 2 diabetes mellitus with diabetic chronic kidney disease: Secondary | ICD-10-CM | POA: Diagnosis not present

## 2023-07-22 DIAGNOSIS — F0393 Unspecified dementia, unspecified severity, with mood disturbance: Secondary | ICD-10-CM | POA: Diagnosis not present

## 2023-07-22 DIAGNOSIS — N390 Urinary tract infection, site not specified: Secondary | ICD-10-CM | POA: Diagnosis not present

## 2023-07-22 DIAGNOSIS — I129 Hypertensive chronic kidney disease with stage 1 through stage 4 chronic kidney disease, or unspecified chronic kidney disease: Secondary | ICD-10-CM | POA: Diagnosis not present

## 2023-07-22 DIAGNOSIS — B9620 Unspecified Escherichia coli [E. coli] as the cause of diseases classified elsewhere: Secondary | ICD-10-CM | POA: Diagnosis not present

## 2023-07-24 DIAGNOSIS — E1122 Type 2 diabetes mellitus with diabetic chronic kidney disease: Secondary | ICD-10-CM | POA: Diagnosis not present

## 2023-07-24 DIAGNOSIS — N390 Urinary tract infection, site not specified: Secondary | ICD-10-CM | POA: Diagnosis not present

## 2023-07-24 DIAGNOSIS — E1140 Type 2 diabetes mellitus with diabetic neuropathy, unspecified: Secondary | ICD-10-CM | POA: Diagnosis not present

## 2023-07-24 DIAGNOSIS — B9620 Unspecified Escherichia coli [E. coli] as the cause of diseases classified elsewhere: Secondary | ICD-10-CM | POA: Diagnosis not present

## 2023-07-24 DIAGNOSIS — I129 Hypertensive chronic kidney disease with stage 1 through stage 4 chronic kidney disease, or unspecified chronic kidney disease: Secondary | ICD-10-CM | POA: Diagnosis not present

## 2023-07-24 DIAGNOSIS — F0394 Unspecified dementia, unspecified severity, with anxiety: Secondary | ICD-10-CM | POA: Diagnosis not present

## 2023-07-24 DIAGNOSIS — M879 Osteonecrosis, unspecified: Secondary | ICD-10-CM | POA: Diagnosis not present

## 2023-07-24 DIAGNOSIS — E441 Mild protein-calorie malnutrition: Secondary | ICD-10-CM | POA: Diagnosis not present

## 2023-07-24 DIAGNOSIS — F0393 Unspecified dementia, unspecified severity, with mood disturbance: Secondary | ICD-10-CM | POA: Diagnosis not present

## 2023-07-29 DIAGNOSIS — E1140 Type 2 diabetes mellitus with diabetic neuropathy, unspecified: Secondary | ICD-10-CM | POA: Diagnosis not present

## 2023-07-29 DIAGNOSIS — M879 Osteonecrosis, unspecified: Secondary | ICD-10-CM | POA: Diagnosis not present

## 2023-07-29 DIAGNOSIS — E1122 Type 2 diabetes mellitus with diabetic chronic kidney disease: Secondary | ICD-10-CM | POA: Diagnosis not present

## 2023-07-29 DIAGNOSIS — F0393 Unspecified dementia, unspecified severity, with mood disturbance: Secondary | ICD-10-CM | POA: Diagnosis not present

## 2023-07-29 DIAGNOSIS — N390 Urinary tract infection, site not specified: Secondary | ICD-10-CM | POA: Diagnosis not present

## 2023-07-29 DIAGNOSIS — B9620 Unspecified Escherichia coli [E. coli] as the cause of diseases classified elsewhere: Secondary | ICD-10-CM | POA: Diagnosis not present

## 2023-07-29 DIAGNOSIS — I129 Hypertensive chronic kidney disease with stage 1 through stage 4 chronic kidney disease, or unspecified chronic kidney disease: Secondary | ICD-10-CM | POA: Diagnosis not present

## 2023-07-29 DIAGNOSIS — E441 Mild protein-calorie malnutrition: Secondary | ICD-10-CM | POA: Diagnosis not present

## 2023-07-29 DIAGNOSIS — F0394 Unspecified dementia, unspecified severity, with anxiety: Secondary | ICD-10-CM | POA: Diagnosis not present

## 2023-07-31 DIAGNOSIS — K5900 Constipation, unspecified: Secondary | ICD-10-CM | POA: Diagnosis not present

## 2023-07-31 DIAGNOSIS — F0390 Unspecified dementia without behavioral disturbance: Secondary | ICD-10-CM | POA: Diagnosis not present

## 2023-07-31 DIAGNOSIS — M810 Age-related osteoporosis without current pathological fracture: Secondary | ICD-10-CM | POA: Diagnosis not present

## 2023-07-31 DIAGNOSIS — E611 Iron deficiency: Secondary | ICD-10-CM | POA: Diagnosis not present

## 2023-07-31 DIAGNOSIS — E1122 Type 2 diabetes mellitus with diabetic chronic kidney disease: Secondary | ICD-10-CM | POA: Diagnosis not present

## 2023-07-31 DIAGNOSIS — D6859 Other primary thrombophilia: Secondary | ICD-10-CM | POA: Diagnosis not present

## 2023-07-31 DIAGNOSIS — N1831 Chronic kidney disease, stage 3a: Secondary | ICD-10-CM | POA: Diagnosis not present

## 2023-07-31 DIAGNOSIS — R2681 Unsteadiness on feet: Secondary | ICD-10-CM | POA: Diagnosis not present

## 2023-07-31 DIAGNOSIS — M545 Low back pain, unspecified: Secondary | ICD-10-CM | POA: Diagnosis not present

## 2023-07-31 DIAGNOSIS — I10 Essential (primary) hypertension: Secondary | ICD-10-CM | POA: Diagnosis not present

## 2023-07-31 DIAGNOSIS — E441 Mild protein-calorie malnutrition: Secondary | ICD-10-CM | POA: Diagnosis not present

## 2023-07-31 DIAGNOSIS — F419 Anxiety disorder, unspecified: Secondary | ICD-10-CM | POA: Diagnosis not present

## 2023-08-06 ENCOUNTER — Emergency Department (HOSPITAL_COMMUNITY): Payer: PPO

## 2023-08-06 ENCOUNTER — Observation Stay (HOSPITAL_COMMUNITY)
Admission: EM | Admit: 2023-08-06 | Discharge: 2023-08-08 | Disposition: A | Discharge status: Home Health | Payer: PPO | Attending: Internal Medicine | Admitting: Internal Medicine

## 2023-08-06 ENCOUNTER — Other Ambulatory Visit: Payer: Self-pay

## 2023-08-06 ENCOUNTER — Encounter (HOSPITAL_COMMUNITY): Payer: Self-pay

## 2023-08-06 DIAGNOSIS — I10 Essential (primary) hypertension: Secondary | ICD-10-CM | POA: Diagnosis not present

## 2023-08-06 DIAGNOSIS — Z96641 Presence of right artificial hip joint: Secondary | ICD-10-CM | POA: Diagnosis not present

## 2023-08-06 DIAGNOSIS — R531 Weakness: Secondary | ICD-10-CM | POA: Diagnosis not present

## 2023-08-06 DIAGNOSIS — Z9889 Other specified postprocedural states: Secondary | ICD-10-CM | POA: Diagnosis not present

## 2023-08-06 DIAGNOSIS — I498 Other specified cardiac arrhythmias: Secondary | ICD-10-CM | POA: Insufficient documentation

## 2023-08-06 DIAGNOSIS — R55 Syncope and collapse: Secondary | ICD-10-CM

## 2023-08-06 DIAGNOSIS — Z79899 Other long term (current) drug therapy: Secondary | ICD-10-CM | POA: Diagnosis not present

## 2023-08-06 DIAGNOSIS — E1165 Type 2 diabetes mellitus with hyperglycemia: Secondary | ICD-10-CM | POA: Insufficient documentation

## 2023-08-06 DIAGNOSIS — J4489 Other specified chronic obstructive pulmonary disease: Secondary | ICD-10-CM | POA: Diagnosis not present

## 2023-08-06 DIAGNOSIS — N3000 Acute cystitis without hematuria: Secondary | ICD-10-CM | POA: Diagnosis not present

## 2023-08-06 DIAGNOSIS — R42 Dizziness and giddiness: Secondary | ICD-10-CM | POA: Diagnosis not present

## 2023-08-06 DIAGNOSIS — R000 Tachycardia, unspecified: Secondary | ICD-10-CM | POA: Diagnosis not present

## 2023-08-06 DIAGNOSIS — Z905 Acquired absence of kidney: Secondary | ICD-10-CM | POA: Diagnosis not present

## 2023-08-06 DIAGNOSIS — I6782 Cerebral ischemia: Secondary | ICD-10-CM | POA: Diagnosis not present

## 2023-08-06 DIAGNOSIS — Z8550 Personal history of malignant neoplasm of unspecified urinary tract organ: Secondary | ICD-10-CM | POA: Diagnosis not present

## 2023-08-06 DIAGNOSIS — E872 Acidosis, unspecified: Secondary | ICD-10-CM | POA: Diagnosis present

## 2023-08-06 LAB — BASIC METABOLIC PANEL
Anion gap: 10 (ref 5–15)
BUN: 18 mg/dL (ref 8–23)
CO2: 20 mmol/L — ABNORMAL LOW (ref 22–32)
Calcium: 9 mg/dL (ref 8.9–10.3)
Chloride: 107 mmol/L (ref 98–111)
Creatinine, Ser: 0.57 mg/dL (ref 0.44–1.00)
GFR, Estimated: >60 mL/min (ref >60)
Glucose, Bld: 164 mg/dL — ABNORMAL HIGH (ref 70–99)
Potassium: 4.4 mmol/L (ref 3.5–5.1)
Sodium: 137 mmol/L (ref 135–145)

## 2023-08-06 LAB — PROTIME-INR
INR: 1.1 (ref 0.8–1.2)
Prothrombin Time: 14.1 s (ref 11.4–15.2)

## 2023-08-06 LAB — CBC
HCT: 45.8 % (ref 36.0–46.0)
Hemoglobin: 15.4 g/dL — ABNORMAL HIGH (ref 12.0–15.0)
MCH: 30.9 pg (ref 26.0–34.0)
MCHC: 33.6 g/dL (ref 30.0–36.0)
MCV: 91.8 fL (ref 80.0–100.0)
Platelets: 273 10*3/uL (ref 150–400)
RBC: 4.99 MIL/uL (ref 3.87–5.11)
RDW: 13.3 % (ref 11.5–15.5)
WBC: 6.8 10*3/uL (ref 4.0–10.5)
nRBC: 0 % (ref 0.0–0.2)

## 2023-08-06 LAB — TROPONIN I (HIGH SENSITIVITY)
Troponin I (High Sensitivity): 12 ng/L (ref <18)
Troponin I (High Sensitivity): 13 ng/L (ref <18)

## 2023-08-06 LAB — MAGNESIUM: Magnesium: 2.4 mg/dL (ref 1.7–2.4)

## 2023-08-06 LAB — APTT: aPTT: 31 s (ref 24–36)

## 2023-08-06 LAB — TSH: TSH: 1.306 u[IU]/mL (ref 0.350–4.500)

## 2023-08-06 MED ORDER — HEPARIN SODIUM (PORCINE) 5000 UNIT/ML IJ SOLN
5000.0000 [IU] | Freq: Three times a day (TID) | INTRAMUSCULAR | Status: DC
  Administered 2023-08-06 – 2023-08-08 (×5): 5000 [IU] via SUBCUTANEOUS
  Filled 2023-08-06 (×5): qty 1

## 2023-08-06 MED ORDER — ONDANSETRON HCL 4 MG PO TABS: 4.0000 mg | ORAL_TABLET | Freq: Four times a day (QID) | ORAL | Status: DC | PRN

## 2023-08-06 MED ORDER — ACETAMINOPHEN 650 MG RE SUPP: 650.0000 mg | Freq: Four times a day (QID) | RECTAL | Status: DC | PRN

## 2023-08-06 MED ORDER — BISACODYL 10 MG RE SUPP
10.0000 mg | Freq: Every day | RECTAL | Status: AC | PRN
  Administered 2023-08-07 (×2): 10 mg via RECTAL
  Filled 2023-08-06 (×2): qty 1

## 2023-08-06 MED ORDER — DOCUSATE SODIUM 100 MG PO CAPS
100.0000 mg | ORAL_CAPSULE | Freq: Every day | ORAL | Status: DC
  Administered 2023-08-06 – 2023-08-08 (×3): 100 mg via ORAL
  Filled 2023-08-06 (×3): qty 1

## 2023-08-06 MED ORDER — AMLODIPINE BESYLATE 5 MG PO TABS
5.0000 mg | ORAL_TABLET | Freq: Every day | ORAL | Status: DC
  Administered 2023-08-07: 5 mg via ORAL
  Filled 2023-08-06: qty 1

## 2023-08-06 MED ORDER — ACETAMINOPHEN 325 MG PO TABS
650.0000 mg | ORAL_TABLET | Freq: Four times a day (QID) | ORAL | Status: DC | PRN
  Administered 2023-08-06 – 2023-08-07 (×2): 650 mg via ORAL
  Filled 2023-08-06 (×2): qty 2

## 2023-08-06 MED ORDER — ONDANSETRON HCL 4 MG/2ML IJ SOLN: 4.0000 mg | Freq: Four times a day (QID) | INTRAMUSCULAR | Status: DC | PRN

## 2023-08-06 MED ORDER — ALBUTEROL SULFATE (2.5 MG/3ML) 0.083% IN NEBU: 2.5000 mg | INHALATION_SOLUTION | RESPIRATORY_TRACT | Status: DC | PRN

## 2023-08-06 NOTE — H&P (Signed)
 History and Physical  Alexa [name] FMW:[id number] DOB: 1930/04/27 DOA: 08/06/2023  PCP: Valentin Skates, DO   Chief Complaint: Dizziness  HPI: Alexa [name] is a 89 y.o. female with medical history significant for diabetes, hypertension, history of renal cell carcinoma status post nephrectomy being admitted to the hospital due to dizziness.  Patient is very sharp and independent, tells me she was in her usual state of health until this morning when she got a little bit dizzy and lightheaded.  States that she recently has a little bit of trouble sleeping at night due to having to get up to urinate frequently.  That happened last night, so she was not feeling so great this morning and was a little tired.  Her granddaughter came over to spend some time with her, and when she got up off the couch where she was resting, she got very dizzy, with the floor spinning and felt like she might pass out.  EMS was called due to this dizziness, at which time the patient member that she forgot to take her morning dose of amlodipine .  Fire department helped her take her medication, and then transported her to the emergency department.  They noted initial blood pressure of 180/110.  In the emergency department workup including labs and imaging was relatively unremarkable, the patient stated that she still felt a little bit unsteady on her feet when trying to ambulate with the ER provider.  As such, hospitalist was contacted to consider observation admission given the patient's advanced age, and the fact that she lives by herself.  Review of Systems: Please see HPI for pertinent positives and negatives. A complete 10 system review of systems are otherwise negative.  Past Medical History:  Diagnosis Date   Angio-edema    Asthma    Chronic obstructive pulmonary disease (HCC)    Diverticulosis of colon (without mention of hemorrhage)    Eczema    Esophageal reflux    History of colonic polyps 06/05/2005   hyperplastic    Internal hemorrhoids    Irritable bowel syndrome    Ischemic colitis (HCC)    Malignant neoplasm of kidney and other and unspecified urinary organs    renal carcinoma, left nephrectomy in 2008-per patient.   Other and unspecified hyperlipidemia    Personal history of venous thrombosis and embolism    PTE after nephrectomy   Spinal stenosis    Type II or unspecified type diabetes mellitus without mention of complication, not stated as uncontrolled 02/05/2012   pt states it was in 2007   Unspecified essential hypertension    Urticaria    Past Surgical History:  Procedure Laterality Date   ADENOIDECTOMY     BREAST BIOPSY     COLONOSCOPY W/ POLYPECTOMY  2000   COLONOSCOPY W/ POLYPECTOMY  07/2004   Hyperplastic polyps   DILATION AND CURETTAGE OF UTERUS  03/2005   Uterine Polyps   NEPHRECTOMY  2007   right,Dr Dahlstedt   SEPTOPLASTY     TONSILLECTOMY     TOTAL HIP ARTHROPLASTY Right 12/16/2021   Procedure: TOTAL HIP ARTHROPLASTY ANTERIOR APPROACH;  Surgeon: Fidel Rogue, MD;  Location: [facility];  Service: Orthopedics;  Laterality: Right;   Social History:  reports that she has never smoked. She has never used smokeless tobacco. She reports that she does not drink alcohol  and does not use drugs.  Allergies  Allergen Reactions   Lisinopril      Cough D/Ced by Dr Darlean   Codeine   nausea   Verapamil     Pain in feet    Family History  Problem Relation Age of Onset   Stroke Mother        onset in 52s   Diabetes Mother    Cancer Mother        bladder; nephrectomy for calculi/also uterine , TAH & BSO   Aneurysm Mother        thoracic   Depression Mother    Lung cancer Father        smoked   Heart failure Sister    Depression Sister    Alzheimer's disease Sister    COPD Sister    Bipolar disorder Sister    Stroke Brother    Heart failure Brother    Aneurysm Brother        cns   Heart failure Maternal Grandfather    Depression Maternal Aunt        X2   Heart  attack Other        maternal family history   Alcoholism Neg Hx      Prior to Admission medications   Medication Sig Start Date End Date Taking? Authorizing Provider  acetaminophen  (TYLENOL ) 500 MG tablet Take 500-1,000 mg by mouth every 6 (six) hours as needed for moderate pain (pain score 4-6).    [provider]  albuterol  (PROVENTIL ) (2.5 MG/3ML) 0.083% nebulizer solution Take 2.5 mg by nebulization every 4 (four) hours as needed for wheezing or shortness of breath. 04/11/23   [provider]  amLODipine  (NORVASC ) 5 MG tablet Take 2 tablets (10 mg total) by mouth daily. Patient taking differently: Take 5 mg by mouth daily. 02/17/23   Gherghe, Costin M, MD  Benzalkonium Chloride (DIABETIC BASICS HEALTHY FOOT EX) Apply 1 application  topically at bedtime.    [provider]  CRANBERRY PO Take 2 tablets by mouth 2 (two) times daily.    [provider]  Cyanocobalamin  (VITAMIN B 12 PO) Take 1 Dose by mouth daily.    [provider]  ESTRACE  VAGINAL 0.1 MG/GM vaginal cream Apply locally every other night at bedtime 01/16/16   [provider]  fluticasone  (FLONASE ) 50 MCG/ACT nasal spray Place 1 spray into both nostrils daily.    [provider]  hydrOXYzine  (ATARAX ) 10 MG tablet Take 10 mg by mouth daily. 11/13/21   [provider]  Misc Natural Products (RED WINE COMPLEX PO) Take 1 tablet by mouth daily.    [provider]  Multiple Vitamins-Minerals (ZINC PO) Take 1 tablet by mouth daily.    [provider]  sitaGLIPtin  (JANUVIA ) 50 MG tablet Take 50 mg by mouth daily as needed.    [provider]  VITAMIN D  PO Take 1 tablet by mouth daily.    [provider]  Vitamin D , Ergocalciferol , (DRISDOL) 1.25 MG (50000 UNIT) CAPS capsule Take 50,000 Units by mouth once a week. 11/29/21   [provider]  ferrous sulfate  325 (65 FE) MG tablet Take 1 tablet (325 mg total) by mouth daily with  breakfast. 12/20/21 10/09/22  Antoinette Doe, MD    Physical Exam: BP (!) 167/104   Pulse 65   Temp 98.5 F (36.9 C)   Resp 20   Ht 5' 2 (1.575 m)   Wt 50 kg   LMP  (LMP Unknown)   SpO2 100%   BMI 20.16 kg/m  General: Who appears younger than her stated age, alert and oriented.  Good historian. Cardiovascular: Irregular,  no murmurs or rubs, no peripheral edema  Respiratory: clear to auscultation bilaterally, no wheezes, no crackles  Abdomen: soft, nontender, nondistended, normal bowel tones heard  Skin: dry, no rashes  Musculoskeletal: no joint effusions, normal range of motion  Psychiatric: appropriate affect, normal speech  Neurologic: extraocular muscles intact, clear speech, moving all extremities with intact sensorium thin elderly         Labs on Admission:  Basic Metabolic Panel: Recent Labs  Lab 08/06/23 1329  NA 137  K 4.4  CL 107  CO2 20*  GLUCOSE 164*  BUN 18  CREATININE 0.57  CALCIUM 9.0  MG 2.4   Liver Function Tests: No results for input(s): AST, ALT, ALKPHOS, BILITOT, PROT, ALBUMIN  in the last 168 hours. No results for input(s): LIPASE, AMYLASE in the last 168 hours. No results for input(s): AMMONIA in the last 168 hours. CBC: Recent Labs  Lab 08/06/23 1329  WBC 6.8  HGB 15.4*  HCT 45.8  MCV 91.8  PLT 273   Cardiac Enzymes: No results for input(s): CKTOTAL, CKMB, CKMBINDEX, TROPONINI in the last 168 hours. BNP (last 3 results) No results for input(s): BNP in the last 8760 hours.  ProBNP (last 3 results) No results for input(s): PROBNP in the last 8760 hours.  CBG: No results for input(s): GLUCAP in the last 168 hours.  Radiological Exams on Admission: CT Head Wo Contrast Result Date: 08/06/2023 CLINICAL DATA:  Syncope/presyncope. Cerebrovascular cause suspected. Dizziness. EXAM: CT HEAD WITHOUT CONTRAST TECHNIQUE: Contiguous axial images were obtained from the base of the skull through the vertex without  intravenous contrast. RADIATION DOSE REDUCTION: This exam was performed according to the departmental dose-optimization program which includes automated exposure control, adjustment of the mA and/or kV according to patient size and/or use of iterative reconstruction technique. COMPARISON:  10/18/2022 FINDINGS: Brain: No focal brainstem finding. 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 Cerebral hemispheres show generalized atrophy with chronic small-vessel ischemic changes of the white matter. No large vessel territory stroke. No mass, hemorrhage, hydrocephalus or extra-axial collection. Vascular: No abnormal vascular finding. Skull: Negative Sinuses/Orbits: Clear/normal Other: None IMPRESSION: No acute CT finding. 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 Generalized atrophy and chronic small-vessel ischemic changes of the cerebral hemispheric white matter. Electronically Signed   By: Oneil Officer M.D.   On: 08/06/2023 15:44   Assessment/Plan Alexa [name] is a 89 y.o. female with medical history significant for diabetes, hypertension, history of renal cell carcinoma status post nephrectomy being admitted to the hospital due to dizziness.    Uncontrolled hypertension-in the setting of not sleeping well, and forgetting to take her amlodipine  this morning, she likely had some dizziness due to her uncontrolled hypertension.  Blood pressure is now improved.  Note head CT with no acute findings. -Observation admission -PT/OT -Continue home amlodipine  5 mg p.o. daily  Diabetes-she intermittently takes Januvia , will plan for regular diet note glucose only slightly elevated  Sinus arrhythmia-currently asymptomatic, would repeat EKG in case of recurrence.  For now we will monitor on telemetry.  Patient will be observed overnight, will likely plan on discharge home in the morning, with resumption of outpatient hospice services.  Note that on her most recent hospital admission, she declined SNF.  She tells me once  again today that it is her goal to stay at home as long as possible.  DVT prophylaxis: Subcutaneous heparin     Code Status: Limited: Do not attempt resuscitation (DNR) -DNR-LIMITED -Do Not Intubate/DNI   Consults called: None  Admission  status: Observation  Time spent: 49 minutes  Casy Brunetto CHRISTELLA Gail MD [facility] Pager [phone]  If 7PM-7AM, please contact night-coverage [email or web address] Password [facility]  08/06/2023, 4:15 PM

## 2023-08-06 NOTE — ED Triage Notes (Signed)
 BIBA from home for hypertension and dizziness. Pt forgot to take BP meds this morning, took them prior to being brought in. 180/110 initial BP, now 160 palp

## 2023-08-07 ENCOUNTER — Observation Stay (HOSPITAL_COMMUNITY): Payer: PPO

## 2023-08-07 DIAGNOSIS — E1165 Type 2 diabetes mellitus with hyperglycemia: Secondary | ICD-10-CM

## 2023-08-07 DIAGNOSIS — I10 Essential (primary) hypertension: Secondary | ICD-10-CM | POA: Diagnosis not present

## 2023-08-07 DIAGNOSIS — E872 Acidosis, unspecified: Secondary | ICD-10-CM | POA: Diagnosis not present

## 2023-08-07 DIAGNOSIS — N3000 Acute cystitis without hematuria: Secondary | ICD-10-CM | POA: Diagnosis not present

## 2023-08-07 DIAGNOSIS — J4489 Other specified chronic obstructive pulmonary disease: Secondary | ICD-10-CM | POA: Diagnosis not present

## 2023-08-07 DIAGNOSIS — R42 Dizziness and giddiness: Secondary | ICD-10-CM | POA: Diagnosis not present

## 2023-08-07 DIAGNOSIS — K429 Umbilical hernia without obstruction or gangrene: Secondary | ICD-10-CM | POA: Diagnosis not present

## 2023-08-07 DIAGNOSIS — Z905 Acquired absence of kidney: Secondary | ICD-10-CM

## 2023-08-07 DIAGNOSIS — Z79899 Other long term (current) drug therapy: Secondary | ICD-10-CM | POA: Diagnosis not present

## 2023-08-07 DIAGNOSIS — Z96641 Presence of right artificial hip joint: Secondary | ICD-10-CM | POA: Diagnosis not present

## 2023-08-07 DIAGNOSIS — K5730 Diverticulosis of large intestine without perforation or abscess without bleeding: Secondary | ICD-10-CM | POA: Diagnosis not present

## 2023-08-07 DIAGNOSIS — I498 Other specified cardiac arrhythmias: Secondary | ICD-10-CM

## 2023-08-07 DIAGNOSIS — Z9889 Other specified postprocedural states: Secondary | ICD-10-CM | POA: Diagnosis not present

## 2023-08-07 DIAGNOSIS — I77819 Aortic ectasia, unspecified site: Secondary | ICD-10-CM | POA: Diagnosis not present

## 2023-08-07 DIAGNOSIS — R0602 Shortness of breath: Secondary | ICD-10-CM | POA: Diagnosis not present

## 2023-08-07 DIAGNOSIS — Z8550 Personal history of malignant neoplasm of unspecified urinary tract organ: Secondary | ICD-10-CM | POA: Diagnosis not present

## 2023-08-07 DIAGNOSIS — R55 Syncope and collapse: Secondary | ICD-10-CM | POA: Diagnosis not present

## 2023-08-07 LAB — COMPREHENSIVE METABOLIC PANEL
ALT: 14 U/L (ref 0–44)
AST: 18 U/L (ref 15–41)
Albumin: 3.8 g/dL (ref 3.5–5.0)
Alkaline Phosphatase: 58 U/L (ref 38–126)
Anion gap: 11 (ref 5–15)
BUN: 22 mg/dL (ref 8–23)
CO2: 23 mmol/L (ref 22–32)
Calcium: 8.9 mg/dL (ref 8.9–10.3)
Chloride: 103 mmol/L (ref 98–111)
Creatinine, Ser: 0.78 mg/dL (ref 0.44–1.00)
GFR, Estimated: >60 mL/min (ref >60)
Glucose, Bld: 230 mg/dL — ABNORMAL HIGH (ref 70–99)
Potassium: 3.7 mmol/L (ref 3.5–5.1)
Sodium: 137 mmol/L (ref 135–145)
Total Bilirubin: 1 mg/dL (ref 0.0–1.2)
Total Protein: 7 g/dL (ref 6.5–8.1)

## 2023-08-07 LAB — CBC WITH DIFFERENTIAL/PLATELET
Abs Immature Granulocytes: 0.03 10*3/uL (ref 0.00–0.07)
Basophils Absolute: 0.1 10*3/uL (ref 0.0–0.1)
Basophils Relative: 1 %
Eosinophils Absolute: 0.3 10*3/uL (ref 0.0–0.5)
Eosinophils Relative: 4 %
HCT: 46.1 % — ABNORMAL HIGH (ref 36.0–46.0)
Hemoglobin: 15.3 g/dL — ABNORMAL HIGH (ref 12.0–15.0)
Immature Granulocytes: 0 %
Lymphocytes Relative: 30 %
Lymphs Abs: 2.2 10*3/uL (ref 0.7–4.0)
MCH: 30.1 pg (ref 26.0–34.0)
MCHC: 33.2 g/dL (ref 30.0–36.0)
MCV: 90.6 fL (ref 80.0–100.0)
Monocytes Absolute: 0.5 10*3/uL (ref 0.1–1.0)
Monocytes Relative: 7 %
Neutro Abs: 4.2 10*3/uL (ref 1.7–7.7)
Neutrophils Relative %: 58 %
Platelets: 307 10*3/uL (ref 150–400)
RBC: 5.09 MIL/uL (ref 3.87–5.11)
RDW: 13.3 % (ref 11.5–15.5)
WBC: 7.2 10*3/uL (ref 4.0–10.5)
nRBC: 0 % (ref 0.0–0.2)

## 2023-08-07 LAB — URINALYSIS, ROUTINE W REFLEX MICROSCOPIC
Bilirubin Urine: NEGATIVE
Glucose, UA: NEGATIVE mg/dL
Hgb urine dipstick: NEGATIVE
Ketones, ur: NEGATIVE mg/dL
Nitrite: NEGATIVE
Protein, ur: NEGATIVE mg/dL
Specific Gravity, Urine: 1.006 (ref 1.005–1.030)
pH: 7 (ref 5.0–8.0)

## 2023-08-07 LAB — D-DIMER, QUANTITATIVE: D-Dimer, Quant: 0.41 ug{FEU}/mL (ref 0.00–0.50)

## 2023-08-07 LAB — TROPONIN I (HIGH SENSITIVITY): Troponin I (High Sensitivity): 14 ng/L (ref <18)

## 2023-08-07 LAB — HEMOGLOBIN A1C
Hgb A1c MFr Bld: 6.1 % — ABNORMAL HIGH (ref 4.8–5.6)
Mean Plasma Glucose: 128.37 mg/dL

## 2023-08-07 LAB — GLUCOSE, CAPILLARY
Glucose-Capillary: 234 mg/dL — ABNORMAL HIGH (ref 70–99)
Glucose-Capillary: 168 mg/dL — ABNORMAL HIGH (ref 70–99)
Glucose-Capillary: 152 mg/dL — ABNORMAL HIGH (ref 70–99)

## 2023-08-07 LAB — MAGNESIUM: Magnesium: 2.3 mg/dL (ref 1.7–2.4)

## 2023-08-07 LAB — LACTIC ACID, PLASMA: Lactic Acid, Venous: 2.7 mmol/L (ref 0.5–1.9)

## 2023-08-07 MED ORDER — INSULIN ASPART 100 UNIT/ML IJ SOLN
0.0000 [IU] | Freq: Three times a day (TID) | INTRAMUSCULAR | Status: DC
  Administered 2023-08-07: 2 [IU] via SUBCUTANEOUS

## 2023-08-07 MED ORDER — MORPHINE SULFATE (PF) 2 MG/ML IV SOLN
2.0000 mg | INTRAVENOUS | Status: DC | PRN
  Administered 2023-08-07: 2 mg via INTRAVENOUS
  Filled 2023-08-07: qty 1

## 2023-08-07 MED ORDER — PROSIGHT PO TABS
1.0000 | ORAL_TABLET | Freq: Two times a day (BID) | ORAL | Status: DC
  Administered 2023-08-07 – 2023-08-08 (×2): 1 via ORAL
  Filled 2023-08-07 (×2): qty 1

## 2023-08-07 MED ORDER — ESTROGENS CONJUGATED 0.625 MG/GM VA CREA
1.0000 | TOPICAL_CREAM | Freq: Every day | VAGINAL | Status: DC
  Administered 2023-08-07: 1 via VAGINAL
  Filled 2023-08-07: qty 30

## 2023-08-07 MED ORDER — LABETALOL HCL 5 MG/ML IV SOLN
10.0000 mg | INTRAVENOUS | Status: DC | PRN
Start: 2023-08-07 — End: 2023-08-08

## 2023-08-07 MED ORDER — VITAMIN D 25 MCG (1000 UNIT) PO TABS
1000.0000 [IU] | ORAL_TABLET | Freq: Every day | ORAL | Status: DC
  Administered 2023-08-07 – 2023-08-08 (×2): 1000 [IU] via ORAL
  Filled 2023-08-07 (×2): qty 1

## 2023-08-07 MED ORDER — POLYVINYL ALCOHOL 1.4 % OP SOLN
1.0000 [drp] | Freq: Three times a day (TID) | OPHTHALMIC | Status: DC | PRN
  Filled 2023-08-07: qty 15

## 2023-08-07 MED ORDER — HYDROCODONE-ACETAMINOPHEN 5-325 MG PO TABS
1.0000 | ORAL_TABLET | ORAL | Status: DC
  Administered 2023-08-07 – 2023-08-08 (×2): 1 via ORAL
  Filled 2023-08-07 (×2): qty 1

## 2023-08-07 MED ORDER — IOHEXOL 300 MG/ML  SOLN
100.0000 mL | Freq: Once | INTRAMUSCULAR | Status: AC | PRN
  Administered 2023-08-07: 100 mL via INTRAVENOUS

## 2023-08-07 MED ORDER — MORPHINE SULFATE (PF) 2 MG/ML IV SOLN: 2.0000 mg | INTRAVENOUS | Status: DC | PRN

## 2023-08-07 MED ORDER — LACTATED RINGERS IV SOLN: INTRAVENOUS | Status: AC

## 2023-08-07 MED ORDER — HYDROXYZINE HCL 10 MG PO TABS
10.0000 mg | ORAL_TABLET | Freq: Every day | ORAL | Status: DC | PRN
  Administered 2023-08-07: 10 mg via ORAL
  Filled 2023-08-07 (×3): qty 1

## 2023-08-07 MED ORDER — SODIUM CHLORIDE 0.9 % IV SOLN
1.0000 g | INTRAVENOUS | Status: DC
  Administered 2023-08-07: 1 g via INTRAVENOUS
  Filled 2023-08-07: qty 10

## 2023-08-07 MED ORDER — AMLODIPINE BESYLATE 5 MG PO TABS
10.0000 mg | ORAL_TABLET | Freq: Every day | ORAL | Status: DC
  Administered 2023-08-08: 10 mg via ORAL
  Filled 2023-08-07: qty 2

## 2023-08-07 MED ORDER — HYDRALAZINE HCL 20 MG/ML IJ SOLN
10.0000 mg | Freq: Four times a day (QID) | INTRAMUSCULAR | Status: DC | PRN
  Administered 2023-08-07: 10 mg via INTRAVENOUS
  Filled 2023-08-07: qty 1

## 2023-08-07 MED ORDER — FLUTICASONE PROPIONATE 50 MCG/ACT NA SUSP
1.0000 | Freq: Every day | NASAL | Status: DC
  Administered 2023-08-07 – 2023-08-08 (×2): 1 via NASAL
  Filled 2023-08-07: qty 16

## 2023-08-07 MED ORDER — IOHEXOL 300 MG/ML  SOLN
30.0000 mL | Freq: Once | INTRAMUSCULAR | Status: AC | PRN
  Administered 2023-08-07: 30 mL via ORAL

## 2023-08-07 MED ORDER — HYDROCODONE-ACETAMINOPHEN 5-325 MG PO TABS
1.0000 | ORAL_TABLET | ORAL | Status: DC
  Administered 2023-08-07: 1 via ORAL
  Filled 2023-08-07: qty 1

## 2023-08-07 NOTE — Plan of Care (Signed)
   Problem: Clinical Measurements: Goal: Cardiovascular complication will be avoided Outcome: Progressing   Problem: Activity: Goal: Risk for activity intolerance will decrease Outcome: Progressing   Problem: Nutrition: Goal: Adequate nutrition will be maintained Outcome: Progressing   Problem: Coping: Goal: Level of anxiety will decrease Outcome: Progressing   Problem: Elimination: Goal: Will not experience complications related to bowel motility Outcome: Progressing

## 2023-08-07 NOTE — TOC Initial Note (Signed)
 Transition of Care [facility]) - Initial/Assessment Note    Patient Details  Name: Alexa [name] MRN: [id number] Date of Birth: October 26, 1929  [facility] CM/SW Contact:    Bascom Service, RN Phone Number: 08/07/2023, 9:24 AM  Clinical Narrative:  Active [facility] HHRN/PT/aide.                 Expected Discharge Plan: Home w Home Health Services Barriers to Discharge: Continued Medical Work up   Patient Goals and CMS Choice Patient states their goals for this hospitalization and ongoing recovery are:: Home CMS Medicare.gov Compare Post Acute Care list provided to:: Patient Choice offered to / list presented to : Patient [place] ownership interest in [facility].provided to:: Patient    Expected Discharge Plan and Services   Discharge Planning Services: CM Consult   Living arrangements for the past 2 months: Single Family Home                                      Prior Living Arrangements/Services Living arrangements for the past 2 months: Single Family Home Lives with:: Self   Do you feel safe going back to the place where you live?: Yes          Current home services: Home RN, Home PT, Homehealth aide (Active w/[facility] -HHRN/PT/aide)    Activities of Daily Living   ADL Screening (condition at time of admission) Independently performs ADLs?: Yes (appropriate for developmental age) Is the patient deaf or have difficulty hearing?: No Does the patient have difficulty seeing, even when wearing glasses/contacts?: No Does the patient have difficulty concentrating, remembering, or making decisions?: No  Permission Sought/Granted Permission sought to share information with : Case Manager Permission granted to share information with : Yes, Verbal Permission Granted              Emotional Assessment              Admission diagnosis:  Dizziness [R42] Postural dizziness with presyncope [R42, R55] Patient Active Problem List    Diagnosis Date Noted   Postural dizziness with presyncope 08/06/2023   Dizziness 08/06/2023   SOB (shortness of breath) 06/18/2023   Near syncope 05/22/2023   Constipation 05/22/2023   UTI (urinary tract infection) 02/14/2023   Urinary tract infection 02/13/2023   COPD (chronic obstructive pulmonary disease) (HCC) 02/13/2023   Generalized weakness 02/13/2023   Acute postoperative anemia due to expected blood loss 12/17/2021   Closed right hip fracture (HCC) 12/15/2021   Stage 3a chronic kidney disease (CKD) (HCC) 12/15/2021   Acute urinary retention 12/15/2021   Acute cystitis without hematuria    Elevated troponin    Allergy to alpha-gal 12/18/2020   Other adverse food reactions, not elsewhere classified, subsequent encounter 12/18/2020   Rash and other nonspecific skin eruption 11/21/2020   Seasonal and perennial allergic rhinitis 11/21/2020   Solitary kidney 12/15/2018   Cough variant asthma 12/29/2014   Spinal stenosis in cervical region 12/13/2014   Radiculopathy, lumbar region 12/13/2014   Depression 05/03/2008   PERONEAL NEUROPATHY 05/03/2008   Malignant neoplasm of kidney excluding renal pelvis (HCC) 08/06/2007   Controlled type 2 diabetes mellitus without complication, without long-term current use of insulin  (HCC) 08/06/2007   GERD 08/06/2007   PULMONARY EMBOLISM, HX OF 08/06/2007   Hyperlipidemia 09/03/2006   Essential hypertension 09/03/2006   IBS 09/03/2006   PCP:  Valentin Skates, DO Pharmacy:  [id number] [address], [place] - [phone] AT [facility] OF [facility] & [address] [place] [address] Phone: [phone] Fax: [phone]     Social Drivers of Health (SDOH) Social History: SDOH Screenings   Food Insecurity: No Food Insecurity (08/07/2023)  Housing: Low Risk  (08/07/2023)  Transportation Needs: No Transportation Needs (08/07/2023)  Utilities: Not At Risk (08/07/2023)  Alcohol  Screen: Low Risk   (04/03/2018)  Depression (PHQ2-9): Low Risk  (04/07/2020)  Financial Resource Strain: Low Risk  (08/08/2020)  Social Connections: Unknown (08/07/2023)  Tobacco Use: Low Risk  (08/06/2023)   SDOH Interventions:     Readmission Risk Interventions    05/23/2023   11:53 AM 02/17/2023   10:42 AM 12/17/2021    1:08 PM  Readmission Risk Prevention Plan  Post Dischage Appt  Complete   Medication Screening  Complete   Transportation Screening Complete Complete Complete  PCP or Specialist Appt within 5-7 Days Complete  Complete  Home Care Screening Complete  Complete  Medication Review (RN CM) Complete  Complete

## 2023-08-07 NOTE — Assessment & Plan Note (Signed)
 Continuing home regimen of amlodipine Patient seems to have a flushing episode with as needed hydralazine earlier in the day, will use as needed labetalol instead for markedly elevated blood pressure

## 2023-08-07 NOTE — Progress Notes (Signed)
 PT Cancellation Note  Patient Details Name: Alexa [name] MRN: [id number] DOB: [birth year]   Cancelled Treatment:    Reason Eval/Treat Not Completed: Fatigue/lethargy limiting ability to participate; initiated assessment, though pt unable to get up to EOB reports feeling weak, sick and fatigue.  States did walk to bathroom at 6 am.  PT will re-attempt later as time permits.    Montie Portal 08/07/2023, 10:33 AM Micheline Portal, PT Acute Rehabilitation Services Office:[phone] 08/07/2023

## 2023-08-07 NOTE — Progress Notes (Signed)
 OT Cancellation Note  Patient Details Name: Alexa [name] MRN: [id number] DOB: 1930/05/27   Cancelled Treatment:    Reason Eval/Treat Not Completed: Fatigue/lethargy limiting ability to participate. Per PT pt unable to get up to EOB reports feeling weak, sick and fatigue. OT will follow up next available time  Jacques Karna Loose 08/07/2023, 11:03 AM

## 2023-08-07 NOTE — Progress Notes (Signed)
 PROGRESS NOTE   Alexa [name]  FMW:[id number] DOB: Jan 09, 1930 DOA: 08/06/2023 PCP: Valentin Skates, DO   Date of Service: the patient was seen and examined on 08/07/2023  Brief Narrative:   89 y.o. female with medical history significant for diabetes, hypertension, history of renal cell carcinoma status post nephrectomy presented to [facility] emergency department with complaints of dizziness.    Upon evaluation in the emergency department patient was still continuing to complain of unsteady gait and difficulty with ambulation.  Considering patient lives alone, hospitalist group was called to assess patient for admission the hospital for continued workup of ongoing neurologic complaints.   Assessment & Plan Postural dizziness with presyncope Patient continuing to complain of persisting generalized abdominal pain, worse in the lower abdomen with significant lightheadedness and severe weakness  Symptoms are somewhat atypical Concurrently, patient is exhibiting significant tachycardia Urine Alysis suggestive of urinary tract infection.  Will additionally obtain chest x-ray and CT imaging of the abdomen and pelvis Hydrating with intravenous isotonic fluids Lactic acidosis Lactic acidosis concerning for underlying infection or volume depletion Initiating empiric regimen of intravenous ceftriaxone  and hydrated with intravenous isotonic fluids Performing serial lactic acid levels to ensure downtrending and resolution Acute cystitis without hematuria Complaints of lower abdominal pain and dysuria Urinalysis suggestive of urinary tract infection Shitting intravenous ceftriaxone  Urine culture added on Urine culture from October growing out E. coli sensitive to ceftriaxone   Essential hypertension Continuing home regimen of amlodipine  Patient seems to have a flushing episode with as needed hydralazine  earlier in the day, will use as needed labetalol  instead for markedly elevated blood  pressure Type 2 diabetes mellitus with hyperglycemia, without long-term current use of insulin  (HCC) Patient been placed on Accu-Cheks before every meal and nightly with sliding scale insulin  Holding home regimen of hypoglycemics Hemoglobin A1c 6.1%. Diabetic Diet  Sinus arrhythmia Obtaining TSH Troponin unremarkable Obtaining twelve-lead EKG Monitoring on telemetry H/O left nephrectomy Monitoring renal function closely with serial chemistries     Subjective:  Patient complaining of generalized feelings of anxiousness and weakness with concurrent generalized moderate to severe abdominal pain and dysuria.  Pain is sharp in quality.  Pain has worsened since she arrived to the hospital.  Physical Exam:  Vitals:   08/07/23 1053 08/07/23 1213 08/07/23 1305 08/07/23 2048  BP: (!) 182/106 (!) 154/73 (!) 155/86 (!) 140/91  Pulse: 85 92 (!) 110 (!) 107  Resp:  20 17 20   Temp:  97.7 F (36.5 C) 98 F (36.7 C) 98.5 F (36.9 C)  TempSrc:  Oral Oral Oral  SpO2:  96% 97% 94%  Weight:      Height:         Constitutional: Awake alert and oriented x3, patient is in distress due to abdominal pain. Skin: no rashes, no lesions, poor Skin turgor noted. Eyes: Pupils are equally reactive to light.  No evidence of scleral icterus or conjunctival pallor.  ENMT: Moist mucous membranes noted.  Posterior pharynx clear of any exudate or lesions.   Respiratory: clear to auscultation bilaterally, no wheezing, no crackles. Normal respiratory effort. No accessory muscle use.  Cardiovascular: Tachycardic rate with regular rhythm.  No murmurs / rubs / gallops. No extremity edema. 2+ pedal pulses. No carotid bruits.  Abdomen: General Abdominal tenderness.  Abdomen is soft.  No evidence of intra-abdominal masses.  Positive bowel sounds noted in all quadrants.   Musculoskeletal: No joint deformity upper and lower extremities. Good ROM, no contractures. Normal muscle tone.    Data  Reviewed:  I have  personally reviewed and interpreted labs, imaging.  Significant findings are   CBC: Recent Labs  Lab 08/06/23 1329 08/07/23 1338  WBC 6.8 7.2  NEUTROABS  --  4.2  HGB 15.4* 15.3*  HCT 45.8 46.1*  MCV 91.8 90.6  PLT 273 307   Basic Metabolic Panel: Recent Labs  Lab 08/06/23 1329 08/07/23 1338  NA 137 137  K 4.4 3.7  CL 107 103  CO2 20* 23  GLUCOSE 164* 230*  BUN 18 22  CREATININE 0.57 0.78  CALCIUM 9.0 8.9  MG 2.4 2.3   GFR: Estimated Creatinine Clearance: 32.9 mL/min (by C-G formula based on SCr of 0.78 mg/dL). Liver Function Tests: Recent Labs  Lab 08/07/23 1338  AST 18  ALT 14  ALKPHOS 58  BILITOT 1.0  PROT 7.0  ALBUMIN  3.8    Coagulation Profile: Recent Labs  Lab 08/06/23 1329  INR 1.1     EKG: Personally reviewed.  Rhythm is sinus tachycardia with frequent PACs and heart rate of 114 bpm.  No dynamic ST segment changes appreciated.   Code Status:  DNR.  Code status decision has been confirmed with: patient Family Communication: Plan of care discussed with brother at the bedside.   Severity of Illness:  The appropriate patient status for this patient is OBSERVATION. Observation status is judged to be reasonable and necessary in order to provide the required intensity of service to ensure the patient's safety. The patient's presenting symptoms, physical exam findings, and initial radiographic and laboratory data in the context of their medical condition is felt to place them at decreased risk for further clinical deterioration. Furthermore, it is anticipated that the patient will be medically stable for discharge from the hospital within 2 midnights of admission.   Time spent:  59 minutes  Author:  Zachary JINNY Ba MD  08/07/2023 9:38 PM

## 2023-08-07 NOTE — Hospital Course (Addendum)
 89 y.o. female with medical history significant for diabetes, hypertension, history of renal cell carcinoma status post nephrectomy presented to [facility] emergency department with complaints of dizziness.    Upon evaluation in the emergency department patient was still continuing to complain of unsteady gait and difficulty with ambulation.  Considering patient lives alone, hospitalist group was called to assess patient for admission the hospital for continued workup of ongoing neurologic complaints.  After a thorough workup it was felt that the patient was suffering from a urinary tract infection.  Patient was initiated on intravenous ceftriaxone  during the hospitalization with dramatic improvement in her symptoms.  Patient's strength returned and complaints of dizziness resolved.  Of note, there was some question as to whether patient was going into atrial fibrillation during this hospitalization.  Of note, the patient has already been following up with Dr. Lavona with cardiology, last visit in November and was to have an outpatient Zio patch and echocardiogram.  Patient was not compliant with wearing the Zio patch and has yet to do the echocardiogram.  Multiple EKGs were obtained and telemetry have been reviewed throughout the hospitalization.  Patient was additionally discussed with cardiology.  It still appears that the patient simply suffering from bouts of sinus tachycardia with frequent PACs however at this point we cannot completely rule out atrial fibrillation.  Therefore patient is being placed on low-dose metoprolol  tartrate 12.5 mg twice daily.  Patient is been advised to continue her home regimen of aspirin  81 mg daily.  Patient was evaluated by physical therapy prior to discharge and they felt the patient would benefit from continued skilled therapy in the home health setting.  Arrangements are being made for the patient to go home with home health physical therapy services.  At  time of discharge patient is being transitioned to oral Omnicef  to complete her course of antibiotic therapy.  Patient is being discharged home in improved and stable condition with home health services on 08/08/2023.

## 2023-08-07 NOTE — Assessment & Plan Note (Signed)
 Patient continuing to complain of persisting generalized abdominal pain, worse in the lower abdomen with significant lightheadedness and severe weakness  Symptoms are somewhat atypical Concurrently, patient is exhibiting significant tachycardia Urine Alysis suggestive of urinary tract infection.  Will additionally obtain chest x-ray and CT imaging of the abdomen and pelvis Hydrating with intravenous isotonic fluids

## 2023-08-07 NOTE — Progress Notes (Signed)
 PT Cancellation Note  Patient Details Name: Alexa [name] MRN: [id number] DOB: 23-Apr-1930   Cancelled Treatment:    Reason Eval/Treat Not Completed: Patient declined, no reason specified; patient up on [facility] and reports feeling better after pain shot.  Declined up for ambulation stating needs to stay on [facility] and her brother is visiting.  Will follow up.    Montie Portal 08/07/2023, 3:50 PM Micheline Portal, PT Acute Rehabilitation Services Office:[phone] 08/07/2023

## 2023-08-07 NOTE — Assessment & Plan Note (Signed)
 Monitoring renal function closely with serial chemistries

## 2023-08-07 NOTE — Assessment & Plan Note (Signed)
 Patient been placed on Accu-Cheks before every meal and nightly with sliding scale insulin Holding home regimen of hypoglycemics Hemoglobin A1c 6.1%. Diabetic Diet

## 2023-08-07 NOTE — Assessment & Plan Note (Signed)
 Complaints of lower abdominal pain and dysuria Urinalysis suggestive of urinary tract infection Shitting intravenous ceftriaxone Urine culture added on Urine culture from October growing out E. coli sensitive to ceftriaxone

## 2023-08-07 NOTE — TOC Initial Note (Signed)
 Transition of Care [facility]) - Initial/Assessment Note    Patient Details  Name: Alexa [name] MRN: [id number] Date of Birth: [birth year]  Transition of Care [facility]) CM/SW Contact:    Bascom Service, RN Phone Number: 08/07/2023, 2:19 PM  Clinical Narrative:  Patient d/c plan home. She states she will set up own HHC with her PCP after d/c. Has own transport home.                 Expected Discharge Plan: Home/Self Care Barriers to Discharge: Continued Medical Work up   Patient Goals and CMS Choice Patient states their goals for this hospitalization and ongoing recovery are:: Home CMS Medicare.gov Compare Post Acute Care list provided to:: Patient Choice offered to / list presented to : Patient [place] ownership interest in [facility].provided to:: Patient    Expected Discharge Plan and Services   Discharge Planning Services: CM Consult   Living arrangements for the past 2 months: Single Family Home                                      Prior Living Arrangements/Services Living arrangements for the past 2 months: Single Family Home Lives with:: Self   Do you feel safe going back to the place where you live?: Yes          Current home services: Home RN, Home PT, Homehealth aide (Active w/[facility] -HHRN/PT/aide)    Activities of Daily Living   ADL Screening (condition at time of admission) Independently performs ADLs?: Yes (appropriate for developmental age) Is the patient deaf or have difficulty hearing?: No Does the patient have difficulty seeing, even when wearing glasses/contacts?: No Does the patient have difficulty concentrating, remembering, or making decisions?: No  Permission Sought/Granted Permission sought to share information with : Case Manager Permission granted to share information with : Yes, Verbal Permission Granted              Emotional Assessment              Admission diagnosis:  Dizziness [R42] Postural dizziness with  presyncope [R42, R55] Patient Active Problem List   Diagnosis Date Noted   Postural dizziness with presyncope 08/06/2023   Dizziness 08/06/2023   SOB (shortness of breath) 06/18/2023   Near syncope 05/22/2023   Constipation 05/22/2023   UTI (urinary tract infection) 02/14/2023   Urinary tract infection 02/13/2023   COPD (chronic obstructive pulmonary disease) (HCC) 02/13/2023   Generalized weakness 02/13/2023   Acute postoperative anemia due to expected blood loss 12/17/2021   Closed right hip fracture (HCC) 12/15/2021   Stage 3a chronic kidney disease (CKD) (HCC) 12/15/2021   Acute urinary retention 12/15/2021   Acute cystitis without hematuria    Elevated troponin    Allergy to alpha-gal 12/18/2020   Other adverse food reactions, not elsewhere classified, subsequent encounter 12/18/2020   Rash and other nonspecific skin eruption 11/21/2020   Seasonal and perennial allergic rhinitis 11/21/2020   Solitary kidney 12/15/2018   Cough variant asthma 12/29/2014   Spinal stenosis in cervical region 12/13/2014   Radiculopathy, lumbar region 12/13/2014   Depression 05/03/2008   PERONEAL NEUROPATHY 05/03/2008   Malignant neoplasm of kidney excluding renal pelvis (HCC) 08/06/2007   Controlled type 2 diabetes mellitus without complication, without long-term current use of insulin  (HCC) 08/06/2007   GERD 08/06/2007   PULMONARY EMBOLISM, HX OF 08/06/2007   Hyperlipidemia 09/03/2006  Essential hypertension 09/03/2006   IBS 09/03/2006   PCP:  Valentin Skates, DO Pharmacy:   [facility] [phone] [place], [place] - [phone] AT [facility] & [address] [place] [address] Phone: [phone] Fax: [phone]     Social Drivers of Health (SDOH) Social History: SDOH Screenings   Food Insecurity: No Food Insecurity (08/07/2023)  Housing: Low Risk  (08/07/2023)  Transportation Needs: No Transportation Needs (08/07/2023)  Utilities: Not At  Risk (08/07/2023)  Alcohol  Screen: Low Risk  (04/03/2018)  Depression (PHQ2-9): Low Risk  (04/07/2020)  Financial Resource Strain: Low Risk  (08/08/2020)  Social Connections: Socially Isolated (08/07/2023)  Tobacco Use: Low Risk  (08/06/2023)   SDOH Interventions:     Readmission Risk Interventions    05/23/2023   11:53 AM 02/17/2023   10:42 AM 12/17/2021    1:08 PM  Readmission Risk Prevention Plan  Post Dischage Appt  Complete   Medication Screening  Complete   Transportation Screening Complete Complete Complete  PCP or Specialist Appt within 5-7 Days Complete  Complete  Home Care Screening Complete  Complete  Medication Review (RN CM) Complete  Complete

## 2023-08-07 NOTE — Care Management Obs Status (Signed)
 MEDICARE OBSERVATION STATUS NOTIFICATION   Patient Details  Name: Alexa [name] MRN: [id number] Date of Birth: 07/19/1930   Medicare Observation Status Notification Given:  Yes    MahabirOlegario Messier, RN 08/07/2023, 2:15 PM

## 2023-08-07 NOTE — Assessment & Plan Note (Signed)
 Lactic acidosis concerning for underlying infection or volume depletion Initiating empiric regimen of intravenous ceftriaxone and hydrated with intravenous isotonic fluids Performing serial lactic acid levels to ensure downtrending and resolution

## 2023-08-07 NOTE — Assessment & Plan Note (Signed)
 Obtaining TSH Troponin unremarkable Obtaining twelve-lead EKG Monitoring on telemetry

## 2023-08-08 ENCOUNTER — Other Ambulatory Visit (HOSPITAL_COMMUNITY): Payer: Self-pay

## 2023-08-08 DIAGNOSIS — E872 Acidosis, unspecified: Secondary | ICD-10-CM | POA: Diagnosis not present

## 2023-08-08 DIAGNOSIS — R42 Dizziness and giddiness: Secondary | ICD-10-CM | POA: Diagnosis not present

## 2023-08-08 DIAGNOSIS — Z905 Acquired absence of kidney: Secondary | ICD-10-CM | POA: Diagnosis not present

## 2023-08-08 DIAGNOSIS — E1165 Type 2 diabetes mellitus with hyperglycemia: Secondary | ICD-10-CM | POA: Diagnosis not present

## 2023-08-08 DIAGNOSIS — R55 Syncope and collapse: Secondary | ICD-10-CM | POA: Diagnosis not present

## 2023-08-08 DIAGNOSIS — I10 Essential (primary) hypertension: Secondary | ICD-10-CM | POA: Diagnosis not present

## 2023-08-08 DIAGNOSIS — I498 Other specified cardiac arrhythmias: Secondary | ICD-10-CM | POA: Diagnosis not present

## 2023-08-08 DIAGNOSIS — N3000 Acute cystitis without hematuria: Secondary | ICD-10-CM | POA: Diagnosis not present

## 2023-08-08 LAB — CBC WITH DIFFERENTIAL/PLATELET
Abs Immature Granulocytes: 0.03 10*3/uL (ref 0.00–0.07)
Basophils Absolute: 0.1 10*3/uL (ref 0.0–0.1)
Basophils Relative: 1 %
Eosinophils Absolute: 0.4 10*3/uL (ref 0.0–0.5)
Eosinophils Relative: 5 %
HCT: 44.3 % (ref 36.0–46.0)
Hemoglobin: 14.6 g/dL (ref 12.0–15.0)
Immature Granulocytes: 0 %
Lymphocytes Relative: 26 %
Lymphs Abs: 2.1 10*3/uL (ref 0.7–4.0)
MCH: 30.4 pg (ref 26.0–34.0)
MCHC: 33 g/dL (ref 30.0–36.0)
MCV: 92.3 fL (ref 80.0–100.0)
Monocytes Absolute: 0.6 10*3/uL (ref 0.1–1.0)
Monocytes Relative: 8 %
Neutro Abs: 5 10*3/uL (ref 1.7–7.7)
Neutrophils Relative %: 60 %
Platelets: UNDETERMINED 10*3/uL (ref 150–400)
RBC: 4.8 MIL/uL (ref 3.87–5.11)
RDW: 13.4 % (ref 11.5–15.5)
WBC: 8.2 10*3/uL (ref 4.0–10.5)
nRBC: 0 % (ref 0.0–0.2)

## 2023-08-08 LAB — COMPREHENSIVE METABOLIC PANEL
ALT: 10 U/L (ref 0–44)
AST: 22 U/L (ref 15–41)
Albumin: 3.4 g/dL — ABNORMAL LOW (ref 3.5–5.0)
Alkaline Phosphatase: 54 U/L (ref 38–126)
Anion gap: 12 (ref 5–15)
BUN: 19 mg/dL (ref 8–23)
CO2: 23 mmol/L (ref 22–32)
Calcium: 9.4 mg/dL (ref 8.9–10.3)
Chloride: 105 mmol/L (ref 98–111)
Creatinine, Ser: 0.73 mg/dL (ref 0.44–1.00)
GFR, Estimated: >60 mL/min (ref >60)
Glucose, Bld: 138 mg/dL — ABNORMAL HIGH (ref 70–99)
Potassium: 4.2 mmol/L (ref 3.5–5.1)
Sodium: 140 mmol/L (ref 135–145)
Total Bilirubin: 0.7 mg/dL (ref 0.0–1.2)
Total Protein: 6.6 g/dL (ref 6.5–8.1)

## 2023-08-08 LAB — GLUCOSE, CAPILLARY
Glucose-Capillary: 143 mg/dL — ABNORMAL HIGH (ref 70–99)
Glucose-Capillary: 127 mg/dL — ABNORMAL HIGH (ref 70–99)

## 2023-08-08 LAB — LACTIC ACID, PLASMA: Lactic Acid, Venous: 1.8 mmol/L (ref 0.5–1.9)

## 2023-08-08 LAB — MAGNESIUM: Magnesium: 2.4 mg/dL (ref 1.7–2.4)

## 2023-08-08 MED ORDER — ASPIRIN 81 MG PO TBEC
81.0000 mg | DELAYED_RELEASE_TABLET | Freq: Every day | ORAL | 2 refills | Status: AC
  Filled 2023-08-08: qty 90, 90d supply, fill #0

## 2023-08-08 MED ORDER — CEFDINIR 300 MG PO CAPS
300.0000 mg | ORAL_CAPSULE | Freq: Two times a day (BID) | ORAL | 0 refills | Status: AC
  Filled 2023-08-08: qty 8, 4d supply, fill #0

## 2023-08-08 MED ORDER — CEFDINIR 300 MG PO CAPS: 300.0000 mg | ORAL_CAPSULE | Freq: Two times a day (BID) | ORAL | 0 refills | Status: DC

## 2023-08-08 MED ORDER — METOPROLOL TARTRATE 25 MG PO TABS: 12.5000 mg | ORAL_TABLET | Freq: Two times a day (BID) | ORAL | 1 refills | Status: DC

## 2023-08-08 MED ORDER — METOPROLOL TARTRATE 25 MG PO TABS
12.5000 mg | ORAL_TABLET | Freq: Two times a day (BID) | ORAL | 1 refills | Status: DC
  Filled 2023-08-08 – 2023-09-29 (×2): qty 60, 60d supply, fill #0

## 2023-08-08 NOTE — Evaluation (Signed)
 Occupational Therapy Evaluation Patient Details Name: Alexa [name] MRN: [id number] DOB: 1929/12/17 Today's Date: 08/08/2023   History of Present Illness 89 y.o. female with hx of COPD, hypertension, diabetes, renal cell carcinoma with left-sided nephrectomy, spinal stenosis, chronic pain, recurrent UTI, who presents after acute onset of nausea and vomiting, presyncopal episode. Dx of possible UTI, presyncopal episode   Clinical Impression   Pt presents with decline in function and safety with ADLs and ADL mobility with impaired strength, balance and endurance. PTA pt lived at home alone and was Ind with ADLs/selfcare, light cooking ad used a product manager for mobility, has hired help for housekeeping and her brother provided transportation. Pt currently requires min A with LB ADLs and min A - CGA with mobility/transfers using RW. Pt would benefit from acute OT services to address impairments to maximize level of function and safety BP sitting EOB 154/95 After activity 141/91      If plan is discharge home, recommend the following: A little help with bathing/dressing/bathroom;A little help with walking and/or transfers;Help with stairs or ramp for entrance    Functional Status Assessment  Patient has had a recent decline in their functional status and demonstrates the ability to make significant improvements in function in a reasonable and predictable amount of time.  Equipment Recommendations  None recommended by OT    Recommendations for Other Services       Precautions / Restrictions Precautions Precautions: Fall Restrictions Weight Bearing Restrictions Per Provider Order: No      Mobility Bed Mobility Overal bed mobility: Needs Assistance Bed Mobility: Supine to Sit     Supine to sit: Supervision          Transfers Overall transfer level: Needs assistance Equipment used: Rolling walker (2 wheels) Transfers: Sit to/from Stand, Bed to chair/wheelchair/BSC Sit to Stand:  Min assist, Contact guard assist     Step pivot transfers: Contact guard assist     General transfer comment: min A initially sit - stand from EOB, CGA from commode      Balance Overall balance assessment: Needs assistance Sitting-balance support: Feet supported Sitting balance-Leahy Scale: Good     Standing balance support: Bilateral upper extremity supported, During functional activity Standing balance-Leahy Scale: Poor                             ADL either performed or assessed with clinical judgement   ADL Overall ADL's : Needs assistance/impaired Eating/Feeding: Independent   Grooming: Wash/dry hands;Wash/dry face;Contact guard assist;Standing   Upper Body Bathing: Supervision/ safety   Lower Body Bathing: Minimal assistance   Upper Body Dressing : Supervision/safety   Lower Body Dressing: Minimal assistance   Toilet Transfer: Contact guard assist;Ambulation;Rolling walker (2 wheels);Grab bars;Regular Toilet   Toileting- Clothing Manipulation and Hygiene: Contact guard assist;Sit to/from stand       Functional mobility during ADLs: Minimal assistance;Contact guard assist;Rolling walker (2 wheels)       Vision Baseline Vision/History: 1 Wears glasses Ability to See in Adequate Light: 0 Adequate Patient Visual Report: No change from baseline       Perception         Praxis         Pertinent Vitals/Pain Pain Assessment Pain Assessment: No/denies pain Pain Score: 0-No pain Pain Intervention(s): Monitored during session     Extremity/Trunk Assessment Upper Extremity Assessment Upper Extremity Assessment: Generalized weakness   Lower Extremity Assessment Lower Extremity Assessment: Defer to PT evaluation  Communication Communication Communication: No apparent difficulties   Cognition Arousal: Alert Behavior During Therapy: WFL for tasks assessed/performed Overall Cognitive Status: Within Functional Limits for tasks  assessed                                 General Comments: very pleasant, talkative     General Comments       Exercises     Shoulder Instructions      Home Living Family/patient expects to be discharged to:: Private residence Living Arrangements: Alone Available Help at Discharge: Family Type of Home: House Home Access: Stairs to enter Secretary/administrator of Steps: 5 Entrance Stairs-Rails: Right;Left Home Layout: One level     Bathroom Shower/Tub: Chief Strategy Officer: Standard     Home Equipment: Tub bench;Rollator (4 wheels);BSC/3in1;Adaptive equipment Adaptive Equipment: Reacher Additional Comments: brother helps with groceries and taking to appointments      Prior Functioning/Environment Prior Level of Function : Independent/Modified Independent             Mobility Comments: uses rollater ADLs Comments: Ind with ADLs, light cooking, doesn't drive, has a lady that helps with cleaning        OT Problem List: Decreased strength;Impaired balance (sitting and/or standing);Decreased activity tolerance;Decreased knowledge of use of DME or AE      OT Treatment/Interventions: Self-care/ADL training;DME and/or AE instruction;Therapeutic activities;Balance training;Therapeutic exercise;Patient/family education    OT Goals(Current goals can be found in the care plan section) Acute Rehab OT Goals Patient Stated Goal: go home OT Goal Formulation: With patient Time For Goal Achievement: 08/22/23 Potential to Achieve Goals: Good ADL Goals Pt Will Perform Grooming: with supervision;with set-up;standing Pt Will Perform Upper Body Bathing: with set-up;with modified independence Pt Will Perform Lower Body Bathing: with contact guard assist;with supervision;sit to/from stand Pt Will Perform Upper Body Dressing: with set-up;with modified independence Pt Will Perform Lower Body Dressing: with contact guard assist;with supervision;sit to/from  stand Pt Will Transfer to Toilet: with supervision;ambulating Pt Will Perform Toileting - Clothing Manipulation and hygiene: with supervision;with modified independence;sit to/from stand  OT Frequency: Min 1X/week    Co-evaluation              AM-PAC OT 6 Clicks Daily Activity     Outcome Measure Help from another person eating meals?: None Help from another person taking care of personal grooming?: A Little Help from another person toileting, which includes using toliet, bedpan, or urinal?: A Little Help from another person bathing (including washing, rinsing, drying)?: A Little Help from another person to put on and taking off regular upper body clothing?: A Little Help from another person to put on and taking off regular lower body clothing?: A Little 6 Click Score: 19   End of Session Equipment Utilized During Treatment: Gait belt;Rolling walker (2 wheels)  Activity Tolerance: Patient tolerated treatment well Patient left: in chair;with call bell/phone within reach  OT Visit Diagnosis: Muscle weakness (generalized) (M62.81);Other abnormalities of gait and mobility (R26.89)                Time: 9060-8995 OT Time Calculation (min): 25 min Charges:  OT General Charges $OT Visit: 1 Visit OT Evaluation $OT Eval Low Complexity: 1 Low OT Treatments $Therapeutic Activity: 8-22 mins    Jacques Karna Loose 08/08/2023, 11:22 AM

## 2023-08-08 NOTE — Evaluation (Signed)
 Physical Therapy Evaluation Patient Details Name: Alexa [name] MRN: [id number] DOB: 06/29/1930 Today's Date: 08/08/2023  History of Present Illness  89 y.o. female with hx of COPD, hypertension, diabetes, renal cell carcinoma with left-sided nephrectomy, spinal stenosis, chronic pain, recurrent UTI, who presents after acute onset of nausea and vomiting, presyncopal episode. Dx of possible UTI, presyncopal episode  Clinical Impression  Pt admitted with above diagnosis.  Pt currently with functional limitations due to the deficits listed below (see PT Problem List). Pt will benefit from acute skilled PT to increase their independence and safety with mobility to allow discharge.  Pt ambulated in hallway with RW (typically uses rollator) and reports no dizziness today.  Pt reports feeling much better and believes she is able to return home alone.  Pt reports being in the middle of changing home health agencies but agreeable to HHPT.  Pt states she feels able to d/c home today if medically ready.         If plan is discharge home, recommend the following:     Can travel by private vehicle        Equipment Recommendations None recommended by PT  Recommendations for Other Services       Functional Status Assessment Patient has had a recent decline in their functional status and demonstrates the ability to make significant improvements in function in a reasonable and predictable amount of time.     Precautions / Restrictions Precautions Precautions: Fall Restrictions Weight Bearing Restrictions Per Provider Order: No      Mobility  Bed Mobility               General bed mobility comments: pt in recliner    Transfers Overall transfer level: Needs assistance Equipment used: Rolling walker (2 wheels) Transfers: Sit to/from Stand Sit to Stand: Contact guard assist           General transfer comment: verbal cues for UEs for self assist    Ambulation/Gait Ambulation/Gait  assistance: Contact guard assist Gait Distance (Feet): 180 Feet Assistive device: Rolling walker (2 wheels) Gait Pattern/deviations: Step-through pattern, Decreased stride length, Narrow base of support       General Gait Details: verbal cues for RW positioning, steady with RW, denies any dizziness  Stairs            Wheelchair Mobility     Tilt Bed    Modified Rankin (Stroke Patients Only)       Balance Overall balance assessment:  (last fall was 2 years ago)         Standing balance support: Bilateral upper extremity supported, During functional activity Standing balance-Leahy Scale: Poor                               Pertinent Vitals/Pain Pain Assessment Pain Assessment: No/denies pain Pain Intervention(s): Monitored during session    Home Living Family/patient expects to be discharged to:: Private residence Living Arrangements: Alone Available Help at Discharge: Family Type of Home: House Home Access: Stairs to enter Entrance Stairs-Rails: Doctor, General Practice of Steps: 5   Home Layout: One level Home Equipment: Tub bench;Rollator (4 wheels);BSC/3in1;Adaptive equipment Additional Comments: brother helps with groceries and taking to appointments    Prior Function Prior Level of Function : Independent/Modified Independent             Mobility Comments: uses rollater ADLs Comments: Ind with ADLs, light cooking, doesn't drive, has a lady that  helps with cleaning     Extremity/Trunk Assessment   Upper Extremity Assessment Upper Extremity Assessment: Generalized weakness    Lower Extremity Assessment Lower Extremity Assessment: Generalized weakness    Cervical / Trunk Assessment Cervical / Trunk Assessment: Kyphotic  Communication   Communication Communication: No apparent difficulties  Cognition Arousal: Alert Behavior During Therapy: WFL for tasks assessed/performed Overall Cognitive Status: Within Functional  Limits for tasks assessed                                 General Comments: very pleasant, talkative        General Comments      Exercises     Assessment/Plan    PT Assessment Patient needs continued PT services  PT Problem List Decreased strength;Decreased activity tolerance;Decreased balance;Decreased mobility;Decreased knowledge of use of DME       PT Treatment Interventions Gait training;DME instruction;Balance training;Functional mobility training;Therapeutic activities;Therapeutic exercise;Patient/family education    PT Goals (Current goals can be found in the Care Plan section)  Acute Rehab PT Goals PT Goal Formulation: With patient Time For Goal Achievement: 08/15/23 Potential to Achieve Goals: Good    Frequency Min 1X/week     Co-evaluation               AM-PAC PT 6 Clicks Mobility  Outcome Measure Help needed turning from your back to your side while in a flat bed without using bedrails?: A Little Help needed moving from lying on your back to sitting on the side of a flat bed without using bedrails?: A Little Help needed moving to and from a bed to a chair (including a wheelchair)?: A Little Help needed standing up from a chair using your arms (e.g., wheelchair or bedside chair)?: A Little Help needed to walk in hospital room?: A Little Help needed climbing 3-5 steps with a railing? : A Little 6 Click Score: 18    End of Session   Activity Tolerance: Patient tolerated treatment well Patient left: in chair;with call bell/phone within reach   PT Visit Diagnosis: Difficulty in walking, not elsewhere classified (R26.2);Muscle weakness (generalized) (M62.81)    Time: 8869-8851 PT Time Calculation (min) (ACUTE ONLY): 18 min   Charges:   PT Evaluation $PT Eval Low Complexity: 1 Low   PT General Charges $$ ACUTE PT VISIT: 1 Visit       Tari PT, DPT Physical Therapist Acute Rehabilitation Services Office:  [phone]   Tari CROME Payson 08/08/2023, 12:21 PM

## 2023-08-08 NOTE — Plan of Care (Signed)
  Problem: Education: Goal: Knowledge of General Education information will improve Description: Including pain rating scale, medication(s)/side effects and non-pharmacologic comfort measures Outcome: Completed/Met   Problem: Health Behavior/Discharge Planning: Goal: Ability to manage health-related needs will improve Outcome: Completed/Met   Problem: Clinical Measurements: Goal: Respiratory complications will improve Outcome: Completed/Met   Problem: Activity: Goal: Risk for activity intolerance will decrease Outcome: Completed/Met   Problem: Pain Management: Goal: General experience of comfort will improve Outcome: Completed/Met  Reviewed all dc instructions with pt including medications and follow up appointments. Pt verbalized understanding.   Cipriana Biller, Lonell Louder, RN

## 2023-08-08 NOTE — TOC Transition Note (Signed)
 Transition of Care [facility]) - Discharge Note   Patient Details  Name: Alexa [name] MRN: [id number] Date of Birth: 06-Dec-1929  [facility] CM/SW Contact:  Bascom Service, RN Phone Number: 08/08/2023, 1:43 PM   Clinical Narrative:d/c home. Patient will provide own [facility] services with her PCP even though HHC ordered-MD updated. Left vm w/Ronnie brother that patient can get private duty aide on own(out of pocket cost)No further CM needs.       Final next level of care: Home/Self Care Barriers to Discharge: No Barriers Identified   Patient Goals and CMS Choice Patient states their goals for this hospitalization and ongoing recovery are:: Home CMS Medicare.gov Compare Post Acute Care list provided to:: Patient Choice offered to / list presented to : Patient [place] ownership interest in [facility].provided to:: Patient    Discharge Placement                       Discharge Plan and Services Additional resources added to the After Visit Summary for     Discharge Planning Services: CM Consult                                 Social Drivers of Health (SDOH) Interventions SDOH Screenings   Food Insecurity: No Food Insecurity (08/07/2023)  Housing: Low Risk  (08/07/2023)  Transportation Needs: No Transportation Needs (08/07/2023)  Utilities: Not At Risk (08/07/2023)  Alcohol  Screen: Low Risk  (04/03/2018)  Depression (PHQ2-9): Low Risk  (04/07/2020)  Financial Resource Strain: Low Risk  (08/08/2020)  Social Connections: Socially Isolated (08/07/2023)  Tobacco Use: Low Risk  (08/06/2023)     Readmission Risk Interventions    05/23/2023   11:53 AM 02/17/2023   10:42 AM 12/17/2021    1:08 PM  Readmission Risk Prevention Plan  Post Dischage Appt  Complete   Medication Screening  Complete   Transportation Screening Complete Complete Complete  PCP or Specialist Appt within 5-7 Days Complete  Complete  Home Care Screening Complete  Complete  Medication  Review (RN CM) Complete  Complete

## 2023-08-08 NOTE — Plan of Care (Signed)
  Problem: Education: Goal: Knowledge of General Education information will improve Description: Including pain rating scale, medication(s)/side effects and non-pharmacologic comfort measures Outcome: Completed/Met   Problem: Clinical Measurements: Goal: Respiratory complications will improve Outcome: Completed/Met   Problem: Activity: Goal: Risk for activity intolerance will decrease Outcome: Completed/Met   Problem: Pain Management: Goal: General experience of comfort will improve Outcome: Completed/Met

## 2023-08-08 NOTE — Plan of Care (Signed)
  Problem: Clinical Measurements: Goal: Will remain free from infection Outcome: Progressing   Problem: Activity: Goal: Risk for activity intolerance will decrease Outcome: Progressing   Problem: Coping: Goal: Level of anxiety will decrease Outcome: Progressing   Problem: Pain Management: Goal: General experience of comfort will improve Outcome: Progressing

## 2023-08-08 NOTE — Progress Notes (Addendum)
   Patient currently admitted to the hospital with acute cystitis, postural dizziness with presyncope.  Her dizziness has improved with treatment of her UTI per hospitalist.  On telemetry, it has been noted that patient occasionally has irregular heart rhythm.  She is asymptomatic and does not have palpitations. EKG from 08/07/2023 showed sinus tachycardia with PACs, heart rate 114 bpm.  EKG from 08/08/2023 showed sinus rhythm with PACs, heart rate 89 bpm.   When patient was last seen by Dr. Lavona in 05/2023, her EKG also showed ectopy.  Patient was reportedly not having symptomatic tachypalpitations.  Recommended 3-day monitor, but patient did not wear a monitor as prescribed.  She is scheduled for an echocardiogram on 1/22.  Per review of vital signs, BP is a bit elevated.  In the 140s-150s systolic, 70s-90s diastolic.  Recommended starting metoprolol  12.5 mg twice daily for PAC suppression.  Arranged follow-up in our office on 1/16.  Rollo FABIENE Louder, PA-C 08/08/2023 1:24 PM

## 2023-08-08 NOTE — Discharge Summary (Addendum)
 Physician Discharge Summary   Patient: Alexa [name] MRN: [id number] DOB: [birth year]  Admit date:     08/06/2023  Discharge date: 08/08/23  Discharge Physician: Zachary JINNY Ba   PCP: Valentin Skates, DO   Recommendations at discharge:   Please take all prescribed medications exactly as instructed including the remaining course of antibiotics (Omnicef ) and metoprolol  which is a medication that helps regulate your heart rate.. Please consume a low sodium diet Please increase your physical activity as tolerated in the walker or other assistive device. Please maintain all outpatient follow-up appointments including follow-up with your primary care provider and Meng Hao (PA for Dr. Lavona) with Cardiology. Please return to the emergency department if you develop worsening shortness of breath, fevers in excess of 100.4 F, weakness or inability to tolerate oral intake.  Discharge Diagnoses: Principal Problem:   Postural dizziness with presyncope Active Problems:   Essential hypertension   Acute cystitis without hematuria   Sinus arrhythmia   Type 2 diabetes mellitus with hyperglycemia, without long-term current use of insulin  (HCC)   H/O left nephrectomy   Lactic acidosis  Resolved Problems:   * No resolved hospital problems. *   Hospital Course:  89 y.o. female with medical history significant for diabetes, hypertension, history of renal cell carcinoma status post nephrectomy presented to [facility] emergency department with complaints of dizziness.    Upon evaluation in the emergency department patient was still continuing to complain of unsteady gait and difficulty with ambulation.  Considering patient lives alone, hospitalist group was called to assess patient for admission the hospital for continued workup of ongoing neurologic complaints.  After a thorough workup it was felt that the patient was suffering from a urinary tract infection.  Patient was initiated on  intravenous ceftriaxone  during the hospitalization with dramatic improvement in her symptoms.  Patient's strength returned and complaints of dizziness resolved.  Of note, there was some question as to whether patient was going into atrial fibrillation during this hospitalization.  Of note, the patient has already been following up with Dr. Lavona with cardiology, last visit in November and was to have an outpatient Zio patch and echocardiogram.  Patient was not compliant with wearing the Zio patch and has yet to do the echocardiogram.  Multiple EKGs were obtained and telemetry have been reviewed throughout the hospitalization.  Patient was additionally discussed with cardiology.  It still appears that the patient simply suffering from bouts of sinus tachycardia with frequent PACs however at this point we cannot completely rule out atrial fibrillation.  Therefore patient is being placed on low-dose metoprolol  tartrate 12.5 mg twice daily.  Patient is been advised to continue her home regimen of aspirin  81 mg daily.  Patient was evaluated by physical therapy prior to discharge and they felt the patient would benefit from continued skilled therapy in the home health setting.  Arrangements are being made for the patient to go home with home health physical therapy services.  At time of discharge patient is being transitioned to oral Omnicef  to complete her course of antibiotic therapy.  Patient is being discharged home in improved and stable condition with home health services on 08/08/2023.  Consultants: None, phone discussion with Cardiology. Procedures performed:  None Disposition: Home health Diet recommendation:  Discharge Diet Orders (From admission, onward)     Start     Ordered   08/08/23 0000  Diet - low sodium heart healthy        08/08/23 1327  Cardiac diet  DISCHARGE MEDICATION: Allergies as of 08/08/2023       Reactions   Lisinopril  Other (See Comments), Cough    Discontinued by MD Darlean   Codeine  Nausea Only   Hydralazine  Anxiety   Patient get anxious, flushed and tachycardic   Verapamil Other (See Comments)   Pain in feet        Medication List     TAKE these medications    acetaminophen  500 MG tablet Commonly known as: TYLENOL  Take 500 mg by mouth 2 (two) times daily.   albuterol  108 (90 Base) MCG/ACT inhaler Commonly known as: VENTOLIN  HFA Inhale 2 puffs into the lungs every 6 (six) hours as needed for wheezing or shortness of breath.   amLODipine  5 MG tablet Commonly known as: NORVASC  Take 2 tablets (10 mg total) by mouth daily. What changed:  how much to take when to take this   aspirin  EC 81 MG tablet Take 1 tablet (81 mg total) by mouth daily. Swallow whole.   cefdinir  300 MG capsule Commonly known as: OMNICEF  Take 1 capsule (300 mg total) by mouth 2 (two) times daily for 4 days.   cholecalciferol  25 MCG (1000 UNIT) tablet Commonly known as: VITAMIN D3 Take 1,000 Units by mouth daily.   CRANBERRY PO Take 2 tablets by mouth 2 (two) times daily.   DIABETIC BASICS HEALTHY FOOT EX Apply 1 application  topically See admin instructions. Apply to the feet at bedtime   ESTRACE  VAGINAL 0.1 MG/GM vaginal cream Generic drug: estradiol  See admin instructions. Apply locally every other night at bedtime   fluticasone  50 MCG/ACT nasal spray Commonly known as: FLONASE  Place 1 spray into both nostrils in the morning and at bedtime.   HYDROcodone -acetaminophen  5-325 MG tablet Commonly known as: NORCO/VICODIN Take 1 tablet by mouth See admin instructions. Take 1 tablet by mouth in the morning and 1 tablet between midnight and morning   hydrOXYzine  10 MG tablet Commonly known as: ATARAX  Take 10 mg by mouth daily as needed for anxiety.   metoprolol  tartrate 25 MG tablet Commonly known as: LOPRESSOR  Take 0.5 tablets (12.5 mg total) by mouth 2 (two) times daily.   PreserVision AREDS 2 Caps Take 1 capsule by mouth 2 (two)  times daily after a meal.   RED WINE COMPLEX PO Take 1 capsule by mouth daily.   sitaGLIPtin  50 MG tablet Commonly known as: JANUVIA  Take 50 mg by mouth daily as needed (for elevated BGL).   Systane Complete PF 0.6 % Soln Generic drug: Propylene Glycol (PF) Place 1 drop into both eyes 3 (three) times daily as needed (for irritation).        Follow-up Information     Valentin Skates, DO. Schedule an appointment as soon as possible for a visit.   Specialty: Internal Medicine Contact information: [address] [place] [address] [phone]         Janene Boer, [place] Follow up on 08/14/2023.   Specialties: Cardiology, Radiology Why: 8:50AM Contact information: [address] [place] [address] [phone]                 Discharge Exam: Fredricka Weights   08/06/23 1527 08/06/23 2035  Weight: 50 kg 49.5 kg    Constitutional: Awake alert and oriented x3, no associated distress.   Respiratory: clear to auscultation bilaterally, no wheezing, no crackles. Normal respiratory effort. No accessory muscle use.  Cardiovascular: Regular rate and rhythm with occasional irregular rhythm, no murmurs / rubs /  gallops. No extremity edema. 2+ pedal pulses. No carotid bruits.  Abdomen: Abdomen is soft and nontender.  No evidence of intra-abdominal masses.  Positive bowel sounds noted in all quadrants.   Musculoskeletal: No joint deformity upper and lower extremities. Good ROM, no contractures.  Poor muscle tone.     Condition at discharge: fair  The results of significant diagnostics from this hospitalization (including imaging, microbiology, ancillary and laboratory) are listed below for reference.   Imaging Studies: DG Chest 1 View Result Date: 08/07/2023 CLINICAL DATA:  Shortness of breath EXAM: CHEST  1 VIEW COMPARISON:  Chest x-ray 02/14/2023 FINDINGS: The heart size and mediastinal contours are within normal limits. Aorta is ectatic. Both lungs are clear. The  visualized skeletal structures are unremarkable. IMPRESSION: No active disease. Electronically Signed   By: Greig Pique M.D.   On: 08/07/2023 16:54   CT ABDOMEN PELVIS W CONTRAST Result Date: 08/07/2023 CLINICAL DATA:  Acute abdominal pain EXAM: CT ABDOMEN AND PELVIS WITH CONTRAST TECHNIQUE: Multidetector CT imaging of the abdomen and pelvis was performed using the standard protocol following bolus administration of intravenous contrast. RADIATION DOSE REDUCTION: This exam was performed according to the departmental dose-optimization program which includes automated exposure control, adjustment of the mA and/or kV according to patient size and/or use of iterative reconstruction technique. CONTRAST:  OMNIPAQUE  IOHEXOL  300 MG/ML  SOLN COMPARISON:  CT abdomen and pelvis 05/21/2023 FINDINGS: Lower chest: There scattered chronic reticular opacities in the lung bases similar to the prior study. Hepatobiliary: No focal liver abnormality is seen. No gallstones, gallbladder wall thickening, or biliary dilatation. Pancreas: Unremarkable. No pancreatic ductal dilatation or surrounding inflammatory changes. Spleen: Normal in size without focal abnormality. Adrenals/Urinary Tract: Left kidney is absent. Right kidney, bladder and adrenal glands are within normal limits. Stomach/Bowel: Stomach is within normal limits. Appendix appears normal. No evidence of bowel wall thickening, distention, or inflammatory changes. There is sigmoid colon diverticulosis. Vascular/Lymphatic: Aorta and IVC are normal in size. There are atherosclerotic calcifications of the aorta. No enlarged lymph nodes are seen. Reproductive: Uterus and bilateral adnexa are unremarkable. Other: There is a small fat containing umbilical hernia. No ascites. Musculoskeletal: There stable chronic compression deformity of T12. Multilevel degenerative changes affect the spine. Right hip arthroplasty is present. IMPRESSION: 1. No acute localizing process in the  abdomen or pelvis. 2. Sigmoid colon diverticulosis. 3. Left nephrectomy. Aortic Atherosclerosis (ICD10-I70.0). Electronically Signed   By: Greig Pique M.D.   On: 08/07/2023 16:12   CT Head Wo Contrast Result Date: 08/06/2023 CLINICAL DATA:  Syncope/presyncope. Cerebrovascular cause suspected. Dizziness. EXAM: CT HEAD WITHOUT CONTRAST TECHNIQUE: Contiguous axial images were obtained from the base of the skull through the vertex without intravenous contrast. RADIATION DOSE REDUCTION: This exam was performed according to the departmental dose-optimization program which includes automated exposure control, adjustment of the mA and/or kV according to patient size and/or use of iterative reconstruction technique. COMPARISON:  10/18/2022 FINDINGS: Brain: No focal brainstem finding. 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 Cerebral hemispheres show generalized atrophy with chronic small-vessel ischemic changes of the white matter. No large vessel territory stroke. No mass, hemorrhage, hydrocephalus or extra-axial collection. Vascular: No abnormal vascular finding. Skull: Negative Sinuses/Orbits: Clear/normal Other: None IMPRESSION: No acute CT finding. 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 Generalized atrophy and chronic small-vessel ischemic changes of the cerebral hemispheric white matter. Electronically Signed   By: Oneil Officer M.D.   On: 08/06/2023 15:44    Microbiology: Results for orders placed or performed during the  hospital encounter of 05/21/23  SARS Coronavirus 2 by RT PCR (hospital order, performed in [facility] hospital lab) *cepheid single result test* Anterior Nasal Swab     Status: None   Collection Time: 05/21/23  5:23 PM   Specimen: Anterior Nasal Swab  Result Value Ref Range Status   SARS Coronavirus 2 by RT PCR NEGATIVE NEGATIVE Final    Comment: (NOTE) SARS-CoV-2 target nucleic acids are NOT DETECTED.  The SARS-CoV-2 RNA is generally detectable in upper and lower respiratory  specimens during the acute phase of infection. The lowest concentration of SARS-CoV-2 viral copies this assay can detect is 250 copies / mL. A negative result does not preclude SARS-CoV-2 infection and should not be used as the sole basis for treatment or other patient management decisions.  A negative result may occur with improper specimen collection / handling, submission of specimen other than nasopharyngeal swab, presence of viral mutation(s) within the areas targeted by this assay, and inadequate number of viral copies (<250 copies / mL). A negative result must be combined with clinical observations, patient history, and epidemiological information.  Fact Sheet for Patients:   roadlaptop.co.za  Fact Sheet for Healthcare Providers: http://kim-miller.com/  This test is not yet approved or  cleared by the [place]  FDA and has been authorized for detection and/or diagnosis of SARS-CoV-2 by FDA under an Emergency Use Authorization (EUA).  This EUA will remain in effect (meaning this test can be used) for the duration of the COVID-19 declaration under Section 564(b)(1) of the Act, 21 U.S.C. section 360bbb-3(b)(1), unless the authorization is terminated or revoked sooner.  Performed at [facility], [address] [place], [place] [address]   Urine Culture     Status: Abnormal   Collection Time: 05/21/23  8:33 PM   Specimen: Urine, Clean Catch  Result Value Ref Range Status   Specimen Description   Final    URINE, CLEAN CATCH Performed at [facility], [address], [place], [place] [address]    Special Requests   Final    NONE Performed at [facility], [address] [place], [place] [address]    Culture >=100,000 COLONIES/mL ESCHERICHIA COLI (A)  Final   Report Status 05/24/2023 FINAL  Final   Organism ID, Bacteria ESCHERICHIA COLI (A)  Final      Susceptibility    Escherichia coli - MIC*    AMPICILLIN  >=32 RESISTANT Resistant     CEFAZOLIN  <=4 SENSITIVE Sensitive     CEFEPIME <=0.12 SENSITIVE Sensitive     CEFTRIAXONE  <=0.25 SENSITIVE Sensitive     CIPROFLOXACIN <=0.25 SENSITIVE Sensitive     GENTAMICIN <=1 SENSITIVE Sensitive     IMIPENEM <=0.25 SENSITIVE Sensitive     NITROFURANTOIN <=16 SENSITIVE Sensitive     TRIMETH/SULFA <=20 SENSITIVE Sensitive     AMPICILLIN /SULBACTAM 16 INTERMEDIATE Intermediate     PIP/TAZO <=4 SENSITIVE Sensitive ug/mL    * >=100,000 COLONIES/mL ESCHERICHIA COLI    Labs: CBC: Recent Labs  Lab 08/06/23 1329 08/07/23 1338 08/08/23 0531  WBC 6.8 7.2 8.2  NEUTROABS  --  4.2 5.0  HGB 15.4* 15.3* 14.6  HCT 45.8 46.1* 44.3  MCV 91.8 90.6 92.3  PLT 273 307 PLATELET CLUMPS NOTED ON SMEAR, UNABLE TO ESTIMATE   Basic Metabolic Panel: Recent Labs  Lab 08/06/23 1329 08/07/23 1338 08/08/23 0531  NA 137 137 140  K 4.4 3.7 4.2  CL 107 103 105  CO2 20* 23 23  GLUCOSE 164*  230* 138*  BUN 18 22 19   CREATININE 0.57 0.78 0.73  CALCIUM 9.0 8.9 9.4  MG 2.4 2.3 2.4   Liver Function Tests: Recent Labs  Lab 08/07/23 1338 08/08/23 0531  AST 18 22  ALT 14 10  ALKPHOS 58 54  BILITOT 1.0 0.7  PROT 7.0 6.6  ALBUMIN  3.8 3.4*   CBG: Recent Labs  Lab 08/07/23 1307 08/07/23 1619 08/07/23 2116 08/08/23 0744 08/08/23 1131  GLUCAP 234* 168* 152* 143* 127*    Discharge time spent: greater than 30 minutes.  Signed: Zachary JINNY Ba, MD [facility] 08/08/2023

## 2023-08-08 NOTE — Discharge Instructions (Addendum)
 Please take all prescribed medications exactly as instructed including the remaining course of antibiotics (Omnicef ) and metoprolol  which is a medication that helps regulate your heart rate.. Please consume a low sodium diet Please increase your physical activity as tolerated in the walker or other assistive device. Please maintain all outpatient follow-up appointments including follow-up with your primary care provider and Meng Hao (PA for Dr. Lavona) with Cardiology. Please return to the emergency department if you develop worsening shortness of breath, fevers in excess of 100.4 F, weakness or inability to tolerate oral intake.

## 2023-08-09 ENCOUNTER — Other Ambulatory Visit (HOSPITAL_BASED_OUTPATIENT_CLINIC_OR_DEPARTMENT_OTHER): Payer: Self-pay

## 2023-08-10 LAB — URINE CULTURE: Culture: >=100000 — AB

## 2023-08-11 DIAGNOSIS — R002 Palpitations: Secondary | ICD-10-CM

## 2023-08-14 ENCOUNTER — Ambulatory Visit: Payer: PPO | Admitting: Physician Assistant

## 2023-08-15 DIAGNOSIS — M545 Low back pain, unspecified: Secondary | ICD-10-CM | POA: Diagnosis not present

## 2023-08-15 DIAGNOSIS — N1831 Chronic kidney disease, stage 3a: Secondary | ICD-10-CM | POA: Diagnosis not present

## 2023-08-15 DIAGNOSIS — F419 Anxiety disorder, unspecified: Secondary | ICD-10-CM | POA: Diagnosis not present

## 2023-08-15 DIAGNOSIS — Z85528 Personal history of other malignant neoplasm of kidney: Secondary | ICD-10-CM | POA: Diagnosis not present

## 2023-08-15 DIAGNOSIS — I10 Essential (primary) hypertension: Secondary | ICD-10-CM | POA: Diagnosis not present

## 2023-08-15 DIAGNOSIS — E1122 Type 2 diabetes mellitus with diabetic chronic kidney disease: Secondary | ICD-10-CM | POA: Diagnosis not present

## 2023-08-15 DIAGNOSIS — F0390 Unspecified dementia without behavioral disturbance: Secondary | ICD-10-CM | POA: Diagnosis not present

## 2023-08-15 DIAGNOSIS — I479 Paroxysmal tachycardia, unspecified: Secondary | ICD-10-CM | POA: Diagnosis not present

## 2023-08-15 DIAGNOSIS — M810 Age-related osteoporosis without current pathological fracture: Secondary | ICD-10-CM | POA: Diagnosis not present

## 2023-08-15 DIAGNOSIS — R2681 Unsteadiness on feet: Secondary | ICD-10-CM | POA: Diagnosis not present

## 2023-08-15 DIAGNOSIS — E441 Mild protein-calorie malnutrition: Secondary | ICD-10-CM | POA: Diagnosis not present

## 2023-08-15 DIAGNOSIS — E611 Iron deficiency: Secondary | ICD-10-CM | POA: Diagnosis not present

## 2023-08-15 NOTE — ED Provider Notes (Signed)
[place] [facility] FLOOR PROGRESSIVE CARE AND UROLOGY Provider Note   CSN: [id number] Arrival date & time: 08/06/23  1231     History  Chief Complaint  Patient presents with   Hypertension    Alexa [name] is a 89 y.o. female.  HPI     89 y.o. female with medical history significant for diabetes, hypertension, history of renal cell carcinoma status post nephrectomy comes to the ER due to dizziness. Secondarily, she is concerned of palpitations and elevated BP.  Patient is very sharp and independent, tells me she was in her usual state of health until this morning when she got a little bit dizzy and lightheaded. Pt got up off the couch where she was resting, she got very dizzy, with the floor spinning and felt like she might pass out.  EMS was called due to this dizziness, at which time the patient member that she forgot to take her morning dose of amlodipine.  Fire department helped her take her medication, and then transported her to the emergency department.  They noted initial blood pressure of 180/110 and at 1 point her blood pressure was over 200 systolic.  Patient also indicates that she has experienced off-and-on episodes of palpitations.  She is seeing cardiology for it, but no diagnosis was reached.  Even at rest, patient currently feels " foggy", " swimmy headed" but her symptoms do get worse when she sits up.  She denies any recent vomiting, diarrhea.  Patient denies any new medications.  She is not sure if she is having any palpitations during these episodes.  Home Medications Prior to Admission medications   Medication Sig Start Date End Date Taking? Authorizing Provider  acetaminophen (TYLENOL) 500 MG tablet Take 500 mg by mouth 2 (two) times daily.   Yes [provider]  albuterol (VENTOLIN HFA) 108 (90 Base) MCG/ACT inhaler Inhale 2 puffs into the lungs every 6 (six) hours as needed for wheezing or shortness of breath.   Yes [provider]  amLODipine  (NORVASC) 5 MG tablet Take 2 tablets (10 mg total) by mouth daily. Patient taking differently: Take 5 mg by mouth in the morning. 02/17/23  Yes Gherghe, Daylene Katayama, MD  aspirin EC 81 MG tablet Take 1 tablet (81 mg total) by mouth daily. Swallow whole. 08/08/23  Yes Shalhoub, Deno Lunger, MD  Benzalkonium Chloride (DIABETIC BASICS HEALTHY FOOT EX) Apply 1 application  topically See admin instructions. Apply to the feet at bedtime   Yes [provider]  cholecalciferol (VITAMIN D3) 25 MCG (1000 UNIT) tablet Take 1,000 Units by mouth daily.   Yes [provider]  CRANBERRY PO Take 2 tablets by mouth 2 (two) times daily.   Yes [provider]  ESTRACE VAGINAL 0.1 MG/GM vaginal cream See admin instructions. Apply locally every other night at bedtime 01/16/16  Yes [provider]  fluticasone (FLONASE) 50 MCG/ACT nasal spray Place 1 spray into both nostrils in the morning and at bedtime.   Yes [provider]  HYDROcodone-acetaminophen (NORCO/VICODIN) 5-325 MG tablet Take 1 tablet by mouth See admin instructions. Take 1 tablet by mouth in the morning and 1 tablet between midnight and morning   Yes [provider]  hydrOXYzine (ATARAX) 10 MG tablet Take 10 mg by mouth daily as needed for anxiety. 11/13/21  Yes [provider]  Misc Natural Products (RED WINE COMPLEX PO) Take 1 capsule by mouth daily.   Yes [provider]  Multiple Vitamins-Minerals (PRESERVISION AREDS  2) CAPS Take 1 capsule by mouth 2 (two) times daily after a meal.   Yes [provider]  sitaGLIPtin (JANUVIA) 50 MG tablet Take 50 mg by mouth daily as needed (for elevated BGL).   Yes [provider]  SYSTANE COMPLETE PF 0.6 % SOLN Place 1 drop into both eyes 3 (three) times daily as needed (for irritation).   Yes [provider]  metoprolol tartrate (LOPRESSOR) 25 MG tablet Take 0.5 tablets (12.5 mg total) by mouth 2 (two) times daily. 08/08/23 08/07/24   Shalhoub, Deno Lunger, MD  ferrous sulfate 325 (65 FE) MG tablet Take 1 tablet (325 mg total) by mouth daily with breakfast. 12/20/21 10/09/22  Erick Blinks, MD      Allergies    Lisinopril, Codeine, Hydralazine, and Verapamil    Review of Systems   Review of Systems  All other systems reviewed and are negative.   Physical Exam Updated Vital Signs BP (!) 149/83 (BP Location: Right Arm)   Pulse 75   Temp 98.4 F (36.9 C) (Oral)   Resp 16   Ht 5' (1.524 m)   Wt 49.5 kg   LMP  (LMP Unknown)   SpO2 96%   BMI 21.31 kg/m  Physical Exam Vitals and nursing note reviewed.  Constitutional:      Appearance: She is well-developed.  HENT:     Head: Atraumatic.  Cardiovascular:     Rate and Rhythm: Normal rate.  Pulmonary:     Effort: Pulmonary effort is normal.  Musculoskeletal:     Cervical back: Normal range of motion and neck supple.  Skin:    General: Skin is warm and dry.  Neurological:     Mental Status: She is alert and oriented to person, place, and time.     ED Results / Procedures / Treatments   Labs (all labs ordered are listed, but only abnormal results are displayed) Labs Reviewed  URINE CULTURE - Abnormal; Notable for the following components:      Result Value   Culture >=100,000 COLONIES/mL ESCHERICHIA COLI (*)    Organism ID, Bacteria ESCHERICHIA COLI (*)    All other components within normal limits  BASIC METABOLIC PANEL - Abnormal; Notable for the following components:   CO2 20 (*)    Glucose, Bld 164 (*)    All other components within normal limits  CBC - Abnormal; Notable for the following components:   Hemoglobin 15.4 (*)    All other components within normal limits  HEMOGLOBIN A1C - Abnormal; Notable for the following components:   Hgb A1c MFr Bld 6.1 (*)    All other components within normal limits  CBC WITH DIFFERENTIAL/PLATELET - Abnormal; Notable for the following components:   Hemoglobin 15.3 (*)    HCT 46.1 (*)    All other components  within normal limits  COMPREHENSIVE METABOLIC PANEL - Abnormal; Notable for the following components:   Glucose, Bld 230 (*)    All other components within normal limits  URINALYSIS, ROUTINE W REFLEX MICROSCOPIC - Abnormal; Notable for the following components:   Color, Urine STRAW (*)    APPearance CLOUDY (*)    Leukocytes,Ua LARGE (*)    Bacteria, UA MANY (*)    All other components within normal limits  GLUCOSE, CAPILLARY - Abnormal; Notable for the following components:   Glucose-Capillary 234 (*)    All other components within normal limits  LACTIC ACID, PLASMA - Abnormal; Notable for the following components:   Lactic Acid, Venous  2.7 (*)    All other components within normal limits  GLUCOSE, CAPILLARY - Abnormal; Notable for the following components:   Glucose-Capillary 168 (*)    All other components within normal limits  GLUCOSE, CAPILLARY - Abnormal; Notable for the following components:   Glucose-Capillary 152 (*)    All other components within normal limits  COMPREHENSIVE METABOLIC PANEL - Abnormal; Notable for the following components:   Glucose, Bld 138 (*)    Albumin 3.4 (*)    All other components within normal limits  GLUCOSE, CAPILLARY - Abnormal; Notable for the following components:   Glucose-Capillary 143 (*)    All other components within normal limits  GLUCOSE, CAPILLARY - Abnormal; Notable for the following components:   Glucose-Capillary 127 (*)    All other components within normal limits  MAGNESIUM  TSH  PROTIME-INR  APTT  MAGNESIUM  D-DIMER, QUANTITATIVE  LACTIC ACID, PLASMA  CBC WITH DIFFERENTIAL/PLATELET  MAGNESIUM  TROPONIN I (HIGH SENSITIVITY)  TROPONIN I (HIGH SENSITIVITY)  TROPONIN I (HIGH SENSITIVITY)    EKG EKG Interpretation Date/Time:  Wednesday August 06 2023 12:45:11 EST Ventricular Rate:  96 PR Interval:    QRS Duration:  76 QT Interval:  387 QTC Calculation: 490 R Axis:   -48  Text Interpretation: Atrial fibrillation  Inferior infarct, old Consider anterior infarct new onset AF Confirmed by Derwood Kaplan [phone]) on 08/06/2023 1:06:52 PM  Radiology No results found.  Procedures Procedures    Medications Ordered in ED Medications  lactated ringers infusion (0 mLs Intravenous Stopped 08/08/23 1350)  bisacodyl (DULCOLAX) suppository 10 mg (10 mg Rectal Given 08/07/23 2049)  iohexol (OMNIPAQUE) 300 MG/ML solution 30 mL (30 mLs Oral Contrast Given 08/07/23 1402)  iohexol (OMNIPAQUE) 300 MG/ML solution 100 mL (100 mLs Intravenous Contrast Given 08/07/23 1551)    ED Course/ Medical Decision Making/ A&P                                 Medical Decision Making Amount and/or Complexity of Data Reviewed Labs: ordered. Radiology: ordered.  Risk Decision regarding hospitalization.   89 year old female with history of nephrectomy, hypertension comes in with chief complaint of dizziness.  Collateral history provided by EMS. Patient also found to be hypertensive and complaints of palpitations.  Differential diagnosis for this patient includes stroke, peripheral vertigo, orthostatic hypotension, symptomatic arrhythmia -including A-fib, PAC, pulmonary embolism, severe anemia, electrolyte abnormality.  Initial evaluation is overall reassuring. Patient does not have any focal neurodeficits, no dysmetria, no nystagmus.  Upon attempted ambulation, patient does require increased assistance.  It still appears to me that most likely patient has peripheral vertigo.  She denies any ear symptoms associated with her dizziness.  Plan is to get basic labs, place patient on cardiac monitoring and initiate cardiac and neurologic workup.  Of note, patient's initial EKG is concerning for A-fib.  However, in leads V3, V5 I clearly see P waves following the T waves.  Patient does have irregularly irregular heart rhythm.  Questioning if patient might have new onset arrhythmia, although cannot confirm A-fib right now, A-fib is a  possibility.  Other possibilities maybe she had cryptogenic stroke leading to the dizziness.  Reassessment: Patient's initial workup is overall reassuring. I independently interpreted CT scan of the brain, no evidence of brain bleed. On telemetry, patient has had irregular heart rhythm.  I did discuss the case with cardiology service and they also feel comfortable stating that EKG  on file does not show A-fib.  On reassessment, patient is still having some unsteadiness with ambulation.  She is 31, lives by herself, she is not feeling comfortable right now.  Her BP is also high.  Given the ambiguity of her diagnosis, where stroke is in the differential, I do not want to reduce the BP aggressively.  I also feel uncomfortable discharging patient with dizziness, high risk for fall when she lives by herself and is not comfortable going home right now.  I think it will be best to admit the patient.  Place patient on telemetry to see if there is new onset A-fib, focus on slow BP correction and if needed ruling out stroke via MRI.  Admitting team is comfortable with this plan.  Patient appreciates the plan.  Final Clinical Impression(s) / ED Diagnoses Final diagnoses:  Dizziness    Rx / DC Orders ED Discharge Orders          Ordered    Increase activity slowly        08/08/23 1327    Diet - low sodium heart healthy        08/08/23 1327    Call MD for:  temperature >100.4        08/08/23 1327    Call MD for:  extreme fatigue        08/08/23 1327    Call MD for:  persistant dizziness or light-headedness        08/08/23 1327    metoprolol tartrate (LOPRESSOR) 25 MG tablet  2 times daily,   Status:  Discontinued        08/08/23 1327    cefdinir (OMNICEF) 300 MG capsule  2 times daily,   Status:  Discontinued        08/08/23 1327    cefdinir (OMNICEF) 300 MG capsule  2 times daily        08/08/23 1338    metoprolol tartrate (LOPRESSOR) 25 MG tablet  2 times daily        08/08/23 1338    aspirin  EC 81 MG tablet  Daily        08/08/23 1338    Amb referral to AFIB Clinic        08/06/23 1307              Derwood Kaplan, MD 08/15/23 [phone]

## 2023-08-19 DIAGNOSIS — M87851 Other osteonecrosis, right femur: Secondary | ICD-10-CM | POA: Diagnosis not present

## 2023-08-19 DIAGNOSIS — E872 Acidosis, unspecified: Secondary | ICD-10-CM | POA: Diagnosis not present

## 2023-08-19 DIAGNOSIS — E1122 Type 2 diabetes mellitus with diabetic chronic kidney disease: Secondary | ICD-10-CM | POA: Diagnosis not present

## 2023-08-19 DIAGNOSIS — M5416 Radiculopathy, lumbar region: Secondary | ICD-10-CM | POA: Diagnosis not present

## 2023-08-19 DIAGNOSIS — M4802 Spinal stenosis, cervical region: Secondary | ICD-10-CM | POA: Diagnosis not present

## 2023-08-19 DIAGNOSIS — D6859 Other primary thrombophilia: Secondary | ICD-10-CM | POA: Diagnosis not present

## 2023-08-19 DIAGNOSIS — N3000 Acute cystitis without hematuria: Secondary | ICD-10-CM | POA: Diagnosis not present

## 2023-08-19 DIAGNOSIS — E1165 Type 2 diabetes mellitus with hyperglycemia: Secondary | ICD-10-CM | POA: Diagnosis not present

## 2023-08-19 DIAGNOSIS — D631 Anemia in chronic kidney disease: Secondary | ICD-10-CM | POA: Diagnosis not present

## 2023-08-19 DIAGNOSIS — I129 Hypertensive chronic kidney disease with stage 1 through stage 4 chronic kidney disease, or unspecified chronic kidney disease: Secondary | ICD-10-CM | POA: Diagnosis not present

## 2023-08-19 DIAGNOSIS — E441 Mild protein-calorie malnutrition: Secondary | ICD-10-CM | POA: Diagnosis not present

## 2023-08-19 DIAGNOSIS — J449 Chronic obstructive pulmonary disease, unspecified: Secondary | ICD-10-CM | POA: Diagnosis not present

## 2023-08-19 DIAGNOSIS — L309 Dermatitis, unspecified: Secondary | ICD-10-CM | POA: Diagnosis not present

## 2023-08-19 DIAGNOSIS — D6489 Other specified anemias: Secondary | ICD-10-CM | POA: Diagnosis not present

## 2023-08-19 DIAGNOSIS — I479 Paroxysmal tachycardia, unspecified: Secondary | ICD-10-CM | POA: Diagnosis not present

## 2023-08-19 DIAGNOSIS — F0393 Unspecified dementia, unspecified severity, with mood disturbance: Secondary | ICD-10-CM | POA: Diagnosis not present

## 2023-08-19 DIAGNOSIS — M1990 Unspecified osteoarthritis, unspecified site: Secondary | ICD-10-CM | POA: Diagnosis not present

## 2023-08-19 DIAGNOSIS — M545 Low back pain, unspecified: Secondary | ICD-10-CM | POA: Diagnosis not present

## 2023-08-19 DIAGNOSIS — G8929 Other chronic pain: Secondary | ICD-10-CM | POA: Diagnosis not present

## 2023-08-19 DIAGNOSIS — E611 Iron deficiency: Secondary | ICD-10-CM | POA: Diagnosis not present

## 2023-08-19 DIAGNOSIS — F0394 Unspecified dementia, unspecified severity, with anxiety: Secondary | ICD-10-CM | POA: Diagnosis not present

## 2023-08-19 DIAGNOSIS — M810 Age-related osteoporosis without current pathological fracture: Secondary | ICD-10-CM | POA: Diagnosis not present

## 2023-08-19 DIAGNOSIS — N1831 Chronic kidney disease, stage 3a: Secondary | ICD-10-CM | POA: Diagnosis not present

## 2023-08-19 DIAGNOSIS — E785 Hyperlipidemia, unspecified: Secondary | ICD-10-CM | POA: Diagnosis not present

## 2023-08-20 ENCOUNTER — Ambulatory Visit (HOSPITAL_COMMUNITY): Payer: Medicare HMO | Attending: Cardiology

## 2023-08-20 DIAGNOSIS — Q2543 Congenital aneurysm of aorta: Secondary | ICD-10-CM | POA: Diagnosis not present

## 2023-08-20 DIAGNOSIS — E119 Type 2 diabetes mellitus without complications: Secondary | ICD-10-CM | POA: Diagnosis not present

## 2023-08-20 DIAGNOSIS — I082 Rheumatic disorders of both aortic and tricuspid valves: Secondary | ICD-10-CM | POA: Insufficient documentation

## 2023-08-20 DIAGNOSIS — R7989 Other specified abnormal findings of blood chemistry: Secondary | ICD-10-CM | POA: Diagnosis not present

## 2023-08-20 DIAGNOSIS — R002 Palpitations: Secondary | ICD-10-CM | POA: Diagnosis not present

## 2023-08-20 DIAGNOSIS — E785 Hyperlipidemia, unspecified: Secondary | ICD-10-CM | POA: Insufficient documentation

## 2023-08-20 DIAGNOSIS — I10 Essential (primary) hypertension: Secondary | ICD-10-CM | POA: Insufficient documentation

## 2023-08-20 LAB — ECHOCARDIOGRAM COMPLETE
Area-P 1/2: 2.24 cm2
S' Lateral: 2.49 cm

## 2023-08-25 ENCOUNTER — Telehealth: Payer: Self-pay

## 2023-08-25 NOTE — Telephone Encounter (Signed)
-----   Message from Rollene Rotunda sent at 08/24/2023 11:48 AM EST ----- No sustained arrhythmias.  There were rare rapid rates but they were short lived and I would not suggest change in management for this.  Call Ms. Graumann with the results and send results to Charlane Ferretti, DO

## 2023-08-25 NOTE — Telephone Encounter (Signed)
Alexa Rotunda, MD  Alexa Prairie, RN No sustained arrhythmias.  There were rare rapid rates but they were short lived and I would not suggest change in management for this.  Call Ms. Stthomas with the results and send results to Charlane Ferretti, DO  Called and spoke with patient and gave her verbal results. I also informed her that I would send them in a letter as requested.

## 2023-09-02 NOTE — Progress Notes (Deleted)
  Cardiology Office Note   Date:  09/02/2023  ID:  Alexa [name], DOB 1929-10-22, MRN [id number] PCP:  Valentin Skates, DO [facility] Cardiologist: Lynwood Schilling, MD  Reason for visit: Hospital follow-up  History of Present Illness    Alexa [name] is a 89 y.o. female who was most recently seen for possible arrhythmia at her PCP office.  Patient ambulates with rolling walker.  EKG showed ectopy.  3-day monitor ordered.  Blood pressure was slightly elevated.  Secondary to her age and frailty, medications deferred.  Monitor showed normal sinus rhythm with occasional runs of SVT with longest run 9 beats, occasional isolated premature ectopic beats, no sustained arrhythmias.  No change in management recommended.  2D echo January 2025 showed EF 50-55%, grade 1 diastolic dysfunction, normal RV mild AI, aneurysm of the aortic root measuring 49 mm -medical management recommended.  She was admitted to the hospital January 8 to August 08, 2023 with dizziness.  She complained of unsteady gait and difficulty ambulating.  Felt patient had UTI and treated with antibiotics.  There was question if she was going to A-fib during her hospitalization.  It was thought patient was having sinus tachycardia with frequent PACs.  Patient was started on metoprolol  tartrate 12.5 mg twice daily.  Today, ***  Sinus tachycardia Premature atrial contractions -Noted to have sinus tach with freq PACs during 07/2023 hospitalization, started on metoprolol  tartrate 12.5 mg twice daily -***  Aneurysm of the aortic root -Echo 07/2023: measuring 49 mm -medical management recommended  Hypertension -*** -Goal BP is <130/80.  Recommend DASH diet (high in vegetables, fruits, low-fat dairy products, whole grains, poultry, fish, and nuts and low in sweets, sugar-sweetened beverages, and red meats), salt restriction and increase physical activity.  Disposition - Follow-up in ***       Objective / Physical Exam   EKG  today: ***  Vital signs:  LMP  (LMP Unknown)     GEN: No acute distress NECK: No carotid bruits CARDIAC: ***RRR, no murmurs RESPIRATORY:  Clear to auscultation without rales, wheezing or rhonchi  EXTREMITIES: No edema  Assessment and Plan   ***   {Are you ordering a CV Procedure (e.g. stress test, cath, DCCV, TEE, etc)?   Press F2        :[id number]}    Signed, Delon MARLA Cindie DEVONNA  09/02/2023 [facility]

## 2023-09-03 ENCOUNTER — Ambulatory Visit: Payer: PPO | Attending: Physician Assistant | Admitting: Physician Assistant

## 2023-09-03 DIAGNOSIS — I10 Essential (primary) hypertension: Secondary | ICD-10-CM

## 2023-09-03 DIAGNOSIS — I491 Atrial premature depolarization: Secondary | ICD-10-CM

## 2023-09-09 ENCOUNTER — Other Ambulatory Visit (HOSPITAL_COMMUNITY): Payer: Self-pay

## 2023-09-09 ENCOUNTER — Other Ambulatory Visit: Payer: Self-pay

## 2023-09-10 ENCOUNTER — Other Ambulatory Visit: Payer: Self-pay

## 2023-09-10 ENCOUNTER — Other Ambulatory Visit (HOSPITAL_COMMUNITY): Payer: Self-pay

## 2023-09-11 ENCOUNTER — Other Ambulatory Visit (HOSPITAL_COMMUNITY): Payer: Self-pay

## 2023-09-11 MED ORDER — ACETAMINOPHEN ER 650 MG PO TBCR: 650.0000 mg | EXTENDED_RELEASE_TABLET | Freq: Four times a day (QID) | ORAL | 3 refills | Status: DC | PRN

## 2023-09-11 MED ORDER — AMLODIPINE BESYLATE 5 MG PO TABS
5.0000 mg | ORAL_TABLET | Freq: Every day | ORAL | 1 refills | Status: DC
Start: 2023-07-24 — End: 2024-03-09

## 2023-09-11 MED ORDER — METOPROLOL TARTRATE 25 MG PO TABS: 12.5000 mg | ORAL_TABLET | Freq: Two times a day (BID) | ORAL | 1 refills | Status: DC

## 2023-09-11 MED ORDER — LINACLOTIDE 290 MCG PO CAPS: 290.0000 ug | ORAL_CAPSULE | Freq: Every morning | ORAL | 3 refills | Status: DC

## 2023-09-11 MED ORDER — AMLODIPINE BESYLATE 5 MG PO TABS
5.0000 mg | ORAL_TABLET | Freq: Every day | ORAL | 3 refills | Status: AC
  Filled 2024-05-10: qty 90, 90d supply, fill #0

## 2023-09-11 MED ORDER — FERROUS SULFATE 325 (65 FE) MG PO TABS: 325.0000 mg | ORAL_TABLET | Freq: Every day | ORAL | 0 refills | Status: DC

## 2023-09-11 MED ORDER — METFORMIN HCL 500 MG PO TABS: 500.0000 mg | ORAL_TABLET | Freq: Every day | ORAL | 0 refills | Status: DC

## 2023-09-11 MED ORDER — IBUPROFEN 600 MG PO TABS
600.0000 mg | ORAL_TABLET | Freq: Four times a day (QID) | ORAL | 0 refills | Status: DC | PRN
Start: 2023-08-25 — End: 2024-03-09

## 2023-09-11 MED ORDER — ACCU-CHEK SOFTCLIX LANCETS MISC: 3 refills | Status: DC

## 2023-09-11 MED ORDER — FLUTICASONE PROPIONATE 50 MCG/ACT NA SUSP
1.0000 | Freq: Every day | NASAL | 0 refills | Status: AC
  Filled 2023-09-11: qty 16, 30d supply, fill #0

## 2023-09-11 MED ORDER — LINACLOTIDE 290 MCG PO CAPS
290.0000 ug | ORAL_CAPSULE | Freq: Every morning | ORAL | 3 refills | Status: DC
  Filled ????-??-??: fill #0

## 2023-09-11 MED ORDER — HYDROXYZINE HCL 10 MG PO TABS
10.0000 mg | ORAL_TABLET | Freq: Every day | ORAL | 0 refills | Status: DC | PRN
  Filled 2023-09-25: qty 15, 15d supply, fill #0

## 2023-09-11 MED ORDER — ESTRADIOL 0.1 MG/GM VA CREA: TOPICAL_CREAM | VAGINAL | 3 refills | Status: DC

## 2023-09-11 MED ORDER — ESTRADIOL 0.1 MG/GM VA CREA
TOPICAL_CREAM | VAGINAL | 3 refills | Status: DC
  Filled 2023-09-11: qty 42.5, 30d supply, fill #0

## 2023-09-11 MED ORDER — ALBUTEROL SULFATE HFA 108 (90 BASE) MCG/ACT IN AERS
1.0000 | INHALATION_SPRAY | RESPIRATORY_TRACT | 0 refills | Status: AC | PRN
  Filled 2024-05-10: qty 6.7, 30d supply, fill #0

## 2023-09-11 MED ORDER — GLUCOSE BLOOD VI STRP: ORAL_STRIP | Freq: Every day | 1 refills | Status: DC

## 2023-09-11 MED ORDER — ACCU-CHEK GUIDE W/DEVICE KIT: PACK | 0 refills | Status: DC

## 2023-09-11 MED ORDER — HYDROXYZINE HCL 10 MG PO TABS
10.0000 mg | ORAL_TABLET | Freq: Every day | ORAL | 3 refills | Status: AC | PRN
  Filled 2023-09-25: qty 30, 30d supply, fill #0

## 2023-09-12 ENCOUNTER — Other Ambulatory Visit (HOSPITAL_COMMUNITY): Payer: Self-pay

## 2023-09-12 ENCOUNTER — Other Ambulatory Visit: Payer: Self-pay

## 2023-09-12 MED ORDER — AMLODIPINE BESYLATE 5 MG PO TABS
5.0000 mg | ORAL_TABLET | Freq: Every day | ORAL | 1 refills | Status: DC
Start: 2022-11-11 — End: 2024-03-09
  Filled 2023-09-12 (×2): qty 90, 90d supply, fill #0

## 2023-09-16 ENCOUNTER — Other Ambulatory Visit (HOSPITAL_COMMUNITY): Payer: Self-pay

## 2023-09-16 DIAGNOSIS — I129 Hypertensive chronic kidney disease with stage 1 through stage 4 chronic kidney disease, or unspecified chronic kidney disease: Secondary | ICD-10-CM | POA: Diagnosis not present

## 2023-09-16 DIAGNOSIS — E1122 Type 2 diabetes mellitus with diabetic chronic kidney disease: Secondary | ICD-10-CM | POA: Diagnosis not present

## 2023-09-16 DIAGNOSIS — M810 Age-related osteoporosis without current pathological fracture: Secondary | ICD-10-CM | POA: Diagnosis not present

## 2023-09-16 DIAGNOSIS — M1990 Unspecified osteoarthritis, unspecified site: Secondary | ICD-10-CM | POA: Diagnosis not present

## 2023-09-16 DIAGNOSIS — G8929 Other chronic pain: Secondary | ICD-10-CM | POA: Diagnosis not present

## 2023-09-16 DIAGNOSIS — M87851 Other osteonecrosis, right femur: Secondary | ICD-10-CM | POA: Diagnosis not present

## 2023-09-16 DIAGNOSIS — N3000 Acute cystitis without hematuria: Secondary | ICD-10-CM | POA: Diagnosis not present

## 2023-09-16 DIAGNOSIS — N1831 Chronic kidney disease, stage 3a: Secondary | ICD-10-CM | POA: Diagnosis not present

## 2023-09-16 DIAGNOSIS — F0394 Unspecified dementia, unspecified severity, with anxiety: Secondary | ICD-10-CM | POA: Diagnosis not present

## 2023-09-16 DIAGNOSIS — M5416 Radiculopathy, lumbar region: Secondary | ICD-10-CM | POA: Diagnosis not present

## 2023-09-16 DIAGNOSIS — I479 Paroxysmal tachycardia, unspecified: Secondary | ICD-10-CM | POA: Diagnosis not present

## 2023-09-16 DIAGNOSIS — M4802 Spinal stenosis, cervical region: Secondary | ICD-10-CM | POA: Diagnosis not present

## 2023-09-25 ENCOUNTER — Other Ambulatory Visit: Payer: Self-pay

## 2023-09-25 ENCOUNTER — Other Ambulatory Visit (HOSPITAL_COMMUNITY): Payer: Self-pay

## 2023-09-29 ENCOUNTER — Other Ambulatory Visit (HOSPITAL_COMMUNITY): Payer: Self-pay

## 2023-10-03 ENCOUNTER — Other Ambulatory Visit (HOSPITAL_COMMUNITY): Payer: Self-pay

## 2023-10-03 MED ORDER — HYDROCODONE-ACETAMINOPHEN 5-325 MG PO TABS
1.0000 | ORAL_TABLET | Freq: Two times a day (BID) | ORAL | 0 refills | Status: DC | PRN
Start: 2023-10-03 — End: 2023-10-10
  Filled 2023-10-03: qty 60, 30d supply, fill #0
  Filled 2023-10-03: qty 14, 7d supply, fill #0
  Filled 2023-10-03 (×2): qty 60, 30d supply, fill #0
  Filled 2023-10-10: qty 46, 23d supply, fill #1

## 2023-10-06 ENCOUNTER — Other Ambulatory Visit: Payer: Self-pay

## 2023-10-06 ENCOUNTER — Other Ambulatory Visit (HOSPITAL_COMMUNITY): Payer: Self-pay

## 2023-10-10 ENCOUNTER — Other Ambulatory Visit (HOSPITAL_COMMUNITY): Payer: Self-pay

## 2023-10-10 ENCOUNTER — Other Ambulatory Visit: Payer: Self-pay

## 2023-10-15 DIAGNOSIS — L602 Onychogryphosis: Secondary | ICD-10-CM | POA: Diagnosis not present

## 2023-10-15 DIAGNOSIS — M549 Dorsalgia, unspecified: Secondary | ICD-10-CM | POA: Diagnosis not present

## 2023-10-15 DIAGNOSIS — M25561 Pain in right knee: Secondary | ICD-10-CM | POA: Diagnosis not present

## 2023-10-15 DIAGNOSIS — R2681 Unsteadiness on feet: Secondary | ICD-10-CM | POA: Diagnosis not present

## 2023-10-22 ENCOUNTER — Ambulatory Visit: Admitting: Podiatry

## 2023-10-22 ENCOUNTER — Ambulatory Visit (INDEPENDENT_AMBULATORY_CARE_PROVIDER_SITE_OTHER)

## 2023-10-22 ENCOUNTER — Encounter: Payer: Self-pay | Admitting: Podiatry

## 2023-10-22 DIAGNOSIS — M79675 Pain in left toe(s): Secondary | ICD-10-CM

## 2023-10-22 DIAGNOSIS — M79674 Pain in right toe(s): Secondary | ICD-10-CM | POA: Diagnosis not present

## 2023-10-22 DIAGNOSIS — L84 Corns and callosities: Secondary | ICD-10-CM

## 2023-10-22 DIAGNOSIS — M2141 Flat foot [pes planus] (acquired), right foot: Secondary | ICD-10-CM

## 2023-10-22 DIAGNOSIS — E1140 Type 2 diabetes mellitus with diabetic neuropathy, unspecified: Secondary | ICD-10-CM | POA: Diagnosis not present

## 2023-10-22 DIAGNOSIS — B351 Tinea unguium: Secondary | ICD-10-CM | POA: Diagnosis not present

## 2023-10-22 DIAGNOSIS — M2142 Flat foot [pes planus] (acquired), left foot: Secondary | ICD-10-CM

## 2023-10-22 DIAGNOSIS — E1149 Type 2 diabetes mellitus with other diabetic neurological complication: Secondary | ICD-10-CM | POA: Diagnosis not present

## 2023-10-23 NOTE — Progress Notes (Signed)
 Subjective:   Patient ID: Alexa [name], female   DOB: 89 y.o.   MRN: [id number]   HPI Patient presents with caregiver with significant flatfeet deformity and ankle pain and nail disease 1-5 both feet that she cannot take care of.  She has had problems for a while and has gotten worse over the last 6 months and she is using a walker and has had history of stroke 17 years ago.  Patient does not smoke.  She is not diabetic with moderate tingling and burning at times   Review of Systems  All other systems reviewed and are negative.       Objective:  Physical Exam Vitals and nursing note reviewed.  Constitutional:      Appearance: She is well-developed.  Pulmonary:     Effort: Pulmonary effort is normal.  Musculoskeletal:        General: Normal range of motion.  Skin:    General: Skin is warm.  Neurological:     Mental Status: She is alert.     Neurovascular status intact muscle strength was found to be diminished with severe loss of motion subtalar joint right and left foot.  Patient is found to have moderate cavus foot structure and forefoot instability equinus condition and elongated nailbeds 1-5 both feet that are thick and painful.  Patient is found to have lesion third digit right and underneath the left foot with pain and is found to have diminishment of sharp dull vibratory     Assessment:  Patient who is in a difficult situation with mycotic nail infection and lesion formations with foot structural issues.  Also diabetes with moderate neuropathic symptomatology     Plan:  H&P reviewed at this point I did discuss different insert therapy and she wants custom and she was evaluated by pedorthist did casting for her.  I then went ahead I debrided nailbeds 1-5 both feet no iatrogenic bleeding  debridement of several lesions with no iatrogenic bleeding and she will be seen back when ready

## 2023-10-30 ENCOUNTER — Other Ambulatory Visit (HOSPITAL_COMMUNITY): Payer: Self-pay

## 2023-12-05 ENCOUNTER — Ambulatory Visit

## 2023-12-09 ENCOUNTER — Ambulatory Visit: Admitting: Podiatry

## 2023-12-10 ENCOUNTER — Other Ambulatory Visit

## 2024-01-21 DIAGNOSIS — F419 Anxiety disorder, unspecified: Secondary | ICD-10-CM | POA: Diagnosis not present

## 2024-01-21 DIAGNOSIS — E611 Iron deficiency: Secondary | ICD-10-CM | POA: Diagnosis not present

## 2024-01-21 DIAGNOSIS — M1990 Unspecified osteoarthritis, unspecified site: Secondary | ICD-10-CM | POA: Diagnosis not present

## 2024-01-21 DIAGNOSIS — F0390 Unspecified dementia without behavioral disturbance: Secondary | ICD-10-CM | POA: Diagnosis not present

## 2024-01-21 DIAGNOSIS — D6859 Other primary thrombophilia: Secondary | ICD-10-CM | POA: Diagnosis not present

## 2024-01-21 DIAGNOSIS — Z91018 Allergy to other foods: Secondary | ICD-10-CM | POA: Diagnosis not present

## 2024-01-21 DIAGNOSIS — I479 Paroxysmal tachycardia, unspecified: Secondary | ICD-10-CM | POA: Diagnosis not present

## 2024-01-21 DIAGNOSIS — N1831 Chronic kidney disease, stage 3a: Secondary | ICD-10-CM | POA: Diagnosis not present

## 2024-01-21 DIAGNOSIS — E1122 Type 2 diabetes mellitus with diabetic chronic kidney disease: Secondary | ICD-10-CM | POA: Diagnosis not present

## 2024-01-21 DIAGNOSIS — M810 Age-related osteoporosis without current pathological fracture: Secondary | ICD-10-CM | POA: Diagnosis not present

## 2024-01-21 DIAGNOSIS — I10 Essential (primary) hypertension: Secondary | ICD-10-CM | POA: Diagnosis not present

## 2024-01-21 DIAGNOSIS — M545 Low back pain, unspecified: Secondary | ICD-10-CM | POA: Diagnosis not present

## 2024-01-22 NOTE — Progress Notes (Signed)
 Patient presents today to pick up custom molded foot orthotics, diagnosed with Pes planus by Dr. Magdalen.   Orthotics were dispensed and fit was satisfactory. Reviewed instructions for break-in and wear. Written instructions given to patient.  Patient will follow up as needed.   Lolita

## 2024-01-26 DIAGNOSIS — M5416 Radiculopathy, lumbar region: Secondary | ICD-10-CM | POA: Diagnosis not present

## 2024-01-26 DIAGNOSIS — R296 Repeated falls: Secondary | ICD-10-CM | POA: Diagnosis not present

## 2024-01-26 DIAGNOSIS — R32 Unspecified urinary incontinence: Secondary | ICD-10-CM | POA: Diagnosis not present

## 2024-01-26 DIAGNOSIS — M810 Age-related osteoporosis without current pathological fracture: Secondary | ICD-10-CM | POA: Diagnosis not present

## 2024-01-26 DIAGNOSIS — J4489 Other specified chronic obstructive pulmonary disease: Secondary | ICD-10-CM | POA: Diagnosis not present

## 2024-01-26 DIAGNOSIS — N1831 Chronic kidney disease, stage 3a: Secondary | ICD-10-CM | POA: Diagnosis not present

## 2024-01-26 DIAGNOSIS — I10 Essential (primary) hypertension: Secondary | ICD-10-CM | POA: Diagnosis not present

## 2024-01-26 DIAGNOSIS — M6289 Other specified disorders of muscle: Secondary | ICD-10-CM | POA: Diagnosis not present

## 2024-01-26 DIAGNOSIS — G8929 Other chronic pain: Secondary | ICD-10-CM | POA: Diagnosis not present

## 2024-01-26 DIAGNOSIS — M1990 Unspecified osteoarthritis, unspecified site: Secondary | ICD-10-CM | POA: Diagnosis not present

## 2024-01-26 DIAGNOSIS — E1122 Type 2 diabetes mellitus with diabetic chronic kidney disease: Secondary | ICD-10-CM | POA: Diagnosis not present

## 2024-01-26 DIAGNOSIS — K219 Gastro-esophageal reflux disease without esophagitis: Secondary | ICD-10-CM | POA: Diagnosis not present

## 2024-01-26 DIAGNOSIS — F0393 Unspecified dementia, unspecified severity, with mood disturbance: Secondary | ICD-10-CM | POA: Diagnosis not present

## 2024-01-26 DIAGNOSIS — F32A Depression, unspecified: Secondary | ICD-10-CM | POA: Diagnosis not present

## 2024-01-26 DIAGNOSIS — I129 Hypertensive chronic kidney disease with stage 1 through stage 4 chronic kidney disease, or unspecified chronic kidney disease: Secondary | ICD-10-CM | POA: Diagnosis not present

## 2024-01-26 DIAGNOSIS — M879 Osteonecrosis, unspecified: Secondary | ICD-10-CM | POA: Diagnosis not present

## 2024-01-26 DIAGNOSIS — I479 Paroxysmal tachycardia, unspecified: Secondary | ICD-10-CM | POA: Diagnosis not present

## 2024-01-26 DIAGNOSIS — F0394 Unspecified dementia, unspecified severity, with anxiety: Secondary | ICD-10-CM | POA: Diagnosis not present

## 2024-01-26 DIAGNOSIS — Z7982 Long term (current) use of aspirin: Secondary | ICD-10-CM | POA: Diagnosis not present

## 2024-01-26 DIAGNOSIS — F419 Anxiety disorder, unspecified: Secondary | ICD-10-CM | POA: Diagnosis not present

## 2024-01-26 DIAGNOSIS — D6859 Other primary thrombophilia: Secondary | ICD-10-CM | POA: Diagnosis not present

## 2024-01-26 DIAGNOSIS — K589 Irritable bowel syndrome without diarrhea: Secondary | ICD-10-CM | POA: Diagnosis not present

## 2024-01-26 DIAGNOSIS — E611 Iron deficiency: Secondary | ICD-10-CM | POA: Diagnosis not present

## 2024-01-26 DIAGNOSIS — E785 Hyperlipidemia, unspecified: Secondary | ICD-10-CM | POA: Diagnosis not present

## 2024-01-26 DIAGNOSIS — M4802 Spinal stenosis, cervical region: Secondary | ICD-10-CM | POA: Diagnosis not present

## 2024-01-26 DIAGNOSIS — K5900 Constipation, unspecified: Secondary | ICD-10-CM | POA: Diagnosis not present

## 2024-01-26 DIAGNOSIS — G6289 Other specified polyneuropathies: Secondary | ICD-10-CM | POA: Diagnosis not present

## 2024-02-09 DIAGNOSIS — Z7409 Other reduced mobility: Secondary | ICD-10-CM | POA: Diagnosis not present

## 2024-02-09 DIAGNOSIS — R2681 Unsteadiness on feet: Secondary | ICD-10-CM | POA: Diagnosis not present

## 2024-02-19 ENCOUNTER — Ambulatory Visit: Admitting: Podiatry

## 2024-02-19 ENCOUNTER — Encounter: Payer: Self-pay | Admitting: Podiatry

## 2024-02-19 DIAGNOSIS — B351 Tinea unguium: Secondary | ICD-10-CM

## 2024-02-19 DIAGNOSIS — M2142 Flat foot [pes planus] (acquired), left foot: Secondary | ICD-10-CM

## 2024-02-19 DIAGNOSIS — M2141 Flat foot [pes planus] (acquired), right foot: Secondary | ICD-10-CM | POA: Diagnosis not present

## 2024-02-19 DIAGNOSIS — M79676 Pain in unspecified toe(s): Secondary | ICD-10-CM | POA: Diagnosis not present

## 2024-02-19 NOTE — Progress Notes (Signed)
 Subjective:   Patient ID: Alexa [name], female   DOB: 89 y.o.   MRN: [id number]   HPI Patient presents with caregiver with orthotics which are doing well but wondering whether or not a brace would be of benefit for her foot deformity and leg deformity right with very thin tissue on her right lower leg.  Does have flatfoot also concerned about nail disease with thickness 1-5 both feet she cannot take care of   ROS      Objective:  Physical Exam  Neurovascular status unchanged with patient found to have collapsing of the medial longitudinal arch right moderate posterior tibial tendinitis history of orthotics which have been beneficial and thick yellow brittle nailbeds 1-5 both feet painful     Assessment:  Moderate collapse medial longitudinal arch this consistent with foot deformity with also mycotic nail infection 1-5 both feet     Plan:  Reviewed both conditions and at this point I debrided nailbeds 1-5 both feet no iatrogenic bleeding I discussed the leg deformity and I have recommended that we not do a brace but do shoe gear modifications as she seems to be doing very well overall with the orthotics.  Bracing I am worried would cause tearing of her tissue and I do not want that to happen

## 2024-02-23 DIAGNOSIS — R519 Headache, unspecified: Secondary | ICD-10-CM | POA: Diagnosis not present

## 2024-02-23 DIAGNOSIS — H9202 Otalgia, left ear: Secondary | ICD-10-CM | POA: Diagnosis not present

## 2024-02-24 DIAGNOSIS — H6992 Unspecified Eustachian tube disorder, left ear: Secondary | ICD-10-CM | POA: Diagnosis not present

## 2024-02-24 DIAGNOSIS — H9202 Otalgia, left ear: Secondary | ICD-10-CM | POA: Diagnosis not present

## 2024-03-08 ENCOUNTER — Emergency Department (HOSPITAL_COMMUNITY)

## 2024-03-08 ENCOUNTER — Other Ambulatory Visit: Payer: Self-pay

## 2024-03-08 ENCOUNTER — Inpatient Hospital Stay (HOSPITAL_COMMUNITY)
Admission: EM | Admit: 2024-03-08 | Discharge: 2024-03-11 | DRG: 392 | Disposition: A | Discharge status: Home Health | Attending: Internal Medicine | Admitting: Internal Medicine

## 2024-03-08 ENCOUNTER — Encounter (HOSPITAL_COMMUNITY): Payer: Self-pay

## 2024-03-08 DIAGNOSIS — R0789 Other chest pain: Secondary | ICD-10-CM | POA: Diagnosis present

## 2024-03-08 DIAGNOSIS — Z801 Family history of malignant neoplasm of trachea, bronchus and lung: Secondary | ICD-10-CM

## 2024-03-08 DIAGNOSIS — Z8249 Family history of ischemic heart disease and other diseases of the circulatory system: Secondary | ICD-10-CM

## 2024-03-08 DIAGNOSIS — Z79899 Other long term (current) drug therapy: Secondary | ICD-10-CM

## 2024-03-08 DIAGNOSIS — R1310 Dysphagia, unspecified: Secondary | ICD-10-CM

## 2024-03-08 DIAGNOSIS — Z604 Social exclusion and rejection: Secondary | ICD-10-CM | POA: Diagnosis present

## 2024-03-08 DIAGNOSIS — R059 Cough, unspecified: Secondary | ICD-10-CM | POA: Diagnosis not present

## 2024-03-08 DIAGNOSIS — Z86718 Personal history of other venous thrombosis and embolism: Secondary | ICD-10-CM

## 2024-03-08 DIAGNOSIS — K2950 Unspecified chronic gastritis without bleeding: Secondary | ICD-10-CM | POA: Diagnosis not present

## 2024-03-08 DIAGNOSIS — J449 Chronic obstructive pulmonary disease, unspecified: Secondary | ICD-10-CM | POA: Diagnosis not present

## 2024-03-08 DIAGNOSIS — Z833 Family history of diabetes mellitus: Secondary | ICD-10-CM

## 2024-03-08 DIAGNOSIS — K581 Irritable bowel syndrome with constipation: Secondary | ICD-10-CM | POA: Diagnosis present

## 2024-03-08 DIAGNOSIS — K209 Esophagitis, unspecified without bleeding: Secondary | ICD-10-CM | POA: Diagnosis not present

## 2024-03-08 DIAGNOSIS — K219 Gastro-esophageal reflux disease without esophagitis: Secondary | ICD-10-CM | POA: Diagnosis present

## 2024-03-08 DIAGNOSIS — K222 Esophageal obstruction: Secondary | ICD-10-CM | POA: Diagnosis not present

## 2024-03-08 DIAGNOSIS — K5730 Diverticulosis of large intestine without perforation or abscess without bleeding: Secondary | ICD-10-CM | POA: Diagnosis not present

## 2024-03-08 DIAGNOSIS — B9681 Helicobacter pylori [H. pylori] as the cause of diseases classified elsewhere: Secondary | ICD-10-CM | POA: Diagnosis not present

## 2024-03-08 DIAGNOSIS — Z860102 Personal history of hyperplastic colon polyps: Secondary | ICD-10-CM

## 2024-03-08 DIAGNOSIS — F419 Anxiety disorder, unspecified: Secondary | ICD-10-CM | POA: Diagnosis present

## 2024-03-08 DIAGNOSIS — Z905 Acquired absence of kidney: Secondary | ICD-10-CM

## 2024-03-08 DIAGNOSIS — Z86711 Personal history of pulmonary embolism: Secondary | ICD-10-CM

## 2024-03-08 DIAGNOSIS — K2289 Other specified disease of esophagus: Secondary | ICD-10-CM | POA: Diagnosis not present

## 2024-03-08 DIAGNOSIS — Z7984 Long term (current) use of oral hypoglycemic drugs: Secondary | ICD-10-CM

## 2024-03-08 DIAGNOSIS — E785 Hyperlipidemia, unspecified: Secondary | ICD-10-CM | POA: Diagnosis present

## 2024-03-08 DIAGNOSIS — Z7982 Long term (current) use of aspirin: Secondary | ICD-10-CM

## 2024-03-08 DIAGNOSIS — Z85528 Personal history of other malignant neoplasm of kidney: Secondary | ICD-10-CM

## 2024-03-08 DIAGNOSIS — Z825 Family history of asthma and other chronic lower respiratory diseases: Secondary | ICD-10-CM

## 2024-03-08 DIAGNOSIS — R933 Abnormal findings on diagnostic imaging of other parts of digestive tract: Secondary | ICD-10-CM | POA: Diagnosis not present

## 2024-03-08 DIAGNOSIS — Z8052 Family history of malignant neoplasm of bladder: Secondary | ICD-10-CM

## 2024-03-08 DIAGNOSIS — R110 Nausea: Secondary | ICD-10-CM | POA: Diagnosis not present

## 2024-03-08 DIAGNOSIS — K259 Gastric ulcer, unspecified as acute or chronic, without hemorrhage or perforation: Secondary | ICD-10-CM | POA: Diagnosis present

## 2024-03-08 DIAGNOSIS — K449 Diaphragmatic hernia without obstruction or gangrene: Secondary | ICD-10-CM | POA: Diagnosis not present

## 2024-03-08 DIAGNOSIS — I10 Essential (primary) hypertension: Secondary | ICD-10-CM | POA: Diagnosis present

## 2024-03-08 DIAGNOSIS — Z8049 Family history of malignant neoplasm of other genital organs: Secondary | ICD-10-CM

## 2024-03-08 DIAGNOSIS — E119 Type 2 diabetes mellitus without complications: Secondary | ICD-10-CM | POA: Diagnosis present

## 2024-03-08 DIAGNOSIS — Z818 Family history of other mental and behavioral disorders: Secondary | ICD-10-CM

## 2024-03-08 DIAGNOSIS — Z66 Do not resuscitate: Secondary | ICD-10-CM | POA: Diagnosis present

## 2024-03-08 DIAGNOSIS — K208 Other esophagitis without bleeding: Secondary | ICD-10-CM | POA: Diagnosis not present

## 2024-03-08 DIAGNOSIS — R509 Fever, unspecified: Secondary | ICD-10-CM | POA: Diagnosis not present

## 2024-03-08 DIAGNOSIS — Z823 Family history of stroke: Secondary | ICD-10-CM

## 2024-03-08 DIAGNOSIS — K3189 Other diseases of stomach and duodenum: Secondary | ICD-10-CM | POA: Diagnosis present

## 2024-03-08 DIAGNOSIS — Z832 Family history of diseases of the blood and blood-forming organs and certain disorders involving the immune mechanism: Secondary | ICD-10-CM

## 2024-03-08 DIAGNOSIS — Z96641 Presence of right artificial hip joint: Secondary | ICD-10-CM | POA: Diagnosis present

## 2024-03-08 DIAGNOSIS — R1111 Vomiting without nausea: Secondary | ICD-10-CM | POA: Diagnosis not present

## 2024-03-08 DIAGNOSIS — T50905A Adverse effect of unspecified drugs, medicaments and biological substances, initial encounter: Principal | ICD-10-CM

## 2024-03-08 DIAGNOSIS — Z885 Allergy status to narcotic agent status: Secondary | ICD-10-CM

## 2024-03-08 DIAGNOSIS — Z820 Family history of epilepsy and other diseases of the nervous system: Secondary | ICD-10-CM

## 2024-03-08 DIAGNOSIS — K220 Achalasia of cardia: Secondary | ICD-10-CM | POA: Diagnosis present

## 2024-03-08 DIAGNOSIS — Z79818 Long term (current) use of other agents affecting estrogen receptors and estrogen levels: Secondary | ICD-10-CM

## 2024-03-08 DIAGNOSIS — R1319 Other dysphagia: Secondary | ICD-10-CM | POA: Diagnosis not present

## 2024-03-08 DIAGNOSIS — Z888 Allergy status to other drugs, medicaments and biological substances status: Secondary | ICD-10-CM

## 2024-03-08 DIAGNOSIS — R079 Chest pain, unspecified: Secondary | ICD-10-CM | POA: Diagnosis not present

## 2024-03-08 DIAGNOSIS — R911 Solitary pulmonary nodule: Secondary | ICD-10-CM | POA: Diagnosis present

## 2024-03-08 DIAGNOSIS — J4489 Other specified chronic obstructive pulmonary disease: Secondary | ICD-10-CM | POA: Diagnosis present

## 2024-03-08 LAB — COMPREHENSIVE METABOLIC PANEL WITH GFR
ALT: 16 U/L (ref 0–44)
AST: 20 U/L (ref 15–41)
Albumin: 3.9 g/dL (ref 3.5–5.0)
Alkaline Phosphatase: 72 U/L (ref 38–126)
Anion gap: 14 (ref 5–15)
BUN: 28 mg/dL — ABNORMAL HIGH (ref 8–23)
CO2: 23 mmol/L (ref 22–32)
Calcium: 9.6 mg/dL (ref 8.9–10.3)
Chloride: 104 mmol/L (ref 98–111)
Creatinine, Ser: 0.88 mg/dL (ref 0.44–1.00)
GFR, Estimated: >60 mL/min (ref >60)
Glucose, Bld: 187 mg/dL — ABNORMAL HIGH (ref 70–99)
Potassium: 4.2 mmol/L (ref 3.5–5.1)
Sodium: 141 mmol/L (ref 135–145)
Total Bilirubin: 1.7 mg/dL — ABNORMAL HIGH (ref 0.0–1.2)
Total Protein: 7.5 g/dL (ref 6.5–8.1)

## 2024-03-08 LAB — CBC
HCT: 48.6 % — ABNORMAL HIGH (ref 36.0–46.0)
Hemoglobin: 15.9 g/dL — ABNORMAL HIGH (ref 12.0–15.0)
MCH: 30 pg (ref 26.0–34.0)
MCHC: 32.7 g/dL (ref 30.0–36.0)
MCV: 91.7 fL (ref 80.0–100.0)
Platelets: 304 K/uL (ref 150–400)
RBC: 5.3 MIL/uL — ABNORMAL HIGH (ref 3.87–5.11)
RDW: 13.4 % (ref 11.5–15.5)
WBC: 8.8 K/uL (ref 4.0–10.5)
nRBC: 0 % (ref 0.0–0.2)

## 2024-03-08 LAB — TROPONIN I (HIGH SENSITIVITY)
Troponin I (High Sensitivity): 14 ng/L (ref <18)
Troponin I (High Sensitivity): 16 ng/L (ref <18)

## 2024-03-08 LAB — LIPASE, BLOOD: Lipase: 56 U/L — ABNORMAL HIGH (ref 11–51)

## 2024-03-08 LAB — BRAIN NATRIURETIC PEPTIDE: B Natriuretic Peptide: 103.2 pg/mL — ABNORMAL HIGH (ref 0.0–100.0)

## 2024-03-08 MED ORDER — SODIUM CHLORIDE 0.9 % IV BOLUS
1000.0000 mL | Freq: Once | INTRAVENOUS | Status: AC
  Administered 2024-03-08 (×2): 1000 mL via INTRAVENOUS

## 2024-03-08 MED ORDER — IOHEXOL 350 MG/ML SOLN
100.0000 mL | Freq: Once | INTRAVENOUS | Status: AC | PRN
Start: 2024-03-08 — End: 2024-03-08
  Administered 2024-03-08 (×2): 100 mL via INTRAVENOUS

## 2024-03-08 MED ORDER — ONDANSETRON HCL 4 MG/2ML IJ SOLN
4.0000 mg | Freq: Once | INTRAMUSCULAR | Status: AC | PRN
  Administered 2024-03-08 (×2): 4 mg via INTRAVENOUS
  Filled 2024-03-08: qty 2

## 2024-03-08 MED ORDER — METOCLOPRAMIDE HCL 5 MG/ML IJ SOLN
10.0000 mg | Freq: Once | INTRAMUSCULAR | Status: AC
  Administered 2024-03-08 (×2): 10 mg via INTRAVENOUS
  Filled 2024-03-08: qty 2

## 2024-03-08 NOTE — ED Notes (Signed)
Patient aware that urine sample is needed.

## 2024-03-08 NOTE — ED Provider Notes (Signed)
 [facility] [facility] AT [facility] Provider Note  CSN: [id number] Arrival date & time: 03/08/24 1354  Chief Complaint(s) Emesis  HPI Alexa [name] is a 89 y.o. female history of COPD, diabetes, hypertension presenting to the emergency department with chest pain.  Patient reports chest pain, nausea and vomiting, started today.  Began after eating oatmeal.  Reports some shortness of breath also.  No abdominal pain.  No back pain.  No fevers or chills.   Past Medical History Past Medical History:  Diagnosis Date   Angio-edema    Asthma    Chronic obstructive pulmonary disease (HCC)    Diverticulosis of colon (without mention of hemorrhage)    Eczema    Esophageal reflux    History of colonic polyps 06/05/2005   hyperplastic   Internal hemorrhoids    Irritable bowel syndrome    Ischemic colitis (HCC)    Malignant neoplasm of kidney and other and unspecified urinary organs    renal carcinoma, left nephrectomy in 2008-per patient.   Other and unspecified hyperlipidemia    Personal history of venous thrombosis and embolism    PTE after nephrectomy   Spinal stenosis    Type II or unspecified type diabetes mellitus without mention of complication, not stated as uncontrolled 02/05/2012   pt states it was in 2007   Unspecified essential hypertension    Urticaria    Patient Active Problem List   Diagnosis Date Noted   Achalasia 03/08/2024   Type 2 diabetes mellitus with hyperglycemia, without long-term current use of insulin  (HCC) 08/07/2023   H/O left nephrectomy 08/07/2023   Lactic acidosis 08/07/2023   Postural dizziness with presyncope 08/06/2023   SOB (shortness of breath) 06/18/2023   Near syncope 05/22/2023   Constipation 05/22/2023   UTI (urinary tract infection) 02/14/2023   Urinary tract infection 02/13/2023   COPD (chronic obstructive pulmonary disease) (HCC) 02/13/2023   Generalized weakness 02/13/2023   Acute postoperative anemia due to  expected blood loss 12/17/2021   Closed right hip fracture (HCC) 12/15/2021   Stage 3a chronic kidney disease (CKD) (HCC) 12/15/2021   Acute urinary retention 12/15/2021   Acute cystitis without hematuria    Elevated troponin    Allergy to alpha-gal 12/18/2020   Other adverse food reactions, not elsewhere classified, subsequent encounter 12/18/2020   Rash and other nonspecific skin eruption 11/21/2020   Seasonal and perennial allergic rhinitis 11/21/2020   Solitary kidney 12/15/2018   Cough variant asthma 12/29/2014   Spinal stenosis in cervical region 12/13/2014   Radiculopathy, lumbar region 12/13/2014   Sinus arrhythmia 03/23/2009   Depression 05/03/2008   PERONEAL NEUROPATHY 05/03/2008   Malignant neoplasm of kidney excluding renal pelvis (HCC) 08/06/2007   Controlled type 2 diabetes mellitus without complication, without long-term current use of insulin  (HCC) 08/06/2007   GERD 08/06/2007   PULMONARY EMBOLISM, HX OF 08/06/2007   Hyperlipidemia 09/03/2006   Essential hypertension 09/03/2006   IBS 09/03/2006   Home Medication(s) Prior to Admission medications   Medication Sig Start Date End Date Taking? Authorizing Provider  Accu-Chek Softclix Lancets lancets Use as directed 2-4 times daily. 10/03/22   Valentin Skates, DO  acetaminophen  (TYLENOL ) 500 MG tablet Take 500 mg by mouth 2 (two) times daily.    [provider]  acetaminophen  (TYLENOL ) 650 MG CR tablet Take 1 tablet (650 mg total) by mouth every 6 (six) hours as needed for pain. Do NOT chew or crush) 04/10/23     albuterol  (VENTOLIN  HFA) 108 (90  Base) MCG/ACT inhaler Inhale 2 puffs into the lungs every 6 (six) hours as needed for wheezing or shortness of breath.    [provider]  albuterol  (VENTOLIN  HFA) 108 (90 Base) MCG/ACT inhaler Inhale 1 puff into the lungs every 4 (four) hours as needed. 07/24/23   Valentin Skates, DO  amLODipine  (NORVASC ) 5 MG tablet Take 2 tablets (10 mg total) by mouth  daily. Patient taking differently: Take 5 mg by mouth in the morning. 02/17/23   Trixie Nilda HERO, MD  amLODipine  (NORVASC ) 5 MG tablet Take 1 tablet (5 mg total) by mouth daily. 06/23/23   Valentin Skates, DO  amLODipine  (NORVASC ) 5 MG tablet Take 1 tablet (5 mg total) by mouth daily. 07/24/23   Valentin Skates, DO  amLODipine  (NORVASC ) 5 MG tablet Take 1 tablet (5 mg total) by mouth daily. 11/11/22     aspirin  EC 81 MG tablet Take 1 tablet (81 mg total) by mouth daily. Swallow whole. 08/08/23   Shalhoub, Zachary PARAS, MD  Benzalkonium Chloride (DIABETIC BASICS HEALTHY FOOT EX) Apply 1 application  topically See admin instructions. Apply to the feet at bedtime    [provider]  Blood Glucose Monitoring Suppl (ACCU-CHEK GUIDE) w/Device KIT Use as directed 10/03/22   Valentin Skates, DO  cholecalciferol  (VITAMIN D3) 25 MCG (1000 UNIT) tablet Take 1,000 Units by mouth daily.    [provider]  CRANBERRY PO Take 2 tablets by mouth 2 (two) times daily.    [provider]  ESTRACE  VAGINAL 0.1 MG/GM vaginal cream See admin instructions. Apply locally every other night at bedtime 01/16/16   [provider]  estradiol  (ESTRACE  VAGINAL) 0.1 MG/GM vaginal cream Apply vaginally Vaginal 2-3 times per week to help with urinary symptoms 09/11/23     estradiol  (ESTRACE ) 0.1 MG/GM vaginal cream Apply vaginally 2 to 3 times per week to help with urinary symptoms 09/09/23   Valentin Skates, DO  ferrous sulfate  325 (65 FE) MG tablet Take 1 tablet (325 mg total) by mouth daily. 07/24/23   Valentin Skates, DO  fluticasone  (FLONASE ) 50 MCG/ACT nasal spray Place 1 spray into both nostrils in the morning and at bedtime.    [provider]  fluticasone  (FLONASE ) 50 MCG/ACT nasal spray Shake liquid and place 1 spray into both nostrils daily. 09/08/23   Valentin Skates, DO  glucose blood test strip Use as directed once daily. 10/03/22   Valentin Skates, DO  HYDROcodone -acetaminophen  (NORCO/VICODIN)  5-325 MG tablet Take 1 tablet by mouth See admin instructions. Take 1 tablet by mouth in the morning and 1 tablet between midnight and morning    [provider]  hydrOXYzine  (ATARAX ) 10 MG tablet Take 10 mg by mouth daily as needed for anxiety. 11/13/21   [provider]  hydrOXYzine  (ATARAX ) 10 MG tablet Take 1 tablet (10 mg total) by mouth daily as needed. 05/02/23   Valentin Skates, DO  hydrOXYzine  (ATARAX ) 10 MG tablet Take 1 tablet (10 mg total) by mouth daily as needed for anxiety. 04/10/23     ibuprofen  (ADVIL ) 600 MG tablet Take 1 tablet (600 mg total) by mouth every 6 (six) hours as needed for pain 08/25/23     linaclotide  (LINZESS ) 290 MCG CAPS capsule Take 1 capsule (290 mcg total) by mouth in the morning at least 30 minutes before the first meal on an empty stomach. 10/22/22   Yolande Toribio MATSU, MD  linaclotide  (LINZESS ) 290 MCG CAPS capsule Take 1 capsule (290 mcg total) by mouth  in the morning at least 30 minutes before the first meal of the day on an emtpy stomach. 10/22/22   Yolande Toribio MATSU, MD  metFORMIN  (GLUCOPHAGE ) 500 MG tablet Take 1 tablet (500 mg total) by mouth daily with a meal. 05/07/23   Valentin Skates, DO  metoprolol  tartrate (LOPRESSOR ) 25 MG tablet Take 0.5 tablets (12.5 mg total) by mouth 2 (two) times daily. 08/08/23 08/07/24  Shalhoub, Zachary PARAS, MD  metoprolol  tartrate (LOPRESSOR ) 25 MG tablet Take 0.5 tablets (12.5 mg total) by mouth 2 (two) times daily. 08/08/23   Shalhoub, Zachary PARAS, MD  Misc Natural Products (RED WINE COMPLEX PO) Take 1 capsule by mouth daily.    [provider]  Multiple Vitamins-Minerals (PRESERVISION AREDS 2) CAPS Take 1 capsule by mouth 2 (two) times daily after a meal.    [provider]  sitaGLIPtin  (JANUVIA ) 50 MG tablet Take 50 mg by mouth daily as needed (for elevated BGL).    [provider]  SYSTANE COMPLETE PF 0.6 % SOLN Place 1 drop into both eyes 3 (three) times daily as needed (for irritation).     [provider]                                                                                                                                    Past Surgical History Past Surgical History:  Procedure Laterality Date   ADENOIDECTOMY     BREAST BIOPSY     COLONOSCOPY W/ POLYPECTOMY  2000   COLONOSCOPY W/ POLYPECTOMY  07/2004   Hyperplastic polyps   DILATION AND CURETTAGE OF UTERUS  03/2005   Uterine Polyps   NEPHRECTOMY  2007   right,Dr Dahlstedt   SEPTOPLASTY     TONSILLECTOMY     TOTAL HIP ARTHROPLASTY Right 12/16/2021   Procedure: TOTAL HIP ARTHROPLASTY ANTERIOR APPROACH;  Surgeon: Fidel Rogue, MD;  Location: [facility];  Service: Orthopedics;  Laterality: Right;   Family History Family History  Problem Relation Age of Onset   Stroke Mother        onset in 36s   Diabetes Mother    Cancer Mother        bladder; nephrectomy for calculi/also uterine , TAH & BSO   Aneurysm Mother        thoracic   Depression Mother    Lung cancer Father        smoked   Heart failure Sister    Depression Sister    Alzheimer's disease Sister    COPD Sister    Bipolar disorder Sister    Stroke Brother    Heart failure Brother    Aneurysm Brother        cns   Heart failure Maternal Grandfather    Depression Maternal Aunt        X2   Heart attack Other        maternal family history   Alcoholism Neg  Hx     Social History Social History   Tobacco Use   Smoking status: Never   Smokeless tobacco: Never  Vaping Use   Vaping status: Never Used  Substance Use Topics   Alcohol  use: No   Drug use: No   Allergies Lisinopril , Codeine , Hydralazine , and Verapamil  Review of Systems Review of Systems  All other systems reviewed and are negative.   Physical Exam Vital Signs  I have reviewed the triage vital signs BP (!) 148/120 (BP Location: Right Arm)   Pulse (!) 110   Temp 98.5 F (36.9 C) (Oral)   Resp 15   Ht 5' 1 (1.549 m)   Wt 45.4 kg   LMP  (LMP Unknown)    SpO2 97%   BMI 18.89 kg/m  Physical Exam Vitals and nursing note reviewed.  Constitutional:      General: She is not in acute distress.    Appearance: She is well-developed.  HENT:     Head: Normocephalic and atraumatic.     Mouth/Throat:     Mouth: Mucous membranes are moist.     Comments: Spitting up secretions Eyes:     Pupils: Pupils are equal, round, and reactive to light.  Cardiovascular:     Rate and Rhythm: Normal rate and regular rhythm.     Heart sounds: No murmur heard. Pulmonary:     Effort: Pulmonary effort is normal. No respiratory distress.     Breath sounds: Normal breath sounds.  Abdominal:     General: Abdomen is flat.     Palpations: Abdomen is soft.     Tenderness: There is no abdominal tenderness.  Musculoskeletal:        General: No tenderness.     Right lower leg: No edema.     Left lower leg: No edema.  Skin:    General: Skin is warm and dry.  Neurological:     General: No focal deficit present.     Mental Status: She is alert. Mental status is at baseline.  Psychiatric:        Mood and Affect: Mood normal.        Behavior: Behavior normal.     ED Results and Treatments Labs (all labs ordered are listed, but only abnormal results are displayed) Labs Reviewed  LIPASE, BLOOD - Abnormal; Notable for the following components:      Result Value   Lipase 56 (*)    All other components within normal limits  COMPREHENSIVE METABOLIC PANEL WITH GFR - Abnormal; Notable for the following components:   Glucose, Bld 187 (*)    BUN 28 (*)    Total Bilirubin 1.7 (*)    All other components within normal limits  CBC - Abnormal; Notable for the following components:   RBC 5.30 (*)    Hemoglobin 15.9 (*)    HCT 48.6 (*)    All other components within normal limits  BRAIN NATRIURETIC PEPTIDE - Abnormal; Notable for the following components:   B Natriuretic Peptide 103.2 (*)    All other components within normal limits  URINALYSIS, ROUTINE W REFLEX  MICROSCOPIC  TROPONIN I (HIGH SENSITIVITY)  TROPONIN I (HIGH SENSITIVITY)  Radiology CT Angio Chest/Abd/Pel for Dissection W and/or Wo Contrast Result Date: 03/08/2024 CLINICAL DATA:  Chest pain and vomiting, initial encounter EXAM: CT ANGIOGRAPHY CHEST, ABDOMEN AND PELVIS TECHNIQUE: Non-contrast CT of the chest was initially obtained. Multidetector CT imaging through the chest, abdomen and pelvis was performed using the standard protocol during bolus administration of intravenous contrast. Multiplanar reconstructed images and MIPs were obtained and reviewed to evaluate the vascular anatomy. RADIATION DOSE REDUCTION: This exam was performed according to the departmental dose-optimization program which includes automated exposure control, adjustment of the mA and/or kV according to patient size and/or use of iterative reconstruction technique. CONTRAST:  OMNIPAQUE  IOHEXOL  350 MG/ML SOLN COMPARISON:  08/07/2023 FINDINGS: CTA CHEST FINDINGS Cardiovascular: Precontrast images demonstrate atherosclerotic calcifications of the thoracic aorta. No aneurysmal dilatation is seen. No hyperdense crescent to suggest acute aortic injury is noted. Post-contrast images show the left vertebral artery to arise directly from the aorta. No evidence of dissection is noted. No aneurysmal dilatation is seen. Heart is mildly enlarged in size. The pulmonary artery shows a normal branching pattern without intraluminal filling defect to suggest pulmonary embolism. Mediastinum/Nodes: Thoracic inlet is within normal limits. No hilar or mediastinal adenopathy is noted. The esophagus is dilated throughout its course and filled with fluid. A radiopaque density is noted at the gastroesophageal junction likely representing a an ingested tablet. This may also be related to a degree of achalasia. Lungs/Pleura: Lungs are  well aerated bilaterally. Emphysematous changes are seen. Diffuse subpleural fibrotic changes are noted bilaterally. Few scattered tiny parenchymal nodules are noted measuring less than 5 mm. No specific follow-up is recommended. No focal confluent infiltrate or effusion is seen. Musculoskeletal: Degenerative changes of the thoracic spine are seen. Chronic appearing T12 compression deformity is noted. Review of the MIP images confirms the above findings. CTA ABDOMEN AND PELVIS FINDINGS VASCULAR Aorta: Atherosclerotic calcifications are noted without aneurysmal dilatation. Celiac: Patent without evidence of aneurysm, dissection, vasculitis or significant stenosis. SMA: Patent without evidence of aneurysm, dissection, vasculitis or significant stenosis. Renals: Single renal artery is noted on the right. The left artery is not appreciated due to prior left nephrectomy. IMA: Within normal limits. Inflow: Iliacs demonstrate atherosclerotic calcification without acute abnormality. Veins: No specific venous abnormality is noted. Review of the MIP images confirms the above findings. NON-VASCULAR Hepatobiliary: No focal liver abnormality is seen. No gallstones, gallbladder wall thickening, or biliary dilatation. Pancreas: Unremarkable. No pancreatic ductal dilatation or surrounding inflammatory changes. Spleen: Normal in size without focal abnormality. Adrenals/Urinary Tract: Adrenal glands are within normal limits. Left kidney has been surgically removed. Right kidney is well visualized and within normal limits. The bladder is well distended. Stomach/Bowel: Scattered diverticular change of the colon is noted without evidence of diverticulitis. No obstructive changes of the colon are seen. Fecal material is noted throughout without obstructive change. The appendix is within normal limits. Small bowel and stomach are unremarkable. Lymphatic: No lymphadenopathy is noted. Reproductive: Uterus and bilateral adnexa are  unremarkable. Other: No abdominal wall hernia or abnormality. No abdominopelvic ascites. Musculoskeletal: Right hip replacement is noted. No acute bony abnormality is seen. Review of the MIP images confirms the above findings. IMPRESSION: CTA of the chest: No evidence of aortic injury. No aneurysmal dilatation or dissection is noted. Esophageal dilatation with a radiopaque density just above the gastroesophageal junction which may represent a retained pill. Considerable amount of fluid is noted within the esophagus which corresponds with the given clinical history. This may represent a degree of achalasia. Scattered small  less than 5 mm parenchymal nodules. No specific follow-up is recommended. CTA of the abdomen and pelvis: No acute aortic abnormality is noted. Diverticulosis without diverticulitis. No acute abnormality noted. Electronically Signed   By: Oneil Devonshire M.D.   On: 03/08/2024 19:48    Pertinent labs & imaging results that were available during my care of the patient were reviewed by me and considered in my medical decision making (see MDM for details).  Medications Ordered in ED Medications  ondansetron  (ZOFRAN ) injection 4 mg (4 mg Intravenous Given 03/08/24 1414)  metoCLOPramide  (REGLAN ) injection 10 mg (10 mg Intravenous Given 03/08/24 1539)  sodium chloride  0.9 % bolus 1,000 mL (1,000 mLs Intravenous New Bag/Given 03/08/24 1555)  iohexol  (OMNIPAQUE ) 350 MG/ML injection 100 mL (100 mLs Intravenous Contrast Given 03/08/24 1803)                                                                                                                                     Procedures Procedures  (including critical care time)  Medical Decision Making / ED Course   MDM:  89 year old presenting to the emergency department with chest pain.  Patient overall well-appearing, but was spitting up secretions.  History somewhat concerning for possible impaction however patient was eating oatmeal which  make this unusual.  She is also reporting chest pain and shortness of breath, was recently diagnosed with aortic dilatation so CT chest abdomen pelvis was obtained to evaluate for aortic process, intra-abdominal process such as obstruction, or other process such as esophageal perforation or impaction.  CT scan showed possible pill impaction in the distal esophagus.  Following CT scan patient reports she feels much better and is able to tolerate her secretions, suspect pills pass.  Does show a lot of dilatation of the esophagus as well as a lot of fluid in the esophagus.  Discussed with Dr. Wilhelmenia with GI, he recommends that patient be observed overnight, they will consult, plus minus endoscopy depending on how she looks in the morning.      Additional history obtained: -Additional history obtained from ems -External records from outside source obtained and reviewed including: Chart review including previous notes, labs, imaging, consultation notes including prior notes    Lab Tests: -I ordered, reviewed, and interpreted labs.   The pertinent results include:   Labs Reviewed  LIPASE, BLOOD - Abnormal; Notable for the following components:      Result Value   Lipase 56 (*)    All other components within normal limits  COMPREHENSIVE METABOLIC PANEL WITH GFR - Abnormal; Notable for the following components:   Glucose, Bld 187 (*)    BUN 28 (*)    Total Bilirubin 1.7 (*)    All other components within normal limits  CBC - Abnormal; Notable for the following components:   RBC 5.30 (*)    Hemoglobin 15.9 (*)    HCT 48.6 (*)    All  other components within normal limits  BRAIN NATRIURETIC PEPTIDE - Abnormal; Notable for the following components:   B Natriuretic Peptide 103.2 (*)    All other components within normal limits  URINALYSIS, ROUTINE W REFLEX MICROSCOPIC  TROPONIN I (HIGH SENSITIVITY)  TROPONIN I (HIGH SENSITIVITY)    Notable for nonspecific elevated lipase,  hemoconcentration   Imaging Studies ordered: I ordered imaging studies including CT c/a/p On my interpretation imaging demonstrates pill impaction I independently visualized and interpreted imaging. I agree with the radiologist interpretation   Medicines ordered and prescription drug management: Meds ordered this encounter  Medications   ondansetron  (ZOFRAN ) injection 4 mg   metoCLOPramide  (REGLAN ) injection 10 mg   sodium chloride  0.9 % bolus 1,000 mL   iohexol  (OMNIPAQUE ) 350 MG/ML injection 100 mL    -I have reviewed the patients home medicines and have made adjustments as needed   Consultations Obtained: I requested consultation with the gastroenterologist,  and discussed lab and imaging findings as well as pertinent plan - they recommend: admit obs  Social Determinants of Health:  Diagnosis or treatment significantly limited by social determinants of health: lives alone   Reevaluation: After the interventions noted above, I reevaluated the patient and found that their symptoms have resolved  Co morbidities that complicate the patient evaluation  Past Medical History:  Diagnosis Date   Angio-edema    Asthma    Chronic obstructive pulmonary disease (HCC)    Diverticulosis of colon (without mention of hemorrhage)    Eczema    Esophageal reflux    History of colonic polyps 06/05/2005   hyperplastic   Internal hemorrhoids    Irritable bowel syndrome    Ischemic colitis (HCC)    Malignant neoplasm of kidney and other and unspecified urinary organs    renal carcinoma, left nephrectomy in 2008-per patient.   Other and unspecified hyperlipidemia    Personal history of venous thrombosis and embolism    PTE after nephrectomy   Spinal stenosis    Type II or unspecified type diabetes mellitus without mention of complication, not stated as uncontrolled 02/05/2012   pt states it was in 2007   Unspecified essential hypertension    Urticaria        Dispostion: Disposition decision including need for hospitalization was considered, and patient admitted to the hospital.    Final Clinical Impression(s) / ED Diagnoses Final diagnoses:  Pill esophagitis     This chart was dictated using voice recognition software.  Despite best efforts to proofread,  errors can occur which can change the documentation meaning.    Francesca Elsie CROME, MD 03/08/24 2126

## 2024-03-08 NOTE — Plan of Care (Signed)

## 2024-03-08 NOTE — Plan of Care (Signed)

## 2024-03-08 NOTE — ED Notes (Signed)
 Pt son Shameika Speelman would like to be called at [phone]

## 2024-03-08 NOTE — ED Notes (Signed)
 ED TO INPATIENT HANDOFF REPORT  Name/Age/Gender Alexa [name] Guess 89 y.o. female  Code Status Code Status History     Date Active Date Inactive Code Status Order ID Comments User Context   08/06/2023 1615 08/08/2023 1957 Limited: Do not attempt resuscitation (DNR) -DNR-LIMITED -Do Not Intubate/DNI  [id number]  Alexa Katha HERO, MD ED   05/21/2023 2335 05/27/2023 1609 Limited: Do not attempt resuscitation (DNR) -DNR-LIMITED -Do Not Intubate/DNI  [id number]  Alexa Carrier, MD ED   02/13/2023 2059 02/17/2023 1722 DNR [id number]  Alexa Jorie SAUNDERS, MD ED   12/15/2021 0554 12/20/2021 0021 DNR [id number]  Charlton Evalene RAMAN, MD Inpatient   11/22/2014 1451 11/23/2014 1452 Full Code [id number]  Alexa [name] Inpatient   02/05/2012 1623 02/07/2012 1406 Full Code [id number]  Alexa Doyal HERO, RN Inpatient    Questions for Most Recent Historical Code Status (Order [id number])     Question Answer   If pulseless and not breathing No CPR or chest compressions.   In Pre-Arrest Conditions (Patient Is Breathing and Has A Pulse) Do not intubate. Provide all appropriate non-invasive medical interventions. Avoid ICU transfer unless indicated or required.   Consent: Discussion documented in EHR or advanced directives reviewed                Advance Directive Documentation    Flowsheet Row Most Recent Value  Type of Advance Directive Healthcare Power of Attorney, Living will  Pre-existing out of facility DNR order (yellow form or pink MOST form) --  MOST Form in Place? --    Home/SNF/Other Home  Chief Complaint Achalasia [K22.0]  Level of Care/Admitting Diagnosis ED Disposition     ED Disposition  Admit   Condition  --   Comment  Hospital Area: [name] [[id number]]  Level of Care: Telemetry [5]  Admit to tele based on following criteria: Other see comments  Comments: Close monitoring  May place patient in observation at [facility] or [facility] if equivalent level of care  is available:: Yes  Covid Evaluation: Asymptomatic - no recent exposure (last 10 days) testing not required  Diagnosis: Achalasia [[id number]]  Admitting Physician: ALFORNIA MADISON [[id number]]  Attending Physician: RATHORE, VASUNDHRA [[id number]]  For patients discharging to extended facilities (i.e. SNF, AL, group homes or LTAC) initiate:: Discharge to SNF/Facility Placement COVID-19 Lab Testing Protocol          Medical History Past Medical History:  Diagnosis Date   Angio-edema    Asthma    Chronic obstructive pulmonary disease (HCC)    Diverticulosis of colon (without mention of hemorrhage)    Eczema    Esophageal reflux    History of colonic polyps 06/05/2005   hyperplastic   Internal hemorrhoids    Irritable bowel syndrome    Ischemic colitis (HCC)    Malignant neoplasm of kidney and other and unspecified urinary organs    renal carcinoma, left nephrectomy in 2008-per patient.   Other and unspecified hyperlipidemia    Personal history of venous thrombosis and embolism    PTE after nephrectomy   Spinal stenosis    Type II or unspecified type diabetes mellitus without mention of complication, not stated as uncontrolled 02/05/2012   pt states it was in 2007   Unspecified essential hypertension    Urticaria     Allergies Allergies  Allergen Reactions   Lisinopril  Other (See Comments) and Cough    Discontinued by MD Alexa [name]   Codeine  Nausea Only   Hydralazine  Anxiety  Patient get anxious, flushed and tachycardic   Verapamil Other (See Comments)    Pain in feet    IV Location/Drains/Wounds Patient Lines/Drains/Airways Status     Active Line/Drains/Airways     Name Placement date Placement time Site Days   Peripheral IV 03/08/24 20 G Left Antecubital 03/08/24  1554  Antecubital  less than 1            Labs/Imaging Results for orders placed or performed during the hospital encounter of 03/08/24 (from the past 48 hours)  Lipase, blood     Status: Abnormal    Collection Time: 03/08/24  2:07 PM  Result Value Ref Range   Lipase 56 (H) 11 - 51 U/L    Comment: Performed at [facility], [address] [place], [place] [address]  Comprehensive metabolic panel     Status: Abnormal   Collection Time: 03/08/24  2:07 PM  Result Value Ref Range   Sodium 141 135 - 145 mmol/L   Potassium 4.2 3.5 - 5.1 mmol/L   Chloride 104 98 - 111 mmol/L   CO2 23 22 - 32 mmol/L   Glucose, Bld 187 (H) 70 - 99 mg/dL    Comment: Glucose reference range applies only to samples taken after fasting for at least 8 hours.   BUN 28 (H) 8 - 23 mg/dL   Creatinine, Ser 9.11 0.44 - 1.00 mg/dL   Calcium 9.6 8.9 - 89.6 mg/dL   Total Protein 7.5 6.5 - 8.1 g/dL   Albumin  3.9 3.5 - 5.0 g/dL   AST 20 15 - 41 U/L   ALT 16 0 - 44 U/L   Alkaline Phosphatase 72 38 - 126 U/L   Total Bilirubin 1.7 (H) 0.0 - 1.2 mg/dL   GFR, Estimated >39 >39 mL/min    Comment: (NOTE) Calculated using the CKD-EPI Creatinine Equation (2021)    Anion gap 14 5 - 15    Comment: Performed at [facility], [address], [place], [place] [address]  CBC     Status: Abnormal   Collection Time: 03/08/24  2:07 PM  Result Value Ref Range   WBC 8.8 4.0 - 10.5 K/uL   RBC 5.30 (H) 3.87 - 5.11 MIL/uL   Hemoglobin 15.9 (H) 12.0 - 15.0 g/dL   HCT 51.3 (H) 63.9 - 53.9 %   MCV 91.7 80.0 - 100.0 fL   MCH 30.0 26.0 - 34.0 pg   MCHC 32.7 30.0 - 36.0 g/dL   RDW 86.5 88.4 - 84.4 %   Platelets 304 150 - 400 K/uL   nRBC 0.0 0.0 - 0.2 %    Comment: Performed at [facility], [address], [place], [place] [address]  Brain natriuretic peptide     Status: Abnormal   Collection Time: 03/08/24  2:07 PM  Result Value Ref Range   B Natriuretic Peptide 103.2 (H) 0.0 - 100.0 pg/mL    Comment: Performed at [facility], [address] [place], [place] [address]  Troponin I (High Sensitivity)     Status: None   Collection Time: 03/08/24  2:07 PM   Result Value Ref Range   Troponin I (High Sensitivity) 14 <18 ng/L    Comment: (NOTE) Elevated high sensitivity troponin I (hsTnI) values and significant  changes across serial measurements may suggest ACS but many other  chronic and acute conditions are known to elevate hsTnI results.  Refer to the Links section for chest pain algorithms and additional  guidance. Performed at [facility], [address] [place], [place] [address]    CT Angio Chest/Abd/Pel for Dissection W and/or Wo Contrast Result Date: 03/08/2024 CLINICAL DATA:  Chest pain and vomiting, initial encounter EXAM: CT ANGIOGRAPHY CHEST, ABDOMEN AND PELVIS TECHNIQUE: Non-contrast CT of the chest was initially obtained. Multidetector CT imaging through the chest, abdomen and pelvis was performed using the standard protocol during bolus administration of intravenous contrast. Multiplanar reconstructed images and MIPs were obtained and reviewed to evaluate the vascular anatomy. RADIATION DOSE REDUCTION: This exam was performed according to the departmental dose-optimization program which includes automated exposure control, adjustment of the mA and/or kV according to patient size and/or use of iterative reconstruction technique. CONTRAST:  OMNIPAQUE  IOHEXOL  350 MG/ML SOLN COMPARISON:  08/07/2023 FINDINGS: CTA CHEST FINDINGS Cardiovascular: Precontrast images demonstrate atherosclerotic calcifications of the thoracic aorta. No aneurysmal dilatation is seen. No hyperdense crescent to suggest acute aortic injury is noted. Post-contrast images show the left vertebral artery to arise directly from the aorta. No evidence of dissection is noted. No aneurysmal dilatation is seen. Heart is mildly enlarged in size. The pulmonary artery shows a normal branching pattern without intraluminal filling defect to suggest pulmonary embolism. Mediastinum/Nodes: Thoracic inlet is within normal limits. No hilar or mediastinal  adenopathy is noted. The esophagus is dilated throughout its course and filled with fluid. A radiopaque density is noted at the gastroesophageal junction likely representing a an ingested tablet. This may also be related to a degree of achalasia. Lungs/Pleura: Lungs are well aerated bilaterally. Emphysematous changes are seen. Diffuse subpleural fibrotic changes are noted bilaterally. Few scattered tiny parenchymal nodules are noted measuring less than 5 mm. No specific follow-up is recommended. No focal confluent infiltrate or effusion is seen. Musculoskeletal: Degenerative changes of the thoracic spine are seen. Chronic appearing T12 compression deformity is noted. Review of the MIP images confirms the above findings. CTA ABDOMEN AND PELVIS FINDINGS VASCULAR Aorta: Atherosclerotic calcifications are noted without aneurysmal dilatation. Celiac: Patent without evidence of aneurysm, dissection, vasculitis or significant stenosis. SMA: Patent without evidence of aneurysm, dissection, vasculitis or significant stenosis. Renals: Single renal artery is noted on the right. The left artery is not appreciated due to prior left nephrectomy. IMA: Within normal limits. Inflow: Iliacs demonstrate atherosclerotic calcification without acute abnormality. Veins: No specific venous abnormality is noted. Review of the MIP images confirms the above findings. NON-VASCULAR Hepatobiliary: No focal liver abnormality is seen. No gallstones, gallbladder wall thickening, or biliary dilatation. Pancreas: Unremarkable. No pancreatic ductal dilatation or surrounding inflammatory changes. Spleen: Normal in size without focal abnormality. Adrenals/Urinary Tract: Adrenal glands are within normal limits. Left kidney has been surgically removed. Right kidney is well visualized and within normal limits. The bladder is well distended. Stomach/Bowel: Scattered diverticular change of the colon is noted without evidence of diverticulitis. No obstructive  changes of the colon are seen. Fecal material is noted throughout without obstructive change. The appendix is within normal limits. Small bowel and stomach are unremarkable. Lymphatic: No lymphadenopathy is noted. Reproductive: Uterus and bilateral adnexa are unremarkable. Other: No abdominal wall hernia or abnormality. No abdominopelvic ascites. Musculoskeletal: Right hip replacement is noted. No acute bony abnormality is seen. Review of the MIP images confirms the above findings. IMPRESSION: CTA of the chest: No evidence of aortic injury. No aneurysmal dilatation or dissection is noted. Esophageal dilatation with a radiopaque density just above the gastroesophageal junction which may represent a retained pill. Considerable amount of fluid is noted within the esophagus  which corresponds with the given clinical history. This may represent a degree of achalasia. Scattered small less than 5 mm parenchymal nodules. No specific follow-up is recommended. CTA of the abdomen and pelvis: No acute aortic abnormality is noted. Diverticulosis without diverticulitis. No acute abnormality noted. Electronically Signed   By: Oneil Devonshire M.D.   On: 03/08/2024 19:48    Pending Labs Unresulted Labs (From admission, onward)     Start     Ordered   03/08/24 1404  Urinalysis, Routine w reflex microscopic -Urine, Clean Catch  Once,   URGENT       Question:  Specimen Source  Answer:  Urine, Clean Catch   03/08/24 1404            Vitals/Pain Today's Vitals   03/08/24 1430 03/08/24 1600 03/08/24 1700 03/08/24 1930  BP: 124/76 131/78 (!) 158/97 (!) 148/120  Pulse: 85 91 85 (!) 110  Resp: 17  16 15   Temp:    98.5 F (36.9 C)  TempSrc:    Oral  SpO2: 92% 90% 94% 97%  Weight:      Height:      PainSc:        Isolation Precautions No active isolations  Medications Medications  ondansetron  (ZOFRAN ) injection 4 mg (4 mg Intravenous Given 03/08/24 1414)  metoCLOPramide  (REGLAN ) injection 10 mg (10 mg  Intravenous Given 03/08/24 1539)  sodium chloride  0.9 % bolus 1,000 mL (1,000 mLs Intravenous New Bag/Given 03/08/24 1555)  iohexol  (OMNIPAQUE ) 350 MG/ML injection 100 mL (100 mLs Intravenous Contrast Given 03/08/24 1803)    Mobility non-ambulatory

## 2024-03-08 NOTE — ED Triage Notes (Addendum)
 Patient BIB [facility] from home. Woke up today and began vomiting at 9:30am today. Stated it burns when she vomits. No abdominal pain. Feels like something is stuck in her chest. Has had a dry cough at night this week.

## 2024-03-09 ENCOUNTER — Observation Stay (HOSPITAL_COMMUNITY)

## 2024-03-09 DIAGNOSIS — R1310 Dysphagia, unspecified: Secondary | ICD-10-CM

## 2024-03-09 DIAGNOSIS — I129 Hypertensive chronic kidney disease with stage 1 through stage 4 chronic kidney disease, or unspecified chronic kidney disease: Secondary | ICD-10-CM | POA: Diagnosis not present

## 2024-03-09 DIAGNOSIS — M4802 Spinal stenosis, cervical region: Secondary | ICD-10-CM | POA: Diagnosis not present

## 2024-03-09 DIAGNOSIS — R933 Abnormal findings on diagnostic imaging of other parts of digestive tract: Secondary | ICD-10-CM | POA: Diagnosis not present

## 2024-03-09 DIAGNOSIS — K2289 Other specified disease of esophagus: Secondary | ICD-10-CM | POA: Diagnosis not present

## 2024-03-09 DIAGNOSIS — F32A Depression, unspecified: Secondary | ICD-10-CM | POA: Diagnosis not present

## 2024-03-09 DIAGNOSIS — M879 Osteonecrosis, unspecified: Secondary | ICD-10-CM | POA: Diagnosis not present

## 2024-03-09 DIAGNOSIS — R911 Solitary pulmonary nodule: Secondary | ICD-10-CM

## 2024-03-09 DIAGNOSIS — E1122 Type 2 diabetes mellitus with diabetic chronic kidney disease: Secondary | ICD-10-CM | POA: Diagnosis not present

## 2024-03-09 DIAGNOSIS — J4489 Other specified chronic obstructive pulmonary disease: Secondary | ICD-10-CM | POA: Diagnosis not present

## 2024-03-09 DIAGNOSIS — N1831 Chronic kidney disease, stage 3a: Secondary | ICD-10-CM | POA: Diagnosis not present

## 2024-03-09 DIAGNOSIS — K222 Esophageal obstruction: Secondary | ICD-10-CM | POA: Diagnosis not present

## 2024-03-09 DIAGNOSIS — K224 Dyskinesia of esophagus: Secondary | ICD-10-CM | POA: Diagnosis not present

## 2024-03-09 DIAGNOSIS — R0789 Other chest pain: Secondary | ICD-10-CM

## 2024-03-09 DIAGNOSIS — F0393 Unspecified dementia, unspecified severity, with mood disturbance: Secondary | ICD-10-CM | POA: Diagnosis not present

## 2024-03-09 DIAGNOSIS — R079 Chest pain, unspecified: Secondary | ICD-10-CM | POA: Diagnosis not present

## 2024-03-09 DIAGNOSIS — I479 Paroxysmal tachycardia, unspecified: Secondary | ICD-10-CM | POA: Diagnosis not present

## 2024-03-09 DIAGNOSIS — F0394 Unspecified dementia, unspecified severity, with anxiety: Secondary | ICD-10-CM | POA: Diagnosis not present

## 2024-03-09 DIAGNOSIS — M810 Age-related osteoporosis without current pathological fracture: Secondary | ICD-10-CM | POA: Diagnosis not present

## 2024-03-09 DIAGNOSIS — M5416 Radiculopathy, lumbar region: Secondary | ICD-10-CM | POA: Diagnosis not present

## 2024-03-09 LAB — COMPREHENSIVE METABOLIC PANEL WITH GFR
ALT: 9 U/L (ref 0–44)
AST: 25 U/L (ref 15–41)
Albumin: 3.6 g/dL (ref 3.5–5.0)
Alkaline Phosphatase: 69 U/L (ref 38–126)
Anion gap: 12 (ref 5–15)
BUN: 20 mg/dL (ref 8–23)
CO2: 21 mmol/L — ABNORMAL LOW (ref 22–32)
Calcium: 8.7 mg/dL — ABNORMAL LOW (ref 8.9–10.3)
Chloride: 108 mmol/L (ref 98–111)
Creatinine, Ser: 0.75 mg/dL (ref 0.44–1.00)
GFR, Estimated: >60 mL/min (ref >60)
Glucose, Bld: 95 mg/dL (ref 70–99)
Potassium: 3.8 mmol/L (ref 3.5–5.1)
Sodium: 141 mmol/L (ref 135–145)
Total Bilirubin: 2.1 mg/dL — ABNORMAL HIGH (ref 0.0–1.2)
Total Protein: 7 g/dL (ref 6.5–8.1)

## 2024-03-09 LAB — GLUCOSE, CAPILLARY
Glucose-Capillary: 145 mg/dL — ABNORMAL HIGH (ref 70–99)
Glucose-Capillary: 91 mg/dL (ref 70–99)
Glucose-Capillary: 121 mg/dL — ABNORMAL HIGH (ref 70–99)
Glucose-Capillary: 138 mg/dL — ABNORMAL HIGH (ref 70–99)
Glucose-Capillary: 75 mg/dL (ref 70–99)

## 2024-03-09 LAB — BILIRUBIN, DIRECT: Bilirubin, Direct: 0.3 mg/dL — ABNORMAL HIGH (ref 0.0–0.2)

## 2024-03-09 MED ORDER — SODIUM CHLORIDE 0.9 % IV SOLN: INTRAVENOUS | Status: AC

## 2024-03-09 MED ORDER — LABETALOL HCL 5 MG/ML IV SOLN
5.0000 mg | INTRAVENOUS | Status: DC | PRN
  Administered 2024-03-09 (×4): 5 mg via INTRAVENOUS
  Filled 2024-03-09 (×5): qty 4

## 2024-03-09 MED ORDER — SODIUM CHLORIDE 0.9 % IV SOLN: INTRAVENOUS | Status: DC

## 2024-03-09 MED ORDER — ACETAMINOPHEN 160 MG/5ML PO SOLN
650.0000 mg | ORAL | Status: DC | PRN
  Administered 2024-03-09 – 2024-03-11 (×7): 650 mg via ORAL
  Filled 2024-03-09 (×3): qty 20.3

## 2024-03-09 MED ORDER — DIPHENHYDRAMINE HCL 50 MG/ML IJ SOLN
12.5000 mg | Freq: Once | INTRAMUSCULAR | Status: AC
  Administered 2024-03-09 (×2): 12.5 mg via INTRAVENOUS
  Filled 2024-03-09: qty 1

## 2024-03-09 MED ORDER — INSULIN ASPART 100 UNIT/ML IJ SOLN
0.0000 [IU] | INTRAMUSCULAR | Status: DC
  Administered 2024-03-09 (×6): 1 [IU] via SUBCUTANEOUS
  Administered 2024-03-10 (×2): 3 [IU] via SUBCUTANEOUS

## 2024-03-09 MED ORDER — ONDANSETRON HCL 4 MG/2ML IJ SOLN
4.0000 mg | Freq: Four times a day (QID) | INTRAMUSCULAR | Status: DC | PRN
  Administered 2024-03-11: 4 mg via INTRAVENOUS
  Filled 2024-03-09: qty 2

## 2024-03-09 MED ORDER — LABETALOL HCL 5 MG/ML IV SOLN: 10.0000 mg | INTRAVENOUS | Status: DC | PRN

## 2024-03-09 MED ORDER — PANTOPRAZOLE SODIUM 40 MG IV SOLR
40.0000 mg | INTRAVENOUS | Status: DC
  Administered 2024-03-09 – 2024-03-11 (×5): 40 mg via INTRAVENOUS
  Filled 2024-03-09 (×3): qty 10

## 2024-03-09 NOTE — Progress Notes (Signed)
 Progress Note    Alexa [name]   FMW:[id number]  DOB: [birth year]  DOA: 03/08/2024     0 PCP: Valentin Skates, DO  Initial CC: Difficulty swallowing  Hospital Course: Alexa [name] is a 89 y.o. female with medical history significant of asthma/COPD, angioedema, GERD, IBS, ischemic colitis, renal carcinoma status post left nephrectomy in 2008, hyperlipidemia, history of VTE, type 2 diabetes, hypertension, anxiety presenting with complaints of chest pain, nausea, and vomiting.    Patient states sometimes after she eats food gets stuck in her throat.  She took a pill in the morning (does not remember which medication) and afterwards went to the kitchen to have breakfast.  She had a few sips of coffee and ate oatmeal and afterwards started having substernal chest pain and vomiting.  She had multiple episodes of vomiting throughout the day and was not able to tolerate p.o. intake so she came into the ED to be evaluated.  Denies any further episodes of vomiting since arrival to the hospital.  No longer having chest pain.  Denies abdominal pain.  No other complaints.  She never had an EGD done in the past.  CT angio of the chest was performed which was negative for aortic injury or aneurysmal dilation or dissection.  Showed esophageal dilation with a radiopaque density just above the GE junction which may have represented a retained pill.  There was also fluid noted within the esophagus and possibly representing achalasia or other abnormality.  Case was discussed with GI and she was recommended for admission and further evaluation with possible EGD.   Assessment and Plan  Noncardiac chest pain Nausea/vomiting Dysphagia - Troponin negative x 2, not consistent with ACS.   - Symptoms started after she took a pill and developed difficulty swallowing and epigastric pain; given chronic issue with acute worsening, may have progression of underlying esophageal stenosis versus mass versus achalasia versus  other etiology - At this time she appears to be too nervous for EGD although despite diet advancement today, still having difficulty tolerating -Barium swallow performed 03/09/2024 showing moderate esophageal dysmotility and a probable distal esophageal stricture but not optimally evaluated - GI following, appreciate assistance - Patient to give further consideration to EGD especially if she is unable to tolerate diet advancement; further discussions in the morning - N.p.o. at midnight in case decides to have EGD tomorrow   Incidental pulmonary nodules CT showing scattered small less than 5 mm parenchymal nodules.  - No specific follow-up indicated.   Type 2 diabetes A1c 6.1 in January 2025 - Follow-up pending A1c -Continue SSI and CBG monitoring   Asthma/COPD: Stable, no signs of acute exacerbation. GERD -continue Protonix  Hyperlipidemia -hold any home meds for now Hypertension: PRN meds Anxiety -hold Atarax  for now  Interval History:  Feeling a little bit better when seen this morning.  No longer having any further chest pain or epigastric pain.  Still nervous about EGD.  Originally she declined But when diet was advanced this afternoon she is not tolerating. Will see how she does overnight and further decisions to be made in the morning.  Old records reviewed in assessment of this patient  Antimicrobials:   DVT prophylaxis:  SCDs Start: 03/09/24 0321   Code Status:   Code Status: Do not attempt resuscitation (DNR) PRE-ARREST INTERVENTIONS DESIRED  Mobility Assessment (Last 72 Hours)     Mobility Assessment     Row Name 03/09/24 0839 03/08/24 2226         Does  the patient have exclusion criteria? No - Perform mobility assessment No - Perform mobility assessment      What is the highest level of mobility based on the mobility assessment? Level 4 (Ambulates with assistance) - Balance while stepping forward/back - Complete Level 4 (Ambulates with assistance) - Balance  while stepping forward/back - Complete         Barriers to discharge: None Disposition Plan: Home HH orders placed: N/A Status is: Observation  Objective: Blood pressure (!) 179/86, pulse 66, temperature 98.3 F (36.8 C), temperature source Oral, resp. rate 20, height 5' 1 (1.549 m), weight 45.4 kg, SpO2 98%.  Examination:  Physical Exam Constitutional:      Appearance: Normal appearance.  HENT:     Head: Normocephalic and atraumatic.     Mouth/Throat:     Mouth: Mucous membranes are moist.  Eyes:     Extraocular Movements: Extraocular movements intact.  Cardiovascular:     Rate and Rhythm: Normal rate and regular rhythm.  Pulmonary:     Effort: Pulmonary effort is normal. No respiratory distress.     Breath sounds: Normal breath sounds. No wheezing.  Abdominal:     General: Bowel sounds are normal. There is no distension.     Palpations: Abdomen is soft.     Tenderness: There is no abdominal tenderness.  Musculoskeletal:        General: Normal range of motion.     Cervical back: Normal range of motion and neck supple.  Skin:    General: Skin is warm and dry.  Neurological:     General: No focal deficit present.     Mental Status: She is alert.  Psychiatric:        Mood and Affect: Mood normal.      Consultants:  GI  Procedures:    Data Reviewed: Results for orders placed or performed during the hospital encounter of 03/08/24 (from the past 24 hours)  Troponin I (High Sensitivity)     Status: None   Collection Time: 03/08/24 10:42 PM  Result Value Ref Range   Troponin I (High Sensitivity) 16 <18 ng/L  Glucose, capillary     Status: Abnormal   Collection Time: 03/09/24  3:47 AM  Result Value Ref Range   Glucose-Capillary 145 (H) 70 - 99 mg/dL  Comprehensive metabolic panel     Status: Abnormal   Collection Time: 03/09/24  5:14 AM  Result Value Ref Range   Sodium 141 135 - 145 mmol/L   Potassium 3.8 3.5 - 5.1 mmol/L   Chloride 108 98 - 111 mmol/L   CO2  21 (L) 22 - 32 mmol/L   Glucose, Bld 95 70 - 99 mg/dL   BUN 20 8 - 23 mg/dL   Creatinine, Ser 9.24 0.44 - 1.00 mg/dL   Calcium 8.7 (L) 8.9 - 10.3 mg/dL   Total Protein 7.0 6.5 - 8.1 g/dL   Albumin  3.6 3.5 - 5.0 g/dL   AST 25 15 - 41 U/L   ALT 9 0 - 44 U/L   Alkaline Phosphatase 69 38 - 126 U/L   Total Bilirubin 2.1 (H) 0.0 - 1.2 mg/dL   GFR, Estimated >39 >39 mL/min   Anion gap 12 5 - 15  Bilirubin, direct     Status: Abnormal   Collection Time: 03/09/24  5:26 AM  Result Value Ref Range   Bilirubin, Direct 0.3 (H) 0.0 - 0.2 mg/dL  Glucose, capillary     Status: None   Collection Time:  03/09/24  7:39 AM  Result Value Ref Range   Glucose-Capillary 91 70 - 99 mg/dL  Glucose, capillary     Status: Abnormal   Collection Time: 03/09/24  1:08 PM  Result Value Ref Range   Glucose-Capillary 121 (H) 70 - 99 mg/dL    I have reviewed pertinent nursing notes, vitals, labs, and images as necessary. I have ordered labwork to follow up on as indicated.  I have reviewed the last notes from staff over past 24 hours. I have discussed patient's care plan and test results with nursing staff, CM/SW, and other staff as appropriate.  Time spent: Greater than 50% of the 55 minute visit was spent in counseling/coordination of care for the patient as laid out in the A&P.   LOS: 0 days   Alm Apo, MD [facility] 03/09/2024, 4:35 PM

## 2024-03-09 NOTE — H&P (View-Only) (Signed)
 Referring Provider: Dr. Editha Ram Primary Care Physician:  Valentin Skates, DO Primary Gastroenterologist:  Dr.Gessner  Reason for Consultation:  Atypical chest pain, dysphagia   HPI: Alexa [name] is a 89 y.o. female with a past medical history of hypertension, asthma, COPD, DM type II, renal cell carcinoma s/p left nephrectomy 2008, PE 07/2007, diverticulosis, ischemic colitis and hyperplastic colon polyps.   She developed N/V, felt like pills were stuck in her esophagus after drinking 2 sips of coffee and one bite of instant oatmeal. She presented to the ED 03/08/2024 via EMS for further evaluation.  Labs in the ED showed a WBC count of 8.8.  Hemoglobin 15.9.  Hematocrit 48.6.  Platelets 304.  BUN 28.  Creatinine 0.88.  Total bili 1.7.  Alk phos 72.  AST 20.  ALT 16.  Lipase 56.  BNP 103.2.  Troponin 14  and 16.  Chest/abdominal/pelvic CTA was negative aortic dissection and showed a dilated esophagus above true esophageal junction possibly representing a retained pill with considerable amount of fluid in the esophagus possibly representing achalasia and diverticulosis without diverticulitis.  She received Metoclopramide  10mg  IV and Ondansetron  4mg  IV and her symptoms improved.   Labs today: BUN 20.  Creatinine 0.75.  Total bili 2.1.  Alk phos 69.  AST 25.  ALT 9.  A GI consult was requested for further evaluation, consideration for EGD.  She endorses having a runny nose, cough and chest heaviness x 1 week. She drank 2 sips of coffee and one bite of oatmeal yesterday morning, couldn't swallow the oatmeal and spit it out then started vomiting up clear to white foam. She also felt like her am BP med and bone supplement pills were stuck in her throat. No coffee ground emesis or frank red hematemesis. She noted for the past week, she felt a vague difficulty swallowing, something just didn't feel right, food did not get stuck in her throat or esophagus. She continues to spit up thick phlegm  overnight and this morning without further vomiting. She has infrequent heartburn which typical occurs if she eats acidic foods such as spaghetti with tomato sauce relieved by drinking soda pop. She denies having any upper or lower abdominal pain. Never had an EGD. She takes ASA 81 mg once daily. Denies other NSAID use. No alcohol  use. Non-smoker. She has chronic constipation, passes small balls of stool most days after using a Dulcolax suppository.  She takes a stool softener and MiraLAX  as needed.  No bloody or black stools.   ECHO 08/20/2023:  Left ventricular ejection fraction, by estimation, is 50 to 55%. The left ventricle has low normal function. The left ventricle has no regional wall motion abnormalities. Left ventricular diastolic parameters are consistent with Grade I diastolic dysfunction (impaired relaxation). The average left ventricular global longitudinal strain is -16.0 %. The global longitudinal strain is abnormal. 1. Right ventricular systolic function is normal. The right ventricular size is normal. There is normal pulmonary artery systolic pressure. The estimated right ventricular systolic pressure is 24.5 mmHg. 2. 3. Trivial mitral valve regurgitation. 4. The aortic valve is tricuspid. Aortic valve regurgitation is mild. 5. Aneurysm of the aortic root, measuring 49 mm. The inferior vena cava is normal in size with greater than 50% respiratory variability, suggesting right atrial pressure of 3 mmHg.   GI PROCEDURES:   Colonoscopy 06/05/2005:  Multiple hyperplastic polyps removed from the sigmoid and rectum  Diverticulosis  Internal hemorrhoids   Colonoscopy 09/13/1997:  3 small polyps removed from  the right colon  3 small polyps removed from the sigmoid  Past Medical History:  Diagnosis Date   Angio-edema    Asthma    Chronic obstructive pulmonary disease (HCC)    Diverticulosis of colon (without mention of hemorrhage)    Eczema    Esophageal reflux    History of colonic  polyps 06/05/2005   hyperplastic   Internal hemorrhoids    Irritable bowel syndrome    Ischemic colitis (HCC)    Malignant neoplasm of kidney and other and unspecified urinary organs    renal carcinoma, left nephrectomy in 2008-per patient.   Other and unspecified hyperlipidemia    Personal history of venous thrombosis and embolism    PTE after nephrectomy   Spinal stenosis    Type II or unspecified type diabetes mellitus without mention of complication, not stated as uncontrolled 02/05/2012   pt states it was in 2007   Unspecified essential hypertension    Urticaria     Past Surgical History:  Procedure Laterality Date   ADENOIDECTOMY     BREAST BIOPSY     COLONOSCOPY W/ POLYPECTOMY  2000   COLONOSCOPY W/ POLYPECTOMY  07/2004   Hyperplastic polyps   DILATION AND CURETTAGE OF UTERUS  03/2005   Uterine Polyps   NEPHRECTOMY  2007   right,Dr Dahlstedt   SEPTOPLASTY     TONSILLECTOMY     TOTAL HIP ARTHROPLASTY Right 12/16/2021   Procedure: TOTAL HIP ARTHROPLASTY ANTERIOR APPROACH;  Surgeon: Fidel Rogue, MD;  Location: [facility];  Service: Orthopedics;  Laterality: Right;    Prior to Admission medications   Medication Sig Start Date End Date Taking? Authorizing Provider  Accu-Chek Softclix Lancets lancets Use as directed 2-4 times daily. 10/03/22   Valentin Skates, DO  acetaminophen  (TYLENOL ) 500 MG tablet Take 500 mg by mouth 2 (two) times daily.    [provider]  acetaminophen  (TYLENOL ) 650 MG CR tablet Take 1 tablet (650 mg total) by mouth every 6 (six) hours as needed for pain. Do NOT chew or crush) 04/10/23     albuterol  (VENTOLIN  HFA) 108 (90 Base) MCG/ACT inhaler Inhale 2 puffs into the lungs every 6 (six) hours as needed for wheezing or shortness of breath.    [provider]  albuterol  (VENTOLIN  HFA) 108 (90 Base) MCG/ACT inhaler Inhale 1 puff into the lungs every 4 (four) hours as needed. 07/24/23   Valentin Skates, DO  amLODipine  (NORVASC ) 5 MG tablet  Take 2 tablets (10 mg total) by mouth daily. Patient taking differently: Take 5 mg by mouth in the morning. 02/17/23   Trixie Nilda HERO, MD  amLODipine  (NORVASC ) 5 MG tablet Take 1 tablet (5 mg total) by mouth daily. 06/23/23   Valentin Skates, DO  amLODipine  (NORVASC ) 5 MG tablet Take 1 tablet (5 mg total) by mouth daily. 07/24/23   Valentin Skates, DO  amLODipine  (NORVASC ) 5 MG tablet Take 1 tablet (5 mg total) by mouth daily. 11/11/22     aspirin  EC 81 MG tablet Take 1 tablet (81 mg total) by mouth daily. Swallow whole. 08/08/23   Shalhoub, Zachary PARAS, MD  Benzalkonium Chloride (DIABETIC BASICS HEALTHY FOOT EX) Apply 1 application  topically See admin instructions. Apply to the feet at bedtime    [provider]  Blood Glucose Monitoring Suppl (ACCU-CHEK GUIDE) w/Device KIT Use as directed 10/03/22   Valentin Skates, DO  cholecalciferol  (VITAMIN D3) 25 MCG (1000 UNIT) tablet Take 1,000 Units by mouth daily.    [provider]  CRANBERRY PO Take 2 tablets by mouth 2 (two) times daily.    [provider]  ESTRACE  VAGINAL 0.1 MG/GM vaginal cream See admin instructions. Apply locally every other night at bedtime 01/16/16   [provider]  estradiol  (ESTRACE  VAGINAL) 0.1 MG/GM vaginal cream Apply vaginally Vaginal 2-3 times per week to help with urinary symptoms 09/11/23     estradiol  (ESTRACE ) 0.1 MG/GM vaginal cream Apply vaginally 2 to 3 times per week to help with urinary symptoms 09/09/23   Valentin Skates, DO  ferrous sulfate  325 (65 FE) MG tablet Take 1 tablet (325 mg total) by mouth daily. 07/24/23   Valentin Skates, DO  fluticasone  (FLONASE ) 50 MCG/ACT nasal spray Place 1 spray into both nostrils in the morning and at bedtime.    [provider]  fluticasone  (FLONASE ) 50 MCG/ACT nasal spray Shake liquid and place 1 spray into both nostrils daily. 09/08/23   Valentin Skates, DO  glucose blood test strip Use as directed once daily. 10/03/22   Valentin Skates, DO   HYDROcodone -acetaminophen  (NORCO/VICODIN) 5-325 MG tablet Take 1 tablet by mouth See admin instructions. Take 1 tablet by mouth in the morning and 1 tablet between midnight and morning    [provider]  hydrOXYzine  (ATARAX ) 10 MG tablet Take 10 mg by mouth daily as needed for anxiety. 11/13/21   [provider]  hydrOXYzine  (ATARAX ) 10 MG tablet Take 1 tablet (10 mg total) by mouth daily as needed. 05/02/23   Valentin Skates, DO  hydrOXYzine  (ATARAX ) 10 MG tablet Take 1 tablet (10 mg total) by mouth daily as needed for anxiety. 04/10/23     ibuprofen  (ADVIL ) 600 MG tablet Take 1 tablet (600 mg total) by mouth every 6 (six) hours as needed for pain 08/25/23     linaclotide  (LINZESS ) 290 MCG CAPS capsule Take 1 capsule (290 mcg total) by mouth in the morning at least 30 minutes before the first meal on an empty stomach. 10/22/22   Yolande Toribio MATSU, MD  linaclotide  (LINZESS ) 290 MCG CAPS capsule Take 1 capsule (290 mcg total) by mouth in the morning at least 30 minutes before the first meal of the day on an emtpy stomach. 10/22/22   Yolande Toribio MATSU, MD  metFORMIN  (GLUCOPHAGE ) 500 MG tablet Take 1 tablet (500 mg total) by mouth daily with a meal. 05/07/23   Valentin Skates, DO  metoprolol  tartrate (LOPRESSOR ) 25 MG tablet Take 0.5 tablets (12.5 mg total) by mouth 2 (two) times daily. 08/08/23 08/07/24  Shalhoub, Zachary PARAS, MD  metoprolol  tartrate (LOPRESSOR ) 25 MG tablet Take 0.5 tablets (12.5 mg total) by mouth 2 (two) times daily. 08/08/23   Shalhoub, Zachary PARAS, MD  Misc Natural Products (RED WINE COMPLEX PO) Take 1 capsule by mouth daily.    [provider]  Multiple Vitamins-Minerals (PRESERVISION AREDS 2) CAPS Take 1 capsule by mouth 2 (two) times daily after a meal.    [provider]  sitaGLIPtin  (JANUVIA ) 50 MG tablet Take 50 mg by mouth daily as needed (for elevated BGL).    [provider]  SYSTANE COMPLETE PF 0.6 % SOLN Place 1 drop into both eyes 3 (three)  times daily as needed (for irritation).    [provider]    Current Facility-Administered Medications  Medication Dose Route Frequency Provider Last Rate Last Admin   0.9 %  sodium chloride  infusion   Intravenous Continuous Alfornia Madison, MD 75 mL/hr at 03/09/24 0353 New Bag at 03/09/24 [phone]  insulin  aspart (novoLOG ) injection 0-9 Units  0-9 Units Subcutaneous Q4H Rathore, Vasundhra, MD   1 Units at 03/09/24 9647   labetalol  (NORMODYNE ) injection 5 mg  5 mg Intravenous Q2H PRN Alfornia Madison, MD   5 mg at 03/09/24 9666    Allergies as of 03/08/2024 - Review Complete 03/08/2024  Allergen Reaction Noted   Lisinopril  Other (See Comments) and Cough 02/14/2015   Codeine  Nausea Only 03/02/2007   Hydralazine  Anxiety 08/07/2023   Verapamil Other (See Comments) 01/06/2012    Family History  Problem Relation Age of Onset   Stroke Mother        onset in 9s   Diabetes Mother    Cancer Mother        bladder; nephrectomy for calculi/also uterine , TAH & BSO   Aneurysm Mother        thoracic   Depression Mother    Lung cancer Father        smoked   Heart failure Sister    Depression Sister    Alzheimer's disease Sister    COPD Sister    Bipolar disorder Sister    Stroke Brother    Heart failure Brother    Aneurysm Brother        cns   Heart failure Maternal Grandfather    Depression Maternal Aunt        X2   Heart attack Other        maternal family history   Alcoholism Neg Hx     Social History   Socioeconomic History   Marital status: Widowed    Spouse name: Not on file   Number of children: 2   Years of education: Not on file   Highest education level: Not on file  Occupational History   Not on file  Tobacco Use   Smoking status: Never   Smokeless tobacco: Never  Vaping Use   Vaping status: Never Used  Substance and Sexual Activity   Alcohol  use: No   Drug use: No   Sexual activity: Not on file  Other Topics Concern   Not on file  Social  History Narrative   ** Merged History Encounter **       Social Drivers of Health   Financial Resource Strain: Low Risk  (08/08/2020)   Overall Financial Resource Strain (CARDIA)    Difficulty of Paying Living Expenses: Not hard at all  Food Insecurity: No Food Insecurity (08/07/2023)   Hunger Vital Sign    Worried About Running Out of Food in the Last Year: Never true    Ran Out of Food in the Last Year: Never true  Transportation Needs: No Transportation Needs (08/07/2023)   PRAPARE - Administrator, Civil Service (Medical): No    Lack of Transportation (Non-Medical): No  Physical Activity: Not on file  Stress: Not on file  Social Connections: Socially Isolated (08/07/2023)   Social Connection and Isolation Panel    Frequency of Communication with Friends and Family: More than three times a week    Frequency of Social Gatherings with Friends and Family: More than three times a week    Attends Religious Services: Never    Database administrator or Organizations: No    Attends Banker Meetings: Never    Marital Status: Widowed  Intimate Partner Violence: Not At Risk (08/07/2023)   Humiliation, Afraid, Rape, and Kick questionnaire    Fear of Current or Ex-Partner: No    Emotionally Abused:  No    Physically Abused: No    Sexually Abused: No    Review of Systems: Gen: Denies fever, sweats or chills. No weight loss.  CV: Denies chest pain, palpitations or edema. Resp: Denies cough, shortness of breath of hemoptysis.  GI:See HPI. GU : Denies urinary burning, blood in urine, increased urinary frequency or incontinence. MS: Denies joint pain, muscles aches or weakness. Derm: Denies rash, itchiness, skin lesions or unhealing ulcers. Psych: Denies depression, anxiety, memory loss or confusion. Heme: Denies easy bruising, bleeding. Neuro: Denies headaches, dizziness or paresthesias. Endo:  + DM type II.  Physical Exam: Vital signs in last 24 hours: Temp:  [98.1  F (36.7 C)-98.5 F (36.9 C)] 98.4 F (36.9 C) (08/12 0236) Pulse Rate:  [82-110] 95 (08/12 0236) Resp:  [15-18] 18 (08/12 0236) BP: (124-180)/(76-120) 169/113 (08/12 0236) SpO2:  [90 %-100 %] 97 % (08/12 0236) Weight:  [45.4 kg] 45.4 kg (08/11 1403) Last BM Date : 03/08/24 General: Alert 89 year old female in no acute distress. Head:  Normocephalic and atraumatic. Eyes:  No scleral icterus. Conjunctiva pink. Ears:  Normal auditory acuity. Nose:  No deformity, discharge or lesions. Mouth:  Dentition intact. No ulcers or lesions.  Neck:  Supple. No lymphadenopathy or thyromegaly.  Lungs: Breath sounds clear throughout. No wheezes, rhonchi or crackles.  Heart: Irregular rhythm, no murmurs. Abdomen: Soft, nondistended.  Nontender.  Positive bowel sounds to all 4 quadrants.  No bruit.  No palpable mass. Rectal: Deferred. Musculoskeletal: Symmetrical without gross deformities.  Pulses: Normal pulses noted. Extremities: Without clubbing or edema. Neurologic: Alert and oriented x 4. No focal deficits.  Skin: Intact without significant lesions or rashes. Psych: Alert and cooperative. Normal mood and affect.  Intake/Output from previous day: No intake/output data recorded. Intake/Output this shift: No intake/output data recorded.  Lab Results: Recent Labs    03/08/24 1407  WBC 8.8  HGB 15.9*  HCT 48.6*  PLT 304   BMET Recent Labs    03/08/24 1407 03/09/24 0514  NA 141 141  K 4.2 3.8  CL 104 108  CO2 23 21*  GLUCOSE 187* 95  BUN 28* 20  CREATININE 0.88 0.75  CALCIUM 9.6 8.7*   LFT Recent Labs    03/09/24 0514  PROT 7.0  ALBUMIN  3.6  AST 25  ALT 9  ALKPHOS 69  BILITOT 2.1*   PT/INR No results for input(s): LABPROT, INR in the last 72 hours. Hepatitis Panel No results for input(s): HEPBSAG, HCVAB, HEPAIGM, HEPBIGM in the last 72 hours.    Studies/Results: CT Angio Chest/Abd/Pel for Dissection W and/or Wo Contrast Result Date:  03/08/2024 CLINICAL DATA:  Chest pain and vomiting, initial encounter EXAM: CT ANGIOGRAPHY CHEST, ABDOMEN AND PELVIS TECHNIQUE: Non-contrast CT of the chest was initially obtained. Multidetector CT imaging through the chest, abdomen and pelvis was performed using the standard protocol during bolus administration of intravenous contrast. Multiplanar reconstructed images and MIPs were obtained and reviewed to evaluate the vascular anatomy. RADIATION DOSE REDUCTION: This exam was performed according to the departmental dose-optimization program which includes automated exposure control, adjustment of the mA and/or kV according to patient size and/or use of iterative reconstruction technique. CONTRAST:  OMNIPAQUE  IOHEXOL  350 MG/ML SOLN COMPARISON:  08/07/2023 FINDINGS: CTA CHEST FINDINGS Cardiovascular: Precontrast images demonstrate atherosclerotic calcifications of the thoracic aorta. No aneurysmal dilatation is seen. No hyperdense crescent to suggest acute aortic injury is noted. Post-contrast images show the left vertebral artery to arise directly from the aorta. No evidence of  dissection is noted. No aneurysmal dilatation is seen. Heart is mildly enlarged in size. The pulmonary artery shows a normal branching pattern without intraluminal filling defect to suggest pulmonary embolism. Mediastinum/Nodes: Thoracic inlet is within normal limits. No hilar or mediastinal adenopathy is noted. The esophagus is dilated throughout its course and filled with fluid. A radiopaque density is noted at the gastroesophageal junction likely representing a an ingested tablet. This may also be related to a degree of achalasia. Lungs/Pleura: Lungs are well aerated bilaterally. Emphysematous changes are seen. Diffuse subpleural fibrotic changes are noted bilaterally. Few scattered tiny parenchymal nodules are noted measuring less than 5 mm. No specific follow-up is recommended. No focal confluent infiltrate or effusion is seen.  Musculoskeletal: Degenerative changes of the thoracic spine are seen. Chronic appearing T12 compression deformity is noted. Review of the MIP images confirms the above findings. CTA ABDOMEN AND PELVIS FINDINGS VASCULAR Aorta: Atherosclerotic calcifications are noted without aneurysmal dilatation. Celiac: Patent without evidence of aneurysm, dissection, vasculitis or significant stenosis. SMA: Patent without evidence of aneurysm, dissection, vasculitis or significant stenosis. Renals: Single renal artery is noted on the right. The left artery is not appreciated due to prior left nephrectomy. IMA: Within normal limits. Inflow: Iliacs demonstrate atherosclerotic calcification without acute abnormality. Veins: No specific venous abnormality is noted. Review of the MIP images confirms the above findings. NON-VASCULAR Hepatobiliary: No focal liver abnormality is seen. No gallstones, gallbladder wall thickening, or biliary dilatation. Pancreas: Unremarkable. No pancreatic ductal dilatation or surrounding inflammatory changes. Spleen: Normal in size without focal abnormality. Adrenals/Urinary Tract: Adrenal glands are within normal limits. Left kidney has been surgically removed. Right kidney is well visualized and within normal limits. The bladder is well distended. Stomach/Bowel: Scattered diverticular change of the colon is noted without evidence of diverticulitis. No obstructive changes of the colon are seen. Fecal material is noted throughout without obstructive change. The appendix is within normal limits. Small bowel and stomach are unremarkable. Lymphatic: No lymphadenopathy is noted. Reproductive: Uterus and bilateral adnexa are unremarkable. Other: No abdominal wall hernia or abnormality. No abdominopelvic ascites. Musculoskeletal: Right hip replacement is noted. No acute bony abnormality is seen. Review of the MIP images confirms the above findings. IMPRESSION: CTA of the chest: No evidence of aortic injury. No  aneurysmal dilatation or dissection is noted. Esophageal dilatation with a radiopaque density just above the gastroesophageal junction which may represent a retained pill. Considerable amount of fluid is noted within the esophagus which corresponds with the given clinical history. This may represent a degree of achalasia. Scattered small less than 5 mm parenchymal nodules. No specific follow-up is recommended. CTA of the abdomen and pelvis: No acute aortic abnormality is noted. Diverticulosis without diverticulitis. No acute abnormality noted. Electronically Signed   By: Oneil Devonshire M.D.   On: 03/08/2024 19:48    IMPRESSION/PLAN:  89 year old female admitted 03/08/2024 with N/V, vague dysphagia and atypical CP x 1 week.  Emesis described as nonbloody clear to white foam. CTA showed a dilated esophagus with a radiopaque density above the esophageal junction possibly representing a retained pill with considerable amount of fluid in the esophagus concerning for achalasia. - NPO - IV fluids per the hospitalist  - Ondansetron  4 mg PR IV every 6 hours as needed - Patient does not wish to pursue endoscopic evaluation with sedation  - Consider barium swallow study with tablet, await further recommendations per Dr. Stacia  Elevated T. Bili with normal Alk phos, AST/ALT levels, likely secondary to vomiting.  Abdominal/pelvic CTA showed a normal liver and gallbladder without biliary ductal dilatation. -Direct bili, add to earlier am lab draw   Chronic constipation  -Miralax  Q HS -Recommend outpatient follow up  History of hyperplastic colon polyps  DM type II  COPD   History or renal cell carcinoma s/p left nephrectomy   Remote history of PE, not on [facility]  Alexa [name]  03/09/2024, 9:13 AM

## 2024-03-09 NOTE — Hospital Course (Addendum)
 Alexa [name] is a 89 y.o. female with medical history significant of asthma/COPD, angioedema, GERD, IBS, ischemic colitis, renal carcinoma status post left nephrectomy in 2008, hyperlipidemia, history of VTE, type 2 diabetes, hypertension, anxiety presenting with complaints of chest pain, nausea, and vomiting.    Patient states sometimes after she eats food gets stuck in her throat.  She took a pill in the morning (does not remember which medication) and afterwards went to the kitchen to have breakfast.  She had a few sips of coffee and ate oatmeal and afterwards started having substernal chest pain and vomiting.  She had multiple episodes of vomiting throughout the day and was not able to tolerate p.o. intake so she came into the ED to be evaluated.  Denies any further episodes of vomiting since arrival to the hospital.  No longer having chest pain.  Denies abdominal pain.  No other complaints.  She never had an EGD done in the past.  CT angio of the chest was performed which was negative for aortic injury or aneurysmal dilation or dissection.  Showed esophageal dilation with a radiopaque density just above the GE junction which may have represented a retained pill.  There was also fluid noted within the esophagus and possibly representing achalasia or other abnormality.  Case was discussed with GI and she was recommended for admission and further evaluation with possible EGD.   Assessment and Plan  Noncardiac chest pain Nausea/vomiting Dysphagia - Troponin negative x 2, not consistent with ACS.   - Symptoms started after she took a pill and developed difficulty swallowing and epigastric pain; given chronic issue with acute worsening, may have progression of underlying esophageal stenosis versus mass versus achalasia versus other etiology -Barium swallow performed 03/09/2024 showing moderate esophageal dysmotility and a probable distal esophageal stricture but not optimally evaluated - Patient  initially declining EGD on 03/09/2024 but now amenable on 03/10/2024 -Went EGD and found to have intrinsic moderate stenosis at the GE junction treated with dilation; also noted to have a few nonbleeding superficial gastric ulcers near the gastric antrum - She is recommended for 8 weeks Protonix  twice daily followed by Protonix  daily indefinitely - Tolerated soft diet well prior to discharge   Incidental pulmonary nodules CT showing scattered small less than 5 mm parenchymal nodules.  - No specific follow-up indicated.   Type 2 diabetes A1c 6.1 in January 2025 - A1c 6.5% on 03/09/2024   Asthma/COPD: Stable, no signs of acute exacerbation. GERD -continue Protonix  Hyperlipidemia -no statin noted on med rec Hypertension: PRN meds Anxiety - resume hydroxyzine  at discharge

## 2024-03-09 NOTE — Care Management Obs Status (Signed)
 MEDICARE OBSERVATION STATUS NOTIFICATION   Patient Details  Name: Alexa [name] MRN: [id number] Date of Birth: Feb 17, 1930   Medicare Observation Status Notification Given:  Yes    Toy LITTIE Agar, RN 03/09/2024, 3:49 PM

## 2024-03-09 NOTE — Plan of Care (Signed)

## 2024-03-09 NOTE — Progress Notes (Signed)
   03/09/24 1606  TOC Brief Assessment  Insurance and Status Reviewed  Patient has primary care physician Yes Stevenson, [place], DO)  Home environment has been reviewed From home  Prior level of function: Independent  Prior/Current Home Services No current home services  Social Drivers of Health Review SDOH reviewed no interventions necessary  Readmission risk has been reviewed Yes  Transition of care needs no transition of care needs at this time

## 2024-03-09 NOTE — Consult Note (Signed)
 Referring Provider: Dr. Editha Ram Primary Care Physician:  Valentin Skates, DO Primary Gastroenterologist:  Dr.Gessner  Reason for Consultation:  Atypical chest pain, dysphagia   HPI: Alexa [name] is a 89 y.o. female with a past medical history of hypertension, asthma, COPD, DM type II, renal cell carcinoma s/p left nephrectomy 2008, PE 07/2007, diverticulosis, ischemic colitis and hyperplastic colon polyps.   She developed N/V, felt like pills were stuck in her esophagus after drinking 2 sips of coffee and one bite of instant oatmeal. She presented to the ED 03/08/2024 via EMS for further evaluation.  Labs in the ED showed a WBC count of 8.8.  Hemoglobin 15.9.  Hematocrit 48.6.  Platelets 304.  BUN 28.  Creatinine 0.88.  Total bili 1.7.  Alk phos 72.  AST 20.  ALT 16.  Lipase 56.  BNP 103.2.  Troponin 14  and 16.  Chest/abdominal/pelvic CTA was negative aortic dissection and showed a dilated esophagus above true esophageal junction possibly representing a retained pill with considerable amount of fluid in the esophagus possibly representing achalasia and diverticulosis without diverticulitis.  She received Metoclopramide  10mg  IV and Ondansetron  4mg  IV and her symptoms improved.   Labs today: BUN 20.  Creatinine 0.75.  Total bili 2.1.  Alk phos 69.  AST 25.  ALT 9.  A GI consult was requested for further evaluation, consideration for EGD.  She endorses having a runny nose, cough and chest heaviness x 1 week. She drank 2 sips of coffee and one bite of oatmeal yesterday morning, couldn't swallow the oatmeal and spit it out then started vomiting up clear to white foam. She also felt like her am BP med and bone supplement pills were stuck in her throat. No coffee ground emesis or frank red hematemesis. She noted for the past week, she felt a vague difficulty swallowing, something just didn't feel right, food did not get stuck in her throat or esophagus. She continues to spit up thick phlegm  overnight and this morning without further vomiting. She has infrequent heartburn which typical occurs if she eats acidic foods such as spaghetti with tomato sauce relieved by drinking soda pop. She denies having any upper or lower abdominal pain. Never had an EGD. She takes ASA 81 mg once daily. Denies other NSAID use. No alcohol  use. Non-smoker. She has chronic constipation, passes small balls of stool most days after using a Dulcolax suppository.  She takes a stool softener and MiraLAX  as needed.  No bloody or black stools.   ECHO 08/20/2023:  Left ventricular ejection fraction, by estimation, is 50 to 55%. The left ventricle has low normal function. The left ventricle has no regional wall motion abnormalities. Left ventricular diastolic parameters are consistent with Grade I diastolic dysfunction (impaired relaxation). The average left ventricular global longitudinal strain is -16.0 %. The global longitudinal strain is abnormal. 1. Right ventricular systolic function is normal. The right ventricular size is normal. There is normal pulmonary artery systolic pressure. The estimated right ventricular systolic pressure is 24.5 mmHg. 2. 3. Trivial mitral valve regurgitation. 4. The aortic valve is tricuspid. Aortic valve regurgitation is mild. 5. Aneurysm of the aortic root, measuring 49 mm. The inferior vena cava is normal in size with greater than 50% respiratory variability, suggesting right atrial pressure of 3 mmHg.   GI PROCEDURES:   Colonoscopy 06/05/2005:  Multiple hyperplastic polyps removed from the sigmoid and rectum  Diverticulosis  Internal hemorrhoids   Colonoscopy 09/13/1997:  3 small polyps removed from  the right colon  3 small polyps removed from the sigmoid  Past Medical History:  Diagnosis Date   Angio-edema    Asthma    Chronic obstructive pulmonary disease (HCC)    Diverticulosis of colon (without mention of hemorrhage)    Eczema    Esophageal reflux    History of colonic  polyps 06/05/2005   hyperplastic   Internal hemorrhoids    Irritable bowel syndrome    Ischemic colitis (HCC)    Malignant neoplasm of kidney and other and unspecified urinary organs    renal carcinoma, left nephrectomy in 2008-per patient.   Other and unspecified hyperlipidemia    Personal history of venous thrombosis and embolism    PTE after nephrectomy   Spinal stenosis    Type II or unspecified type diabetes mellitus without mention of complication, not stated as uncontrolled 02/05/2012   pt states it was in 2007   Unspecified essential hypertension    Urticaria     Past Surgical History:  Procedure Laterality Date   ADENOIDECTOMY     BREAST BIOPSY     COLONOSCOPY W/ POLYPECTOMY  2000   COLONOSCOPY W/ POLYPECTOMY  07/2004   Hyperplastic polyps   DILATION AND CURETTAGE OF UTERUS  03/2005   Uterine Polyps   NEPHRECTOMY  2007   right,Dr Dahlstedt   SEPTOPLASTY     TONSILLECTOMY     TOTAL HIP ARTHROPLASTY Right 12/16/2021   Procedure: TOTAL HIP ARTHROPLASTY ANTERIOR APPROACH;  Surgeon: Fidel Rogue, MD;  Location: [facility];  Service: Orthopedics;  Laterality: Right;    Prior to Admission medications   Medication Sig Start Date End Date Taking? Authorizing Provider  Accu-Chek Softclix Lancets lancets Use as directed 2-4 times daily. 10/03/22   Valentin Skates, DO  acetaminophen  (TYLENOL ) 500 MG tablet Take 500 mg by mouth 2 (two) times daily.    [provider]  acetaminophen  (TYLENOL ) 650 MG CR tablet Take 1 tablet (650 mg total) by mouth every 6 (six) hours as needed for pain. Do NOT chew or crush) 04/10/23     albuterol  (VENTOLIN  HFA) 108 (90 Base) MCG/ACT inhaler Inhale 2 puffs into the lungs every 6 (six) hours as needed for wheezing or shortness of breath.    [provider]  albuterol  (VENTOLIN  HFA) 108 (90 Base) MCG/ACT inhaler Inhale 1 puff into the lungs every 4 (four) hours as needed. 07/24/23   Valentin Skates, DO  amLODipine  (NORVASC ) 5 MG tablet  Take 2 tablets (10 mg total) by mouth daily. Patient taking differently: Take 5 mg by mouth in the morning. 02/17/23   Trixie Nilda HERO, MD  amLODipine  (NORVASC ) 5 MG tablet Take 1 tablet (5 mg total) by mouth daily. 06/23/23   Valentin Skates, DO  amLODipine  (NORVASC ) 5 MG tablet Take 1 tablet (5 mg total) by mouth daily. 07/24/23   Valentin Skates, DO  amLODipine  (NORVASC ) 5 MG tablet Take 1 tablet (5 mg total) by mouth daily. 11/11/22     aspirin  EC 81 MG tablet Take 1 tablet (81 mg total) by mouth daily. Swallow whole. 08/08/23   Shalhoub, Zachary PARAS, MD  Benzalkonium Chloride (DIABETIC BASICS HEALTHY FOOT EX) Apply 1 application  topically See admin instructions. Apply to the feet at bedtime    [provider]  Blood Glucose Monitoring Suppl (ACCU-CHEK GUIDE) w/Device KIT Use as directed 10/03/22   Valentin Skates, DO  cholecalciferol  (VITAMIN D3) 25 MCG (1000 UNIT) tablet Take 1,000 Units by mouth daily.    [provider]  CRANBERRY PO Take 2 tablets by mouth 2 (two) times daily.    [provider]  ESTRACE  VAGINAL 0.1 MG/GM vaginal cream See admin instructions. Apply locally every other night at bedtime 01/16/16   [provider]  estradiol  (ESTRACE  VAGINAL) 0.1 MG/GM vaginal cream Apply vaginally Vaginal 2-3 times per week to help with urinary symptoms 09/11/23     estradiol  (ESTRACE ) 0.1 MG/GM vaginal cream Apply vaginally 2 to 3 times per week to help with urinary symptoms 09/09/23   Valentin Skates, DO  ferrous sulfate  325 (65 FE) MG tablet Take 1 tablet (325 mg total) by mouth daily. 07/24/23   Valentin Skates, DO  fluticasone  (FLONASE ) 50 MCG/ACT nasal spray Place 1 spray into both nostrils in the morning and at bedtime.    [provider]  fluticasone  (FLONASE ) 50 MCG/ACT nasal spray Shake liquid and place 1 spray into both nostrils daily. 09/08/23   Valentin Skates, DO  glucose blood test strip Use as directed once daily. 10/03/22   Valentin Skates, DO   HYDROcodone -acetaminophen  (NORCO/VICODIN) 5-325 MG tablet Take 1 tablet by mouth See admin instructions. Take 1 tablet by mouth in the morning and 1 tablet between midnight and morning    [provider]  hydrOXYzine  (ATARAX ) 10 MG tablet Take 10 mg by mouth daily as needed for anxiety. 11/13/21   [provider]  hydrOXYzine  (ATARAX ) 10 MG tablet Take 1 tablet (10 mg total) by mouth daily as needed. 05/02/23   Valentin Skates, DO  hydrOXYzine  (ATARAX ) 10 MG tablet Take 1 tablet (10 mg total) by mouth daily as needed for anxiety. 04/10/23     ibuprofen  (ADVIL ) 600 MG tablet Take 1 tablet (600 mg total) by mouth every 6 (six) hours as needed for pain 08/25/23     linaclotide  (LINZESS ) 290 MCG CAPS capsule Take 1 capsule (290 mcg total) by mouth in the morning at least 30 minutes before the first meal on an empty stomach. 10/22/22   Yolande Toribio MATSU, MD  linaclotide  (LINZESS ) 290 MCG CAPS capsule Take 1 capsule (290 mcg total) by mouth in the morning at least 30 minutes before the first meal of the day on an emtpy stomach. 10/22/22   Yolande Toribio MATSU, MD  metFORMIN  (GLUCOPHAGE ) 500 MG tablet Take 1 tablet (500 mg total) by mouth daily with a meal. 05/07/23   Valentin Skates, DO  metoprolol  tartrate (LOPRESSOR ) 25 MG tablet Take 0.5 tablets (12.5 mg total) by mouth 2 (two) times daily. 08/08/23 08/07/24  Shalhoub, Zachary PARAS, MD  metoprolol  tartrate (LOPRESSOR ) 25 MG tablet Take 0.5 tablets (12.5 mg total) by mouth 2 (two) times daily. 08/08/23   Shalhoub, Zachary PARAS, MD  Misc Natural Products (RED WINE COMPLEX PO) Take 1 capsule by mouth daily.    [provider]  Multiple Vitamins-Minerals (PRESERVISION AREDS 2) CAPS Take 1 capsule by mouth 2 (two) times daily after a meal.    [provider]  sitaGLIPtin  (JANUVIA ) 50 MG tablet Take 50 mg by mouth daily as needed (for elevated BGL).    [provider]  SYSTANE COMPLETE PF 0.6 % SOLN Place 1 drop into both eyes 3 (three)  times daily as needed (for irritation).    [provider]    Current Facility-Administered Medications  Medication Dose Route Frequency Provider Last Rate Last Admin   0.9 %  sodium chloride  infusion   Intravenous Continuous Alfornia Madison, MD 75 mL/hr at 03/09/24 0353 New Bag at 03/09/24 [phone]  insulin  aspart (novoLOG ) injection 0-9 Units  0-9 Units Subcutaneous Q4H Rathore, Vasundhra, MD   1 Units at 03/09/24 9647   labetalol  (NORMODYNE ) injection 5 mg  5 mg Intravenous Q2H PRN Alfornia Madison, MD   5 mg at 03/09/24 9666    Allergies as of 03/08/2024 - Review Complete 03/08/2024  Allergen Reaction Noted   Lisinopril  Other (See Comments) and Cough 02/14/2015   Codeine  Nausea Only 03/02/2007   Hydralazine  Anxiety 08/07/2023   Verapamil Other (See Comments) 01/06/2012    Family History  Problem Relation Age of Onset   Stroke Mother        onset in 9s   Diabetes Mother    Cancer Mother        bladder; nephrectomy for calculi/also uterine , TAH & BSO   Aneurysm Mother        thoracic   Depression Mother    Lung cancer Father        smoked   Heart failure Sister    Depression Sister    Alzheimer's disease Sister    COPD Sister    Bipolar disorder Sister    Stroke Brother    Heart failure Brother    Aneurysm Brother        cns   Heart failure Maternal Grandfather    Depression Maternal Aunt        X2   Heart attack Other        maternal family history   Alcoholism Neg Hx     Social History   Socioeconomic History   Marital status: Widowed    Spouse name: Not on file   Number of children: 2   Years of education: Not on file   Highest education level: Not on file  Occupational History   Not on file  Tobacco Use   Smoking status: Never   Smokeless tobacco: Never  Vaping Use   Vaping status: Never Used  Substance and Sexual Activity   Alcohol  use: No   Drug use: No   Sexual activity: Not on file  Other Topics Concern   Not on file  Social  History Narrative   ** Merged History Encounter **       Social Drivers of Health   Financial Resource Strain: Low Risk  (08/08/2020)   Overall Financial Resource Strain (CARDIA)    Difficulty of Paying Living Expenses: Not hard at all  Food Insecurity: No Food Insecurity (08/07/2023)   Hunger Vital Sign    Worried About Running Out of Food in the Last Year: Never true    Ran Out of Food in the Last Year: Never true  Transportation Needs: No Transportation Needs (08/07/2023)   PRAPARE - Administrator, Civil Service (Medical): No    Lack of Transportation (Non-Medical): No  Physical Activity: Not on file  Stress: Not on file  Social Connections: Socially Isolated (08/07/2023)   Social Connection and Isolation Panel    Frequency of Communication with Friends and Family: More than three times a week    Frequency of Social Gatherings with Friends and Family: More than three times a week    Attends Religious Services: Never    Database administrator or Organizations: No    Attends Banker Meetings: Never    Marital Status: Widowed  Intimate Partner Violence: Not At Risk (08/07/2023)   Humiliation, Afraid, Rape, and Kick questionnaire    Fear of Current or Ex-Partner: No    Emotionally Abused:  No    Physically Abused: No    Sexually Abused: No    Review of Systems: Gen: Denies fever, sweats or chills. No weight loss.  CV: Denies chest pain, palpitations or edema. Resp: Denies cough, shortness of breath of hemoptysis.  GI:See HPI. GU : Denies urinary burning, blood in urine, increased urinary frequency or incontinence. MS: Denies joint pain, muscles aches or weakness. Derm: Denies rash, itchiness, skin lesions or unhealing ulcers. Psych: Denies depression, anxiety, memory loss or confusion. Heme: Denies easy bruising, bleeding. Neuro: Denies headaches, dizziness or paresthesias. Endo:  + DM type II.  Physical Exam: Vital signs in last 24 hours: Temp:  [98.1  F (36.7 C)-98.5 F (36.9 C)] 98.4 F (36.9 C) (08/12 0236) Pulse Rate:  [82-110] 95 (08/12 0236) Resp:  [15-18] 18 (08/12 0236) BP: (124-180)/(76-120) 169/113 (08/12 0236) SpO2:  [90 %-100 %] 97 % (08/12 0236) Weight:  [45.4 kg] 45.4 kg (08/11 1403) Last BM Date : 03/08/24 General: Alert 89 year old female in no acute distress. Head:  Normocephalic and atraumatic. Eyes:  No scleral icterus. Conjunctiva pink. Ears:  Normal auditory acuity. Nose:  No deformity, discharge or lesions. Mouth:  Dentition intact. No ulcers or lesions.  Neck:  Supple. No lymphadenopathy or thyromegaly.  Lungs: Breath sounds clear throughout. No wheezes, rhonchi or crackles.  Heart: Irregular rhythm, no murmurs. Abdomen: Soft, nondistended.  Nontender.  Positive bowel sounds to all 4 quadrants.  No bruit.  No palpable mass. Rectal: Deferred. Musculoskeletal: Symmetrical without gross deformities.  Pulses: Normal pulses noted. Extremities: Without clubbing or edema. Neurologic: Alert and oriented x 4. No focal deficits.  Skin: Intact without significant lesions or rashes. Psych: Alert and cooperative. Normal mood and affect.  Intake/Output from previous day: No intake/output data recorded. Intake/Output this shift: No intake/output data recorded.  Lab Results: Recent Labs    03/08/24 1407  WBC 8.8  HGB 15.9*  HCT 48.6*  PLT 304   BMET Recent Labs    03/08/24 1407 03/09/24 0514  NA 141 141  K 4.2 3.8  CL 104 108  CO2 23 21*  GLUCOSE 187* 95  BUN 28* 20  CREATININE 0.88 0.75  CALCIUM 9.6 8.7*   LFT Recent Labs    03/09/24 0514  PROT 7.0  ALBUMIN  3.6  AST 25  ALT 9  ALKPHOS 69  BILITOT 2.1*   PT/INR No results for input(s): LABPROT, INR in the last 72 hours. Hepatitis Panel No results for input(s): HEPBSAG, HCVAB, HEPAIGM, HEPBIGM in the last 72 hours.    Studies/Results: CT Angio Chest/Abd/Pel for Dissection W and/or Wo Contrast Result Date:  03/08/2024 CLINICAL DATA:  Chest pain and vomiting, initial encounter EXAM: CT ANGIOGRAPHY CHEST, ABDOMEN AND PELVIS TECHNIQUE: Non-contrast CT of the chest was initially obtained. Multidetector CT imaging through the chest, abdomen and pelvis was performed using the standard protocol during bolus administration of intravenous contrast. Multiplanar reconstructed images and MIPs were obtained and reviewed to evaluate the vascular anatomy. RADIATION DOSE REDUCTION: This exam was performed according to the departmental dose-optimization program which includes automated exposure control, adjustment of the mA and/or kV according to patient size and/or use of iterative reconstruction technique. CONTRAST:  OMNIPAQUE  IOHEXOL  350 MG/ML SOLN COMPARISON:  08/07/2023 FINDINGS: CTA CHEST FINDINGS Cardiovascular: Precontrast images demonstrate atherosclerotic calcifications of the thoracic aorta. No aneurysmal dilatation is seen. No hyperdense crescent to suggest acute aortic injury is noted. Post-contrast images show the left vertebral artery to arise directly from the aorta. No evidence of  dissection is noted. No aneurysmal dilatation is seen. Heart is mildly enlarged in size. The pulmonary artery shows a normal branching pattern without intraluminal filling defect to suggest pulmonary embolism. Mediastinum/Nodes: Thoracic inlet is within normal limits. No hilar or mediastinal adenopathy is noted. The esophagus is dilated throughout its course and filled with fluid. A radiopaque density is noted at the gastroesophageal junction likely representing a an ingested tablet. This may also be related to a degree of achalasia. Lungs/Pleura: Lungs are well aerated bilaterally. Emphysematous changes are seen. Diffuse subpleural fibrotic changes are noted bilaterally. Few scattered tiny parenchymal nodules are noted measuring less than 5 mm. No specific follow-up is recommended. No focal confluent infiltrate or effusion is seen.  Musculoskeletal: Degenerative changes of the thoracic spine are seen. Chronic appearing T12 compression deformity is noted. Review of the MIP images confirms the above findings. CTA ABDOMEN AND PELVIS FINDINGS VASCULAR Aorta: Atherosclerotic calcifications are noted without aneurysmal dilatation. Celiac: Patent without evidence of aneurysm, dissection, vasculitis or significant stenosis. SMA: Patent without evidence of aneurysm, dissection, vasculitis or significant stenosis. Renals: Single renal artery is noted on the right. The left artery is not appreciated due to prior left nephrectomy. IMA: Within normal limits. Inflow: Iliacs demonstrate atherosclerotic calcification without acute abnormality. Veins: No specific venous abnormality is noted. Review of the MIP images confirms the above findings. NON-VASCULAR Hepatobiliary: No focal liver abnormality is seen. No gallstones, gallbladder wall thickening, or biliary dilatation. Pancreas: Unremarkable. No pancreatic ductal dilatation or surrounding inflammatory changes. Spleen: Normal in size without focal abnormality. Adrenals/Urinary Tract: Adrenal glands are within normal limits. Left kidney has been surgically removed. Right kidney is well visualized and within normal limits. The bladder is well distended. Stomach/Bowel: Scattered diverticular change of the colon is noted without evidence of diverticulitis. No obstructive changes of the colon are seen. Fecal material is noted throughout without obstructive change. The appendix is within normal limits. Small bowel and stomach are unremarkable. Lymphatic: No lymphadenopathy is noted. Reproductive: Uterus and bilateral adnexa are unremarkable. Other: No abdominal wall hernia or abnormality. No abdominopelvic ascites. Musculoskeletal: Right hip replacement is noted. No acute bony abnormality is seen. Review of the MIP images confirms the above findings. IMPRESSION: CTA of the chest: No evidence of aortic injury. No  aneurysmal dilatation or dissection is noted. Esophageal dilatation with a radiopaque density just above the gastroesophageal junction which may represent a retained pill. Considerable amount of fluid is noted within the esophagus which corresponds with the given clinical history. This may represent a degree of achalasia. Scattered small less than 5 mm parenchymal nodules. No specific follow-up is recommended. CTA of the abdomen and pelvis: No acute aortic abnormality is noted. Diverticulosis without diverticulitis. No acute abnormality noted. Electronically Signed   By: Oneil Devonshire M.D.   On: 03/08/2024 19:48    IMPRESSION/PLAN:  89 year old female admitted 03/08/2024 with N/V, vague dysphagia and atypical CP x 1 week.  Emesis described as nonbloody clear to white foam. CTA showed a dilated esophagus with a radiopaque density above the esophageal junction possibly representing a retained pill with considerable amount of fluid in the esophagus concerning for achalasia. - NPO - IV fluids per the hospitalist  - Ondansetron  4 mg PR IV every 6 hours as needed - Patient does not wish to pursue endoscopic evaluation with sedation  - Consider barium swallow study with tablet, await further recommendations per Dr. Stacia  Elevated T. Bili with normal Alk phos, AST/ALT levels, likely secondary to vomiting.  Abdominal/pelvic CTA showed a normal liver and gallbladder without biliary ductal dilatation. -Direct bili, add to earlier am lab draw   Chronic constipation  -Miralax  Q HS -Recommend outpatient follow up  History of hyperplastic colon polyps  DM type II  COPD   History or renal cell carcinoma s/p left nephrectomy   Remote history of PE, not on [facility]  Makenah Karas M Kennedy-Smith  03/09/2024, 9:13 AM

## 2024-03-09 NOTE — H&P (Signed)
 History and Physical    Alexa [name] FMW:[id number] DOB: [birth year] DOA: 03/08/2024  PCP: Valentin Skates, DO  Patient coming from: Home  Chief Complaint: Chest pain  HPI: Alexa [name] is a 89 y.o. female with medical history significant of asthma/COPD, angioedema, GERD, IBS, ischemic colitis, renal carcinoma status post left nephrectomy in 2008, hyperlipidemia, history of VTE, type 2 diabetes, hypertension, anxiety presenting with complaints of chest pain, nausea, and vomiting.  Patient states sometimes after she eats food gets stuck in her throat.  Yesterday she took a pill in the morning (does not remember which medication) and afterwards went to the kitchen to have breakfast.  She had a few sips of coffee and ate oatmeal and afterwards started having substernal chest pain and vomiting.  She had multiple episodes of vomiting throughout the day and was not able to tolerate p.o. intake so she came into the ED to be evaluated.  Denies any further episodes of vomiting since arrival to the hospital.  No longer having chest pain.  Denies abdominal pain.  No other complaints.  She never had an EGD done in the past.  ED Course: Hypertensive on arrival but remainder of vital signs stable.  Labs showing no leukocytosis, hemoglobin 15.9 (baseline 14-15 range), glucose 187, BUN 28, creatinine 0.88, T. bili 1.7, transaminases and alkaline phosphatase normal, lipase 56, UA pending, BNP 103, troponin negative x 2.  CT angiogram chest/abdomen/pelvis showing esophageal dilation with a radiopaque density just above the gastroesophageal junction which may represent a retained pill.  Considerable amount of fluid is noted within the esophagus and findings felt to represent a degree of achalasia.  Patient was given IV Reglan , IV Zofran , and 1 L IV fluids.  ED physician discussed the case with Dr. Wilhelmenia with [facility] who recommended admission for observation and keeping n.p.o. after midnight for possible EGD in the  morning.  Review of Systems:  Review of Systems  All other systems reviewed and are negative.   Past Medical History:  Diagnosis Date   Angio-edema    Asthma    Chronic obstructive pulmonary disease (HCC)    Diverticulosis of colon (without mention of hemorrhage)    Eczema    Esophageal reflux    History of colonic polyps 06/05/2005   hyperplastic   Internal hemorrhoids    Irritable bowel syndrome    Ischemic colitis (HCC)    Malignant neoplasm of kidney and other and unspecified urinary organs    renal carcinoma, left nephrectomy in 2008-per patient.   Other and unspecified hyperlipidemia    Personal history of venous thrombosis and embolism    PTE after nephrectomy   Spinal stenosis    Type II or unspecified type diabetes mellitus without mention of complication, not stated as uncontrolled 02/05/2012   pt states it was in 2007   Unspecified essential hypertension    Urticaria     Past Surgical History:  Procedure Laterality Date   ADENOIDECTOMY     BREAST BIOPSY     COLONOSCOPY W/ POLYPECTOMY  2000   COLONOSCOPY W/ POLYPECTOMY  07/2004   Hyperplastic polyps   DILATION AND CURETTAGE OF UTERUS  03/2005   Uterine Polyps   NEPHRECTOMY  2007   right,Dr Dahlstedt   SEPTOPLASTY     TONSILLECTOMY     TOTAL HIP ARTHROPLASTY Right 12/16/2021   Procedure: TOTAL HIP ARTHROPLASTY ANTERIOR APPROACH;  Surgeon: Fidel Rogue, MD;  Location: [facility];  Service: Orthopedics;  Laterality: Right;  reports that she has never smoked. She has never used smokeless tobacco. She reports that she does not drink alcohol  and does not use drugs.  Allergies  Allergen Reactions   Lisinopril  Other (See Comments) and Cough    Discontinued by MD Darlean   Codeine  Nausea Only   Hydralazine  Anxiety    Patient get anxious, flushed and tachycardic   Verapamil Other (See Comments)    Pain in feet    Family History  Problem Relation Age of Onset   Stroke Mother        onset in 32s    Diabetes Mother    Cancer Mother        bladder; nephrectomy for calculi/also uterine , TAH & BSO   Aneurysm Mother        thoracic   Depression Mother    Lung cancer Father        smoked   Heart failure Sister    Depression Sister    Alzheimer's disease Sister    COPD Sister    Bipolar disorder Sister    Stroke Brother    Heart failure Brother    Aneurysm Brother        cns   Heart failure Maternal Grandfather    Depression Maternal Aunt        X2   Heart attack Other        maternal family history   Alcoholism Neg Hx     Prior to Admission medications   Medication Sig Start Date End Date Taking? Authorizing Provider  Accu-Chek Softclix Lancets lancets Use as directed 2-4 times daily. 10/03/22   Valentin Skates, DO  acetaminophen  (TYLENOL ) 500 MG tablet Take 500 mg by mouth 2 (two) times daily.    [provider]  acetaminophen  (TYLENOL ) 650 MG CR tablet Take 1 tablet (650 mg total) by mouth every 6 (six) hours as needed for pain. Do NOT chew or crush) 04/10/23     albuterol  (VENTOLIN  HFA) 108 (90 Base) MCG/ACT inhaler Inhale 2 puffs into the lungs every 6 (six) hours as needed for wheezing or shortness of breath.    [provider]  albuterol  (VENTOLIN  HFA) 108 (90 Base) MCG/ACT inhaler Inhale 1 puff into the lungs every 4 (four) hours as needed. 07/24/23   Valentin Skates, DO  amLODipine  (NORVASC ) 5 MG tablet Take 2 tablets (10 mg total) by mouth daily. Patient taking differently: Take 5 mg by mouth in the morning. 02/17/23   Trixie Nilda HERO, MD  amLODipine  (NORVASC ) 5 MG tablet Take 1 tablet (5 mg total) by mouth daily. 06/23/23   Valentin Skates, DO  amLODipine  (NORVASC ) 5 MG tablet Take 1 tablet (5 mg total) by mouth daily. 07/24/23   Valentin Skates, DO  amLODipine  (NORVASC ) 5 MG tablet Take 1 tablet (5 mg total) by mouth daily. 11/11/22     aspirin  EC 81 MG tablet Take 1 tablet (81 mg total) by mouth daily. Swallow whole. 08/08/23   Shalhoub, Zachary PARAS, MD   Benzalkonium Chloride (DIABETIC BASICS HEALTHY FOOT EX) Apply 1 application  topically See admin instructions. Apply to the feet at bedtime    [provider]  Blood Glucose Monitoring Suppl (ACCU-CHEK GUIDE) w/Device KIT Use as directed 10/03/22   Valentin Skates, DO  cholecalciferol  (VITAMIN D3) 25 MCG (1000 UNIT) tablet Take 1,000 Units by mouth daily.    [provider]  CRANBERRY PO Take 2 tablets by mouth 2 (two) times daily.    [provider]  ESTRACE  VAGINAL 0.1 MG/GM vaginal cream See admin instructions. Apply locally every other night at bedtime 01/16/16   [provider]  estradiol  (ESTRACE  VAGINAL) 0.1 MG/GM vaginal cream Apply vaginally Vaginal 2-3 times per week to help with urinary symptoms 09/11/23     estradiol  (ESTRACE ) 0.1 MG/GM vaginal cream Apply vaginally 2 to 3 times per week to help with urinary symptoms 09/09/23   Valentin Skates, DO  ferrous sulfate  325 (65 FE) MG tablet Take 1 tablet (325 mg total) by mouth daily. 07/24/23   Valentin Skates, DO  fluticasone  (FLONASE ) 50 MCG/ACT nasal spray Place 1 spray into both nostrils in the morning and at bedtime.    [provider]  fluticasone  (FLONASE ) 50 MCG/ACT nasal spray Shake liquid and place 1 spray into both nostrils daily. 09/08/23   Valentin Skates, DO  glucose blood test strip Use as directed once daily. 10/03/22   Valentin Skates, DO  HYDROcodone -acetaminophen  (NORCO/VICODIN) 5-325 MG tablet Take 1 tablet by mouth See admin instructions. Take 1 tablet by mouth in the morning and 1 tablet between midnight and morning    [provider]  hydrOXYzine  (ATARAX ) 10 MG tablet Take 10 mg by mouth daily as needed for anxiety. 11/13/21   [provider]  hydrOXYzine  (ATARAX ) 10 MG tablet Take 1 tablet (10 mg total) by mouth daily as needed. 05/02/23   Valentin Skates, DO  hydrOXYzine  (ATARAX ) 10 MG tablet Take 1 tablet (10 mg total) by mouth daily as needed for anxiety. 04/10/23      ibuprofen  (ADVIL ) 600 MG tablet Take 1 tablet (600 mg total) by mouth every 6 (six) hours as needed for pain 08/25/23     linaclotide  (LINZESS ) 290 MCG CAPS capsule Take 1 capsule (290 mcg total) by mouth in the morning at least 30 minutes before the first meal on an empty stomach. 10/22/22   Yolande Toribio MATSU, MD  linaclotide  (LINZESS ) 290 MCG CAPS capsule Take 1 capsule (290 mcg total) by mouth in the morning at least 30 minutes before the first meal of the day on an emtpy stomach. 10/22/22   Yolande Toribio MATSU, MD  metFORMIN  (GLUCOPHAGE ) 500 MG tablet Take 1 tablet (500 mg total) by mouth daily with a meal. 05/07/23   Valentin Skates, DO  metoprolol  tartrate (LOPRESSOR ) 25 MG tablet Take 0.5 tablets (12.5 mg total) by mouth 2 (two) times daily. 08/08/23 08/07/24  Shalhoub, Zachary PARAS, MD  metoprolol  tartrate (LOPRESSOR ) 25 MG tablet Take 0.5 tablets (12.5 mg total) by mouth 2 (two) times daily. 08/08/23   Shalhoub, Zachary PARAS, MD  Misc Natural Products (RED WINE COMPLEX PO) Take 1 capsule by mouth daily.    [provider]  Multiple Vitamins-Minerals (PRESERVISION AREDS 2) CAPS Take 1 capsule by mouth 2 (two) times daily after a meal.    [provider]  sitaGLIPtin  (JANUVIA ) 50 MG tablet Take 50 mg by mouth daily as needed (for elevated BGL).    [provider]  SYSTANE COMPLETE PF 0.6 % SOLN Place 1 drop into both eyes 3 (three) times daily as needed (for irritation).    [provider]    Physical Exam: Vitals:   03/08/24 2130 03/08/24 2150 03/08/24 2200 03/09/24 0236  BP: (!) 152/97  (!) 171/95 (!) 169/113  Pulse: 88 97 97 95  Resp:    18  Temp:    98.4 F (36.9 C)  TempSrc:    Oral  SpO2: 94% 94% 96% 97%  Weight:  Height:        Physical Exam Vitals reviewed.  Constitutional:      General: She is not in acute distress. HENT:     Head: Normocephalic and atraumatic.  Eyes:     Extraocular Movements: Extraocular movements intact.  Cardiovascular:      Rate and Rhythm: Normal rate and regular rhythm.     Pulses: Normal pulses.  Pulmonary:     Effort: Pulmonary effort is normal. No respiratory distress.     Breath sounds: Normal breath sounds. No wheezing or rales.  Abdominal:     General: Bowel sounds are normal. There is no distension.     Palpations: Abdomen is soft.     Tenderness: There is no abdominal tenderness. There is no guarding.  Musculoskeletal:     Cervical back: Normal range of motion.     Right lower leg: No edema.     Left lower leg: No edema.  Skin:    General: Skin is warm and dry.  Neurological:     General: No focal deficit present.     Mental Status: She is alert and oriented to person, place, and time.     Labs on Admission: I have personally reviewed following labs and imaging studies  CBC: Recent Labs  Lab 03/08/24 1407  WBC 8.8  HGB 15.9*  HCT 48.6*  MCV 91.7  PLT 304   Basic Metabolic Panel: Recent Labs  Lab 03/08/24 1407  NA 141  K 4.2  CL 104  CO2 23  GLUCOSE 187*  BUN 28*  CREATININE 0.88  CALCIUM 9.6   GFR: Estimated Creatinine Clearance: 29.2 mL/min (by C-G formula based on SCr of 0.88 mg/dL). Liver Function Tests: Recent Labs  Lab 03/08/24 1407  AST 20  ALT 16  ALKPHOS 72  BILITOT 1.7*  PROT 7.5  ALBUMIN  3.9   Recent Labs  Lab 03/08/24 1407  LIPASE 56*   No results for input(s): AMMONIA in the last 168 hours. Coagulation Profile: No results for input(s): INR, PROTIME in the last 168 hours. Cardiac Enzymes: No results for input(s): CKTOTAL, CKMB, CKMBINDEX, TROPONINI in the last 168 hours. BNP (last 3 results) No results for input(s): PROBNP in the last 8760 hours. HbA1C: No results for input(s): HGBA1C in the last 72 hours. CBG: No results for input(s): GLUCAP in the last 168 hours. Lipid Profile: No results for input(s): CHOL, HDL, LDLCALC, TRIG, CHOLHDL, LDLDIRECT in the last 72 hours. Thyroid  Function Tests: No  results for input(s): TSH, T4TOTAL, FREET4, T3FREE, THYROIDAB in the last 72 hours. Anemia Panel: No results for input(s): VITAMINB12, FOLATE, FERRITIN, TIBC, IRON, RETICCTPCT in the last 72 hours. Urine analysis:    Component Value Date/Time   COLORURINE STRAW (A) 08/07/2023 1538   APPEARANCEUR CLOUDY (A) 08/07/2023 1538   LABSPEC 1.006 08/07/2023 1538   PHURINE 7.0 08/07/2023 1538   GLUCOSEU NEGATIVE 08/07/2023 1538   HGBUR NEGATIVE 08/07/2023 1538   HGBUR negative 08/02/2009 1512   BILIRUBINUR NEGATIVE 08/07/2023 1538   BILIRUBINUR negative 08/25/2019 1154   BILIRUBINUR Negative 12/16/2017 1115   KETONESUR NEGATIVE 08/07/2023 1538   PROTEINUR NEGATIVE 08/07/2023 1538   UROBILINOGEN 0.2 08/25/2019 1154   UROBILINOGEN 0.2 12/12/2014 1623   NITRITE NEGATIVE 08/07/2023 1538   LEUKOCYTESUR LARGE (A) 08/07/2023 1538    Radiological Exams on Admission: CT Angio Chest/Abd/Pel for Dissection W and/or Wo Contrast Result Date: 03/08/2024 CLINICAL DATA:  Chest pain and vomiting, initial encounter EXAM: CT ANGIOGRAPHY CHEST, ABDOMEN AND PELVIS TECHNIQUE:  Non-contrast CT of the chest was initially obtained. Multidetector CT imaging through the chest, abdomen and pelvis was performed using the standard protocol during bolus administration of intravenous contrast. Multiplanar reconstructed images and MIPs were obtained and reviewed to evaluate the vascular anatomy. RADIATION DOSE REDUCTION: This exam was performed according to the departmental dose-optimization program which includes automated exposure control, adjustment of the mA and/or kV according to patient size and/or use of iterative reconstruction technique. CONTRAST:  OMNIPAQUE  IOHEXOL  350 MG/ML SOLN COMPARISON:  08/07/2023 FINDINGS: CTA CHEST FINDINGS Cardiovascular: Precontrast images demonstrate atherosclerotic calcifications of the thoracic aorta. No aneurysmal dilatation is seen. No hyperdense crescent to  suggest acute aortic injury is noted. Post-contrast images show the left vertebral artery to arise directly from the aorta. No evidence of dissection is noted. No aneurysmal dilatation is seen. Heart is mildly enlarged in size. The pulmonary artery shows a normal branching pattern without intraluminal filling defect to suggest pulmonary embolism. Mediastinum/Nodes: Thoracic inlet is within normal limits. No hilar or mediastinal adenopathy is noted. The esophagus is dilated throughout its course and filled with fluid. A radiopaque density is noted at the gastroesophageal junction likely representing a an ingested tablet. This may also be related to a degree of achalasia. Lungs/Pleura: Lungs are well aerated bilaterally. Emphysematous changes are seen. Diffuse subpleural fibrotic changes are noted bilaterally. Few scattered tiny parenchymal nodules are noted measuring less than 5 mm. No specific follow-up is recommended. No focal confluent infiltrate or effusion is seen. Musculoskeletal: Degenerative changes of the thoracic spine are seen. Chronic appearing T12 compression deformity is noted. Review of the MIP images confirms the above findings. CTA ABDOMEN AND PELVIS FINDINGS VASCULAR Aorta: Atherosclerotic calcifications are noted without aneurysmal dilatation. Celiac: Patent without evidence of aneurysm, dissection, vasculitis or significant stenosis. SMA: Patent without evidence of aneurysm, dissection, vasculitis or significant stenosis. Renals: Single renal artery is noted on the right. The left artery is not appreciated due to prior left nephrectomy. IMA: Within normal limits. Inflow: Iliacs demonstrate atherosclerotic calcification without acute abnormality. Veins: No specific venous abnormality is noted. Review of the MIP images confirms the above findings. NON-VASCULAR Hepatobiliary: No focal liver abnormality is seen. No gallstones, gallbladder wall thickening, or biliary dilatation. Pancreas: Unremarkable.  No pancreatic ductal dilatation or surrounding inflammatory changes. Spleen: Normal in size without focal abnormality. Adrenals/Urinary Tract: Adrenal glands are within normal limits. Left kidney has been surgically removed. Right kidney is well visualized and within normal limits. The bladder is well distended. Stomach/Bowel: Scattered diverticular change of the colon is noted without evidence of diverticulitis. No obstructive changes of the colon are seen. Fecal material is noted throughout without obstructive change. The appendix is within normal limits. Small bowel and stomach are unremarkable. Lymphatic: No lymphadenopathy is noted. Reproductive: Uterus and bilateral adnexa are unremarkable. Other: No abdominal wall hernia or abnormality. No abdominopelvic ascites. Musculoskeletal: Right hip replacement is noted. No acute bony abnormality is seen. Review of the MIP images confirms the above findings. IMPRESSION: CTA of the chest: No evidence of aortic injury. No aneurysmal dilatation or dissection is noted. Esophageal dilatation with a radiopaque density just above the gastroesophageal junction which may represent a retained pill. Considerable amount of fluid is noted within the esophagus which corresponds with the given clinical history. This may represent a degree of achalasia. Scattered small less than 5 mm parenchymal nodules. No specific follow-up is recommended. CTA of the abdomen and pelvis: No acute aortic abnormality is noted. Diverticulosis without diverticulitis. No acute abnormality noted.  Electronically Signed   By: Oneil Devonshire M.D.   On: 03/08/2024 19:48    Assessment and Plan  Chest pain, nausea, vomiting Troponin negative x 2, not consistent with ACS.  Symptoms started after she took a pill yesterday morning. CT angiogram chest/abdomen/pelvis showing esophageal dilation with a radiopaque density just above the gastroesophageal junction which may represent a retained pill.  Considerable  amount of fluid is noted within the esophagus and findings felt to represent a degree of achalasia.  Patient is currently resting comfortably and no longer having any chest pain or vomiting.  ED physician discussed the case with Dr. Wilhelmenia with [facility] who recommended admission for observation and keeping n.p.o. after midnight for possible EGD in the morning.  Keep n.p.o., IV fluid hydration.  Incidental pulmonary nodules CT showing scattered small less than 5 mm parenchymal nodules.  Outpatient follow-up.  Type 2 diabetes A1c 6.1 in January 2025, repeat ordered.  Placed on sensitive sliding scale insulin  every 4 hours for now as patient is NPO.  Asthma/COPD: Stable, no signs of acute exacerbation. GERD Hyperlipidemia Hypertension: Most recent SBP in the 160s.  IV labetalol  PRN SBP >160. Anxiety Pharmacy med rec pending.  DVT prophylaxis: SCDs Code Status: DNR pre-arrest interventions desired (discussed with the patient) Family Communication: No family available at this time. Consults called: [facility] Level of care: Telemetry bed Admission status: It is my clinical opinion that referral for OBSERVATION is reasonable and necessary in this patient based on the above information provided. The aforementioned taken together are felt to place the patient at high risk for further clinical deterioration. However, it is anticipated that the patient may be medically stable for discharge from the hospital within 24 to 48 hours.  Editha Ram MD [facility]  If 7PM-7AM, please contact night-coverage [email or web address]  03/09/2024, 2:43 AM

## 2024-03-10 ENCOUNTER — Inpatient Hospital Stay (HOSPITAL_COMMUNITY): Admitting: Anesthesiology

## 2024-03-10 ENCOUNTER — Encounter (HOSPITAL_COMMUNITY): Payer: Self-pay | Admitting: Internal Medicine

## 2024-03-10 ENCOUNTER — Encounter (HOSPITAL_COMMUNITY)
Admission: EM | Disposition: A | Discharge status: Home Health | Payer: Self-pay | Source: Home / Self Care | Attending: Internal Medicine

## 2024-03-10 DIAGNOSIS — K581 Irritable bowel syndrome with constipation: Secondary | ICD-10-CM | POA: Diagnosis not present

## 2024-03-10 DIAGNOSIS — J449 Chronic obstructive pulmonary disease, unspecified: Secondary | ICD-10-CM

## 2024-03-10 DIAGNOSIS — I10 Essential (primary) hypertension: Secondary | ICD-10-CM

## 2024-03-10 DIAGNOSIS — K220 Achalasia of cardia: Secondary | ICD-10-CM | POA: Diagnosis not present

## 2024-03-10 DIAGNOSIS — R911 Solitary pulmonary nodule: Secondary | ICD-10-CM | POA: Diagnosis not present

## 2024-03-10 DIAGNOSIS — K219 Gastro-esophageal reflux disease without esophagitis: Secondary | ICD-10-CM | POA: Diagnosis not present

## 2024-03-10 DIAGNOSIS — R1319 Other dysphagia: Secondary | ICD-10-CM

## 2024-03-10 DIAGNOSIS — K222 Esophageal obstruction: Secondary | ICD-10-CM | POA: Diagnosis not present

## 2024-03-10 DIAGNOSIS — Z8249 Family history of ischemic heart disease and other diseases of the circulatory system: Secondary | ICD-10-CM | POA: Diagnosis not present

## 2024-03-10 DIAGNOSIS — R0789 Other chest pain: Secondary | ICD-10-CM | POA: Diagnosis not present

## 2024-03-10 DIAGNOSIS — B9681 Helicobacter pylori [H. pylori] as the cause of diseases classified elsewhere: Secondary | ICD-10-CM | POA: Diagnosis not present

## 2024-03-10 DIAGNOSIS — F419 Anxiety disorder, unspecified: Secondary | ICD-10-CM | POA: Diagnosis not present

## 2024-03-10 DIAGNOSIS — Z79818 Long term (current) use of other agents affecting estrogen receptors and estrogen levels: Secondary | ICD-10-CM | POA: Diagnosis not present

## 2024-03-10 DIAGNOSIS — Z96641 Presence of right artificial hip joint: Secondary | ICD-10-CM | POA: Diagnosis not present

## 2024-03-10 DIAGNOSIS — Z66 Do not resuscitate: Secondary | ICD-10-CM | POA: Diagnosis not present

## 2024-03-10 DIAGNOSIS — E119 Type 2 diabetes mellitus without complications: Secondary | ICD-10-CM | POA: Diagnosis not present

## 2024-03-10 DIAGNOSIS — K259 Gastric ulcer, unspecified as acute or chronic, without hemorrhage or perforation: Secondary | ICD-10-CM | POA: Diagnosis not present

## 2024-03-10 DIAGNOSIS — Z86718 Personal history of other venous thrombosis and embolism: Secondary | ICD-10-CM | POA: Diagnosis not present

## 2024-03-10 DIAGNOSIS — K2950 Unspecified chronic gastritis without bleeding: Secondary | ICD-10-CM

## 2024-03-10 DIAGNOSIS — Z7984 Long term (current) use of oral hypoglycemic drugs: Secondary | ICD-10-CM | POA: Diagnosis not present

## 2024-03-10 DIAGNOSIS — K3189 Other diseases of stomach and duodenum: Secondary | ICD-10-CM | POA: Diagnosis not present

## 2024-03-10 DIAGNOSIS — K449 Diaphragmatic hernia without obstruction or gangrene: Secondary | ICD-10-CM | POA: Diagnosis not present

## 2024-03-10 DIAGNOSIS — Z888 Allergy status to other drugs, medicaments and biological substances status: Secondary | ICD-10-CM | POA: Diagnosis not present

## 2024-03-10 DIAGNOSIS — Z79899 Other long term (current) drug therapy: Secondary | ICD-10-CM | POA: Diagnosis not present

## 2024-03-10 DIAGNOSIS — K208 Other esophagitis without bleeding: Secondary | ICD-10-CM | POA: Diagnosis present

## 2024-03-10 DIAGNOSIS — J4489 Other specified chronic obstructive pulmonary disease: Secondary | ICD-10-CM | POA: Diagnosis not present

## 2024-03-10 DIAGNOSIS — Z7982 Long term (current) use of aspirin: Secondary | ICD-10-CM | POA: Diagnosis not present

## 2024-03-10 DIAGNOSIS — Z885 Allergy status to narcotic agent status: Secondary | ICD-10-CM | POA: Diagnosis not present

## 2024-03-10 DIAGNOSIS — Z905 Acquired absence of kidney: Secondary | ICD-10-CM | POA: Diagnosis not present

## 2024-03-10 DIAGNOSIS — E785 Hyperlipidemia, unspecified: Secondary | ICD-10-CM | POA: Diagnosis not present

## 2024-03-10 HISTORY — PX: ESOPHAGOGASTRODUODENOSCOPY: SHX5428

## 2024-03-10 LAB — CBC
HCT: 46.2 % — ABNORMAL HIGH (ref 36.0–46.0)
Hemoglobin: 14.8 g/dL (ref 12.0–15.0)
MCH: 29.7 pg (ref 26.0–34.0)
MCHC: 32 g/dL (ref 30.0–36.0)
MCV: 92.6 fL (ref 80.0–100.0)
Platelets: 262 K/uL (ref 150–400)
RBC: 4.99 MIL/uL (ref 3.87–5.11)
RDW: 13.3 % (ref 11.5–15.5)
WBC: 7.6 K/uL (ref 4.0–10.5)
nRBC: 0 % (ref 0.0–0.2)

## 2024-03-10 LAB — BASIC METABOLIC PANEL WITH GFR
Anion gap: 11 (ref 5–15)
BUN: 12 mg/dL (ref 8–23)
CO2: 22 mmol/L (ref 22–32)
Calcium: 8.6 mg/dL — ABNORMAL LOW (ref 8.9–10.3)
Chloride: 106 mmol/L (ref 98–111)
Creatinine, Ser: 0.65 mg/dL (ref 0.44–1.00)
GFR, Estimated: >60 mL/min (ref >60)
Glucose, Bld: 106 mg/dL — ABNORMAL HIGH (ref 70–99)
Potassium: 3.5 mmol/L (ref 3.5–5.1)
Sodium: 139 mmol/L (ref 135–145)

## 2024-03-10 LAB — GLUCOSE, CAPILLARY
Glucose-Capillary: 101 mg/dL — ABNORMAL HIGH (ref 70–99)
Glucose-Capillary: 226 mg/dL — ABNORMAL HIGH (ref 70–99)
Glucose-Capillary: 90 mg/dL (ref 70–99)
Glucose-Capillary: 93 mg/dL (ref 70–99)
Glucose-Capillary: 96 mg/dL (ref 70–99)

## 2024-03-10 LAB — HEPATIC FUNCTION PANEL
ALT: 21 U/L (ref 0–44)
AST: 30 U/L (ref 15–41)
Albumin: 3.5 g/dL (ref 3.5–5.0)
Alkaline Phosphatase: 64 U/L (ref 38–126)
Bilirubin, Direct: 0.4 mg/dL — ABNORMAL HIGH (ref 0.0–0.2)
Indirect Bilirubin: 1.6 mg/dL — ABNORMAL HIGH (ref 0.3–0.9)
Total Bilirubin: 2 mg/dL — ABNORMAL HIGH (ref 0.0–1.2)
Total Protein: 6.9 g/dL (ref 6.5–8.1)

## 2024-03-10 LAB — HEMOGLOBIN A1C
Hgb A1c MFr Bld: 6.5 % — ABNORMAL HIGH (ref 4.8–5.6)
Mean Plasma Glucose: 140 mg/dL

## 2024-03-10 SURGERY — EGD (ESOPHAGOGASTRODUODENOSCOPY)
Anesthesia: Monitor Anesthesia Care

## 2024-03-10 MED ORDER — SODIUM CHLORIDE 0.9 % IV SOLN
INTRAVENOUS | Status: AC | PRN
  Administered 2024-03-10 (×2): 500 mL via INTRAMUSCULAR

## 2024-03-10 MED ORDER — LABETALOL HCL 5 MG/ML IV SOLN
INTRAVENOUS | Status: AC
Start: 2024-03-10 — End: 2024-03-10
  Filled 2024-03-10: qty 4

## 2024-03-10 MED ORDER — PROPOFOL 500 MG/50ML IV EMUL
INTRAVENOUS | Status: DC | PRN
  Administered 2024-03-10: 30 mg via INTRAVENOUS
  Administered 2024-03-10: 150 ug/kg/min via INTRAVENOUS
  Administered 2024-03-10: 30 mg via INTRAVENOUS
  Administered 2024-03-10: 150 ug/kg/min via INTRAVENOUS

## 2024-03-10 MED ORDER — SODIUM CHLORIDE 0.9 % IV SOLN: INTRAVENOUS | Status: DC

## 2024-03-10 MED ORDER — LIDOCAINE HCL (CARDIAC) PF 100 MG/5ML IV SOSY
PREFILLED_SYRINGE | INTRAVENOUS | Status: DC | PRN
  Administered 2024-03-10 (×2): 40 mg via INTRAVENOUS

## 2024-03-10 MED ORDER — LABETALOL HCL 5 MG/ML IV SOLN
5.0000 mg | Freq: Once | INTRAVENOUS | Status: AC
  Administered 2024-03-10 (×2): 5 mg via INTRAVENOUS

## 2024-03-10 MED ORDER — ONDANSETRON HCL 4 MG/2ML IJ SOLN
INTRAMUSCULAR | Status: DC | PRN
  Administered 2024-03-10 (×2): 4 mg via INTRAVENOUS

## 2024-03-10 NOTE — Transfer of Care (Signed)
 Immediate Anesthesia Transfer of Care Note  Patient: Alexa [name]  Procedure(s) Performed: Procedure(s): EGD (ESOPHAGOGASTRODUODENOSCOPY) (N/A)  Patient Location: PACU  Anesthesia Type:MAC  Level of Consciousness:  sedated, patient cooperative and responds to stimulation  Airway & Oxygen Therapy:Patient Spontanous Breathing and Patient connected to face mask oxgen  Post-op Assessment:  Report given to PACU RN and Post -op Vital signs reviewed and stable  Post vital signs:  Reviewed and stable  Last Vitals:  Vitals:   03/10/24 1410 03/10/24 1435  BP: (!) 179/94 (!) 181/97  Pulse: 88 91  Resp: (!) 23 18  Temp:    SpO2: 94% 96%    Complications: No apparent anesthesia complications

## 2024-03-10 NOTE — Plan of Care (Signed)

## 2024-03-10 NOTE — Progress Notes (Signed)
 [facility] Progress Note  CC:  Atypical chest pain, dysphagia   Subjective: She had difficulty swallowing her dinner yesterday.  She spoke with her son yesterday evening regarding having an upper endoscopy and she stated he recommended for her to do this procedure while she was in the hospital. She wishes to proceed with an upper endoscopy today if possible.  She denies having any chest pain or shortness of breath.  Objective:  Vital signs in last 24 hours: Temp:  [97.5 F (36.4 C)-98.8 F (37.1 C)] 97.5 F (36.4 C) (08/13 0420) Pulse Rate:  [51-76] 72 (08/13 0420) Resp:  [14-20] 14 (08/13 0420) BP: (153-179)/(80-95) 158/95 (08/13 0420) SpO2:  [93 %-98 %] 98 % (08/13 0420) Last BM Date : 03/09/24 General: 89 year old female alert in no acute distress. Heart: Irregular rhythm, no murmurs. Pulm: Breath sounds clear throughout. Abdomen: Soft, nondistended.  Nontender.  Positive bowel sounds to all 4 quadrants. Extremities: No lower extremity edema. Neurologic:  Alert and oriented x 4.  Cognitively grossly intact.  Speech is clear.  Moves all extremities equally. Psych:  Alert and cooperative. Normal mood and affect.  Intake/Output from previous day: 08/12 0701 - 08/13 0700 In: 1047.6 [I.V.:1047.6] Out: -  Intake/Output this shift: No intake/output data recorded.  Lab Results: Recent Labs    03/08/24 1407  WBC 8.8  HGB 15.9*  HCT 48.6*  PLT 304   BMET Recent Labs    03/08/24 1407 03/09/24 0514  NA 141 141  K 4.2 3.8  CL 104 108  CO2 23 21*  GLUCOSE 187* 95  BUN 28* 20  CREATININE 0.88 0.75  CALCIUM 9.6 8.7*   LFT Recent Labs    03/09/24 0514 03/09/24 0526  PROT 7.0  --   ALBUMIN  3.6  --   AST 25  --   ALT 9  --   ALKPHOS 69  --   BILITOT 2.1*  --   BILIDIR  --  0.3*   PT/INR No results for input(s): LABPROT, INR in the last 72 hours. Hepatitis Panel No results for input(s): HEPBSAG, HCVAB, HEPAIGM, HEPBIGM in the  last 72 hours.  DG ESOPHAGUS W SINGLE CM (SOL OR THIN BA) Result Date: 03/09/2024 CLINICAL DATA:  89 year old with dysphagia and dilated esophagus on chest CT. Clinical concern for achalasia. EXAM: ESOPHOGRAM/BARIUM SWALLOW TECHNIQUE: Single contrast examination was performed using  thin barium. FLUOROSCOPY: Radiation Exposure Index (as provided by the fluoroscopic device): 11.7 mGy Kerma COMPARISON:  Chest radiographs 08/07/2023. Chest CT 03/08/2024 and 10/18/2022. FINDINGS: Single-contrast examination was performed with the patient in the semi erect, supine and right lateral decubitus positions. Patient's limited mobility precludes a double contrast study. The patient swallowed the barium without difficulty. No laryngeal penetration or aspiration was observed. The esophagus appears only mildly dilated, improved from yesterday's CT. There is esophageal dysmotility with a decreased primary stripping wave and barium stasis in the semi erect and right lateral decubitus positions. There is narrowing of the esophageal lumen at the gastroesophageal junction with a persistent filling defect measuring approximately 1.2 cm in diameter. This may correspond with a dense filling defect on recent CT, although the persistence would argue against a pill fragment. No other intraluminal filling defects or mucosal abnormalities are identified. A 13 mm barium tablet was administered and passed without significant delay into the distal esophagus. In the semi erect position, passage into the stomach was not demonstrated despite multiple swallows of water  over an approximately 4 minute interval. IMPRESSION: 1.  Moderate esophageal dysmotility with a decreased primary stripping wave and barium stasis. Typical signs of achalasia are not demonstrated. 2. Probable distal esophageal stricture, not optimally evaluated. 3. Persistent indeterminate filling defect in the distal esophagus as seen on recent CTA, not clearly a retained pill  fragment. Consider fibrovascular polyp or other mucosal lesion. Recommend endoscopy or short-term follow-up barium study to assess for persistence. Electronically Signed   By: Elsie Perone M.D.   On: 03/09/2024 13:40   CT Angio Chest/Abd/Pel for Dissection W and/or Wo Contrast Result Date: 03/08/2024 CLINICAL DATA:  Chest pain and vomiting, initial encounter EXAM: CT ANGIOGRAPHY CHEST, ABDOMEN AND PELVIS TECHNIQUE: Non-contrast CT of the chest was initially obtained. Multidetector CT imaging through the chest, abdomen and pelvis was performed using the standard protocol during bolus administration of intravenous contrast. Multiplanar reconstructed images and MIPs were obtained and reviewed to evaluate the vascular anatomy. RADIATION DOSE REDUCTION: This exam was performed according to the departmental dose-optimization program which includes automated exposure control, adjustment of the mA and/or kV according to patient size and/or use of iterative reconstruction technique. CONTRAST:  OMNIPAQUE  IOHEXOL  350 MG/ML SOLN COMPARISON:  08/07/2023 FINDINGS: CTA CHEST FINDINGS Cardiovascular: Precontrast images demonstrate atherosclerotic calcifications of the thoracic aorta. No aneurysmal dilatation is seen. No hyperdense crescent to suggest acute aortic injury is noted. Post-contrast images show the left vertebral artery to arise directly from the aorta. No evidence of dissection is noted. No aneurysmal dilatation is seen. Heart is mildly enlarged in size. The pulmonary artery shows a normal branching pattern without intraluminal filling defect to suggest pulmonary embolism. Mediastinum/Nodes: Thoracic inlet is within normal limits. No hilar or mediastinal adenopathy is noted. The esophagus is dilated throughout its course and filled with fluid. A radiopaque density is noted at the gastroesophageal junction likely representing a an ingested tablet. This may also be related to a degree of achalasia.  Lungs/Pleura: Lungs are well aerated bilaterally. Emphysematous changes are seen. Diffuse subpleural fibrotic changes are noted bilaterally. Few scattered tiny parenchymal nodules are noted measuring less than 5 mm. No specific follow-up is recommended. No focal confluent infiltrate or effusion is seen. Musculoskeletal: Degenerative changes of the thoracic spine are seen. Chronic appearing T12 compression deformity is noted. Review of the MIP images confirms the above findings. CTA ABDOMEN AND PELVIS FINDINGS VASCULAR Aorta: Atherosclerotic calcifications are noted without aneurysmal dilatation. Celiac: Patent without evidence of aneurysm, dissection, vasculitis or significant stenosis. SMA: Patent without evidence of aneurysm, dissection, vasculitis or significant stenosis. Renals: Single renal artery is noted on the right. The left artery is not appreciated due to prior left nephrectomy. IMA: Within normal limits. Inflow: Iliacs demonstrate atherosclerotic calcification without acute abnormality. Veins: No specific venous abnormality is noted. Review of the MIP images confirms the above findings. NON-VASCULAR Hepatobiliary: No focal liver abnormality is seen. No gallstones, gallbladder wall thickening, or biliary dilatation. Pancreas: Unremarkable. No pancreatic ductal dilatation or surrounding inflammatory changes. Spleen: Normal in size without focal abnormality. Adrenals/Urinary Tract: Adrenal glands are within normal limits. Left kidney has been surgically removed. Right kidney is well visualized and within normal limits. The bladder is well distended. Stomach/Bowel: Scattered diverticular change of the colon is noted without evidence of diverticulitis. No obstructive changes of the colon are seen. Fecal material is noted throughout without obstructive change. The appendix is within normal limits. Small bowel and stomach are unremarkable. Lymphatic: No lymphadenopathy is noted. Reproductive: Uterus and  bilateral adnexa are unremarkable. Other: No abdominal wall hernia or abnormality. No abdominopelvic  ascites. Musculoskeletal: Right hip replacement is noted. No acute bony abnormality is seen. Review of the MIP images confirms the above findings. IMPRESSION: CTA of the chest: No evidence of aortic injury. No aneurysmal dilatation or dissection is noted. Esophageal dilatation with a radiopaque density just above the gastroesophageal junction which may represent a retained pill. Considerable amount of fluid is noted within the esophagus which corresponds with the given clinical history. This may represent a degree of achalasia. Scattered small less than 5 mm parenchymal nodules. No specific follow-up is recommended. CTA of the abdomen and pelvis: No acute aortic abnormality is noted. Diverticulosis without diverticulitis. No acute abnormality noted. Electronically Signed   By: Oneil Devonshire M.D.   On: 03/08/2024 19:48   Patient Profile:  Alexa [name] is a 89 y.o. female with a past medical history of hypertension, asthma, COPD, DM type II, renal cell carcinoma s/p left nephrectomy 2008, PE 07/2007, diverticulosis, ischemic colitis and hyperplastic colon polyps.   Assessment / Plan:  89 year old female admitted 03/08/2024 with N/V, vague dysphagia and atypical CP x 1 week. Emesis described as nonbloody clear to white foam. CTA showed a dilated esophagus with a radiopaque density above the esophageal junction possibly representing a retained pill with considerable amount of fluid in the esophagus concerning for achalasia. Patient did not wish to pursue an EGD yesterday, however, she had difficulty swallowing her dinner yesterday evening and she wishes to proceed with an EGD today.  NPO since midnight.  Hemodynamically stable. - NPO - IVFs per the hospitalist  - Pantoprazole  40 mg IV QD - EGD today with Dr. Stacia, benefits and risks discussed including risk with sedation, risk of bleeding, perforation and  infection  - IV fluids per the hospitalist  - Ondansetron  4 mg PR IV every 6 hours as needed - CBC and BMP   Elevated T. Bili with normal Alk phos, AST/ALT levels, likely secondary to vomiting.  Abdominal/pelvic CTA showed a normal liver and gallbladder without biliary ductal dilatation. T. Bili 1.7 -> 2.1. Indirect bili > direct.  -Hepatic panel    Chronic constipation  -Miralax  Q HS -Recommend outpatient follow up   History of hyperplastic colon polyps   DM type II   COPD   History or renal cell carcinoma s/p left nephrectomy    Remote history of PE, not on AC  Principal Problem:   Non-cardiac chest pain Active Problems:   Controlled type 2 diabetes mellitus without complication, without long-term current use of insulin  (HCC)   Hyperlipidemia   GERD   COPD (chronic obstructive pulmonary disease) (HCC)   Incidental pulmonary nodule   Dysphagia     LOS: 0 days   Elida CHRISTELLA Shawl  03/10/2024, 9:16 AM

## 2024-03-10 NOTE — Op Note (Signed)
 [facility] Patient Name: Alexa [name] Procedure Date: 03/10/2024 MRN: [id number] Attending MD: Glendia BRAVO. Stacia , MD, [id number] Date of Birth: [birth year] CSN: [id number] Age: 89 Admit Type: Inpatient Procedure:                Upper GI endoscopy Indications:              Dysphagia, transient esophageal obstruction,                            Abnormal cine-esophagram Providers:                Glendia E. Stacia, MD, Hoy Penner, RN,                            Fairy Marina, Technician Referring MD:              Medicines:                Monitored Anesthesia Care Complications:            No immediate complications. Estimated Blood Loss:     Estimated blood loss was minimal. Procedure:                Pre-Anesthesia Assessment:                           - Prior to the procedure, a History and Physical                            was performed, and patient medications and                            allergies were reviewed. The patient's tolerance of                            previous anesthesia was also reviewed. The risks                            and benefits of the procedure and the sedation                            options and risks were discussed with the patient.                            All questions were answered, and informed consent                            was obtained. Prior Anticoagulants: The patient has                            taken no anticoagulant or antiplatelet agents                            except for aspirin . ASA Grade Assessment: III - A  patient with severe systemic disease. After                            reviewing the risks and benefits, the patient was                            deemed in satisfactory condition to undergo the                            procedure.                           After obtaining informed consent, the endoscope was                            passed under direct vision.  Throughout the                            procedure, the patient's blood pressure, pulse, and                            oxygen saturations were monitored continuously. The                            [id number]-H190 ([id number]) Olympus endoscope was introduced                            through the mouth, and advanced to the second part                            of duodenum. The upper GI endoscopy was                            accomplished without difficulty. Scope In: Scope Out: Findings:      One benign-appearing, intrinsic moderate stenosis was found at the       gastroesophageal junction. This stenosis measured 9 mm (inner diameter)       x less than one cm (in length). There was some erythema and friability.       The stenosis was traversed. A TTS dilator was passed through the scope.       Dilation with a 05-09-11 mm balloon dilator was performed to 12 mm. The       dilation site was examined and showed mild improvement in luminal       narrowing. Estimated blood loss was minimal. Biopsies were taken with a       cold forceps for histology. Estimated blood loss was minimal.      The exam of the esophagus was otherwise normal.      A small hiatal hernia was present.      Few non-bleeding superficial gastric ulcers with no stigmata of bleeding       were found in the gastric antrum. The largest lesion was 4 mm in largest       dimension. Biopsies were taken with a cold forceps for Helicobacter       pylori testing. Estimated blood loss was minimal.  The exam of the stomach was otherwise normal.      Patchy mildly erythematous mucosa was found in the duodenal bulb.      The exam of the duodenum was otherwise normal. Impression:               - Benign-appearing esophageal stenosis. Dilated.                            Biopsied. This is likely secondary to acid reflux.                           - Small hiatal hernia.                           - Non-bleeding gastric ulcers with no stigmata  of                            bleeding. Biopsied. These are likely related to                            aspirin /NSAIDs.                           - Erythematous duodenopathy. Moderate Sedation:      Not Applicable - Patient had care per Anesthesia. Recommendation:           - Return patient to hospital ward for possible                            discharge same day.                           - Soft diet today. Resume regular diet tomorrow                           - Continue present medications.                           - Await pathology results.                           - Use Protonix  (pantoprazole ) 40 mg PO BID for 8                            weeks to allow gastric ulcers and esophagitis to                            heal.                           - Would recommend indefinite PPI therapy to reduce                            risk of recurrent/worsening esophageal stricture. Procedure Code(s):        --- Professional ---  [phone], Esophagogastroduodenoscopy, flexible,                            transoral; with transendoscopic balloon dilation of                            esophagus (less than 30 mm diameter)                           43239, 59, Esophagogastroduodenoscopy, flexible,                            transoral; with biopsy, single or multiple Diagnosis Code(s):        --- Professional ---                           K22.2, Esophageal obstruction                           K44.9, Diaphragmatic hernia without obstruction or                            gangrene                           K25.9, Gastric ulcer, unspecified as acute or                            chronic, without hemorrhage or perforation                           K31.89, Other diseases of stomach and duodenum                           R13.10, Dysphagia, unspecified                           R93.3, Abnormal findings on diagnostic imaging of                            other parts of digestive  tract CPT copyright 2022 [employer]. All rights reserved. The codes documented in this report are preliminary and upon coder review may  be revised to meet current compliance requirements. Jahniah Pallas E. Stacia, MD 03/10/2024 3:09:31 PM This report has been signed electronically. Number of Addenda: 0

## 2024-03-10 NOTE — Interval H&P Note (Signed)
 History and Physical Interval Note:  03/10/2024 2:39 PM  Alexa [name]  has presented today for surgery, with the diagnosis of Dysphagia.  The various methods of treatment have been discussed with the patient and family. After consideration of risks, benefits and other options for treatment, the patient has consented to  Procedure(s): EGD (ESOPHAGOGASTRODUODENOSCOPY) (N/A) as a surgical intervention.  The patient's history has been reviewed, patient examined, no change in status, stable for surgery.  I have reviewed the patient's chart and labs.  Questions were answered to the patient's satisfaction.    Although she had initially declined an EGD, after discussing the matter further with her son, she decided to proceed with the procedure, as was recommended yesterday.  She denied any swallowing difficulties yesterday.  Alexa [name]

## 2024-03-10 NOTE — Progress Notes (Signed)
 Progress Note    Alexa [name]   FMW:[id number]  DOB: 04/07/1930  DOA: 03/08/2024     0 PCP: Valentin Skates, DO  Initial CC: Difficulty swallowing  Hospital Course: Alexa [name] is a 89 y.o. female with medical history significant of asthma/COPD, angioedema, GERD, IBS, ischemic colitis, renal carcinoma status post left nephrectomy in 2008, hyperlipidemia, history of VTE, type 2 diabetes, hypertension, anxiety presenting with complaints of chest pain, nausea, and vomiting.    Patient states sometimes after she eats food gets stuck in her throat.  She took a pill in the morning (does not remember which medication) and afterwards went to the kitchen to have breakfast.  She had a few sips of coffee and ate oatmeal and afterwards started having substernal chest pain and vomiting.  She had multiple episodes of vomiting throughout the day and was not able to tolerate p.o. intake so she came into the ED to be evaluated.  Denies any further episodes of vomiting since arrival to the hospital.  No longer having chest pain.  Denies abdominal pain.  No other complaints.  She never had an EGD done in the past.  CT angio of the chest was performed which was negative for aortic injury or aneurysmal dilation or dissection.  Showed esophageal dilation with a radiopaque density just above the GE junction which may have represented a retained pill.  There was also fluid noted within the esophagus and possibly representing achalasia or other abnormality.  Case was discussed with GI and she was recommended for admission and further evaluation with possible EGD.   Assessment and Plan  Noncardiac chest pain Nausea/vomiting Dysphagia - Troponin negative x 2, not consistent with ACS.   - Symptoms started after she took a pill and developed difficulty swallowing and epigastric pain; given chronic issue with acute worsening, may have progression of underlying esophageal stenosis versus mass versus achalasia versus  other etiology -Barium swallow performed 03/09/2024 showing moderate esophageal dysmotility and a probable distal esophageal stricture but not optimally evaluated - Patient initially declining EGD on 03/09/2024 but now amenable on 03/10/2024 -Went EGD and found to have intrinsic moderate stenosis at the GE junction treated with dilation; also noted to have a few nonbleeding superficial gastric ulcers near the gastric antrum - She is recommended for 8 weeks Protonix  twice daily followed by Protonix  daily indefinitely - Will continue soft diet today and advance to regular diet tomorrow and plan for discharge   Incidental pulmonary nodules CT showing scattered small less than 5 mm parenchymal nodules.  - No specific follow-up indicated.   Type 2 diabetes A1c 6.1 in January 2025 - A1c 6.5% on 03/09/2024 -Continue SSI and CBG monitoring   Asthma/COPD: Stable, no signs of acute exacerbation. GERD -continue Protonix  Hyperlipidemia -hold any home meds for now Hypertension: PRN meds Anxiety -hold Atarax  for now  Interval History:  No events overnight.  Had trouble with still swallowing yesterday and she is amenable with EGD today.  Old records reviewed in assessment of this patient  Antimicrobials:   DVT prophylaxis:  SCDs Start: 03/09/24 0321   Code Status:   Code Status: Do not attempt resuscitation (DNR) PRE-ARREST INTERVENTIONS DESIRED  Mobility Assessment (Last 72 Hours)     Mobility Assessment     Row Name 03/10/24 0734 03/09/24 2032 03/09/24 1901 03/09/24 0839 03/08/24 2226   Does the patient have exclusion criteria? No - Perform mobility assessment No - Perform mobility assessment No - Perform mobility assessment No - Perform  mobility assessment No - Perform mobility assessment   What is the highest level of mobility based on the mobility assessment? Level 4 (Ambulates with assistance) - Balance while stepping forward/back - Complete Level 4 (Ambulates with assistance) - Balance  while stepping forward/back - Complete Level 4 (Ambulates with assistance) - Balance while stepping forward/back - Complete Level 4 (Ambulates with assistance) - Balance while stepping forward/back - Complete Level 4 (Ambulates with assistance) - Balance while stepping forward/back - Complete      Barriers to discharge: None Disposition Plan: Home HH orders placed: N/A Status is: Inpatient  Objective: Blood pressure (!) 178/95, pulse 82, temperature 98.8 F (37.1 C), temperature source Temporal, resp. rate (!) 23, height 5' 1 (1.549 m), weight 47.6 kg, SpO2 99%.  Examination:  Physical Exam Constitutional:      Appearance: Normal appearance.  HENT:     Head: Normocephalic and atraumatic.     Mouth/Throat:     Mouth: Mucous membranes are moist.  Eyes:     Extraocular Movements: Extraocular movements intact.  Cardiovascular:     Rate and Rhythm: Normal rate and regular rhythm.  Pulmonary:     Effort: Pulmonary effort is normal. No respiratory distress.     Breath sounds: Normal breath sounds. No wheezing.  Abdominal:     General: Bowel sounds are normal. There is no distension.     Palpations: Abdomen is soft.     Tenderness: There is no abdominal tenderness.  Musculoskeletal:        General: Normal range of motion.     Cervical back: Normal range of motion and neck supple.  Skin:    General: Skin is warm and dry.  Neurological:     General: No focal deficit present.     Mental Status: She is alert.  Psychiatric:        Mood and Affect: Mood normal.      Consultants:  GI  Procedures:  03/10/2024: EGD  Data Reviewed: Results for orders placed or performed during the hospital encounter of 03/08/24 (from the past 24 hours)  Glucose, capillary     Status: Abnormal   Collection Time: 03/09/24  4:41 PM  Result Value Ref Range   Glucose-Capillary 138 (H) 70 - 99 mg/dL  Glucose, capillary     Status: None   Collection Time: 03/09/24  8:17 PM  Result Value Ref Range    Glucose-Capillary 75 70 - 99 mg/dL  Glucose, capillary     Status: None   Collection Time: 03/10/24 12:08 AM  Result Value Ref Range   Glucose-Capillary 93 70 - 99 mg/dL  Glucose, capillary     Status: None   Collection Time: 03/10/24  4:22 AM  Result Value Ref Range   Glucose-Capillary 96 70 - 99 mg/dL  Glucose, capillary     Status: None   Collection Time: 03/10/24  7:38 AM  Result Value Ref Range   Glucose-Capillary 90 70 - 99 mg/dL  CBC     Status: Abnormal   Collection Time: 03/10/24 10:00 AM  Result Value Ref Range   WBC 7.6 4.0 - 10.5 K/uL   RBC 4.99 3.87 - 5.11 MIL/uL   Hemoglobin 14.8 12.0 - 15.0 g/dL   HCT 53.7 (H) 63.9 - 53.9 %   MCV 92.6 80.0 - 100.0 fL   MCH 29.7 26.0 - 34.0 pg   MCHC 32.0 30.0 - 36.0 g/dL   RDW 86.6 88.4 - 84.4 %   Platelets 262 150 - 400  K/uL   nRBC 0.0 0.0 - 0.2 %  Basic metabolic panel     Status: Abnormal   Collection Time: 03/10/24 10:00 AM  Result Value Ref Range   Sodium 139 135 - 145 mmol/L   Potassium 3.5 3.5 - 5.1 mmol/L   Chloride 106 98 - 111 mmol/L   CO2 22 22 - 32 mmol/L   Glucose, Bld 106 (H) 70 - 99 mg/dL   BUN 12 8 - 23 mg/dL   Creatinine, Ser 9.34 0.44 - 1.00 mg/dL   Calcium 8.6 (L) 8.9 - 10.3 mg/dL   GFR, Estimated >39 >39 mL/min   Anion gap 11 5 - 15  Hepatic function panel Once     Status: Abnormal   Collection Time: 03/10/24 10:00 AM  Result Value Ref Range   Total Protein 6.9 6.5 - 8.1 g/dL   Albumin  3.5 3.5 - 5.0 g/dL   AST 30 15 - 41 U/L   ALT 21 0 - 44 U/L   Alkaline Phosphatase 64 38 - 126 U/L   Total Bilirubin 2.0 (H) 0.0 - 1.2 mg/dL   Bilirubin, Direct 0.4 (H) 0.0 - 0.2 mg/dL   Indirect Bilirubin 1.6 (H) 0.3 - 0.9 mg/dL  Glucose, capillary     Status: Abnormal   Collection Time: 03/10/24 11:40 AM  Result Value Ref Range   Glucose-Capillary 101 (H) 70 - 99 mg/dL    I have reviewed pertinent nursing notes, vitals, labs, and images as necessary. I have ordered labwork to follow up on as indicated.  I  have reviewed the last notes from staff over past 24 hours. I have discussed patient's care plan and test results with nursing staff, CM/SW, and other staff as appropriate.  Time spent: Greater than 50% of the 55 minute visit was spent in counseling/coordination of care for the patient as laid out in the A&P.   LOS: 0 days   Alm Apo, MD [facility] 03/10/2024, 3:49 PM

## 2024-03-10 NOTE — Anesthesia Preprocedure Evaluation (Addendum)
 Anesthesia Evaluation  Patient identified by MRN, date of birth, ID band Patient awake    Reviewed: Allergy & Precautions, NPO status , Patient's Chart, lab work & pertinent test results  Airway Mallampati: II  TM Distance: >3 FB Neck ROM: Full    Dental  (+) Partial Upper, Chipped, Dental Advisory Given,    Pulmonary asthma , COPD, PE   Pulmonary exam normal breath sounds clear to auscultation       Cardiovascular hypertension, Pt. on medications + DVT  Normal cardiovascular exam Rhythm:Regular Rate:Normal  TTE 2025 1. Left ventricular ejection fraction, by estimation, is 50 to 55%. The  left ventricle has low normal function. The left ventricle has no regional  wall motion abnormalities. Left ventricular diastolic parameters are  consistent with Grade I diastolic  dysfunction (impaired relaxation). The average left ventricular global  longitudinal strain is -16.0 %. The global longitudinal strain is  abnormal.   2. Right ventricular systolic function is normal. The right ventricular  size is normal. There is normal pulmonary artery systolic pressure. The  estimated right ventricular systolic pressure is 24.5 mmHg.   3. Trivial mitral valve regurgitation.   4. The aortic valve is tricuspid. Aortic valve regurgitation is mild.   5. Aneurysm of the aortic root, measuring 49 mm.   6. The inferior vena cava is normal in size with greater than 50%  respiratory variability, suggesting right atrial pressure of 3 mmHg.     Neuro/Psych  PSYCHIATRIC DISORDERS  Depression    negative neurological ROS     GI/Hepatic ,GERD  ,,(+)     substance abuse (norco 5/325)    Endo/Other  diabetes, Type 2    Renal/GU Renal InsufficiencyRenal diseaseRCC  negative genitourinary   Musculoskeletal negative musculoskeletal ROS (+)  narcotic dependent  Abdominal   Peds  Hematology negative hematology ROS (+)   Anesthesia Other  Findings   Reproductive/Obstetrics                              Anesthesia Physical Anesthesia Plan  ASA: 3  Anesthesia Plan: MAC   Post-op Pain Management: Minimal or no pain anticipated   Induction: Intravenous  PONV Risk Score and Plan: 2 and Propofol  infusion and Treatment may vary due to age or medical condition  Airway Management Planned: Natural Airway  Additional Equipment:   Intra-op Plan:   Post-operative Plan:   Informed Consent: I have reviewed the patients History and Physical, chart, labs and discussed the procedure including the risks, benefits and alternatives for the proposed anesthesia with the patient or authorized representative who has indicated his/her understanding and acceptance.   Patient has DNR.  Discussed DNR with patient and Suspend DNR.   Dental advisory given  Plan Discussed with: CRNA  Anesthesia Plan Comments:          Anesthesia Quick Evaluation

## 2024-03-11 ENCOUNTER — Encounter (HOSPITAL_COMMUNITY): Payer: Self-pay | Admitting: Gastroenterology

## 2024-03-11 ENCOUNTER — Other Ambulatory Visit (HOSPITAL_COMMUNITY): Payer: Self-pay

## 2024-03-11 DIAGNOSIS — R0789 Other chest pain: Secondary | ICD-10-CM | POA: Diagnosis not present

## 2024-03-11 DIAGNOSIS — R1319 Other dysphagia: Secondary | ICD-10-CM | POA: Diagnosis not present

## 2024-03-11 LAB — GLUCOSE, CAPILLARY
Glucose-Capillary: 89 mg/dL (ref 70–99)
Glucose-Capillary: 90 mg/dL (ref 70–99)
Glucose-Capillary: 92 mg/dL (ref 70–99)

## 2024-03-11 MED ORDER — SENNOSIDES-DOCUSATE SODIUM 8.6-50 MG PO TABS
1.0000 | ORAL_TABLET | Freq: Two times a day (BID) | ORAL | 3 refills | Status: AC
  Filled 2024-03-11: qty 60, 30d supply, fill #0
  Filled 2024-05-10: qty 60, 30d supply, fill #1

## 2024-03-11 MED ORDER — SENNOSIDES-DOCUSATE SODIUM 8.6-50 MG PO TABS
1.0000 | ORAL_TABLET | Freq: Two times a day (BID) | ORAL | Status: DC
  Administered 2024-03-11: 1 via ORAL
  Filled 2024-03-11: qty 1

## 2024-03-11 MED ORDER — PANTOPRAZOLE SODIUM 40 MG PO TBEC: 40.0000 mg | DELAYED_RELEASE_TABLET | Freq: Two times a day (BID) | ORAL | Status: DC

## 2024-03-11 MED ORDER — POLYETHYLENE GLYCOL 3350 17 G PO PACK
17.0000 g | PACK | Freq: Every day | ORAL | Status: DC
  Administered 2024-03-11: 17 g via ORAL
  Filled 2024-03-11: qty 1

## 2024-03-11 MED ORDER — AMLODIPINE BESYLATE 5 MG PO TABS
5.0000 mg | ORAL_TABLET | Freq: Every day | ORAL | Status: DC
  Filled 2024-03-11: qty 1

## 2024-03-11 MED ORDER — PANTOPRAZOLE SODIUM 40 MG PO TBEC
DELAYED_RELEASE_TABLET | ORAL | 3 refills | Status: AC
Start: 2024-03-11 — End: ?
  Filled 2024-03-11: qty 90, 60d supply, fill #0
  Filled 2024-05-10: qty 90, 60d supply, fill #1

## 2024-03-11 MED ORDER — POLYETHYLENE GLYCOL 3350 17 G PO PACK
17.0000 g | PACK | Freq: Every day | ORAL | Status: AC | PRN
Start: 2024-03-11 — End: ?

## 2024-03-11 NOTE — Progress Notes (Signed)
 This RN assisted patient with shower and went over AVS. Patient is in room dressed with all belongings. Patient brother is on the way to pick up patient.

## 2024-03-11 NOTE — TOC Transition Note (Signed)
 Transition of Care [facility]) - Discharge Note   Patient Details  Name: Alexa [name] MRN: [id number] Date of Birth: [birth year]  [facility] CM/SW Contact:  Toy LITTIE Agar, RN Phone Number:[phone]  03/11/2024, 1:37 PM   Clinical Narrative:    Patient discharging home. PT orders have been entered. Patient discharging home with [facility]. Active with [facility] and will resume at discharge. Orders have been entered.          Patient Goals and CMS Choice            Discharge Placement                       Discharge Plan and Services Additional resources added to the After Visit Summary for                                       Social Drivers of Health (SDOH) Interventions SDOH Screenings   Food Insecurity: No Food Insecurity (08/07/2023)  Housing: Low Risk  (08/07/2023)  Transportation Needs: No Transportation Needs (08/07/2023)  Utilities: Not At Risk (08/07/2023)  Alcohol  Screen: Low Risk  (04/03/2018)  Depression (PHQ2-9): Low Risk  (04/07/2020)  Financial Resource Strain: Low Risk  (08/08/2020)  Social Connections: Socially Isolated (08/07/2023)  Tobacco Use: Low Risk  (03/10/2024)     Readmission Risk Interventions    05/23/2023   11:53 AM 02/17/2023   10:42 AM 12/17/2021    1:08 PM  Readmission Risk Prevention Plan  Post Dischage Appt  Complete   Medication Screening  Complete   Transportation Screening Complete Complete Complete  PCP or Specialist Appt within 5-7 Days Complete  Complete  Home Care Screening Complete  Complete  Medication Review (RN CM) Complete  Complete

## 2024-03-11 NOTE — Discharge Summary (Signed)
 Physician Discharge Summary   Rakeya Glab Vangorder FMW:[id number] DOB: 1930/03/22 DOA: 03/08/2024  PCP: Valentin Skates, DO  Admit date: 03/08/2024 Discharge date: 03/11/2024  Admitted From: Home Disposition: Home Discharging physician: Alm Apo, MD Barriers to discharge: None   Discharge Condition: stable CODE STATUS: Full Diet recommendation:  Diet Orders (From admission, onward)     Start     Ordered   03/11/24 0000  Diet general        03/11/24 1051   03/10/24 1523  DIET SOFT Room service appropriate? Yes; Fluid consistency: Thin  Diet effective now       Question Answer Comment  Room service appropriate? Yes   Fluid consistency: Thin      03/10/24 1522            Hospital Course: Alexa [name] is a 89 y.o. female with medical history significant of asthma/COPD, angioedema, GERD, IBS, ischemic colitis, renal carcinoma status post left nephrectomy in 2008, hyperlipidemia, history of VTE, type 2 diabetes, hypertension, anxiety presenting with complaints of chest pain, nausea, and vomiting.    Patient states sometimes after she eats food gets stuck in her throat.  She took a pill in the morning (does not remember which medication) and afterwards went to the kitchen to have breakfast.  She had a few sips of coffee and ate oatmeal and afterwards started having substernal chest pain and vomiting.  She had multiple episodes of vomiting throughout the day and was not able to tolerate p.o. intake so she came into the ED to be evaluated.  Denies any further episodes of vomiting since arrival to the hospital.  No longer having chest pain.  Denies abdominal pain.  No other complaints.  She never had an EGD done in the past.  CT angio of the chest was performed which was negative for aortic injury or aneurysmal dilation or dissection.  Showed esophageal dilation with a radiopaque density just above the GE junction which may have represented a retained pill.  There was also fluid noted  within the esophagus and possibly representing achalasia or other abnormality.  Case was discussed with GI and she was recommended for admission and further evaluation with possible EGD.   Assessment and Plan  Noncardiac chest pain Nausea/vomiting Dysphagia - Troponin negative x 2, not consistent with ACS.   - Symptoms started after she took a pill and developed difficulty swallowing and epigastric pain; given chronic issue with acute worsening, may have progression of underlying esophageal stenosis versus mass versus achalasia versus other etiology -Barium swallow performed 03/09/2024 showing moderate esophageal dysmotility and a probable distal esophageal stricture but not optimally evaluated - Patient initially declining EGD on 03/09/2024 but now amenable on 03/10/2024 -Went EGD and found to have intrinsic moderate stenosis at the GE junction treated with dilation; also noted to have a few nonbleeding superficial gastric ulcers near the gastric antrum - She is recommended for 8 weeks Protonix  twice daily followed by Protonix  daily indefinitely - Tolerated soft diet well prior to discharge   Incidental pulmonary nodules CT showing scattered small less than 5 mm parenchymal nodules.  - No specific follow-up indicated.   Type 2 diabetes A1c 6.1 in January 2025 - A1c 6.5% on 03/09/2024   Asthma/COPD: Stable, no signs of acute exacerbation. GERD -continue Protonix  Hyperlipidemia -no statin noted on med rec Hypertension: PRN meds Anxiety - resume hydroxyzine  at discharge   The patient's acute and chronic medical conditions were treated accordingly. On day of discharge, patient was  felt deemed stable for discharge. Patient/family member advised to call PCP or come back to ER if needed.   Principal Diagnosis: Non-cardiac chest pain  Discharge Diagnoses: Active Hospital Problems   Diagnosis Date Noted   Non-cardiac chest pain 03/26/2011    Priority: 1.   Dysphagia 03/09/2024     Priority: 1.   Controlled type 2 diabetes mellitus without complication, without long-term current use of insulin  (HCC) 08/06/2007    Priority: 4.   Benign esophageal stricture 03/10/2024   Gastric ulcer without hemorrhage or perforation 03/10/2024   Incidental pulmonary nodule 03/09/2024   COPD (chronic obstructive pulmonary disease) (HCC) 02/13/2023   GERD 08/06/2007   Hyperlipidemia 09/03/2006    Resolved Hospital Problems  No resolved problems to display.     Discharge Instructions     Diet general   Complete by: As directed    Increase activity slowly   Complete by: As directed       Allergies as of 03/11/2024       Reactions   Zestril  [lisinopril ] Cough   Apresoline  [hydralazine ] Anxiety, Palpitations, Other (See Comments)   Flushing  Tachycardia    Calan [verapamil] Other (See Comments)   Foot pain   Codeine  Nausea Only        Medication List     TAKE these medications    acetaminophen  500 MG tablet Commonly known as: TYLENOL  Take 500-1,000 mg by mouth See admin instructions. Take 1-2 tablets (500-1000mg ) by mouth in the morning and at night. May take an additional 1-2 tablets mid-day if needed for pain.   albuterol  108 (90 Base) MCG/ACT inhaler Commonly known as: VENTOLIN  HFA Inhale 1 puff into the lungs every 4 (four) hours as needed.   amLODipine  5 MG tablet Commonly known as: NORVASC  Take 1 tablet (5 mg total) by mouth daily.   aspirin  EC 81 MG tablet Take 1 tablet (81 mg total) by mouth daily. Swallow whole.   CRANBERRY PO Take 2 capsules by mouth 2 (two) times daily.   estradiol  0.1 MG/GM vaginal cream Commonly known as: ESTRACE  VAGINAL Apply vaginally Vaginal 2-3 times per week to help with urinary symptoms What changed:  when to take this additional instructions   fluticasone  50 MCG/ACT nasal spray Commonly known as: FLONASE  Shake liquid and place 1 spray into both nostrils daily. What changed: when to take this    HYDROcodone -acetaminophen  5-325 MG tablet Commonly known as: NORCO/VICODIN Take 1 tablet by mouth 2 (two) times daily as needed for severe pain (pain score 7-10).   hydrOXYzine  10 MG tablet Commonly known as: ATARAX  Take 1 tablet (10 mg total) by mouth daily as needed for anxiety.   pantoprazole  40 MG tablet Commonly known as: PROTONIX  Take 1 tablet twice a day for 8 weeks (until 05/06/24) then 1 tablet daily   polyethylene glycol 17 g packet Commonly known as: MIRALAX  / GLYCOLAX  Take 17 g by mouth daily as needed.   RED WINE COMPLEX PO Take 1 capsule by mouth daily.   Stool Softener/Laxative 50-8.6 MG tablet Generic drug: senna-docusate Take 1 tablet by mouth 2 (two) times daily.   VISINE OP Place 1 drop into both eyes 2 (two) times daily as needed (dry eyes).        Allergies  Allergen Reactions   Zestril  [Lisinopril ] Cough   Apresoline  [Hydralazine ] Anxiety, Palpitations and Other (See Comments)    Flushing  Tachycardia    Calan [Verapamil] Other (See Comments)    Foot pain   Codeine  Nausea  Only    Consultations: GI  Procedures: 8/13: EGD  Discharge Exam: BP 117/83   Pulse 75   Temp 97.6 F (36.4 C) (Oral)   Resp 16   Ht 5' 1 (1.549 m)   Wt 47.6 kg   LMP  (LMP Unknown)   SpO2 95%   BMI 19.84 kg/m  Physical Exam Constitutional:      Appearance: Normal appearance.  HENT:     Head: Normocephalic and atraumatic.     Mouth/Throat:     Mouth: Mucous membranes are moist.  Eyes:     Extraocular Movements: Extraocular movements intact.  Cardiovascular:     Rate and Rhythm: Normal rate and regular rhythm.  Pulmonary:     Effort: Pulmonary effort is normal. No respiratory distress.     Breath sounds: Normal breath sounds. No wheezing.  Abdominal:     General: Bowel sounds are normal. There is no distension.     Palpations: Abdomen is soft.     Tenderness: There is no abdominal tenderness.  Musculoskeletal:        General: Normal range of motion.      Cervical back: Normal range of motion and neck supple.  Skin:    General: Skin is warm and dry.  Neurological:     General: No focal deficit present.     Mental Status: She is alert.  Psychiatric:        Mood and Affect: Mood normal.      The results of significant diagnostics from this hospitalization (including imaging, microbiology, ancillary and laboratory) are listed below for reference.   Microbiology: No results found for this or any previous visit (from the past 240 hours).   Labs: BNP (last 3 results) Recent Labs    03/08/24 1407  BNP 103.2*   Basic Metabolic Panel: Recent Labs  Lab 03/08/24 1407 03/09/24 0514 03/10/24 1000  NA 141 141 139  K 4.2 3.8 3.5  CL 104 108 106  CO2 23 21* 22  GLUCOSE 187* 95 106*  BUN 28* 20 12  CREATININE 0.88 0.75 0.65  CALCIUM 9.6 8.7* 8.6*   Liver Function Tests: Recent Labs  Lab 03/08/24 1407 03/09/24 0514 03/10/24 1000  AST 20 25 30   ALT 16 9 21   ALKPHOS 72 69 64  BILITOT 1.7* 2.1* 2.0*  PROT 7.5 7.0 6.9  ALBUMIN  3.9 3.6 3.5   Recent Labs  Lab 03/08/24 1407  LIPASE 56*   No results for input(s): AMMONIA in the last 168 hours. CBC: Recent Labs  Lab 03/08/24 1407 03/10/24 1000  WBC 8.8 7.6  HGB 15.9* 14.8  HCT 48.6* 46.2*  MCV 91.7 92.6  PLT 304 262   Cardiac Enzymes: No results for input(s): CKTOTAL, CKMB, CKMBINDEX, TROPONINI in the last 168 hours. BNP: Invalid input(s): POCBNP CBG: Recent Labs  Lab 03/10/24 1140 03/10/24 2004 03/11/24 0007 03/11/24 0416 03/11/24 0742  GLUCAP 101* 226* 92 89 90   D-Dimer No results for input(s): DDIMER in the last 72 hours. Hgb A1c Recent Labs    03/09/24 0514  HGBA1C 6.5*   Lipid Profile No results for input(s): CHOL, HDL, LDLCALC, TRIG, CHOLHDL, LDLDIRECT in the last 72 hours. Thyroid  function studies No results for input(s): TSH, T4TOTAL, T3FREE, THYROIDAB in the last 72 hours.  Invalid input(s):  FREET3 Anemia work up No results for input(s): VITAMINB12, FOLATE, FERRITIN, TIBC, IRON, RETICCTPCT in the last 72 hours. Urinalysis    Component Value Date/Time   COLORURINE STRAW (A) 08/07/2023 1538  APPEARANCEUR CLOUDY (A) 08/07/2023 1538   LABSPEC 1.006 08/07/2023 1538   PHURINE 7.0 08/07/2023 1538   GLUCOSEU NEGATIVE 08/07/2023 1538   HGBUR NEGATIVE 08/07/2023 1538   HGBUR negative 08/02/2009 1512   BILIRUBINUR NEGATIVE 08/07/2023 1538   BILIRUBINUR negative 08/25/2019 1154   BILIRUBINUR Negative 12/16/2017 1115   KETONESUR NEGATIVE 08/07/2023 1538   PROTEINUR NEGATIVE 08/07/2023 1538   UROBILINOGEN 0.2 08/25/2019 1154   UROBILINOGEN 0.2 12/12/2014 1623   NITRITE NEGATIVE 08/07/2023 1538   LEUKOCYTESUR LARGE (A) 08/07/2023 1538   Sepsis Labs Recent Labs  Lab 03/08/24 1407 03/10/24 1000  WBC 8.8 7.6   Microbiology No results found for this or any previous visit (from the past 240 hours).  Procedures/Studies: DG ESOPHAGUS W SINGLE CM (SOL OR THIN BA) Result Date: 03/09/2024 CLINICAL DATA:  89 year old with dysphagia and dilated esophagus on chest CT. Clinical concern for achalasia. EXAM: ESOPHOGRAM/BARIUM SWALLOW TECHNIQUE: Single contrast examination was performed using  thin barium. FLUOROSCOPY: Radiation Exposure Index (as provided by the fluoroscopic device): 11.7 mGy Kerma COMPARISON:  Chest radiographs 08/07/2023. Chest CT 03/08/2024 and 10/18/2022. FINDINGS: Single-contrast examination was performed with the patient in the semi erect, supine and right lateral decubitus positions. Patient's limited mobility precludes a double contrast study. The patient swallowed the barium without difficulty. No laryngeal penetration or aspiration was observed. The esophagus appears only mildly dilated, improved from yesterday's CT. There is esophageal dysmotility with a decreased primary stripping wave and barium stasis in the semi erect and right lateral decubitus  positions. There is narrowing of the esophageal lumen at the gastroesophageal junction with a persistent filling defect measuring approximately 1.2 cm in diameter. This may correspond with a dense filling defect on recent CT, although the persistence would argue against a pill fragment. No other intraluminal filling defects or mucosal abnormalities are identified. A 13 mm barium tablet was administered and passed without significant delay into the distal esophagus. In the semi erect position, passage into the stomach was not demonstrated despite multiple swallows of water  over an approximately 4 minute interval. IMPRESSION: 1. Moderate esophageal dysmotility with a decreased primary stripping wave and barium stasis. Typical signs of achalasia are not demonstrated. 2. Probable distal esophageal stricture, not optimally evaluated. 3. Persistent indeterminate filling defect in the distal esophagus as seen on recent CTA, not clearly a retained pill fragment. Consider fibrovascular polyp or other mucosal lesion. Recommend endoscopy or short-term follow-up barium study to assess for persistence. Electronically Signed   By: Elsie Perone M.D.   On: 03/09/2024 13:40   CT Angio Chest/Abd/Pel for Dissection W and/or Wo Contrast Result Date: 03/08/2024 CLINICAL DATA:  Chest pain and vomiting, initial encounter EXAM: CT ANGIOGRAPHY CHEST, ABDOMEN AND PELVIS TECHNIQUE: Non-contrast CT of the chest was initially obtained. Multidetector CT imaging through the chest, abdomen and pelvis was performed using the standard protocol during bolus administration of intravenous contrast. Multiplanar reconstructed images and MIPs were obtained and reviewed to evaluate the vascular anatomy. RADIATION DOSE REDUCTION: This exam was performed according to the departmental dose-optimization program which includes automated exposure control, adjustment of the mA and/or kV according to patient size and/or use of iterative reconstruction  technique. CONTRAST:  OMNIPAQUE  IOHEXOL  350 MG/ML SOLN COMPARISON:  08/07/2023 FINDINGS: CTA CHEST FINDINGS Cardiovascular: Precontrast images demonstrate atherosclerotic calcifications of the thoracic aorta. No aneurysmal dilatation is seen. No hyperdense crescent to suggest acute aortic injury is noted. Post-contrast images show the left vertebral artery to arise directly from the aorta. No evidence of dissection  is noted. No aneurysmal dilatation is seen. Heart is mildly enlarged in size. The pulmonary artery shows a normal branching pattern without intraluminal filling defect to suggest pulmonary embolism. Mediastinum/Nodes: Thoracic inlet is within normal limits. No hilar or mediastinal adenopathy is noted. The esophagus is dilated throughout its course and filled with fluid. A radiopaque density is noted at the gastroesophageal junction likely representing a an ingested tablet. This may also be related to a degree of achalasia. Lungs/Pleura: Lungs are well aerated bilaterally. Emphysematous changes are seen. Diffuse subpleural fibrotic changes are noted bilaterally. Few scattered tiny parenchymal nodules are noted measuring less than 5 mm. No specific follow-up is recommended. No focal confluent infiltrate or effusion is seen. Musculoskeletal: Degenerative changes of the thoracic spine are seen. Chronic appearing T12 compression deformity is noted. Review of the MIP images confirms the above findings. CTA ABDOMEN AND PELVIS FINDINGS VASCULAR Aorta: Atherosclerotic calcifications are noted without aneurysmal dilatation. Celiac: Patent without evidence of aneurysm, dissection, vasculitis or significant stenosis. SMA: Patent without evidence of aneurysm, dissection, vasculitis or significant stenosis. Renals: Single renal artery is noted on the right. The left artery is not appreciated due to prior left nephrectomy. IMA: Within normal limits. Inflow: Iliacs demonstrate atherosclerotic calcification without  acute abnormality. Veins: No specific venous abnormality is noted. Review of the MIP images confirms the above findings. NON-VASCULAR Hepatobiliary: No focal liver abnormality is seen. No gallstones, gallbladder wall thickening, or biliary dilatation. Pancreas: Unremarkable. No pancreatic ductal dilatation or surrounding inflammatory changes. Spleen: Normal in size without focal abnormality. Adrenals/Urinary Tract: Adrenal glands are within normal limits. Left kidney has been surgically removed. Right kidney is well visualized and within normal limits. The bladder is well distended. Stomach/Bowel: Scattered diverticular change of the colon is noted without evidence of diverticulitis. No obstructive changes of the colon are seen. Fecal material is noted throughout without obstructive change. The appendix is within normal limits. Small bowel and stomach are unremarkable. Lymphatic: No lymphadenopathy is noted. Reproductive: Uterus and bilateral adnexa are unremarkable. Other: No abdominal wall hernia or abnormality. No abdominopelvic ascites. Musculoskeletal: Right hip replacement is noted. No acute bony abnormality is seen. Review of the MIP images confirms the above findings. IMPRESSION: CTA of the chest: No evidence of aortic injury. No aneurysmal dilatation or dissection is noted. Esophageal dilatation with a radiopaque density just above the gastroesophageal junction which may represent a retained pill. Considerable amount of fluid is noted within the esophagus which corresponds with the given clinical history. This may represent a degree of achalasia. Scattered small less than 5 mm parenchymal nodules. No specific follow-up is recommended. CTA of the abdomen and pelvis: No acute aortic abnormality is noted. Diverticulosis without diverticulitis. No acute abnormality noted. Electronically Signed   By: Oneil Devonshire M.D.   On: 03/08/2024 19:48     Time coordinating discharge: Over 30 minutes    Alm Apo, MD  [facility] 03/11/2024, 2:10 PM

## 2024-03-11 NOTE — Progress Notes (Incomplete)
 Discharge medication delivered to patient at bedside [facility]

## 2024-03-12 DIAGNOSIS — I6523 Occlusion and stenosis of bilateral carotid arteries: Secondary | ICD-10-CM | POA: Diagnosis not present

## 2024-03-12 DIAGNOSIS — S199XXA Unspecified injury of neck, initial encounter: Secondary | ICD-10-CM | POA: Diagnosis not present

## 2024-03-12 DIAGNOSIS — M4312 Spondylolisthesis, cervical region: Secondary | ICD-10-CM | POA: Diagnosis not present

## 2024-03-12 DIAGNOSIS — S0990XA Unspecified injury of head, initial encounter: Secondary | ICD-10-CM | POA: Diagnosis not present

## 2024-03-12 DIAGNOSIS — R58 Hemorrhage, not elsewhere classified: Secondary | ICD-10-CM | POA: Diagnosis not present

## 2024-03-12 DIAGNOSIS — S0101XA Laceration without foreign body of scalp, initial encounter: Secondary | ICD-10-CM | POA: Diagnosis not present

## 2024-03-12 DIAGNOSIS — S6991XA Unspecified injury of right wrist, hand and finger(s), initial encounter: Secondary | ICD-10-CM | POA: Diagnosis not present

## 2024-03-12 DIAGNOSIS — S61314A Laceration without foreign body of right ring finger with damage to nail, initial encounter: Secondary | ICD-10-CM | POA: Diagnosis not present

## 2024-03-12 DIAGNOSIS — M4802 Spinal stenosis, cervical region: Secondary | ICD-10-CM | POA: Diagnosis not present

## 2024-03-12 DIAGNOSIS — W19XXXA Unspecified fall, initial encounter: Secondary | ICD-10-CM | POA: Diagnosis not present

## 2024-03-12 DIAGNOSIS — M19041 Primary osteoarthritis, right hand: Secondary | ICD-10-CM | POA: Diagnosis not present

## 2024-03-12 DIAGNOSIS — S61214A Laceration without foreign body of right ring finger without damage to nail, initial encounter: Secondary | ICD-10-CM | POA: Diagnosis not present

## 2024-03-12 DIAGNOSIS — M47812 Spondylosis without myelopathy or radiculopathy, cervical region: Secondary | ICD-10-CM | POA: Diagnosis not present

## 2024-03-12 LAB — SURGICAL PATHOLOGY

## 2024-03-12 NOTE — Anesthesia Postprocedure Evaluation (Signed)
 Anesthesia Post Note  Patient: Alexa [name]  Procedure(s) Performed: EGD (ESOPHAGOGASTRODUODENOSCOPY)     Patient location during evaluation: Endoscopy Anesthesia Type: MAC Level of consciousness: awake and alert Pain management: pain level controlled Vital Signs Assessment: post-procedure vital signs reviewed and stable Respiratory status: spontaneous breathing and respiratory function stable Cardiovascular status: blood pressure returned to baseline and stable Postop Assessment: spinal receding Anesthetic complications: no   No notable events documented.  Last Vitals:  Vitals:   03/11/24 0815 03/11/24 0939  BP: 117/83 117/83  Pulse:    Resp: 16   Temp: 36.4 C   SpO2: 95%     Last Pain:  Vitals:   03/11/24 0844  TempSrc:   PainSc: 6                  Norvell Caswell L Lagina Reader

## 2024-03-12 NOTE — ED Provider Notes (Signed)
 Patient placed in First Look pathway, seen and evaluated for chief complaint of fall.  Pt had a mechanical fall and struck back of her head on the coffee table, no LOC or blood thinner use.  With EMS in the ED, pt injured R ring finger.  Pertinent exam findings include NAD.   Patient/parent counseled on process, plan, and necessity for staying for completing the evaluation.      Note By: Peyton Gammons, PA-C 4:59 PM   [facility] Emergency Department Physician Note  ED Course - HPI and Medical Decision Making  Chief Complaint: Head Laceration and Finger Laceration   History is obtained from Patient and Spouse HPI: This is a 89 y.o. female who presents to the emergency department with fall, head laceration, and finger laceration. Patient reports that she lost her balance and fell backwards and hit the back of her head on her glass coffee table around 4 PM today. After the fall she started bleeding from a deep laceration on the back of her head but was eventually able to control the bleeding with EMS. She denies any loss of consciousness after hitting her head. Patient and EMS report that as EMS were wheeling the patient into the ED on a stretcher, her right hand banged into either a door frame or wall with a lot of force and her right fourth finger started to bleed profusely. It was later noted that she had a deep cut to her right ring finger that they were able to get to stop bleeding with pressure and gauze. She reports that she was discharged from the hospital yesterday for a procedure to stretch her esophagus. She denies any chest pain, shortness of breath, nausea, vomiting, or loss of consciousness before or after her fall.  MDM and Assessment: -Differential diagnosis includes, but not limited to: DDX: strain, sprain, fracture, contusion, dislocation, hematoma, solid organ injury, SAH, epidural hematoma, laceration, hollow viscous organ injury,  abrasion  -Initial plan: Place patient on telemetry with continuous pulse oximeter reading and vital signs per emergency protocol, CT Head to evaluate for acute intracranial abnormality including SAH, IPH, SDH, masses, hydrocephalus, and CT Cervical spine to evaluate for fracture and/or traumatic malalignment    -Patient presents with mechanical fall and posterior scalp injury. No LOC, CT head without acute intracranial abnormality or skull fx. Tetanus is up todate. Posterior scalp lac was closed with 1 staple -Unfortunately during transport her finger was smashed by EMS, sustaining a through and through laceration through the pad, split the finger nail; we discussed options of finger nail removal and reimplant, however patient declined, we closed to the best of our ability with 3 sutures of 5-0 nylon to the pad and applied glue along the nail to approximate that nail injury. Xr of the hand showed no underlying fx.  -On reevaluation, patient appears well. Afebrile, hemodynamically stable, sPO2 stable, and is stable for discharge home. Follow-up was recommended and return precautions discussed.     Medical Decision Making Problems Addressed: Laceration of right ring finger without foreign body with damage to nail, initial encounter: complicated acute illness or injury Laceration of scalp, initial encounter: complicated acute illness or injury  Amount and/or Complexity of Data Reviewed Independent Historian: spouse External Data Reviewed: notes.    Details: Reviewed cardiology office visit from 06/19/2023 for shortness of breath and dyspnea on exertion.  Radiology: ordered and independent interpretation performed.  Risk Prescription drug management.    Factors Impacting ED Encounter Complexity: Increase  Complexity: Decision regarding hospitalization or escalation of hospital level care and Discussion regarding prescription drug management  Impression  Patient's presentation is most  consistent with acute presentation with potential threat to life or bodily function. 1. Laceration of right ring finger without foreign body with damage to nail, initial encounter   2. Laceration of scalp, initial encounter    ED Disposition     ED Disposition  Discharge   Condition  Stable   Comment  --        Follow-up No follow-up provider specified. Review of Systems  A pertinent review of systems was obtained and was negative except as noted in the HPI and MDM. Physical Exam   ED Triage Vitals [03/12/24 1643]  Temp 98.1 F (36.7 C)  Heart Rate 71  Resp 15  BP (!) 167/82  MAP (mmHg) 105  SpO2 100 %  O2 Device None (Room air)  O2 Flow Rate (L/min)   Weight 47.6 kg (105 lb)   Physical Exam  Constitutional Nursing notes reviewed Vital signs reviewed  HEENT No obvious trauma Supple without meningismus Pupils cross midline No scleral icterus or injection  Respiratory Effort normal CTAB No respiratory distress  CV Normal rate No pitting edema 2+ radial and DP pulses  Abdomen Soft Non-tender Non-distended No peritonitis  MSK 1 cm laceration to posterior scalp  Severe arthritic changes of fingers Right fourth finger has a full thickness laceration through the pad and nail bed of finger ROM appropriate  Skin Warm Dry  Neuro Awake and alert Pupils cross midline Moving all extremities  Psychiatric Mood and affect normal    Other pertinent exam findings per MDM-ED Course Procedures  Laceration repair  Date/Time: 03/12/2024 4:40 PM  Performed by: Merilee Levander Maffucci, MD Authorized by: Merilee Levander Maffucci, MD   Consent:    Consent obtained:  Verbal   Consent given by:  Patient   Risks, benefits, and alternatives were discussed: yes     Risks discussed:  Infection, pain, need for additional repair and poor wound healing   Alternatives discussed:  No treatment Universal protocol:    Procedure explained and questions answered to patient or proxy's  satisfaction: yes     Relevant documents present and verified: yes     Imaging studies available: yes     Required blood products, implants, devices, and special equipment available: yes     Site/side marked: yes     Immediately prior to procedure, a time out was called: yes     Patient identity confirmed:  Verbally with patient, hospital-assigned identification number and arm band Anesthesia:    Anesthesia method:  Local infiltration   Local anesthetic:  Lidocaine  1% w/o epi Laceration details:    Location:  Finger   Finger location:  R ring finger   Length (cm):  1 Pre-procedure details:    Preparation:  Patient was prepped and draped in usual sterile fashion Exploration:    Limited defect created (wound extended): no     Hemostasis achieved with:  Direct pressure   Imaging obtained: x-ray     Imaging outcome: foreign body not noted     Wound extent comment:  Nail bed injury   Contaminated: no   Treatment:    Area cleansed with:  Saline and povidone-iodine    Amount of cleaning:  Standard   Irrigation solution:  Sterile saline   Irrigation volume:  50 mL   Irrigation method:  Syringe   Visualized foreign bodies/material removed: no  Debridement:  None   Undermining:  None   Scar revision: no   Skin repair:    Repair method:  Sutures and tissue adhesive   Suture size:  6-0   Suture material:  Nylon   Suture technique:  Simple interrupted   Number of sutures:  3 Approximation:    Approximation:  Close Repair type:    Repair type:  Simple Post-procedure details:    Dressing:  Antibiotic ointment and non-adherent dressing   Procedure completion:  Tolerated well, no immediate complications Laceration repair  Date/Time: 03/12/2024 4:40 PM  Performed by: Merilee Levander Maffucci, MD Authorized by: Merilee Levander Maffucci, MD   Consent:    Consent obtained:  Verbal   Consent given by:  Patient   Risks, benefits, and alternatives were discussed: yes     Risks discussed:   Infection, pain and poor cosmetic result   Alternatives discussed:  No treatment and delayed treatment Universal protocol:    Procedure explained and questions answered to patient or proxy's satisfaction: yes     Relevant documents present and verified: yes     Imaging studies available: yes     Required blood products, implants, devices, and special equipment available: yes     Site/side marked: yes     Immediately prior to procedure, a time out was called: yes     Patient identity confirmed:  Verbally with patient Anesthesia:    Anesthesia method:  None Laceration details:    Location:  Scalp   Scalp location:  Crown   Length (cm):  1 Pre-procedure details:    Preparation:  Patient was prepped and draped in usual sterile fashion Exploration:    Limited defect created (wound extended): no     Hemostasis achieved with:  Direct pressure   Wound exploration: wound explored through full range of motion and entire depth of wound visualized     Wound extent: no areolar tissue violation noted, no fascia violation noted, no foreign bodies/material noted, no muscle damage noted, no nerve damage noted, no tendon damage noted, no underlying fracture noted and no vascular damage noted   Treatment:    Area cleansed with:  Povidone-iodine  and saline   Amount of cleaning:  Standard   Irrigation solution:  Sterile saline   Irrigation volume:  50 mL   Irrigation method:  Syringe   Visualized foreign bodies/material removed: no   Skin repair:    Repair method:  Staples   Number of staples:  1 Approximation:    Approximation:  Close Repair type:    Repair type:  Simple Post-procedure details:    Dressing:  Antibiotic ointment and non-adherent dressing   Procedure completion:  Tolerated well, no immediate complications    This document serves as a record of services personally performed by Merilee Levander Maffucci, MD. It was created on their behalf by Karena LOISE Hurst, Scribe, a trained medical  scribe. The creation of this record is the provider's dictation and/or activities during the visit.   Electronically signed by: Karena LOISE Hurst, Scribe 03/12/2024 6:24 PM

## 2024-03-15 ENCOUNTER — Ambulatory Visit: Payer: Self-pay | Admitting: Gastroenterology

## 2024-03-15 DIAGNOSIS — B9681 Helicobacter pylori [H. pylori] as the cause of diseases classified elsewhere: Secondary | ICD-10-CM

## 2024-03-15 MED ORDER — BISMUTH SUBSALICYLATE 262 MG PO CHEW: 524.0000 mg | CHEWABLE_TABLET | Freq: Four times a day (QID) | ORAL | 0 refills | Status: AC

## 2024-03-15 MED ORDER — METRONIDAZOLE 250 MG PO TABS: 250.0000 mg | ORAL_TABLET | Freq: Four times a day (QID) | ORAL | 0 refills | Status: AC

## 2024-03-15 MED ORDER — DOXYCYCLINE HYCLATE 100 MG PO TABS: 100.0000 mg | ORAL_TABLET | Freq: Two times a day (BID) | ORAL | 0 refills | Status: AC

## 2024-03-15 NOTE — Progress Notes (Signed)
 [name], The biopsies from your recent upper GI Endoscopy were notable for H. Pylori gastritis.  H. pylori is a common bacteria which can cause chronic symptoms of abdominal pain, nausea and bloating.  It can also cause iron deficiency anemia as well as increase the risk of stomach ulcers and stomach cancer.  We need to eradicate the bacteria.  Sometimes the bacteria is very difficult to eradicate, so it is  important to take the medications as directed. Please take the medications below:  1) Pantoprazole  40 mg 2 times a day x 14 d 2) Pepto Bismol 2 tabs (262 mg each) 4 times a day x 14 d 3) Metronidazole  250 mg 4 times a day x 14 d 4) doxycycline  100 mg 2 times a day x 14 d  4 weeks after treatment completed, check H. Pylori stool PCR test ([facility]) to confirm eradication   Dx: H. Pylori gastritis  The biopsies of the narrowing at the end of your esophagus showed benign inflammatory changes, but no evidence of cancer or precancerous changes.

## 2024-03-19 DIAGNOSIS — Z4802 Encounter for removal of sutures: Secondary | ICD-10-CM | POA: Diagnosis not present

## 2024-03-19 DIAGNOSIS — S61411D Laceration without foreign body of right hand, subsequent encounter: Secondary | ICD-10-CM | POA: Diagnosis not present

## 2024-03-19 NOTE — ED Provider Notes (Signed)
 [facility] Emergency Department Emergency Department Provider Note  This document was created using the aid of voice recognition Dragon dictation software.   Provider at bedside: 03/19/2024 11:37 AM  History obtained from the: Patient  History   Chief Complaint  Patient presents with  . Suture / Staple Removal     History provided by:  Patient Language interpreter used: No     Alexa [name] is a 89 y.o. female who presents to the ED with complaints of suture and staple removal.  Patient was seen in the ED on 8/15 after a fall where she had staples placed to scalp laceration and sutures placed to the laceration in her right ring finger.  She presents today for suture and staple removal.  She feels that these wounds are healing well but does have some mild pain in the affected finger.  She is hypertensive in the ED but states that she gets this way when she is upset and she is upset about what she has gone through with the past week with her fall.  No headaches, changes in vision, chest pain and no other reported complaints.  ______________________ ROS: Pertinent positives and negatives per HPI.  Physical Exam   Vitals:   03/19/24 1057 03/19/24 1143  BP: (!) 191/92 154/88  BP Location: Right arm Left arm  Patient Position: Sitting Sitting  Pulse: 76 72  Resp: 16 16  Temp: 97.7 F (36.5 C) 98.2 F (36.8 C)  TempSrc: Oral Oral  SpO2: 97% 97%  Weight: 47.6 kg (105 lb)   Height: 154.9 cm (5' 1)     Physical Exam Constitutional:      Appearance: Normal appearance. She is not toxic-appearing.  HENT:     Head: Normocephalic and atraumatic.  Eyes:     Comments: Small laceration to the posterior scalp with 1 staple in place -wound is clean, dry and intact without surrounding redness.  There is also a laceration to the distal aspect of the right ring finger with overlying scab and 3 sutures in place.  Also no surrounding redness, purulent discharge or increased  warmth, range of motion of the finger intact.  The nail is split down the entire length  Cardiovascular:     Rate and Rhythm: Normal rate.  Pulmonary:     Effort: Pulmonary effort is normal.  Skin:    General: Skin is warm and dry.  Neurological:     Mental Status: She is alert.  Psychiatric:        Mood and Affect: Mood normal.     Results  Suture removal  Date/Time: 03/19/2024 10:53 AM  Performed by: Richerd Jenkins Lines, PA-C Authorized by: Beverley Lynwood Goltz, MD   Consent:    Consent obtained:  Verbal   Consent given by:  Patient   Risks, benefits, and alternatives were discussed: yes     Risks discussed:  Bleeding, pain and wound separation   Alternatives discussed:  No treatment and alternative treatment Universal protocol:    Procedure explained and questions answered to patient or proxy's satisfaction: yes     Patient identity confirmed:  Verbally with patient Location:    Location:  Head/neck   Head/neck location:  Scalp Procedure details:    Wound appearance:  No signs of infection, good wound healing and clean   Number of staples removed:  1 Post-procedure details:    Post-removal:  No dressing applied   Procedure completion:  Tolerated well, no immediate complications Suture removal  Date/Time: 03/19/2024 10:53 AM  Performed by: Richerd Jenkins Lines, PA-C Authorized by: Beverley Lynwood Goltz, MD   Consent:    Consent obtained:  Verbal   Consent given by:  Patient   Risks, benefits, and alternatives were discussed: yes     Risks discussed:  Bleeding and pain   Alternatives discussed:  No treatment and alternative treatment Universal protocol:    Procedure explained and questions answered to patient or proxy's satisfaction: yes     Patient identity confirmed:  Verbally with patient Location:    Location:  Upper extremity   Upper extremity location:  Hand   Hand location:  R ring finger Procedure details:    Wound appearance:  No signs of infection,  good wound healing and clean   Number of sutures removed:  3 Post-procedure details:    Post-removal:  No dressing applied   Procedure completion:  Tolerated well, no immediate complications    ED Course     Medical Decision Making    DDX: Suture removal, staple removal, wound infection  Clinical Complexity  Patient's presentation is most consistent with acute presentation with potential threat to life or bodily function.  Patient's age increases the complexity of managing their  presentation with staple/suture removal.    Provider time spent in patient care today, inclusive of but not limited to clinical reassessment, review of diagnostic studies, and discharge preparation, was greater than 30 minutes.   All Imaging and lab work personally viewed and interpreted by myself. My interpretations are as follow:  Patient brought in for staple/suture removal as above.  She is hypertensive but states this is because she upset.  She is asymptomatic regarding this and BP improved without intervention.  Her recent wounds appear to be healing well without evidence of surrounding infection.  Staple and sutures were removed as above.  Patient stable for discharge.  Recommended that she keep a close eye on her blood pressure and return for any new or worsening symptoms.  Also recommend she follow closely with PCP regarding this.  Discussed strict ED return precautions  Medical Decision Making Problems Addressed: Encounter for removal of sutures: acute illness or injury Encounter for staple removal: acute illness or injury    ED Clinical Impression   1. Encounter for staple removal   2. Encounter for removal of sutures    FOLLOW UP No follow-up provider specified.  ED Disposition     ED Disposition  Discharge   Condition  Stable   Comment  --        _____________________________

## 2024-03-19 NOTE — ED Triage Notes (Signed)
 Pt here for suture and staple removal. Pt had them placed on 8/15 at this facility. Denies signs of infection. + pain to the finger where the sutures are placed.

## 2024-03-26 DIAGNOSIS — M810 Age-related osteoporosis without current pathological fracture: Secondary | ICD-10-CM | POA: Diagnosis not present

## 2024-03-26 DIAGNOSIS — A048 Other specified bacterial intestinal infections: Secondary | ICD-10-CM | POA: Diagnosis not present

## 2024-03-26 DIAGNOSIS — M545 Low back pain, unspecified: Secondary | ICD-10-CM | POA: Diagnosis not present

## 2024-03-26 DIAGNOSIS — N1831 Chronic kidney disease, stage 3a: Secondary | ICD-10-CM | POA: Diagnosis not present

## 2024-03-26 DIAGNOSIS — R2681 Unsteadiness on feet: Secondary | ICD-10-CM | POA: Diagnosis not present

## 2024-03-26 DIAGNOSIS — F419 Anxiety disorder, unspecified: Secondary | ICD-10-CM | POA: Diagnosis not present

## 2024-03-26 DIAGNOSIS — E611 Iron deficiency: Secondary | ICD-10-CM | POA: Diagnosis not present

## 2024-03-26 DIAGNOSIS — I479 Paroxysmal tachycardia, unspecified: Secondary | ICD-10-CM | POA: Diagnosis not present

## 2024-03-26 DIAGNOSIS — Z515 Encounter for palliative care: Secondary | ICD-10-CM | POA: Diagnosis not present

## 2024-03-26 DIAGNOSIS — E1122 Type 2 diabetes mellitus with diabetic chronic kidney disease: Secondary | ICD-10-CM | POA: Diagnosis not present

## 2024-03-26 DIAGNOSIS — F0390 Unspecified dementia without behavioral disturbance: Secondary | ICD-10-CM | POA: Diagnosis not present

## 2024-03-26 DIAGNOSIS — I10 Essential (primary) hypertension: Secondary | ICD-10-CM | POA: Diagnosis not present

## 2024-03-31 DIAGNOSIS — M4802 Spinal stenosis, cervical region: Secondary | ICD-10-CM | POA: Diagnosis not present

## 2024-03-31 DIAGNOSIS — I129 Hypertensive chronic kidney disease with stage 1 through stage 4 chronic kidney disease, or unspecified chronic kidney disease: Secondary | ICD-10-CM | POA: Diagnosis not present

## 2024-03-31 DIAGNOSIS — M879 Osteonecrosis, unspecified: Secondary | ICD-10-CM | POA: Diagnosis not present

## 2024-03-31 DIAGNOSIS — N1831 Chronic kidney disease, stage 3a: Secondary | ICD-10-CM | POA: Diagnosis not present

## 2024-03-31 DIAGNOSIS — F0393 Unspecified dementia, unspecified severity, with mood disturbance: Secondary | ICD-10-CM | POA: Diagnosis not present

## 2024-03-31 DIAGNOSIS — M810 Age-related osteoporosis without current pathological fracture: Secondary | ICD-10-CM | POA: Diagnosis not present

## 2024-03-31 DIAGNOSIS — M5416 Radiculopathy, lumbar region: Secondary | ICD-10-CM | POA: Diagnosis not present

## 2024-03-31 DIAGNOSIS — I479 Paroxysmal tachycardia, unspecified: Secondary | ICD-10-CM | POA: Diagnosis not present

## 2024-03-31 DIAGNOSIS — F32A Depression, unspecified: Secondary | ICD-10-CM | POA: Diagnosis not present

## 2024-03-31 DIAGNOSIS — E1122 Type 2 diabetes mellitus with diabetic chronic kidney disease: Secondary | ICD-10-CM | POA: Diagnosis not present

## 2024-03-31 DIAGNOSIS — F0394 Unspecified dementia, unspecified severity, with anxiety: Secondary | ICD-10-CM | POA: Diagnosis not present

## 2024-03-31 DIAGNOSIS — J4489 Other specified chronic obstructive pulmonary disease: Secondary | ICD-10-CM | POA: Diagnosis not present

## 2024-04-01 NOTE — Progress Notes (Unsigned)
 Cardiology Office Note:   Date:  04/02/2024  ID:  Alexa [name], DOB November 11, 1929, MRN [id number] PCP: Valentin Skates, DO  [facility] Providers Cardiologist:  Lynwood Schilling, MD {  History of Present Illness:   Alexa [name] is a 89 y.o. female  who presents for evaluation of SOB. She was in the hospital last month most recently.  She had UTI, weakness and CKDIIIa.  The plan was for her to go home with hospice.      I saw her in 2012.  She had a fall but not syncope which was the question.  She had an echo in 2018 for evaluation of SOB.  There was moderate concentric LVH with normal EF.   I saw her had she had palpitations early this week.  She wore a monitor and she had no sustained arrhythmias.   She had brief runs of SVT.  She was in the hospital in August.  She had dysphagia.  She had esophageal stricture that was dilated.   She has home health nurses to come to the house.  Apparently somebody noticed that her heart rate was in the 150s when they came to check on her and she did not really feel this.  She does not have presyncope or syncope.  She did not have chest discomfort.  EMS was not called and she did not want to go to the doctor.  She really has not noticed this much.  She gets around with her walker.  She is not having any new presyncope or syncope.  She did have a fall and hit her head and was in the emergency room.  She is not having any chest discomfort.  She denies any acute shortness of breath, PND or orthopnea.  ROS: As stated in the HPI and negative for all other systems.  Studies Reviewed:    EKG:     NA  Risk Assessment/Calculations:              Physical Exam:   VS:  BP 128/78   Pulse 73   Ht 5' 1 (1.549 m)   Wt 107 lb 6.4 oz (48.7 kg)   LMP  (LMP Unknown)   SpO2 98%   BMI 20.29 kg/m    Wt Readings from Last 3 Encounters:  04/02/24 107 lb 6.4 oz (48.7 kg)  03/10/24 105 lb (47.6 kg)  08/06/23 109 lb 2 oz (49.5 kg)     GEN: Well nourished, well  developed in no acute distress NECK: No JVD; No carotid bruits CARDIAC: RRR, no murmurs, rubs, gallops RESPIRATORY:  Clear to auscultation without rales, wheezing or rhonchi  ABDOMEN: Soft, non-tender, non-distended EXTREMITIES:  Mild edema; No deformity   ASSESSMENT AND PLAN:   Palpitations: She does not really notice any palpitations.  She did have some tachycardia and has had multifocal atrial tachycardia in the past.  I am gena give her metoprolol  12-1/2 mg as needed rapid heart rate.  If she is having to use this a lot I discussed with her and her son she could start taking this on a scheduled basis twice a day and let us  know if she is needing to do that.   Elevated troponin: This was noted when she was in the hospital previously but she is not having any anginal symptoms and we are going to pursue conservative management.     Hypertension: Her blood pressure is at target.  No change in therapy.  Follow up with APP in six months  Signed, Lynwood Schilling, MD

## 2024-04-02 ENCOUNTER — Encounter: Payer: Self-pay | Admitting: Cardiology

## 2024-04-02 ENCOUNTER — Ambulatory Visit: Attending: Cardiology | Admitting: Cardiology

## 2024-04-02 VITALS — BP 128/78 | HR 73 | Ht 61.0 in | Wt 107.4 lb

## 2024-04-02 DIAGNOSIS — I10 Essential (primary) hypertension: Secondary | ICD-10-CM | POA: Diagnosis not present

## 2024-04-02 DIAGNOSIS — R7989 Other specified abnormal findings of blood chemistry: Secondary | ICD-10-CM

## 2024-04-02 DIAGNOSIS — R0602 Shortness of breath: Secondary | ICD-10-CM | POA: Diagnosis not present

## 2024-04-02 DIAGNOSIS — R002 Palpitations: Secondary | ICD-10-CM | POA: Diagnosis not present

## 2024-04-02 MED ORDER — METOPROLOL TARTRATE 25 MG PO TABS: 12.5000 mg | ORAL_TABLET | ORAL | 3 refills | Status: AC | PRN

## 2024-04-02 NOTE — Patient Instructions (Addendum)
 Medication Instructions:  Metoprolol  12.5 mg as needed for rapid heartrate *If you need a refill on your cardiac medications before your next appointment, please call your pharmacy*  Lab Work: NONE If you have labs (blood work) drawn today and your tests are completely normal, you will receive your results only by: MyChart Message (if you have MyChart) OR A paper copy in the mail If you have any lab test that is abnormal or we need to change your treatment, we will call you to review the results.  Testing/Procedures: NONE  Follow-Up: At [facility], you and your health needs are our priority.  As part of our continuing mission to provide you with exceptional heart care, our providers are all part of one team.  This team includes your primary Cardiologist (physician) and Advanced Practice Providers or APPs (Physician Assistants and Nurse Practitioners) who all work together to provide you with the care you need, when you need it.  Your next appointment:   6 months  Provider:   APP  We recommend signing up for the patient portal called MyChart.  Sign up information is provided on this After Visit Summary.  MyChart is used to connect with patients for Virtual Visits (Telemedicine).  Patients are able to view lab/test results, encounter notes, upcoming appointments, etc.  Non-urgent messages can be sent to your provider as well.   To learn more about what you can do with MyChart, go to [email or web address].

## 2024-04-26 NOTE — Telephone Encounter (Signed)
 Contacted patient by phone to remind her that she needs to have H Pylori stool retest to make sure we have eradicated H Pylori with the medications sent for her 03/15/24. Patient states that she was never told she had any bacteria in her stomach and that she was never told there were medicines she needed to take. I again explained H Pylori and the reason we need to have her take antibiotics to eliminate it. Advised rx was sent on 03/15/24 with breakdown instructions sent to MyChart as well. Patient continues to repeat that she was never told of any information. Later states that she is 73 and forgets a lot of things. Patient describes some medications she got at the pharmacy and says they were red pills but they made her mess on myself so stopped taking them. She is unable to give me the name of the medication. Patient has a difficult time moving forward with the conversation.  I offered to contact the pharmacy to ask that they fill her H Pylori regimen again and discussed that she would need to pick these up and begin taking them. She says she does not have a way to get the medications. She does state that her brother, Alexa [name] helps her sometimes and has given permission to speak with him.  I attempted to reach Ron but had to leave a voicemail asking him to call our office back.

## 2024-04-28 NOTE — Telephone Encounter (Signed)
 Left additional message for Ron, patient's brother to call our office.

## 2024-05-10 ENCOUNTER — Other Ambulatory Visit (HOSPITAL_COMMUNITY): Payer: Self-pay

## 2024-05-10 ENCOUNTER — Other Ambulatory Visit: Payer: Self-pay

## 2024-05-12 DIAGNOSIS — Z1389 Encounter for screening for other disorder: Secondary | ICD-10-CM | POA: Diagnosis not present

## 2024-05-12 DIAGNOSIS — E7849 Other hyperlipidemia: Secondary | ICD-10-CM | POA: Diagnosis not present

## 2024-05-12 DIAGNOSIS — N1831 Chronic kidney disease, stage 3a: Secondary | ICD-10-CM | POA: Diagnosis not present

## 2024-05-12 DIAGNOSIS — E1122 Type 2 diabetes mellitus with diabetic chronic kidney disease: Secondary | ICD-10-CM | POA: Diagnosis not present

## 2024-05-12 DIAGNOSIS — E559 Vitamin D deficiency, unspecified: Secondary | ICD-10-CM | POA: Diagnosis not present

## 2024-05-12 DIAGNOSIS — I129 Hypertensive chronic kidney disease with stage 1 through stage 4 chronic kidney disease, or unspecified chronic kidney disease: Secondary | ICD-10-CM | POA: Diagnosis not present

## 2024-05-19 ENCOUNTER — Other Ambulatory Visit (HOSPITAL_COMMUNITY): Payer: Self-pay

## 2024-05-19 DIAGNOSIS — E611 Iron deficiency: Secondary | ICD-10-CM | POA: Diagnosis not present

## 2024-05-19 DIAGNOSIS — I479 Paroxysmal tachycardia, unspecified: Secondary | ICD-10-CM | POA: Diagnosis not present

## 2024-05-19 DIAGNOSIS — Z1331 Encounter for screening for depression: Secondary | ICD-10-CM | POA: Diagnosis not present

## 2024-05-19 DIAGNOSIS — M810 Age-related osteoporosis without current pathological fracture: Secondary | ICD-10-CM | POA: Diagnosis not present

## 2024-05-19 DIAGNOSIS — Z91018 Allergy to other foods: Secondary | ICD-10-CM | POA: Diagnosis not present

## 2024-05-19 DIAGNOSIS — K5900 Constipation, unspecified: Secondary | ICD-10-CM | POA: Diagnosis not present

## 2024-05-19 DIAGNOSIS — E1122 Type 2 diabetes mellitus with diabetic chronic kidney disease: Secondary | ICD-10-CM | POA: Diagnosis not present

## 2024-05-19 DIAGNOSIS — Z0000 Encounter for general adult medical examination without abnormal findings: Secondary | ICD-10-CM | POA: Diagnosis not present

## 2024-05-19 DIAGNOSIS — M545 Low back pain, unspecified: Secondary | ICD-10-CM | POA: Diagnosis not present

## 2024-05-19 DIAGNOSIS — I10 Essential (primary) hypertension: Secondary | ICD-10-CM | POA: Diagnosis not present

## 2024-05-19 DIAGNOSIS — K222 Esophageal obstruction: Secondary | ICD-10-CM | POA: Diagnosis not present

## 2024-05-19 DIAGNOSIS — D6859 Other primary thrombophilia: Secondary | ICD-10-CM | POA: Diagnosis not present

## 2024-05-19 DIAGNOSIS — R82998 Other abnormal findings in urine: Secondary | ICD-10-CM | POA: Diagnosis not present

## 2024-05-19 DIAGNOSIS — N1831 Chronic kidney disease, stage 3a: Secondary | ICD-10-CM | POA: Diagnosis not present

## 2024-05-19 DIAGNOSIS — A048 Other specified bacterial intestinal infections: Secondary | ICD-10-CM | POA: Diagnosis not present

## 2024-05-19 DIAGNOSIS — Z23 Encounter for immunization: Secondary | ICD-10-CM | POA: Diagnosis not present

## 2024-05-19 MED ORDER — METFORMIN HCL 500 MG PO TABS
500.0000 mg | ORAL_TABLET | Freq: Every day | ORAL | 3 refills | Status: DC
  Filled 2024-05-19: qty 90, 90d supply, fill #0

## 2024-05-19 MED ORDER — FT STOMACH RELIEF 262 MG PO TABS
524.0000 mg | ORAL_TABLET | Freq: Four times a day (QID) | ORAL | 0 refills | Status: AC
  Filled 2024-05-19: qty 112, 14d supply, fill #0

## 2024-05-19 MED ORDER — PANTOPRAZOLE SODIUM 40 MG PO TBEC
40.0000 mg | DELAYED_RELEASE_TABLET | Freq: Two times a day (BID) | ORAL | 0 refills | Status: AC
  Filled 2024-05-19: qty 28, 14d supply, fill #0

## 2024-05-19 MED ORDER — METRONIDAZOLE 250 MG PO TABS
250.0000 mg | ORAL_TABLET | Freq: Four times a day (QID) | ORAL | 0 refills | Status: AC
  Filled 2024-05-19: qty 56, 14d supply, fill #0

## 2024-05-19 MED ORDER — ASPIRIN 81 MG PO TBEC
81.0000 mg | DELAYED_RELEASE_TABLET | Freq: Every day | ORAL | 3 refills | Status: AC
  Filled 2024-05-19: qty 90, 90d supply, fill #0

## 2024-05-19 MED ORDER — DOXYCYCLINE HYCLATE 100 MG PO TABS
100.0000 mg | ORAL_TABLET | Freq: Two times a day (BID) | ORAL | 0 refills | Status: AC
  Filled 2024-05-19: qty 28, 14d supply, fill #0

## 2024-05-20 ENCOUNTER — Encounter: Payer: Self-pay | Admitting: Pharmacist

## 2024-05-20 ENCOUNTER — Other Ambulatory Visit (HOSPITAL_COMMUNITY): Payer: Self-pay

## 2024-05-20 ENCOUNTER — Other Ambulatory Visit: Payer: Self-pay

## 2024-05-21 ENCOUNTER — Other Ambulatory Visit: Payer: Self-pay

## 2024-05-21 ENCOUNTER — Other Ambulatory Visit (HOSPITAL_COMMUNITY): Payer: Self-pay

## 2024-05-26 ENCOUNTER — Emergency Department (HOSPITAL_COMMUNITY)

## 2024-05-26 ENCOUNTER — Other Ambulatory Visit: Payer: Self-pay

## 2024-05-26 ENCOUNTER — Emergency Department (HOSPITAL_COMMUNITY)
Admission: EM | Admit: 2024-05-26 | Discharge: 2024-05-27 | Disposition: A | Discharge status: Home / Self Care | Attending: Emergency Medicine | Admitting: Emergency Medicine

## 2024-05-26 DIAGNOSIS — Z7982 Long term (current) use of aspirin: Secondary | ICD-10-CM | POA: Diagnosis not present

## 2024-05-26 DIAGNOSIS — R195 Other fecal abnormalities: Secondary | ICD-10-CM | POA: Diagnosis not present

## 2024-05-26 DIAGNOSIS — R000 Tachycardia, unspecified: Secondary | ICD-10-CM | POA: Diagnosis not present

## 2024-05-26 DIAGNOSIS — R112 Nausea with vomiting, unspecified: Secondary | ICD-10-CM | POA: Diagnosis not present

## 2024-05-26 DIAGNOSIS — K5900 Constipation, unspecified: Secondary | ICD-10-CM | POA: Diagnosis not present

## 2024-05-26 LAB — LIPASE, BLOOD: Lipase: 57 U/L — ABNORMAL HIGH (ref 11–51)

## 2024-05-26 LAB — CBC WITH DIFFERENTIAL/PLATELET
Abs Immature Granulocytes: 0.03 K/uL (ref 0.00–0.07)
Basophils Absolute: 0.1 K/uL (ref 0.0–0.1)
Basophils Relative: 1 %
Eosinophils Absolute: 0.2 K/uL (ref 0.0–0.5)
Eosinophils Relative: 2 %
HCT: 46.8 % — ABNORMAL HIGH (ref 36.0–46.0)
Hemoglobin: 14.9 g/dL (ref 12.0–15.0)
Immature Granulocytes: 0 %
Lymphocytes Relative: 22 %
Lymphs Abs: 2 K/uL (ref 0.7–4.0)
MCH: 28.4 pg (ref 26.0–34.0)
MCHC: 31.8 g/dL (ref 30.0–36.0)
MCV: 89.1 fL (ref 80.0–100.0)
Monocytes Absolute: 0.4 K/uL (ref 0.1–1.0)
Monocytes Relative: 4 %
Neutro Abs: 6.3 K/uL (ref 1.7–7.7)
Neutrophils Relative %: 71 %
Platelets: 298 K/uL (ref 150–400)
RBC: 5.25 MIL/uL — ABNORMAL HIGH (ref 3.87–5.11)
RDW: 13.3 % (ref 11.5–15.5)
WBC: 9.1 K/uL (ref 4.0–10.5)
nRBC: 0 % (ref 0.0–0.2)

## 2024-05-26 LAB — URINALYSIS, ROUTINE W REFLEX MICROSCOPIC
Bilirubin Urine: NEGATIVE
Glucose, UA: NEGATIVE mg/dL
Hgb urine dipstick: NEGATIVE
Ketones, ur: 5 mg/dL — AB
Nitrite: NEGATIVE
Protein, ur: 30 mg/dL — AB
Specific Gravity, Urine: 1.014 (ref 1.005–1.030)
pH: 5 (ref 5.0–8.0)

## 2024-05-26 LAB — COMPREHENSIVE METABOLIC PANEL WITH GFR
ALT: 11 U/L (ref 0–44)
AST: 27 U/L (ref 15–41)
Albumin: 4.2 g/dL (ref 3.5–5.0)
Alkaline Phosphatase: 71 U/L (ref 38–126)
Anion gap: 15 (ref 5–15)
BUN: 19 mg/dL (ref 8–23)
CO2: 22 mmol/L (ref 22–32)
Calcium: 9.7 mg/dL (ref 8.9–10.3)
Chloride: 100 mmol/L (ref 98–111)
Creatinine, Ser: 0.89 mg/dL (ref 0.44–1.00)
GFR, Estimated: >60 mL/min (ref >60)
Glucose, Bld: 123 mg/dL — ABNORMAL HIGH (ref 70–99)
Potassium: 4.3 mmol/L (ref 3.5–5.1)
Sodium: 138 mmol/L (ref 135–145)
Total Bilirubin: 0.9 mg/dL (ref 0.0–1.2)
Total Protein: 7.5 g/dL (ref 6.5–8.1)

## 2024-05-26 MED ORDER — LABETALOL HCL 5 MG/ML IV SOLN
5.0000 mg | Freq: Once | INTRAVENOUS | Status: AC
  Administered 2024-05-26: 5 mg via INTRAVENOUS
  Filled 2024-05-26: qty 4

## 2024-05-26 MED ORDER — GLYCERIN (ADULT) 2 G RE SUPP: 1.0000 | RECTAL | 0 refills | Status: AC | PRN

## 2024-05-26 MED ORDER — FAMOTIDINE IN NACL 20-0.9 MG/50ML-% IV SOLN
20.0000 mg | Freq: Once | INTRAVENOUS | Status: AC
  Administered 2024-05-26: 20 mg via INTRAVENOUS
  Filled 2024-05-26: qty 50

## 2024-05-26 MED ORDER — GLYCERIN (LAXATIVE) 2 G RE SUPP
1.0000 | Freq: Once | RECTAL | Status: AC
  Administered 2024-05-26: 1 via RECTAL
  Filled 2024-05-26: qty 1

## 2024-05-26 MED ORDER — ESTRADIOL 0.1 MG/GM VA CREA: TOPICAL_CREAM | VAGINAL | 3 refills | Status: AC

## 2024-05-26 MED ORDER — ONDANSETRON HCL 4 MG/2ML IJ SOLN
4.0000 mg | Freq: Once | INTRAMUSCULAR | Status: AC
  Administered 2024-05-26: 4 mg via INTRAVENOUS
  Filled 2024-05-26: qty 2

## 2024-05-26 NOTE — ED Triage Notes (Signed)
 Pt BIB EMS from home c/o N/V for 2 days, constipation for 4 days unrelieved by laxities. Recently had esophageal surgery. Afib hx. No distention, no tenderness.  Chills but no fever   EMS vitals  BP 178/88 HR 74 irregular  SPO2  CBG 186 Temp 97.2

## 2024-05-26 NOTE — ED Provider Notes (Addendum)
 [facility] EMERGENCY DEPARTMENT AT [facility] Provider Note   CSN: [id number] Arrival date & time: 05/26/24  1434     Patient presents with: Abdominal Pain   Alexa [name] is a 89 y.o. female.   HPI   89 year old female presents emergency department with concern for decreased appetite, nausea as well as constipation ongoing for the past 4 days.  Patient has had difficulty with constipation her whole life.  Recently she had an endoscopy where she had an esophageal stricture that was treated, they also identified H. pylori and she has been put on triple therapy.  She states since then she has had difficulty with constipation, not relieved with her typical treatment therapies.  She denies any significant abdominal distention.  She is still passing gas.  No history of obstruction.  Prior to Admission medications   Medication Sig Start Date End Date Taking? Authorizing Provider  acetaminophen  (TYLENOL ) 500 MG tablet Take 500-1,000 mg by mouth See admin instructions. Take 1-2 tablets (500-1000mg ) by mouth in the morning and at night. May take an additional 1-2 tablets mid-day if needed for pain.    [provider]  albuterol  (VENTOLIN  HFA) 108 (90 Base) MCG/ACT inhaler Inhale 1 puff into the lungs every 4 (four) hours as needed. 07/24/23   Valentin Skates, DO  amLODipine  (NORVASC ) 5 MG tablet Take 1 tablet (5 mg total) by mouth daily. 06/23/23   Valentin Skates, DO  aspirin  EC (ASPIRIN  81) 81 MG tablet Take 1 tablet (81 mg total) by mouth daily. 05/19/24     aspirin  EC 81 MG tablet Take 1 tablet (81 mg total) by mouth daily. Swallow whole. 08/08/23   Shalhoub, Zachary PARAS, MD  Bismuth  Subsalicylate (FT STOMACH RELIEF) 262 MG TABS Take 2 tablets (524 mg total) by mouth 4 (four) times daily. 05/19/24     CRANBERRY PO Take 2 capsules by mouth 2 (two) times daily.    [provider]  doxycycline  (VIBRA -TABS) 100 MG tablet Take 1 tablet (100 mg total) by mouth 2 (two) times  daily. 05/19/24     estradiol  (ESTRACE  VAGINAL) 0.1 MG/GM vaginal cream Apply vaginally Vaginal 2-3 times per week to help with urinary symptoms Patient taking differently: Place vaginally See admin instructions. Apply a small amount vaginally every other night 09/11/23     fluticasone  (FLONASE ) 50 MCG/ACT nasal spray Shake liquid and place 1 spray into both nostrils daily. Patient taking differently: Place 1 spray into both nostrils 2 (two) times daily. 09/08/23   Valentin Skates, DO  HYDROcodone -acetaminophen  (NORCO/VICODIN) 5-325 MG tablet Take 1 tablet by mouth 2 (two) times daily as needed for severe pain (pain score 7-10).    [provider]  hydrOXYzine  (ATARAX ) 10 MG tablet Take 1 tablet (10 mg total) by mouth daily as needed for anxiety. 04/10/23     metFORMIN  (GLUCOPHAGE ) 500 MG tablet Take 1 tablet (500 mg total) by mouth daily with a meal. 05/19/24     metoprolol  tartrate (LOPRESSOR ) 25 MG tablet Take 0.5 tablets (12.5 mg total) by mouth as needed (Take a half tablet (12.5 mg) daily as needed for rapid heartrate). 04/02/24   Lavona Agent, MD  metroNIDAZOLE  (FLAGYL ) 250 MG tablet Take 1 tablet (250 mg total) by mouth 4 (four) times daily. 05/19/24     Misc Natural Products (RED WINE COMPLEX PO) Take 1 capsule by mouth daily.    [provider]  Naphazoline-Pheniramine (VISINE OP) Place 1 drop into both eyes 2 (two) times daily as  needed (dry eyes).    [provider]  pantoprazole  (PROTONIX ) 40 MG tablet Take 1 tablet twice a day for 8 weeks (until 05/06/24) then 1 tablet daily 03/11/24   Patsy Lenis, MD  pantoprazole  (PROTONIX ) 40 MG tablet Take 1 tablet (40 mg total) by mouth 2 (two) times daily for two weeks, then restart once daily. 05/19/24     polyethylene glycol (MIRALAX  / GLYCOLAX ) 17 g packet Take 17 g by mouth daily as needed. 03/11/24   Patsy Lenis, MD  senna-docusate (SENOKOT-S) 8.6-50 MG tablet Take 1 tablet by mouth 2 (two) times daily. 03/11/24    Patsy Lenis, MD    Allergies: Zestril  [lisinopril ], Apresoline  [hydralazine ], Calan [verapamil], and Codeine     Review of Systems  Constitutional:  Positive for appetite change, chills and fatigue. Negative for fever.  Respiratory:  Negative for shortness of breath.   Cardiovascular:  Negative for chest pain.  Gastrointestinal:  Positive for constipation and nausea. Negative for abdominal pain, blood in stool and diarrhea.  Genitourinary:  Negative for dysuria.  Skin:  Negative for rash.  Neurological:  Negative for headaches.    Updated Vital Signs BP (!) 147/93 (BP Location: Right Arm)   Pulse 95   Temp 98.8 F (37.1 C) (Oral)   Resp 17   Ht 5' 1 (1.549 m)   Wt 47.6 kg   LMP  (LMP Unknown)   SpO2 95%   BMI 19.84 kg/m   Physical Exam Vitals and nursing note reviewed.  Constitutional:      General: She is not in acute distress.    Appearance: Normal appearance. She is not ill-appearing.  HENT:     Head: Normocephalic.     Mouth/Throat:     Mouth: Mucous membranes are moist.  Cardiovascular:     Rate and Rhythm: Normal rate.  Pulmonary:     Effort: Pulmonary effort is normal. No respiratory distress.  Abdominal:     General: Bowel sounds are normal. There is no distension.     Palpations: Abdomen is soft.     Tenderness: There is no abdominal tenderness. There is no guarding or rebound.  Skin:    General: Skin is warm.  Neurological:     Mental Status: She is alert and oriented to person, place, and time. Mental status is at baseline.  Psychiatric:        Mood and Affect: Mood normal.     (all labs ordered are listed, but only abnormal results are displayed) Labs Reviewed  CBC WITH DIFFERENTIAL/PLATELET  COMPREHENSIVE METABOLIC PANEL WITH GFR  URINALYSIS, ROUTINE W REFLEX MICROSCOPIC    EKG: None  Radiology: No results found.   Procedures   Medications Ordered in the ED  ondansetron  (ZOFRAN ) injection 4 mg (has no administration in time range)   famotidine  (PEPCID ) IVPB 20 mg premix (has no administration in time range)                                    Medical Decision Making Amount and/or Complexity of Data Reviewed Labs: ordered. Radiology: ordered.  Risk OTC drugs. Prescription drug management.   89 year old female presents emergency department after episodes of nausea/vomiting, now complaining of constipation.  Recently had an endoscopy for esophageal stricture, H. pylori noted, patient on triple therapy.  Vitals are normal and stable on arrival.  Blood work shows no leukocytosis, lipase is slightly elevated but baseline, abdominal labs are  normal.  X-ray imaging shows no findings of obstruction but does note mild stool burden.  Patient requesting enema, this was offered.  She was able to have a small bowel movement.  Discussed with the patient continuing bowel regimen including increased MiraLAX , fluids and suppositories.  She is agreeable to this.  Otherwise abdomen has remains benign.  Patient at this time appears safe and stable for discharge and close outpatient follow up. Discharge plan and strict return to ED precautions discussed, patient verbalizes understanding and agreement.   Of note patient had an episode of sinus tachycardia prior to discharge.  She has documented paroxysmal palpitations with a recent evaluation by cardiology.  She has been prescribed as needed metoprolol  to take as needed.  EKG shows sinus rhythm, with PACs.  Otherwise is stable.  On reevaluation heart rate is normal.  Final diagnoses:  None    ED Discharge Orders     None          Bari Roxie HERO, DO 05/26/24 2218    Bari Roxie HERO, DO 05/26/24 2306    Caidyn Blossom, Roxie HERO, DO 05/26/24 2308

## 2024-05-26 NOTE — Discharge Instructions (Signed)
 You have been seen and discharged from the emergency department.  You were found to have constipation.  Increase MiraLAX  intake to up to 3 capfuls a day, stay well-hydrated.  Use glycerin suppositories as directed for relief.  Follow-up with your primary provider for further evaluation and further care. Take home medications as prescribed. If you have any worsening symptoms or further concerns for your health please return to an emergency department for further evaluation.

## 2024-05-27 ENCOUNTER — Other Ambulatory Visit (HOSPITAL_COMMUNITY): Payer: Self-pay

## 2024-05-27 NOTE — ED Notes (Signed)
 Contacted [facility] for [facility] transport of Pt

## 2024-06-07 ENCOUNTER — Other Ambulatory Visit: Payer: Self-pay

## 2024-06-11 ENCOUNTER — Other Ambulatory Visit (HOSPITAL_COMMUNITY): Payer: Self-pay

## 2024-06-16 DIAGNOSIS — N952 Postmenopausal atrophic vaginitis: Secondary | ICD-10-CM | POA: Diagnosis not present

## 2024-06-16 DIAGNOSIS — R310 Gross hematuria: Secondary | ICD-10-CM | POA: Diagnosis not present

## 2024-06-28 ENCOUNTER — Other Ambulatory Visit (HOSPITAL_COMMUNITY): Payer: Self-pay

## 2024-06-28 MED ORDER — HYDROCODONE-ACETAMINOPHEN 5-325 MG PO TABS
1.0000 | ORAL_TABLET | Freq: Every day | ORAL | 0 refills | Status: AC | PRN
  Filled 2024-06-28 – 2024-07-01 (×2): qty 30, 30d supply, fill #0

## 2024-06-29 ENCOUNTER — Other Ambulatory Visit (HOSPITAL_COMMUNITY): Payer: Self-pay

## 2024-07-01 ENCOUNTER — Other Ambulatory Visit: Payer: Self-pay

## 2024-07-01 ENCOUNTER — Other Ambulatory Visit (HOSPITAL_COMMUNITY): Payer: Self-pay

## 2024-07-01 MED ORDER — SENNA-DOCUSATE SODIUM 8.6-50 MG PO TABS
1.0000 | ORAL_TABLET | Freq: Two times a day (BID) | ORAL | 3 refills | Status: AC
  Filled 2024-07-01: qty 120, 60d supply, fill #0

## 2024-07-01 MED ORDER — ACETAMINOPHEN EXTRA STRENGTH 500 MG PO TABS
500.0000 mg | ORAL_TABLET | Freq: Three times a day (TID) | ORAL | 2 refills | Status: AC | PRN
  Filled 2024-07-01: qty 200, 34d supply, fill #0

## 2024-07-01 MED ORDER — POLYETHYLENE GLYCOL 3350 17 G PO PACK
PACK | ORAL | 1 refills | Status: AC
  Filled 2024-07-01: qty 60, 30d supply, fill #0

## 2024-07-12 ENCOUNTER — Telehealth: Payer: Self-pay | Admitting: Cardiology

## 2024-07-12 NOTE — Telephone Encounter (Signed)
 Called patient back about message. Patient stated that her toes to her calf have bilateral swelling. Patient stated she has not changed anything and it just happened all of a sudden. Talked to patient a while she had Japanese take out yesterday and today that her family had brought her. Informed patient that restaurant food is full of salt and this might be the reason she has so much swelling. Patient stated she has an appointment with her PCP tomorrow. Encouraged patient to elevate her feet and keep her appointment tomorrow with her PCP. Informed patient if her symptoms got worse or she got SOB to call 911. Review alternative healthy choices when eating. Patient verbalized understanding. Made an appointment for her follow-up in March.

## 2024-07-12 NOTE — Telephone Encounter (Signed)
 Pt c/o swelling/edema: STAT if pt has developed SOB within 24 hours  If swelling, where is the swelling located? Legs and feet   How much weight have you gained and in what time span? About the same, she states she weigh around 100lbs, last at 04/02/24 - 107lbs   Have you gained 2 pounds in a day or 5 pounds in a week? No   Do you have a log of your daily weights (if so, list)? No   Are you currently taking a fluid pill? No   Are you currently SOB?   Have you traveled recently in a car or plane for an extended period of time? No

## 2024-07-16 ENCOUNTER — Other Ambulatory Visit (HOSPITAL_COMMUNITY): Payer: Self-pay | Admitting: Internal Medicine

## 2024-07-16 ENCOUNTER — Ambulatory Visit (HOSPITAL_COMMUNITY)
Admission: RE | Admit: 2024-07-16 | Discharge: 2024-07-16 | Disposition: A | Discharge status: Home / Self Care | Source: Ambulatory Visit | Attending: Vascular Surgery | Admitting: Vascular Surgery

## 2024-07-16 DIAGNOSIS — R600 Localized edema: Secondary | ICD-10-CM

## 2024-07-16 MED ORDER — FUROSEMIDE 20 MG PO TABS: 20.0000 mg | ORAL_TABLET | Freq: Every day | ORAL | 2 refills | Status: AC | PRN

## 2024-07-16 NOTE — Telephone Encounter (Signed)
 Patient called Monday 12/15 and spoke with triage nurse about the swelling. She has been eating takeout brought by family and was educated on excess salt intake and fluid retention, educated on healthier options and elevating legs.  Calling back today to report: Saw PCP this week, LE ultrasound ordered to r/o DVT. Performed today, no DVT. Swelling is no better/no worse today. Has been present for 1 week now. She does report new onset SOB with activity this week. EF 50-55% on echo performed in November 2024. Currently taking amlodipine  5 mg daily, prescribed by PCP (office now closed). Does not have any diuretics prescribed.  Dr. Lavona is currently out of the office. Will forward to DOD for recommendations.

## 2024-07-16 NOTE — Telephone Encounter (Signed)
 Patient is calling back because she is still swollen, calling to see what the Dr can give her to help. Please advise

## 2024-07-16 NOTE — Addendum Note (Signed)
 Addended by: VICCI ROXIE CROME on: 07/16/2024 04:21 PM   Modules accepted: Orders

## 2024-07-16 NOTE — Telephone Encounter (Signed)
 Discussed phone note with Dr. Santo (DOD), he recommends: Lasix  20 PO PRN; BMP and BNP in one week with results to Primary Cardiologist; APP f/u as per Dr. Denver note.  Spoke with patient about the above recommendations. Patient repeatedly states she is afraid of having a stroke. Reviewed stroke s/sx with her and if she develops any of them to call 911 immediately. Patient verbalized understanding.  Rx for Lasix  sent to [facility] pharmacy. Labs ordered and released. Patient will plan to have BMP, BNP drawn next week on 12/26.

## 2024-07-18 ENCOUNTER — Telehealth: Payer: Self-pay

## 2024-07-18 NOTE — Telephone Encounter (Signed)
"  ° °  Received a page that patient has swollen feet and as not urinated since 12/20. Looking for advice.   On chart review, noticed patient had called in earlier this week for peripheral edema and new SOB. She was prescribed lasix  20mg   Reported that her peripheral edema has improved, though still present. She is worried about taking a lasix  dose today because she has noticed a decrease in her urine output from normal. She has a history of one kidney.  I advised patient to present to the ED for further evaluation. Caller verbalized understanding and was grateful for the call back.  Leontine LOISE Salen, PA-C 07/18/2024, 1:03 PM   "

## 2024-07-28 ENCOUNTER — Other Ambulatory Visit: Payer: Self-pay

## 2024-08-26 ENCOUNTER — Other Ambulatory Visit (HOSPITAL_COMMUNITY): Payer: Self-pay

## 2024-10-01 ENCOUNTER — Ambulatory Visit: Admitting: Emergency Medicine
